# Patient Record
Sex: Male | Born: 1935 | Race: Black or African American | Hispanic: No | Smoking: Current every day smoker
Health system: Southern US, Community
[De-identification: ages and names within clinical notes are randomized; demographics above are authoritative.]

## PROBLEM LIST (undated history)

## (undated) DIAGNOSIS — E785 Hyperlipidemia, unspecified: Secondary | ICD-10-CM

## (undated) DIAGNOSIS — I739 Peripheral vascular disease, unspecified: Secondary | ICD-10-CM

## (undated) DIAGNOSIS — N183 Chronic kidney disease, stage 3 unspecified: Secondary | ICD-10-CM

## (undated) DIAGNOSIS — I951 Orthostatic hypotension: Secondary | ICD-10-CM

## (undated) DIAGNOSIS — E039 Hypothyroidism, unspecified: Secondary | ICD-10-CM

## (undated) DIAGNOSIS — J449 Chronic obstructive pulmonary disease, unspecified: Secondary | ICD-10-CM

## (undated) DIAGNOSIS — R55 Syncope and collapse: Secondary | ICD-10-CM

## (undated) DIAGNOSIS — R0989 Other specified symptoms and signs involving the circulatory and respiratory systems: Secondary | ICD-10-CM

## (undated) DIAGNOSIS — I714 Abdominal aortic aneurysm, without rupture, unspecified: Secondary | ICD-10-CM

## (undated) DIAGNOSIS — I10 Essential (primary) hypertension: Secondary | ICD-10-CM

## (undated) DIAGNOSIS — I2510 Atherosclerotic heart disease of native coronary artery without angina pectoris: Secondary | ICD-10-CM

## (undated) DIAGNOSIS — I639 Cerebral infarction, unspecified: Secondary | ICD-10-CM

## (undated) DIAGNOSIS — R519 Headache, unspecified: Secondary | ICD-10-CM

## (undated) DIAGNOSIS — I495 Sick sinus syndrome: Secondary | ICD-10-CM

## (undated) DIAGNOSIS — Z950 Presence of cardiac pacemaker: Secondary | ICD-10-CM

## (undated) DIAGNOSIS — J189 Pneumonia, unspecified organism: Secondary | ICD-10-CM

## (undated) DIAGNOSIS — R918 Other nonspecific abnormal finding of lung field: Secondary | ICD-10-CM

## (undated) DIAGNOSIS — G459 Transient cerebral ischemic attack, unspecified: Secondary | ICD-10-CM

## (undated) DIAGNOSIS — R51 Headache: Secondary | ICD-10-CM

## (undated) DIAGNOSIS — I5022 Chronic systolic (congestive) heart failure: Secondary | ICD-10-CM

## (undated) HISTORY — DX: Other specified symptoms and signs involving the circulatory and respiratory systems: R09.89

## (undated) HISTORY — DX: Hyperlipidemia, unspecified: E78.5

## (undated) HISTORY — PX: INSERT / REPLACE / REMOVE PACEMAKER: SUR710

## (undated) HISTORY — DX: Peripheral vascular disease, unspecified: I73.9

## (undated) HISTORY — PX: CATARACT EXTRACTION W/ INTRAOCULAR LENS  IMPLANT, BILATERAL: SHX1307

## (undated) HISTORY — DX: Chronic obstructive pulmonary disease, unspecified: J44.9

## (undated) HISTORY — DX: Syncope and collapse: R55

## (undated) HISTORY — DX: Sick sinus syndrome: I49.5

## (undated) HISTORY — PX: CORONARY ANGIOPLASTY WITH STENT PLACEMENT: SHX49

## (undated) HISTORY — DX: Cerebral infarction, unspecified: I63.9

## (undated) HISTORY — DX: Hypothyroidism, unspecified: E03.9

## (undated) HISTORY — DX: Abdominal aortic aneurysm, without rupture: I71.4

## (undated) HISTORY — PX: COLONOSCOPY: SHX174

## (undated) HISTORY — PX: OTHER SURGICAL HISTORY: SHX169

## (undated) HISTORY — PX: FEMORAL BYPASS: SHX50

## (undated) HISTORY — DX: Orthostatic hypotension: I95.1

## (undated) HISTORY — DX: Abdominal aortic aneurysm, without rupture, unspecified: I71.40

---

## 1979-08-07 HISTORY — PX: INGUINAL HERNIA REPAIR: SUR1180

## 1998-06-02 ENCOUNTER — Inpatient Hospital Stay (HOSPITAL_COMMUNITY): Admission: EM | Admit: 1998-06-02 | Discharge: 1998-06-04 | Payer: Self-pay | Admitting: Emergency Medicine

## 1998-12-06 HISTORY — PX: CARDIAC CATHETERIZATION: SHX172

## 1998-12-06 HISTORY — PX: CORONARY ARTERY BYPASS GRAFT: SHX141

## 1999-01-15 ENCOUNTER — Ambulatory Visit (HOSPITAL_COMMUNITY): Admission: RE | Admit: 1999-01-15 | Discharge: 1999-01-15 | Payer: Self-pay | Admitting: Cardiology

## 1999-01-20 ENCOUNTER — Encounter: Payer: Self-pay | Admitting: Thoracic Surgery (Cardiothoracic Vascular Surgery)

## 1999-01-20 ENCOUNTER — Inpatient Hospital Stay (HOSPITAL_COMMUNITY)
Admission: RE | Admit: 1999-01-20 | Discharge: 1999-01-24 | Payer: Self-pay | Admitting: Thoracic Surgery (Cardiothoracic Vascular Surgery)

## 1999-01-21 ENCOUNTER — Encounter: Payer: Self-pay | Admitting: Thoracic Surgery (Cardiothoracic Vascular Surgery)

## 1999-01-22 ENCOUNTER — Encounter: Payer: Self-pay | Admitting: Thoracic Surgery (Cardiothoracic Vascular Surgery)

## 1999-01-23 ENCOUNTER — Encounter: Payer: Self-pay | Admitting: Cardiology

## 1999-01-24 ENCOUNTER — Encounter: Payer: Self-pay | Admitting: Thoracic Surgery (Cardiothoracic Vascular Surgery)

## 2000-07-08 ENCOUNTER — Inpatient Hospital Stay (HOSPITAL_COMMUNITY): Admission: EM | Admit: 2000-07-08 | Discharge: 2000-07-12 | Payer: Self-pay | Admitting: Cardiovascular Disease

## 2000-07-08 ENCOUNTER — Encounter: Payer: Self-pay | Admitting: Cardiology

## 2000-07-15 ENCOUNTER — Ambulatory Visit (HOSPITAL_COMMUNITY): Admission: RE | Admit: 2000-07-15 | Discharge: 2000-07-15 | Payer: Self-pay | Admitting: Internal Medicine

## 2001-02-21 ENCOUNTER — Ambulatory Visit (HOSPITAL_COMMUNITY): Admission: RE | Admit: 2001-02-21 | Discharge: 2001-02-21 | Payer: Self-pay | Admitting: Cardiovascular Disease

## 2001-07-10 ENCOUNTER — Encounter: Admission: RE | Admit: 2001-07-10 | Discharge: 2001-07-10 | Payer: Self-pay | Admitting: Family Medicine

## 2001-07-10 ENCOUNTER — Encounter: Payer: Self-pay | Admitting: Family Medicine

## 2001-10-30 ENCOUNTER — Ambulatory Visit (HOSPITAL_COMMUNITY): Admission: RE | Admit: 2001-10-30 | Discharge: 2001-10-30 | Payer: Self-pay | Admitting: Internal Medicine

## 2001-10-30 ENCOUNTER — Encounter: Payer: Self-pay | Admitting: Internal Medicine

## 2002-11-14 ENCOUNTER — Encounter: Payer: Self-pay | Admitting: Emergency Medicine

## 2002-11-14 ENCOUNTER — Inpatient Hospital Stay (HOSPITAL_COMMUNITY): Admission: EM | Admit: 2002-11-14 | Discharge: 2002-11-20 | Payer: Self-pay | Admitting: Emergency Medicine

## 2002-11-15 ENCOUNTER — Encounter (INDEPENDENT_AMBULATORY_CARE_PROVIDER_SITE_OTHER): Payer: Self-pay | Admitting: *Deleted

## 2002-11-18 ENCOUNTER — Encounter: Payer: Self-pay | Admitting: Cardiology

## 2002-11-20 ENCOUNTER — Encounter: Payer: Self-pay | Admitting: Cardiology

## 2003-11-07 ENCOUNTER — Emergency Department (HOSPITAL_COMMUNITY): Admission: EM | Admit: 2003-11-07 | Discharge: 2003-11-07 | Payer: Self-pay | Admitting: Emergency Medicine

## 2003-11-20 ENCOUNTER — Encounter: Admission: RE | Admit: 2003-11-20 | Discharge: 2003-11-20 | Payer: Self-pay | Admitting: Cardiovascular Disease

## 2004-09-27 ENCOUNTER — Inpatient Hospital Stay (HOSPITAL_COMMUNITY): Admission: EM | Admit: 2004-09-27 | Discharge: 2004-09-30 | Payer: Self-pay | Admitting: Emergency Medicine

## 2005-11-28 ENCOUNTER — Emergency Department (HOSPITAL_COMMUNITY): Admission: EM | Admit: 2005-11-28 | Discharge: 2005-11-28 | Payer: Self-pay | Admitting: Emergency Medicine

## 2006-06-29 ENCOUNTER — Emergency Department (HOSPITAL_COMMUNITY): Admission: EM | Admit: 2006-06-29 | Discharge: 2006-06-29 | Payer: Self-pay | Admitting: Emergency Medicine

## 2006-10-12 ENCOUNTER — Emergency Department (HOSPITAL_COMMUNITY): Admission: EM | Admit: 2006-10-12 | Discharge: 2006-10-12 | Payer: Self-pay | Admitting: Emergency Medicine

## 2007-03-23 ENCOUNTER — Ambulatory Visit (HOSPITAL_COMMUNITY): Admission: RE | Admit: 2007-03-23 | Discharge: 2007-03-23 | Payer: Self-pay | Admitting: Gastroenterology

## 2007-03-23 ENCOUNTER — Encounter (INDEPENDENT_AMBULATORY_CARE_PROVIDER_SITE_OTHER): Payer: Self-pay | Admitting: Specialist

## 2007-08-04 ENCOUNTER — Emergency Department (HOSPITAL_COMMUNITY): Admission: EM | Admit: 2007-08-04 | Discharge: 2007-08-04 | Payer: Self-pay | Admitting: Emergency Medicine

## 2008-09-25 ENCOUNTER — Encounter: Admission: RE | Admit: 2008-09-25 | Discharge: 2008-09-25 | Payer: Self-pay | Admitting: Cardiology

## 2008-10-01 ENCOUNTER — Ambulatory Visit (HOSPITAL_COMMUNITY): Admission: RE | Admit: 2008-10-01 | Discharge: 2008-10-01 | Payer: Self-pay | Admitting: Cardiology

## 2008-11-08 ENCOUNTER — Ambulatory Visit: Payer: Self-pay | Admitting: *Deleted

## 2008-11-19 ENCOUNTER — Ambulatory Visit: Payer: Self-pay | Admitting: Vascular Surgery

## 2008-11-19 ENCOUNTER — Inpatient Hospital Stay (HOSPITAL_COMMUNITY): Admission: RE | Admit: 2008-11-19 | Discharge: 2008-11-20 | Payer: Self-pay | Admitting: Vascular Surgery

## 2008-12-13 ENCOUNTER — Ambulatory Visit: Payer: Self-pay | Admitting: Vascular Surgery

## 2009-01-22 ENCOUNTER — Encounter: Admission: RE | Admit: 2009-01-22 | Discharge: 2009-01-22 | Payer: Self-pay | Admitting: Family Medicine

## 2009-02-28 ENCOUNTER — Ambulatory Visit: Payer: Self-pay | Admitting: Vascular Surgery

## 2009-05-16 ENCOUNTER — Ambulatory Visit: Payer: Self-pay | Admitting: Vascular Surgery

## 2009-08-15 ENCOUNTER — Ambulatory Visit: Payer: Self-pay | Admitting: Vascular Surgery

## 2009-11-21 ENCOUNTER — Ambulatory Visit: Payer: Self-pay | Admitting: Vascular Surgery

## 2010-05-14 ENCOUNTER — Inpatient Hospital Stay (HOSPITAL_COMMUNITY): Admission: EM | Admit: 2010-05-14 | Discharge: 2010-05-16 | Payer: Self-pay | Admitting: Emergency Medicine

## 2010-05-29 ENCOUNTER — Ambulatory Visit: Payer: Self-pay | Admitting: Vascular Surgery

## 2010-06-04 ENCOUNTER — Encounter (HOSPITAL_COMMUNITY): Admission: RE | Admit: 2010-06-04 | Discharge: 2010-09-02 | Payer: Self-pay | Admitting: Cardiovascular Disease

## 2010-09-05 ENCOUNTER — Encounter (HOSPITAL_COMMUNITY)
Admission: RE | Admit: 2010-09-05 | Discharge: 2010-12-04 | Payer: Self-pay | Source: Home / Self Care | Attending: Cardiovascular Disease | Admitting: Cardiovascular Disease

## 2010-12-02 ENCOUNTER — Ambulatory Visit: Payer: Self-pay | Admitting: Vascular Surgery

## 2010-12-08 ENCOUNTER — Ambulatory Visit
Admission: RE | Admit: 2010-12-08 | Discharge: 2010-12-08 | Payer: Self-pay | Source: Home / Self Care | Attending: Vascular Surgery | Admitting: Vascular Surgery

## 2010-12-27 ENCOUNTER — Encounter: Payer: Self-pay | Admitting: Thoracic Surgery (Cardiothoracic Vascular Surgery)

## 2011-02-22 LAB — POCT I-STAT, CHEM 8
BUN: 11 mg/dL (ref 6–23)
Calcium, Ion: 1.11 mmol/L — ABNORMAL LOW (ref 1.12–1.32)
Chloride: 103 mEq/L (ref 96–112)
Creatinine, Ser: 1.1 mg/dL (ref 0.4–1.5)
Glucose, Bld: 93 mg/dL (ref 70–99)
HCT: 47 % (ref 39.0–52.0)
Hemoglobin: 16 g/dL (ref 13.0–17.0)
Potassium: 3.5 mEq/L (ref 3.5–5.1)
Sodium: 141 mEq/L (ref 135–145)
TCO2: 29 mmol/L (ref 0–100)

## 2011-02-22 LAB — URINE MICROSCOPIC-ADD ON

## 2011-02-22 LAB — HEPATIC FUNCTION PANEL
ALT: 10 U/L (ref 0–53)
AST: 20 U/L (ref 0–37)
Albumin: 3.2 g/dL — ABNORMAL LOW (ref 3.5–5.2)
Alkaline Phosphatase: 73 U/L (ref 39–117)
Bilirubin, Direct: 0.1 mg/dL (ref 0.0–0.3)
Indirect Bilirubin: 0.7 mg/dL (ref 0.3–0.9)
Total Bilirubin: 0.8 mg/dL (ref 0.3–1.2)
Total Protein: 6.3 g/dL (ref 6.0–8.3)

## 2011-02-22 LAB — CBC
HCT: 43.5 % (ref 39.0–52.0)
Hemoglobin: 13.6 g/dL (ref 13.0–17.0)
Hemoglobin: 14.7 g/dL (ref 13.0–17.0)
MCHC: 33.8 g/dL (ref 30.0–36.0)
MCV: 97.4 fL (ref 78.0–100.0)
MCV: 97.9 fL (ref 78.0–100.0)
Platelets: 124 10*3/uL — ABNORMAL LOW (ref 150–400)
Platelets: 129 10*3/uL — ABNORMAL LOW (ref 150–400)
RBC: 3.97 MIL/uL — ABNORMAL LOW (ref 4.22–5.81)
RBC: 4.19 MIL/uL — ABNORMAL LOW (ref 4.22–5.81)
RBC: 4.47 MIL/uL (ref 4.22–5.81)
RDW: 15.8 % — ABNORMAL HIGH (ref 11.5–15.5)
WBC: 7.9 10*3/uL (ref 4.0–10.5)
WBC: 9.4 10*3/uL (ref 4.0–10.5)
WBC: 9.6 10*3/uL (ref 4.0–10.5)

## 2011-02-22 LAB — CARDIAC PANEL(CRET KIN+CKTOT+MB+TROPI)
CK, MB: 13.3 ng/mL (ref 0.3–4.0)
CK, MB: 17.5 ng/mL (ref 0.3–4.0)
Relative Index: 7.9 — ABNORMAL HIGH (ref 0.0–2.5)
Relative Index: 8.5 — ABNORMAL HIGH (ref 0.0–2.5)
Total CK: 169 U/L (ref 7–232)
Troponin I: 6.29 ng/mL (ref 0.00–0.06)

## 2011-02-22 LAB — CK TOTAL AND CKMB (NOT AT ARMC)
CK, MB: 7.5 ng/mL (ref 0.3–4.0)
Relative Index: 6.4 — ABNORMAL HIGH (ref 0.0–2.5)
Total CK: 118 U/L (ref 7–232)

## 2011-02-22 LAB — POCT CARDIAC MARKERS
CKMB, poc: <1 ng/mL — ABNORMAL LOW (ref 1.0–8.0)
Myoglobin, poc: 118 ng/mL (ref 12–200)
Troponin i, poc: 0.07 ng/mL (ref 0.00–0.09)

## 2011-02-22 LAB — HEMOGLOBIN A1C
Hgb A1c MFr Bld: 6 % — ABNORMAL HIGH (ref <5.7)
Mean Plasma Glucose: 126 mg/dL — ABNORMAL HIGH (ref <117)

## 2011-02-22 LAB — DIFFERENTIAL
Basophils Absolute: 0.1 10*3/uL (ref 0.0–0.1)
Basophils Relative: 1 % (ref 0–1)
Eosinophils Absolute: 0.1 10*3/uL (ref 0.0–0.7)
Eosinophils Relative: 1 % (ref 0–5)
Lymphocytes Relative: 39 % (ref 12–46)
Lymphs Abs: 3.1 10*3/uL (ref 0.7–4.0)
Monocytes Absolute: 0.5 10*3/uL (ref 0.1–1.0)
Monocytes Relative: 6 % (ref 3–12)
Neutro Abs: 4.3 10*3/uL (ref 1.7–7.7)
Neutrophils Relative %: 54 % (ref 43–77)

## 2011-02-22 LAB — PROTIME-INR
INR: 1.14 (ref 0.00–1.49)
INR: 1.16 (ref 0.00–1.49)
Prothrombin Time: 14.5 seconds (ref 11.6–15.2)
Prothrombin Time: 14.7 seconds (ref 11.6–15.2)

## 2011-02-22 LAB — BASIC METABOLIC PANEL
BUN: 13 mg/dL (ref 6–23)
BUN: 15 mg/dL (ref 6–23)
Calcium: 8.5 mg/dL (ref 8.4–10.5)
Calcium: 8.6 mg/dL (ref 8.4–10.5)
Creatinine, Ser: 1.29 mg/dL (ref 0.4–1.5)
GFR calc Af Amer: >60 mL/min (ref >60)
GFR calc non Af Amer: 53 mL/min — ABNORMAL LOW (ref >60)
GFR calc non Af Amer: 58 mL/min — ABNORMAL LOW (ref >60)
Glucose, Bld: 95 mg/dL (ref 70–99)
Potassium: 3.6 mEq/L (ref 3.5–5.1)
Sodium: 137 mEq/L (ref 135–145)

## 2011-02-22 LAB — URINALYSIS, ROUTINE W REFLEX MICROSCOPIC
Bilirubin Urine: NEGATIVE
Glucose, UA: NEGATIVE mg/dL
Ketones, ur: NEGATIVE mg/dL
Leukocytes, UA: NEGATIVE
Nitrite: NEGATIVE
Protein, ur: NEGATIVE mg/dL
Specific Gravity, Urine: 1.009 (ref 1.005–1.030)
Urobilinogen, UA: 1 mg/dL (ref 0.0–1.0)
pH: 6.5 (ref 5.0–8.0)

## 2011-02-22 LAB — MRSA PCR SCREENING: MRSA by PCR: NEGATIVE

## 2011-02-22 LAB — BRAIN NATRIURETIC PEPTIDE: Pro B Natriuretic peptide (BNP): 770 pg/mL — ABNORMAL HIGH (ref 0.0–100.0)

## 2011-02-22 LAB — LIPID PANEL
Cholesterol: 178 mg/dL (ref 0–200)
HDL: 44 mg/dL (ref >39)

## 2011-02-22 LAB — APTT: aPTT: >200 seconds (ref 24–37)

## 2011-02-22 LAB — TSH: TSH: 1.392 u[IU]/mL (ref 0.350–4.500)

## 2011-02-22 LAB — HEPARIN LEVEL (UNFRACTIONATED)
Heparin Unfractionated: 0.13 IU/mL — ABNORMAL LOW (ref 0.30–0.70)
Heparin Unfractionated: 0.16 IU/mL — ABNORMAL LOW (ref 0.30–0.70)
Heparin Unfractionated: 0.29 IU/mL — ABNORMAL LOW (ref 0.30–0.70)
Heparin Unfractionated: <0.1 IU/mL — ABNORMAL LOW (ref 0.30–0.70)

## 2011-02-22 LAB — TROPONIN I: Troponin I: 0.26 ng/mL — ABNORMAL HIGH (ref 0.00–0.06)

## 2011-02-22 LAB — MAGNESIUM
Magnesium: 2.1 mg/dL (ref 1.5–2.5)
Magnesium: 2.2 mg/dL (ref 1.5–2.5)

## 2011-03-01 ENCOUNTER — Other Ambulatory Visit: Payer: Self-pay | Admitting: Internal Medicine

## 2011-03-01 ENCOUNTER — Encounter (HOSPITAL_BASED_OUTPATIENT_CLINIC_OR_DEPARTMENT_OTHER): Payer: Medicare Other | Admitting: Internal Medicine

## 2011-03-01 ENCOUNTER — Other Ambulatory Visit (HOSPITAL_COMMUNITY)
Admission: RE | Admit: 2011-03-01 | Discharge: 2011-03-01 | Disposition: A | Discharge status: Home / Self Care | Payer: Medicare Other | Source: Ambulatory Visit | Attending: Internal Medicine | Admitting: Internal Medicine

## 2011-03-01 ENCOUNTER — Encounter: Payer: Self-pay | Admitting: Internal Medicine

## 2011-03-01 DIAGNOSIS — D72820 Lymphocytosis (symptomatic): Secondary | ICD-10-CM

## 2011-03-01 LAB — COMPREHENSIVE METABOLIC PANEL
AST: 12 U/L (ref 0–37)
Albumin: 3.7 g/dL (ref 3.5–5.2)
Alkaline Phosphatase: 78 U/L (ref 39–117)
Chloride: 100 mEq/L (ref 96–112)
Glucose, Bld: 106 mg/dL — ABNORMAL HIGH (ref 70–99)
Potassium: 4 mEq/L (ref 3.5–5.3)
Sodium: 138 mEq/L (ref 135–145)
Total Protein: 7 g/dL (ref 6.0–8.3)

## 2011-03-01 LAB — CBC WITH DIFFERENTIAL/PLATELET
Eosinophils Absolute: 0.1 10*3/uL (ref 0.0–0.5)
MCV: 96.4 fL (ref 79.3–98.0)
MONO%: 4.3 % (ref 0.0–14.0)
NEUT#: 4.9 10*3/uL (ref 1.5–6.5)
RBC: 4.84 10*6/uL (ref 4.20–5.82)
RDW: 15.8 % — ABNORMAL HIGH (ref 11.0–14.6)
WBC: 9 10*3/uL (ref 4.0–10.3)
lymph#: 3.5 10*3/uL — ABNORMAL HIGH (ref 0.9–3.3)

## 2011-03-03 LAB — FLOW CYTOMETRY

## 2011-03-31 ENCOUNTER — Other Ambulatory Visit: Payer: Self-pay | Admitting: Internal Medicine

## 2011-03-31 ENCOUNTER — Encounter (HOSPITAL_BASED_OUTPATIENT_CLINIC_OR_DEPARTMENT_OTHER): Payer: Medicare Other | Admitting: Internal Medicine

## 2011-03-31 DIAGNOSIS — D72820 Lymphocytosis (symptomatic): Secondary | ICD-10-CM

## 2011-03-31 LAB — CBC WITH DIFFERENTIAL/PLATELET
BASO%: 0.6 % (ref 0.0–2.0)
EOS%: 1.7 % (ref 0.0–7.0)
HCT: 44.5 % (ref 38.4–49.9)
LYMPH%: 51.3 % — ABNORMAL HIGH (ref 14.0–49.0)
MCH: 32.4 pg (ref 27.2–33.4)
MCHC: 33.3 g/dL (ref 32.0–36.0)
MONO%: 4.8 % (ref 0.0–14.0)
NEUT%: 41.6 % (ref 39.0–75.0)
Platelets: 157 10*3/uL (ref 140–400)

## 2011-04-20 NOTE — Procedures (Signed)
BYPASS GRAFT EVALUATION   INDICATION:  Right lower extremity bypass graft.   HISTORY:  Diabetes:  No.  Cardiac:  No.  Hypertension:  Yes.  Smoking:  Yes.  Previous Surgery:  Right fem-pop bypass graft on 11/19/2008.   SINGLE LEVEL ARTERIAL EXAM                               RIGHT              LEFT  Brachial:                    166                162  Anterior tibial:             132                113  Posterior tibial:            179                119  Peroneal:  Ankle/brachial index:        1.08               0.72   PREVIOUS ABI:  Date:  05/16/2009  RIGHT:  1.01  LEFT:  0.58   LOWER EXTREMITY BYPASS GRAFT DUPLEX EXAM:   DUPLEX:  Biphasic Doppler waveforms noted throughout the right lower  extremity bypass graft and its native vessels with no evidence of  increased velocities that were noted during the previous exam.   IMPRESSION:  1. Patent right femoral-popliteal bypass graft with no evidence of      stenosis.  2. Stable bilateral ankle brachial indices.   ___________________________________________  Larina Earthly, M.D.   CH/MEDQ  D:  08/15/2009  T:  08/16/2009  Job:  [id number]

## 2011-04-20 NOTE — Op Note (Signed)
NAMEPOSEIDON, PAM                ACCOUNT NO.:  [id number]   MEDICAL RECORD NO.:  [id number]          PATIENT TYPE:  INP   LOCATION:  3310                         FACILITY:  [facility]   PHYSICIAN:  Larina Earthly, M.D.    DATE OF BIRTH:  03-Jan-1927   DATE OF PROCEDURE:  11/19/2008  DATE OF DISCHARGE:                               OPERATIVE REPORT   PREOPERATIVE DIAGNOSIS:  Severe right leg claudication with superficial  femoral artery occlusion.   POSTOPERATIVE DIAGNOSIS:  Severe right leg claudication with superficial  femoral artery occlusion.   PROCEDURE:  Right femoral to above-knee popliteal bypass with 6-mm  PROPATEN graft.   SURGEON:  Larina Earthly, MD   ASSISTANT:  Wilmon Arms, PA-C   ANESTHESIA:  General endotracheal.   COMPLICATIONS:  None.   DISPOSITION:  To recovery room, stable.   PROCEDURE IN DETAIL:  The patient was taken to the operating room,  placed in supine position.  The area of the right groin and right leg  were prepped and draped in usual sterile fashion.  Incision was made  over the femoral pulse, carried down to isolate the common femoral  artery at the level of the inguinal ligament, was of slight ectasia, but  no evidence of severe plaque.  Next, a separate incision was made  through a prior vein harvest from coronary bypass grafting in the medial  aspect of the above-knee popliteal.  This was carried through the  subcutaneous fascia to the level of popliteal artery.  There was a  moderate thickening, but this was acceptable for anastomosis.  A tunnel  was created between the level of the above-knee popliteal artery and the  groin.  The patient was given 7000 units of intravenous heparin.  After  adequate circulation time, the common femoral artery was occluded  proximally and distally and was opened with 11 blade and extended  longitudinally with Potts scissors.  The 6-mm PROPATEN graft was brought  into the field, was spatulated, and sewn  end-to-side to the artery with  a running 6-0 Prolene suture.  The anastomosis was tested and found to  be adequate, and clamps were placed on the graft and removed from the  common femoral artery.  The graft was flushed with heparinized saline  and reoccluded.  Next, the graft was passed through the prior created  tunnel to the level of the above-knee popliteal artery.  The above-knee  popliteal artery was occluded proximally and distally and was opened  with 11 blade and extended longitudinally with Potts scissors.  The  graft was cut to the appropriate length, spatulated, and sewn end-to-  side to the artery with a running 6-0 Prolene suture.  Prior to  completion of the anastomosis, the usual flushing maneuvers were  undertaken.  Anastomosis was completed, and flow was restored to the  leg.  Graft-dependent Doppler flow was noted in the anterior tibial and  posterior tibial artery.  The patient was given 100 mg of protamine to  reverse the heparin.  Wounds were irrigated with saline.  Hemostasis  with electrocautery.  Wounds were closed with 3-0 Vicryl in the  subcutaneous and subcuticular tissue.  Sterile dressing was applied.       Larina Earthly, M.D.  Electronically Signed     TFE/MEDQ  D:  11/19/2008  T:  11/20/2008  Job:  [id number]   cc:   Cristy Hilts. Jacinto Halim, MD

## 2011-04-20 NOTE — Procedures (Signed)
BYPASS GRAFT EVALUATION   INDICATION:  Followup right lower extremity bypass graft   HISTORY:  Diabetes:  no  Cardiac:  MI  Hypertension:  yes  Smoking:  current  Previous Surgery:  Right femoral popliteal bypass graft on 11/19/2008   SINGLE LEVEL ARTERIAL EXAM                               RIGHT              LEFT  Brachial:                    183                189  Anterior tibial:             179                85  Posterior tibial:            180                103  Peroneal:  Ankle/brachial index:        0.95               0.54   PREVIOUS ABI:  Date: 11/21/2009  RIGHT:  1.01  LEFT:  0.53   LOWER EXTREMITY BYPASS GRAFT DUPLEX EXAM:   DUPLEX:  Patent right femoral popliteal bypass graft with biphasic flow  proximal, within and distal to graft with no areas of stenosis or  narrowing noted.   IMPRESSION:  1. Ankle brachial indices are stable from previous exams.  2. Patent right femoral popliteal bypass graft with no evidence of      stenosis or narrowing.   ___________________________________________  Larina Earthly, M.D.   NT/MEDQ  D:  05/29/2010  T:  05/29/2010  Job:  [id number]

## 2011-04-20 NOTE — Procedures (Signed)
BYPASS GRAFT EVALUATION   INDICATION:  Follow up right lower extremity bypass graft.   HISTORY:  Diabetes:  No.  Cardiac:  MI.  Hypertension:  Yes.  Smoking:  Current.  Previous Surgery:  Right fem-pop bypass graft on 11/19/2008.   SINGLE LEVEL ARTERIAL EXAM                               RIGHT              LEFT  Brachial:                    140                141  Anterior tibial:             145                97  Posterior tibial:            147                90  Peroneal:  Ankle/brachial index:        1.04               0.69   PREVIOUS ABI:  Date: 05/29/2010  RIGHT:  0.95  LEFT:  0.54   LOWER EXTREMITY BYPASS GRAFT DUPLEX EXAM:   DUPLEX:  Biphasic Doppler waveforms noted throughout the right lower  extremity bypass graft in its native vessels with no increase in  velocities.   IMPRESSION:  1. Patent right femoropopliteal bypass graft with no evidence of      stenosis.  2. Bilateral ankle brachial indices and Doppler waveforms suggest      normal perfusion of the right lower extremity and moderately      decreased perfusion of the left lower extremity.  The bilateral      ankle brachial indices appear stable when compared to the previous      examination.   ___________________________________________  Larina Earthly, M.D.   CH/MEDQ  D:  12/03/2010  T:  12/03/2010  Job:  [id number]

## 2011-04-20 NOTE — H&P (Signed)
HISTORY AND PHYSICAL EXAMINATION   November 08, 2008   Re:  [place], [place]                  DOB:  1927/08/03   DATE OF VISIT:  November 08, 2008.   The patient presents today for evaluation of progressive lower extremity  arterial insufficiency and claudication.  He is a very pleasant 77-year-  old black male with progressive lower extremity arterial insufficiency.  He had been seen by Dr. Yates Decamp and had undergone outpatient  arteriography for further evaluation of this and I do had this for  review from October 01, 2008.  He is quite active at his age of 68.  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  He reports that he is unable to do this due to right leg claudication.  He has a much lesser degree of difficulty in his left leg.   PAST HISTORY:  Significant for hypertension.  He does not have any  history of rest pain or tissue loss.   SOCIAL HISTORY:  He is married with 4 children.  He is retired.  He does  continue to smoke cigarettes and does not drink alcohol.  He does have a  prior history of coronary artery bypass grafting in 2000 and reports no  difficulty from an ischemic standpoint since that time, he also has a  permanent pacemaker for sick sinus syndrome.   REVIEW OF SYSTEMS:  Positive only for leg pain.  He does have a history  of depression and nervousness in the past.   MEDICATION ALLERGIES:  1. Zocor.  2. Pravachol.   CURRENT MEDICATIONS:  1. Aspirin 81 mg daily.  2. Hydrochlorothiazide 12.5 mg daily.  3. Amlodipine 10 mg p.o. daily.  4. Cardizem CD 80 mg p.o. daily.  5. Metoprolol 50 mg daily.  6. Plavix 75 mg daily.  7. Pletal 100 mg p.o. twice daily.  8. Lisinopril 20 mg p.o. nightly.  9. Crestor 5 mg p.o. daily.   PHYSICAL EXAMINATION:  General:  A well-developed well-nourished black  male appearing younger than stated age of 68.  Vital Signs;  His blood  pressure is 195/85, pulse 60,  respirations 18.  His carotid arteries  without bruits bilaterally.  He has 2+ radial and 2+ femoral pulses  bilaterally.  He has absent popliteal and distal pulses bilaterally, he  has no tissue loss in his feet.  I have reviewed his arteriogram with  the patient.  He does have complete occlusion of his superficial femoral  artery from the junction to the level of the above-knee popliteal  artery.  He has bilateral disease.  He also has some narrowing of his  profunda femoris artery.  I have recommended right femoral-to-popliteal  bypass for relief of his claudication symptoms.  He has had harvest of  his saphenous vein bilaterally for his prior coronary artery bypass  grafting.  Since he has an above-knee target, I have recommended that we  proceed with prosthetic grafting.  I did explain that there is a lower  long-term patency than with saphenous vein, but his saphenous vein has  been harvested.  I would recommend above-knee prosthetic graft versus  alternative vein.  He wishes to proceed with surgery as soon as  possible.  He has been scheduled for right fem-pop bypass on 11/19/2008  at [facility].   Larina Earthly, M.D.  Electronically Signed   TFE/MEDQ  D:  11/08/2008  T:  11/11/2008  Job:  [id number]   cc:   Cristy Hilts. Jacinto Halim, MD  Marjory Lies, M.D.

## 2011-04-20 NOTE — Assessment & Plan Note (Signed)
OFFICE VISIT   SQUARE, JOWETT  DOB:  Jan 07, 1927                                       12/08/2010  [id number]#:[id number]   The patient presents today for evaluation of chronic swelling in his  right leg.  The patient is well known to me from a prior right femoral  to above knee popliteal bypass with Gore-Tex graft for severely limiting  claudication on 11/19/2008.  He continues to do quite well.  He is  extremely active at his age of 51.  He reports that he has 5 sisters and  is kept quite busy helping them with home maintenance and also with  providing rides for his grandchildren who are active in sports and other  school activities.  His concern is of continual swelling in his lower  extremity.  He does not have any recurrent claudication symptoms.   PAST MEDICAL HISTORY:  Significant for prior myocardial infarction and  hypertension.  He does continue to smoke cigarettes.  He is not a  diabetic.   PHYSICAL EXAM:  General:  A well-developed, well-nourished black male  appearing younger than stated age of 61.  Vital signs:  Blood pressure  is 163/74, pulse 86, respirations 18.  HEENT:  Normal.  His radial and  femoral pulses are 2+.  He has palpable right popliteal pulse.  I do not  palpate left popliteal or distal pulses.  He does have chronic edema in  his right leg from his mid calf down onto his ankle.  He reports this is  worse with prolonged standing.  He did wear compression garments in the  past but had difficulty placing these on and off and had more discomfort  with compression.   He underwent noninvasive vascular laboratory studies on 12/02/2010.  I  reviewed this with him.  This shows a normal ankle arm index on the  right at 1.04, moderately depressed on the left at 0.69.  His graft scan  shows widely patent graft on the right.  I explained the significance of  his chronic swelling and explained there is no specific treatment other  than  elevation and compression.  He reports that he is having no pain  associated with this and was just concerned regarding the swelling.  I  told him that since it has been persistent for 2 years that in all  likelihood will continue to be persistent.  We would recommend no  specific treatment.  I did explain that use of compression garments  would relieve the swelling but he feels this would be worse than the  swelling itself.  He will follow in noninvasive vascular lab protocol.     Larina Earthly, M.D.  Electronically Signed   TFE/MEDQ  D:  12/08/2010  T:  12/08/2010  Job:  [id number]

## 2011-04-20 NOTE — Cardiovascular Report (Signed)
[name], [name]                ACCOUNT NO.:  [id number]   MEDICAL RECORD NO.:  [id number]          PATIENT TYPE:  AMB   LOCATION:  SDS                          FACILITY:  [facility]   PHYSICIAN:  Vonna Kotyk R. Jacinto Halim, MD       DATE OF BIRTH:  30-Dec-1926   DATE OF PROCEDURE:  10/01/2008  DATE OF DISCHARGE:                            CARDIAC CATHETERIZATION   PROCEDURES PERFORMED:  1. Abdominal aortogram.  2. Abdominal aortogram with bifemoral runoff.  3. Crossover from the left common femoral artery into the right      external iliac artery and placement of a catheter into the right      external iliac artery for hemodynamic monitoring and angiography.   INDICATIONS:  [name] is a 77 year old fairly active gentleman  with known coronary artery disease and CABG in year 2000.  He also has  hyperlipidemia and has a history of tobacco use.  He has been  complaining of lifestyle-limiting claudication.  His ABIs have been 0.5  bilaterally.  He is brought to the catheterization lab to evaluate his  peripheral anatomy given his lifestyle-limiting claudication.   Abdominal aortogram:  Abdominal aortogram revealed mild to moderate  amount of calcification with the infrarenal abdominal aorta showing a 20-  30% stenoses.  The infrarenal abdominal aorta before the bifurcation  into the iliac arteries does show mild and a small aneurysmal formation.  Again, mild calcification is noted.  The aortoiliac bifurcation is  widely patent, again showing mild calcification and ectasia of the iliac  arteries.   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  The external iliac artery is tortuous and calcified.  There is moderate  amount of atherosclerotic changes are noted in the external iliac  artery.  There was no pressure gradient with a 5-French end-hole  catheter pullback across the iliac artery stenosis.   In the similar fashion, the right iliac artery also shows  moderate  amount of calcific stenosis constituting 40-50% stenosis.  It is  tortuous.  There was no significant pressure gradient noted across the  pullback across the external iliac artery into the internal iliac  arteries by pullback 5-French end-hole catheter.   Both the internal iliac artery showed a high-grade 80-90% stenoses.   The bilateral femoral runoff:  The bilateral femoral runoff revealed  bilateral occlusion of the superficial femoral arteries right at its  origin.  The profunda femoral artery has 80-90% stenosis bilaterally.   The profunda gives collaterals and the SFA reconstitutes just outside of  the [facility] canal.   Below the knee, there are two-vessel runoff in the form of tibioperoneal  trunk, the flow is very brisk and the peroneal artery and posterior  tibial artery are widely patent.   The left and right anterior tibial arteries are occluded and they  reconstitute at the level of the ankle.   IMPRESSION:  Severe long segment bilateral disease of the superficial  femoral artery without any significant iliac disease for common or  external iliac artery.  There is severe bilateral high-grade internal  iliac artery stenosis.   Small abdominal aortic aneurysm.  The renal arteries are widely patent  without any evidence of renal artery stenosis.   There is two-vessel runoff noted below the knee and the anterior  tibialis occluded bilaterally.   RECOMMENDATIONS:  The patient will benefit from femoropopliteal bypass  surgery bilaterally.   A total of 100 mL of contrast was utilized for diagnostic angiography.   TECHNIQUE OF PROCEDURE:  Under usual sterile precautions using a 5-  French left femoral arterial access, a 5-French Omni flush catheter was  advanced into the abdominal aorta.  Abdominal aortogram was performed.  Using the same catheter, the catheter was pulled back in the distal  abdominal aorta and pelvic aortogram was performed.  Using the Omni   flush catheter, the right common iliac artery was selectively cannulated  and using  a  Versacore wire, I was able to cross into the right  external iliac artery.  A 5-French end-hole catheter was advanced into  the external iliac artery.  Careful pullback was performed.  There was  no pressure gradient noted across the iliac arterial system on the  right.  The same pullback was performed through the left external iliac  artery from the common into the external iliac artery into the femoral  artery and again no pressure gradient was noted.  The catheter was then  pulled out of the body.  The patient tolerated the procedure well.  There was no immediate complication noted.      Cristy Hilts. Jacinto Halim, MD  Electronically Signed     JRG/MEDQ  D:  10/01/2008  T:  10/01/2008  Job:  [id number]   cc:   Madaline Savage, M.D.

## 2011-04-20 NOTE — Procedures (Signed)
BYPASS GRAFT EVALUATION   INDICATION:  Follow up right lower extremity bypass graft.   HISTORY:  Diabetes:  No.  Cardiac:  No.  Hypertension:  Yes.  Smoking:  Quit.  Previous Surgery:  Right femoropopliteal artery bypass graft, 11/19/08,  by Dr. [name].   SINGLE LEVEL ARTERIAL EXAM                               RIGHT              LEFT  Brachial:                    182                193  Anterior tibial:             176                104  Posterior tibial:            166                102  Peroneal:  Ankle/brachial index:        0.91               0.54   PREVIOUS ABI:  Date: 12/13/08  RIGHT:  0.92  LEFT:  0.48   LOWER EXTREMITY BYPASS GRAFT DUPLEX EXAM:   DUPLEX:  1. Doppler arterial waveforms appear biphasic proximal to, within, and      distal to bypass graft.  2. Slightly elevated velocities at the proximal anastomosis and in      native distal to graft.   IMPRESSION:  1. Patent right femoral-popliteal artery bypass graft.  2. Slightly elevated velocities at the proximal anastomosis and in      native distal to graft.  3. Bilateral ankle brachial indices appear stable from previous study.   ___________________________________________  Larina Earthly, M.D.   AS/MEDQ  D:  02/28/2009  T:  02/28/2009  Job:  [id number]

## 2011-04-20 NOTE — Discharge Summary (Signed)
[name], Louis                ACCOUNT NO.:  [id number]   MEDICAL RECORD NO.:  [id number]          PATIENT TYPE:  INP   LOCATION:  3310                         FACILITY:  [facility]   PHYSICIAN:  Larina Earthly, M.D.    DATE OF BIRTH:  October 23, 1927   DATE OF ADMISSION:  11/19/2008  DATE OF DISCHARGE:  11/20/2008                               DISCHARGE SUMMARY   DISCHARGE DIAGNOSES:  1. Right lower extremity ischemia.  2. Dyslipidemia.  3. Hypertension.   PROCEDURES PERFORMED:  Right femoral to above-knee popliteal bypass  grafting using 6 x 10 mm Propaten graft material by Dr. [name], November 19, 2008.   COMPLICATIONS:  None.   CONDITION ON DISCHARGE:  Stable, improving.   DISCHARGE MEDICATIONS:  1. Trazodone 100 mg p.o. nightly p.r.n.  2. Ibuprofen 600 mg p.o. q.i.d. p.r.n.  3. Dicyclomine 20 mg p.o. b.i.d.  4. Hydrochlorothiazide 25 mg p.o. daily.  5. Aspirin 81 mg p.o. daily.  6. Metoprolol succinate 50 mg one-half tablet p.o. daily.  7. Flomax 0.4 mg p.o. daily.  8. He is given a prescription for Percocet 5/325 one p.o. q.4 h.      p.r.n. pain, total number of 30 were given.   DISPOSITION:  He has been discharged home in stable condition with his  wound healing well.  He is given careful instructions regarding the care  of his wounds and his activity level.  He is to see Dr. [name] in 2 weeks  for ABIs.  The office will arrange the visit.   BRIEF IDENTIFYING STATEMENT:  For complete details, please refer to the  typed history and physical.  Briefly, this is a very pleasant 77-year-  old gentleman who was referred to Dr. [name] for right lower extremity  ischemia.  Dr. [name] recommended right femoral to above-knee popliteal  bypass grafting.  He was informed of the risks and benefits of the  procedure and after careful consideration, he elected to proceed with  surgery.  He was admitted for surgery.   HOSPITAL COURSE:  Preoperative workup was completed as an outpatient.  He was brought in through same-day surgery and underwent the  aforementioned revascularization procedure.  For complete details,  please refer the typed operative report.  The procedure was without  complications.  He was returned to the Postanesthesia Care Unit,  extubated.  Following stabilization, he was transferred to a bed on a  surgical step-down unit.  He was observed overnight.  Following morning,  he was found to be stable.  His wounds are healing well.  He desires a  discharge and he was subsequently discharged home in stable condition.      Wilmon Arms, PA      Larina Earthly, M.D.  Electronically Signed    KEL/MEDQ  D:  11/20/2008  T:  11/20/2008  Job:  [id number]

## 2011-04-20 NOTE — Assessment & Plan Note (Signed)
OFFICE VISIT   CORTLIN, MARANO  DOB:  01-May-1927                                       12/13/2008  ZOXWR#:[id number]   [name] presents today for follow-up of his recent right fem-pop  bypass for limiting claudication.  He did well on the hospital following  his surgery on November 19, 2008 and was discharged home several days  following the procedure.  He has done quite well since his discharge  from the hospital and is walking without difficulty.  He does have the  usual amount of postoperative swelling.  His groin and popliteal  incisions are well-healed.  He has 2+ popliteal and 2+ posterior tibial  pulses.  He is instructed to resume full activity without limitation.  We will plan to see him again in 3 months for continued  vascular  protocol follow-up.   Larina Earthly, M.D.  Electronically Signed   TFE/MEDQ  D:  12/13/2008  T:  12/17/2008  Job:  [id number]   cc:   Marjory Lies, M.D.  Cristy Hilts. Jacinto Halim, MD

## 2011-04-20 NOTE — Procedures (Signed)
BYPASS GRAFT EVALUATION   INDICATION:  Followup right femoral-popliteal bypass graft.   HISTORY:  Diabetes:  No.  Cardiac:  No.  Hypertension:  Yes.  Smoking:  Yes.  Previous Surgery:  Right femoral-popliteal bypass graft on 11/19/2008.   SINGLE LEVEL ARTERIAL EXAM                               RIGHT              LEFT  Brachial:                    153                144  Anterior tibial:             155                81  Posterior tibial:            152                81  Peroneal:  Ankle/brachial index:        1.01               0.53   PREVIOUS ABI:  Date:  08/15/2009  RIGHT:  1.08  LEFT:  0.72   LOWER EXTREMITY BYPASS GRAFT DUPLEX EXAM:   DUPLEX:  Patent right femoral-popliteal bypass graft with biphasic  waveforms proximal, within and distal to graft.   IMPRESSION:  1. Stable ankle brachial indices in comparison to previous study.  2. Patent right femoral-popliteal bypass graft with no focal stenosis      noted.   ___________________________________________  Larina Earthly, M.D.   CB/MEDQ  D:  11/21/2009  T:  11/22/2009  Job:  [id number]

## 2011-04-20 NOTE — Procedures (Signed)
BYPASS GRAFT EVALUATION   INDICATION:  Follow-up evaluation of lower extremity bypass graft.   HISTORY:  Diabetes:  No.  Cardiac:  No.  Hypertension:  Yes.  Smoking:  Yes.  Previous Surgery:  Right fem-pop bypass graft on 11/19/08 by Dr. [name].   SINGLE LEVEL ARTERIAL EXAM                               RIGHT              LEFT  Brachial:                    195                204  Anterior tibial:             199                116  Posterior tibial:            206                119  Peroneal:  Ankle/brachial index:        1.01               0.58   PREVIOUS ABI:  Date: 02/28/09  RIGHT:  0.91  LEFT:  0.54   LOWER EXTREMITY BYPASS GRAFT DUPLEX EXAM:   DUPLEX:  1. Increased velocity of 253 cm/s noted at the proximal right fem-pop      anastomosis and 246 cm/s noted at the native artery distal to      bypass graft.  2. Biphasic duplex waveform noted within graft and native artery.   IMPRESSION:  1. Patent right femoropopliteal bypass graft with no evidence of focal      stenosis.  2. Normal right lower extremity ankle brachial index with triphasic      and biphasic Doppler waveforms.  3. Left lower extremity ankle brachial index suggests moderate to      severe arterial disease with low resistant biphasic Doppler      waveforms.       ___________________________________________  Larina Earthly, M.D.   AC/MEDQ  D:  05/16/2009  T:  05/16/2009  Job:  [id number]

## 2011-04-23 NOTE — Consult Note (Signed)
[name], [name]                            ACCOUNT NO.:  [id number]   MEDICAL RECORD NO.:  [id number]                   PATIENT TYPE:  INP   LOCATION:  3701                                 FACILITY:  [facility]   PHYSICIAN:  Michael L. Thad Ranger, M.D.           DATE OF BIRTH:  05-07-1927   DATE OF CONSULTATION:  DATE OF DISCHARGE:                                   CONSULTATION   REASON FOR EVALUATION:  Syncope.   HISTORY OF PRESENT ILLNESS:  This will be initial inpatient  consultation/evaluation of this 77 year old man with a past medical history  of heart disease, who has had a couple of episodes of syncope over the past  week.  The patient had one of these several nights ago which was un-  witnessed in which he fell backwards and work up holding the phone as he was  when he experienced the onset of the sensation.  The most recent one, which  precipitated this admission, occurred yesterday when he was standing and  talking with his daughter.  He says that the episodes begin with a sensation  of feeling warm all over.  He will get vaguely light-headed although not  vertiginous.  He has no experience of vision change or any weakness or  paresthesias in any extremity or any speech arrest.  He then falls to the  floor, his daughter witnessed an episode in which he fell directly  backwards.  He did not have any associated tongue trauma, bowel or bladder  incontinence or major motor activity.  He did hit his head with the fall and  was unconscious for a few minutes, long enough for the EMS to arrive;  however, upon wakening, he did awaken clear, and was neurologically intact.  He remembers being taken away by the EMS and was able to see normally, speak  normally and move all the extremities well.  He was brought to the emergency  room where he was found to have a heart rate in the 40s, he has been  maintained here on Telemetry monitoring and his Beta blockers have been  held.  He ha had  no further episodes here in the hospital.  He did have some  episodes of syncope back in 2001 at which time he underwent evaluation  including implantable loop recorder, but the episodes subsequently stopped.   PAST MEDICAL HISTORY:  Remarkable for previous syncope as above.  He also  has coronary artery disease with history of CABG.  He says he has had a  stroke in the past which left him a little bit numb on the inside of his  right face and the tips of his right fingers.   FAMILY HISTORY:  Father died at 68 of lung cancer, mother is alive at 76 and  lives alone.   SOCIAL HISTORY:  He is married, he smokes a half pack of cigarettes a day.  He is normally independent in his activities of daily living.   REVIEW OF SYSTEMS:  He denies any associated chest pain, shortness of breath  or focal neurological symptomatology prior to his spells of loss of  consciousness.  Otherwise, as per HPI and admission H&P.   MEDICATIONS ON ADMISSION:  He was taking Toprol, Lasix, Pletal, Lipitor,  Altace, Norvasc and aspirin.  He presently continues on all his medications  except for the Toprol.   ALLERGIES:  No known drug allergies.   PHYSICAL EXAMINATION:  VITAL SIGNS:  Vital signs revealed a temperature of  97.3, blood pressure 145/55, pulse 45, respirations 20.  GENERAL:  This is a healthy-appearing male in no evident distress.  HEAD AND NECK:  Cranium is normocephalic and atraumatic.  Oropharynx is  benign.  Neck is supple without carotid or supraclavicular bruits.  HEART:  Regular rate and rhythm with a very soft systolic ejection murmur at  the right upper sternal border.  NEUROLOGICAL EXAM:  Mental status is awake, alert and completely oriented.  Speech is fluent, not dysarthric.  Mood is euthymic, affect appropriate.  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  Extraocular movements are normal, without nystagmus.  Face,  tongue and palate move normally  and symmetrically.  Visual fields full to  confrontation.  Motor; normal bulk and tone, normal strength in all tested  extremity muscles.  Sensation is intact to pin prick in all extremities.  Reflexes are 1+ and symmetric, toes are downgoing.  Finger-to-nose is  performed well.   IMPRESSION:  Syncope due to bradycardia, no evidence of neurological events.   RECOMMENDATIONS:  Agree with holding Beta blockers in consideration of  pacemaker.  He requires no further neurological work up at this time.   Thank you for the consultation.                                               Michael L. Thad Ranger, M.D.    MLR/MEDQ  D:  11/15/2002  T:  11/16/2002  Job:  [id number]

## 2011-04-23 NOTE — Cardiovascular Report (Signed)
NAMEMUATH, HALLAM NO.:  [id number]   MEDICAL RECORD NO.:  [id number]          PATIENT TYPE:  INP   LOCATION:  2907                         FACILITY:  [facility]   PHYSICIAN:  Nanetta Batty, M.D.   DATE OF BIRTH:  [birth year]   DATE OF PROCEDURE:  DATE OF DISCHARGE:                              CARDIAC CATHETERIZATION   CLINICAL HISTORY:  Mr. Loera is a 77 year old African-American male patient  of Dr. Pierre Bali.  He has history of CAD status post bypass grafting  February of 2000.  He was catheterized December 2003 and found to have  patent grafts.  He has also had syncope with implantation of a permanent  transvenous pacemaker by Dr. Lenise Herald.  His other problems include  hypertension and PVOD.  He is complaining of chest pain.  He is admitted  October 22, ruled out for myocardial infarction.  He presents now for  diagnostic coronary arteriography.   PROCEDURE:  The patient was brought to the second floor [facility] in the postabsorptive state.  He was premedicated  with p.o. Valium.  His right groin was prepped and shaved in the usual  sterile fashion.  1% Xylocaine was used for local anesthesia.  A 6-French  sheath was inserted into the right femoral artery using standard Seldinger  technique.  6-French right and left Judkins diagnostic catheter along with 6-  French pigtail catheter and a 6-French right bypass catheter were used for  selective coronary angiography, left ventriculography, supravalvular  aortography, distal abdominal aortography, selective vein graft angiography,  and selective IMA angiography.  Visipaque dye was used for the entirety of  the case.  Retrograde aortic, left ventricular, and pullback pressures were  recorded.   HEMODYNAMICS:  1.  Aortic systolic pressure 184, diastolic 73.  2.  Left ventricular systolic pressure 176, end-diastolic pressure 26.   SELECTIVE CORONARY ANGIOGRAPHY:  Left  main:  Left main had approximately 30%  distal taper, but otherwise was widely patent.   LAD:  The LAD was occluded in its mid portion after second diagonal and  first septal perforator.   Left circumflex:  This vessel had an occluded second marginal branch in its  mid portion.   Right coronary artery:  This vessel was dominant and occluded at the genu.   Vein graft to the obtuse marginal branch:  Widely patent with diffusely  diseased distal marginal vessel after vein graft insertion.   Vein graft to the diagonal branch:  Widely patent with approximately 60%  stenosis in the diagonal after insertion.   LIMA to the LAD:  Widely patent.   Vein graft to the PDA:  Widely patent with 60% ostial lesion just above a  venous valve.  It should be noted that the vein grafts appear to be patulous  and aneurysmal.   LEFT VENTRICULOGRAM:  RAO left ventriculogram was performed using 25 mL of  Visipaque dye at 12 mL/second.  The overall LV EF was estimated between 45-  50% with moderate inferior hypokinesia.   SUPRAVALVULAR AORTOGRAPHY:  Supravalvular aortogram was performed in the LAO  view using 25 mL of Visipaque dye at 20 mL/second.  The arch vessels were  all intact.  There was no evidence of aortic dissection.   DISTAL ABDOMINAL AORTOGRAPHY:  Distal abdominal aortogram was performed  using 20 mL of Visipaque dye at 20 mL/second.  The renal arteries were  widely patent.  The infrarenal abdominal aorta was mildly atherosclerotic in  the iliac bifurcation.  ___________ atherosclerotic changes.   IMPRESSION:  Mr. Kines has patent grafts with what appears to be a 60-70%  hypodense lesion just after the aorto-ostial location of the posterior  descending artery graft.  The grafts all appear to be aneurysmal in nature.  There is no evidence of aortic dissection.  An ACT is measured and the  sheaths were removed.  Pressure was held on the groin to achieve hemostasis.  Patient left the  laboratory in stable condition.  Heparin will be restarted  in four hours.  I will review the angiograms with my partners, although I  think at this point medical therapy would be bet.  It is possible that the  near ostial posterior descending artery saphenous vein graft lesion may be  hemodynamically significant and may benefit from IVUS interrogation.  The  patient left the laboratory in stable condition.       JB/MEDQ  D:  09/28/2004  T:  09/28/2004  Job:  [id number]   cc:   Cath Lab   [facility]  [address]  [place], [place] [address]

## 2011-04-23 NOTE — Procedures (Signed)
[place]  Patient:    GAGAN, DILLION Visit Number: [id number] MRN: [id number]          Service Type: CAT Location: [facility] 2853 01 Attending Physician:  Nathen May Dictated by:   Nathen May, M.D., [facility] Admit Date:  10/30/2001   CC:         Electrophysiology Laboratory  [place]  Madaline Savage, M.D.   Procedure Report  PREOPERATIVE DIAGNOSIS:  Syncope.  POSTOPERATIVE DIAGNOSIS:  Syncope.  PROCEDURE:  Explantation of a previously-implanted loop recorder.  DESCRIPTION OF PROCEDURE:  Following the obtaining of informed consent, the patient was brought to the electrophysiology laboratory and placed on the fluoroscopic table in supine position.  After routine prep and drape, lidocaine was infiltrated along the line of the previous incision and carried down to the level of the ILR pocket.  The pocket was opened.  The anchoring sutures were loosened and removed in total.  The pocket was copiously irrigated with antibiotic-containing saline solution.  At the midposition of the pocket, the anterior and posterior walls were affixed with 5-0 Vicryl suture.  Hemostasis was obtained and then the wound was then closed with three layers in a normal fashion.  The wound was washed, dried, and a benzoin and Steri-Strip dressing was then applied.  The needle counts, sponge counts, and instrument counts were correct at the end of the procedure. Dictated by:   Nathen May, M.D., [facility] Attending Physician:  Nathen May DD:  10/30/01 TD:  10/30/01 Job: [id number]

## 2011-04-23 NOTE — Cardiovascular Report (Signed)
[place]  Patient:    [name], [name]                         MRN: [id number] Proc. Date: 07/11/00 Adm. Date:  [id number] Attending:  Berry, Jonathan [place] CC:         Salvatore Decent. Cornelius Moras, M.D.             Cardiac Catheterization Laboratory                        Cardiac Catheterization  PROCEDURES PERFORMED: 1. Combined left heart catheterization.    a. Selective coronary angiography by Judkins technique.    b. Retrograde left heart catheterization.    c. Left ventricular angiography. 2. Selective visualization of the left internal mammary bypass graft. 3. Selective visualization of multiple saphenous vein grafts. 4. Abdominal aortography with bifemoral runoff.  COMPLICATIONS:  None.  ENTRY SITE:  Right femoral.  DYE USED:  Omnipaque.  Approximately dye load 275 cc.  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  He entered the hospital on Friday, August 3 after sustaining a syncopal episode and falling to the ground loosing consciousness and occipital scalp hematoma.  There was some note of a orthostatic blood pressure prior to the event.  Subsequent hospital evaluation has some bigeminy and PVCs, sinus bradycardias, borderline first-degree A-V block, but no evidence of orthostatic hypotension, hypoglycemia or any easily detectable cause for his syncope.  He enters the catheterization lab today after undergoing an echocardiogram showing a good LV function and mild aortic regurgitation.  Todays study is done on an inpatient basis selectively.  RESULTS:  PRESSURES:  The left ventricular pressure was 155/20,  central aortic pressure 155/50.  No aortic valve gradient by pullback technique.  ANGIOGRAPHIC RESULTS:  The left main coronary artery was normal.  The left anterior descending coronary artery courses to  the cardiac apex. There are three septal perforator branches of medium size, one major diagonal branch.  The diagonal branch is occluded proximally.  The LAD itself contains an 80-90% stenosis in the midportion of the vessel.  There is noted to be good flow into the distal LAD through a left internal mammary artery bypass graft which is described later.  The diagonal branch fills distally by way of a patent graft to it.  The left circumflex coronary artery consists of an irregular circumflex.  The forth obtuse marginal branch is proximally occluded.  The distal portion of this obtuse marginal branch fills by way of collaterals from a patent saphenous vein graft.  The right coronary artery was 100% occluded in the midportion of the vessel. Distal flow is achieved by way of retrograde of collaterals from the patent saphenous vein graft.  The saphenous vein graft to the right coronary artery is a widely patent vein graft with luminal irregularities but no significant lesion and distal runoff is excellent.  The saphenous vein graft to the forth obtuse marginal branch is also widely patent throughout its course with good distal runoff.  The saphenous vein graft to the first diagonal branch is widely patent.  There is ectasia proximally and distally with the anastomotic sites, both proximal and distal are normal, and the distal runoff into the diagonal is excellent.  The left internal mammary artery graft to the distal left anterior descending  is patent with good runoff.  The left ventricle shows normal contractility with an ejection fraction estimate of 60-65%.  There is left ventricular hypertrophy within systolic squeezing of the lower one-third of the ventricle.  There is no mitral regurgitation.  No evidence of LV thrombus is noted.  The aortogram shows mild aortic regurgitation.  Abdominal aortography shows normal renal artery contour.  The distal aorta shows luminal  irregularities and some mild calcifications but no aneurysm.  The common iliacs appear normal on the left, approximately 50% stenosed at the origin on the right.  The external iliacs show marked luminal irregularities and tortuosity.  The internal iliacs are both diseased bilaterally.  The left 90-95%, the right 75%.  FINAL DIAGNOSES: 1. Multivessel native coronary artery disease as described above. 2. Status post coronary artery bypass grafting with all three    saphenous vein grafts and one internal mammary artery graft patent    to there respective targets. 3. Normal left ventricular systolic function. 4. Mild aortic regurgitation. 5. Mild peripheral vascular disease. 6. No significant renal artery stenosis. DD:  07/11/00 TD:  07/11/00 Job: [id number]

## 2011-04-23 NOTE — Cardiovascular Report (Signed)
[name], LUCCHESI NO.:  [id number]   MEDICAL RECORD NO.:  [id number]                   PATIENT TYPE:  INP   LOCATION:  3701                                 FACILITY:  [facility]   PHYSICIAN:  Nanetta Batty, M.D.                DATE OF BIRTH:  [birth year]   DATE OF PROCEDURE:  11/16/2002  DATE OF DISCHARGE:                              CARDIAC CATHETERIZATION   INDICATIONS FOR PROCEDURE:  The patient is a 77 year old black male status  post coronary artery bypass grafting, February 2000.  He has had several  admissions for syncope.  He has been catheterized multiple times, the last  time July 11, 2000, revealing patent grafts and normal LV function.  He was  admitted December 10 with syncope was found to be bradycardic.  He presents  now for diagnostic coronary arteriography to rule out an ischemic etiology  and potential permanent transvenous pacemaker insertion on Monday.   DESCRIPTION OF PROCEDURE:  The patient was brought to the second floor [facility] in the postabsorptive state.  He was  premedicated with p.o. Valium.  His right groin was prepped and shaved in  the usual sterile fashion.  Xylocaine, 1%, was used for local anesthesia.  A  #6 French sheath was inserted into the right femoral artery using the  standard Seldinger technique.  The #6 French right and left Judkins  diagnostic catheters along with a #6 French pigtail catheter were used for  selective coronary angiography, left ventriculography, selective vein graft  angiography, and selective IMA angiography.  A right bypass graft catheter  was used to cannulate the ostium of the RCA SVG.  Omnipaque dye was used for  the entirety of the case.  Retrograde aorta, ventricular, and pullback  pressures were recorded.   HEMODYNAMICS:  1. Aortic systolic pressure 149, diastolic pressure 49.  2. Left ventricular systolic pressure 146, end diastolic pressure  28.   SELECTIVE CORONARY ANGIOGRAPHY:  1. Left main:  Normal.  2. LAD:  The LAD is totally occluded in its mid portion.  The first diagonal     branch had high-grade ostial first proximal stenosis and was obviously     bypassed.  3. Left circumflex:  The third or fourth marginal branch is occluded.  The     first marginal branch was moderate in size and had diffuse moderate     lumpy-bumpy disease.  4. Right coronary artery:  Occluded in its mid portion.  5. Vein graft to the distal right coronary/PDA:  Widely patent, very     tortuous.  6. Vein graft to the diagonal branch:  Widely patent.  7. Vein graft to the ________ marginal branch:  Widely patent.  8. IMA to the LAD:  Widely patent.  9. Left ventriculography:  RAO left ventriculogram was performed using 25 cc     of Omnipaque dye  at 12 cc/second.  The overall LVEF was estimated at     greater than 60% without focal wall motion abnormalities.   IMPRESSION:  The patient has widely patent grafts and normal left  ventricular function.  I believe his bradycardia is nonischemically mediated  and most likely is related to chronotropic incompetence.  He has been off  negative chronotropes.   The sheaths were removed and pressure was held on the groin to achieve  hemostasis.  The patient left the lab in stable condition.  He will undergo  permanent transvenous pacemaker insertion by Dr. Lenise Herald on Monday.                                               Nanetta Batty, M.D.    Cordelia Pen  D:  11/16/2002  T:  11/16/2002  Job:  [id number]   cc:   Madaline Savage, M.D.  [address], [address]  [place]  [place] [address]  Fax: [phone]   Cardiac Catheterization Lab

## 2011-04-23 NOTE — H&P (Signed)
NAMEMOHAMMAD, GRANADE NO.:  [id number]   MEDICAL RECORD NO.:  [id number]                   PATIENT TYPE:  EMS   LOCATION:  MAJO                                 FACILITY:  [facility]   PHYSICIAN:  Madaline Savage, M.D.             DATE OF BIRTH:  December 01, 1927   DATE OF ADMISSION:  11/14/2002  DATE OF DISCHARGE:                                HISTORY & PHYSICAL   CHIEF COMPLAINT:  Syncope.   HISTORY OF PRESENT ILLNESS:  The patient is a 77 year old African-American  male with a history of coronary disease.  He had bypass surgery in February  of 2000 with an LIMA to LAD, SVG to diagonal, SVG to OM4, and SVG to the  PDA.  He was admitted in August of 2001 with a syncopal spell.  He was seen  by ED and had a cardiac catheterization, 2-D echocardiogram, tilt table, and  ultimately a loop recorder implanted.  There was no obvious cause for  syncope.  Dr. Graciela Husbands suspected he may have had intermittent atrial  arrhythmias with baseline sinus bradycardia.  The patient has done well  since then.  He does have some dyspnea on exertion but no chest pain.  Today  he was standing talking to his daughter when he suddenly became warm and  dizzy and fell back and hit his head.  He has had no serious injury.  He  denies any palpitations or tachycardia at the time.  In the emergency room  his heart rate was 45.   PAST MEDICAL HISTORY:  1. Remarkable for previous CVA.  2. Treated hypertension.  3. Peripheral vascular disease.  He had an angiogram in March of 2002 and     has been treated medically.  4. He has treated hyperlipidemia.   CURRENT MEDICATIONS:  1. Toprol XL 50 mg a day.  2. Lasix 20 mg a day.  3. Pletal 100 mg b.i.d.  4. Lipitor 20 mg a day.  5. Altace 2.5 mg a day.  6. Norvasc 10 mg a day.  7. Aspirin.   ALLERGIES:  He has no known drug allergies.   SOCIAL HISTORY:  He is married.  He has four children, six grandchildren,  and smokes half a pack a  day.   FAMILY HISTORY:  His father died at 80 of lung cancer.  His mother is alive  and well at 62 and actually she lives alone.   REVIEW OF SYSTEMS:  Remarkable for chronic claudication.  He does wear  glasses.  He had eyelid surgery at [facility] in December 2002.  Review of systems essentially unremarkable otherwise except where noted.   PHYSICAL EXAMINATION:  VITAL SIGNS:  Blood pressure 160/63 lying, standing  138/58, pulse 46, respirations 16.  GENERAL:  He is a well-developed, well-nourished, African-American male in  no acute distress.  HEENT:  Normocephalic.  Extraocular movements are intact.  Sclerae are  nonicteric.  Lids and conjunctivae are within normal limits.  NECK:  Without bruit and without JVD.  CHEST:  Reveals some basilar crackles, otherwise clear.  CARDIAC:  Reveals regular rate and rhythm with a 1-2/6 midsystolic murmur at  the left sternal border and apex with a normal S1, S2.  ABDOMEN:  Reveals a midline abdominal bruit and a slightly widening  abdominal aortic pulsation.  EXTREMITIES:  Reveal bilateral femoral bruits with diminished distal pulses.  NEUROLOGIC:  Grossly intact.  He is awake, alert, oriented, and cooperative.  He moves all extremities without obvious deficit.   LABORATORY DATA:  His EKG shows sinus rhythm, sinus bradycardia.  CT of his  head done in the emergency room showed some mild atrophy, otherwise within  normal limits.   Hematology profile shows white count of 10.1, hemoglobin 13.6, hematocrit  40.6, platelets 179.  CK 75, 1.5 MB, troponin is 0.04.  Renal function is  pending.   Chest x-ray shows no active disease.   IMPRESSION:  1. Syncope, question secondary to bradycardia.  2. Sinus bradycardia in the emergency room.  3. Coronary disease, coronary artery bypass grafting, February 2000, with     patent grafts August 2001.  4. Past history of cerebrovascular accident.  5. Treated hypertension.  6. History of  smoking.  7. Treated hyperlipidemia.  8. Peripheral vascular disease with chronic claudication, he does have an     abdominal bruit, although his angiogram did not show a significant     aneurysm in March of 2002.   PLAN:  The patient will be admitted to telemetry, will stop Toprol, and  admit for 24 hour observation.        Abelino Derrick, P.A.                      Madaline Savage, M.D.    Lenard Lance  D:  11/14/2002  T:  11/14/2002  Job:  [id number]

## 2011-04-23 NOTE — Discharge Summary (Signed)
[name], [name]                            ACCOUNT NO.:  [id number]   MEDICAL RECORD NO.:  [id number]                   PATIENT TYPE:  INP   LOCATION:  3701                                 FACILITY:  [facility]   PHYSICIAN:  Madaline Savage, M.D.             DATE OF BIRTH:  [birth year]   DATE OF ADMISSION:  11/14/2002  DATE OF DISCHARGE:  11/20/2002                                 DISCHARGE SUMMARY   DISCHARGE DIAGNOSES:  1. Syncope secondary to bradycardia.  2. Bradycardia.  3. Coronary artery disease with patent graft per catheterization this     admission.  4. History of coronary artery bypass grafting.  5. Hypertension, stable.   CONDITION ON DISCHARGE:  Improved.   PROCEDURES:  On 11/16/2002 Left heart catheterization with graft  visualization by Dr. Nanetta Batty.  On 11/19/2002 placement of a Medtronic Kappa permanent pacemaker placed by  Dr. Lenise Herald.   DISCHARGE MEDICATIONS:  1. Pletal 100 mg twice a day.  2. Lasix 20 mg daily.  3. Lipitor 20 mg daily.  4. Altace 2.5 mg daily.  5. Norvasc 10 mg daily.  6. Coated aspirin start 11/21/2002.  7. Toprol XL 50 mg daily.   DISCHARGE INSTRUCTIONS:  1. Tylenol two tabs every four hours if needed for pain.  2. Activity as tolerated. Do not raise arm above head.  3. Low salt diet.  4. Do not get site wet for seven days.  5. Follow-up with Dr. Jenne Campus 11/30/2002 at 10 a.m.   HISTORY OF PRESENT ILLNESS:  The patient is a 77 year old African-American  male with a history of coronary disease with bypass grafting February 2000  with LIMA to the LAD, saphenous vein graft to the diagonal, saphenous vein  graft to OM, and saphenous vein graft to the PDA. He was admitted August  2001 with syncope. Seen by  ED. Cardiac catheterization, 2-D echo, tilt  table, and  ultimately loop recorder were implanted. No obvious cause for  syncope.   At that time Dr. Noel Gerold suspected that he may have had intermittent atrial  arrhythmias with baseline sinus bradycardia. The patient had done well until  on this admission. On 11/14/2002 he was talking to his daughter when he  suddenly became warm, dizzy, and fell back and his head. He had no serious  injury. He denied any palpitations or tachycardia. In the emergency room his  heart rate was 45. He also admitted with a similar episode on the Monday  prior to the admission, unwitnessed. He  was getting out of a chair, felt  strange, fell back, was aware of  falling, but could not control body to  stop. Was holding the phone at the time and when the feeling finally passed  he was still holding the phone.   PAST MEDICAL HISTORY:  Cardiac as started. History of previous CVA,  hypertension, peripheral vascular disease, with an angiogram 02/2001  treated  medically. Outpatient managed Toprol XL 50 daily, Lasix 20 daily, Pletal 100  b.i.d., Lipitor 20 daily, Altace 2.5 daily, Norvasc 10 daily, and aspirin.   ALLERGIES:  NO KNOWN DRUG ALLERGIES.   For family history, social history, and review of systems, see H&P.   PHYSICAL EXAMINATION:  VITAL SIGNS: At discharge blood pressure 122/80,  pulse 68, respirations 20, temperature 98, room air oxygen saturation 96%.  GENERAL:  Alert and oriented African-American male.  HEART:  S1, S2.  Regular rate and rhythm.  LUNGS:  Clear.  Chest wall incision site was intact without drainage,  nontender, and not swollen.   LABORATORY DATA:  Admitting labs:  Hemoglobin 13.6, hematocrit 40.6, WBC 10,  platelets 179, neutrophils 65, lymphs 30, monos 5, eos 1, baso 0.  Hemoglobin at discharge 12.6, hematocrit 37, WBC 9.1, platelets 137.   ProTime on admission 13.3, INR of 1, PTT 34, this remains stable.   Chemistry:  Sodium 142, potassium 3.5 with supplementation was 3.6 at  discharge. Chloride 107, CO2 28, glucose 105, BUN 14, creatinine 1. Calcium  8.4, total protein 5.9, albumin 2.9. AST 15, ALT 8, ALP 74, total bili 0.4.   Cardiac  enzymes:  CK 75, MB 1.5,73, 58. MBs are all negative and troponin I  peaked at 0.05, otherwise negative MI.   TSH 0.980.   Urinalysis:  Small amount of bilirubin, trace leukocyte esterase.   Radiology:  Head CT 11/14/2002 no evidence of acute intracranial abnormality  or skull fracture. Mild cerebral atrophy and old right caudate looking.   Chest x-ray on admission cardiomegaly without heart failure. No evidence of  acute disease.   Follow-up on 12/14 no evidence of acute cardiopulmonary process. Mild  cardiomegaly. PA and lateral on 11/20/2002 after satisfactory placement of  pacemaker intervally, no pneumothorax.   Admission EKG is not currently in the chart. On the 15th he was AV pacing,  and on the 16th AV pacing.   A 2-D echo on 11/15/2002 overall LV systolic function was normal. EF was 55-  65. No evidence of left ventricular regional wall motion abnormalities. AV  is sclerotic with good leaflet excursion. Aortic valve thickness was mildly  to moderately increased. Aortic valve was mildly to moderately calcified.  Mild aortic valve regurgitation, the left atrium was mildly dilated. No  echocardiographic evidence of mitral valve prolapse. There was mild mitral  valvular regurgitation.   Cardiac catheterization.  On 11/16/2002 by Dr. Allyson Sabal all grafts were patent.  Normal LV function.   HOSPITAL COURSE:  The patient was admitted on 11/14/2002 after a syncopal  episode by Dr. Elsie Lincoln. He was admitted to the telemetry unit. The patient  was monitored. He was having bradycardia. A 2-D echo was done. Beta blocker  was held. Neurologic consult was obtained to ensure there was no underlying  neurologic reason for his syncope. I called on mental status, which was  stable. 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   The patient did well. Cardiac catheterization without problems. by 11/19/2002 underwent permanent pacemaker insertion. By the next day, 12/16,  he was  stable with no further complaints. Chest x-ray showed no  pneumothorax. He was stable for discharge home. He will follow up with Dr.  Jenne Campus and then Dr. Elsie Lincoln.     [name]. [name], N.P.                     Madaline Savage, M.D.    LRI/MEDQ  D:  01/31/2003  T:  01/31/2003  Job:  [id number]   cc:   Madaline Savage, M.D.  [address], [address]  [place]  [place] [address]  Fax: [phone]   Marolyn Hammock. Thad Ranger, M.D.  [address]  [place]  [place] [address]  Fax: [phone]   Darlin Priestly, M.D.  [phone] [address]  [place]  [place] [address]  Fax: [phone]   Marjory Lies, M.D.  [address]  [place]  [place] [address]  Fax: [phone]

## 2011-04-23 NOTE — Cardiovascular Report (Signed)
[facility]  Patient:    [name], [name]                         MRN: [id number] Proc. Date: 02/21/01 Adm. Date:  [id number] Attending:  Ruta Hinds CC:         CP Lab  Runell Gess, M.D.  Madaline Savage, M.D.  Delorse Lek, M.D.   Cardiac Catheterization  PROCEDURE:  Retrograde abdominal aortic catheterization, abdominal aortic angiogram midstream PA projection, bilateral lower extremity runoff using digital cut film imaging, single-leg bolus-chase technique.  BRIEF HISTORY:  Mr. Kirkwood is a very pleasant 77 year old African-American male who is married with four children and six grandchildren.  He still smokes a pack and a half a day and has been smoking for over 50 years.  He has bilateral claudication, which is limiting and chronic.  His RABI is 0.57, LABI 0.59, with monophasic basic lower extremity wave forms in the femoral and below bilaterally.  He is status post CABG February 2000 by Dr. Cornelius Moras and has had patent grafts July 07, 2000, with LIMA to distal LAD, SVG to first diagonal, SVG to [facility], and SVG to PDA, when studied August 2001 for syncope.  Etiology of his syncope has not been determined, and he has a loop recorder in place by Dr. Graciela Husbands and had a negative tilt table test.  He has not had any significant arrhythmias or any repeat syncope.  He has had normal LV function.  He also has hypertension and hyperlipidemia.  The patient was seen by me and set up for diagnostic lower extremity angiography for symptom-limiting claudication.  DESCRIPTION OF PROCEDURE:  Patient brought to the sixth floor peripheral lab in a postabsorptive state after 5 mg Valium p.o. premedication.  The right groin was prepped, draped in the usual manner.  Xylocaine 1% was used for loan, and the RFA was entered with single anterior puncture using 18 thin-walled needle.  Using guidewire exchange with 0.035 Wholey wire throughout the  procedure, a 5 [place] Cordis sheath was inserted without difficulty.  A 5 French pigtail catheter was placed above the level of the renal arteries, and abdominal aortic angiogram was done in the midstream PA projection at 30 cc, 20 cc per second.  The left iliac was then accessed with a right inferior mesenteric rounded 5 French catheter using guidewire exchange and a Terumo wire to traverse the left iliac system, and an end-hole catheter. Selective left iliac angiography was done in the PA and oblique projection through the RIMA catheter, and then left lower extremity runoff was done through the end-hole 5 French catheter at 50 cc, 8 cc per second, with visualization of the left foot using VSA cut film imaging and a step table. The catheter was pulled back, and right iliac angiography was done through a sidearm sheath in the oblique projection.  Right lower extremity runoff was done through the sidearm sheath at 55 cc, 8 cc per second, with visualization of the right foot by the same technique.  Catheter was removed, sidearm sheath was flushed.  The patient was brought to the holding area for sheath removal and pressure hemostasis.  He tolerated the procedure well.  Arterial pressures were monitored throughout the procedure and ranged 140-150 mmHg.  The patient was in sinus rhythm.  Creatinine at the beginning of the procedure was 1.5.  Abdominal aortic angiogram in the PA projection showed a patent celiac and SMA  axis.  There was a normal single left renal artery and normal double right renal artery with an accessory inferior pole right renal artery.  There was no renal artery stenosis, and there was good flow.  Immediate infrarenal abdominal aorta had mild irregularities.  Several centimeters above the bifurcation, there was mild aneurysmal dilatation of the distal abdominal aorta extending into the proximal iliacs.  The IMA was intact.  The common iliacs bilaterally had lumpy, bumpy  irregularities but good residual lumen and no stenosis or aneurysm formation.  The hypogastrics bilaterally had proximal 95% stenosis.  There was 70-90% distal stenosis on the left and 70-80% distal stenosis on the right with good flow.  The external iliac on the left was tortuous and lumpy-bumpy, without significant stenosis.  The left profunda was intact and had a 70% lesion in posterior bifurcation branch and a 90% lesion in the other bifurcation branch.  The SFA was occluded at its origin segmentally down to the distal third at [facility] canal, when it was reconstituted by profunda collaterals with some direct collaterals, suggesting chronic total occlusion.  The distal SFA and popliteal had irregularities but was intact.  The infrapopliteal vessels showed LA2 occlusion with two-vessel runoff via the peroneal and a large dominant posterior tibial artery to the left foot with no significant stenosis.  The RAIA had 10-40% lesions with lumpy, bumpy appearance but no high-grade stenosis.  The right profunda had a 70% distal lesion.  The right SFA was totally occluded in its proximal portion segmentally, once again symmetrically down to [facility] canal.  There were then direct collaterals and reconstitution via collaterals from the profunda to the distal RSFA.  The popliteal had irregularities but was intact with no significant stenosis.  The right RA2 was totally occluded, and there was two-vessel runoff to the right foot via the _____ and the peroneal.  DISCUSSION:  The patient has very symmetrical peripheral vascular disease with bilateral internal iliac stenosis, bilateral segmental long SFA occlusion, with reconstitution by profunda collaterals bilaterally and two-vessel runoff bilaterally.  He has well-established collaterals suggesting chronic total  occlusions in these areas.  Depending on this patients level of symptoms, he is a candidate for continued medical therapy, and  he has recently been started on Pletal.  He is a potential candidate for surgical intervention with bilateral femoral-popliteal bypasses to be considered if symptoms warrant this.  He is, fortunately, not having any rest pain or lower extremity ulcerations and has multiple other medical problems.  The patient will be seen back in follow-up and the case reviewed and discussed with his primary cardiologist, Dr. Elsie Lincoln, and his primary physician, Dr. Marjory Lies.  CATHETERIZATION DIAGNOSES:  1. Peripheral vascular disease.     a. Bilateral internal iliac stenosis as outlined.     b. Bilateral segmental long superficial femoral artery occlusion.     c. Reconstitution distal superficial femoral artery via profunda        collaterals, well-established.     d. Bilateral two-vessel runoff with bilateral anterior tibial occlusion.  2. Mild aneurysmal dilatation of the infrarenal abdominal aorta with patent     mesenteric vessels.  3. Normal renal arteries bilaterally.  4. Systemic hypertension.  6. Coronary artery disease, status post multivessel coronary artery bypass     graft February 2000, Dr. Cornelius Moras, with patent grafts July 07, 2000.  7. Past hemorrhagic stroke, basilar ganglia hemorrhage, secondary to     hypertension, remote.  Mild slurred speech, no other significant residual.  8.  Symptom-limiting claudication with anatomy as outlined above.  9. Hyperlipidemia. 10. Syncope August 2001, etiology not determined, loop recorder in place. DD:  02/21/01 TD:  02/22/01 Job: [id number]

## 2011-04-23 NOTE — Op Note (Signed)
. [facility]  Patient:    [name], [name]                         MRN: [id number] Proc. Date: 07/15/00 Adm. Date:  [id number] Disc. Date: [id number] Attending:  Nathen May                           Operative Report  PREOPERATIVE DIAGNOSIS:  Syncope.  POSTOPERATIVE DIAGNOSIS:  Syncope.  OPERATION:  Implantation of an insertable loupe recorder.  DESCRIPTION OF PROCEDURE:  Following the attainment of informed consent, the patient was then prepped for insertion of an implantable loupe recorder.  After routine prep and drape of the left upper chest, Lidocaine was infiltrated along the left parasternal area at the 4th intracostal interspace. The incision was made and carried down to the layer of the prepectoral fascia and a pocket was formed using electrocautery.  Hemostasis was obtained.  A [employer] implantable loupe recorder Serial [id number] was inserted and affixed to the prepectoral fascia.  The wound was closed in 3 layers in a normal fashion. The wound was washed and dried, and a Benzoin and a Steri-Strip dressing was then applied.  Needle count, sponge counts and instrument counts were correct at the end of the procedure according to the staff. DD:  07/15/00 TD:  07/16/00 Job: [id number]

## 2011-04-23 NOTE — Consult Note (Signed)
[place]. [facility]  Patient:    [name], [name]                         MRN: [id number] Proc. Date: 07/11/00 Adm. Date:  [id number] Attending:  Berry, Jonathan [place]azil[place] CC:         Madaline Savage, M.D.             Electrophysiology Laboratory                          Consultation Report  HISTORY OF PRESENT ILLNESS:  Thank you very much for asking me to see [name] in electrophysiological consultation for syncope in the setting of ischemic heart disease.  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  He also has a history of bilateral claudication and has, as noted, documented vascular disease.  The patient denies shortness of breath or chest pain and has had no significant functional intolerance with modest dyspnea on exertion.  The patient has noted over the last six months episodes of lightheadedness associated with going from a bent-over position to standing, although these episodes have been rather infrequent.  He does also have a history of hypertension.  The other day, he had been running around town doing errands, trying to get his bus ready for work.  He in the moments prior had been filling his air conditioner system with freon for his bus.  He thereafter moved to the front of the bus, where at about waist level there was a panel overlying the condenser.  He opened the panel and while still bent over, closed the panel. It was upon arising and looking down the length of the bus at his colleague that he had a sensation that he recognized as having had before with previous changes in position and then lost consciousness.  The duration of his loss of consciousness is not clear, but there was enough time  for his partner to have seen him do what he did with the patient, go to the office, call EMS, and then return prior to the patient being aware of his surroundings.  On arrival by EMS, the patients rhythm was noted to be somewhat bradycardic at 60.  His blood pressure sitting was 130/78, and the patient was then stood up and his blood pressure went to 98 and "almost passed out, completely out." There was no significant change in his heart rate at that time.  He has subsequently ruled out for myocardial infarction, has been on telemetry, which is recorded below, and underwent catheterization demonstrating patent grafts with intrinsic disease to all three vessels.  His left ventricular function was hyperdynamic, and he had aortoiliac disease.  He also has a history of mild aortic insufficiency.  The patient denies prior syncope.  He has had some positional lightheadedness as noted previously.  He has no history of palpitations.  PAST MEDICAL HISTORY:  Notable for hypertension.  He has also had a hemorrhagic CVA with some mild speech residua.  He has hyperlipidemia.  REVIEW OF SYSTEMS:  Otherwise noncontributory.  SOCIAL HISTORY:  He is married.  He has four children and six grandchildren. He smokes a half pack of cigarettes per day.  He is a Actuary.  PHYSICAL EXAMINATION:  VITAL SIGNS:  His blood pressure today is 142/64.  Yesterday orthostatics were negative.  His heart rate is 64 and irregular.  HEENT:  No scleral icterus, no xanthomata.  NECK:  His neck veins were at 7-8 cm.  His carotids were brisk bilaterally without bruits.  BACK:  Without kyphosis or scoliosis.  LUNGS:  Clear.  CARDIAC:  His PMI was nondisplaced.  S1 and S2 were normal.  There was a 2/6 early systolic murmur, and no aortic insufficiency was appreciated.  ABDOMEN:  Soft with active bowel sounds.  There was no hepatomegaly or midline pulsation.  PULSES:  Femoral pulses were 2+,  distal pulses were trace.  EXTREMITIES:  Without peripheral edema.  DIAGNOSTIC EVALUATION:  Electrocardiogram demonstrates sinus rhythm at 55 with intervals of 0.20/0.09/0.42, with an axis that is leftward at -50 degrees. Telemetry demonstrates sinus bradycardia to 48 without significant pauses.  He also has frequent atrial ectopy at _____ intervals of 560 milliseconds and some suggestion of an atrial tachycardia with a rate of up to 400 milliseconds.  IMPRESSION: 1. Syncope with minimal warning and relatively repaid recovery. 2. Sinus bradycardia. 3. Atrial tachycardia, nonsustained, without known palpitations. 4. Documented orthostatic hypotension and history of same. 5. Hypertension. 6. Ischemic heart disease with:    a. Ejection fraction of 65%, hyperdynamic left ventricular function.    b. Status post coronary artery bypass graft.    c. Mild aortic insufficiency.    d. Class 2. 7. Peripheral vascular disease, aortoiliac. 8. History of hemorrhagic CVA.  DISCUSSION:  Mr. Spreen had syncope with very little warning that followed a change of position and potentially a rotation of his head.  His rhythm strips have been unrevealing, though he does have sinus bradycardia and a nonsustained atrial tachycardia.  This suggests the possibility of a tachy-brady syndrome.  However, his history is more suggestive of orthostatic hypotension.  There is also documented orthostasis by the EMTs on arrival at the scene.  However, data demonstrates that 30% of patients with other definitive causes of syncope other than orthostatic hypotension will have orthostasis documented on initial evaluation.  Thus, we must be very careful about attributing syncope to orthostasis when there is a history of a prevalent problem in patients with syncope.  The abruptness of his syncope strongly suggests a bradyarrhythmia, though syncope without warning can also happen in  vasodepressor/orthostatic phenomena.  Given his normal left ventricular systolic function, it is unlikely that he  has a ventricular arrhythmia.  RECOMMENDATIONS:  Based on the above, we will therefore: 1. Undertake tilt-table testing with carotid sinus massage following review    of his carotid Dopplers. 2. Continue telemetry for 24 more hours. 3. Consideration for an implantable loop recorder if the aforementioned    evaluation is nondiagnostic, especially given his high-risk profession. 4. No driving for the interim.  Thank you for this consultation. DD:  07/11/00 TD:  07/12/00 Job: [id number]

## 2011-04-23 NOTE — Op Note (Signed)
[name], [name]                            ACCOUNT NO.:  [id number]   MEDICAL RECORD NO.:  [id number]                   PATIENT TYPE:  INP   LOCATION:  3701                                 FACILITY:  [facility]   PHYSICIAN:  Darlin Priestly, M.D.             DATE OF BIRTH:  May 08, 1927   DATE OF PROCEDURE:  11/19/2002  DATE OF DISCHARGE:                                 OPERATIVE REPORT   PROCEDURE:  Insertion of a dual-chamber pacemaker using Medtronic Kappa KDR  901 with passive ventricular and active atrial leads.   COMPLICATIONS:  None.   INDICATIONS:  The patient is a 77 year old male, patient of Dr. Lavonne Chick  with a history of PSVT, coronary bypass surgery.  The patient has had a  history of syncope, status post a loop recorder insertion approximately two  years ago, which revealed no significant ectopy. The patient was readmitted  on November 14, 2002, with another syncopal event.  The patient was noted at  the time to have marked bradycardia, heart rates in the 40s.  His beta  blocker was subsequently discontinued.  However, he continued to have  persistent bradycardia and with his history of PSVT as well as CAD, admitted  for beta blocker.  The patient is now referred for pacemaker placement.   DESCRIPTION OF PROCEDURE:  After given informed written consent, the patient  was brought to the cardiac catheterization laboratory where the left chest  was shaved, prepped and draped in a sterile fashion.  Lidocaine 1% was then  used to anesthetize the left subclavian area.  Next, approximately 3 cm  horizontal incision was performed beneath the left clavicle and this was  bluntly dissected down to the pectoral fascia.  Hemostasis obtained.  Next,  approximately 3 x 4 cm pocket was then created over the left pectoral  fascia.  Again hemostasis was obtained with electrocautery.  Next, a single  left subclavian stick was then used to enter the left subclavian vein, and a  guidewire was then easily passed into the SVC and right atrium.  Next, a #9  [place] and sheath was then easily passed over the guidewire.  The guidewire  and dilator was removed and a Medtronic 58 cm, model 5092, serial  #[id number] ventricular lead was then passed into the right atrium.  Retaining guidewire was then reinserted through the sheath and peel-away  sheath then removed. A second 9 French dilator sheath was then passed over  the guidewire and the guidewire and dilator was then removed.  A second  Medtronic 52 cm, model #5076 active fixation lead, serial #[id number] was  then easily passed in the right atrium.  The retaining guidewire was then  reinserted and the peel-away sheath was then removed.  The guidewire was  then anchored to the drape using mosquito clamp.  A J curve was then placed  into the ventricular stylet and the ventricular  lead was then easily  advanced into the RV outflow tract.  The curved stylet was then removed and  a straight stylet was then inserted into the ventricular lead and the  ventricular lead was then pulled back and allowed to prolapse into the RV  apex.  Thresholds were then determined.  R waves were 19.2 mV.  Threshold in  the ventricle was 7.3 volts at 0.5 msec, current was 0.7 mA.  Impedence was  710 ohms.  We then placed a J curve stylet into the atrial lead and the  atrial lead was then positioned.  The lead appeared to fall into the right  atrial appendage and this lead was then screwed into position.  Thresholds  then determined.  P waves were sensed at 3.1 mV.  Threshold in the atrium  was 0.9 volts at 0.5 msec, current is 1.8 mA. Impedence is 675 ohms.  These  leads were then sutured in place using two silk sutures per lead.  The  pocket was then copiously irrigated with 1% kanamycin solution.  The leads  were then connected in a serial fashion to a Medtronic Kappa KDR 901, serial  [id number] generator.  This was then delivered into the  pocket without  complications.  The pocket was then closed using running 2-0 Dexon.  The  skin layer was then closed using running 5-0 Dexon.  Steri-Strips were  applied.  The patient was transferred to the recovery room in stable  condition.    CONCLUSIONS:  Successful placement of a Medtronic Kappa, KDR 901, serial  [id number] with passive ventricular and active atrial leads.                                                  Darlin Priestly, M.D.    RHM/MEDQ  D:  11/19/2002  T:  11/19/2002  Job:  [id number]   cc:   Madaline Savage, M.D.  [address], [address]  [place]  [place] [address]  Fax: [phone]

## 2011-04-23 NOTE — Discharge Summary (Signed)
NAMEKAY, RICCIUTI                ACCOUNT NO.:  [id number]   MEDICAL RECORD NO.:  [id number]          PATIENT TYPE:  INP   LOCATION:  6525                         FACILITY:  [facility]   PHYSICIAN:  Madaline Savage, M.D.DATE OF BIRTH:  [birth year]   DATE OF ADMISSION:  09/26/2004  DATE OF DISCHARGE:  09/30/2004                                 DISCHARGE SUMMARY   DISCHARGE DIAGNOSES:  1.  Coronary artery disease, status post catheterization on September 28, 2004.  No intervention.  2.  History of coronary artery bypass grafting in 2003.  3.  Status post syncope secondary bradycardia and paroxysmal      supraventricular tachycardia.  4.  Status post permanent transvenous pacemaker for sick sinus syndrome.  5.  Hypertension.  6.  Hyperlipidemia.  7.  Peripheral vascular disease with lower extremity decreased ankle      brachial indices, bilateral superficial femoral artery occlusion and      bilateral iliac stents.  8.  History of cerebrovascular accident.   DISCHARGE MEDICATIONS:  1.  Aspirin 81 mg daily.  2.  Lisinopril 10 mg daily.  3.  Flomax 0.4 mg daily.  4.  HCTZ 12.5 mg daily.  5.  Celexa 20 mg daily.  6.  Lopressor 100 mg b.i.d.  7.  Lipitor 5 mg daily.  8.  Nitroglycerin sublingual 0.4 mg p.r.n. chest pain.   DISCHARGE ACTIVITY:  No driving.  No lifting greater than 5 pounds.  No  strenuous activity for three days post catheterization.   DISCHARGE DIET:  Low-fat, low-cholesterol diet.   DISCHARGE FOLLOWUP:  Dr. Elsie Lincoln will see the patient on October 22, 2004,  at 12 p.m.   HISTORY OF PRESENT ILLNESS:  Mr. Manigo is a 77 year old African American  gentleman with a prior history of coronary artery disease and status post  CABG, a history of prior syncope secondary to bradycardia, orthostatic,  hypertension and permanent pacemaker.  He developed chest pain with  shortness of breath that was waxing and waning and also some nausea, but no  diaphoresis.  He presented  to the emergency room with the above complaints  and was admitted on rule out MI protocol.  Dr. Clarene Duke saw the patient on  admission and scheduled him for cardiac catheterization the following day.   HOSPITAL COURSE:  Cardiac catheterization was performed by Dr. Allyson Sabal on  September 28, 2004.  It showed the patient's grafts, but there was a 60-70%  hyperdense lesion just after the aorta ostial location of the posterior  descending artery graft.  At that time, Dr. Allyson Sabal felt like medical therapy  would be the best option for the patient, although it was possible that the  near ostial posterior descending artery saphenous vein graft lesion might be  hemodynamically significant.   The patient tolerated the procedure well.  No intervention was done during  this catheterization.  He was transferred to the unit in stable condition.   His laboratories in the hospital revealed sodium 135, potassium 4.3, glucose  94, BUN 6 and creatinine 1.2.  The hemoglobin was  12.4, hematocrit 36.5,  platelets 157 and white blood cell count 8.3.  The TSH was 0.939, normal.  Cardiac enzymes were negative.  Because of his ongoing complaint of nausea,  lipase and amylase were checked and both revealed normal values.  The  magnesium was 2.3.  Liver function tests were normal also.  His lipid  profile showed cholesterol 150, triglycerides 49, HDL 35 and LDL 105.  The  patient was advised to increase his Lipitor to 10 mg daily.   The patient was discharged from the hospital in stable condition.  He is  going to be seen by Dr. Elsie Lincoln on October 22, 2004, at 12 p.m.       MK/MEDQ  D:  09/30/2004  T:  09/30/2004  Job:  [id number]   cc:   [facility]

## 2011-04-23 NOTE — Op Note (Signed)
[address]. [facility]  Patient:    RENAN, DANESE                         MRN: [id number] Proc. Date: 07/15/00 Adm. Date:  [id number] Disc. Date: [id number] Attending:  Nathen May CC:         Electrophysiology laboratory  Madaline Savage, M.D.  Delsa Grana, R.N., [place].   Operative Report  PREOPERATIVE DIAGNOSIS:  Syncope.  POSTOPERATIVE DIAGNOSIS:  Syncope.  PROCEDURE:  Head up tilt-table testing and carotid sinus massage.  OPERATION IN DETAIL:  Following equilibration in the supine position the patient was brought to the electrophysiology laboratory and placed on the tilting table.  His supine equilibrated blood pressure was approximately 160/70 with a pulse of 50.  He was tilted upright and immediately his blood pressure fell into the 135-140 range without concomitant change in heart rate.   Over the success of 20 minutes to 30 minutes the mean blood pressure drifted into the 120 range without any significant change in heart rate, except 1 burst of paroxysmal activity the mean heart rate remained in the low 60 high 50 range.  Carotid sinus massage was then undertaken.  The right massage accomplished nothing.  The left-sided massage was associated with a pause of 2.7 seconds that was interrupted by a PVC associated with complete heart block and sinus slowing.  This did not reproduce the patients symptoms.  This was repeated on 2 occasions with similar pauses in the ______ RO interval.  IMPRESSION: 1. Orthostatic hypotension. 2. Carotid sinus massage associated with slowing but not diagnostic ______    hypersensitivity and not recapitulating the patients symptoms.  RECOMMENDATIONS:  Implantable loupe recorder. DD:  07/15/00 TD:  07/16/00 Job: [id number]

## 2011-04-23 NOTE — Discharge Summary (Signed)
[place]. [facility]  Patient:    [name], [name]                         MRN: [id number] Adm. Date:  [id number] Disc. Date: [id number] Attending:  Berry, Jonathan [place] Dictator:   Marya Fossa, P.A. CC:         Nathen May, M.D., [facility]  Delorse Lek, M.D., [place]  Madaline Savage, M.D.   Discharge Summary  DATE OF BIRTH:  04-24-1927  PHYSICIANS:  Admitting: Madaline Savage, M.D.  Discharge: Richard A. [name], M.D.  ADMISSION DIAGNOSES: 1. Syncope of unknown etiology. 2. Scalp laceration due to #1. 3. Rule out myocardial infarction. 4. Hypotension post syncope, resolved. 5. Known coronary artery disease status post coronary artery bypass grafting    in February 2000. 6. Tobacco abuse. 7. Peripheral vascular disease with claudication and documented aortoiliac    disease by lower extremity Dopplers.  DISCHARGE DIAGNOSES: 1. Syncope, etiology unknown.  Normal 2-D echocardiogram, negative    orthostatics, possible bradycardic arrhythmia versus chronic sinus    hypersensitivity versus atrial arrhythmia, for outpatient evaluation. 2. No driving for six months. 3. Hypokalemia, resolved.  HISTORY OF PRESENT ILLNESS:  [name] is a pleasant 77 year old, married black male, retired Midwife, who had a syncopal episode around 8 p.m. last night. His blood pressure dropped just before, but he does not know why.  Apparently he was talking to somebody and suddenly without warning had blurred vision and fell to the ground.  He sustained a scalp laceration which was sutured at [facility].  His first enzymes were negative.  Apparently when the patient was found by EMS, he was alert and oriented and had no loss of stool or urine.  There was no episode of postictal period.  Blood pressure bottomed at 108/56 from 137/49.  The patient was transferred to [facility] where blood pressure was stable.  The patient is  admitted for syncope and hypotension with the possibility of arrhythmia as underlying etiology.  He does coronary artery disease and will require cardiac catheterization and will check cardiac enzymes to completely rule out MI.  Will also check a 2-D echocardiogram to rule out thrombus.  PROCEDURES: 1. Cardiac catheterization by Dr. Chanda Busing on July 11, 2000. 2. A 2-D echocardiogram.  CONSULTATIONS:  Dr. Graciela Husbands for EP evaluation.  COMPLICATIONS:  None.  COURSE IN HOSPITAL:  [name] was admitted with the diagnosis of syncope. Cardiac enzymes were negative x 3, and IV heparin was not required.  He was given IV fluid hydration.  A 2-D echocardiogram was performed July 08, 2000, which revealed mild to moderate AI, normal mitral valve with mild MR, trace TR, normal LV size and thickness.  No intracardiac source of embolus.  Orthostatic blood pressures were negative.  Laboratory studies were within normal limits.  The patient was taken to the cardiac catheterization lab on July 11, 2000, by Dr. Elsie Lincoln.  This revealed patent LIMA to the LAD, patent SVG to the diagonal, patent SVG to the OM-4, and patent SVG to the RCA.  Native high-grade CAD.  EF 60 to 65%.  Renals were normal.  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  Dr. Graciela Husbands was consulted for EP evaluation of syncope on July 11, 2000.  After examination, he felt that the patient may have arrhythmia related to atrial tachycardia and/or bradycardic arrhythmia.  He felt the abruptness of onset of  symptoms suggested a bradycardic arrhythmia.  Carotid sinus hypersensitivity is more common in males, and he could have a component tachycardia-bradycardia syndrome with atrial tachycardia of the 120s.  He recommended tilt table testing with carotid massage after 24 hours telemetry.  He may require implantation of loop and no driving x 75 months.  Post cardiac catheterization, the patient remained stable.  He had  some numbness of the right thigh which resolved after the lidocaine wore off. Distal pulses were detectable with Doppler, and the feet remained warm.  The right groin remained stable.  On July 12, 2000, the patient continued to be stable.  He did have a drop in his potassium to 3.4, which was repleted with improvement to 3.9 on August 7.  Due to scheduling, the patient will be discharged home today with driving restrictions.  He will have a tilt table test on Friday by Dr. Graciela Husbands with carotid sinus massage.  He also has peripheral vascular disease and abnormal ABIs with a right of 0.57 and a left of 0.59 on June 22, 2000.  He will see Dr. [name] for a peripheral vascular workup.  He does have limiting claudication.  The patient also is a smoker, and we recommend that he quit smoking.  DISCHARGE MEDICATIONS: 1. Norvasc 10 mg a day. 2. Aspirin 325 mg a day. 3. Zocor 20 mg a day.  ACTIVITY:  No strenuous activity, no lifting more than 5 pounds for two days. No driving for six months.  DIET:  He should follow a low-fat, low-cholesterol, low-salt diet.  WOUND CARE:  He may shower but should not soak in the tub or swim for a week.  FOLLOWUP:  We recommend a followup BMP in one week to check for hypokalemia.  A followup appointment is scheduled with Lezlie Octave on August 27 at 10:45 which is in the office for peripheral evaluation.  A routine office visit with Dr. [name] is scheduled for October 22 at 4:15.  At some point, he will need to be seen back by Dr. Elsie Lincoln for routine followup.  The patient will have a tilt table study on Friday, July 15, 2000, and Dr. Koren Bound office will make arrangements with the patient.  The patients [facility] medical record number is [id number]. DD:  07/12/00 TD:  07/13/00 Job: [id number]

## 2011-05-02 ENCOUNTER — Inpatient Hospital Stay (HOSPITAL_COMMUNITY)
Admission: EM | Admit: 2011-05-02 | Discharge: 2011-05-06 | DRG: 281 | Disposition: A | Discharge status: Home / Self Care | Payer: Medicare Other | Attending: Internal Medicine | Admitting: Internal Medicine

## 2011-05-02 ENCOUNTER — Emergency Department (HOSPITAL_COMMUNITY): Payer: Medicare Other

## 2011-05-02 DIAGNOSIS — I2582 Chronic total occlusion of coronary artery: Secondary | ICD-10-CM | POA: Diagnosis present

## 2011-05-02 DIAGNOSIS — I214 Non-ST elevation (NSTEMI) myocardial infarction: Principal | ICD-10-CM | POA: Diagnosis present

## 2011-05-02 DIAGNOSIS — Z950 Presence of cardiac pacemaker: Secondary | ICD-10-CM

## 2011-05-02 DIAGNOSIS — F17200 Nicotine dependence, unspecified, uncomplicated: Secondary | ICD-10-CM | POA: Diagnosis present

## 2011-05-02 DIAGNOSIS — M1990 Unspecified osteoarthritis, unspecified site: Secondary | ICD-10-CM | POA: Diagnosis present

## 2011-05-02 DIAGNOSIS — Z79899 Other long term (current) drug therapy: Secondary | ICD-10-CM

## 2011-05-02 DIAGNOSIS — I739 Peripheral vascular disease, unspecified: Secondary | ICD-10-CM | POA: Diagnosis present

## 2011-05-02 DIAGNOSIS — I10 Essential (primary) hypertension: Secondary | ICD-10-CM | POA: Diagnosis present

## 2011-05-02 DIAGNOSIS — E785 Hyperlipidemia, unspecified: Secondary | ICD-10-CM | POA: Diagnosis present

## 2011-05-02 DIAGNOSIS — I25810 Atherosclerosis of coronary artery bypass graft(s) without angina pectoris: Secondary | ICD-10-CM | POA: Diagnosis present

## 2011-05-02 DIAGNOSIS — I2510 Atherosclerotic heart disease of native coronary artery without angina pectoris: Secondary | ICD-10-CM | POA: Diagnosis present

## 2011-05-02 DIAGNOSIS — I252 Old myocardial infarction: Secondary | ICD-10-CM

## 2011-05-02 LAB — BASIC METABOLIC PANEL
CO2: 28 mEq/L (ref 19–32)
Chloride: 100 mEq/L (ref 96–112)
Creatinine, Ser: 1.28 mg/dL (ref 0.4–1.5)
GFR calc Af Amer: >60 mL/min (ref >60)

## 2011-05-02 LAB — CBC
HCT: 40.5 % (ref 39.0–52.0)
MCHC: 34.8 g/dL (ref 30.0–36.0)
RDW: 14.4 % (ref 11.5–15.5)

## 2011-05-02 LAB — PRO B NATRIURETIC PEPTIDE: Pro B Natriuretic peptide (BNP): 1132 pg/mL — ABNORMAL HIGH (ref 0–450)

## 2011-05-02 LAB — DIFFERENTIAL
Basophils Absolute: 0 10*3/uL (ref 0.0–0.1)
Basophils Relative: 0 % (ref 0–1)
Eosinophils Relative: 0 % (ref 0–5)
Monocytes Absolute: 0.4 10*3/uL (ref 0.1–1.0)

## 2011-05-02 LAB — CK TOTAL AND CKMB (NOT AT ARMC): Relative Index: INVALID (ref 0.0–2.5)

## 2011-05-03 LAB — COMPREHENSIVE METABOLIC PANEL
Alkaline Phosphatase: 70 U/L (ref 39–117)
BUN: 18 mg/dL (ref 6–23)
Calcium: 8.4 mg/dL (ref 8.4–10.5)
GFR calc non Af Amer: >60 mL/min (ref >60)
Glucose, Bld: 85 mg/dL (ref 70–99)
Potassium: 3.3 mEq/L — ABNORMAL LOW (ref 3.5–5.1)
Total Protein: 5.7 g/dL — ABNORMAL LOW (ref 6.0–8.3)

## 2011-05-03 LAB — LIPID PANEL
Cholesterol: 160 mg/dL (ref 0–200)
HDL: 45 mg/dL (ref >39)
LDL Cholesterol: 105 mg/dL — ABNORMAL HIGH (ref 0–99)
Total CHOL/HDL Ratio: 3.6 RATIO

## 2011-05-03 LAB — APTT: aPTT: 33 seconds (ref 24–37)

## 2011-05-03 LAB — CBC
HCT: 38.3 % — ABNORMAL LOW (ref 39.0–52.0)
MCHC: 34.5 g/dL (ref 30.0–36.0)
MCV: 91 fL (ref 78.0–100.0)
RDW: 14.6 % (ref 11.5–15.5)

## 2011-05-03 LAB — CK TOTAL AND CKMB (NOT AT ARMC)
CK, MB: 9.1 ng/mL (ref 0.3–4.0)
Relative Index: 8.9 — ABNORMAL HIGH (ref 0.0–2.5)

## 2011-05-03 LAB — CARDIAC PANEL(CRET KIN+CKTOT+MB+TROPI): Troponin I: 11.48 ng/mL (ref <0.30)

## 2011-05-03 LAB — PROTIME-INR: INR: 1.08 (ref 0.00–1.49)

## 2011-05-03 LAB — HEMOGLOBIN A1C
Hgb A1c MFr Bld: 5.6 % (ref <5.7)
Mean Plasma Glucose: 114 mg/dL (ref <117)

## 2011-05-04 LAB — CBC
HCT: 40.3 % (ref 39.0–52.0)
Platelets: 147 10*3/uL — ABNORMAL LOW (ref 150–400)
RDW: 14.3 % (ref 11.5–15.5)
WBC: 8.5 10*3/uL (ref 4.0–10.5)

## 2011-05-04 LAB — BASIC METABOLIC PANEL
BUN: 16 mg/dL (ref 6–23)
CO2: 27 mEq/L (ref 19–32)
Calcium: 8.3 mg/dL — ABNORMAL LOW (ref 8.4–10.5)
Chloride: 103 mEq/L (ref 96–112)
Creatinine, Ser: 0.97 mg/dL (ref 0.4–1.5)
GFR calc Af Amer: >60 mL/min (ref >60)
Glucose, Bld: 84 mg/dL (ref 70–99)

## 2011-05-04 LAB — HEPARIN LEVEL (UNFRACTIONATED): Heparin Unfractionated: 0.53 IU/mL (ref 0.30–0.70)

## 2011-05-04 LAB — CARDIAC PANEL(CRET KIN+CKTOT+MB+TROPI)
CK, MB: 23.9 ng/mL (ref 0.3–4.0)
Relative Index: 9.2 — ABNORMAL HIGH (ref 0.0–2.5)
Total CK: 158 U/L (ref 7–232)
Troponin I: 19.24 ng/mL (ref <0.30)
Troponin I: 15.79 ng/mL (ref <0.30)

## 2011-05-05 DIAGNOSIS — I2510 Atherosclerotic heart disease of native coronary artery without angina pectoris: Secondary | ICD-10-CM

## 2011-05-05 LAB — BASIC METABOLIC PANEL
BUN: 16 mg/dL (ref 6–23)
Chloride: 107 mEq/L (ref 96–112)
GFR calc non Af Amer: >60 mL/min (ref >60)
Glucose, Bld: 100 mg/dL — ABNORMAL HIGH (ref 70–99)
Potassium: 3.5 mEq/L (ref 3.5–5.1)
Sodium: 138 mEq/L (ref 135–145)

## 2011-05-05 LAB — CBC
HCT: 39.5 % (ref 39.0–52.0)
MCH: 31.2 pg (ref 26.0–34.0)
MCHC: 34.4 g/dL (ref 30.0–36.0)
MCV: 90.6 fL (ref 78.0–100.0)
Platelets: 145 10*3/uL — ABNORMAL LOW (ref 150–400)
RDW: 14.3 % (ref 11.5–15.5)

## 2011-05-05 LAB — HEPARIN LEVEL (UNFRACTIONATED): Heparin Unfractionated: 0.4 IU/mL (ref 0.30–0.70)

## 2011-05-06 NOTE — Consult Note (Signed)
NAMETREMAYNE, SHELDON                ACCOUNT NO.:  [id number]  MEDICAL RECORD NO.:  [id number]           PATIENT TYPE:  I  LOCATION:  2037                         FACILITY:  [facility]  PHYSICIAN:  Salvatore Decent. Cornelius Moras, M.D. DATE OF BIRTH:  [birth year]  DATE OF CONSULTATION:  05/05/2011 DATE OF DISCHARGE:                                CONSULTATION   REQUESTING PHYSICIAN:  Nanetta Batty, MD.  REASON FOR CONSULTATION:  Severe three-vessel coronary artery disease with vein graft disease, status post coronary artery bypass grafting.  HISTORY OF PRESENT ILLNESS:  Mr. Coole is an 77 year old African American gentleman from [place] with longstanding history of coronary artery disease, hypertension, tobacco abuse, syncope with tachybrady syndrome, status post permanent pacemaker placement, peripheral vascular disease, cerebrovascular disease, hyperlipidemia, and degenerative arthritis. The patient underwent coronary artery bypass grafting in 2000.  Details of that operation are not currently available.  The patient did well following this, although he did ultimately require placement of permanent pacemaker in 2003 for tachybrady syndrome with history of syncope.  In June 2011, he suffered an acute non-ST-segment elevation myocardial infarction.  Cardiac catheterization was performed at that time demonstrating progressive native three-vessel coronary artery disease as well as vein graft disease.  Medical therapy was recommended. The patient had done quite well until 3 days ago when he developed new- onset atypical chest discomfort radiating to his right arm.  He presented to the emergency room and cardiac enzymes were elevated consistent with an acute non-ST-segment elevation myocardial infarction. He was hospitalized and has remained completely stable on medical therapy since then.  He underwent cardiac catheterization yesterday by Dr. Daphene Jaeger.  Findings were similar to the catheterization  from 2011 with some progression of vein graft disease.  Left ventricular function is moderately reduced with ejection fraction estimated 45%. Percutaneous coronary intervention is felt not favorable and cardiothoracic surgical consultation was requested to consider redo coronary artery bypass grafting.  REVIEW OF SYSTEMS:  The patient reports overall feeling well.  Appetite is stable.  CARDIAC:  Notable for the absence of any history of chest pain, chest pressure, chest tightness prior to single episode which lasted several hours and prompted hospitalization 3 days ago.  The patient, otherwise, has remained physically active for his age and has not had any problems with chest discomfort nor shortness of breath with activity.  He does have occasional dizzy spells, particularly if he stands up suddenly.  He denies PND, orthopnea, or lower extremity edema. He denies any shortness of breath associated with the chest discomfort he suffered the other day.  He has not had any further chest discomfort since hospitalization.  RESPIRATORY:  Negative.  The patient denies productive cough, hemoptysis, wheezing.  He does continue smoking. GASTROINTESTINAL:  Negative.  Musculoskeletal:  Notable for mild degenerative arthritis.  This does not seem to slow him down too much. NEUROLOGIC:  Notable in that the patient does have some mild short-term memory loss, but this does not seem to limit him much.  INFECTIOUS: Negative.  PAST MEDICAL HISTORY: 1. Coronary artery disease. 2. Hypertension. 3. Peripheral vascular disease. 4. Cerebrovascular disease with history  of previous stroke. 5. Degenerative disk disease. 6. Hyperlipidemia. 7. Tachybrady syndrome, status post permanent pacemaker.  PAST SURGICAL HISTORY: 1. Coronary artery bypass grafting in 2000. 2. Right femoral-popliteal bypass. 3. Bilateral iliac artery stents.  FAMILY HISTORY:  Noncontributory.  SOCIAL HISTORY:  The patient is married  and lives with his wife.  He has a very supportive family.  He continues to smoke.  He does not use alcohol.  He remains reasonably active for his age.  MEDICATIONS PRIOR TO ADMISSION:  Lisinopril, amlodipine, tamsulosin, hydrochlorothiazide, Imdur.  DRUG ALLERGIES:  None known.  PHYSICAL EXAMINATION:  GENERAL:  The patient is a thin, elderly male who appears his stated age, in no acute distress. HEENT:  Unrevealing. NECK:  There is no lymphadenopathy.  There is no jugular venous distention. RESPIRATORY:  Auscultation of the chest reveals clear breath sounds anteriorly.  No wheezes, rales, or rhonchi are noted.  There is a well- healed median sternotomy scar. CARDIOVASCULAR:  Notable for regular rate and rhythm.  No murmurs, rubs, or gallops are appreciated. ABDOMEN:  Soft, nontender. EXTREMITIES:  Warm and well perfused.  There is no lower extremity edema.  Multiple scars and bilateral thighs from previous vein harvest presumably on the left as well as right femoral-popliteal bypass.  There are no scars below the knee.  Distal pulses are not palpable in either lower leg or at the ankle.  There is no sign of venous insufficiency. RECTAL AND GU:  Both deferred. NEUROLOGIC:  Grossly nonfocal and symmetrical throughout bilaterally.  DIAGNOSTIC TEST:  Cardiac catheterization performed by Dr. Tresa Endo is reviewed.  This demonstrates severe three-vessel coronary artery disease with vein graft disease status post coronary artery bypass grafting and moderate left ventricular dysfunction.  There is 90% proximal stenosis of the left anterior descending coronary artery prior to 100% occlusion of this vessel after takeoff of the first septal perforating branch. There is patent left internal mammary artery to the distal left anterior descending coronary artery which feeds majority of the anterior wall in the apex.  The distal left anterior descending coronary artery wraps the apex.  There is  some diffuse disease, but overall this is an important patent graft that will likely keep this patient alive for some time in the future.  There is 100% proximal occlusion of the right coronary artery with 100% chronic occlusion of the vein graft to the right coronary system.  There is 100% occlusion of the left circumflex coronary artery.  There is a patent vein graft to obtuse marginal which is diffusely diseased and has 90% ostial stenosis of the vein graft. The target vessels of this vein graft inserts to a quite small diffusely disease.  There is also patent vein graft to the diagonal branch.  The diagonal branch appears to be slightly better quality target vessel but also remains somewhat small.  There is 90% narrowing in this vein graft as well.  There is a small infrarenal abdominal aortic aneurysm.  IMPRESSION:  Vein graft disease with severe native three-vessel coronary artery disease, status post coronary artery bypass grafting in 2000. This elderly gentleman has a patent mammary to the left anterior descending which is without significant flow-limiting disease and will likely keep this gentleman alive for a quite some time in the future. The target vessels for the 2 remaining patent vein grafts which are severely diseased and likely the cause of this gentleman's acute symptoms are quite poor and as such not good target vessels for repeat revascularization.  There  is no sign of any late filling of any branches of the distal right coronary system which might be graftable either.  In summary, because of extremely poor target vessels for grafting, repeat surgical intervention would seem unwise in this elderly gentleman.  RECOMMENDATIONS:  I will recommend medical therapy.  I have discussed matters at length with Mr. Etchison and his family.  He understands and all of his questions have been addressed.     Salvatore Decent. Cornelius Moras, M.D.     CHO/MEDQ  D:  05/05/2011  T:  05/06/2011   Job:  [id number]  cc:   Nanetta Batty, M.D. Richard A. [name], M.D.  Electronically Signed by Tressie Stalker M.D. on 05/06/2011 07:38:58 AM

## 2011-05-20 NOTE — Cardiovascular Report (Signed)
NAMEKUZEY, OGATA NO.:  [id number]  MEDICAL RECORD NO.:  [id number]           PATIENT TYPE:  I  LOCATION:  2304                         FACILITY:  [facility]  PHYSICIAN:  Nicki Guadalajara, M.D.     DATE OF BIRTH:  12-20-1926  DATE OF PROCEDURE:  05/04/2011 DATE OF DISCHARGE:                           CARDIAC CATHETERIZATION   [name] is an 77 year old African American male who is status post CABG revascularization surgery in 2000.  He is status post permanent pacemaker insertion in 2003 for tachybrady syndrome.  He has an ejection fraction of 45% with hypertension.  In June 2011, he suffered a non-STEMI MI and underwent catheterization.  There was found to have an occluded graft to his RCA, significant aneurysmal dilatation to the graft to the diagonal and a severely diseased graft to the circumflex.  He had patent LIMA.  Medical therapy was recommended.  The patient was admitted to [facility] on May 02, 2011, then subsequently ruled in for an MI.  Troponin has increased to 19.24.  He now presents for cardiac catheterization.  Of note, the patient also has peripheral vascular disease and he is status post right fem-pop bypass surgery in 2009 and is status post bilateral iliac stenting.  PROCEDURE:  After premedication with Versed 1 mg plus fentanyl 25 mcg, the patient was prepped and draped in usual fashion.  His left femoral artery was punctured anteriorly and a 5-French sheath was inserted. There was initial difficulty in cannulating the wire of the left femoral artery.  The sheath was then inserted.  A Glidewire was then successfully able to navigate via an RCA catheter.  Consequently, initial injection was done of the native right coronary artery which revealed this to be no change in the previous total occlusion.  The right catheter was then used for selective angiography into the vein grafts which previously supplied the RCA, more superior  one which had supplied the circumflex marginal vessel.  This was unsuccessful in cannulating the graft which supplied the diagonal vessel but ultimately using exchange wire technique for all catheter exchanges with a long 260- mm wire the diagonal graft was done with a left bypass catheter.  This catheter was unsuccessful in potentially cannulating up to subclavian, but ultimately this was successful with a LIMA catheter.  It was difficult to selectively engage the left internal mammary artery but ultimately visualization was adequate to the LIMA graft.  Attention was then directed at the native coronary artery.  Then using change wire technique left 6-French FL-4 catheter was used which visualized the native left circulation.  This catheter was then removed.  A 6-French pigtail catheter was inserted.  There was significant difficulty in crossing the aortic valve but ultimately this was successful.  RAO ventriculography was performed.  With the patient's significant peripheral vascular history, a distal aortography was also done. Hemostasis was obtained by direct manual pressure.  The patient tolerated the procedure well.  He returned to his room in satisfactory condition.  HEMODYNAMIC DATA:  Central aortic pressure is 147/61.  Left ventricular pressure 147/20.  ANGIOGRAPHIC DATA:  Left  main coronary artery was a normal vessel which bifurcated into the LAD and left circumflex system.  The LAD had proximal 90% stenosis before the first septal perforating artery.  After giving rise to tiny diagonal vessel, the LAD was totally occluded proximally.  There was collateralization from the septal artery supplying the distal RCA.  The circumflex vessel was totally occluded after an apparent marginal branch.  The right coronary artery had 90% proximal stenosis and was totally occluded in its midsection.  There was a very faint antegrade collaterals via bridging collaterals and mild  left-to-right collaterals supplying the distal RCA territory.  The vein graft which supplied the right coronary artery was totally occluded at its origin.  The superior graft was severely diseased supplying the marginal vessel. It appeared to have at least 95 to possibly 99% ulcerated plaque stenosis right at the ostium.  The vessel was then diffusely diseased with aneurysmal dilatations and narrowings of 90% and supplied the distal circumflex marginal vessel.  The vein graft supplying the diagonal vessel was markedly aneurysmal initiating right in its origin and then had narrowings up to 90% that supplied the diagonal vessel.  The LIMA graft was patent and supplied the mid LAD.  Left ventriculography revealed ejection fraction of approximately 45%. There was moderately severe mid distal inferior hypocontractility.  Distal aortography revealed patent renal arteries.  The infrarenal aorta was aneurysmally dilated.  There seemed to be aneurysmal dilatation of the iliacs.  Visualization was suboptimal to further visualize beyond the common iliac arteries.  IMPRESSION: 1. Mild left ventricular dysfunction with an ejection fraction of 45%    but with moderately severe mid, distal and inferior     hypocontractility. 2. Severe native coronary artery disease with 90% proximal left     anterior descending (coronary) artery stenosis before the septal     perforating artery which collateralizes the distal portion of the     right coronary artery and total occlusion of the left anterior     descending (coronary) artery after proximal diagonal vessel. 3. Total occlusion of the arteriovenous groove circumflex after the     marginal vessel. 4. Total occlusion of the mid right coronary artery with faint     antegrade bridging collaterals and more prominent septal     collaterals arising from the left circulation distally. 5.  Total     occlusion of the vein graft which had supplied the right  coronary     artery. 5. Severe ulcerated plaque stenosis of 95% at the ostium of the graft     which is markedly diseased with aneurysmal dilatations and     narrowings as well as 90% in a graft which supplied the marginal     vessel. 6. Severe aneurysmal dilatation of the graft supplying the diagonal     vessel with narrowings up to 90%. 7. Patent left internal mammary artery to the left anterior descending     (coronary) artery. 8. Moderate infrarenal aortic aneurysm with suggestion of extension     into the iliac arteries bilaterally with poor visualization     distally.  RECOMMENDATIONS:  Mr. Selvidge cineangiograms will be reviewed with colleagues.  He does have a patent LIMA to the LAD.  I suspect his positive MI maybe secondary to his ulcerated plaque at the ostium of the graft which does not seem revascularizable by percutaneous intervention. Discussion will be made concerning surgical consultation.  In the interim, the patient will be aggressively hydrated and increase medical therapy  if necessary.          ______________________________ Nicki Guadalajara, M.D.     TK/MEDQ  D:  05/04/2011  T:  05/05/2011  Job:  [id number]  cc:   Gerlene Burdock A. [name], M.D.  Electronically Signed by Nicki Guadalajara M.D. on 05/20/2011 04:13:44 PM

## 2011-05-28 NOTE — Discharge Summary (Signed)
NAMEELGIN, CARN                ACCOUNT NO.:  [id number]  MEDICAL RECORD NO.:  [id number]           PATIENT TYPE:  I  LOCATION:  2037                         FACILITY:  [facility]  PHYSICIAN:  Sharona Rovner A. [name], M.D.DATE OF BIRTH:  1927/07/15  DATE OF ADMISSION:  05/02/2011 DATE OF DISCHARGE:  05/06/2011                              DISCHARGE SUMMARY   DISCHARGE DIAGNOSES: 1. Non-ST-elevation myocardial infarction with a peak troponin of 19.4     on May 04, 2011. 2. Known coronary artery disease, coronary artery bypass grafting in     2000 with catheterization this admission revealing diffuse disease,     seen in consult by CVTS, plan is for continued medical therapy. 3. Mild left ventricular dysfunction with an ejection fraction of 45%     at catheterization. 4. History of sick sinus syndrome, status post [employer] pacemaker in     2003. 5. Vascular disease with previous right fem-pop bypass grafting in     2009. 6. Treated hypertension. 7. Treated dyslipidemia. 8. History of smoking, counseled this admission.  HOSPITAL COURSE:  The patient is a pleasant 77 year old male followed by Dr. [name].  He lives at home with his wife and has family that checks on him almost every day.  He was admitted on May 02, 2011 with unstable angina, his troponins subsequently became positive with a peak of 19.4.  His EKG showed paced rhythm.  He was put on heparin and nitrates on admission.  It was decided to proceed with diagnostic catheterization, which was done on May 04, 2011.  See Dr. Landry Dyke note for complete details.  The vein graft to the RCA was occluded.  The LIMA to the LAD was patent.  There was a vein graft to an OM that was ectatic, but patent.  There were faint left-to-right collaterals.  Dr. Allyson Sabal asked Dr. Cornelius Moras to see the patient in consult for possible redo bypass grafting.  The patient was seen and interviewed by Dr. Cornelius Moras and felt medical therapy would be the best choice  for this patient.  We have increased the patient's medical therapy and he is doing well.  We feel he can be discharged on May 06, 2011.  He has been counseled about smoking and will consider stopping.  He will follow up with Dr. [name] in a week or two.  LABORATORY DATA AT DISCHARGE:  Sodium 138, potassium 3.5, BUN 16, creatinine 1.02.  White count 8.9, hemoglobin 13.6, hematocrit 39.5, platelets 145.  CK peaked at 312 with 35 MBs.  Troponins peaked at 19.24.  Liver functions were normal.  TSH 0.8.  Hemoglobin A1c is 5.6. Chest x-ray shows basilar atelectasis.  His EKG is atrial paced.  Please see [facility] for complete discharge medications.  DISPOSITION:  The patient was discharged in stable condition and will follow up with Dr. [name].     Abelino Derrick, P.A.   ______________________________ Pearletha Furl [name], M.D.    Lenard Lance  D:  05/06/2011  T:  05/07/2011  Job:  [id number]  Electronically Signed by Corine Shelter P.A. on 05/10/2011 02:13:04 PM Electronically Signed by Susa Griffins M.D.  on 05/28/2011 10:18:33 AM

## 2011-06-17 ENCOUNTER — Inpatient Hospital Stay (HOSPITAL_COMMUNITY)
Admission: EM | Admit: 2011-06-17 | Discharge: 2011-06-25 | DRG: 035 | Disposition: A | Discharge status: Home / Self Care | Payer: Medicare Other | Attending: Family Medicine | Admitting: Family Medicine

## 2011-06-17 ENCOUNTER — Encounter (HOSPITAL_COMMUNITY): Payer: Self-pay | Admitting: Radiology

## 2011-06-17 ENCOUNTER — Emergency Department (HOSPITAL_COMMUNITY): Payer: Medicare Other

## 2011-06-17 DIAGNOSIS — Z7982 Long term (current) use of aspirin: Secondary | ICD-10-CM

## 2011-06-17 DIAGNOSIS — I739 Peripheral vascular disease, unspecified: Secondary | ICD-10-CM | POA: Diagnosis present

## 2011-06-17 DIAGNOSIS — R4701 Aphasia: Secondary | ICD-10-CM | POA: Diagnosis not present

## 2011-06-17 DIAGNOSIS — N179 Acute kidney failure, unspecified: Secondary | ICD-10-CM | POA: Diagnosis not present

## 2011-06-17 DIAGNOSIS — J4489 Other specified chronic obstructive pulmonary disease: Secondary | ICD-10-CM | POA: Diagnosis present

## 2011-06-17 DIAGNOSIS — I25810 Atherosclerosis of coronary artery bypass graft(s) without angina pectoris: Secondary | ICD-10-CM | POA: Diagnosis present

## 2011-06-17 DIAGNOSIS — Z79899 Other long term (current) drug therapy: Secondary | ICD-10-CM

## 2011-06-17 DIAGNOSIS — G459 Transient cerebral ischemic attack, unspecified: Secondary | ICD-10-CM | POA: Diagnosis not present

## 2011-06-17 DIAGNOSIS — D696 Thrombocytopenia, unspecified: Secondary | ICD-10-CM | POA: Diagnosis present

## 2011-06-17 DIAGNOSIS — Y9210 Unspecified residential institution as the place of occurrence of the external cause: Secondary | ICD-10-CM | POA: Diagnosis not present

## 2011-06-17 DIAGNOSIS — Z8673 Personal history of transient ischemic attack (TIA), and cerebral infarction without residual deficits: Secondary | ICD-10-CM

## 2011-06-17 DIAGNOSIS — I252 Old myocardial infarction: Secondary | ICD-10-CM

## 2011-06-17 DIAGNOSIS — Y849 Medical procedure, unspecified as the cause of abnormal reaction of the patient, or of later complication, without mention of misadventure at the time of the procedure: Secondary | ICD-10-CM | POA: Diagnosis not present

## 2011-06-17 DIAGNOSIS — Z950 Presence of cardiac pacemaker: Secondary | ICD-10-CM

## 2011-06-17 DIAGNOSIS — G969 Disorder of central nervous system, unspecified: Secondary | ICD-10-CM | POA: Diagnosis not present

## 2011-06-17 DIAGNOSIS — J449 Chronic obstructive pulmonary disease, unspecified: Secondary | ICD-10-CM | POA: Diagnosis present

## 2011-06-17 DIAGNOSIS — I10 Essential (primary) hypertension: Secondary | ICD-10-CM | POA: Diagnosis present

## 2011-06-17 DIAGNOSIS — I2510 Atherosclerotic heart disease of native coronary artery without angina pectoris: Secondary | ICD-10-CM | POA: Diagnosis present

## 2011-06-17 DIAGNOSIS — I214 Non-ST elevation (NSTEMI) myocardial infarction: Secondary | ICD-10-CM | POA: Diagnosis present

## 2011-06-17 DIAGNOSIS — I5022 Chronic systolic (congestive) heart failure: Secondary | ICD-10-CM | POA: Diagnosis present

## 2011-06-17 DIAGNOSIS — F17200 Nicotine dependence, unspecified, uncomplicated: Secondary | ICD-10-CM | POA: Diagnosis present

## 2011-06-17 DIAGNOSIS — I509 Heart failure, unspecified: Secondary | ICD-10-CM | POA: Diagnosis present

## 2011-06-17 DIAGNOSIS — R29898 Other symptoms and signs involving the musculoskeletal system: Secondary | ICD-10-CM | POA: Diagnosis not present

## 2011-06-17 DIAGNOSIS — I63239 Cerebral infarction due to unspecified occlusion or stenosis of unspecified carotid arteries: Principal | ICD-10-CM | POA: Diagnosis present

## 2011-06-17 DIAGNOSIS — E785 Hyperlipidemia, unspecified: Secondary | ICD-10-CM | POA: Diagnosis present

## 2011-06-17 DIAGNOSIS — I672 Cerebral atherosclerosis: Secondary | ICD-10-CM | POA: Diagnosis present

## 2011-06-17 HISTORY — DX: Atherosclerotic heart disease of native coronary artery without angina pectoris: I25.10

## 2011-06-17 HISTORY — DX: Essential (primary) hypertension: I10

## 2011-06-17 LAB — COMPREHENSIVE METABOLIC PANEL
ALT: 6 U/L (ref 0–53)
Albumin: 3 g/dL — ABNORMAL LOW (ref 3.5–5.2)
Alkaline Phosphatase: 77 U/L (ref 39–117)
Chloride: 103 mEq/L (ref 96–112)
Potassium: 4 mEq/L (ref 3.5–5.1)
Sodium: 140 mEq/L (ref 135–145)
Total Bilirubin: 0.3 mg/dL (ref 0.3–1.2)
Total Protein: 6.5 g/dL (ref 6.0–8.3)

## 2011-06-17 LAB — DIFFERENTIAL
Basophils Absolute: 0.1 10*3/uL (ref 0.0–0.1)
Basophils Relative: 1 % (ref 0–1)
Lymphocytes Relative: 49 % — ABNORMAL HIGH (ref 12–46)
Neutro Abs: 3.8 10*3/uL (ref 1.7–7.7)
Neutrophils Relative %: 42 % — ABNORMAL LOW (ref 43–77)

## 2011-06-17 LAB — CK TOTAL AND CKMB (NOT AT ARMC)
CK, MB: 1.8 ng/mL (ref 0.3–4.0)
Relative Index: INVALID (ref 0.0–2.5)

## 2011-06-17 LAB — URINALYSIS, ROUTINE W REFLEX MICROSCOPIC
Bilirubin Urine: NEGATIVE
Ketones, ur: NEGATIVE mg/dL
Leukocytes, UA: NEGATIVE
Nitrite: NEGATIVE
Specific Gravity, Urine: 1.013 (ref 1.005–1.030)
Urobilinogen, UA: 1 mg/dL (ref 0.0–1.0)
pH: 6 (ref 5.0–8.0)

## 2011-06-17 LAB — POCT I-STAT, CHEM 8
Chloride: 104 mEq/L (ref 96–112)
Creatinine, Ser: 1.5 mg/dL — ABNORMAL HIGH (ref 0.50–1.35)
Glucose, Bld: 95 mg/dL (ref 70–99)
Hemoglobin: 14.6 g/dL (ref 13.0–17.0)
Potassium: 4.1 mEq/L (ref 3.5–5.1)
Sodium: 142 mEq/L (ref 135–145)

## 2011-06-17 LAB — CBC
HCT: 40.1 % (ref 39.0–52.0)
Hemoglobin: 13.8 g/dL (ref 13.0–17.0)
MCHC: 34.4 g/dL (ref 30.0–36.0)
RBC: 4.36 MIL/uL (ref 4.22–5.81)
WBC: 8.9 10*3/uL (ref 4.0–10.5)

## 2011-06-17 LAB — APTT: aPTT: 34 seconds (ref 24–37)

## 2011-06-17 LAB — URINE MICROSCOPIC-ADD ON

## 2011-06-17 LAB — PROTIME-INR: INR: 1.06 (ref 0.00–1.49)

## 2011-06-18 DIAGNOSIS — G459 Transient cerebral ischemic attack, unspecified: Secondary | ICD-10-CM

## 2011-06-18 LAB — URINE DRUGS OF ABUSE SCREEN W ALC, ROUTINE (REF LAB)
Cocaine Metabolites: NEGATIVE
Creatinine,U: 108.3 mg/dL
Ethyl Alcohol: <10 mg/dL (ref <10)
Methadone: NEGATIVE
Opiate Screen, Urine: NEGATIVE
Phencyclidine (PCP): NEGATIVE

## 2011-06-18 LAB — HEMOGLOBIN A1C
Hgb A1c MFr Bld: 5.9 % — ABNORMAL HIGH (ref <5.7)
Mean Plasma Glucose: 123 mg/dL — ABNORMAL HIGH (ref <117)

## 2011-06-18 LAB — LIPID PANEL
LDL Cholesterol: 62 mg/dL (ref 0–99)
Total CHOL/HDL Ratio: 2.6 RATIO
VLDL: 14 mg/dL (ref 0–40)

## 2011-06-19 ENCOUNTER — Inpatient Hospital Stay (HOSPITAL_COMMUNITY): Payer: Medicare Other

## 2011-06-19 DIAGNOSIS — I63239 Cerebral infarction due to unspecified occlusion or stenosis of unspecified carotid arteries: Secondary | ICD-10-CM

## 2011-06-19 LAB — BASIC METABOLIC PANEL
CO2: 30 mEq/L (ref 19–32)
Calcium: 8.7 mg/dL (ref 8.4–10.5)
Creatinine, Ser: 1.28 mg/dL (ref 0.50–1.35)
GFR calc non Af Amer: 54 mL/min — ABNORMAL LOW (ref >60)

## 2011-06-19 MED ORDER — IOHEXOL 350 MG/ML SOLN: 50.0000 mL | Freq: Once | INTRAVENOUS | Status: AC | PRN

## 2011-06-20 DIAGNOSIS — I63239 Cerebral infarction due to unspecified occlusion or stenosis of unspecified carotid arteries: Secondary | ICD-10-CM

## 2011-06-21 DIAGNOSIS — I63239 Cerebral infarction due to unspecified occlusion or stenosis of unspecified carotid arteries: Secondary | ICD-10-CM

## 2011-06-21 NOTE — Consult Note (Signed)
[name], [name]                ACCOUNT NO.:  [id number]  MEDICAL RECORD NO.:  [id number]  LOCATION:  3028                         FACILITY:  [facility]  PHYSICIAN:  Levie Heritage, MD       DATE OF BIRTH:  02-Oct-1927  DATE OF CONSULTATION:  06/18/2011 DATE OF DISCHARGE:                                CONSULTATION   REASON FOR CONSULTATION:  Right-sided facial, hand, and leg tingling, possibility of left thalamic stroke.  HISTORY OF PRESENT ILLNESS:  This is a pleasant 77 year old African American gentleman with history of CAD status post recent MI, history of sick sinus syndrome, status post pacemaker along with congestive heart failure, hypertension, hyperlipidemia, tobacco abuse, peripheral vascular disease, status post right fem-pop bypass grafting in 2009, in addition a history of CVA in 29s.  The patient presents to the hospital on June 17, 2011 secondary to right face, arm, and leg numbness, which began approximately at 10 a.m.Marland Kitchen  At that time, the patient did not think much of it, drink glass of water, and stayed at home.  By 3 p.m. because of numbness was still present, the patient called his PCP, he told him to come to the hospital for evaluation.  The patient continued to wait around the house for another hour just before he decided to call her doctor, who again advised him to come to the hospital.  At 5 o'clock in the evening on July 12, the patient was finally brought to the hospital for evaluation.  After being admitted, the patient's symptoms improved.  At present time on June 18, 2011 at 10 a.m. the patient's symptoms have now all resolved.  At the time of consultation, the patient's stroke workup had already been initiated. The patient's CT scan of head was negative and Physical Therapy had seen the patient and walked him in the halls.  PAST MEDICAL HISTORY:  As mentioned above.  CURRENT MEDICATIONS:  The patient is on include Norvasc, Aggrenox, Lovenox, Imdur,  Prinivil, Lopressor, potassium chloride, vitamin B6, Crestor, Flomax, Maxzide, Tylenol, insulin, morphine p.r.n., nitroglycerin p.r.n., Zofran p.r.n., oxycodone p.r.n.  Of note, I have just switched his Aggrenox to Plavix.  ALLERGIES:  No known drug allergies.  REVIEW OF SYSTEMS:  Negative with the exception of above.  PHYSICAL EXAM:  VITAL SIGNS:  Temperature 97.6, pulse 78, respirations 18, blood pressures is 170/84. GENERAL:  The patient is alert and oriented x3, carries out two- and three-step commands.  The patient has no complaints at this time.  His systolic symptoms have resolved. CRANIAL NERVES:  Pupils are equal, round, reactive to light, and accommodating 4 mm to 3 mm, conjugate gaze.  Extraocular movements are intact.  Visual fields grossly intact.  Face is symmetrical.  Tongue is midline.  Uvula is midline.  The patient shows no dysarthria.  No aphasia.  No slurred speech.  Facial sensation is full to pinprick, light touch.  Shoulder shrug and head turn within normal limits.  Finger- to-nose and heel-to-shin are smooth.  Motor, the patient shows 5/5 strength.  No drift in the upper or lower extremity.  No asterixis or tremor.  Deep tendon reflexes are 1+ throughout.  Downgoing toes  bilaterally.  Sensation, full to pinprick, light touch, and vibration. PULMONARY:  Clear to auscultation. CARDIOVASCULAR:  S1-S2 is audible. NECK:  Negative for bruits.  Sensation throughout is full to pinprick, light touch.  LABS:  Cholesterol 125, triglycerides 72, HDL 49, LDL is 62.  Urine drug screen pending.  HbA1c pending.  White blood cell 8.9, hemoglobin 13.8, hematocrit 40.1, platelet count 130.  INR is 1.06.  Sodium 140, potassium 4.0, chloride 103, CO2 is 30, glucose 95, BUN 18, creatinine is 1.45.  CT of head was negative.  ASSESSMENT:  Clinical findings and symptoms suggest ischemic event in the left thalamic region. I cannot rule out thalamic stroke on clinical grounds  alone, Additionally, symptomatology points towards small vessel disease.  Agree with placing the patient on an antiplatelet agent as the patient does have multiple risk factors for stroke.  If the patient's creatinine would allow, we would recommend getting a CT angiogram of head and neck as MRI is not possible secondary to pacemaker.  We would also recommend repeating a CT of head in a couple days to see if the stroke shows evolution and is able to be visualized on CT scan of head.  Dr. Hoy Morn seen and evaluated the patient and agrees with the above- mentioned.     Felicie Morn, PA-C  I have examined the patient and agree with above clinical findings. ______________________________ Levie Heritage, MD    DS/MEDQ  D:  06/18/2011  T:  06/18/2011  Job:  [id number]  Electronically Signed by Felicie Morn PA-C on 06/21/2011 11:20:41 AM Electronically Signed by Levie Heritage MD on 06/21/2011 09:19:23 PM

## 2011-06-22 ENCOUNTER — Inpatient Hospital Stay (HOSPITAL_COMMUNITY): Payer: Medicare Other

## 2011-06-22 LAB — BASIC METABOLIC PANEL
Calcium: 8.6 mg/dL (ref 8.4–10.5)
Chloride: 101 mEq/L (ref 96–112)
Creatinine, Ser: 1.33 mg/dL (ref 0.50–1.35)
GFR calc Af Amer: >60 mL/min (ref >60)
Sodium: 136 mEq/L (ref 135–145)

## 2011-06-22 LAB — CBC
MCV: 91 fL (ref 78.0–100.0)
Platelets: 139 10*3/uL — ABNORMAL LOW (ref 150–400)
RDW: 14.4 % (ref 11.5–15.5)
WBC: 8.8 10*3/uL (ref 4.0–10.5)

## 2011-06-23 ENCOUNTER — Inpatient Hospital Stay (HOSPITAL_COMMUNITY): Payer: Medicare Other

## 2011-06-23 DIAGNOSIS — I6529 Occlusion and stenosis of unspecified carotid artery: Secondary | ICD-10-CM

## 2011-06-23 HISTORY — PX: CAROTID STENT: SHX1301

## 2011-06-23 LAB — CBC
HCT: 39.4 % (ref 39.0–52.0)
Hemoglobin: 13.5 g/dL (ref 13.0–17.0)
MCH: 31.4 pg (ref 26.0–34.0)
MCHC: 34.3 g/dL (ref 30.0–36.0)
MCHC: 35.3 g/dL (ref 30.0–36.0)
MCV: 91.6 fL (ref 78.0–100.0)
Platelets: 133 10*3/uL — ABNORMAL LOW (ref 150–400)
Platelets: 137 10*3/uL — ABNORMAL LOW (ref 150–400)
RBC: 4.3 MIL/uL (ref 4.22–5.81)
RDW: 14.7 % (ref 11.5–15.5)
RDW: 14.5 % (ref 11.5–15.5)
WBC: 9 10*3/uL (ref 4.0–10.5)
WBC: 8.9 10*3/uL (ref 4.0–10.5)

## 2011-06-23 LAB — PROTIME-INR
INR: 1.09 (ref 0.00–1.49)
INR: 2.28 — ABNORMAL HIGH (ref 0.00–1.49)
Prothrombin Time: 14.3 seconds (ref 11.6–15.2)
Prothrombin Time: 25.5 seconds — ABNORMAL HIGH (ref 11.6–15.2)

## 2011-06-23 LAB — COMPREHENSIVE METABOLIC PANEL
ALT: 7 U/L (ref 0–53)
Alkaline Phosphatase: 66 U/L (ref 39–117)
BUN: 24 mg/dL — ABNORMAL HIGH (ref 6–23)
Chloride: 105 mEq/L (ref 96–112)
GFR calc Af Amer: >60 mL/min (ref >60)
Glucose, Bld: 85 mg/dL (ref 70–99)
Potassium: 4.1 mEq/L (ref 3.5–5.1)
Sodium: 136 mEq/L (ref 135–145)
Total Bilirubin: 0.5 mg/dL (ref 0.3–1.2)

## 2011-06-23 LAB — BASIC METABOLIC PANEL
CO2: 28 mEq/L (ref 19–32)
Calcium: 8.7 mg/dL (ref 8.4–10.5)
GFR calc Af Amer: >60 mL/min (ref >60)
GFR calc non Af Amer: 54 mL/min — ABNORMAL LOW (ref >60)
Sodium: 139 mEq/L (ref 135–145)

## 2011-06-23 LAB — POCT ACTIVATED CLOTTING TIME
Activated Clotting Time: 447 seconds
Activated Clotting Time: 265 seconds
Activated Clotting Time: 199 seconds

## 2011-06-23 LAB — MRSA PCR SCREENING: MRSA by PCR: NEGATIVE

## 2011-06-23 LAB — APTT: aPTT: 122 seconds — ABNORMAL HIGH (ref 24–37)

## 2011-06-23 NOTE — Consult Note (Signed)
[name], [name]                ACCOUNT NO.:  [id number]  MEDICAL RECORD NO.:  [id number]  LOCATION:  3028                         FACILITY:  [facility]  PHYSICIAN:  Quita Skye. Hart Rochester, M.D.  DATE OF BIRTH:  25-Sep-1927  DATE OF CONSULTATION:  06/19/2011 DATE OF DISCHARGE:                                CONSULTATION   Referred by the [facility].  CHIEF COMPLAINT:  Left brain TIA versus small stroke with carotid occlusive disease.  HISTORY OF PRESENT ILLNESS:  This 77 year old male patient 2 days ago at 10 a.m. developed symptoms in his right foot which he described as numbness and weakness.  This then progressed to involve his right upper extremity and his right perioral area which was numb and weak.  He was unable to ambulate normally.  He went to the hospital and within 24 hours his symptoms had essentially resolved other than some persistent numbness in a few areas in the right side of his face and a few fingers. Does have a long and remote history of a left brain stroke in 1990. Evaluation in the hospital revealed a carotid duplex exam positive for a 70-75% left internal carotid stenosis with a thin, irregular plaque.  CT angiogram also suggested a severe left internal carotid stenosis.  PAST MEDICAL HISTORY:  Chronic problems: 1. Sick sinus syndrome with pacemaker in place. 2. Coronary artery disease previous coronary artery bypass grafting in     2000 and recent non-STEMI in May 2012. 3. Hypertension. 4. Hyperlipidemia. 5. History of tobacco abuse, remote. 6. Peripheral vascular disease previous right femoral-popliteal bypass     grafting by Dr. [name] in 2009.  SOCIAL HISTORY:  The patient denies any alcohol or illicit drug use. Smoked a pack of cigarettes per day for about 60 years.  Does not currently smoke.  FAMILY HISTORY:  Positive for coronary artery disease in his mother, lung cancer in a brother.  REVIEW OF SYSTEMS:  Denies any chest pain, dyspnea on  exertion.  Does not ambulate long distances.  All other systems are negative in complete review of systems.  PHYSICAL EXAMINATION:  VITAL SIGNS:  Blood pressure 134/70, heart rate 70, respirations 14. GENERAL:  He is a well-developed, well-nourished elderly male in no apparent distress, alert and oriented x3. HEENT:  Normal for age.  EOMs intact.  He has arcus senilis. LUNGS:  Clear to auscultation.  No rhonchi or wheezing. CARDIOVASCULAR:  Regular rhythm.  No murmurs.  Carotid pulses are 3+. ABDOMEN:  Soft, nontender with no masses. LOWER EXTREMITY:  Right leg have 3+ femoral popliteal pulse.  Left leg with 2+ femoral pulse.  Both feet are well-perfused.  No distal edema. MUSCULOSKELETAL:  Free of major deformities. NEUROLOGIC:  Very subtle numbness on the right side around his perioral region and in the fingers.  Excellent strength. SKIN:  Free of rashes.  I have reviewed the carotid duplex exam and CT angiogram and agree that he does have significant left carotid disease.  IMPRESSION:  Left brain event transient ischemic attack versus small stroke in left thalamic area with history of left brain stroke 22 years ago and severe left carotid occlusive disease.  The patient needs  left carotid endarterectomy if he is a surgical candidate.  Would like an opinion from Dr. [name], his cardiologist in light of the non-Q myocardial infarction which occurred 2 months ago.  If he is not a surgical candidate, then consideration for carotid stenting would be made.     Quita Skye Hart Rochester, M.D.     JDL/MEDQ  D:  06/19/2011  T:  06/20/2011  Job:  [id number]  Electronically Signed by Josephina Gip M.D. on 06/23/2011 03:05:14 PM

## 2011-06-24 ENCOUNTER — Inpatient Hospital Stay (HOSPITAL_COMMUNITY): Payer: Medicare Other

## 2011-06-24 LAB — COMPREHENSIVE METABOLIC PANEL
ALT: 8 U/L (ref 0–53)
Alkaline Phosphatase: 64 U/L (ref 39–117)
BUN: 27 mg/dL — ABNORMAL HIGH (ref 6–23)
CO2: 27 mEq/L (ref 19–32)
GFR calc Af Amer: 56 mL/min — ABNORMAL LOW (ref >60)
GFR calc non Af Amer: 47 mL/min — ABNORMAL LOW (ref >60)
Glucose, Bld: 107 mg/dL — ABNORMAL HIGH (ref 70–99)
Potassium: 4 mEq/L (ref 3.5–5.1)
Sodium: 140 mEq/L (ref 135–145)
Total Protein: 5.9 g/dL — ABNORMAL LOW (ref 6.0–8.3)

## 2011-06-24 LAB — CBC
HCT: 36.9 % — ABNORMAL LOW (ref 39.0–52.0)
Hemoglobin: 12.7 g/dL — ABNORMAL LOW (ref 13.0–17.0)
MCH: 31.4 pg (ref 26.0–34.0)
MCHC: 34.4 g/dL (ref 30.0–36.0)
RBC: 4.04 MIL/uL — ABNORMAL LOW (ref 4.22–5.81)

## 2011-06-25 LAB — COMPREHENSIVE METABOLIC PANEL
ALT: 7 U/L (ref 0–53)
AST: 15 U/L (ref 0–37)
Albumin: 2.6 g/dL — ABNORMAL LOW (ref 3.5–5.2)
Alkaline Phosphatase: 60 U/L (ref 39–117)
Chloride: 105 mEq/L (ref 96–112)
Potassium: 3.8 mEq/L (ref 3.5–5.1)
Sodium: 139 mEq/L (ref 135–145)
Total Bilirubin: 0.4 mg/dL (ref 0.3–1.2)
Total Protein: 5.9 g/dL — ABNORMAL LOW (ref 6.0–8.3)

## 2011-06-25 LAB — CBC
MCHC: 33.8 g/dL (ref 30.0–36.0)
Platelets: 125 10*3/uL — ABNORMAL LOW (ref 150–400)
RDW: 14.6 % (ref 11.5–15.5)
WBC: 8.9 10*3/uL (ref 4.0–10.5)

## 2011-06-25 NOTE — Consult Note (Signed)
  NAMEMIVAAN, CORBITT                ACCOUNT NO.:  [id number]  MEDICAL RECORD NO.:  [id number]  LOCATION:  3031                         FACILITY:  [facility]  PHYSICIAN:  Jance Siek K. Burnham Trost, M.D.DATE OF BIRTH:  20-Dec-1926  DATE OF CONSULTATION: DATE OF DISCHARGE:                                CONSULTATION   CLINICAL HISTORY:  Patient with bilateral carotid artery disease, worse on the left.  History of TIA.  Examination is intracranial interpretation of bilateral carotid arteriograms.  The right common carotid arteriogram demonstrates a moderate 50% stenosis of the distal cervical portion of the right internal carotid artery at the skull base.  The petrous and the proximal cavernous segment is normal.  The  distal cavernous segment demonstrates a moderate stenosis as well.  Distal to this, a supraclinoid segment is normal.  The right middle cerebral artery demonstrates approximately 70% stenosis of the right M1 segment.  Distal to this, the  trifurcation branches are normal.  The right anterior cerebral artery opacifies  proximally.  Inflow of unopacified blood is seen in the right A2 region.  The subsequent capillary and venous phases are normal.  The left common carotid arteriogram demonstrates the cervico-petrous junction to be mildly narrowed secondary to atherosclerotic plaque.  The supraclinoid segment is normal.  The left middle cerebral artery proximally has a mild circumferential stenosis.  The trifurcation branches are normal with opacification  into capillary and venous phases.  The left anterior cerebral artery proximally and A2 segment is  normal .  Transient opacification via the anterior communicating artery of the right anterior cerebral artery distal to the A2 segment is noted.  Post-stent placement in the left internal carotid artery proximally demonstrates the ICA to be normal.  There is a truncation of the angular  branch of the left middle cerebral  artery with a moderate-sized hypoperfusion deficit involving the left posterior frontal, anterior parietal, cortical and subcortical white matter.  IMPRESSION: 1. Angiographically, the post-stent placement arteriogram     intracranially demonstrates a truncation with angiographic     occlusion of the distal angular branch of the left middle cerebral     artery with a secondary hypoperfusion deficit involving the     cortical subcortical region as described above. 2. Mild atherosclerotic disease involving the left internal carotid     artery in the cavernous region. 3. Approximately  70% stenosis of right middle cerebral artery M1     segment. 4. Moderate atherosclerotic disease involving the distal right     internal carotid artery, cervical region and at the skull base as     described.          ______________________________ Grandville Silos [name], M.D.     SKD/MEDQ  D:  06/24/2011  T:  06/25/2011  Job:  [id number]  Electronically Signed by Julieanne Cotton M.D. on 06/25/2011 10:23:23 AM

## 2011-06-28 NOTE — Discharge Summary (Signed)
NAMEKONRAD, [name]                ACCOUNT NO.:  [id number]  MEDICAL RECORD NO.:  [id number]  LOCATION:  3031                         FACILITY:  [facility]  PHYSICIAN:  Standley Dakins, MD   DATE OF BIRTH:  [birth year]  DATE OF ADMISSION:  06/17/2011 DATE OF DISCHARGE:  06/25/2011                              DISCHARGE SUMMARY   PRIMARY CARE PHYSICIAN:  Marjory Lies, M.D.  NEUROLOGIST:  Pramod P. Pearlean Brownie, MD  INTERVENTIONAL CARDIOLOGIST AND VASCULAR SPECIALIST: 1. Charlena Cross, MD  CARDIOLOGIST:  Dr. Allyson Sabal at [facility] and Vascular Specialist  DISCHARGE DIAGNOSES: 1. Status post stent placement in the left internal carotid artery,     proximally. 2. Peripheral vascular disease. 3. Bilateral carotid artery disease. 4. Transient ischemic attack. 5. Coronary artery disease. 6. Hypertension. 7. Acute renal failure -- resolved. 8. Hyperlipidemia. 9. Chronic active nicotine dependence. 10.Sick sinus syndrome status post pacemaker. 11.Status post recent cerebrovascular accident with residual right-     sided numbness. 12.Systolic heart failure -- chronic with ejection fraction of 45%,     grade 1 diastolic dysfunction. 13.Thrombocytopenia.  DISCHARGE MEDICATIONS: 1. Plavix 75 mg 1 p.o. daily. 2. Amlodipine 5 mg 1 p.o. daily. 3. Aspirin enteric-coated 81 mg p.o. daily. 4. Isosorbide mononitrate XR 60 mg 1 p.o. daily. 5. Lisinopril 20 mg 1 p.o. daily. 6. Metoprolol 50 mg 1 tablet by mouth twice daily. 7. Multivitamins 1 p.o. daily. 8. Nitroglycerin sublingual 0.4 mg 1 tablet under tongue every 5     minutes as needed up to 3 doses as needed. 9. Potassium chloride 20 mEq p.o. daily. 10.Rosuvastatin 20 mg one-half tablet p.o. daily at bedtime. 11.Tamsulosin 0.4 mg 1 capsule by mouth every morning. 12.Triamterene and hydrochlorothiazide 37.5/25 one tablet by mouth     twice weekly on Tuesday and Thursday.  HOSPITAL COURSE:  Briefly, this patient is an 77 year old  gentleman with vascular disease including coronary artery disease status post recent myocardial infarction, sick sinus syndrome status post pacemaker placement and peripheral vascular disease and cerebrovascular disease who presented to the hospital with right face, arm, and leg numbness. The patient was evaluated and found to have severe carotid artery disease and requires stenting of the left carotid artery.  The patient had the procedure done after Vascular Surgery consult was obtained.  The procedure was performed on June 23, 2011, it was done by Vascular Surgery.    He had a small complication of having TIA symptoms during the procedure requiring that he be sent to the neuro ICU and his symptoms quickly resolved.  He did have a repeat CT scan and Neurology consultation, but no acute stroke was found.  It was thought that he had a TIA episode during the procedure.  The patient was monitored.  His symptoms completely resolved and he was transferred to the floor.  He was maintained under telemetry monitoring and did very well.  However, the patient began to request to go home and aggressively wanted to go home.    He has close followup arranged with his specialty physicians and the stroke team cleared him for discharge, but they do recommend that the patient continue aspirin and Plavix and followup with  him as an outpatient.  The patient will also follow up with his cardiologist, Dr. Nanetta Batty in 2-3 weeks.  The patient was evaluated by Physical Therapy and determined to be appropriate for discharge home.  CONDITION ON DISCHARGE:  Stable.  DISPOSITION:  The patient will be discharged home with family members.  ACTIVITY:  Ad lib.  DIET:  Cardiac diet recommended.  FOLLOWUP:  The patient will be following with Dr. Pearlean Brownie, his neurologist in 2 months.  Followup with Dr. Allyson Sabal, his cardiologist in 2-3 weeks. Followup with his primary care physician in Dr. Doristine Counter in 1 week.   He is also going to followup with [name] in 1 month.  SPECIAL INSTRUCTIONS: 1. Return if symptoms recur, worsen, or new changes develop. 2. Please monitor blood pressure closely and followup with the     appointments as above. 3. Fall precautions recommended.   The patient was evaluated on the day of discharge.  He was evaluated on June 25, 2011. The patient was awake, alert, no  distress, eating well, and cooperative.  PHYSICAL EXAMINATION:  VITAL SIGNS:  Temperature 98.2, pulse 74, respirations 18, blood pressure 124/75, and pulse ox 98% on room air. GENERAL:  Well-developed male.  He is awake, alert.  No distress. HEENT:  Normocephalic, atraumatic.  Mucous membranes are moist and wet. NECK:  Supple.  No carotid bruits heard.  Wounds healing very well. THYROID:  Soft.  No nodules or masses palpated. LUNGS:  Bilateral breath sounds.  Clear to auscultation. CARDIAC:  Normal S1, S2 sounds without murmurs, rubs, or gallops. ABDOMEN:  Soft, nondistended, nontender.  No masses palpated.  No hepatosplenomegaly, guarding, or rebound tenderness. EXTREMITIES:  Pedal pulses 1+ bilateral.  No cyanosis seen. NEUROLOGIC:  No drift in the upper or lower extremities.  Deep tendon reflexes 1+ throughout.  Downgoing toes bilaterally.  LABORATORY DATA:  Sodium 139, potassium 3.8, chloride 105, CO2 of 27, BUN 21, creatinine 1.15, and glucose 82.  Estimated GFR greater than 60, albumin 2.6, calcium 8.2, total protein 5.9, AST 15, and ALT 7.  White blood cell count 8.9, hemoglobin 12.0, hematocrit 35.5, and platelet count 125.  IMPRESSION/PLAN:  The patient was evaluated and determined to be stable for discharge home with close followup as arranged above.  I spent 43 minutes preparing this patient's discharge including reviewing his extensive hospitalization records, consultation notes, specialty notes, imaging studies, and laboratory data.  Also, reconciling medications preparing a  discharge summary.     Standley Dakins, MD     CJ/MEDQ  D:  06/25/2011  T:  06/25/2011  Job:  [id number]  cc:   Marjory Lies, M.D. Pramod P. Pearlean Brownie, MD V. Charlena Cross, MD Dr. Allyson Sabal  Electronically Signed by Standley Dakins  on 06/28/2011 08:43:07 PM

## 2011-07-05 ENCOUNTER — Inpatient Hospital Stay (HOSPITAL_COMMUNITY)
Admission: EM | Admit: 2011-07-05 | Discharge: 2011-07-07 | DRG: 065 | Disposition: A | Discharge status: Home / Self Care | Payer: Medicare Other | Attending: Internal Medicine | Admitting: Internal Medicine

## 2011-07-05 ENCOUNTER — Emergency Department (HOSPITAL_COMMUNITY): Payer: Medicare Other

## 2011-07-05 DIAGNOSIS — I2510 Atherosclerotic heart disease of native coronary artery without angina pectoris: Secondary | ICD-10-CM | POA: Diagnosis present

## 2011-07-05 DIAGNOSIS — Z8673 Personal history of transient ischemic attack (TIA), and cerebral infarction without residual deficits: Secondary | ICD-10-CM

## 2011-07-05 DIAGNOSIS — F17200 Nicotine dependence, unspecified, uncomplicated: Secondary | ICD-10-CM | POA: Diagnosis present

## 2011-07-05 DIAGNOSIS — I495 Sick sinus syndrome: Secondary | ICD-10-CM | POA: Diagnosis present

## 2011-07-05 DIAGNOSIS — G8190 Hemiplegia, unspecified affecting unspecified side: Secondary | ICD-10-CM | POA: Diagnosis present

## 2011-07-05 DIAGNOSIS — Z950 Presence of cardiac pacemaker: Secondary | ICD-10-CM

## 2011-07-05 DIAGNOSIS — I6350 Cerebral infarction due to unspecified occlusion or stenosis of unspecified cerebral artery: Principal | ICD-10-CM | POA: Diagnosis present

## 2011-07-05 DIAGNOSIS — I6529 Occlusion and stenosis of unspecified carotid artery: Secondary | ICD-10-CM | POA: Diagnosis present

## 2011-07-05 DIAGNOSIS — N179 Acute kidney failure, unspecified: Secondary | ICD-10-CM | POA: Diagnosis present

## 2011-07-05 DIAGNOSIS — I509 Heart failure, unspecified: Secondary | ICD-10-CM | POA: Diagnosis present

## 2011-07-05 DIAGNOSIS — Z951 Presence of aortocoronary bypass graft: Secondary | ICD-10-CM

## 2011-07-05 DIAGNOSIS — Z8249 Family history of ischemic heart disease and other diseases of the circulatory system: Secondary | ICD-10-CM

## 2011-07-05 DIAGNOSIS — I5022 Chronic systolic (congestive) heart failure: Secondary | ICD-10-CM | POA: Diagnosis present

## 2011-07-05 DIAGNOSIS — R209 Unspecified disturbances of skin sensation: Secondary | ICD-10-CM | POA: Diagnosis present

## 2011-07-05 HISTORY — DX: Transient cerebral ischemic attack, unspecified: G45.9

## 2011-07-05 LAB — COMPREHENSIVE METABOLIC PANEL
ALT: 9 U/L (ref 0–53)
Albumin: 3.1 g/dL — ABNORMAL LOW (ref 3.5–5.2)
Calcium: 9 mg/dL (ref 8.4–10.5)
GFR calc Af Amer: >60 mL/min (ref >60)
Glucose, Bld: 110 mg/dL — ABNORMAL HIGH (ref 70–99)
Sodium: 141 mEq/L (ref 135–145)
Total Protein: 6.3 g/dL (ref 6.0–8.3)

## 2011-07-05 LAB — CK TOTAL AND CKMB (NOT AT ARMC)
Relative Index: INVALID (ref 0.0–2.5)
Total CK: 57 U/L (ref 7–232)

## 2011-07-05 LAB — APTT: aPTT: 33 seconds (ref 24–37)

## 2011-07-05 LAB — PROTIME-INR
INR: 1.07 (ref 0.00–1.49)
Prothrombin Time: 14.1 seconds (ref 11.6–15.2)

## 2011-07-05 LAB — TROPONIN I: Troponin I: <0.3 ng/mL (ref <0.30)

## 2011-07-05 LAB — CBC
MCH: 32.1 pg (ref 26.0–34.0)
Platelets: 153 10*3/uL (ref 150–400)
RBC: 3.9 MIL/uL — ABNORMAL LOW (ref 4.22–5.81)
WBC: 9 10*3/uL (ref 4.0–10.5)

## 2011-07-05 LAB — DIFFERENTIAL
Eosinophils Absolute: 0.2 10*3/uL (ref 0.0–0.7)
Lymphs Abs: 4.2 10*3/uL — ABNORMAL HIGH (ref 0.7–4.0)
Monocytes Absolute: 0.4 10*3/uL (ref 0.1–1.0)
Monocytes Relative: 5 % (ref 3–12)
Neutrophils Relative %: 45 % (ref 43–77)

## 2011-07-05 NOTE — H&P (Signed)
NAMEHENRI, BAUMLER NO.:  [id number]  MEDICAL RECORD NO.:  [id number]  LOCATION:  [facility]                         FACILITY:  [facility]  PHYSICIAN:  Lonia Blood, M.D.       DATE OF BIRTH:  07-20-1927  DATE OF ADMISSION:  07/05/2011 DATE OF DISCHARGE:                             HISTORY & PHYSICAL   PRIMARY CARE PHYSICIAN:  Marjory Lies, MD  CHIEF COMPLAINT:  Right-sided weakness.  HISTORY OF PRESENT ILLNESS:  Mr. Preslar is an 77 year old gentleman, who was admitted from June 17, 2011, until June 25, 2011, after a left brain TIA/stroke.  He was found to have asymptomatic left internal carotid artery stenosis and he had a stent placed in the left internal carotid artery.  Post stenting, he developed complete right hemiplegia which actually resolved and by the time of discharge, the patient was discharged with strength 5/5 in all four extremities and able to ambulate and take care of himself alone at home.  He went home on aspirin and Plavix, but also started smoking right away.  This morning, he woke up with significant right-sided weakness and his family brought him to the hospital.  The patient was seen in consultation by Dr. Hoy Morn from Neurology, who deemed that he was out of the window for tissue plasminogen activator administration.  The patient was referred to the Hospitalist Service for admission.  His only complaint is the right- sided weakness.  He denies any chest pain or shortness of breath.  PAST MEDICAL HISTORY: 1. Coronary artery disease status post coronary artery bypass grafting     in February 2000. 2. Recurrent episodes of non-ST-elevation myocardial infarction post     coronary artery bypass grafting, last one being May 2012. 3. Chronic systolic congestive heart failure. 4. Sick sinus syndrome status post pacemaker placement. 5. Hypertension. 6. Hyperlipidemia. 7. Peripheral vascular disease status post right fem-pop bypass in     December  2009. 8. History of stroke.  SOCIAL HISTORY:  The patient lives with his wife in [place].  Smokes every day a few cigarettes.  Does not drink alcohol.  FAMILY HISTORY:  Positive for coronary artery disease and lung cancer.  REVIEW OF SYSTEMS:  Per HPI.  All other systems reviewed and negative.  PHYSICAL EXAM:  VITAL SIGNS:  Upon admission, temperature is 97.7, blood pressure 142/88, pulse 91, respirations 14, saturation 99% on room air. GENERAL:  The patient is alert and oriented, in no acute distress. HEENT:  Head, normocephalic, atraumatic.  Eyes, pupils are equal, round, reactive to light and accommodation.  Extraocular movements are intact. Throat clear. NECK:  Supple.  No JVD. CHEST:  Clear to auscultation without wheezes, rhonchi, or rales. HEART:  Regular rate and rhythm without murmurs, rubs, or gallops. ABDOMEN:  Soft, nontender, nondistended.  Bowel sounds are present. LOWER EXTREMITIES:  Without edema. NEUROLOGIC:  Cranial nerves II through XII intact.  Strength 2/5 right side and 5/5 left side.  Plantars are downgoing on the left and upgoing on the right.  He has got distal weakness in the right lower extremity with difficulty flexing the foot.  I have not ambulated the patient, but he got significant  right lower extremity weakness making it too hazardous currently.  LABORATORY DATA:  On admission, white blood cell count is 9000, hemoglobin 12.5, platelet count is 153.  INR is 1.  Protime is 14. Sodium is 141, potassium 4, chloride 107, bicarbonate 22, BUN 18, creatinine 1.3, glucose 110, alkaline phosphatase 68, total bilirubin 0.2, albumin 3.1.  CK 57, CK-MB 2.3.  Troponin-I less than 0.30.  IMAGING:  Emergent head CT at 3:00 p.m. shows atrophic and small-vessel ischemic changes.  No evidence of acute intracranial hemorrhage.  ASSESSMENT/PLAN:  An 77 year old gentleman, who is presenting now with right-sided weakness consistent with left brain stroke.  Since he  had recent left internal carotid artery stenosis, we will proceed with emergent carotid ultrasound to look at his patency of the stent. Otherwise, we will place the patient in the hospital on intravenous fluids.  I am going to stop his HCTZ, triamterene and his Norvasc, but continue aspirin, Plavix, lisinopril, metoprolol and Imdur.  I am also going to continue the rosuvastatin.  The patient will be seen by physical therapy, occupational therapy, and speech therapy.  I have personally informed the vascular surgeon, [name] of the patient's admission.     Lonia Blood, M.D.     SL/MEDQ  D:  07/05/2011  T:  07/05/2011  Job:  [id number]  cc:   Marjory Lies, M.D. Jorge Ny, MD  Electronically Signed by Lonia Blood M.D. on 07/05/2011 05:38:10 PM

## 2011-07-06 ENCOUNTER — Observation Stay (HOSPITAL_COMMUNITY): Payer: Medicare Other

## 2011-07-06 LAB — LIPID PANEL
Cholesterol: 101 mg/dL (ref 0–200)
LDL Cholesterol: 50 mg/dL (ref 0–99)
VLDL: 12 mg/dL (ref 0–40)

## 2011-07-06 NOTE — Consult Note (Signed)
[name], [name]                ACCOUNT NO.:  [id number]  MEDICAL RECORD NO.:  [id number]  LOCATION:  3027                         FACILITY:  [facility]  PHYSICIAN:  Levie Heritage, MD       DATE OF BIRTH:  [birth year]  DATE OF CONSULTATION:  07/05/2011 DATE OF DISCHARGE:                                CONSULTATION   REFERRING PHYSICIAN:  ER Team.  REASON FOR CONSULTATION:  Code stroke.  CHIEF COMPLAINT:  Sudden altered sensation on the right face, arm and leg.  HISTORY OF PRESENT ILLNESS:  This patient is an 77 year old African American man with multiple comorbidities who noted to have altered sensation suddenly in the right face, arm and leg.  This was noted at the best of the patient's judgment around 9:30 to 10 o'clock this morning.  He thought that he would sleep at off and actually woke up back again with the similar symptoms, so came to the hospital after the IV TPA window period.  Presently, he is having complaints with his gait instability likely because of lack of sensation on the right side. Denies any changes in his vision.  He is able to move all four extremities, but the right-sided face, arm and leg feel funny.  CT scan of the head was performed in the emergency department that did not reveal any acute intracranial abnormality.  PAST MEDICAL HISTORY:  He recently had stenting of the left internal carotid artery proximally, has peripheral vascular disease including both carotid arteries, multiple TIAs in the past pretty much in the similar distribution, coronary artery disease, hypertension, acute renal failure that is all eventually, hyperlipidemia, chronic active tobacco dependence, sick sinus syndrome, status post pacemaker.  He had similar right-sided numbness at least twice as a part of his CNS ischemic events, systolic heart failure with ejection fraction of 45% and thrombocytopenia.  CURRENT LIST OF MEDICATIONS: 1. He takes aspirin 81 mg daily with 75 mg  daily Plavix. 2. Amlodipine 5 mg daily. 3. Isosorbide 60 mg once daily. 4. Lisinopril 20 mg daily. 5. Metoprolol 50 mg twice a day. 6. Multivitamins. 7. Sublingual nitroglycerin. 8. Potassium chloride. 9. Rosuvastatin 20 mg half a tablet daily. 10.Tamsulosin 0.5 every morning. 11.Triamterene/hydrochlorothiazide 37.5/25 mg strength twice a weekly     on Tuesdays and Thursdays.  REVIEW OF CLINICAL DATA:  I have seen his stroke panel and have noted mild anemia with borderline platelets which is chronic for this patient and I have noted his CT scan of the head performed today that did not show any acute intracranial abnormality.  I have also reviewed his previous admission workup including the vascular studies, and eventually status post left internal carotid artery stenting.  ALLERGIES:  No known drug allergies.  SOCIAL HISTORY:  He is smoker pretty much his whole life.  Smokes almost a pack a day.  Denies use of alcohol or any illicit drugs.  FAMILY HISTORY:  Mother had heart problems and brother had lung cancer.  REVIEW OF SYSTEMS:  Denies any chest pain.  Denies any shortness of breath.  Denies any nausea, vomiting, diarrhea.  No issues with the bowel or bladder control problem.  No burning urination.  No recent weight loss.  No recent fevers.  No recent rashes.  No joint issues.  No changes in his vision.  He is able to move all four extremities, although has altered sensation on the right side.  Rest of 10-organ review of system unremarkable.  PHYSICAL EXAMINATION:  VITAL SIGNS:  Currently comfortably lying down on the bed with temperature 141/88 mmHg and pulse of 82 per minute. NEUROLOGIC:  Awake and oriented x3.  No acute distress.  NIH stroke scale currently is 2 for the right-sided face, arm and leg hemisensory loss.  Bilateral pupils are reactive to light and accommodation with no field cut noted on the limited bedside examination.  Moves eyes to all direction.  Face  is symmetrical for strength; however, does have obvious altered sensation on the right side as compared to the normal left side. Tongue is midline without atrophy or fasciculation.  Palate elevates symmetrically bilaterally.  Motor examination reveals he has intact strength all over without any drifts, but he has to focus visually on his limbs to move because he does not feel any sensation of there. Sensory examination reveals right face, arm and leg altered sensation and paresthesias.  Gait was deferred.  He has obvious dysmetria of the right arm and finger-to-nose testing, although does not have obvious dysmetria on the right leg heel-to-shin testing.  I do not see any dysarthria or aphasia.  IMPRESSION:  An 77 year old man with multiple comorbidities comes in with right face, arm and leg paresthesias and altered sensation of sudden onset that started almost 5 hours ago at the best of estimation. He is not a candidate for IV t-PA because he is out of the window period.   Additionally, I do not think he is any candidate for intervention at this time either since he recently had workup done and had stenting on the left internal carotid artery for the similar symptoms.  Another possibility is that his symptoms are result of reactivation of his previous stroke.  Based on the clinical physical examination, I cannot completely rule out the possibility of a small left thalamic stroke.  PLAN:  I have counseled the patient and complaining daughter in detail about my impression and have advised continuing aspirin and Plavix as he was already taking.  He will not get MRI given the presence of pacemaker.  Repeat CT can be performed on few days to see the demarcation of ischemic area; however, it is not going to change the medical management and he should stay on aspirin and Plavix. The main purpose of his admission would be to give the physical therapy, occupational therapy and the  rehabilitation in order to help him with the gait training.  No other recommendations at this time.  Stroke Team will follow up from the morning and will proceed as the case progresses.    ______________________________ Levie Heritage, MD     WS/MEDQ  D:  07/05/2011  T:  07/06/2011  Job:  [id number]  Electronically Signed by Levie Heritage MD on 07/06/2011 08:00:11 AM

## 2011-07-07 ENCOUNTER — Observation Stay (HOSPITAL_COMMUNITY): Payer: Medicare Other

## 2011-07-07 ENCOUNTER — Encounter (HOSPITAL_COMMUNITY): Payer: Self-pay | Admitting: Radiology

## 2011-07-07 DIAGNOSIS — I639 Cerebral infarction, unspecified: Secondary | ICD-10-CM

## 2011-07-07 HISTORY — DX: Cerebral infarction, unspecified: I63.9

## 2011-07-07 LAB — BASIC METABOLIC PANEL
Chloride: 110 mEq/L (ref 96–112)
GFR calc Af Amer: >60 mL/min (ref >60)
Potassium: 3.6 mEq/L (ref 3.5–5.1)

## 2011-07-07 MED ORDER — IOHEXOL 350 MG/ML SOLN: 50.0000 mL | Freq: Once | INTRAVENOUS | Status: DC | PRN

## 2011-07-14 NOTE — Discharge Summary (Signed)
NAMEHAGEN, BOHORQUEZ                ACCOUNT NO.:  [id number]  MEDICAL RECORD NO.:  [id number]  LOCATION:  3027                         FACILITY:  [facility]  PHYSICIAN:  Thad Ranger, MD       DATE OF BIRTH:  05-16-1927  DATE OF ADMISSION:  07/05/2011 DATE OF DISCHARGE:  07/07/2011                        DISCHARGE SUMMARY - REFERRING   PRIMARY CARE PHYSICIAN:  Marjory Lies, MD  CONSULTATIONS: 1. Neurology, Dr.  Delia Heady. 2. Neurointerventional Radiology, Dr. [name].  DISCHARGE DIAGNOSES: 1. Left brain infarct with hemisensory deficits.. 2. History of recent left internal carotid artery stent. 3. Hypertension. 4. Coronary artery disease. 5. History of chronic systolic congestive heart failure, currently     compensated. 6. Mild prerenal azotemia/acute kidney injury, resolved.  DISCHARGE MEDICATIONS: 1. Aspirin 325 mg p.o. daily. 2. Flomax 0.4 mg p.o. daily. 3. Amlodipine 5 mg p.o. daily. 4. Metoprolol 50 mg p.o. b.i.d. 5. Lisinopril 20 mg p.o. daily. 6. Nitroglycerin sublingual 0.4 mg q.5 minutes p.r.n. for chest pain. 7. Isosorbide mononitrate XR 60 mg p.o. daily. 8. Vitamin B6 1 tablet p.o. daily. 9. Potassium 20 mEq p.o. q.a.m. 10.Rosuvastatin 20 mg 1/2 tablet p.o. nightly. 11.triamterene/hydrochlorothiazide 37.5/25 mg 1 tablet p.o. twice     weekly. 12.Multivitamin 1 tablet daily. 13.Plavix 75 mg p.o. daily.  BRIEF HISTORY OF PRESENT ILLNESS AT THE TIME OF ADMISSION:  Mr. Wagster is an 77 year old male who was recently admitted from June 17, 2011, to June 25, 2011, after a left brain TIA/stroke.  The patient was found to have asymptomatic left internal carotid artery stenosis and had a stent placed in the left ICA.  Poststenting, the developed complete right hemiplegia which actually resolved and by the time of discharge he was discharged with the strength of 5/5 in all 4 extremities.  The patient was able to ambulate and take care of himself at home.  He went  home on aspirin and Plavix and also started smoking right away.  On the morning of admission, he woke up with significant right-sided weakness and his family brought him back to the hospital.  The patient was seen in consultation by Dr. Hoy Morn from Neurology who deemed that he was out of tPA window and the patient was referred to the Hospitalist Service for admission.  RADIOLOGICAL DATA:  CT of head without contrast on July 05, 2011, mild atrophy and small vessel ischemic changes.  No evidence of acute intracranial hemorrhage, mass lesion, or acute infarct.  CT of head and neck on July 07, 2011, showed poststenting left carotid bifurcation. No hemodynamically significant stenosis in the region of the stent and 80% diameter stenosis of left ICA immediately beyond the stent, forward at this level partially contributes to the superior 1.3-cm beyond this region, calcified plaque with mild less than 50% narrowing.  CT of head, intracranial atherosclerotic-type changes as detailed above, similar to the preoperative exam, small vessel disease-type changes without CT evidence of large acute infarct.  The patient already had a 2-D echo done on May 03, 2011, which had shown EF of 45-50%, mild hypokinesis of anterolateral and inferolateral myocardium with grade 1 diastolic dysfunction.  BRIEF HOSPITALIZATION COURSE:  Mr. Seats is an 77 year old  male who presented with right face, arm, and leg paresthesias and right-sided weakness consistent with left brain stroke.  The patient had a recent left internal carotid artery stenosis.  Status post stent. 1. Left brain CVA.  The patient was admitted to the Hospitalist Service on     the neurology floor.  He was not a candidate for IV tPA because he     is of the window.  The patient had extensive recent workup done     during the previous admission.  Neurology Service followed the     patient closely and also placed him on 325 mg of aspirin and Plavix      was continued.  CT angiogram of the head was repeated this     admission as well, which did not show any hemodynamically     significant stenosis in the region of the stent.  The patient was     cleared to be discharged from the Neurology Service at this point.     He had a PT/OT evaluation done during the hospitalization and was     cleared to be discharged home. 2. Hypertension, somewhat uncontrolled during the hospitalization: initially due     to permitted elevated BP in the view of acute stroke, the patient     will continue all of his antihypertensives. 3. Chronic systolic CHF.  It was compensated during the     hospitalization, did not need any diuresis. 4. Mild AKI with prerenal azotemia, completely resolved.  Triamterene     and hydrochlorothiazide were held temporarily during the     hospitalization.  The creatinine was 1.4 at the time of admission,     which has improved to 1.1.  The patient will be discharged home     today.  DISCHARGE FOLLOWUP:  With Dr. Pearlean Brownie from Neurology Service in 2 months and primary care physician, Dr. Marjory Lies within next 7-10 days for hospital followup.  DISCHARGE TIME:  35 minutes.  Thad Ranger, MD     RR/MEDQ  D:  07/07/2011  T:  07/07/2011  Job:  [id number]  cc:   Pramod P. Pearlean Brownie, MD Marjory Lies, M.D.  Electronically Signed by Andres Labrum RAI  on 07/14/2011 12:11:42 PM

## 2011-07-17 NOTE — H&P (Signed)
[name], [name]                ACCOUNT NO.:  [id number]  MEDICAL RECORD NO.:  [id number]  LOCATION:  3028                         FACILITY:  [facility]  PHYSICIAN:  Peggye Pitt, M.D. DATE OF BIRTH:  06-23-1927  DATE OF ADMISSION:  06/17/2011 DATE OF DISCHARGE:                             HISTORY & PHYSICAL   PRIMARY CARE PHYSICIAN:  Marjory Lies, MD  CHIEF COMPLAINTS:  Right face, arm and leg numbness.  HISTORY OF PRESENT ILLNESS:  [name] is a pleasant 76 year old African American gentleman with a history of coronary artery disease status post recent MI, history of sick sinus syndrome status post pacemaker implantation among other things, who presents to the hospital today with the above-mentioned complaints.  The patient states that his right face, arm and leg numbness began at about 10 o'clock this morning when he stood up from the couch.  He did not pay much attention to it, drank a glass of water and stayed at home.  However, at about 3:00 p.m. because the numbness was still present, he went ahead and called his PCP who told him that he should come into the hospital for evaluation.  The patient continued to wait around the house for another hour and then decided to call his heart doctor who again advised him to come into the hospital.  Finally his son came home at about 5 o'clock and brought him into the hospital for evaluation.  I am now evaluating [name] 12 hours after his symptoms have started, they are still present although somewhat improved.  He states that the right side of his lips especially lower lip as well as the inside of his right leg and second and third toes as well as particularly the first and second fingers of his right hand are still numb, as before it was the entire extremity.  He denies any chest pain, shortness of breath, blurred or double vision, nausea, vomiting or any other symptoms.  He is referred to [place] for admission for further  evaluation and management.  ALLERGIES:  He has no known drug allergies.  PAST MEDICAL HISTORY:  Significant for; 1. Coronary artery disease status post CABG x4 and recent non-ST     elevated MI in May 2012. 2. He also has combined chronic CHF. 3. History of sick sinus syndrome status post pacemaker implantation. 4. Hypertension. 5. Hyperlipidemia. 6. Tobacco abuse. 7. Peripheral vascular disease status post right fem-pop bypass     grafting in 2009. 8. He also has a prior history of CVA.  HOME MEDICATIONS: 1. Multivitamin 1 tablet daily. 2. Triamterene 37.5/hydrochlorothiazide 25 mg twice weekly on Tuesdays     and Thursdays. 3. Crestor 20 mg to take half a tablet at bedtime. 4. Potassium chloride 20 mEq daily. 5. Advil 200 mg twice daily as needed for pain. 6. Imdur 60 mg daily. 7. Amlodipine 5 mg daily. 8. Lisinopril 20 mg daily. 9. Metoprolol 50 mg twice daily. 10.Flomax 0.4 mg daily. 11.Vitamin B6 over-the-counter 1 tablet daily. 12.Nitroglycerin 0.4 mg sublingual every 5 minutes as needed for chest     pain up to 3 doses. 13.Aspirin 81 mg daily.  SOCIAL HISTORY:  Smokes about  a pack a day, has done so since age of 32. Denies any alcohol or illicit drug use.  FAMILY HISTORY:  Significant for heart disease in his mother, lung cancer in the brother.  REVIEW OF SYSTEMS:  Negative except as mentioned in the history present illness.  PHYSICAL EXAMINATION:  VITAL SIGNS:  On admission; blood pressure 131/88, heart rate 78, respirations 18, sats 99% on room air with temperature of 97.8. GENERAL:  He is alert, awake, and oriented x3.  His speech appears somewhat slurred in no acute distress. HEENT:  Normocephalic, atraumatic.  Pupils are equal and reactive to light and accommodation.  He has intact extraocular movements, he has a senile arc. NECK:  Supple.  No JVD, no lymphadenopathy, no bruits, no goiter. HEART:  Regular rate and rhythm without murmurs, rubs or  gallops. LUNGS:  Clear to auscultation bilaterally. ABDOMEN:  Soft, nontender, nondistended with positive bowel sounds. EXTREMITIES:  No clubbing, cyanosis or edema with positive pulses. NEUROLOGIC:  His mental status is intact.  His cranial nerves are intact with the exception of the numbness to the right side of the lower lip on the right corner of his mouth, he does not have a facial droop and ptosis.  His muscle strength is 5/5 bilateral and symmetric.  His DTRs are 2+ symmetric.  His sensation is intact to light touch on the left, it is somewhat numb over the dorsum of his hand and fingertips 1 and 2 on the right as well as to the inside aspect of his calf and toes #2 and #3 on the right as well.  His Babinski is downgoing bilaterally.  I have not ambulated him.  LABORATORY FINDINGS:  On admission; sodium 140, potassium 4.0, chloride 103, bicarb 30, BUN 18, creatinine 1.45, glucose of 95.  WBC is 8.9, hemoglobin is 13.8, platelets of 130.  Urinalysis that is negative.  A CT scan of the head that shows atrophy and chronic ischemic white matter disease without acute intracranial abnormality.  An EKG that shows an atrial paced rhythm.ASSESSMENT AND PLAN:  1  Right-sided numbness.  This is very suspicious for a TIA versus a CVA.  Unfortunately he has a pacer, so ordering an MRI is not possible at this time.  We may consider doing a CT scan in 48- 72 hours to see if indeed he has had an ischemic cerebral event.  He was taking aspirin before, so I will discontinue his aspirin and start him on Aggrenox to increase his secondary stroke prophylaxis.  We will get physical and occupational therapy to see him.  I will do a full stroke workup.  He had a recent echocardiogram in May 2012, so that will not be repeated at this time, however he will get carotid Dopplers as well as a fasting lipid profile.  He has been kept n.p.o. waiting for a swallow evaluation.  Plan to evolve with test  results. 1. Coronary artery disease.  We will continue his home medications.     He is currently stable without chest pain or shortness of breath.     3.  Hypertension.  His blood pressure is currently well controlled.     We will continue his home medications. 2. Hyperlipidemia.  We will [name] a fasting lipid profile and continue     his home dose of statin. 3. Tobacco abuse.  I will go ahead and place a nicotine patch while he     is here in the hospital.  I have counseled  him again smoking.  I     will also have a tobacco cessation counsellor see him as well. 4. For deep vein thrombosis prophylaxis, he will be on Lovenox.     Peggye Pitt, M.D.     EH/MEDQ  D:  06/17/2011  T:  06/17/2011  Job:  [id number]  Electronically Signed by Peggye Pitt M.D. on 07/17/2011 05:26:46 PM

## 2011-08-02 ENCOUNTER — Encounter: Payer: Self-pay | Admitting: Vascular Surgery

## 2011-08-02 ENCOUNTER — Ambulatory Visit (INDEPENDENT_AMBULATORY_CARE_PROVIDER_SITE_OTHER): Payer: Medicare Other | Admitting: Surgery

## 2011-08-02 ENCOUNTER — Encounter: Payer: Self-pay | Admitting: Surgery

## 2011-08-02 VITALS — BP 168/67 | HR 89 | Temp 97.5°F

## 2011-08-02 DIAGNOSIS — I6529 Occlusion and stenosis of unspecified carotid artery: Secondary | ICD-10-CM

## 2011-08-02 NOTE — Progress Notes (Signed)
Subjective:     Patient ID: [name], male   DOB: 1927-11-13, 77 y.o.   MRN: [id number]  HPI The patient is a 77 year old who was seen in the hospital for carotid stenosis. He presented with right arm symptoms and slurred speech. He was found to have a high-grade left carotid stenosis. He was evaluated by 2 members of the vascular team a cardiologist the stroke service and the medicine service and felt to be a candidate for carotid intervention. Due to his coronary risk factors, having recently had a myocardial infarction he was scheduled for a left carotid stent. This was performed on July 18. The patient had a neurologic deficits following his procedure which consisted of pressured speech and right arm weakness. A CT scan was obtained which was negative for acute stroke. The patient recovered his deficits and was discharged home. It was felt that he had a TIA. He we presented the following week with recurrent symptoms. It was felt that his stroke was a posterior circulation stroke and he was treated medically. He is back today for followup. He remains independent. 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  Review of Systems    no chest pain no shortness of breath otherwise negative Past Medical History  Diagnosis Date  . Hypertension   . CVA (cerebral vascular accident)   . Pacemaker   . CAD (coronary artery disease)   . Hx of CABG   . MI (myocardial infarction)   . CAD (coronary artery disease)   . TIA (transient ischemic attack)   . Hyperlipidemia     History  Substance Use Topics  . Smoking status: Former Smoker    Types: Cigarettes  . Smokeless tobacco: Former Neurosurgeon    Quit date: 06/02/2011  . Alcohol Use: No    Family History  Problem Relation Age of Onset  . Cancer Brother 60    oral cawncer  . Cancer Brother     prostate ca    Allergies  Allergen  Reactions  . Pravachol     myalgias  . Simvastatin     myalgia    Current outpatient prescriptions:amLODipine (NORVASC) 5 MG tablet, Take 5 mg by mouth daily.  , Disp: , Rfl: ;  aspirin 81 MG tablet, Take 81 mg by mouth daily.  , Disp: , Rfl: ;  clopidogrel (PLAVIX) 75 MG tablet, Take 75 mg by mouth daily.  , Disp: , Rfl: ;  hydrochlorothiazide 25 MG tablet, Take 25 mg by mouth daily.  , Disp: , Rfl: ;  ibuprofen (ADVIL,MOTRIN) 600 MG tablet, Take 600 mg by mouth every 6 (six) hours as needed.  , Disp: , Rfl:  isosorbide mononitrate (IMDUR) 60 MG 24 hr tablet, Take 60 mg by mouth daily.  , Disp: , Rfl: ;  lisinopril (PRINIVIL,ZESTRIL) 20 MG tablet, Take 20 mg by mouth daily.  , Disp: , Rfl: ;  metoprolol (TOPROL-XL) 50 MG 24 hr tablet, Take 50 mg by mouth daily.  , Disp: , Rfl: ;  nitroGLYCERIN (NITROSTAT) 0.4 MG SL tablet, Place 0.4 mg under the tongue every 5 (five) minutes as needed.  , Disp: , Rfl:  potassium chloride SA (K-DUR,KLOR-CON) 20 MEQ tablet, Take 20 mEq by mouth 2 (two) times daily.  , Disp: , Rfl: ;  rosuvastatin (CRESTOR) 20 MG tablet, Take 20 mg by mouth daily. 1/2 tab daily ,  Disp: , Rfl: ;  Tamsulosin HCl (FLOMAX) 0.4 MG CAPS, Take 0.4 mg by mouth daily after supper.  , Disp: , Rfl: ;  dicyclomine (BENTYL) 20 MG tablet, Take 20 mg by mouth every 6 (six) hours.  , Disp: , Rfl:   There were no vitals filed for this visit.  There is no height or weight on file to calculate BMI.   BP 168/67  Pulse 89  Temp(Src) 97.5 F (36.4 C) (Oral)    Objective:   Physical Exam  Constitutional: He appears well-developed and well-nourished.  HENT:  Head: Normocephalic and atraumatic.  Neck: Normal range of motion. Neck supple.  Cardiovascular: Normal rate and regular rhythm.   Abdominal: Soft.  Musculoskeletal: Normal range of motion.  Neurological:       Pressured speech but able to carry on a full conversation mild right arm weakness and numbness       Assessment:    status  post left carotid stent    Plan:     The patient was supposed to get a carotid duplex today but that did not happen. This will be rescheduled for next week. Overall he is recovering from his stroke. We will continue with his antiplatelet regimen. He'll followup in 6 months

## 2011-08-05 NOTE — Op Note (Signed)
Louis, [name]                ACCOUNT NO.:  [id number]  MEDICAL RECORD NO.:  [id number]  LOCATION:  3031                         FACILITY:  [facility]  PHYSICIAN:  Juleen China IV, MDDATE OF BIRTH:  1927-05-23  DATE OF PROCEDURE:  06/23/2011 DATE OF DISCHARGE:  06/25/2011                              OPERATIVE REPORT   PREOPERATIVE DIAGNOSIS:  Symptomatic left carotid stenosis.  POSTOPERATIVE DIAGNOSIS:  Symptomatic left carotid stenosis.  PROCEDURES PERFORMED: 1. Ultrasound access, left femoral artery. 2. Left femoral angiogram. 3. Aortic arch angiogram. 4. Second-order catheterization (right carotid artery). 5. Right carotid angiogram. 6. Left carotid stent with distal embolic protection.  DEVICES USED:  Stent was a Abbott 9 x 7, a large NAV6 filter was placed.  SURGEON: 1. Durene Cal IV, MD  COMPLICATIONS:  The patient had neurologic deficit after filter deployment.  Code stroke was called.  The stent was deployed with residual stenosis less than 10%.  INDICATIONS:  [name] is an 77 year old gentleman with symptomatic left carotid stenosis.  He presented to the hospital with neurologic defects. He recently suffered a MI about 2 months ago.  He is deemed to be extremely high risk from a cardiac perspective to undergo general anesthesia; and therefore, we elected to evaluate him for carotid stent. He comes in today for this procedure.  The risks and benefits were discussed with the patient preoperatively.  PROCEDURE:  The patient was identify in the holding area, taken to room #8, and placed supine on the table.  The left groin was prepped and draped in the usual fashion.  A time-out was called.  The left femoral arteries were evaluated with ultrasound and found to be patent but calcified.  A digital ultrasound image was required.  The left femoral artery was then accessed under ultrasound guidance with a micropuncture needle.  An 0.018 wire was then advanced,  but met resistance in the mid iliac system.  Micropuncture sheath was placed followed by a Bentson wire.  I then removed the micropuncture sheath and placed a 5-French sheath.  Through this, a retrograde angiogram was performed which showed an extremely tortuous and diseased left iliac system.  I was able to use a Kumpe catheter and a Bentson wire to get through the tortuosity within the iliac vessel.  I then advanced a pigtail into the ascending aorta and an aortic arch angiogram was performed.  Next using a Berenstein II catheter, the right carotid artery was selected and a right carotid angiogram was performed.  This was followed by images of the left carotid system with intracranial interpretation.  FINDINGS:  Aortic arch:  A type 1 aortic arch is visualized with a fair amount of calcification.  The innominate artery is widely patent.  The right subclavian is widely patent.  Proximal right carotid artery is patent.  The left common carotid artery is patent throughout its course. The left subclavian is patent without stenosis.  The left vertebral artery originates from the left subclavian and appears to be dominant.  Right carotid angiogram:  The right common carotid artery is widely patent.  The right external carotid artery is widely patent.  Minimal stenosis is identified within  the right internal carotid artery.  Left carotid angiogram:  The left common carotid artery is patent throughout its course.  The left external carotid artery is occluded. There is an area of ulceration at the low bifurcation of the common carotid artery where the external carotid was occluded.  There is also an area of stenosis in the internal carotid approximately 80%.  At this point in time, the decision was made to proceed with carotid stenting.  Despite the calcification and the arch configuration, it was felt this would be a successful venture and with his surgical risk from a coronary perspective,  this was deemed to be a best option since he was symptomatic.  At this point over a wire, a 6-French 90-cm sheath was placed.  I tried to cannulate the left carotid with a JV1; however, we could not get any support to get the sheath out.  Therefore, I elected to cannulate the left carotid artery with a Vtech catheter.  With this catheter in position, an Amplatz superstiff wire was able to be placed and we were able to get the sheath into the left common carotid artery. Due to the configuration of the Vtech catheter, it was removed and JV1 catheter was placed and over the Amplatz wire and then we were able to advance the sheath well into the common carotid artery.  At this point, Angiomax was administered.  An ACT confirmed that he was adequately anticoagulated.  Next, a large NAV6 filter was prepared on the back table and then loaded through the sheath.  It was easily inserted into the distal internal carotid artery and deployed.  It was at this point in time that the patient was having difficulty squeezing his right hand and he was having some expressive aphasia.  We felt that he most likely had a neurologic event either from the filter placement or from manipulation of the catheters within the arch.  Since the filter was arty deployed, we made a clinical judgment to go ahead and place the stent as it would take about as long to place the stent that it would be to remove everything.  A 3 x 2 Sterling balloon was advanced across the stenosis and used to pre-dilate this area.  There was no hemodynamic change with predilation.  Next, a 9 x 7 Abbott stent was deployed across the lesion.  It was molded to confirmation with a 5-mm balloon.  A completion study was then performed which demonstrated the stent to be in good position with resolution of the stenosis.  There was no debris within the filter.  We then proceeded with filter removal and intracranial images.  There do not appear to be any  major changes in the intracranial vasculature; however, this will be formally evaluated by Neuro.  Once the filter was removed, the sheath was exchanged out over a wire for short 6-French sheath and a code stroke was called.  The patient will be taken to the holding area for further evaluation.  IMPRESSION: 1. Left carotid stent placement with distal embolic protection using     an Abbott 9 x 7 stent and a large NAV6 filter.  The patient did     have a neurologic deficit after the procedure and was evaluated by     Neurology. 2. No significant right carotid stenosis.    Jorge Ny, MD    VWB/MEDQ  D:  06/27/2011  T:  06/28/2011  Job:  [id number]  cc:   Christiane Ha  Allyson Sabal, M.D.  Electronically Signed by Arelia Longest IV MD on 08/04/2011 11:58:32 PM

## 2011-08-10 NOTE — Progress Notes (Signed)
[name], [name]                ACCOUNT NO.:  [id number]  MEDICAL RECORD NO.:  [id number]  LOCATION:  3018                         FACILITY:  [facility]  PHYSICIAN:  Calvert Cantor, M.D.     DATE OF BIRTH:  09-Jun-1927                                PROGRESS NOTE   PRIMARY CARE PHYSICIAN: Marjory Lies, MD.  PRESENTING COMPLAINT: Numbness of right face, arm, leg, and feet.  CURRENT DIAGNOSES: 1. Transient ischemic attack versus cerebrovascular accident.  MRI     unobtainable due to pacemaker status. 2. Left-sided carotid stenosis.  The patient is going to be undergoing     stenting tomorrow. 3. Hypertension uncontrolled.  Medications adjusted during hospital     stay. 4. Acute renal failure on admission now improved. 5. Hyperlipidemia on statin at home. 6. Coronary artery disease, medically managed. 7. Tobacco abuse. 8. Right thigh pain during hospital stay improved with NSAIDs. 9. Sick sinus syndrome status post pacemaker. 10.Peripheral vascular disease status post right fem-pop bypass graft     in 2009. 11.History of prior cerebrovascular accident with residual right-sided     numbness. 12.Systolic heart failure, chronic with ejection fraction of 45-50%.     End diastolic heart failure with grade I diastolic dysfunction. 13.Mild thrombocytopenia.  CURRENT MEDICATIONS: 1. Norvasc 5 mg daily. 2. Plavix 75 mg daily. 3. Imdur 60 mg daily. 4. Lisinopril 20 mg daily. 5. Lopressor 75 mg b.i.d.  This has been increased from his home dose. 6. Naprosyn 25 mg b.i.d., this will be stopped today. 7. K-Dur 20 mEq daily. 8. Vitamin B6 50 mg daily. 9. Crestor 10 mg daily. 10.Flomax 0.4 mg daily. 11.Maxzide 1 tablet daily.  This has been increased from his home     dose.  He was only taking it a few times a week.  This has been     changed to daily. 12.Lovenox 40 mg daily.  Others are p.r.n. medications.  BRIEF HOSPITAL COURSE: This is an 77 year old male who had a previous CVA  and had some residual numbness of the right fingers and hands.  He subsequently developed more extensive numbness which he describes as part of the right side of his face, more of the right arm, right fingers, right leg, and right foot. He initially ignored it and came into the hospital at least 8 hours later after being advised by his primary care physician whom he had contacted.  He was not a TPA candidate due to delayed presentation.  The patient does have a pacemaker and therefore an MRI could not be obtained.  Initial CT of the head was negative for CVA.  Further workup included a echo and carotid Dopplers.  Carotid Dopplers noted that the patient had a significant amount of stenosis on the left.  This was measured as 60-79%.  Right was noted to be within normal limits.  The patient subsequently had a CTA of the head and neck which revealed 85% stenosis at the distal ICA bulb on the left.  Right ICA was noted to have a 50% stenosis at the distal bulb and a 50% stenosis just beneath the skull base.  There was a 50% stenosis also noted  in the left common carotid at the origin.  Vascular surgery consult was requested.  It was recommended that he have either a carotid endarterectomy or carotid stenting done.  Due to the fact that he had recently had an MI in June, a cardiology consult was requested.  Based on the cardiac cath that he had recently, it was decided that stenting would be more appropriate for him.  At this point, it is planned for him to undergo carotid stenting tomorrow.  Dr. Allyson Sabal will be performing the procedure.  In regards to further stroke workup, his 2-D echo which was done on May 03, 2011 revealed an EF of 45-50% and grade I diastolic dysfunction.  I will be repeating a plain CT head today to see if in fact was a CVA versus a TIA.  I am uncertain if this will eventually change his management as he most likely will be discharged home on Plavix rather than the  aspirin he was previously taking.  PHYSICAL EXAMINATION: GENERAL:  Today he is awake, alert, oriented x3. NEUROLOGIC:  Cranial nerves:  II through XII are intact.  He still has residual numbness in the tips of his first and second finger and in the medial aspect of his right forearm.  Strength is 5/5 in all 4 extremities.  He has some numbness on the right half of his lower lip. HEART:  Regular rate and rhythm.  No murmurs, rubs, or gallops noted. NECK:  The patient does have a left carotid bruit. ABDOMEN:  Soft, nontender, nondistended.  Bowel sounds positive. EXTREMITIES:  No cyanosis, clubbing, or edema.  He has positive pedal pulses.  The patient has been advised regarding his tobacco abuse.  Of note, blood pressure was elevated during hospital stay and above medication adjustments have been made.  He was also noted to have mild acute renal failure with a creatinine of 1.50 on admission.  This has improved.  Creatinine checked this morning was 1.33.  He also is noted to have mild thrombocytopenia with platelets of 130,000 and 139,000.  Time on patient care today was 45 minutes.     Calvert Cantor, M.D.     SR/MEDQ  D:  06/22/2011  T:  06/22/2011  Job:  [id number]  Electronically Signed by Calvert Cantor M.D. on 08/10/2011 02:43:23 PM

## 2011-08-20 ENCOUNTER — Other Ambulatory Visit: Payer: Self-pay

## 2011-08-20 DIAGNOSIS — Z48812 Encounter for surgical aftercare following surgery on the circulatory system: Secondary | ICD-10-CM

## 2011-08-20 DIAGNOSIS — I70219 Atherosclerosis of native arteries of extremities with intermittent claudication, unspecified extremity: Secondary | ICD-10-CM

## 2011-08-20 DIAGNOSIS — I6529 Occlusion and stenosis of unspecified carotid artery: Secondary | ICD-10-CM

## 2011-08-23 ENCOUNTER — Other Ambulatory Visit: Payer: Medicare Other

## 2011-09-09 LAB — URINALYSIS, ROUTINE W REFLEX MICROSCOPIC
Hgb urine dipstick: NEGATIVE
Nitrite: NEGATIVE
Specific Gravity, Urine: 1.023 (ref 1.005–1.030)
Urobilinogen, UA: 1 mg/dL (ref 0.0–1.0)

## 2011-09-09 LAB — COMPREHENSIVE METABOLIC PANEL
Alkaline Phosphatase: 71 U/L (ref 39–117)
BUN: 11 mg/dL (ref 6–23)
Chloride: 101 mEq/L (ref 96–112)
Glucose, Bld: 82 mg/dL (ref 70–99)
Potassium: 4.1 mEq/L (ref 3.5–5.1)
Total Bilirubin: 1 mg/dL (ref 0.3–1.2)

## 2011-09-09 LAB — BASIC METABOLIC PANEL
CO2: 27 mEq/L (ref 19–32)
Chloride: 105 mEq/L (ref 96–112)
GFR calc Af Amer: >60 mL/min (ref >60)
Glucose, Bld: 138 mg/dL — ABNORMAL HIGH (ref 70–99)
Potassium: 3.3 mEq/L — ABNORMAL LOW (ref 3.5–5.1)
Sodium: 138 mEq/L (ref 135–145)

## 2011-09-09 LAB — CBC
HCT: 39.4 % (ref 39.0–52.0)
HCT: 48.1 % (ref 39.0–52.0)
Hemoglobin: 13 g/dL (ref 13.0–17.0)
Hemoglobin: 15.8 g/dL (ref 13.0–17.0)
MCHC: 33 g/dL (ref 30.0–36.0)
RBC: 4.12 MIL/uL — ABNORMAL LOW (ref 4.22–5.81)
RDW: 14.9 % (ref 11.5–15.5)
WBC: 9.9 10*3/uL (ref 4.0–10.5)

## 2011-09-09 LAB — PROTIME-INR: INR: 1 (ref 0.00–1.49)

## 2011-09-09 LAB — APTT: aPTT: 32 seconds (ref 24–37)

## 2011-09-09 LAB — TYPE AND SCREEN
ABO/RH(D): O POS
Antibody Screen: NEGATIVE

## 2011-09-17 LAB — URINALYSIS, ROUTINE W REFLEX MICROSCOPIC
Bilirubin Urine: NEGATIVE
Glucose, UA: NEGATIVE
Hgb urine dipstick: NEGATIVE
Protein, ur: NEGATIVE
Urobilinogen, UA: 1

## 2011-09-17 LAB — CBC
HCT: 43.1
MCHC: 33.7
MCV: 95.2
Platelets: 219
RBC: 4.53

## 2011-09-17 LAB — I-STAT 8, (EC8 V) (CONVERTED LAB)
Acid-Base Excess: 2
Chloride: 102
Glucose, Bld: 96
Potassium: 3.4 — ABNORMAL LOW
pCO2, Ven: 53 — ABNORMAL HIGH
pH, Ven: 7.351 — ABNORMAL HIGH

## 2011-09-17 LAB — DIFFERENTIAL
Basophils Relative: 1
Eosinophils Absolute: 0.1
Eosinophils Relative: 2
Lymphs Abs: 3.2
Monocytes Relative: 6
Neutrophils Relative %: 49

## 2011-09-17 LAB — POCT I-STAT CREATININE: Creatinine, Ser: 1.2

## 2012-01-06 DIAGNOSIS — H02009 Unspecified entropion of unspecified eye, unspecified eyelid: Secondary | ICD-10-CM | POA: Insufficient documentation

## 2012-01-06 DIAGNOSIS — H02409 Unspecified ptosis of unspecified eyelid: Secondary | ICD-10-CM | POA: Insufficient documentation

## 2012-02-04 ENCOUNTER — Encounter: Payer: Self-pay | Admitting: Vascular Surgery

## 2012-02-04 ENCOUNTER — Encounter: Payer: Self-pay | Admitting: Surgery

## 2012-02-07 ENCOUNTER — Encounter (INDEPENDENT_AMBULATORY_CARE_PROVIDER_SITE_OTHER): Payer: Medicare Other | Admitting: *Deleted

## 2012-02-07 ENCOUNTER — Ambulatory Visit (INDEPENDENT_AMBULATORY_CARE_PROVIDER_SITE_OTHER): Payer: Medicare Other | Admitting: Surgery

## 2012-02-07 ENCOUNTER — Other Ambulatory Visit (INDEPENDENT_AMBULATORY_CARE_PROVIDER_SITE_OTHER): Payer: Medicare Other | Admitting: *Deleted

## 2012-02-07 ENCOUNTER — Encounter: Payer: Self-pay | Admitting: Surgery

## 2012-02-07 VITALS — BP 167/80 | HR 72 | Resp 16 | Ht 69.0 in | Wt 143.0 lb

## 2012-02-07 DIAGNOSIS — I70509 Unspecified atherosclerosis of nonautologous biological bypass graft(s) of the extremities, unspecified extremity: Secondary | ICD-10-CM

## 2012-02-07 DIAGNOSIS — Z48812 Encounter for surgical aftercare following surgery on the circulatory system: Secondary | ICD-10-CM

## 2012-02-07 DIAGNOSIS — I6529 Occlusion and stenosis of unspecified carotid artery: Secondary | ICD-10-CM

## 2012-02-07 NOTE — Progress Notes (Signed)
[facility] [place]   Patient name: [name] MRN: [id number] DOB: 08/10/1927 Sex: male     Chief Complaint  Patient presents with  . Carotid    6 month f/up - w/Labs    HISTORY OF PRESENT ILLNESS: The patient comes in today for followup. He has a history of a right femoral to above-knee popliteal artery bypass graft by Dr. early in 2009. The patient does complain of right calf claudication. The patient also has a history of undergoing left carotid stenting in the setting of a symptomatic stenosis. His stent was placed on July 12. He was stented because he had a heart attack 2 months prior. Postoperatively he developed a fascial. He underwent full stroke workup. Within the course of the next 24 hours his deficits resolved he was discharged to home. He did re\re presented to the hospital with similar symptoms to 3 weeks later. It was felt that his symptoms were related to intracranial disease. He's back today for followup. He does not endorse any new neurologic events. He feels like he is back to his baseline.  Past Medical History  Diagnosis Date  . Hypertension   . CVA (cerebral vascular accident)   . Pacemaker   . CAD (coronary artery disease)   . Hx of CABG   . MI (myocardial infarction)   . CAD (coronary artery disease)   . TIA (transient ischemic attack)   . Hyperlipidemia     Past Surgical History  Procedure Date  . Coronary artery bypass graft   . Femoral to akpopliteal bypass graft   . Carotid stent 06/23/2011    left    History   Social History  . Marital Status: Married    Spouse Name: N/A    Number of Children: N/A  . Years of Education: N/A   Occupational History  . Not on file.   Social History Main Topics  . Smoking status: Former Smoker    Types: Cigarettes  . Smokeless tobacco: Former User    Quit date: 06/02/2011  . Alcohol Use: No  . Drug Use: No  . Sexually Active: Not on file   Other Topics Concern  . Not on file    Social History Narrative  . No narrative on file    Family History  Problem Relation Age of Onset  . Cancer Brother 60    oral cawncer  . Cancer Brother     prostate ca    Allergies as of 02/07/2012 - Review Complete 02/07/2012  Allergen Reaction Noted  . Pravachol  08/02/2011  . Simvastatin  08/02/2011    Current Outpatient Prescriptions on File Prior to Visit  Medication Sig Dispense Refill  . amLODipine (NORVASC) 5 MG tablet Take 5 mg by mouth daily.        . aspirin 81 MG tablet Take 81 mg by mouth daily.        . clopidogrel (PLAVIX) 75 MG tablet Take 75 mg by mouth daily.        . dicyclomine (BENTYL) 20 MG tablet Take 20 mg by mouth every 6 (six) hours.        . hydrochlorothiazide 25 MG tablet Take 25 mg by mouth daily.        . ibuprofen (ADVIL,MOTRIN) 600 MG tablet Take 600 mg by mouth every 6 (six) hours as needed.        . isosorbide mononitrate (IMDUR) 60 MG 24 hr tablet Take 60 mg by mouth daily.        .   lisinopril (PRINIVIL,ZESTRIL) 20 MG tablet Take 20 mg by mouth daily.        . metoprolol (TOPROL-XL) 50 MG 24 hr tablet Take 50 mg by mouth daily.        . nitroGLYCERIN (NITROSTAT) 0.4 MG SL tablet Place 0.4 mg under the tongue every 5 (five) minutes as needed.        . potassium chloride SA (K-DUR,KLOR-CON) 20 MEQ tablet Take 20 mEq by mouth 2 (two) times daily.        . rosuvastatin (CRESTOR) 20 MG tablet Take 20 mg by mouth daily. 1/2 tab daily       . Tamsulosin HCl (FLOMAX) 0.4 MG CAPS Take 0.4 mg by mouth daily after supper.           REVIEW OF SYSTEMS: No changes  PHYSICAL EXAMINATION:   Vital signs are BP 167/80  Pulse 72  Resp 16  Ht 5' 9" (1.753 m)  Wt 143 lb (64.864 kg)  BMI 21.12 kg/m2  SpO2 100% General: The patient appears their stated age. HEENT:  No gross abnormalities Pulmonary:  Non labored breathing Abdomen: Soft and non-tender Musculoskeletal: There are no major deformities. Neurologic: No focal weakness or paresthesias are  detected, Skin: There are no ulcer or rashes noted. Psychiatric: The patient has normal affect. Cardiovascular: Pedal pulses are nonpalpable bilaterally. He has significant edema in his legs.   Diagnostic Studies Duplex ultrasound was performed today this shows an occluded right femoral popliteal bypass graft with an ankle-brachial index of 0.66 on the right and 0.71 was  Carotid ultrasound shows 80-99% stenosis within the previously deployed stent. There is also significant plaque in the proximal common carotid artery on the right with no significant internal carotid stenosis.  Assessment: #1 claudication #2  carotid stenosis Plan: The patient will be scheduled for angiogram. I will plan on accessing his left groin. I will study his abdominal aorta and bilateral lower extremity with emphasis on the right leg to see what his options are for revascularization. In addition I will plan on studying both carotid arteries particularly paying attention to the stent placed in his left carotid artery. By duplex ultrasound there is greater than 80% stenosis. I want to confirm this finding and then plan for possible restenting at a later time this is been scheduled for Tuesday, March 19  V. Wells Makaia Rappa IV, M.D. [facility] [place] Office: [phone] Pager:  [phone]   

## 2012-02-08 ENCOUNTER — Other Ambulatory Visit: Payer: Self-pay | Admitting: *Deleted

## 2012-02-14 NOTE — Procedures (Unsigned)
CAROTID DUPLEX EXAM  INDICATION:  Follow up carotid artery disease  HISTORY: Diabetes:  No Cardiac:  MI Hypertension:  Yes Smoking:  Yes Previous Surgery:  Left carotid stent 06/23/2011 by [name] CV History:  History of CVA Amaurosis Fugax No, Paresthesias Yes No, Hemiparesis Yes No                                      RIGHT             LEFT Brachial systolic pressure:         190               183 Brachial Doppler waveforms: Vertebral direction of flow:        Antegrade         Antegrade DUPLEX VELOCITIES (cm/sec) CCA peak systolic                   94                56 proximal ECA peak systolic                   83                retrograde ICA peak systolic                   82 mid            359 (distal to stent) ICA end diastolic                   19                110 PLAQUE MORPHOLOGY:                  calcific          mixed PLAQUE AMOUNT:                      moderate          moderate to severe (distal to stent) PLAQUE LOCATION:                    CCA, ICA          CCA, distal to stent  IMPRESSION: 1. Right:  Significant plaque in the proximal common carotid artery,     not flow reducing. 2. 1%-39% right internal carotid artery stenosis. 3. Left:  Significant plaque in the proximal common carotid artery,     not flow reducing. 4. The left external carotid artery appears retrograde and appears to     be fed by a branch. 5. The left stent appears patent, however, narrowing/stenosis     visualized distal to stent with velocity of 359/110 cm, in the 60%-     79% range upper and of range. 6. Vertebral direction of flow antegrade bilaterally.  ___________________________________________ V. Charlena Cross, MD  SS/MEDQ  D:  02/07/2012  T:  02/07/2012  Job:  [id number]

## 2012-02-14 NOTE — Procedures (Unsigned)
LOWER EXTREMITY ARTERIAL EVALUATION-SINGLE LEVEL  INDICATION:  Follow up right lower extremity edema.  HISTORY: Complains of right ankle edema. Diabetes:  No. Cardiac:  MI. Hypertension:  Yes. Smoking:  Yes. Previous Surgery:  Right femoral-to-popliteal bypass graft 11/19/2008 with [employer].  RESTING SYSTOLIC PRESSURES: (ABI)                                    RIGHT           LEFT Brachial: Anterior tibial: Posterior tibial: Peroneal: DOPPLER WAVEFORM ANALYSIS: Anterior tibial: Posterior tibial: Peroneal: ABI: Date: 02/07/2012                0.66            0.71 PREVIOUS ABI'S:  Date:  12/02/2010                   1.04           0.69    DUPLEX:  No color flow or Doppler signal detected throughout the right femoral-to-popliteal bypass graft.  Flow appears to be fed by the profunda artery with collaterals visualized in the distal femoral artery.  IMPRESSION:  Occluded right fem-pop bypass graft.  ___________________________________________ V. Charlena Cross, MD  SS/MEDQ  D:  02/07/2012  T:  02/07/2012  Job:  [id number]

## 2012-02-17 ENCOUNTER — Encounter (HOSPITAL_COMMUNITY): Payer: Self-pay | Admitting: Respiratory Therapy

## 2012-02-22 ENCOUNTER — Encounter (HOSPITAL_COMMUNITY)
Admission: RE | Disposition: A | Discharge status: Home / Self Care | Payer: Self-pay | Source: Ambulatory Visit | Attending: Surgery

## 2012-02-22 ENCOUNTER — Other Ambulatory Visit: Payer: Self-pay

## 2012-02-22 ENCOUNTER — Ambulatory Visit (HOSPITAL_COMMUNITY)
Admission: RE | Admit: 2012-02-22 | Discharge: 2012-02-22 | Disposition: A | Discharge status: Home / Self Care | Payer: Medicare Other | Source: Ambulatory Visit | Attending: Surgery | Admitting: Surgery

## 2012-02-22 DIAGNOSIS — I658 Occlusion and stenosis of other precerebral arteries: Secondary | ICD-10-CM | POA: Insufficient documentation

## 2012-02-22 DIAGNOSIS — I252 Old myocardial infarction: Secondary | ICD-10-CM | POA: Insufficient documentation

## 2012-02-22 DIAGNOSIS — I714 Abdominal aortic aneurysm, without rupture, unspecified: Secondary | ICD-10-CM | POA: Insufficient documentation

## 2012-02-22 DIAGNOSIS — I70219 Atherosclerosis of native arteries of extremities with intermittent claudication, unspecified extremity: Secondary | ICD-10-CM | POA: Insufficient documentation

## 2012-02-22 DIAGNOSIS — I6509 Occlusion and stenosis of unspecified vertebral artery: Secondary | ICD-10-CM | POA: Insufficient documentation

## 2012-02-22 DIAGNOSIS — Z8673 Personal history of transient ischemic attack (TIA), and cerebral infarction without residual deficits: Secondary | ICD-10-CM | POA: Insufficient documentation

## 2012-02-22 DIAGNOSIS — I6529 Occlusion and stenosis of unspecified carotid artery: Secondary | ICD-10-CM | POA: Insufficient documentation

## 2012-02-22 DIAGNOSIS — T82898A Other specified complication of vascular prosthetic devices, implants and grafts, initial encounter: Secondary | ICD-10-CM | POA: Insufficient documentation

## 2012-02-22 DIAGNOSIS — Y832 Surgical operation with anastomosis, bypass or graft as the cause of abnormal reaction of the patient, or of later complication, without mention of misadventure at the time of the procedure: Secondary | ICD-10-CM | POA: Insufficient documentation

## 2012-02-22 DIAGNOSIS — E785 Hyperlipidemia, unspecified: Secondary | ICD-10-CM | POA: Insufficient documentation

## 2012-02-22 DIAGNOSIS — I10 Essential (primary) hypertension: Secondary | ICD-10-CM | POA: Insufficient documentation

## 2012-02-22 DIAGNOSIS — Z950 Presence of cardiac pacemaker: Secondary | ICD-10-CM | POA: Insufficient documentation

## 2012-02-22 DIAGNOSIS — I2510 Atherosclerotic heart disease of native coronary artery without angina pectoris: Secondary | ICD-10-CM | POA: Insufficient documentation

## 2012-02-22 HISTORY — PX: CAROTID ANGIOGRAM: SHX5504

## 2012-02-22 HISTORY — PX: ABDOMINAL AORTAGRAM: SHX5454

## 2012-02-22 LAB — PROTIME-INR
INR: 1.08 (ref 0.00–1.49)
Prothrombin Time: 14.2 seconds (ref 11.6–15.2)

## 2012-02-22 LAB — POCT I-STAT, CHEM 8
Hemoglobin: 16.3 g/dL (ref 13.0–17.0)
Sodium: 141 mEq/L (ref 135–145)
TCO2: 31 mmol/L (ref 0–100)

## 2012-02-22 LAB — APTT: aPTT: 33 seconds (ref 24–37)

## 2012-02-22 SURGERY — ABDOMINAL AORTAGRAM
Anesthesia: LOCAL

## 2012-02-22 MED ORDER — HYDRALAZINE HCL 20 MG/ML IJ SOLN
INTRAMUSCULAR | Status: AC
  Filled 2012-02-22: qty 2

## 2012-02-22 MED ORDER — OXYCODONE-ACETAMINOPHEN 5-325 MG PO TABS: 1.0000 | ORAL_TABLET | ORAL | Status: DC | PRN

## 2012-02-22 MED ORDER — LIDOCAINE HCL (PF) 1 % IJ SOLN
INTRAMUSCULAR | Status: AC
  Filled 2012-02-22: qty 30

## 2012-02-22 MED ORDER — MORPHINE SULFATE 10 MG/ML IJ SOLN: 2.0000 mg | INTRAMUSCULAR | Status: DC | PRN

## 2012-02-22 MED ORDER — GUAIFENESIN-DM 100-10 MG/5ML PO SYRP: 15.0000 mL | ORAL_SOLUTION | ORAL | Status: DC | PRN

## 2012-02-22 MED ORDER — LABETALOL HCL 5 MG/ML IV SOLN: 10.0000 mg | INTRAVENOUS | Status: DC | PRN

## 2012-02-22 MED ORDER — HEPARIN (PORCINE) IN NACL 2-0.9 UNIT/ML-% IJ SOLN
INTRAMUSCULAR | Status: AC
  Filled 2012-02-22: qty 1000

## 2012-02-22 MED ORDER — ONDANSETRON HCL 4 MG/2ML IJ SOLN: 4.0000 mg | Freq: Four times a day (QID) | INTRAMUSCULAR | Status: DC | PRN

## 2012-02-22 MED ORDER — ACETAMINOPHEN 325 MG RE SUPP: 325.0000 mg | RECTAL | Status: DC | PRN

## 2012-02-22 MED ORDER — ACETAMINOPHEN 325 MG PO TABS: 325.0000 mg | ORAL_TABLET | ORAL | Status: DC | PRN

## 2012-02-22 MED ORDER — HYDRALAZINE HCL 20 MG/ML IJ SOLN: 10.0000 mg | INTRAMUSCULAR | Status: DC | PRN

## 2012-02-22 MED ORDER — SODIUM CHLORIDE 0.9 % IV SOLN: INTRAVENOUS | Status: DC

## 2012-02-22 MED ORDER — PHENOL 1.4 % MT LIQD: 1.0000 | OROMUCOSAL | Status: DC | PRN

## 2012-02-22 MED ORDER — METOPROLOL TARTRATE 1 MG/ML IV SOLN: 2.0000 mg | INTRAVENOUS | Status: DC | PRN

## 2012-02-22 NOTE — H&P (View-Only) (Signed)
[facility] [place]   Patient name: [name] MRN: [id number] DOB: Feb 25, 1927 Sex: male     Chief Complaint  Patient presents with  . Carotid    6 month f/up - w/Labs    HISTORY OF PRESENT ILLNESS: The patient comes in today for followup. He has a history of a right femoral to above-knee popliteal artery bypass graft by Dr. early in 2009. The patient does complain of right calf claudication. The patient also has a history of undergoing left carotid stenting in the setting of a symptomatic stenosis. His stent was placed on July 12. He was stented because he had a heart attack 2 months prior. Postoperatively he developed a fascial. He underwent full stroke workup. Within the course of the next 24 hours his deficits resolved he was discharged to home. He did re\re presented to the hospital with similar symptoms to 3 weeks later. It was felt that his symptoms were related to intracranial disease. He's back today for followup. He does not endorse any new neurologic events. He feels like he is back to his baseline.  Past Medical History  Diagnosis Date  . Hypertension   . CVA (cerebral vascular accident)   . Pacemaker   . CAD (coronary artery disease)   . Hx of CABG   . MI (myocardial infarction)   . CAD (coronary artery disease)   . TIA (transient ischemic attack)   . Hyperlipidemia     Past Surgical History  Procedure Date  . Coronary artery bypass graft   . Femoral to akpopliteal bypass graft   . Carotid stent 06/23/2011    left    History   Social History  . Marital Status: Married    Spouse Name: N/A    Number of Children: N/A  . Years of Education: N/A   Occupational History  . Not on file.   Social History Main Topics  . Smoking status: Former Smoker    Types: Cigarettes  . Smokeless tobacco: Former Neurosurgeon    Quit date: 06/02/2011  . Alcohol Use: No  . Drug Use: No  . Sexually Active: Not on file   Other Topics Concern  . Not on file    Social History Narrative  . No narrative on file    Family History  Problem Relation Age of Onset  . Cancer Brother 60    oral cawncer  . Cancer Brother     prostate ca    Allergies as of 02/07/2012 - Review Complete 02/07/2012  Allergen Reaction Noted  . Pravachol  08/02/2011  . Simvastatin  08/02/2011    Current Outpatient Prescriptions on File Prior to Visit  Medication Sig Dispense Refill  . amLODipine (NORVASC) 5 MG tablet Take 5 mg by mouth daily.        Marland Kitchen aspirin 81 MG tablet Take 81 mg by mouth daily.        . clopidogrel (PLAVIX) 75 MG tablet Take 75 mg by mouth daily.        Marland Kitchen dicyclomine (BENTYL) 20 MG tablet Take 20 mg by mouth every 6 (six) hours.        . hydrochlorothiazide 25 MG tablet Take 25 mg by mouth daily.        Marland Kitchen ibuprofen (ADVIL,MOTRIN) 600 MG tablet Take 600 mg by mouth every 6 (six) hours as needed.        . isosorbide mononitrate (IMDUR) 60 MG 24 hr tablet Take 60 mg by mouth daily.        Marland Kitchen  lisinopril (PRINIVIL,ZESTRIL) 20 MG tablet Take 20 mg by mouth daily.        . metoprolol (TOPROL-XL) 50 MG 24 hr tablet Take 50 mg by mouth daily.        . nitroGLYCERIN (NITROSTAT) 0.4 MG SL tablet Place 0.4 mg under the tongue every 5 (five) minutes as needed.        . potassium chloride SA (K-DUR,KLOR-CON) 20 MEQ tablet Take 20 mEq by mouth 2 (two) times daily.        . rosuvastatin (CRESTOR) 20 MG tablet Take 20 mg by mouth daily. 1/2 tab daily       . Tamsulosin HCl (FLOMAX) 0.4 MG CAPS Take 0.4 mg by mouth daily after supper.           REVIEW OF SYSTEMS: No changes  PHYSICAL EXAMINATION:   Vital signs are BP 167/80  Pulse 72  Resp 16  Ht 5\' 9"  (1.753 m)  Wt 143 lb (64.864 kg)  BMI 21.12 kg/m2  SpO2 100% General: The patient appears their stated age. HEENT:  No gross abnormalities Pulmonary:  Non labored breathing Abdomen: Soft and non-tender Musculoskeletal: There are no major deformities. Neurologic: No focal weakness or paresthesias are  detected, Skin: There are no ulcer or rashes noted. Psychiatric: The patient has normal affect. Cardiovascular: Pedal pulses are nonpalpable bilaterally. He has significant edema in his legs.   Diagnostic Studies Duplex ultrasound was performed today this shows an occluded right femoral popliteal bypass graft with an ankle-brachial index of 0.66 on the right and 0.71 was  Carotid ultrasound shows 80-99% stenosis within the previously deployed stent. There is also significant plaque in the proximal common carotid artery on the right with no significant internal carotid stenosis.  Assessment: #1 claudication #2  carotid stenosis Plan: The patient will be scheduled for angiogram. I will plan on accessing his left groin. I will study his abdominal aorta and bilateral lower extremity with emphasis on the right leg to see what his options are for revascularization. In addition I will plan on studying both carotid arteries particularly paying attention to the stent placed in his left carotid artery. By duplex ultrasound there is greater than 80% stenosis. I want to confirm this finding and then plan for possible restenting at a later time this is been scheduled for Tuesday, March 19  V. Charlena Cross, M.D. [facility] [place] Office: [phone] Pager:  [phone]

## 2012-02-22 NOTE — Progress Notes (Signed)
Pt's potassium of 3 reported to [facility], R.N.

## 2012-02-22 NOTE — Op Note (Signed)
[facility] [place]  Patient name: [name] MRN: [id number] DOB: October 03, 1927 Sex: male  02/22/2012 Pre-operative Diagnosis: Carotid stenosis, right leg claudication Post-operative diagnosis:  Same Surgeon:  Jorge Ny Procedure Performed:  1.  ultrasound access left femoral artery  2.  aortic arch angiogram  3.  right carotid angiogram (first order catheterization, I innominate artery)  4.  left carotid angiogram (first-order catheterization)  5.  abdominal aortogram  6.  right leg runoff (peripheral catheterization, right common iliac artery)  7.    Indications:  The patient was seen for routine followup recently. He was found to have elevated velocities within his left carotid stent which was placed approximately 8 months ago for symptomatic stenosis. In addition he was complaining of right leg claudication he has a history of a right femoral-popliteal bypass graft by Dr. early which is known to be occluded. He comes in today for further diagnostic information.  Procedure:  The patient was identified in the holding area and taken to room 8.  The patient was then placed supine on the table and prepped and draped in the usual sterile fashion.  A time out was called.  Ultrasound was used to evaluate the left common femoral artery.  It was patent .  A digital ultrasound image was acquired.  The left common femoral artery was then accessed under ultrasound guidance with an 18-gauge needle. An 035 wire was advanced until it met resistance in the iliac system. A 5 French sheath was placed. Next using a KM P. catheter I was able to get wire access into the aorta. Over the wire a pigtail catheter was advanced into a sitting aorta and an aortic arch angiogram was performed. I then selectively cannulated the innominate artery and left carotid artery with a Simmons 1 catheter and performed a right and left carotid angiograms.  Next, the catheter was pulled down to the  level of L1 and an abdominal aortogram was performed. I didn't try to cross the aortic bifurcation with a Omni flush catheter and a Glidewire. I had a lot of difficulty getting the Glidewire to navigate through the tortuous iliac system. Ultimately I was able to advance a Glidewire into the profunda on the right but I could not get a catheter past the distal common iliac artery. I left the catheter in the distal common iliac artery and performed right leg runoff with the catheter in this position.  Findings:   Arch Angiogram:  A type II aortic arch is identified. There is no evidence of innominate artery or proximal right subclavian artery stenosis. The right common carotid artery is widely patent. The left common carotid artery is widely patent the left subclavian artery is widely patent. The origin of the left vertebral artery is widely patent  Right carotid artery:  There is approximately a 50% right vertebral artery stenosis no hemodynamically significant stenosis is visualized within the right carotid bifurcation. The visualized portions of the right internal and external carotid artery are widely patent  Left carotid artery:  The left common carotid artery is widely patent the left common carotid and internal carotid artery stent are patent. There is intimal hyperplasia at the distal end of the stent accounting for approximately 40% stenosis. At the distal end of the stent there is a mild cheek which is responsible for approximately a 75% stenosis at the distal end of the stent. The remaining portions of the distal internal carotid artery are widely patent.  Aortogram:  The infrarenal abdominal aorta is patent throughout it's course. No significant disease is visualized within the suprarenal abdominal aorta. There are single renal arteries bilaterally which are widely patent. There is mild aneurysmal degeneration of the infrarenal abdominal aorta ending at the aortic bifurcation. Bilateral common iliac  arteries are widely patent. Bilateral hypogastric arteries are patent however there is stenosis which is not well defined within both internal iliac arteries. Both external iliac arteries show mild diffuse disease throughout their course. There is no hemodynamically significant stenosis identified.  Right Lower Extremity:  The right common femoral artery is widely patent. The right profunda femoral artery is patent however there is diffuse disease noted throughout. There is a high-grade stenosis within one of the distal main profunda femoral branches. This is in the mid to upper thigh. The superficial femoral artery shows a flush occlusion. There is reconstitution of the popliteal artery above the knee just beyond the adductor canal. The right fmoral-popliteal bypass graft is occluded. Once reconstituted, the popliteal artery is patent throughout it's course.  The right anterior tibial artery is occluded. There is 2 vessel runoff via the posterior tibial and peroneal artery. Images of the foot were unable to be obtained the 2 proximal disease    Intervention:  None  Impression:  #1  Approximately a 75% stenosis identified within the distal left carotid stent. This is associated with mild    neointimal hyperplasia within the distal stent. There is a change within the internal carotid artery at the    distal portion of the stent which likely is the cause of the stenosis.  #2   60% right vertebral artery stenosis at its origin   #3   infrarenal abdominal aortic aneurysm. The dimensions are not well defined on angiography.  #4   occluded right femoral-popliteal bypass graft   #5   reconstitution of the above-knee popliteal artery with two-vessel runoff via the posterior tibial and peroneal    artery. There is a high-grade profundofemoral stenosis within one of it's distal branches    V. Durene Cal, M.D. [facility] [place] Office: [phone] Pager:  [phone]

## 2012-02-22 NOTE — Interval H&P Note (Signed)
History and Physical Interval Note:  02/22/2012 8:40 AM  [name]  has presented today for surgery, with the diagnosis of PVD  The various methods of treatment have been discussed with the patient and family. After consideration of risks, benefits and other options for treatment, the patient has consented to  Procedure(s) (LRB): ABDOMINAL AORTAGRAM (N/A) CAROTID ANGIOGRAM (Bilateral) as a surgical intervention .  The patients' history has been reviewed, patient examined, no change in status, stable for surgery.  I have reviewed the patients' chart and labs.  Questions were answered to the patient's satisfaction.     Michaele Amundson IV, V. WELLS

## 2012-02-22 NOTE — Brief Op Note (Signed)
02/22/2012  6:29 PM  PATIENT:  [name]  78 y.o. male  PRE-OPERATIVE DIAGNOSIS:  PVD  POST-OPERATIVE DIAGNOSIS:  Same  PROCEDURE:  Procedure(s) (LRB): ABDOMINAL AORTAGRAM (N/A) CAROTID ANGIOGRAM (Bilateral)  SURGEON:  Surgeon(s) and Role:    Nada Libman, MD - Primary  PHYSICIAN ASSISTANT:   ASSISTANTS: none   ANESTHESIA:   local  EBL:     BLOOD ADMINISTERED:none  DRAINS: none   LOCAL MEDICATIONS USED:  LIDOCAINE   SPECIMEN:  No Specimen  DISPOSITION OF SPECIMEN:  N/A  COUNTS:  YES  TOURNIQUET:  * No tourniquets in log *  DICTATION: .Dragon Dictation  PLAN OF CARE: Discharge to home after PACU  PATIENT DISPOSITION:  PACU - hemodynamically stable.   Delay start of Pharmacological VTE agent (>24hrs) due to surgical blood loss or risk of bleeding: not applicable

## 2012-02-22 NOTE — Discharge Instructions (Signed)

## 2012-02-23 ENCOUNTER — Other Ambulatory Visit: Payer: Self-pay | Admitting: *Deleted

## 2012-02-23 DIAGNOSIS — I70219 Atherosclerosis of native arteries of extremities with intermittent claudication, unspecified extremity: Secondary | ICD-10-CM

## 2012-02-23 DIAGNOSIS — I6529 Occlusion and stenosis of unspecified carotid artery: Secondary | ICD-10-CM

## 2012-04-05 ENCOUNTER — Other Ambulatory Visit: Payer: Self-pay | Admitting: Cardiovascular Disease

## 2012-04-05 DIAGNOSIS — R109 Unspecified abdominal pain: Secondary | ICD-10-CM

## 2012-04-06 ENCOUNTER — Ambulatory Visit
Admission: RE | Admit: 2012-04-06 | Discharge: 2012-04-06 | Disposition: A | Discharge status: Home / Self Care | Payer: Medicare Other | Source: Ambulatory Visit | Attending: Cardiovascular Disease | Admitting: Cardiovascular Disease

## 2012-04-06 DIAGNOSIS — R109 Unspecified abdominal pain: Secondary | ICD-10-CM

## 2012-04-06 MED ORDER — IOHEXOL 300 MG/ML  SOLN
100.0000 mL | Freq: Once | INTRAMUSCULAR | Status: AC | PRN
  Administered 2012-04-06: 100 mL via INTRAVENOUS

## 2012-04-19 DIAGNOSIS — K635 Polyp of colon: Secondary | ICD-10-CM | POA: Insufficient documentation

## 2012-08-25 ENCOUNTER — Encounter: Payer: Self-pay | Admitting: Surgery

## 2012-08-28 ENCOUNTER — Encounter: Payer: Self-pay | Admitting: Surgery

## 2012-08-28 ENCOUNTER — Encounter (INDEPENDENT_AMBULATORY_CARE_PROVIDER_SITE_OTHER): Payer: Medicare Other | Admitting: *Deleted

## 2012-08-28 ENCOUNTER — Ambulatory Visit (INDEPENDENT_AMBULATORY_CARE_PROVIDER_SITE_OTHER): Payer: Medicare Other | Admitting: Surgery

## 2012-08-28 ENCOUNTER — Other Ambulatory Visit (INDEPENDENT_AMBULATORY_CARE_PROVIDER_SITE_OTHER): Payer: Medicare Other | Admitting: *Deleted

## 2012-08-28 VITALS — BP 151/81 | HR 77 | Resp 16 | Ht 69.0 in | Wt 140.0 lb

## 2012-08-28 DIAGNOSIS — I6529 Occlusion and stenosis of unspecified carotid artery: Secondary | ICD-10-CM

## 2012-08-28 DIAGNOSIS — Z48812 Encounter for surgical aftercare following surgery on the circulatory system: Secondary | ICD-10-CM

## 2012-08-28 DIAGNOSIS — I70219 Atherosclerosis of native arteries of extremities with intermittent claudication, unspecified extremity: Secondary | ICD-10-CM

## 2012-08-28 NOTE — Progress Notes (Signed)
[facility]   Patient name: [name] MRN: [id number] DOB: [birth year] Sex: male     Chief Complaint  Patient presents with  . Carotid    6 month f/u with ABI's    HISTORY OF PRESENT ILLNESS: The patient comes in today for followup. He has a history of a right femoral to above-knee popliteal artery bypass graft by Dr. early in 2009. The patient does complain of right calf claudication which is not lifestyle limiting.. The patient also has a history of undergoing left carotid stenting in the setting of a symptomatic stenosis. His stent was placed on June 17, 2011. He was stented because he had a heart attack 2 months prior to his neurologic event.. Postoperatively he developed asphasia. He underwent full stroke workup. Within the course of the next 24 hours his deficits completely resolved he was discharged to home. He did re-presented to the hospital with similar symptoms to 3 weeks later. It was felt that his symptoms were related to intracranial disease. He was treated medically after confirming that his stent was widely patent.   He's back today for followup. He does not endorse any new neurologic events. He feels like he is back to his baseline. He does complain of some arthritic pain in his right hand and left forearm, and swelling in his right leg which has been present since his bypass. He has tried compression stockings but does not like to wear them. 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   Past Medical History  Diagnosis Date  . Hypertension   . CVA (cerebral vascular accident)   . Pacemaker   . CAD (coronary artery disease)   . Hx of CABG   . MI (myocardial infarction)   . CAD (coronary artery disease)   . TIA (transient ischemic attack)   . Hyperlipidemia     Past Surgical History  Procedure Date  . Coronary artery bypass graft   . Femoral to akpopliteal bypass graft   . Carotid stent 06/23/2011    left    History    Social History  . Marital Status: Married    Spouse Name: N/A    Number of Children: N/A  . Years of Education: N/A   Occupational History  . Not on file.   Social History Main Topics  . Smoking status: Former Smoker    Types: Cigarettes  . Smokeless tobacco: Former Neurosurgeon    Quit date: 06/02/2011  . Alcohol Use: No  . Drug Use: No  . Sexually Active: Not on file   Other Topics Concern  . Not on file   Social History Narrative  . No narrative on file    Family History  Problem Relation Age of Onset  . Cancer Brother 60    oral cawncer  . Cancer Brother     prostate ca    Allergies as of 08/28/2012 - Review Complete 08/28/2012  Allergen Reaction Noted  . Pravachol  08/02/2011  . Simvastatin  08/02/2011    Current Outpatient Prescriptions on File Prior to Visit  Medication Sig Dispense Refill  . amLODipine (NORVASC) 5 MG tablet Take 5 mg by mouth daily.        Marland Kitchen aspirin 81 MG tablet Take 81 mg by mouth daily.        . clopidogrel (PLAVIX) 75 MG tablet Take 75 mg by mouth daily.        Marland Kitchen dicyclomine (BENTYL) 20 MG tablet  Take 20 mg by mouth every 6 (six) hours.        . hydrochlorothiazide 25 MG tablet Take 25 mg by mouth daily.        Marland Kitchen ibuprofen (ADVIL,MOTRIN) 600 MG tablet Take 600 mg by mouth every 6 (six) hours as needed.        . isosorbide mononitrate (IMDUR) 60 MG 24 hr tablet Take 60 mg by mouth daily.        Marland Kitchen lisinopril (PRINIVIL,ZESTRIL) 20 MG tablet Take 20 mg by mouth daily.        . metoprolol (TOPROL-XL) 50 MG 24 hr tablet Take 50 mg by mouth daily.        . nitroGLYCERIN (NITROSTAT) 0.4 MG SL tablet Place 0.4 mg under the tongue every 5 (five) minutes as needed. For chest pain      . potassium chloride SA (K-DUR,KLOR-CON) 20 MEQ tablet Take 20 mEq by mouth 2 (two) times daily.        . Pyridoxine HCl (B-6 PO) Take 1 tablet by mouth daily.      . rosuvastatin (CRESTOR) 20 MG tablet Take 20 mg by mouth daily. 1/2 tab daily       . Tamsulosin HCl  (FLOMAX) 0.4 MG CAPS Take 0.4 mg by mouth daily after supper.           REVIEW OF SYSTEMS: Please see history of present illness, otherwise negative  PHYSICAL EXAMINATION:   Vital signs are BP 151/81  Pulse 77  Resp 16  Ht 5\' 9"  (1.753 m)  Wt 140 lb (63.504 kg)  BMI 20.67 kg/m2  SpO2 100% General: The patient appears their stated age. HEENT:  No gross abnormalities Pulmonary:  Non labored breathing Musculoskeletal: There are no major deformities. Neurologic: No focal weakness or paresthesias are detected, Skin: There are no ulcer or rashes noted. Psychiatric: The patient has normal affect. Cardiovascular: There is a regular rate and rhythm without significant murmur appreciated.   Diagnostic Studies Carotid duplex was and reviewed today. This shows 40-59% stenosis within the left carotid artery. There is a 40% stenosis of the right internal carotid artery. ABI on the right is 0.69 on the left is 0.68  Assessment: #1 carotid occlusive disease, status post left carotid stent #2 vascular disease, known right femoral-popliteal bypass graft occlusion Plan: #1: The patient remains asymptomatic. He is to continue his antiplatelet therapy. I will repeat his ultrasound 1 year. I did discuss the findings of in-stent stenosis with the patient today. #2: The patient's claudication symptoms have remained stable.  Jorge Ny, M.D. [facility] [place] [facility] Office: [phone] Pager:  [phone]

## 2012-08-29 NOTE — Addendum Note (Signed)
Addended by: Sharee Pimple on: 08/29/2012 08:50 AM   Modules accepted: Orders

## 2013-03-21 ENCOUNTER — Other Ambulatory Visit (HOSPITAL_COMMUNITY): Payer: Self-pay | Admitting: Cardiovascular Disease

## 2013-03-21 DIAGNOSIS — Z950 Presence of cardiac pacemaker: Secondary | ICD-10-CM

## 2013-03-21 DIAGNOSIS — I714 Abdominal aortic aneurysm, without rupture: Secondary | ICD-10-CM

## 2013-03-21 DIAGNOSIS — I447 Left bundle-branch block, unspecified: Secondary | ICD-10-CM

## 2013-03-26 ENCOUNTER — Encounter (HOSPITAL_COMMUNITY): Payer: Medicare Other

## 2013-03-26 ENCOUNTER — Ambulatory Visit (HOSPITAL_COMMUNITY): Payer: Medicare Other

## 2013-03-29 ENCOUNTER — Ambulatory Visit (HOSPITAL_COMMUNITY)
Admission: RE | Admit: 2013-03-29 | Discharge: 2013-03-29 | Disposition: A | Discharge status: Home / Self Care | Payer: Medicare Other | Source: Ambulatory Visit | Attending: Cardiovascular Disease | Admitting: Cardiovascular Disease

## 2013-03-29 DIAGNOSIS — I447 Left bundle-branch block, unspecified: Secondary | ICD-10-CM

## 2013-03-29 DIAGNOSIS — Z950 Presence of cardiac pacemaker: Secondary | ICD-10-CM | POA: Insufficient documentation

## 2013-03-29 DIAGNOSIS — I714 Abdominal aortic aneurysm, without rupture, unspecified: Secondary | ICD-10-CM | POA: Insufficient documentation

## 2013-03-29 DIAGNOSIS — I059 Rheumatic mitral valve disease, unspecified: Secondary | ICD-10-CM | POA: Insufficient documentation

## 2013-03-29 DIAGNOSIS — I079 Rheumatic tricuspid valve disease, unspecified: Secondary | ICD-10-CM | POA: Insufficient documentation

## 2013-03-29 DIAGNOSIS — I2510 Atherosclerotic heart disease of native coronary artery without angina pectoris: Secondary | ICD-10-CM | POA: Insufficient documentation

## 2013-03-29 NOTE — Progress Notes (Signed)
2D Echo Performed 03/29/2013    Tykee Heideman, RCS  

## 2013-03-29 NOTE — Progress Notes (Signed)
Aorta Duplex Completed. Saba Neuman D  

## 2013-05-04 ENCOUNTER — Inpatient Hospital Stay (HOSPITAL_COMMUNITY)
Admission: EM | Admit: 2013-05-04 | Discharge: 2013-05-06 | DRG: 293 | Disposition: A | Discharge status: Home / Self Care | Payer: Medicare Other | Attending: Internal Medicine | Admitting: Internal Medicine

## 2013-05-04 ENCOUNTER — Encounter (HOSPITAL_COMMUNITY): Payer: Self-pay | Admitting: *Deleted

## 2013-05-04 ENCOUNTER — Emergency Department (HOSPITAL_COMMUNITY): Payer: Medicare Other

## 2013-05-04 DIAGNOSIS — I5043 Acute on chronic combined systolic (congestive) and diastolic (congestive) heart failure: Secondary | ICD-10-CM | POA: Insufficient documentation

## 2013-05-04 DIAGNOSIS — I2510 Atherosclerotic heart disease of native coronary artery without angina pectoris: Secondary | ICD-10-CM | POA: Diagnosis present

## 2013-05-04 DIAGNOSIS — Z8673 Personal history of transient ischemic attack (TIA), and cerebral infarction without residual deficits: Secondary | ICD-10-CM | POA: Insufficient documentation

## 2013-05-04 DIAGNOSIS — E785 Hyperlipidemia, unspecified: Secondary | ICD-10-CM | POA: Diagnosis present

## 2013-05-04 DIAGNOSIS — Z720 Tobacco use: Secondary | ICD-10-CM

## 2013-05-04 DIAGNOSIS — I509 Heart failure, unspecified: Secondary | ICD-10-CM | POA: Diagnosis present

## 2013-05-04 DIAGNOSIS — Z950 Presence of cardiac pacemaker: Secondary | ICD-10-CM

## 2013-05-04 DIAGNOSIS — Z7902 Long term (current) use of antithrombotics/antiplatelets: Secondary | ICD-10-CM

## 2013-05-04 DIAGNOSIS — I5023 Acute on chronic systolic (congestive) heart failure: Secondary | ICD-10-CM

## 2013-05-04 DIAGNOSIS — Z79899 Other long term (current) drug therapy: Secondary | ICD-10-CM

## 2013-05-04 DIAGNOSIS — D696 Thrombocytopenia, unspecified: Secondary | ICD-10-CM | POA: Diagnosis present

## 2013-05-04 DIAGNOSIS — I252 Old myocardial infarction: Secondary | ICD-10-CM

## 2013-05-04 DIAGNOSIS — F17200 Nicotine dependence, unspecified, uncomplicated: Secondary | ICD-10-CM | POA: Diagnosis present

## 2013-05-04 DIAGNOSIS — I10 Essential (primary) hypertension: Secondary | ICD-10-CM | POA: Diagnosis present

## 2013-05-04 DIAGNOSIS — Z23 Encounter for immunization: Secondary | ICD-10-CM

## 2013-05-04 DIAGNOSIS — Z951 Presence of aortocoronary bypass graft: Secondary | ICD-10-CM

## 2013-05-04 LAB — BASIC METABOLIC PANEL
CO2: 25 mEq/L (ref 19–32)
Chloride: 106 mEq/L (ref 96–112)
Sodium: 139 mEq/L (ref 135–145)

## 2013-05-04 LAB — CBC
MCV: 93.4 fL (ref 78.0–100.0)
Platelets: 121 10*3/uL — ABNORMAL LOW (ref 150–400)
RBC: 4.42 MIL/uL (ref 4.22–5.81)
WBC: 9.2 10*3/uL (ref 4.0–10.5)

## 2013-05-04 LAB — PRO B NATRIURETIC PEPTIDE: Pro B Natriuretic peptide (BNP): 6583 pg/mL — ABNORMAL HIGH (ref 0–450)

## 2013-05-04 MED ORDER — ONDANSETRON HCL 4 MG PO TABS: 4.0000 mg | ORAL_TABLET | Freq: Four times a day (QID) | ORAL | Status: DC | PRN

## 2013-05-04 MED ORDER — DEXTROSE 5 % IV SOLN
1.0000 g | INTRAVENOUS | Status: DC
  Administered 2013-05-04: 1 g via INTRAVENOUS
  Filled 2013-05-04: qty 10

## 2013-05-04 MED ORDER — METOPROLOL SUCCINATE ER 50 MG PO TB24
50.0000 mg | ORAL_TABLET | Freq: Every day | ORAL | Status: DC
  Administered 2013-05-05 – 2013-05-06 (×2): 50 mg via ORAL
  Filled 2013-05-04 (×2): qty 1

## 2013-05-04 MED ORDER — AMLODIPINE BESYLATE 5 MG PO TABS
5.0000 mg | ORAL_TABLET | Freq: Every day | ORAL | Status: DC
  Administered 2013-05-05 – 2013-05-06 (×2): 5 mg via ORAL
  Filled 2013-05-04 (×2): qty 1

## 2013-05-04 MED ORDER — ASPIRIN 81 MG PO TABS: 81.0000 mg | ORAL_TABLET | Freq: Every day | ORAL | Status: DC

## 2013-05-04 MED ORDER — ACETAMINOPHEN 325 MG PO TABS: 650.0000 mg | ORAL_TABLET | Freq: Four times a day (QID) | ORAL | Status: DC | PRN

## 2013-05-04 MED ORDER — ACETAMINOPHEN 650 MG RE SUPP: 650.0000 mg | Freq: Four times a day (QID) | RECTAL | Status: DC | PRN

## 2013-05-04 MED ORDER — SODIUM CHLORIDE 0.9 % IJ SOLN
3.0000 mL | Freq: Two times a day (BID) | INTRAMUSCULAR | Status: DC
  Administered 2013-05-04 – 2013-05-06 (×4): 3 mL via INTRAVENOUS

## 2013-05-04 MED ORDER — FUROSEMIDE 10 MG/ML IJ SOLN
40.0000 mg | Freq: Once | INTRAMUSCULAR | Status: AC
  Administered 2013-05-04: 40 mg via INTRAVENOUS
  Filled 2013-05-04: qty 4

## 2013-05-04 MED ORDER — FUROSEMIDE 10 MG/ML IJ SOLN
40.0000 mg | Freq: Two times a day (BID) | INTRAMUSCULAR | Status: DC
  Administered 2013-05-05: 40 mg via INTRAVENOUS
  Filled 2013-05-04 (×2): qty 4

## 2013-05-04 MED ORDER — CLOPIDOGREL BISULFATE 75 MG PO TABS
75.0000 mg | ORAL_TABLET | Freq: Every day | ORAL | Status: DC
  Administered 2013-05-05 – 2013-05-06 (×2): 75 mg via ORAL
  Filled 2013-05-04 (×3): qty 1

## 2013-05-04 MED ORDER — ASPIRIN EC 81 MG PO TBEC
81.0000 mg | DELAYED_RELEASE_TABLET | Freq: Every day | ORAL | Status: DC
  Administered 2013-05-05 – 2013-05-06 (×2): 81 mg via ORAL
  Filled 2013-05-04 (×2): qty 1

## 2013-05-04 MED ORDER — POTASSIUM CHLORIDE CRYS ER 20 MEQ PO TBCR
20.0000 meq | EXTENDED_RELEASE_TABLET | Freq: Two times a day (BID) | ORAL | Status: DC
  Administered 2013-05-04 – 2013-05-06 (×4): 20 meq via ORAL
  Filled 2013-05-04 (×5): qty 1

## 2013-05-04 MED ORDER — AZITHROMYCIN 250 MG PO TABS
500.0000 mg | ORAL_TABLET | Freq: Once | ORAL | Status: AC
  Administered 2013-05-04: 500 mg via ORAL
  Filled 2013-05-04: qty 2

## 2013-05-04 MED ORDER — TAMSULOSIN HCL 0.4 MG PO CAPS
0.4000 mg | ORAL_CAPSULE | ORAL | Status: DC
  Administered 2013-05-05: 0.4 mg via ORAL
  Filled 2013-05-04: qty 1

## 2013-05-04 MED ORDER — NITROGLYCERIN 0.4 MG SL SUBL: 0.4000 mg | SUBLINGUAL_TABLET | SUBLINGUAL | Status: DC | PRN

## 2013-05-04 MED ORDER — ONDANSETRON HCL 4 MG/2ML IJ SOLN: 4.0000 mg | Freq: Four times a day (QID) | INTRAMUSCULAR | Status: DC | PRN

## 2013-05-04 MED ORDER — LISINOPRIL 20 MG PO TABS
20.0000 mg | ORAL_TABLET | Freq: Every day | ORAL | Status: DC
  Administered 2013-05-05 – 2013-05-06 (×2): 20 mg via ORAL
  Filled 2013-05-04 (×2): qty 1

## 2013-05-04 MED ORDER — ENOXAPARIN SODIUM 40 MG/0.4ML ~~LOC~~ SOLN
40.0000 mg | SUBCUTANEOUS | Status: DC
  Administered 2013-05-04 – 2013-05-05 (×2): 40 mg via SUBCUTANEOUS
  Filled 2013-05-04 (×3): qty 0.4

## 2013-05-04 MED ORDER — ISOSORBIDE MONONITRATE ER 60 MG PO TB24
60.0000 mg | ORAL_TABLET | Freq: Every day | ORAL | Status: DC
  Administered 2013-05-05 – 2013-05-06 (×2): 60 mg via ORAL
  Filled 2013-05-04 (×2): qty 1

## 2013-05-04 MED ORDER — ALBUTEROL SULFATE (5 MG/ML) 0.5% IN NEBU: 2.5000 mg | INHALATION_SOLUTION | RESPIRATORY_TRACT | Status: DC | PRN

## 2013-05-04 NOTE — ED Notes (Signed)
Pt reports sob that started yesterday, increases when lying down, has swelling to legs. Reports non productive cough. spo2 97% on room air.

## 2013-05-04 NOTE — ED Notes (Signed)
Called report to [facility] unit [id number].

## 2013-05-04 NOTE — H&P (Signed)
[facility] History and Physical  [name] GNF:[id number] DOB: [birth year] DOA: 05/04/2013  Referring physician: Dr. Dione Booze, EDP PCP: Delorse Lek, MD  OP Specialists:  1. Cardiology: Dr. Susa Griffins  Chief Complaint: Difficulty breathing  HPI: [name] is a 79 y.o. male with history of HTN, HL, CAD, CABG, MI, CVA, pacemaker presented to the ED on 05/04/13 with complaints of cough and difficulty breathing. Patient's wife recently demised. For last 3 days patient has been having mild nonproductive cough without associated fever, chills, chest pain or dyspnea. He woke up at 3 AM on 05/04/13 with gurgling in his chest associated with dyspnea. He got up and walked a little with improvement in his dyspnea. However when he returned to lay down on his bed, the dyspnea returned. She thereby got up and stayed on his recliner. He denies chest pain. He has chronic bilateral ankle edema right greater than left. No recent long-distance travel. She does not recollect history of CHF. He presented to the ED where chest x-ray was read as possible pneumonia. However his clinical picture is more suggestive of decompensated CHF. He has been given a dose of azithromycin and a hospitalist admission was requested.   Review of Systems: All systems reviewed and apart from history of presenting illness, are negative  Past Medical History  Diagnosis Date  . Hypertension   . CVA (cerebral vascular accident)   . Pacemaker   . CAD (coronary artery disease)   . Hx of CABG   . MI (myocardial infarction)   . CAD (coronary artery disease)   . TIA (transient ischemic attack)   . Hyperlipidemia    Past Surgical History  Procedure Laterality Date  . Coronary artery bypass graft    . Femoral to akpopliteal bypass graft    . Carotid stent  06/23/2011    left   Social History:  reports that he has been smoking Cigarettes.  He has been smoking about 0.00 packs per day. He quit smokeless tobacco use  about 23 months ago. He reports that he does not drink alcohol or use illicit drugs. Widowed recently. Independent of activities of daily living.  Allergies  Allergen Reactions  . Pravachol     myalgias  . Simvastatin     myalgia    Family History  Problem Relation Age of Onset  . Cancer Brother 60    oral cawncer  . Cancer Brother     prostate ca  . Hypertension Brother   . Hyperlipidemia Brother     Prior to Admission medications   Medication Sig Start Date End Date Taking? Authorizing Provider  amLODipine (NORVASC) 5 MG tablet Take 5 mg by mouth daily.     Yes Historical Provider, MD  aspirin 81 MG tablet Take 81 mg by mouth daily.     Yes Historical Provider, MD  clopidogrel (PLAVIX) 75 MG tablet Take 75 mg by mouth daily.     Yes Historical Provider, MD  hydrochlorothiazide 25 MG tablet Take 25 mg by mouth daily.     Yes Historical Provider, MD  ibuprofen (ADVIL,MOTRIN) 600 MG tablet Take 600 mg by mouth every 6 (six) hours as needed for pain.    Yes Historical Provider, MD  isosorbide mononitrate (IMDUR) 60 MG 24 hr tablet Take 60 mg by mouth daily.     Yes Historical Provider, MD  lisinopril (PRINIVIL,ZESTRIL) 20 MG tablet Take 20 mg by mouth daily.     Yes Historical Provider, MD  metoprolol (TOPROL-XL) 50  MG 24 hr tablet Take 50 mg by mouth daily.     Yes Historical Provider, MD  potassium chloride SA (K-DUR,KLOR-CON) 20 MEQ tablet Take 20 mEq by mouth 2 (two) times daily.     Yes Historical Provider, MD  Pyridoxine HCl (B-6 PO) Take 1 tablet by mouth daily.   Yes Historical Provider, MD  rosuvastatin (CRESTOR) 20 MG tablet Take 20 mg by mouth daily. 1/2 tab daily    Yes Historical Provider, MD  Tamsulosin HCl (FLOMAX) 0.4 MG CAPS Take 0.4 mg by mouth daily after supper.     Yes Historical Provider, MD  nitroGLYCERIN (NITROSTAT) 0.4 MG SL tablet Place 0.4 mg under the tongue every 5 (five) minutes as needed. For chest pain    Historical Provider, MD   Physical  Exam: Filed Vitals:   05/04/13 1524 05/04/13 1630 05/04/13 1631 05/04/13 1815  BP: 170/75 169/87  186/93  Pulse: 67 70  71  Temp: 97.8 F (36.6 C)  98.7 F (37.1 C)   TempSrc: Oral     Resp: 22 22  21   SpO2: 97% 96%  91%     General exam: Moderately built and nourished male patient, lying comfortably supine on the gurney in no obvious distress.  Head, eyes and ENT: Nontraumatic and normocephalic. Pupils equally reacting to light and accommodation. Oral mucosa moist. Bilateral arcus senilis.  Neck: Supple. No carotid bruit or thyromegaly. JVD +.  Lymphatics: No lymphadenopathy.  Respiratory system: Reduced breath sounds in the bases with bibasal fine crackles. Rest of lung fields are clear to auscultation. No increased work of breathing.  Cardiovascular system: S1 and S2 heard, RRR. No murmurs. Gallop +. Bilateral lower leg edema 1+ right greater than left.  Gastrointestinal system: Abdomen is nondistended, soft and nontender. Normal bowel sounds heard. No organomegaly or masses appreciated.  Central nervous system: Alert and oriented. No focal neurological deficits.  Extremities: Symmetric 5 x 5 power. Peripheral pulses symmetrically felt.  Skin: No rashes or acute findings.  Musculoskeletal system: Negative exam.  Psychiatry: Pleasant and cooperative.   Labs on Admission:  Basic Metabolic Panel:  Recent Labs Lab 05/04/13 1527  NA 139  K 3.7  CL 106  CO2 25  GLUCOSE 92  BUN 17  CREATININE 1.25  CALCIUM 8.5   Liver Function Tests: No results found for this basename: AST, ALT, ALKPHOS, BILITOT, PROT, ALBUMIN,  in the last 168 hours No results found for this basename: LIPASE, AMYLASE,  in the last 168 hours No results found for this basename: AMMONIA,  in the last 168 hours CBC:  Recent Labs Lab 05/04/13 1527  WBC 9.2  HGB 14.3  HCT 41.3  MCV 93.4  PLT 121*   Cardiac Enzymes: No results found for this basename: CKTOTAL, CKMB, CKMBINDEX, TROPONINI,   in the last 168 hours  BNP (last 3 results)  Recent Labs  05/04/13 1527  PROBNP 6583.0*   CBG: No results found for this basename: GLUCAP,  in the last 168 hours  Radiological Exams on Admission: Dg Chest 2 View  05/04/2013   *RADIOLOGY REPORT*  Clinical Data: Shortness of breath  CHEST - 2 VIEW  Comparison: 05/02/2011  Findings: Patchy left lower lobe opacity, suspicious for pneumonia.  Mild patchy right basilar opacity, some of which is likely chronic, although superimposed infection is suspected.  Small right pleural effusion.  Underlying chronic interstitial markings.  Cardiomegaly. Postsurgical changes related to prior CABG.  Left subclavian pacemaker.  IMPRESSION: Mild patchy bilateral lower lobe  opacities, suspicious for pneumonia.  Small right pleural effusion.   Original Report Authenticated By: Charline Bills, M.D.    EKG: Independently reviewed. Sinus rhythm, LAD,? RBBB-? Not new, atrial paced, T wave inversion in lateral leads and no acute findings.  Assessment/Plan Principal Problem:   Acute on chronic systolic CHF (congestive heart failure) Active Problems:   HTN (hypertension)   Hyperlipidemia   Tobacco abuse   History of CVA (cerebrovascular accident)   1. Acute on chronic systolic CHF: Admit to telemetry. EF on 4/24 was 35-40% with hypokinesis of inferior lateral and inferior myocardium. IV Lasix 40 mg every 12 hours. Strict input output. Monitor closely. May consider cardiology consultation if she does not clinically improve. Although chest x-ray has been reported as possible pneumonia, clinically does not seem like it. We'll cycle cardiac enzymes. 2. Hypertension, uncontrolled: Likely secondary to hyperadrenergic state from dyspnea In problem #1. Continue home medications and monitor closely. 3. Tobacco abuse: Cessation counseled. Patient declines nicotine patch. 4. Thrombocytopenia: Unclear etiology. Follow CBC in a.m. 5. CAD/CABG/pacemaker: Asymptomatic of  chest pain and no acute findings an EKG. 6. History of CVA: Continue antiplatelets.     Code Status: Full  Family Communication: Discussed with patient's son, brother and sister at bedside.  Disposition Plan: Home when medically stable.   Time spent: 60 minutes  [facility] Pager [phone]  If 7PM-7AM, please contact night-coverage [email or web address] Password [email or web address] 05/04/2013, 6:40 PM

## 2013-05-04 NOTE — ED Notes (Signed)
Patient states he just buried his wife on Tuesday.

## 2013-05-04 NOTE — ED Provider Notes (Signed)
History     CSN: [id number]  Arrival date & time 05/04/13  1519   First MD Initiated Contact with Patient 05/04/13 1547      Chief Complaint  Patient presents with  . Shortness of Breath    (Consider location/radiation/quality/duration/timing/severity/associated sxs/prior treatment) Patient is a 79 y.o. male presenting with shortness of breath. The history is provided by the patient.  Shortness of Breath He noticed onset last evening of difficulty breathing. Breathing is worse when laying flat but is better when he is walking. He he denies chest pain, heaviness, tightness, pressure. He denies fever, chills, sweats. There has been a slight cough which is nonproductive. He has not noticed any change in chronic leg swelling. Symptoms are moderate. He does relate that his wife died a week ago and the funeral was 3 days ago.  Past Medical History  Diagnosis Date  . Hypertension   . CVA (cerebral vascular accident)   . Pacemaker   . CAD (coronary artery disease)   . Hx of CABG   . MI (myocardial infarction)   . CAD (coronary artery disease)   . TIA (transient ischemic attack)   . Hyperlipidemia     Past Surgical History  Procedure Laterality Date  . Coronary artery bypass graft    . Femoral to akpopliteal bypass graft    . Carotid stent  06/23/2011    left    Family History  Problem Relation Age of Onset  . Cancer Brother 60    oral cawncer  . Cancer Brother     prostate ca    History  Substance Use Topics  . Smoking status: Former Smoker    Types: Cigarettes  . Smokeless tobacco: Former Neurosurgeon    Quit date: 06/02/2011  . Alcohol Use: No      Review of Systems  Respiratory: Positive for shortness of breath.   All other systems reviewed and are negative.    Allergies  Pravachol and Simvastatin  Home Medications   Current Outpatient Rx  Name  Route  Sig  Dispense  Refill  . amLODipine (NORVASC) 5 MG tablet   Oral   Take 5 mg by mouth daily.            Marland Kitchen aspirin 81 MG tablet   Oral   Take 81 mg by mouth daily.           . clopidogrel (PLAVIX) 75 MG tablet   Oral   Take 75 mg by mouth daily.           Marland Kitchen dicyclomine (BENTYL) 20 MG tablet   Oral   Take 20 mg by mouth every 6 (six) hours.           . hydrochlorothiazide 25 MG tablet   Oral   Take 25 mg by mouth daily.           Marland Kitchen ibuprofen (ADVIL,MOTRIN) 600 MG tablet   Oral   Take 600 mg by mouth every 6 (six) hours as needed.           . isosorbide mononitrate (IMDUR) 60 MG 24 hr tablet   Oral   Take 60 mg by mouth daily.           Marland Kitchen lisinopril (PRINIVIL,ZESTRIL) 20 MG tablet   Oral   Take 20 mg by mouth daily.           . metoprolol (TOPROL-XL) 50 MG 24 hr tablet   Oral   Take 50 mg by  mouth daily.           . nitroGLYCERIN (NITROSTAT) 0.4 MG SL tablet   Sublingual   Place 0.4 mg under the tongue every 5 (five) minutes as needed. For chest pain         . potassium chloride SA (K-DUR,KLOR-CON) 20 MEQ tablet   Oral   Take 20 mEq by mouth 2 (two) times daily.           . Pyridoxine HCl (B-6 PO)   Oral   Take 1 tablet by mouth daily.         . rosuvastatin (CRESTOR) 20 MG tablet   Oral   Take 20 mg by mouth daily. 1/2 tab daily          . Tamsulosin HCl (FLOMAX) 0.4 MG CAPS   Oral   Take 0.4 mg by mouth daily after supper.             BP 170/75  Pulse 67  Temp(Src) 97.8 F (36.6 C) (Oral)  Resp 22  SpO2 97%  Physical Exam  Nursing note and vitals reviewed.  79 year old male, resting comfortably and in no acute distress. Vital signs are significant for tachypnea with respiratory rate of 22, and hypertension with blood pressure 170/75. Oxygen saturation is 97%, which is normal. Head is normocephalic and atraumatic. PERRLA, EOMI. Oropharynx is clear. Neck is nontender and supple without adenopathy. JVD is present. Back is nontender and there is no CVA tenderness. There is 1+ presacral edema. Lungs have decreased airflow with  rales noted at the left base. Chest is nontender. Heart has regular rate and rhythm without murmur. Abdomen is soft, flat, nontender without masses or hepatosplenomegaly and peristalsis is normoactive. Extremities have 1+ pretibial edema, full range of motion is present. Skin is warm and dry without rash. Neurologic: Mental status is normal, cranial nerves are intact, there are no motor or sensory deficits.\  ED Course  Procedures (including critical care time)  Results for orders placed during the hospital encounter of 05/04/13  CBC      Result Value Range   WBC 9.2  4.0 - 10.5 K/uL   RBC 4.42  4.22 - 5.81 MIL/uL   Hemoglobin 14.3  13.0 - 17.0 g/dL   HCT 30.8  65.7 - 84.6 %   MCV 93.4  78.0 - 100.0 fL   MCH 32.4  26.0 - 34.0 pg   MCHC 34.6  30.0 - 36.0 g/dL   RDW 96.2  95.2 - 84.1 %   Platelets 121 (*) 150 - 400 K/uL  BASIC METABOLIC PANEL      Result Value Range   Sodium 139  135 - 145 mEq/L   Potassium 3.7  3.5 - 5.1 mEq/L   Chloride 106  96 - 112 mEq/L   CO2 25  19 - 32 mEq/L   Glucose, Bld 92  70 - 99 mg/dL   BUN 17  6 - 23 mg/dL   Creatinine, Ser 3.24  0.50 - 1.35 mg/dL   Calcium 8.5  8.4 - 40.1 mg/dL   GFR calc non Af Amer 51 (*) >90 mL/min   GFR calc Af Amer 59 (*) >90 mL/min  PRO B NATRIURETIC PEPTIDE      Result Value Range   Pro B Natriuretic peptide (BNP) 6583.0 (*) 0 - 450 pg/mL   Dg Chest 2 View  05/04/2013   *RADIOLOGY REPORT*  Clinical Data: Shortness of breath  CHEST - 2 VIEW  Comparison:  05/02/2011  Findings: Patchy left lower lobe opacity, suspicious for pneumonia.  Mild patchy right basilar opacity, some of which is likely chronic, although superimposed infection is suspected.  Small right pleural effusion.  Underlying chronic interstitial markings.  Cardiomegaly. Postsurgical changes related to prior CABG.  Left subclavian pacemaker.  IMPRESSION: Mild patchy bilateral lower lobe opacities, suspicious for pneumonia.  Small right pleural effusion.   Original  Report Authenticated By: Charline Bills, M.D.   Images viewed by me.   Date: 05/04/2013  Rate: 71  Rhythm: Electronic atrial paced rhythm  QRS Axis: left  Intervals: PR prolonged  ST/T Wave abnormalities: nonspecific T wave changes  Conduction Disutrbances:left anterior fascicular block  Narrative Interpretation: Electronic atrial pacemaker with first degree AV block, left anterior fascicular block, nonspecific T wave changes. When here with ECG of 02/22/2012, no significant changes are seen.  Old EKG Reviewed: unchanged    1. CHF exacerbation       MDM  Dyspnea which may be do to CHF, possibly due to bronchitis. Chest x-ray and screening labs have been ordered.  Chest x-ray is consistent with pneumonia at the left base. However, BNP is markedly elevated over baseline, he is afebrile, and WBC is normal. He was given initial dose of ceftriaxone and azithromycin but I feel that his condition is related to his congestive heart failure. He is given a dose of furosemide in the ED. Case is discussed with Dr. Waymon Amato of [facility] who agrees to admit the patient.  Dione Booze, MD 05/04/13 [phone]

## 2013-05-05 ENCOUNTER — Inpatient Hospital Stay (HOSPITAL_COMMUNITY): Payer: Medicare Other

## 2013-05-05 DIAGNOSIS — I5043 Acute on chronic combined systolic (congestive) and diastolic (congestive) heart failure: Secondary | ICD-10-CM

## 2013-05-05 DIAGNOSIS — Z8673 Personal history of transient ischemic attack (TIA), and cerebral infarction without residual deficits: Secondary | ICD-10-CM

## 2013-05-05 LAB — BASIC METABOLIC PANEL
BUN: 19 mg/dL (ref 6–23)
CO2: 28 mEq/L (ref 19–32)
Glucose, Bld: 84 mg/dL (ref 70–99)
Potassium: 3.6 mEq/L (ref 3.5–5.1)
Sodium: 146 mEq/L — ABNORMAL HIGH (ref 135–145)

## 2013-05-05 LAB — CBC
HCT: 38 % — ABNORMAL LOW (ref 39.0–52.0)
Hemoglobin: 13.2 g/dL (ref 13.0–17.0)
MCH: 32.4 pg (ref 26.0–34.0)
MCHC: 34.7 g/dL (ref 30.0–36.0)
MCV: 93.1 fL (ref 78.0–100.0)
RBC: 4.08 MIL/uL — ABNORMAL LOW (ref 4.22–5.81)

## 2013-05-05 LAB — VITAMIN B12: Vitamin B-12: 289 pg/mL (ref 211–911)

## 2013-05-05 MED ORDER — FUROSEMIDE 40 MG PO TABS
40.0000 mg | ORAL_TABLET | Freq: Every day | ORAL | Status: DC
  Administered 2013-05-06: 40 mg via ORAL
  Filled 2013-05-05: qty 1

## 2013-05-05 NOTE — Progress Notes (Signed)
[facility] PROGRESS NOTE  [name] XBJ:[id number] DOB: 08/17/1927 DOA: 05/04/2013 PCP: Delorse Lek, MD  Assessment/Plan: Acute on chronic systolic and diastolic CHF -Likely due to uncontrolled hypertension -neg 1500cc for the admission --3.4 kg for the admission -Change IV furosemide to by mouth -Monitor serial BMP -Repeat chest x-ray suggests improved interstitial edema -Troponins negative -Echocardiogram 03/29/2013 EF 35-40%, grade 1 diastolic dysfunction- Hypertension, uncontrolled -Improving with diuresis -Continue metoprolol succinate, lisinopril, amlodipine Tobacco abuse -Tobacco cessation discussed -Patient declines nicotine patch Thrombocytopenia -Has been chronic in the past -Check serum B12, RBC folate CAD/CABG/pacemaker:  -Asymptomatic of chest pain and no acute findings an EKG. History of stroke -Continue antiplatelet agents  Family Communication:   Pt at beside Disposition Plan:   Home when medically stable       Procedures/Studies: X-ray Chest Pa And Lateral   05/05/2013   *RADIOLOGY REPORT*  Clinical Data: CHF  CHEST - 2 VIEW  Comparison:   the previous day's study  Findings: Previous CABG.  Left subclavian pacemaker stable.  Coarse interstitial markings in the lung bases with improvement in the interstitial edema since previous exam.  Small pleural effusions persist.  Heart size normal.  Tortuous atheromatous aorta.  IMPRESSION:  1.  Interval improvement in the interstitial edema.   Original Report Authenticated By: D. Andria Rhein, MD   Dg Chest 2 View  05/04/2013   *RADIOLOGY REPORT*  Clinical Data: Shortness of breath  CHEST - 2 VIEW  Comparison: 05/02/2011  Findings: Patchy left lower lobe opacity, suspicious for pneumonia.  Mild patchy right basilar opacity, some of which is likely chronic, although superimposed infection is suspected.  Small right pleural effusion.  Underlying chronic interstitial markings.  Cardiomegaly. Postsurgical  changes related to prior CABG.  Left subclavian pacemaker.  IMPRESSION: Mild patchy bilateral lower lobe opacities, suspicious for pneumonia.  Small right pleural effusion.   Original Report Authenticated By: Charline Bills, M.D.         Subjective:  Patient states that he is breathing 75% better. Denies any fevers, chills, chest discomfort, nausea, vomiting, diarrhea, abdominal pain, dysuria, dizziness.  Objective: Filed Vitals:   05/04/13 1914 05/04/13 2057 05/05/13 0144 05/05/13 0617  BP: 184/94 164/68 149/90 164/68  Pulse: 76 71 74 74  Temp: 98.7 F (37.1 C) 98.6 F (37 C) 98.7 F (37.1 C) 98.3 F (36.8 C)  TempSrc: Oral Oral Oral Oral  Resp: 22 20 20 20   Height: 5\' 9"  (1.753 m)     Weight: 65.998 kg (145 lb 8 oz)   62.551 kg (137 lb 14.4 oz)  SpO2: 98% 100% 100% 98%    Intake/Output Summary (Last 24 hours) at 05/05/13 0937 Last data filed at 05/05/13 0849  Gross per 24 hour  Intake    240 ml  Output   1800 ml  Net  -1560 ml   Weight change:  Exam:   General:  Pt is alert, follows commands appropriately, not in acute distress  HEENT: No icterus, No thrush, No neck mass, [place]/AT  Cardiovascular: RRR, S1/S2, no rubs, no gallops  Respiratory: right basilar crackles. Left clear to auscultation. No wheezes. Good air movement.   Abdomen: Soft/+BS, non tender, non distended, no guarding  Extremities: trace  edema right lower extremity. , No lymphangitis, No petechiae, No rashes, no synovitis; no edema left lower extremity   Data Reviewed: Basic Metabolic Panel:  Recent Labs Lab 05/04/13 1527 05/05/13 0515  NA 139 146*  K 3.7 3.6  CL 106 109  CO2 25  28  GLUCOSE 92 84  BUN 17 19  CREATININE 1.25 1.52*  CALCIUM 8.5 8.7   Liver Function Tests: No results found for this basename: AST, ALT, ALKPHOS, BILITOT, PROT, ALBUMIN,  in the last 168 hours No results found for this basename: LIPASE, AMYLASE,  in the last 168 hours No results found for this basename:  AMMONIA,  in the last 168 hours CBC:  Recent Labs Lab 05/04/13 1527 05/05/13 0515  WBC 9.2 8.3  HGB 14.3 13.2  HCT 41.3 38.0*  MCV 93.4 93.1  PLT 121* 135*   Cardiac Enzymes:  Recent Labs Lab 05/04/13 1853 05/05/13 0058 05/05/13 0818  TROPONINI <0.30 <0.30 <0.30   BNP: No components found with this basename: POCBNP,  CBG: No results found for this basename: GLUCAP,  in the last 168 hours  No results found for this or any previous visit (from the past 240 hour(s)).   Scheduled Meds: . amLODipine  5 mg Oral Daily  . aspirin EC  81 mg Oral Daily  . clopidogrel  75 mg Oral Q breakfast  . enoxaparin (LOVENOX) injection  40 mg Subcutaneous Q24H  . isosorbide mononitrate  60 mg Oral Daily  . lisinopril  20 mg Oral Daily  . metoprolol succinate  50 mg Oral Daily  . potassium chloride SA  20 mEq Oral BID  . sodium chloride  3 mL Intravenous Q12H  . tamsulosin  0.4 mg Oral PC supper   Continuous Infusions:    Kamaljit Hizer, DO  [facility] Pager [phone]  If 7PM-7AM, please contact night-coverage [email or web address] Password [facility] 05/05/2013, 9:37 AM   LOS: 1 day

## 2013-05-06 DIAGNOSIS — E785 Hyperlipidemia, unspecified: Secondary | ICD-10-CM

## 2013-05-06 LAB — BASIC METABOLIC PANEL
BUN: 22 mg/dL (ref 6–23)
Calcium: 8.6 mg/dL (ref 8.4–10.5)
GFR calc non Af Amer: 42 mL/min — ABNORMAL LOW (ref >90)
Glucose, Bld: 87 mg/dL (ref 70–99)
Sodium: 143 mEq/L (ref 135–145)

## 2013-05-06 MED ORDER — CYANOCOBALAMIN 1000 MCG/ML IJ SOLN
1000.0000 ug | Freq: Once | INTRAMUSCULAR | Status: AC
  Administered 2013-05-06: 1000 ug via INTRAMUSCULAR
  Filled 2013-05-06 (×2): qty 1

## 2013-05-06 MED ORDER — FUROSEMIDE 40 MG PO TABS: 40.0000 mg | ORAL_TABLET | Freq: Every day | ORAL | Status: DC

## 2013-05-06 MED ORDER — VITAMIN B-12 1000 MCG PO TABS: 1000.0000 ug | ORAL_TABLET | Freq: Every day | ORAL | Status: DC

## 2013-05-06 MED ORDER — METOPROLOL SUCCINATE ER 100 MG PO TB24: 100.0000 mg | ORAL_TABLET | Freq: Every day | ORAL | Status: DC

## 2013-05-06 MED ORDER — METOPROLOL SUCCINATE ER 50 MG PO TB24
50.0000 mg | ORAL_TABLET | Freq: Once | ORAL | Status: AC
  Administered 2013-05-06: 50 mg via ORAL
  Filled 2013-05-06 (×2): qty 1

## 2013-05-06 MED ORDER — PNEUMOCOCCAL VAC POLYVALENT 25 MCG/0.5ML IJ INJ: 0.5000 mL | INJECTION | INTRAMUSCULAR | Status: DC

## 2013-05-06 MED ORDER — CYANOCOBALAMIN 1000 MCG PO TABS: 1000.0000 ug | ORAL_TABLET | Freq: Every day | ORAL | Status: DC

## 2013-05-06 NOTE — Progress Notes (Signed)
Client verbalized understanding of discharge instructions.  Reviewed heart failure education with client.  Heart failure booklet sent home with client.

## 2013-05-06 NOTE — Discharge Summary (Signed)
Physician Discharge Summary  Edu On UUV:[id number] DOB: [birth year] DOA: 05/04/2013  PCP: Delorse Lek, MD  Admit date: 05/04/2013 Discharge date: 05/06/2013  Recommendations for Outpatient Follow-up:  1. Pt will need to follow up with PCP in 1 weeks post discharge 2. Please obtain BMP to evaluate electrolytes and kidney function 3. Please also check CBC to evaluate Hg and Hct levels   Discharge Diagnoses:  Principal Problem:   Acute on chronic systolic CHF (congestive heart failure) Active Problems:   HTN (hypertension)   Hyperlipidemia   Tobacco abuse   History of CVA (cerebrovascular accident)   Thrombocytopenia   Systolic and diastolic CHF, acute on chronic Acute on chronic systolic and diastolic CHF  -Likely due to uncontrolled hypertension  -neg 3490cc for the admission  --5.8 kg for the admission  -Changed IV furosemide to by mouth  -The patient will go home on furosemide 40 mg by mouth daily -Patient was instructed to stop hydrochlorothiazide -He was instructed to followup with his primary care physician this week to recheck his electrolytes and BMP -Repeat chest x-ray suggests improved interstitial edema  -Troponins negative  -Echocardiogram 03/29/2013 EF 35-40%, grade 1 diastolic dysfunction-  Hypertension, uncontrolled  -Improving with diuresis  -Continue metoprolol succinate, lisinopril, amlodipine  -Metoprolol succinate was increased to 100 mg daily Tobacco abuse  -Tobacco cessation discussed  -Patient declines nicotine patch  Thrombocytopenia  -Has been chronic in the past  -serum B12-marginally low at 289. The patient was started on B12 supplementation 1000 mcg daily.  -RBC folate results are pending at the time of discharge CAD/CABG/pacemaker:  -Asymptomatic of chest pain and no acute findings an EKG.  History of stroke  -Continue antiplatelet agents  Family Communication: Pt at beside  Disposition Plan: Home when medically stable   Discharge  Condition: Stable  Disposition:  home  Diet: heart healthy Wt Readings from Last 3 Encounters:  05/06/13 60.1 kg (132 lb 7.9 oz)  08/28/12 63.504 kg (140 lb)  02/22/12 64.864 kg (143 lb)    History of present illness:  79 y.o. male with history of HTN, HL, CAD, CABG, MI, CVA, pacemaker presented to the ED on 05/04/13 with complaints of cough and difficulty breathing. Patient's wife recently demised. For last 3 days patient has been having mild nonproductive cough without associated fever, chills, chest pain or dyspnea. He woke up at 3 AM on 05/04/13 with gurgling in his chest associated with dyspnea. He got up and walked a little with improvement in his dyspnea. However when he returned to lay down on his bed, the dyspnea returned. She thereby got up and stayed on his recliner. He denies chest pain. He has chronic bilateral ankle edema right greater than left. No recent long-distance travel. She does not recollect history of CHF. He presented to the ED where chest x-ray was read as possible pneumonia. However his clinical picture is more suggestive of decompensated CHF. He has been given a dose of azithromycin and a hospitalist admission was requested.   The patient's antibiotics were discontinued throughout the hospitalization. The patient remained afebrile and hemodynamically stable. The patient was started on intravenous furosemide. He experienced significant improvement in his breathing. The patient stated that his breathing was back to baseline. He was transitioned to oral furosemide. He will be sent home on 40 mg by mouth daily. The patient was instructed to stop his hydrochlorothiazide. For his hypertension, the patient's metoprolol succinate was increased to 100 mg daily. The patient was instructed to followup with his primary  care provider this week for lab work. The patient diuresed 3.5 L during the hospitalization. He will continue on lisinopril, Imdur lower, and amlodipine. The patient's  serum creatinine did increase to 1.52. However, it remained stable with continued diuresis. On the day of discharge, his serum creatinine was 1.48.       Discharge Exam: Filed Vitals:   05/06/13 1015  BP: 163/92  Pulse: 72  Temp:   Resp:    Filed Vitals:   05/05/13 1300 05/05/13 2137 05/06/13 0436 05/06/13 1015  BP: 145/69 169/82 174/85 163/92  Pulse: 71 75 71 72  Temp: 98.5 F (36.9 C) 98.3 F (36.8 C) 98.1 F (36.7 C)   TempSrc: Oral Oral Oral   Resp: 20 20 20    Height:      Weight:   60.1 kg (132 lb 7.9 oz)   SpO2: 96% 92% 93%    General: A&O x 3, NAD, pleasant, cooperative Cardiovascular: RRR, no rub, no gallop, no S3 Respiratory: CTAB, no wheeze, no rhonchi Abdomen:soft, nontender, nondistended, positive bowel sounds Extremities: No edema, No lymphangitis, no petechiae  Discharge Instructions      Discharge Orders   Future Appointments Provider Department Dept Phone   09/03/2013 2:30 PM [facility] [phone]   09/03/2013 3:40 PM Evern Bio, NP [facility] [phone]   Future Orders Complete By Expires     Diet - low sodium heart healthy  As directed     Discharge instructions  As directed     Comments:      Clear primary care physician on Monday, 05/07/2013 to schedule a followup appointment    Increase activity slowly  As directed         Medication List    STOP taking these medications       hydrochlorothiazide 25 MG tablet  Commonly known as:  HYDRODIURIL     ibuprofen 600 MG tablet  Commonly known as:  ADVIL,MOTRIN      TAKE these medications       amLODipine 5 MG tablet  Commonly known as:  NORVASC  Take 5 mg by mouth daily.     aspirin 81 MG tablet  Take 81 mg by mouth daily.     B-6 PO  Take 1 tablet by mouth daily.     clopidogrel 75 MG tablet  Commonly known as:  PLAVIX  Take 75 mg by mouth daily.     cyanocobalamin 1000 MCG tablet  Take 1  tablet (1,000 mcg total) by mouth daily.  Start taking on:  05/07/2013     furosemide 40 MG tablet  Commonly known as:  LASIX  Take 1 tablet (40 mg total) by mouth daily.     isosorbide mononitrate 60 MG 24 hr tablet  Commonly known as:  IMDUR  Take 60 mg by mouth daily.     lisinopril 20 MG tablet  Commonly known as:  PRINIVIL,ZESTRIL  Take 20 mg by mouth daily.     metoprolol succinate 100 MG 24 hr tablet  Commonly known as:  TOPROL-XL  Take 1 tablet (100 mg total) by mouth daily. Take with or immediately following a meal.  Start taking on:  05/07/2013     nitroGLYCERIN 0.4 MG SL tablet  Commonly known as:  NITROSTAT  Place 0.4 mg under the tongue every 5 (five) minutes as needed. For chest pain     potassium chloride SA 20 MEQ tablet  Commonly known  as:  K-DUR,KLOR-CON  Take 20 mEq by mouth 2 (two) times daily.     rosuvastatin 20 MG tablet  Commonly known as:  CRESTOR  Take 20 mg by mouth daily. 1/2 tab daily     tamsulosin 0.4 MG Caps  Commonly known as:  FLOMAX  Take 0.4 mg by mouth daily after supper.         The results of significant diagnostics from this hospitalization (including imaging, microbiology, ancillary and laboratory) are listed below for reference.    Significant Diagnostic Studies: X-ray Chest Pa And Lateral   05/05/2013   *RADIOLOGY REPORT*  Clinical Data: CHF  CHEST - 2 VIEW  Comparison:   the previous day's study  Findings: Previous CABG.  Left subclavian pacemaker stable.  Coarse interstitial markings in the lung bases with improvement in the interstitial edema since previous exam.  Small pleural effusions persist.  Heart size normal.  Tortuous atheromatous aorta.  IMPRESSION:  1.  Interval improvement in the interstitial edema.   Original Report Authenticated By: D. Andria Rhein, MD   Dg Chest 2 View  05/04/2013   *RADIOLOGY REPORT*  Clinical Data: Shortness of breath  CHEST - 2 VIEW  Comparison: 05/02/2011  Findings: Patchy left lower lobe  opacity, suspicious for pneumonia.  Mild patchy right basilar opacity, some of which is likely chronic, although superimposed infection is suspected.  Small right pleural effusion.  Underlying chronic interstitial markings.  Cardiomegaly. Postsurgical changes related to prior CABG.  Left subclavian pacemaker.  IMPRESSION: Mild patchy bilateral lower lobe opacities, suspicious for pneumonia.  Small right pleural effusion.   Original Report Authenticated By: Charline Bills, M.D.     Microbiology: No results found for this or any previous visit (from the past 240 hour(s)).   Labs: Basic Metabolic Panel:  Recent Labs Lab 05/04/13 1527 05/05/13 0515 05/06/13 0420  NA 139 146* 143  K 3.7 3.6 3.5  CL 106 109 106  CO2 25 28 29   GLUCOSE 92 84 87  BUN 17 19 22   CREATININE 1.25 1.52* 1.48*  CALCIUM 8.5 8.7 8.6  MG  --   --  2.0   Liver Function Tests: No results found for this basename: AST, ALT, ALKPHOS, BILITOT, PROT, ALBUMIN,  in the last 168 hours No results found for this basename: LIPASE, AMYLASE,  in the last 168 hours No results found for this basename: AMMONIA,  in the last 168 hours CBC:  Recent Labs Lab 05/04/13 1527 05/05/13 0515  WBC 9.2 8.3  HGB 14.3 13.2  HCT 41.3 38.0*  MCV 93.4 93.1  PLT 121* 135*   Cardiac Enzymes:  Recent Labs Lab 05/04/13 1853 05/05/13 0058 05/05/13 0818  TROPONINI <0.30 <0.30 <0.30   BNP: No components found with this basename: POCBNP,  CBG: No results found for this basename: GLUCAP,  in the last 168 hours  Time coordinating discharge:  Greater than 30 minutes  Signed:  Bobette Leyh, DO [facility] Pager: [phone] 05/06/2013, 12:37 PM

## 2013-05-07 NOTE — Progress Notes (Signed)
Utilization Review Completed.   Tiffanyann Deroo, RN, BSN Nurse Case Manager  [phone]  

## 2013-06-06 ENCOUNTER — Other Ambulatory Visit: Payer: Self-pay

## 2013-06-06 MED ORDER — METOPROLOL SUCCINATE ER 100 MG PO TB24: 100.0000 mg | ORAL_TABLET | Freq: Every day | ORAL | Status: DC

## 2013-06-06 MED ORDER — FUROSEMIDE 40 MG PO TABS: 40.0000 mg | ORAL_TABLET | Freq: Every day | ORAL | Status: DC

## 2013-06-06 NOTE — Telephone Encounter (Signed)
Rx was sent to pharmacy electronically. 

## 2013-06-19 ENCOUNTER — Other Ambulatory Visit: Payer: Self-pay | Admitting: Family Medicine

## 2013-06-19 DIAGNOSIS — R42 Dizziness and giddiness: Secondary | ICD-10-CM

## 2013-06-20 ENCOUNTER — Other Ambulatory Visit: Payer: Medicare Other

## 2013-06-27 ENCOUNTER — Ambulatory Visit
Admission: RE | Admit: 2013-06-27 | Discharge: 2013-06-27 | Disposition: A | Discharge status: Home / Self Care | Payer: Medicare Other | Source: Ambulatory Visit | Attending: Family Medicine | Admitting: Family Medicine

## 2013-06-27 DIAGNOSIS — R42 Dizziness and giddiness: Secondary | ICD-10-CM

## 2013-07-23 LAB — PACEMAKER DEVICE OBSERVATION

## 2013-07-24 ENCOUNTER — Other Ambulatory Visit: Payer: Self-pay | Admitting: Cardiovascular Disease

## 2013-07-24 LAB — COMPREHENSIVE METABOLIC PANEL
Alkaline Phosphatase: 78 U/L (ref 39–117)
BUN: 16 mg/dL (ref 6–23)
Glucose, Bld: 108 mg/dL — ABNORMAL HIGH (ref 70–99)
Total Bilirubin: 0.8 mg/dL (ref 0.3–1.2)

## 2013-08-20 ENCOUNTER — Other Ambulatory Visit: Payer: Self-pay | Admitting: Cardiovascular Disease

## 2013-08-20 LAB — BASIC METABOLIC PANEL
BUN: 16 mg/dL (ref 6–23)
CO2: 29 mEq/L (ref 19–32)
Chloride: 105 mEq/L (ref 96–112)
Glucose, Bld: 108 mg/dL — ABNORMAL HIGH (ref 70–99)
Potassium: 3.5 mEq/L (ref 3.5–5.3)

## 2013-08-27 ENCOUNTER — Ambulatory Visit: Payer: Medicare Other | Admitting: Neurosurgery

## 2013-08-27 ENCOUNTER — Other Ambulatory Visit: Payer: Medicare Other

## 2013-08-30 ENCOUNTER — Encounter: Payer: Self-pay | Admitting: Cardiovascular Disease

## 2013-08-31 ENCOUNTER — Encounter: Payer: Self-pay | Admitting: Family

## 2013-09-03 ENCOUNTER — Other Ambulatory Visit (HOSPITAL_COMMUNITY): Payer: Medicare Other

## 2013-09-03 ENCOUNTER — Ambulatory Visit: Payer: Medicare Other | Admitting: Family

## 2013-10-29 ENCOUNTER — Encounter: Payer: Self-pay | Admitting: Internal Medicine

## 2013-10-29 ENCOUNTER — Telehealth: Payer: Self-pay | Admitting: Internal Medicine

## 2013-10-29 NOTE — Telephone Encounter (Signed)
Called pt to set up fu pacer ck with [facility]red, n/a, no voicemail, sent letter, former [employer] pt/mt

## 2013-10-31 NOTE — Telephone Encounter (Signed)
Follow up    Pt called stating he received letter to call and schedule an appt with Dr [name].  However, he has no idea when he is due to have a device check and he is also seen at the [place].  Will you pls look at his chart and let me know when he is due to be seen by Dr [name] and/or pacer check so that I can schedule him an appt.    Thanks for your help

## 2013-12-12 ENCOUNTER — Encounter: Payer: Self-pay | Admitting: Internal Medicine

## 2013-12-13 ENCOUNTER — Encounter: Payer: Self-pay | Admitting: Internal Medicine

## 2013-12-13 ENCOUNTER — Ambulatory Visit (INDEPENDENT_AMBULATORY_CARE_PROVIDER_SITE_OTHER): Payer: Medicare Other | Admitting: Internal Medicine

## 2013-12-13 VITALS — BP 190/97 | HR 76 | Ht 69.0 in | Wt 145.0 lb

## 2013-12-13 DIAGNOSIS — I495 Sick sinus syndrome: Secondary | ICD-10-CM | POA: Insufficient documentation

## 2013-12-13 DIAGNOSIS — R001 Bradycardia, unspecified: Secondary | ICD-10-CM

## 2013-12-13 DIAGNOSIS — I25810 Atherosclerosis of coronary artery bypass graft(s) without angina pectoris: Secondary | ICD-10-CM

## 2013-12-13 DIAGNOSIS — Z720 Tobacco use: Secondary | ICD-10-CM

## 2013-12-13 DIAGNOSIS — F17200 Nicotine dependence, unspecified, uncomplicated: Secondary | ICD-10-CM

## 2013-12-13 DIAGNOSIS — I498 Other specified cardiac arrhythmias: Secondary | ICD-10-CM

## 2013-12-13 DIAGNOSIS — I10 Essential (primary) hypertension: Secondary | ICD-10-CM

## 2013-12-13 DIAGNOSIS — Z950 Presence of cardiac pacemaker: Secondary | ICD-10-CM

## 2013-12-13 NOTE — Progress Notes (Signed)
Delorse Lek, MD: Primary Cardiologist: former [name] - followed at the [facility] Sansoucie is a 80 y.o. male with a h/o symptomatic sinus bradycardia and syncope sp PPM (MDT) by Dr Jenne Campus with [facility] who [facility] today to establish care in the Electrophysiology device clinic.    His past medical history is significant for coronary artery disease s/p CABG, PVD, CVA, COPD, and hypothyroidism.   He remains very active for his age and has no functional limitations.  Unfortunately, he continues to smoke and has not desire to quit. We spent time today talking about his spouse who died several years ago (after 59 years and 1 day of marriage).  He appears to live alone now.  The patient reports doing very well since having a pacemaker implanted and remains very active despite his age.   Today, he  denies symptoms of palpitations, chest pain, shortness of breath, orthopnea, PND, lower extremity edema, dizziness, presyncope, syncope, or neurologic sequela.  The patientis tolerating medications without difficulties and is otherwise without complaint today.   Past Medical History  Diagnosis Date  . Hypertension   . Hx of CABG     1st in 2000 (Dr. Barry Dienes) x 4, LIMA to LAD, SVG to diagonal, SVG to OM4, SVG to PDA. Re-cath in May 2012 with patent LIMA to LAD, patent SVG to diagonal, patent SVG to OM with left-to-right collaterals to an occluded right.  . MI (myocardial infarction)   . TIA (transient ischemic attack)   . Hyperlipidemia   . COPD (chronic obstructive pulmonary disease)   . CVA (cerebral infarction) 07/2011    Remote left brain infarct with sensory deficits and had a left internal carotid stent placed by Dr. [name] at that time, no recurrent CVA or TIAs or CNS events.  Marland Kitchen PAD (peripheral artery disease)     Stable. Had a past RFPBG by Dr. [name] in 2009. this graft is occluded with reconstitution in the popliteal artery, which was patent. Right tibial was  occluded and 2-vessel runoff on angiography done by [name] in 12/2010.  . Sick sinus syndrome     PPM implanted for this and syncope   . CHF (congestive heart failure)     Hospitalization for CHF from 05/04/13-05/06/13. Treated medically. Had possible pneumonia with short course of antibiotics.  . Hypothyroid     On supplement  . Abdominal bruit     Loud  . Left ventricular dysfunction   . AAA (abdominal aortic aneurysm)     Has a known AAA of 3.2 x 3.4 cm by abdominal untrasound 03/27/12. Followup abdominal ultrasound on 03/29/13 showed a diameter of 3.6 cm, which is stable  . Peripheral vascular disease    Past Surgical History  Procedure Laterality Date  . Femoral to akpopliteal bypass graft    . Carotid stent Left 06/23/11    By [name]  . Coronary artery bypass graft  2000    1st in 2000 (Dr. Barry Dienes) x 4, LIMA to LAD, SVG to diagonal, SVG to OM4, SVG to PDA. Re-cath in May 2012 with patent LIMA to LAD, patent SVG to diagonal, patent SVG to OM with left-to-right collaterals to an occluded right.  . Pacemaker insertion  2003    implanted 2003 by Dr Jenne Campus with gen change (MDT ADDRL1) [facility]    History   Social History  . Marital Status: Widowed    Spouse Name: N/A    Number of Children:  N/A  . Years of Education: N/A   Occupational History  . Not on file.   Social History Main Topics  . Smoking status: Current Every Day Smoker    Types: Cigarettes  . Smokeless tobacco: Former NeurosurgeonUser    Quit date: 06/02/2011  . Alcohol Use: No  . Drug Use: No  . Sexual Activity: Not on file   Other Topics Concern  . Not on file   Social History Narrative   [place] in [place].  [facility]    Family History  Problem Relation Age of Onset  . Cancer Brother 60    oral cawncer  . Cancer Brother     prostate ca  . Hypertension Brother   . Hyperlipidemia Brother     Allergies  Allergen Reactions  . Pravachol     myalgias  .  Simvastatin     myalgia    Current Outpatient Prescriptions  Medication Sig Dispense Refill  . amLODipine (NORVASC) 5 MG tablet Take 5 mg by mouth daily.        Marland Kitchen. aspirin 81 MG tablet Take 81 mg by mouth daily.        Marland Kitchen. atorvastatin (LIPITOR) 20 MG tablet Take 20 mg by mouth daily.      . clopidogrel (PLAVIX) 75 MG tablet Take 75 mg by mouth daily.        . furosemide (LASIX) 40 MG tablet Take 1 tablet (40 mg total) by mouth daily.  30 tablet  9  . isosorbide mononitrate (IMDUR) 60 MG 24 hr tablet Take 60 mg by mouth daily.        Marland Kitchen. lisinopril (PRINIVIL,ZESTRIL) 20 MG tablet Take 20 mg by mouth daily.        . metoprolol succinate (TOPROL-XL) 100 MG 24 hr tablet Take 50 mg by mouth 2 (two) times daily. Take with or immediately following a meal.      . nitroGLYCERIN (NITROSTAT) 0.4 MG SL tablet [name] 0.4 mg under the tongue every 5 (five) minutes as needed. For chest pain      . potassium chloride SA (K-DUR,KLOR-CON) 20 MEQ tablet Take 20 mEq by mouth 2 (two) times daily.        . Pyridoxine HCl (B-6 PO) Take 1 tablet by mouth daily.      . Tamsulosin HCl (FLOMAX) 0.4 MG CAPS Take 0.4 mg by mouth daily after supper.        . vitamin B-12 1000 MCG tablet Take 1 tablet (1,000 mcg total) by mouth daily.  30 tablet  0   No current facility-administered medications for this visit.    ROS- all systems are reviewed and negative except as per HPI  Physical Exam: Filed Vitals:   12/13/13 1213  BP: 190/97  Pulse: 76  Height: 5\' 9"  (1.753 m)  Weight: 145 lb (65.772 kg)    GEN- The patient is well appearing, alert and oriented x 3 today.   Head- normocephalic, atraumatic Eyes-  Sclera clear, conjunctiva pink Ears- hearing intact Oropharynx- clear Neck- supple  Lungs- Clear to ausculation bilaterally, normal work of breathing Chest- pacemaker pocket is well healed Heart- Regular rate and rhythm  GI- soft, NT, ND, + BS Extremities- no clubbing, cyanosis, or edema MS- no significant  deformity or atrophy Skin- no rash or lesion Psych- euthymic mood, full affect Neuro- strength and sensation are intact  Pacemaker interrogation- reviewed in detail today,  See PACEART report Dr Kandis CockingWeintraub's records are  reviewed today  Assessment and Plan:  1.  Symptomatic bradycardia/sick sinus syndrome Normal pacemaker function See Pace Art report MVP programmed on today to minimize V pacing Will follow with [facility]  2.  CAD No ischemic symptoms Continue current medical therapy He says that he wants his primary cardiologist to remain with the A [name].  I am not clear that he has very close follow-up. I will have him see Lawson Fiscal in 6 months  3. HTN Well above goal today.  He is clear that his BP is controlled at home.  He says he checks it frequently.  He says that due to battery failure, his smoke detector went off this am and that's why his BP is elevated today.  He declines medicine change.  I would recommend increasing norvasc upon return if BP is still elevated.  4. Tobacco Cessation strongly advised He is not ready to quit  [facility] Return to see Lawson Fiscal in 6 months I will see in a year

## 2013-12-13 NOTE — Patient Instructions (Addendum)
   Your physician wants you to follow-up in: 6 months with Sunday SpillersLori Gerhardt,NP and 1 year with Dr Johney FrameAllred. You will receive a reminder letter in the mail two months in advance. If you don't receive a letter, please call our office to schedule the follow-up appointment.

## 2013-12-27 ENCOUNTER — Ambulatory Visit (INDEPENDENT_AMBULATORY_CARE_PROVIDER_SITE_OTHER): Payer: Medicare Other | Admitting: General Surgery

## 2013-12-30 ENCOUNTER — Inpatient Hospital Stay (HOSPITAL_COMMUNITY): Payer: Non-veteran care

## 2013-12-30 ENCOUNTER — Inpatient Hospital Stay (HOSPITAL_COMMUNITY)
Admission: EM | Admit: 2013-12-30 | Discharge: 2014-01-05 | DRG: 280 | Disposition: A | Discharge status: Home Health | Payer: Non-veteran care | Attending: Internal Medicine | Admitting: Internal Medicine

## 2013-12-30 ENCOUNTER — Encounter (HOSPITAL_COMMUNITY): Payer: Self-pay | Admitting: Emergency Medicine

## 2013-12-30 ENCOUNTER — Emergency Department (HOSPITAL_COMMUNITY): Payer: Non-veteran care

## 2013-12-30 DIAGNOSIS — J441 Chronic obstructive pulmonary disease with (acute) exacerbation: Secondary | ICD-10-CM

## 2013-12-30 DIAGNOSIS — I5043 Acute on chronic combined systolic (congestive) and diastolic (congestive) heart failure: Secondary | ICD-10-CM

## 2013-12-30 DIAGNOSIS — E440 Moderate protein-calorie malnutrition: Secondary | ICD-10-CM

## 2013-12-30 DIAGNOSIS — Z79899 Other long term (current) drug therapy: Secondary | ICD-10-CM

## 2013-12-30 DIAGNOSIS — I714 Abdominal aortic aneurysm, without rupture, unspecified: Secondary | ICD-10-CM | POA: Diagnosis present

## 2013-12-30 DIAGNOSIS — I739 Peripheral vascular disease, unspecified: Secondary | ICD-10-CM | POA: Diagnosis present

## 2013-12-30 DIAGNOSIS — I495 Sick sinus syndrome: Secondary | ICD-10-CM

## 2013-12-30 DIAGNOSIS — N189 Chronic kidney disease, unspecified: Secondary | ICD-10-CM | POA: Diagnosis present

## 2013-12-30 DIAGNOSIS — I214 Non-ST elevation (NSTEMI) myocardial infarction: Secondary | ICD-10-CM

## 2013-12-30 DIAGNOSIS — N4889 Other specified disorders of penis: Secondary | ICD-10-CM | POA: Diagnosis present

## 2013-12-30 DIAGNOSIS — I2789 Other specified pulmonary heart diseases: Secondary | ICD-10-CM | POA: Diagnosis present

## 2013-12-30 DIAGNOSIS — G9340 Encephalopathy, unspecified: Secondary | ICD-10-CM | POA: Diagnosis present

## 2013-12-30 DIAGNOSIS — I10 Essential (primary) hypertension: Secondary | ICD-10-CM

## 2013-12-30 DIAGNOSIS — IMO0002 Reserved for concepts with insufficient information to code with codable children: Secondary | ICD-10-CM

## 2013-12-30 DIAGNOSIS — Z950 Presence of cardiac pacemaker: Secondary | ICD-10-CM

## 2013-12-30 DIAGNOSIS — I5042 Chronic combined systolic (congestive) and diastolic (congestive) heart failure: Secondary | ICD-10-CM | POA: Diagnosis present

## 2013-12-30 DIAGNOSIS — E039 Hypothyroidism, unspecified: Secondary | ICD-10-CM | POA: Diagnosis present

## 2013-12-30 DIAGNOSIS — I5023 Acute on chronic systolic (congestive) heart failure: Secondary | ICD-10-CM

## 2013-12-30 DIAGNOSIS — Z7982 Long term (current) use of aspirin: Secondary | ICD-10-CM

## 2013-12-30 DIAGNOSIS — N179 Acute kidney failure, unspecified: Secondary | ICD-10-CM

## 2013-12-30 DIAGNOSIS — D72829 Elevated white blood cell count, unspecified: Secondary | ICD-10-CM

## 2013-12-30 DIAGNOSIS — J209 Acute bronchitis, unspecified: Secondary | ICD-10-CM

## 2013-12-30 DIAGNOSIS — D696 Thrombocytopenia, unspecified: Secondary | ICD-10-CM

## 2013-12-30 DIAGNOSIS — J440 Chronic obstructive pulmonary disease with acute lower respiratory infection: Secondary | ICD-10-CM | POA: Diagnosis present

## 2013-12-30 DIAGNOSIS — Z8673 Personal history of transient ischemic attack (TIA), and cerebral infarction without residual deficits: Secondary | ICD-10-CM

## 2013-12-30 DIAGNOSIS — Z8042 Family history of malignant neoplasm of prostate: Secondary | ICD-10-CM

## 2013-12-30 DIAGNOSIS — Z8249 Family history of ischemic heart disease and other diseases of the circulatory system: Secondary | ICD-10-CM

## 2013-12-30 DIAGNOSIS — Z720 Tobacco use: Secondary | ICD-10-CM

## 2013-12-30 DIAGNOSIS — I25810 Atherosclerosis of coronary artery bypass graft(s) without angina pectoris: Secondary | ICD-10-CM

## 2013-12-30 DIAGNOSIS — I5022 Chronic systolic (congestive) heart failure: Secondary | ICD-10-CM

## 2013-12-30 DIAGNOSIS — E785 Hyperlipidemia, unspecified: Secondary | ICD-10-CM

## 2013-12-30 DIAGNOSIS — I6529 Occlusion and stenosis of unspecified carotid artery: Secondary | ICD-10-CM

## 2013-12-30 DIAGNOSIS — I509 Heart failure, unspecified: Secondary | ICD-10-CM

## 2013-12-30 DIAGNOSIS — I129 Hypertensive chronic kidney disease with stage 1 through stage 4 chronic kidney disease, or unspecified chronic kidney disease: Secondary | ICD-10-CM | POA: Diagnosis present

## 2013-12-30 DIAGNOSIS — J111 Influenza due to unidentified influenza virus with other respiratory manifestations: Secondary | ICD-10-CM

## 2013-12-30 DIAGNOSIS — I252 Old myocardial infarction: Secondary | ICD-10-CM

## 2013-12-30 DIAGNOSIS — F17200 Nicotine dependence, unspecified, uncomplicated: Secondary | ICD-10-CM | POA: Diagnosis present

## 2013-12-30 DIAGNOSIS — D649 Anemia, unspecified: Secondary | ICD-10-CM | POA: Diagnosis present

## 2013-12-30 LAB — CBC WITH DIFFERENTIAL/PLATELET
BASOS ABS: 0 10*3/uL (ref 0.0–0.1)
Basophils Relative: 0 % (ref 0–1)
EOS ABS: 0 10*3/uL (ref 0.0–0.7)
Eosinophils Relative: 0 % (ref 0–5)
HCT: 45.8 % (ref 39.0–52.0)
Hemoglobin: 16.2 g/dL (ref 13.0–17.0)
LYMPHS ABS: 2.1 10*3/uL (ref 0.7–4.0)
LYMPHS PCT: 13 % (ref 12–46)
MCH: 32.9 pg (ref 26.0–34.0)
MCHC: 35.4 g/dL (ref 30.0–36.0)
MCV: 93.1 fL (ref 78.0–100.0)
MONOS PCT: 5 % (ref 3–12)
Monocytes Absolute: 0.8 10*3/uL (ref 0.1–1.0)
NEUTROS ABS: 13.1 10*3/uL — AB (ref 1.7–7.7)
Neutrophils Relative %: 82 % — ABNORMAL HIGH (ref 43–77)
PLATELETS: 145 10*3/uL — AB (ref 150–400)
RBC: 4.92 MIL/uL (ref 4.22–5.81)
RDW: 14.9 % (ref 11.5–15.5)
WBC MORPHOLOGY: INCREASED
WBC: 16 10*3/uL — AB (ref 4.0–10.5)

## 2013-12-30 LAB — PRO B NATRIURETIC PEPTIDE: PRO B NATRI PEPTIDE: 16965 pg/mL — AB (ref 0–450)

## 2013-12-30 LAB — BASIC METABOLIC PANEL
BUN: 22 mg/dL (ref 6–23)
CHLORIDE: 98 meq/L (ref 96–112)
CO2: 25 mEq/L (ref 19–32)
CREATININE: 1.55 mg/dL — AB (ref 0.50–1.35)
Calcium: 8.8 mg/dL (ref 8.4–10.5)
GFR, EST AFRICAN AMERICAN: 46 mL/min — AB (ref >90)
GFR, EST NON AFRICAN AMERICAN: 39 mL/min — AB (ref >90)
Glucose, Bld: 144 mg/dL — ABNORMAL HIGH (ref 70–99)
POTASSIUM: 4.3 meq/L (ref 3.7–5.3)
Sodium: 141 mEq/L (ref 137–147)

## 2013-12-30 LAB — PROTIME-INR
INR: 1.19 (ref 0.00–1.49)
Prothrombin Time: 14.8 seconds (ref 11.6–15.2)

## 2013-12-30 LAB — INFLUENZA PANEL BY PCR (TYPE A & B)
H1N1 flu by pcr: NOT DETECTED
INFLAPCR: POSITIVE — AB
Influenza B By PCR: NEGATIVE

## 2013-12-30 LAB — CK TOTAL AND CKMB (NOT AT ARMC)
CK, MB: 7.6 ng/mL — AB (ref 0.3–4.0)
Relative Index: 0.3 (ref 0.0–2.5)
Total CK: 2311 U/L — ABNORMAL HIGH (ref 7–232)

## 2013-12-30 LAB — TROPONIN I
TROPONIN I: 3.31 ng/mL — AB (ref <0.30)
TROPONIN I: 4.18 ng/mL — AB (ref <0.30)

## 2013-12-30 LAB — APTT: aPTT: 36 seconds (ref 24–37)

## 2013-12-30 MED ORDER — METHYLPREDNISOLONE SODIUM SUCC 125 MG IJ SOLR
125.0000 mg | Freq: Once | INTRAMUSCULAR | Status: AC
  Administered 2013-12-30: 125 mg via INTRAVENOUS
  Filled 2013-12-30: qty 2

## 2013-12-30 MED ORDER — SODIUM CHLORIDE 0.9 % IJ SOLN: 3.0000 mL | INTRAMUSCULAR | Status: DC | PRN

## 2013-12-30 MED ORDER — SODIUM CHLORIDE 0.9 % IJ SOLN
3.0000 mL | Freq: Two times a day (BID) | INTRAMUSCULAR | Status: DC
  Administered 2013-12-30 – 2014-01-01 (×2): 3 mL via INTRAVENOUS

## 2013-12-30 MED ORDER — TAMSULOSIN HCL 0.4 MG PO CAPS
0.4000 mg | ORAL_CAPSULE | Freq: Every day | ORAL | Status: DC
  Administered 2013-12-30 – 2014-01-04 (×6): 0.4 mg via ORAL
  Filled 2013-12-30 (×7): qty 1

## 2013-12-30 MED ORDER — ATORVASTATIN CALCIUM 20 MG PO TABS
20.0000 mg | ORAL_TABLET | Freq: Every day | ORAL | Status: DC
  Administered 2013-12-30 – 2014-01-05 (×7): 20 mg via ORAL
  Filled 2013-12-30 (×7): qty 1

## 2013-12-30 MED ORDER — ENOXAPARIN SODIUM 60 MG/0.6ML ~~LOC~~ SOLN
60.0000 mg | Freq: Two times a day (BID) | SUBCUTANEOUS | Status: DC
  Filled 2013-12-30 (×2): qty 0.6

## 2013-12-30 MED ORDER — SODIUM CHLORIDE 0.9 % IV SOLN: 250.0000 mL | INTRAVENOUS | Status: DC | PRN

## 2013-12-30 MED ORDER — ALBUTEROL SULFATE (2.5 MG/3ML) 0.083% IN NEBU
5.0000 mg | INHALATION_SOLUTION | Freq: Once | RESPIRATORY_TRACT | Status: AC
  Administered 2013-12-30: 5 mg via RESPIRATORY_TRACT
  Filled 2013-12-30: qty 6

## 2013-12-30 MED ORDER — AMLODIPINE BESYLATE 5 MG PO TABS: 5.0000 mg | ORAL_TABLET | Freq: Every day | ORAL | Status: DC

## 2013-12-30 MED ORDER — ASPIRIN 81 MG PO TABS: 81.0000 mg | ORAL_TABLET | Freq: Every day | ORAL | Status: DC

## 2013-12-30 MED ORDER — ONDANSETRON HCL 4 MG PO TABS: 4.0000 mg | ORAL_TABLET | Freq: Four times a day (QID) | ORAL | Status: DC | PRN

## 2013-12-30 MED ORDER — LISINOPRIL 20 MG PO TABS
20.0000 mg | ORAL_TABLET | Freq: Every day | ORAL | Status: DC
  Administered 2013-12-31: 20 mg via ORAL
  Filled 2013-12-30 (×2): qty 1

## 2013-12-30 MED ORDER — METHYLPREDNISOLONE SODIUM SUCC 125 MG IJ SOLR
60.0000 mg | Freq: Four times a day (QID) | INTRAMUSCULAR | Status: DC
  Administered 2013-12-30 – 2013-12-31 (×4): 60 mg via INTRAVENOUS
  Filled 2013-12-30 (×7): qty 0.96

## 2013-12-30 MED ORDER — ISOSORBIDE MONONITRATE ER 60 MG PO TB24: 60.0000 mg | ORAL_TABLET | Freq: Every day | ORAL | Status: DC

## 2013-12-30 MED ORDER — AMLODIPINE BESYLATE 10 MG PO TABS
10.0000 mg | ORAL_TABLET | Freq: Every day | ORAL | Status: DC
  Administered 2013-12-30 – 2014-01-01 (×3): 10 mg via ORAL
  Filled 2013-12-30 (×4): qty 1

## 2013-12-30 MED ORDER — GUAIFENESIN-DM 100-10 MG/5ML PO SYRP
5.0000 mL | ORAL_SOLUTION | ORAL | Status: DC | PRN
  Administered 2013-12-30: 5 mL via ORAL
  Filled 2013-12-30: qty 10

## 2013-12-30 MED ORDER — ALBUTEROL SULFATE (2.5 MG/3ML) 0.083% IN NEBU: 2.5000 mg | INHALATION_SOLUTION | RESPIRATORY_TRACT | Status: DC | PRN

## 2013-12-30 MED ORDER — IPRATROPIUM BROMIDE 0.02 % IN SOLN
0.5000 mg | Freq: Once | RESPIRATORY_TRACT | Status: AC
  Administered 2013-12-30: 0.5 mg via RESPIRATORY_TRACT
  Filled 2013-12-30: qty 2.5

## 2013-12-30 MED ORDER — ONDANSETRON HCL 4 MG/2ML IJ SOLN: 4.0000 mg | Freq: Four times a day (QID) | INTRAMUSCULAR | Status: DC | PRN

## 2013-12-30 MED ORDER — FUROSEMIDE 10 MG/ML IJ SOLN
40.0000 mg | Freq: Once | INTRAMUSCULAR | Status: AC
  Administered 2013-12-30: 40 mg via INTRAVENOUS
  Filled 2013-12-30: qty 4

## 2013-12-30 MED ORDER — SODIUM CHLORIDE 0.9 % IV SOLN
Freq: Once | INTRAVENOUS | Status: AC
  Administered 2013-12-30: 20 mL/h via INTRAVENOUS

## 2013-12-30 MED ORDER — OSELTAMIVIR PHOSPHATE 75 MG PO CAPS: 75.0000 mg | ORAL_CAPSULE | Freq: Two times a day (BID) | ORAL | Status: DC

## 2013-12-30 MED ORDER — CLOPIDOGREL BISULFATE 75 MG PO TABS
75.0000 mg | ORAL_TABLET | Freq: Every day | ORAL | Status: DC
  Administered 2013-12-30 – 2014-01-05 (×7): 75 mg via ORAL
  Filled 2013-12-30 (×7): qty 1

## 2013-12-30 MED ORDER — ISOSORBIDE MONONITRATE ER 60 MG PO TB24
60.0000 mg | ORAL_TABLET | Freq: Every day | ORAL | Status: DC
  Administered 2013-12-30 – 2014-01-05 (×7): 60 mg via ORAL
  Filled 2013-12-30 (×7): qty 1

## 2013-12-30 MED ORDER — FUROSEMIDE 40 MG PO TABS: 40.0000 mg | ORAL_TABLET | Freq: Every day | ORAL | Status: DC

## 2013-12-30 MED ORDER — LEVOFLOXACIN IN D5W 500 MG/100ML IV SOLN
500.0000 mg | INTRAVENOUS | Status: DC
  Filled 2013-12-30: qty 100

## 2013-12-30 MED ORDER — HEPARIN (PORCINE) IN NACL 100-0.45 UNIT/ML-% IJ SOLN
1000.0000 [IU]/h | INTRAMUSCULAR | Status: DC
  Administered 2013-12-30 – 2013-12-31 (×2): 1000 [IU]/h via INTRAVENOUS
  Filled 2013-12-30 (×3): qty 250

## 2013-12-30 MED ORDER — IPRATROPIUM BROMIDE 0.02 % IN SOLN
0.5000 mg | Freq: Four times a day (QID) | RESPIRATORY_TRACT | Status: DC
  Administered 2013-12-31 (×4): 0.5 mg via RESPIRATORY_TRACT
  Filled 2013-12-30 (×4): qty 2.5

## 2013-12-30 MED ORDER — HEPARIN BOLUS VIA INFUSION
3000.0000 [IU] | Freq: Once | INTRAVENOUS | Status: AC
Start: 2013-12-30 — End: 2013-12-30
  Administered 2013-12-30: 3000 [IU] via INTRAVENOUS
  Filled 2013-12-30: qty 3000

## 2013-12-30 MED ORDER — ALBUTEROL SULFATE (2.5 MG/3ML) 0.083% IN NEBU
2.5000 mg | INHALATION_SOLUTION | Freq: Four times a day (QID) | RESPIRATORY_TRACT | Status: DC
Start: 2013-12-30 — End: 2013-12-31
  Administered 2013-12-31 (×4): 2.5 mg via RESPIRATORY_TRACT
  Filled 2013-12-30 (×4): qty 3

## 2013-12-30 MED ORDER — METOPROLOL SUCCINATE ER 100 MG PO TB24
100.0000 mg | ORAL_TABLET | Freq: Every day | ORAL | Status: DC
  Administered 2013-12-30 – 2014-01-05 (×7): 100 mg via ORAL
  Filled 2013-12-30 (×7): qty 1

## 2013-12-30 MED ORDER — NICOTINE 14 MG/24HR TD PT24
14.0000 mg | MEDICATED_PATCH | Freq: Every day | TRANSDERMAL | Status: DC
  Administered 2013-12-30 – 2014-01-04 (×6): 14 mg via TRANSDERMAL
  Filled 2013-12-30 (×7): qty 1

## 2013-12-30 MED ORDER — OSELTAMIVIR PHOSPHATE 75 MG PO CAPS
75.0000 mg | ORAL_CAPSULE | Freq: Every day | ORAL | Status: DC
  Administered 2013-12-30 – 2014-01-01 (×3): 75 mg via ORAL
  Filled 2013-12-30 (×4): qty 1

## 2013-12-30 MED ORDER — ACETAMINOPHEN 325 MG PO TABS
650.0000 mg | ORAL_TABLET | Freq: Four times a day (QID) | ORAL | Status: DC | PRN
  Administered 2014-01-04: 650 mg via ORAL
  Filled 2013-12-30 (×2): qty 2

## 2013-12-30 MED ORDER — GUAIFENESIN ER 600 MG PO TB12
600.0000 mg | ORAL_TABLET | Freq: Two times a day (BID) | ORAL | Status: DC
  Administered 2013-12-31 – 2014-01-05 (×12): 600 mg via ORAL
  Filled 2013-12-30 (×13): qty 1

## 2013-12-30 MED ORDER — LEVOFLOXACIN IN D5W 750 MG/150ML IV SOLN
750.0000 mg | Freq: Once | INTRAVENOUS | Status: AC
  Administered 2013-12-30: 750 mg via INTRAVENOUS
  Filled 2013-12-30: qty 150

## 2013-12-30 MED ORDER — ASPIRIN 325 MG PO TABS
325.0000 mg | ORAL_TABLET | Freq: Every day | ORAL | Status: DC
  Administered 2013-12-30 – 2014-01-05 (×7): 325 mg via ORAL
  Filled 2013-12-30 (×7): qty 1

## 2013-12-30 MED ORDER — SODIUM CHLORIDE 0.9 % IJ SOLN
3.0000 mL | Freq: Two times a day (BID) | INTRAMUSCULAR | Status: DC
  Administered 2014-01-04: 3 mL via INTRAVENOUS

## 2013-12-30 MED ORDER — ACETAMINOPHEN 650 MG RE SUPP: 650.0000 mg | Freq: Four times a day (QID) | RECTAL | Status: DC | PRN

## 2013-12-30 MED ORDER — METOPROLOL SUCCINATE ER 50 MG PO TB24: 50.0000 mg | ORAL_TABLET | Freq: Two times a day (BID) | ORAL | Status: DC

## 2013-12-30 NOTE — ED Provider Notes (Signed)
CSN: [id number]     Arrival date & time 12/30/13  1031 History   First MD Initiated Contact with Patient 12/30/13 1038     Chief Complaint  Patient presents with  . Cough  . Generalized Body Aches   (Consider location/radiation/quality/duration/timing/severity/associated sxs/prior Treatment) Patient is a 80 y.o. male presenting with cough.  Cough Cough characteristics:  Non-productive Severity:  Moderate Onset quality:  Gradual Duration: several days. Timing:  Constant Progression:  Worsening Chronicity:  New Smoker: yes   Relieved by:  Nothing Worsened by:  Activity Associated symptoms: shortness of breath   Associated symptoms: no chest pain and no fever     Past Medical History  Diagnosis Date  . Hypertension   . Hx of CABG     1st in 2000 (Dr. Barry Dienes) x 4, LIMA to LAD, SVG to diagonal, SVG to OM4, SVG to PDA. Re-cath in May 2012 with patent LIMA to LAD, patent SVG to diagonal, patent SVG to OM with left-to-right collaterals to an occluded right.  . MI (myocardial infarction)   . TIA (transient ischemic attack)   . Hyperlipidemia   . COPD (chronic obstructive pulmonary disease)   . CVA (cerebral infarction) 07/2011    Remote left brain infarct with sensory deficits and had a left internal carotid stent placed by Dr. [name] at that time, no recurrent CVA or TIAs or CNS events.  Marland Kitchen PAD (peripheral artery disease)     Stable. Had a past RFPBG by Dr. [name] in 2009. this graft is occluded with reconstitution in the popliteal artery, which was patent. Right tibial was occluded and 2-vessel runoff on angiography done by [name] in 12/2010.  . Sick sinus syndrome     PPM implanted for this and syncope   . CHF (congestive heart failure)     Hospitalization for CHF from 05/04/13-05/06/13. Treated medically. Had possible pneumonia with short course of antibiotics.  . Hypothyroid     On supplement  . Abdominal bruit     Loud  . Left ventricular dysfunction   . AAA (abdominal  aortic aneurysm)     Has a known AAA of 3.2 x 3.4 cm by abdominal untrasound 03/27/12. Followup abdominal ultrasound on 03/29/13 showed a diameter of 3.6 cm, which is stable  . Peripheral vascular disease    Past Surgical History  Procedure Laterality Date  . Femoral to akpopliteal bypass graft    . Carotid stent Left 06/23/11    By [name]  . Coronary artery bypass graft  2000    1st in 2000 (Dr. Barry Dienes) x 4, LIMA to LAD, SVG to diagonal, SVG to OM4, SVG to PDA. Re-cath in May 2012 with patent LIMA to LAD, patent SVG to diagonal, patent SVG to OM with left-to-right collaterals to an occluded right.  . Pacemaker insertion  2003    implanted 2003 by Dr Jenne Campus with gen change (MDT ADDRL1) [facility]   Family History  Problem Relation Age of Onset  . Cancer Brother 60    oral cawncer  . Cancer Brother     prostate ca  . Hypertension Brother   . Hyperlipidemia Brother    History  Substance Use Topics  . Smoking status: Current Every Day Smoker    Types: Cigarettes  . Smokeless tobacco: Former Neurosurgeon    Quit date: 06/02/2011  . Alcohol Use: No    Review of Systems  Constitutional: Negative for fever.  HENT: Negative for congestion.   Respiratory: Positive for cough  and shortness of breath.   Cardiovascular: Negative for chest pain.  Gastrointestinal: Negative for nausea, vomiting, abdominal pain and diarrhea.  All other systems reviewed and are negative.    Allergies  Pravachol and Simvastatin  Home Medications   Current Outpatient Rx  Name  Route  Sig  Dispense  Refill  . amLODipine (NORVASC) 5 MG tablet   Oral   Take 5 mg by mouth daily.           Marland Kitchen aspirin 81 MG tablet   Oral   Take 81 mg by mouth daily.           Marland Kitchen atorvastatin (LIPITOR) 20 MG tablet   Oral   Take 20 mg by mouth daily.         . clopidogrel (PLAVIX) 75 MG tablet   Oral   Take 75 mg by mouth daily.           . furosemide (LASIX) 40 MG tablet   Oral   Take 1 tablet (40 mg  total) by mouth daily.   30 tablet   9   . isosorbide mononitrate (IMDUR) 60 MG 24 hr tablet   Oral   Take 60 mg by mouth daily.           Marland Kitchen lisinopril (PRINIVIL,ZESTRIL) 20 MG tablet   Oral   Take 20 mg by mouth daily.           . metoprolol succinate (TOPROL-XL) 100 MG 24 hr tablet   Oral   Take 50 mg by mouth 2 (two) times daily. Take with or immediately following a meal.         . nitroGLYCERIN (NITROSTAT) 0.4 MG SL tablet   Sublingual   Place 0.4 mg under the tongue every 5 (five) minutes as needed. For chest pain         . potassium chloride SA (K-DUR,KLOR-CON) 20 MEQ tablet   Oral   Take 20 mEq by mouth 2 (two) times daily.           . Pyridoxine HCl (B-6 PO)   Oral   Take 1 tablet by mouth daily.         . Tamsulosin HCl (FLOMAX) 0.4 MG CAPS   Oral   Take 0.4 mg by mouth daily after supper.           . vitamin B-12 1000 MCG tablet   Oral   Take 1 tablet (1,000 mcg total) by mouth daily.   30 tablet   0    BP 157/71  Pulse 76  Temp(Src) 98.4 F (36.9 C) (Oral)  Resp 20  SpO2 96% Physical Exam  Nursing note and vitals reviewed. Constitutional: He is oriented to person, place, and time. He appears well-developed and well-nourished. No distress.  HENT:  Head: Normocephalic and atraumatic.  Mouth/Throat: Oropharynx is clear and moist.  Eyes: Conjunctivae are normal. Pupils are equal, round, and reactive to light. No scleral icterus.  Neck: Neck supple.  Cardiovascular: Normal rate, regular rhythm, normal heart sounds and intact distal pulses.   No murmur heard. Pulmonary/Chest: No stridor. Tachypnea noted. No respiratory distress. He has decreased breath sounds (poor air movement throughout). He has no wheezes. He has no rales.  Abdominal: Soft. He exhibits no distension. There is no tenderness.  Musculoskeletal: Normal range of motion. He exhibits no edema.  Neurological: He is alert and oriented to person, place, and time.  Skin: Skin is  warm and dry. No rash noted.  Psychiatric: He has a normal mood and affect. His behavior is normal.    ED Course  Procedures (including critical care time) Labs Review Labs Reviewed  CBC WITH DIFFERENTIAL - Abnormal; Notable for the following:    WBC 16.0 (*)    Platelets 145 (*)    Neutrophils Relative % 82 (*)    Neutro Abs 13.1 (*)    All other components within normal limits  BASIC METABOLIC PANEL - Abnormal; Notable for the following:    Glucose, Bld 144 (*)    Creatinine, Ser 1.55 (*)    GFR calc non Af Amer 39 (*)    GFR calc Af Amer 46 (*)    All other components within normal limits  PRO B NATRIURETIC PEPTIDE - Abnormal; Notable for the following:    Pro B Natriuretic peptide (BNP) 16965.0 (*)    All other components within normal limits  TROPONIN I - Abnormal; Notable for the following:    Troponin I 3.31 (*)    All other components within normal limits  INFLUENZA PANEL BY PCR (TYPE A & B, H1N1)  CK TOTAL AND CKMB   Imaging Review Dg Chest 2 View  12/30/2013   CLINICAL DATA:  Productive cough.  Short of breath.  EXAM: CHEST  2 VIEW  COMPARISON:  DG CHEST 2 VIEW dated 05/05/2013  FINDINGS: Dual lead left subclavian pacemaker device and lead stable and intact. Postoperative changes in the mediastinum. Tortuous aorta.  Chronic interstitial changes. Retrocardiac left lower lobe airspace disease persists. No pneumothorax. Volume loss at the right lung base.  IMPRESSION: Above findings likely represent chronic changes. No definite active cardiopulmonary disease.   Electronically Signed   By: Maryclare BeanArt  Hoss M.D.   On: 12/30/2013 12:41   Above radiology studies independently viewed by me.     EKG Interpretation    Date/Time:  Sunday December 30 2013 11:35:01 EST Ventricular Rate:  73 PR Interval:  234 QRS Duration: 118 QT Interval:  495 QTC Calculation: 545 R Axis:   -48 Text Interpretation:  Atrial-paced rhythm with  occasional sinus beat Atrial premature complex Prolonged PR  interval Left atrial enlargement Incomplete RBBB and LAFB LVH with secondary repolarization abnormality No significant change was found Confirmed by [facility]  MD, TREY ([id number]) on 12/30/2013 11:53:37 AM            MDM   1. Acute bronchitis   2. Tobacco abuse   3. Chronic systolic CHF (congestive heart failure)   4. CAD (coronary artery disease) of artery bypass graft    80 yo male with cough and SOB.  O2 sat's 89-92% on RA.  Slightly increased WOB.  Poor air movement.  COPD listed in pt's medical history, but he denies this.  He denies using inhalers at home.  Will check labs, CXR, and give breathing treatments.    Breathing improved slightly with breathing treatment, but still hypoxic off oxygen.  Plan admission for apparent COPD exacerbation.  Family reports he had some lower extremity swelling several days ago which resolved with lasix.  (he has no edema now).  As such, will check BNP.   4:17 PM Please see Dr. Chancy MilroyKrishnan's H&P regarding elevated BNP and troponin.  Pt denied CP to me as well.  He also denies SOB, but family sent him to ED because they could tell he was breathing fast.  Candyce ChurnJohn David Juanluis Guastella, MD 12/30/13 1620

## 2013-12-30 NOTE — Progress Notes (Signed)
CRITICAL VALUE ALERT  Critical value received:  MB 7.6  Date of notification:  12/30/13  Time of notification:  1803  Critical value read back:yes  Nurse who received alert:  b Suki Crockett  MD notified (1st page): Tobias AlexanderKatarina Nelson verbally told, Barnie DelG Krishnan paged  Time of first page:  1804  MD notified (2nd page):  Time of second page:  Responding MD:  Eloy EndK Nelson  Time MD responded:  [phone]

## 2013-12-30 NOTE — H&P (Addendum)
[facility] History and Physical  [name] DUK:[id number] DOB: 1927-11-11 DOA: 12/30/2013   PCP: Stephens Shire, MD Also at [facility] Specialists: He is followed by Dr. Rayann Heman, with Cardiology  Chief Complaint: Cough, low-grade fever, confusion since yesterday  HPI: [name] is a 80 y.o. male with a past medical history of coronary artery disease, status post CABG, hypertension, chronic systolic CHF with EF of about 35% based on Echocardiogram in March of 2014, tobacco abuse, who was brought in today by his family because he was found to be lying on the floor. Patient was also confused and disoriented. He didn't know where he was and he couldn't recognize his friend, and his children. When he was brought into the emergency department he was found to have oxygen saturations  Of 88-89%. He was placed on oxygen by nasal cannula and was given breathing treatments and his mental status slowly improved. Patient tells me that he has had a cough for the last 5-6 days. Denies any expectoration. He has noticed some soreness in the chest with coughing, but denies any pressure-like sensation. Denies any nausea, vomiting. His temperature was 100.7 yesterday. Denies any chills. He does have chronic headaches. He cannot remember the events of yesterday, but thinks he may have fallen. Denies any pain in any of his joints at this time. Over the last few days, according to his son, he has had some leg swelling for which he has taken Lasix and the legs have improved. He's had poor oral intake in the last couple days. Denies any sick contacts. So due to his confusion history is limited.  Home Medications: Prior to Admission medications   Medication Sig Start Date End Date Taking? Authorizing Provider  amLODipine (NORVASC) 5 MG tablet Take 5 mg by mouth daily.      Historical Provider, MD  aspirin 81 MG tablet Take 81 mg by mouth daily.      Historical Provider, MD  atorvastatin (LIPITOR) 20 MG tablet  Take 20 mg by mouth daily.    Historical Provider, MD  clopidogrel (PLAVIX) 75 MG tablet Take 75 mg by mouth daily.      Historical Provider, MD  furosemide (LASIX) 40 MG tablet Take 1 tablet (40 mg total) by mouth daily. 06/06/13   Rebecca Eaton, MD  isosorbide mononitrate (IMDUR) 60 MG 24 hr tablet Take 60 mg by mouth daily.      Historical Provider, MD  lisinopril (PRINIVIL,ZESTRIL) 20 MG tablet Take 20 mg by mouth daily.      Historical Provider, MD  metoprolol succinate (TOPROL-XL) 100 MG 24 hr tablet Take 50 mg by mouth 2 (two) times daily. Take with or immediately following a meal. 06/06/13   Rebecca Eaton, MD  nitroGLYCERIN (NITROSTAT) 0.4 MG SL tablet Place 0.4 mg under the tongue every 5 (five) minutes as needed. For chest pain    Historical Provider, MD  potassium chloride SA (K-DUR,KLOR-CON) 20 MEQ tablet Take 20 mEq by mouth 2 (two) times daily.      Historical Provider, MD  Pyridoxine HCl (B-6 PO) Take 1 tablet by mouth daily.    Historical Provider, MD  Tamsulosin HCl (FLOMAX) 0.4 MG CAPS Take 0.4 mg by mouth daily after supper.      Historical Provider, MD  vitamin B-12 1000 MCG tablet Take 1 tablet (1,000 mcg total) by mouth daily. 05/07/13   Orson Eva, MD    Allergies:  Allergies  Allergen Reactions  . Pravachol  myalgias  . Simvastatin     myalgia    Past Medical History: Past Medical History  Diagnosis Date  . Hypertension   . Hx of CABG     1st in 2000 (Dr. Ricard Dillon) x 4, LIMA to LAD, SVG to diagonal, SVG to OM4, SVG to PDA. Re-cath in May 2012 with patent LIMA to LAD, patent SVG to diagonal, patent SVG to OM with left-to-right collaterals to an occluded right.  . MI (myocardial infarction)   . TIA (transient ischemic attack)   . Hyperlipidemia   . COPD (chronic obstructive pulmonary disease)   . CVA (cerebral infarction) 07/2011    Remote left brain infarct with sensory deficits and had a left internal carotid stent placed by Dr. Estanislado Pandy at that time, no  recurrent CVA or TIAs or CNS events.  Marland Kitchen PAD (peripheral artery disease)     Stable. Had a past RFPBG by Dr. Donnetta Hutching in 2009. this graft is occluded with reconstitution in the popliteal artery, which was patent. Right tibial was occluded and 2-vessel runoff on angiography done by Dr. Trula Slade in 12/2010.  . Sick sinus syndrome     PPM implanted for this and syncope   . CHF (congestive heart failure)     Hospitalization for CHF from 05/04/13-05/06/13. Treated medically. Had possible pneumonia with short course of antibiotics.  . Hypothyroid     On supplement  . Abdominal bruit     Loud  . Left ventricular dysfunction   . AAA (abdominal aortic aneurysm)     Has a known AAA of 3.2 x 3.4 cm by abdominal untrasound 03/27/12. Followup abdominal ultrasound on 03/29/13 showed a diameter of 3.6 cm, which is stable  . Peripheral vascular disease     Past Surgical History  Procedure Laterality Date  . Femoral to akpopliteal bypass graft    . Carotid stent Left 06/23/11    By Dr. Trula Slade  . Coronary artery bypass graft  2000    1st in 2000 (Dr. Ricard Dillon) x 4, LIMA to LAD, SVG to diagonal, SVG to OM4, SVG to PDA. Re-cath in May 2012 with patent LIMA to LAD, patent SVG to diagonal, patent SVG to OM with left-to-right collaterals to an occluded right.  . Pacemaker insertion  2003    implanted 2003 by Dr Tami Ribas with gen change (MDT ADDRL1) 08/2010 by [facility]    Social History: He lives by himself. He is quite independent with daily activities. He's continues to drive and ambulate independently. Continues to smoke between a half pack to one pack of cigarettes on a daily basis. He has approximately 60-65-pack-year history of smoking. No alcohol use. No illicit drug use.  Family History:  Family History  Problem Relation Age of Onset  . Cancer Brother 60    oral cawncer  . Cancer Brother     prostate ca  . Hypertension Brother   . Hyperlipidemia Brother      Review of Systems - History obtained from the  patient General ROS: positive for  - fatigue Psychological ROS: negative Ophthalmic ROS: negative ENT ROS: negative Allergy and Immunology ROS: negative Hematological and Lymphatic ROS: negative Endocrine ROS: negative Respiratory ROS: as in hpi Cardiovascular ROS: as in hpi Gastrointestinal ROS: no abdominal pain, change in bowel habits, or black or bloody stools Genito-Urinary ROS: no dysuria, trouble voiding, or hematuria Musculoskeletal ROS: negative Neurological ROS: no TIA or stroke symptoms Dermatological ROS: negative  Physical Examination  Filed Vitals:   12/30/13 1134 12/30/13 1200 12/30/13  1215 12/30/13 1230  BP:  161/76  166/73  Pulse:  80 79 77  Temp:      TempSrc:      Resp:  26 13 25   SpO2: 97%       General appearance: alert, cooperative, appears stated age and no distress Head: Normocephalic, without obvious abnormality, atraumatic Eyes: conjunctivae/corneas clear. PERRL, EOM's intact. Throat: lips, mucosa, and tongue normal; teeth and gums normal Resp: Crackles at the bases. Right more than left. No definite wheezing. Reasonably good air entry bilaterally. Cardio: regular rate and rhythm, S1, S2 normal, no murmur, click, rub or gallop  GI: soft, non-tender; bowel sounds normal; no masses,  no organomegaly Small erythematous area noted in shaft of penis posteriorly. Appears within skin without deep tissue involvement. No drainage. Extremities: extremities normal, atraumatic, no cyanosis or edema Pulses: 2+ and symmetric Skin: Skin color, texture, turgor normal. No rashes or lesions Lymph nodes: Cervical, supraclavicular, and axillary nodes normal. Neurologic: Alert. Oriented to year, month, person, place. He could not tell me today's date. Cranial nerves 2-12 intact. Normal strength and tone. Normal symmetric reflexes.  Laboratory Data: Results for orders placed during the hospital encounter of 12/30/13 (from the past 48 hour(s))  CBC WITH DIFFERENTIAL      Status: Abnormal   Collection Time    12/30/13 11:23 AM      Result Value Range   WBC 16.0 (*) 4.0 - 10.5 K/uL   RBC 4.92  4.22 - 5.81 MIL/uL   Hemoglobin 16.2  13.0 - 17.0 g/dL   HCT 45.8  39.0 - 52.0 %   MCV 93.1  78.0 - 100.0 fL   MCH 32.9  26.0 - 34.0 pg   MCHC 35.4  30.0 - 36.0 g/dL   RDW 14.9  11.5 - 15.5 %   Platelets 145 (*) 150 - 400 K/uL   Neutrophils Relative % 82 (*) 43 - 77 %   Lymphocytes Relative 13  12 - 46 %   Monocytes Relative 5  3 - 12 %   Eosinophils Relative 0  0 - 5 %   Basophils Relative 0  0 - 1 %   Neutro Abs 13.1 (*) 1.7 - 7.7 K/uL   Lymphs Abs 2.1  0.7 - 4.0 K/uL   Monocytes Absolute 0.8  0.1 - 1.0 K/uL   Eosinophils Absolute 0.0  0.0 - 0.7 K/uL   Basophils Absolute 0.0  0.0 - 0.1 K/uL   WBC Morphology INCREASED BANDS (>20% BANDS)    BASIC METABOLIC PANEL     Status: Abnormal   Collection Time    12/30/13 11:23 AM      Result Value Range   Sodium 141  137 - 147 mEq/L   Potassium 4.3  3.7 - 5.3 mEq/L   Chloride 98  96 - 112 mEq/L   CO2 25  19 - 32 mEq/L   Glucose, Bld 144 (*) 70 - 99 mg/dL   BUN 22  6 - 23 mg/dL   Creatinine, Ser 1.55 (*) 0.50 - 1.35 mg/dL   Calcium 8.8  8.4 - 10.5 mg/dL   GFR calc non Af Amer 39 (*) >90 mL/min   GFR calc Af Amer 46 (*) >90 mL/min   Comment: (NOTE)     The eGFR has been calculated using the CKD EPI equation.     This calculation has not been validated in all clinical situations.     eGFR's persistently <90 mL/min signify possible Chronic Kidney  Disease.  PRO B NATRIURETIC PEPTIDE     Status: Abnormal   Collection Time    12/30/13 11:23 AM      Result Value Range   Pro B Natriuretic peptide (BNP) 16965.0 (*) 0 - 450 pg/mL    Radiology Reports: Dg Chest 2 View  12/30/2013   CLINICAL DATA:  Productive cough.  Short of breath.  EXAM: CHEST  2 VIEW  COMPARISON:  DG CHEST 2 VIEW dated 05/05/2013  FINDINGS: Dual lead left subclavian pacemaker device and lead stable and intact. Postoperative changes in the  mediastinum. Tortuous aorta.  Chronic interstitial changes. Retrocardiac left lower lobe airspace disease persists. No pneumothorax. Volume loss at the right lung base.  IMPRESSION: Above findings likely represent chronic changes. No definite active cardiopulmonary disease.   Electronically Signed   By: Maryclare Bean M.D.   On: 12/30/2013 12:41    Electrocardiogram: Atrial paced rhythm, 73 beats per minute. Nonspecific T inversions. Stable compared to previous EKG.  Problem List  Principal Problem:   Acute bronchitis Active Problems:   HTN (hypertension)   Tobacco abuse   History of CVA (cerebrovascular accident)   CAD (coronary artery disease) of artery bypass graft   Sick sinus syndrome   Chronic systolic CHF (congestive heart failure)   Assessment: This is 80 year old, African American male, who presents with cough, shortness of breath, and confusion for the last 24-36 hours, but the cough has been ongoing for the last many days. He continues to smoke cigarettes. His chest x-ray does not show any infiltrates or pulmonary edema. His proBNP is significantly elevated. However I think this is most likely to be Acute bronchitis at this time. Patient could have underlying COPD considering his many years of smoking. Confusion is probably from acute illness, fever, and hypoxia. However, it appears, that he could have had a fall yesterday, so, this will need to be evaluated further.  Plan: #1 acute bronchitis in the setting of tobacco abuse: He will be admitted to the hospital, placed on nebulizer treatments, steroids, and antibiotics. Since he was mildly hypoxic we will continue with oxygen by nasal cannula. According to the ED physician patient had very poor air entry upon initial evaluation, but it has improved after he received nebulizer treatment. Tobacco cessation counseling was provided to the patient. But he got a bit agitated and so the conversation was not continued. He'll be placed on a  nicotine patch while he is in the hospital. Due to history of fever will check for influenza.  #2 acute encephalopathy with a history of fall: It's unclear if he had a syncopal episode. All this is most likely secondary to the above. CT scan will be obtained. He'll be monitored on telemetry. His mental status has improved per his family. He doesn't have any focal deficits. Continue to monitor.  #3 chronic systolic CHF with EF of about 35%: Even though his proBNP is elevated he appears to be reasonably well compensated at this time. We will give him one dose of intravenous Lasix and assess volume status in the morning. Continue with his ACE inhibitor but monitor renal function closely.  #4 history of sick sinus syndrome with a pacemaker in situ: EKG is stable compared to previous. Continue to monitor on telemetry.  #5 history of coronary artery disease, status post CABG: Appears to be stable. Continue his antiplatelet agents and beta blocker.  #6 Penile lesion: Probably small wound but seems to be healing. Will need to be monitored as OP.  DVT Prophylaxis: Heparin Code Status: Full code Family Communication: Discussed with the patient and his 2 sons  Disposition Plan: Admit to telemetry   Further management decisions will depend on results of further testing and patient's response to treatment.  [facility]  [facility] Pager [phone]  If 7PM-7AM, please contact night-coverage [email or web address] Password [facility]  12/30/2013, 2:53 PM  Troponin was noted to be elevated. Patient remains without chest pain. Unclear if this is de novo cardiac event or demand ischemia. Will consult cardiology. Discussed with Dr. Marlou Porch. Will initiate Lovenox for now. Patient already on BB.  [name] 4:01 PM

## 2013-12-30 NOTE — Progress Notes (Addendum)
ANTICOAGULATION CONSULT NOTE - Initial Consult  Pharmacy Consult for Lovenox -->> changed to IV Heparin Indication: ACS/NSTEMI  Allergies  Allergen Reactions  . Pravachol     myalgias  . Simvastatin     myalgia    Patient Measurements: Height: 5' 8.9" (175 cm) (documented 12/13/13) Weight: 145 lb 3.5 oz (65.87 kg) (documented 12/13/13) IBW/kg (Calculated) : 70.47  Vital Signs: Temp: 98.4 F (36.9 C) (01/25 1054) Temp src: Oral (01/25 1054) BP: 125/92 mmHg (01/25 1600) Pulse Rate: 78 (01/25 1600)  Labs:  Recent Labs  12/30/13 1123 12/30/13 1513  HGB 16.2  --   HCT 45.8  --   PLT 145*  --   CREATININE 1.55*  --   TROPONINI  --  3.31*    Estimated Creatinine Clearance: 33.1 ml/min (by C-G formula based on Cr of 1.55).   Assessment: [age] yoM with h/o CHF, HTN, CVA, PAD, CAD s/p MI, CABG presented 1/25 with c/o cough, low grade fever, confusion. Troponin was noted to be elevated at 3.31. Pt denied chest pain. Cardiology consulted. MD ordered to start full dose Lovenox. Scr 1.55, CG 33 ml/min. Pt weights 66kg. Pt is currently on Plavix, BB, ASA.   Goal of Therapy:  Anti-Xa level 0.6-1 units/ml 4hrs after LMWH dose given Monitor platelets by anticoagulation protocol: Yes   Plan:   Lovenox 60 mg sq q12h  F/u cardiology consult   Geoffry Paradisehuyvan Loyed Wilmes, PharmD, BCPS Pager: [phone] 4:50 PM Pharmacy #: [id number]   Addendum:   Cardiology on board. Pt with NSTEMI, possible due to demand ischemia in the setting of acute infection. Changing Lovenox to IV heparin. Based on course, may plan for cath once active infection resolved. First dose of Lovenox not yet given.   Plan: Stat PT/INR and aPTT. Start IV heparin with 3000 units bolus then 1000 units/hr.  Advance rate of IV heparin aggressively if first HL is subtherapeutic. RN made aware of plans. Will check HL with AM labs   Geoffry Paradisehuyvan Loria Lacina, PharmD, BCPS Pager: [phone] 5:57 PM Pharmacy #: [id number]

## 2013-12-30 NOTE — Consult Note (Addendum)
CARDIOLOGY CONSULT NOTE   Patient ID: [name] MRN: [id number], DOB/AGE: 78-Aug-1928   Admit date: 12/30/2013 Date of Consult: 12/30/2013   Primary Physician: Delorse Lek, MD Primary Cardiologist: Dr Hillis Range, previously Dr [name]  Pt. Profile  NSTEMI  Problem List  Past Medical History  Diagnosis Date  . Hypertension   . Hx of CABG     1st in 2000 (Dr. Barry Dienes) x 4, LIMA to LAD, SVG to diagonal, SVG to OM4, SVG to PDA. Re-cath in May 2012 with patent LIMA to LAD, patent SVG to diagonal, patent SVG to OM with left-to-right collaterals to an occluded right.  . MI (myocardial infarction)   . TIA (transient ischemic attack)   . Hyperlipidemia   . COPD (chronic obstructive pulmonary disease)   . CVA (cerebral infarction) 07/2011    Remote left brain infarct with sensory deficits and had a left internal carotid stent placed by Dr. [name] at that time, no recurrent CVA or TIAs or CNS events.  Marland Kitchen PAD (peripheral artery disease)     Stable. Had a past RFPBG by Dr. [name] in 2009. this graft is occluded with reconstitution in the popliteal artery, which was patent. Right tibial was occluded and 2-vessel runoff on angiography done by [name] in 12/2010.  . Sick sinus syndrome     PPM implanted for this and syncope   . CHF (congestive heart failure)     Hospitalization for CHF from 05/04/13-05/06/13. Treated medically. Had possible pneumonia with short course of antibiotics.  . Hypothyroid     On supplement  . Abdominal bruit     Loud  . Left ventricular dysfunction   . AAA (abdominal aortic aneurysm)     Has a known AAA of 3.2 x 3.4 cm by abdominal untrasound 03/27/12. Followup abdominal ultrasound on 03/29/13 showed a diameter of 3.6 cm, which is stable  . Peripheral vascular disease     Past Surgical History  Procedure Laterality Date  . Femoral to akpopliteal bypass graft    . Carotid stent Left 06/23/11    By [name]  . Coronary artery bypass graft  2000   1st in 2000 (Dr. Barry Dienes) x 4, LIMA to LAD, SVG to diagonal, SVG to OM4, SVG to PDA. Re-cath in May 2012 with patent LIMA to LAD, patent SVG to diagonal, patent SVG to OM with left-to-right collaterals to an occluded right.  . Pacemaker insertion  2003    implanted 2003 by Dr Jenne Campus with gen change (MDT ADDRL1) 08/2010 by [facility]     Allergies  Allergies  Allergen Reactions  . Pravachol     myalgias  . Simvastatin     myalgia    HPI   [name] is a 80 y.o. male with a h/o CAD, S/P CABG in 2000, and cath in 2012 (patent LIMA to LAD, patent SVG to diagonal, patent SVG to OM with left-to-right collaterals to an occluded right), chronic systolic CHF with LVEF 35%, s/p pacemaker placement for symptomatic bradycardia with generator change 9/2058followed by Dr Hillery Jacks, who presented today with cough, low grade fever and confusion since yesterday.  The patient was brought in today by his family because he was found to be lying on the floor. Patient was also confused and disoriented. He didn't know where he was and he couldn't recognize his friend, and his children. When he was brought into the emergency department he was found to have oxygen saturations Of 88-89%. He was placed on oxygen by nasal  cannula and was given breathing treatments and his mental status slowly improved. Patient tells me that he has had a cough for the last 5-6 days. Denies any expectoration. He has noticed some soreness in the chest with coughing, but denies any pressure-like sensation. Denies any nausea, vomiting. His temperature was 100.7 yesterday. Denies any chills. He does have chronic headaches. He cannot remember the events of yesterday, but thinks he may have fallen. Over the last few days, according to his son, he has had some leg swelling for which he has taken Lasix and the legs have improved. He's had poor oral intake in the last couple days. Denies any sick contacts. So due to his confusion history is limited. The patient  was seen by Dr [name] on 12/13/2013 and was asymptomatic at that time. He was found to have elevated troponin today. He is an ongoing smoker.    Inpatient Medications  . albuterol  2.5 mg Nebulization Q6H  . amLODipine  5 mg Oral Daily  . aspirin  81 mg Oral Daily  . atorvastatin  20 mg Oral Daily  . clopidogrel  75 mg Oral Daily  . enoxaparin (LOVENOX) injection  60 mg Subcutaneous Q12H  . furosemide  40 mg Intravenous Once  . [START ON 12/31/2013] furosemide  40 mg Oral Daily  . guaiFENesin  600 mg Oral BID  . ipratropium  0.5 mg Nebulization Q6H  . [START ON 12/31/2013] isosorbide mononitrate  60 mg Oral Daily  . [START ON 12/31/2013] levofloxacin (LEVAQUIN) IV  500 mg Intravenous Q24H  . [START ON 12/31/2013] lisinopril  20 mg Oral Daily  . methylPREDNISolone (SOLU-MEDROL) injection  60 mg Intravenous Q6H  . metoprolol succinate  50 mg Oral BID  . nicotine  14 mg Transdermal Daily  . sodium chloride  3 mL Intravenous Q12H  . sodium chloride  3 mL Intravenous Q12H  . tamsulosin  0.4 mg Oral PC supper      Family History Family History  Problem Relation Age of Onset  . Cancer Brother 60    oral cawncer  . Cancer Brother     prostate ca  . Hypertension Brother   . Hyperlipidemia Brother      Social History History   Social History  . Marital Status: Widowed    Spouse Name: N/A    Number of Children: N/A  . Years of Education: N/A   Occupational History  . Not on file.   Social History Main Topics  . Smoking status: Current Every Day Smoker -- 1.00 packs/day for 60 years    Types: Cigarettes  . Smokeless tobacco: Former Neurosurgeon    Quit date: 06/02/2011  . Alcohol Use: No  . Drug Use: No  . Sexual Activity: No   Other Topics Concern  . Not on file   Social History Narrative   Lives in [place] with spouse.  Receives most care at the [facility].     Review of Systems  General:  No chills, fever, night sweats or weight changes.  Cardiovascular:  No chest pain,  dyspnea on exertion, edema, orthopnea, palpitations, paroxysmal nocturnal dyspnea. Dermatological: No rash, lesions/masses Respiratory: No cough, dyspnea Urologic: No hematuria, dysuria Abdominal:   No nausea, vomiting, diarrhea, bright red blood per rectum, melena, or hematemesis Neurologic:  No visual changes, wkns, changes in mental status. All other systems reviewed and are otherwise negative except as noted above.  Physical Exam  Blood pressure 167/76, pulse 84, temperature 98.2 F (36.8 C), temperature  source Oral, resp. rate 22, height 5' 8.9" (1.75 m), weight 145 lb 3.5 oz (65.87 kg), SpO2 95.00%.  General: Pleasant, NAD Psych: Normal affect. Neuro: Alert and oriented X 3. Moves all extremities spontaneously. HEENT: Normal  Neck: Supple without bruits or JVD. Lungs:  Resp regular and unlabored, crackles at the bases. Heart: RRR no s3, s4, or murmurs. Abdomen: Soft, non-tender, non-distended, BS + x 4.  Extremities: No clubbing, cyanosis, mild unilateral edema of the RLE. DP/PT/Radials 2+ and equal bilaterally.  Labs   Recent Labs  12/30/13 1513  TROPONINI 3.31*   Lab Results  Component Value Date   WBC 16.0* 12/30/2013   HGB 16.2 12/30/2013   HCT 45.8 12/30/2013   MCV 93.1 12/30/2013   PLT 145* 12/30/2013    Recent Labs Lab 12/30/13 1123  NA 141  K 4.3  CL 98  CO2 25  BUN 22  CREATININE 1.55*  CALCIUM 8.8  GLUCOSE 144*   Lab Results  Component Value Date   CHOL 101 07/06/2011   HDL 39* 07/06/2011   LDLCALC 50 07/06/2011   TRIG 61 07/06/2011   Radiology/Studies  Dg Chest 2 View  12/30/2013   CLINICAL DATA:  Productive cough.  Short of breath.  EXAM: CHEST  2 VIEW  COMPARISON:  DG CHEST 2 VIEW dated 05/05/2013  FINDINGS: Dual lead left subclavian pacemaker device and lead stable and intact. Postoperative changes in the mediastinum. Tortuous aorta.  Chronic interstitial changes. Retrocardiac left lower lobe airspace disease persists. No pneumothorax. Volume loss  at the right lung base.  IMPRESSION: Above findings likely represent chronic changes. No definite active cardiopulmonary disease.   Electronically Signed   By: Maryclare BeanArt  Hoss M.D.   On: 12/30/2013 12:41   Ct Head Wo Contrast  12/30/2013   CLINICAL DATA:  Flu-like symptoms.  Left eye pain.  EXAM: CT HEAD WITHOUT CONTRAST  TECHNIQUE: Contiguous axial images were obtained from the base of the skull through the vertex without intravenous contrast.  COMPARISON:  06/27/2013  FINDINGS: Global atrophy. No mass effect, midline shift, or acute intracranial hemorrhage. Chronic ischemic changes in the periventricular white matter and left thalamus. Mastoid air cells are clear.  IMPRESSION: No acute intracranial pathology.   Electronically Signed   By: Maryclare BeanArt  Hoss M.D.   On: 12/30/2013 14:59    Echocardiogram - 03/29/2013  - Left ventricle: The cavity size was mildly dilated. Systolic function was moderately reduced. The estimated ejection fraction was in the range of 35% to 40%. Moderate hypokinesis of the inferolateral myocardium. Mild hypokinesis of the inferior myocardium. Doppler parameters are consistent with abnormal left ventricular relaxation (grade 1 diastolic dysfunction). - Aortic valve: Mild regurgitation. - Mitral valve: Calcified annulus. Mild regurgitation. - Left atrium: The atrium was mildly dilated. - Right ventricle: Systolic pressure was increased. - Right atrium: The atrium was mildly dilated. - Tricuspid valve: Moderate regurgitation. - Pulmonary arteries: PA peak pressure: 49mm Hg (S). Impressions:  - Atrial paced rhythm with intact A-V conduction. The right ventricular systolic pressure was increased consistent with moderate pulmonary hypertension.  ECG: Atrial-paced rhythm, PACs, Incomplete RBBB and LAFB, LVH with secondary repolarization abnormality, unchanged from ECG on 05/04/2013    ASSESSMENT AND PLAN  80 year old male   1. NSTEMI - possibly due to demand ischemia in the  settings of acute URI. We will start heparin drip. We will continue to monitor ECG and cardiac enzymes. Continue ASA, Plavix, BB, ACEI, statin. Based on the course we will plan for  a cath once the active infection has resolved.  2. Acute on chronic systolic CHF - LVEF 35%, we will continue careful iv diuresis considering acute on chronic CKD, no more lasic today, will re-evaluate in the am  3. URI - most probably bronchitis - no pneumonia on CXR, on ATB, influenza should be ruled out  4. Acute on chronic CKD - secondary to poor oral intake  5. Symptomatic bradycardia - syncope, I called [employer] to interrogate for possible arrhythmias vs PM failure   6. RLE edema - we will check for DVT  7. Hypertension - uncontrolled, we will restart home meds  Signed, Lars Masson, MD, [facility] 12/30/2013, 5:03 PM

## 2013-12-30 NOTE — ED Notes (Signed)
Bed: [id number] Expected date: 12/30/13 Expected time: 10:21 AM Means of arrival: Ambulance Comments: EMS

## 2013-12-30 NOTE — ED Notes (Addendum)
Pt from home via EMS c/o cold/flu s/sx x2 days. Pt has non-productive cough, malaise, body aches, poor po intake, low grade fever. Pt denies N/V/D. Pt called PCP and was told was virus. Pt is A&O and in NAD. Pt adds that his L eye is hurting

## 2013-12-30 NOTE — ED Notes (Signed)
Respiratory at bedside.

## 2013-12-30 NOTE — ED Notes (Signed)
Pt from home c/o non-productive dry cough, body aches x2 days. Pt denies SOB but states chest is sore from coughing. Pt denies N/V/D. Pt is A&O and in NAD

## 2013-12-31 DIAGNOSIS — R609 Edema, unspecified: Secondary | ICD-10-CM

## 2013-12-31 DIAGNOSIS — I5023 Acute on chronic systolic (congestive) heart failure: Secondary | ICD-10-CM

## 2013-12-31 DIAGNOSIS — I10 Essential (primary) hypertension: Secondary | ICD-10-CM

## 2013-12-31 DIAGNOSIS — I214 Non-ST elevation (NSTEMI) myocardial infarction: Secondary | ICD-10-CM

## 2013-12-31 DIAGNOSIS — I495 Sick sinus syndrome: Secondary | ICD-10-CM

## 2013-12-31 LAB — COMPREHENSIVE METABOLIC PANEL
ALT: 12 U/L (ref 0–53)
AST: 87 U/L — ABNORMAL HIGH (ref 0–37)
Albumin: 2.7 g/dL — ABNORMAL LOW (ref 3.5–5.2)
Alkaline Phosphatase: 73 U/L (ref 39–117)
BUN: 34 mg/dL — AB (ref 6–23)
CALCIUM: 8.2 mg/dL — AB (ref 8.4–10.5)
CO2: 25 mEq/L (ref 19–32)
CREATININE: 1.71 mg/dL — AB (ref 0.50–1.35)
Chloride: 100 mEq/L (ref 96–112)
GFR calc Af Amer: 41 mL/min — ABNORMAL LOW (ref >90)
GFR, EST NON AFRICAN AMERICAN: 35 mL/min — AB (ref >90)
Glucose, Bld: 160 mg/dL — ABNORMAL HIGH (ref 70–99)
Potassium: 3.8 mEq/L (ref 3.7–5.3)
Sodium: 140 mEq/L (ref 137–147)
Total Bilirubin: 0.5 mg/dL (ref 0.3–1.2)
Total Protein: 6.8 g/dL (ref 6.0–8.3)

## 2013-12-31 LAB — TROPONIN I
Troponin I: 5.35 ng/mL (ref <0.30)
Troponin I: 4.2 ng/mL (ref <0.30)
Troponin I: 4.68 ng/mL (ref <0.30)
Troponin I: 5.03 ng/mL (ref <0.30)

## 2013-12-31 LAB — CBC
HEMATOCRIT: 43.6 % (ref 39.0–52.0)
HEMOGLOBIN: 15.1 g/dL (ref 13.0–17.0)
MCH: 32.1 pg (ref 26.0–34.0)
MCHC: 34.6 g/dL (ref 30.0–36.0)
MCV: 92.8 fL (ref 78.0–100.0)
Platelets: 124 10*3/uL — ABNORMAL LOW (ref 150–400)
RBC: 4.7 MIL/uL (ref 4.22–5.81)
RDW: 14.7 % (ref 11.5–15.5)
WBC: 18.8 10*3/uL — ABNORMAL HIGH (ref 4.0–10.5)

## 2013-12-31 LAB — HEPARIN LEVEL (UNFRACTIONATED)
HEPARIN UNFRACTIONATED: 0.58 [IU]/mL (ref 0.30–0.70)
Heparin Unfractionated: 0.86 IU/mL — ABNORMAL HIGH (ref 0.30–0.70)

## 2013-12-31 MED ORDER — IPRATROPIUM-ALBUTEROL 0.5-2.5 (3) MG/3ML IN SOLN
3.0000 mL | Freq: Four times a day (QID) | RESPIRATORY_TRACT | Status: DC | PRN
  Administered 2014-01-01: 3 mL via RESPIRATORY_TRACT
  Filled 2013-12-31: qty 3

## 2013-12-31 MED ORDER — LEVOFLOXACIN IN D5W 250 MG/50ML IV SOLN
250.0000 mg | INTRAVENOUS | Status: DC
  Administered 2013-12-31 – 2014-01-01 (×2): 250 mg via INTRAVENOUS
  Filled 2013-12-31 (×3): qty 50

## 2013-12-31 MED ORDER — ZOLPIDEM TARTRATE 5 MG PO TABS
5.0000 mg | ORAL_TABLET | Freq: Once | ORAL | Status: AC
  Administered 2013-12-31: 5 mg via ORAL
  Filled 2013-12-31: qty 1

## 2013-12-31 MED ORDER — PREDNISONE 20 MG PO TABS
40.0000 mg | ORAL_TABLET | Freq: Every day | ORAL | Status: DC
  Administered 2014-01-01 – 2014-01-04 (×4): 40 mg via ORAL
  Filled 2013-12-31 (×5): qty 2

## 2013-12-31 MED ORDER — HEPARIN (PORCINE) IN NACL 100-0.45 UNIT/ML-% IJ SOLN
850.0000 [IU]/h | INTRAMUSCULAR | Status: DC
  Administered 2014-01-01: 850 [IU]/h via INTRAVENOUS
  Filled 2013-12-31 (×2): qty 250

## 2013-12-31 MED ORDER — ZOLPIDEM TARTRATE 5 MG PO TABS
5.0000 mg | ORAL_TABLET | Freq: Every evening | ORAL | Status: DC | PRN
  Administered 2013-12-31 – 2014-01-04 (×5): 5 mg via ORAL
  Filled 2013-12-31 (×6): qty 1

## 2013-12-31 MED ORDER — ENSURE COMPLETE PO LIQD
237.0000 mL | Freq: Two times a day (BID) | ORAL | Status: DC
  Administered 2013-12-31 – 2014-01-02 (×4): 237 mL via ORAL

## 2013-12-31 NOTE — Progress Notes (Signed)
PT Cancellation Note  Patient Details Name: [name] MRN: [id number] DOB: 03-10-1927   Cancelled Treatment:    Reason Eval/Treat Not Completed: Medical issues which prohibited therapy--noted increased troponin. Will hold PT at this time. Will check back on tomorrow. Thanks.    Rebeca AlertJannie Idona Stach, MPT Pager: [phone]

## 2013-12-31 NOTE — Progress Notes (Addendum)
INITIAL NUTRITION ASSESSMENT  DOCUMENTATION CODES Per approved criteria  -Non-severe (moderate) malnutrition in the context of social or environmental circumstances  Pt meets criteria for moderate MALNUTRITION in the context of social circumstances (wife passing) as evidenced by moderate wasting of clavicle temporal and occipital regions and PO intake <75% > 3 months..   INTERVENTION: -Recommend Ensure Complete po BID, each supplement provides 350 kcal and 13 grams of protein   NUTRITION DIAGNOSIS: Inadequate oral intake related to social circumstance/family loss as evidenced by PO intake <75% for > 3 months  Goal: Pt to meet >/= 90% of their estimated nutrition needs    Monitor:  Total protein/energy intake, labs, weights  Reason for Assessment: MST  80 y.o. male  Admitting Dx: Acute bronchitis  ASSESSMENT: Pt was brought in today by his family because he was found to be lying on the floor. Patient was also confused and disoriented. He didn't know where he was and he couldn't recognize his friend, and his children. When he was brought into the emergency department he was found to have oxygen saturations Of 88-89%. He was placed on oxygen by nasal cannula and was given breathing treatments and his mental status slowly improved. Patient tells me that he has had a cough for the last 5-6 days. Denies any expectoration. He has noticed some soreness in the chest with coughing, but denies any pressure-like sensation. Denies any nausea, vomiting. His temperature was 100.7 yesterday. Denies any chills. He does have chronic headaches. He cannot remember the events of yesterday, but thinks he may have fallen. Denies any pain in any of his joints at this time. Over the last few days, according to his son, he has had some leg swelling for which he has taken Lasix and the legs have improved. He's had poor oral intake in the last couple days  -Pt denied any significant changes in weight. Has  increased 13 lbs in past 7 months per previous medical records -Family reported pt with poor PO intake last few days likely r/t confusion per MD note. Pt denied any nausea/vomiting -Reported overall decreased appetite since 04/2013 after his wife passed. Wife prepared meals for pt, and pt with limited knowledge of how to cook. Daughters assist by cooking for pt occasionally, and pt goes out to eat 2x/week -Diet recall indicates pt consumes two meals/day, usually breakfast and dinner -Drinks Ensure 1-2 daily. Will continue with supplement regimen during admit. -Pt with fair appetite during admit, eating >50% of meals -Pt with visible moderate wasting around clavicle,  occipital and temporal wasting -RLE edema present   Height: Ht Readings from Last 1 Encounters:  12/30/13 5' 8.9" (1.75 m)    Weight: Wt Readings from Last 1 Encounters:  12/30/13 145 lb 3.5 oz (65.87 kg)    Ideal Body Weight: 160 lbs  % Ideal Body Weight: 91%  Wt Readings from Last 10 Encounters:  12/30/13 145 lb 3.5 oz (65.87 kg)  12/13/13 145 lb (65.772 kg)  05/06/13 132 lb 7.9 oz (60.1 kg)  08/28/12 140 lb (63.504 kg)  02/22/12 143 lb (64.864 kg)  02/22/12 143 lb (64.864 kg)  02/07/12 143 lb (64.864 kg)    Usual Body Weight: 140-145 lbs  % Usual Body Weight: 100%  BMI:  Body mass index is 21.51 kg/(m^2). Normal weight  Estimated Nutritional Needs: Kcal: 1650-1850  Protein: 70-80 gram Fluid: 1.5-1.8L/daily  Skin: WDL  Diet Order: Cardiac  EDUCATION NEEDS: -No education needs identified at this time   Intake/Output Summary (  Last 24 hours) at 12/31/13 1108 Last data filed at 12/31/13 0630  Gross per 24 hour  Intake 581.67 ml  Output   1150 ml  Net -568.33 ml    Last BM: 1/24   Labs:   Recent Labs Lab 12/30/13 1123 12/31/13 0250  NA 141 140  K 4.3 3.8  CL 98 100  CO2 25 25  BUN 22 34*  CREATININE 1.55* 1.71*  CALCIUM 8.8 8.2*  GLUCOSE 144* 160*    CBG (last 3)  No results  found for this basename: GLUCAP,  in the last 72 hours  Scheduled Meds: . albuterol  2.5 mg Nebulization Q6H  . amLODipine  10 mg Oral Daily  . aspirin  325 mg Oral Daily  . atorvastatin  20 mg Oral Daily  . clopidogrel  75 mg Oral Daily  . feeding supplement (ENSURE COMPLETE)  237 mL Oral BID BM  . guaiFENesin  600 mg Oral BID  . ipratropium  0.5 mg Nebulization Q6H  . isosorbide mononitrate  60 mg Oral Daily  . levofloxacin (LEVAQUIN) IV  250 mg Intravenous Q24H  . lisinopril  20 mg Oral Daily  . methylPREDNISolone (SOLU-MEDROL) injection  60 mg Intravenous Q6H  . metoprolol succinate  100 mg Oral Daily  . nicotine  14 mg Transdermal Daily  . oseltamivir  75 mg Oral QHS  . sodium chloride  3 mL Intravenous Q12H  . sodium chloride  3 mL Intravenous Q12H  . tamsulosin  0.4 mg Oral QPC supper    Continuous Infusions: . heparin 1,000 Units/hr (12/30/13 1820)    Past Medical History  Diagnosis Date  . Hypertension   . Hx of CABG     1st in 2000 (Dr. Barry Dienes) x 4, LIMA to LAD, SVG to diagonal, SVG to OM4, SVG to PDA. Re-cath in May 2012 with patent LIMA to LAD, patent SVG to diagonal, patent SVG to OM with left-to-right collaterals to an occluded right.  . MI (myocardial infarction)   . TIA (transient ischemic attack)   . Hyperlipidemia   . COPD (chronic obstructive pulmonary disease)   . CVA (cerebral infarction) 07/2011    Remote left brain infarct with sensory deficits and had a left internal carotid stent placed by Dr. [name] at that time, no recurrent CVA or TIAs or CNS events.  Marland Kitchen PAD (peripheral artery disease)     Stable. Had a past RFPBG by Dr. [name] in 2009. this graft is occluded with reconstitution in the popliteal artery, which was patent. Right tibial was occluded and 2-vessel runoff on angiography done by [name] in 12/2010.  . Sick sinus syndrome     PPM implanted for this and syncope   . CHF (congestive heart failure)     Hospitalization for CHF from  05/04/13-05/06/13. Treated medically. Had possible pneumonia with short course of antibiotics.  . Hypothyroid     On supplement  . Abdominal bruit     Loud  . Left ventricular dysfunction   . AAA (abdominal aortic aneurysm)     Has a known AAA of 3.2 x 3.4 cm by abdominal untrasound 03/27/12. Followup abdominal ultrasound on 03/29/13 showed a diameter of 3.6 cm, which is stable  . Peripheral vascular disease     Past Surgical History  Procedure Laterality Date  . Femoral to akpopliteal bypass graft    . Carotid stent Left 06/23/11    By [name]  . Coronary artery bypass graft  2000    1st  in 2000 (Dr. Barry Dienes) x 4, LIMA to LAD, SVG to diagonal, SVG to OM4, SVG to PDA. Re-cath in May 2012 with patent LIMA to LAD, patent SVG to diagonal, patent SVG to OM with left-to-right collaterals to an occluded right.  . Pacemaker insertion  2003    implanted 2003 by Dr Jenne Campus with gen change (MDT ADDRL1) 08/2010 by Mikel Cella MS RD LDN Clinical Dietitian Pager:[phone]

## 2013-12-31 NOTE — Progress Notes (Signed)
CRITICAL VALUE ALERT  Critical value received:  troponin 5.35  Date of notification: 12/31/13  Time of notification:  0330  Critical value read back: no...laboratory did not give verbal notification to staff of critical value   MD notified (1st page): CARDIOLOGY not notified at this time as this result is in line with previous results and MD is aware of trend in troponin

## 2013-12-31 NOTE — Progress Notes (Signed)
OT Cancellation Note  Patient Details Name: Fransico HimRaymond Gledhill MRN: [id number] DOB: [birth year]   Cancelled Treatment:    Reason Eval/Treat Not Completed: Medical issues which prohibited therapy. Note increased troponin. Will hold on OT eval currently.  Lennox LaityStone, Benigno Check Stafford [phone] 12/31/2013, 12:31 PM

## 2013-12-31 NOTE — Care Management Note (Addendum)
    Page 1 of 2   01/04/2014     4:23:13 PM   CARE MANAGEMENT NOTE 01/04/2014  Patient:  [name],[name]   Account Number:  [id number]  Date Initiated:  12/31/2013  Documentation initiated by:  Lanier ClamMAHABIR,Emanii Bugbee  Subjective/Objective Assessment:   80 Y/O M ADMITTED W/COUGH,FEVER,CONFUSION.ACUTE BRONCHITIS.HX:CAD,CABG,CHF.     Action/Plan:   FROM HOME ALONE.   Anticipated DC Date:  01/05/2014   Anticipated DC Plan:  HOME W HOME HEALTH SERVICES      DC Planning Services  CM consult      Choice offered to / List presented to:  C-1 Patient        HH arranged  HH-1 RN  HH-2 PT  HH-3 OT      [facility]   Status of service:  In process, will continue to follow Medicare Important Message given?   (If response is "NO", the following Medicare IM given date fields will be blank) Date Medicare IM given:   Date Additional Medicare IM given:    Discharge Disposition:    Per UR Regulation:  Reviewed for med. necessity/level of care/duration of stay  If discussed at Long Length of Stay Meetings, dates discussed:    Comments:  01/04/14 Heraclio Seidman RN,BSN NCM [phone] [facility] CHOSEN FOR HHC.TC KRISTEN REP AWARE OF HHC ORDERS,AWAITING D/C ORDER.  01/03/14 Indya Oliveria RN,BSN NCM [phone] CARDIO FOLLOWING.FOR STRESS TEST IN AM.  12/31/13 Audianna Landgren RN,BSN NCM [phone] CARDIO FOLLOWING-NOTED FOR CATH ONCE INFECTION RESOLVED.AWAIT PT RECOMMENDATIONS.

## 2013-12-31 NOTE — Progress Notes (Addendum)
ANTICOAGULATION CONSULT NOTE - Follow Up  Pharmacy Consult for Heparin Indication: ACS/NSTEMI  Allergies  Allergen Reactions  . Pravachol     myalgias  . Simvastatin     myalgia    Patient Measurements: Height: 5' 8.9" (175 cm) (documented 12/13/13) Weight: 145 lb 3.5 oz (65.87 kg) (documented 12/13/13) IBW/kg (Calculated) : 70.47  Vital Signs: Temp: 97.4 F (36.3 C) (01/26 0621) Temp src: Oral (01/26 0621) BP: 131/80 mmHg (01/26 0621) Pulse Rate: 80 (01/26 0621)  Labs:  Recent Labs  12/30/13 1123 12/30/13 1513 12/30/13 1811 12/30/13 2045 12/31/13 0250  HGB 16.2  --   --   --  15.1  HCT 45.8  --   --   --  43.6  PLT 145*  --   --   --  124*  APTT  --   --  36  --   --   LABPROT  --   --  14.8  --   --   INR  --   --  1.19  --   --   HEPARINUNFRC  --   --   --   --  0.58  CREATININE 1.55*  --   --   --  1.71*  CKTOTAL  --  2311*  --   --   --   CKMB  --  7.6*  --   --   --   TROPONINI  --  3.31*  --  4.18* 5.35*    Estimated Creatinine Clearance: 30 ml/min (by C-G formula based on Cr of 1.71).   Assessment: 89 yoM with h/o CHF, HTN, CVA, PAD, CAD s/p MI, CABG presented 1/25 with c/o cough, low grade fever, confusion. Troponin was noted to be elevated at 3.31. Pt denied chest pain. Cardiology consulted -  Pt with NSTEMI, possible due to demand ischemia in the setting of acute infection. IV heparin chosen after Lovenox initially wanted but first dose of Lovenox was not given yet. Based on course, may plan for cath once active infection resolved. Pt weights 66kg. Pt is currently on Plavix, BB, ASA.   Heparin level therapeutic this AM after 3000 unit bolus then 1000 units/hr  No reported bleeding  Goal of Therapy:  Heparin Level 0.3-0.7 Monitor platelets by anticoagulation protocol: Yes   Plan:  1) Continue IV heparin 1000 units/hr 2) Recheck heparin level at NOON today 3) Noted plan per cards for cath after active infection is resolved   Hessie KnowsJustin M Nelia Rogoff,  PharmD, BCPS Pager [phone] 12/31/2013 7:32 AM

## 2013-12-31 NOTE — Progress Notes (Signed)
[facility] PROGRESS NOTE  [name] ZOX:[id number] DOB: [birth year] DOA: 12/30/2013 PCP: Delorse LekBURNETT,BRENT A, MD  Assessment/Plan: #1.URI/bronchitis/influenza A. -Continue Tamiflu and nebulized bronchodilators -Continue empiric antibiotics -Wean steroids/change to prednisone in a.m. -Worsening leukocytosis most likely due to steroids>> he is improving clinically is afebrile and hemodynamically stable #2Elevated troponin -Appreciate cardiology assistance, his chest pain-free. -Continue heparin, Plavix, metoprolol, Lipitor and aspirin -Cardiology to follow and decide on timing of cardiac cath -#3. acute encephalopathy with a history of fall: It's unclear if he had a syncopal episode. All this is most likely secondary to the above. CT scan will be obtained. He'll be monitored on telemetry. His mental status is at baseline at this time. He doesn't have any focal deficits. Continue to monitor.  #4.chronic systolic CHF with EF of about 81%35%: Even though his proBNP is elevated he appears to be reasonably well compensated at this time.  -Trending up,  No further Lasix today. -Will hold ACE inhibitor in a.m. follow recheck. #5. history of sick sinus syndrome with a pacemaker in situ: EKG is stable compared to previous. Continue to monitor on telemetry.  # 6.history of coronary artery disease, status post CABG: Appears to be stable. Continue his antiplatelet agents and beta blocker.  #7. Penile lesion: Probably small wound but seems to be healing. Will need to be monitored as OP.   Code Status: Full Family Communication: None at bedside Disposition Plan: Pending clinical course   Consultants:  Cardiology  Procedures:  Lower extremity ultrasound, right Summary:  - No evidence of deep vein or superficial thrombosis involving the right lower extremity and left common femoral vein. - No evidence of Baker's cyst on the right. Other specific details can be found in the table(s)  above.   Antibiotics:  Levaquin started on 1/25  HPI/Subjective: pt denies chest pain, also denies shortness of breath and reports decreased cough.  Objective: Filed Vitals:   12/31/13 1253  BP: 134/66  Pulse: 75  Temp: 97.2 F (36.2 C)  Resp:     Intake/Output Summary (Last 24 hours) at 12/31/13 1859 Last data filed at 12/31/13 1851  Gross per 24 hour  Intake 701.67 ml  Output    950 ml  Net -248.33 ml   Filed Weights   12/30/13 1638  Weight: 65.87 kg (145 lb 3.5 oz)    Exam:  General: alert & oriented x 3 In NAD Cardiovascular: RRR, nl S1 s2 Respiratory: Moderate air entry, otherwise no crackles or wheezes. Abdomen: soft +BS NT/ND, no masses palpable Extremities: No cyanosis and no edema     Data Reviewed: Basic Metabolic Panel:  Recent Labs Lab 12/30/13 1123 12/31/13 0250  NA 141 140  K 4.3 3.8  CL 98 100  CO2 25 25  GLUCOSE 144* 160*  BUN 22 34*  CREATININE 1.55* 1.71*  CALCIUM 8.8 8.2*   Liver Function Tests:  Recent Labs Lab 12/31/13 0250  AST 87*  ALT 12  ALKPHOS 73  BILITOT 0.5  PROT 6.8  ALBUMIN 2.7*   No results found for this basename: LIPASE, AMYLASE,  in the last 168 hours No results found for this basename: AMMONIA,  in the last 168 hours CBC:  Recent Labs Lab 12/30/13 1123 12/31/13 0250  WBC 16.0* 18.8*  NEUTROABS 13.1*  --   HGB 16.2 15.1  HCT 45.8 43.6  MCV 93.1 92.8  PLT 145* 124*   Cardiac Enzymes:  Recent Labs Lab 12/30/13 1513 12/30/13 2045 12/31/13 0250 12/31/13 0905 12/31/13 1424  CKTOTAL  2311*  --   --   --   --   CKMB 7.6*  --   --   --   --   TROPONINI 3.31* 4.18* 5.35* 4.20* 4.68*   BNP (last 3 results)  Recent Labs  05/04/13 1527 05/05/13 0515 12/30/13 1123  PROBNP 6583.0* 8553.0* 16965.0*   CBG: No results found for this basename: GLUCAP,  in the last 168 hours  No results found for this or any previous visit (from the past 240 hour(s)).   Studies: Dg Chest 2 View  12/30/2013    CLINICAL DATA:  Productive cough.  Short of breath.  EXAM: CHEST  2 VIEW  COMPARISON:  DG CHEST 2 VIEW dated 05/05/2013  FINDINGS: Dual lead left subclavian pacemaker device and lead stable and intact. Postoperative changes in the mediastinum. Tortuous aorta.  Chronic interstitial changes. Retrocardiac left lower lobe airspace disease persists. No pneumothorax. Volume loss at the right lung base.  IMPRESSION: Above findings likely represent chronic changes. No definite active cardiopulmonary disease.   Electronically Signed   By: Maryclare Bean M.D.   On: 12/30/2013 12:41   Ct Head Wo Contrast  12/30/2013   CLINICAL DATA:  Flu-like symptoms.  Left eye pain.  EXAM: CT HEAD WITHOUT CONTRAST  TECHNIQUE: Contiguous axial images were obtained from the base of the skull through the vertex without intravenous contrast.  COMPARISON:  06/27/2013  FINDINGS: Global atrophy. No mass effect, midline shift, or acute intracranial hemorrhage. Chronic ischemic changes in the periventricular white matter and left thalamus. Mastoid air cells are clear.  IMPRESSION: No acute intracranial pathology.   Electronically Signed   By: Maryclare Bean M.D.   On: 12/30/2013 14:59    Scheduled Meds: . albuterol  2.5 mg Nebulization Q6H  . amLODipine  10 mg Oral Daily  . aspirin  325 mg Oral Daily  . atorvastatin  20 mg Oral Daily  . clopidogrel  75 mg Oral Daily  . feeding supplement (ENSURE COMPLETE)  237 mL Oral BID BM  . guaiFENesin  600 mg Oral BID  . ipratropium  0.5 mg Nebulization Q6H  . isosorbide mononitrate  60 mg Oral Daily  . levofloxacin (LEVAQUIN) IV  250 mg Intravenous Q24H  . lisinopril  20 mg Oral Daily  . metoprolol succinate  100 mg Oral Daily  . nicotine  14 mg Transdermal Daily  . oseltamivir  75 mg Oral QHS  . [START ON 01/01/2014] predniSONE  40 mg Oral Q breakfast  . sodium chloride  3 mL Intravenous Q12H  . sodium chloride  3 mL Intravenous Q12H  . tamsulosin  0.4 mg Oral QPC supper   Continuous  Infusions: . heparin 850 Units/hr (12/31/13 1504)    Principal Problem:   Acute bronchitis Active Problems:   HTN (hypertension)   Tobacco abuse   History of CVA (cerebrovascular accident)   CAD (coronary artery disease) of artery bypass graft   Sick sinus syndrome   Chronic systolic CHF (congestive heart failure)   NSTEMI (non-ST elevated myocardial infarction)    Time spent: 35    [facility] C  [facility] Pager [phone]. If 7PM-7AM, please contact night-coverage at [email or web address], password [facility] 12/31/2013, 6:59 PM  LOS: 1 day

## 2013-12-31 NOTE — Progress Notes (Signed)
Pt's second troponin level was 4.18. Notified MD Yousef on call for Dr. Delton SeeNelson.  No new orders, just spoke about taking pt to cath lab in the near future.  Will continue to monitor pt.

## 2013-12-31 NOTE — Progress Notes (Signed)
Patient Name: [name] Date of Encounter: 12/31/2013  Principal Problem:   Acute bronchitis Active Problems:   HTN (hypertension)   Tobacco abuse   History of CVA (cerebrovascular accident)   CAD (coronary artery disease) of artery bypass graft   Sick sinus syndrome   Chronic systolic CHF (congestive heart failure)   NSTEMI (non-ST elevated myocardial infarction)   Length of Stay: 1  SUBJECTIVE  The patient denies chest pain, SOB or palpitations, he was sleeping flat.   CURRENT MEDS . albuterol  2.5 mg Nebulization Q6H  . amLODipine  10 mg Oral Daily  . aspirin  325 mg Oral Daily  . atorvastatin  20 mg Oral Daily  . clopidogrel  75 mg Oral Daily  . guaiFENesin  600 mg Oral BID  . ipratropium  0.5 mg Nebulization Q6H  . isosorbide mononitrate  60 mg Oral Daily  . levofloxacin (LEVAQUIN) IV  250 mg Intravenous Q24H  . lisinopril  20 mg Oral Daily  . methylPREDNISolone (SOLU-MEDROL) injection  60 mg Intravenous Q6H  . metoprolol succinate  100 mg Oral Daily  . nicotine  14 mg Transdermal Daily  . oseltamivir  75 mg Oral QHS  . sodium chloride  3 mL Intravenous Q12H  . sodium chloride  3 mL Intravenous Q12H  . tamsulosin  0.4 mg Oral QPC supper   OBJECTIVE  Filed Vitals:   12/30/13 2037 12/31/13 0247 12/31/13 0621 12/31/13 0728  BP:   131/80   Pulse: 82  80   Temp:   97.4 F (36.3 C)   TempSrc:   Oral   Resp:   16   Height:      Weight:      SpO2:  95% 98% 98%    Intake/Output Summary (Last 24 hours) at 12/31/13 0827 Last data filed at 12/31/13 [id number]  Gross per 24 hour  Intake    340 ml  Output   1150 ml  Net   -810 ml   Filed Weights   12/30/13 1638  Weight: 145 lb 3.5 oz (65.87 kg)   PHYSICAL EXAM  General: Pleasant, NAD. Neuro: Alert and oriented X 3. Moves all extremities spontaneously. Psych: Normal affect. HEENT:  Normal  Neck: Supple without bruits or JVD. Lungs:  Resp regular and unlabored, CTA. Heart: RRR no s3, s4, or  murmurs. Abdomen: Soft, non-tender, non-distended, BS + x 4.  Extremities: No clubbing, cyanosis or edema. DP/PT/Radials 2+ and equal bilaterally.  Accessory Clinical Findings  CBC  Recent Labs  12/30/13 1123 12/31/13 0250  WBC 16.0* 18.8*  NEUTROABS 13.1*  --   HGB 16.2 15.1  HCT 45.8 43.6  MCV 93.1 92.8  PLT 145* 124*   Basic Metabolic Panel  Recent Labs  12/30/13 1123 12/31/13 0250  NA 141 140  K 4.3 3.8  CL 98 100  CO2 25 25  GLUCOSE 144* 160*  BUN 22 34*  CREATININE 1.55* 1.71*  CALCIUM 8.8 8.2*   Liver Function Tests  Recent Labs  12/31/13 0250  AST 87*  ALT 12  ALKPHOS 73  BILITOT 0.5  PROT 6.8  ALBUMIN 2.7*    Recent Labs  12/30/13 1513 12/30/13 2045 12/31/13 0250  CKTOTAL 2311*  --   --   CKMB 7.6*  --   --   TROPONINI 3.31* 4.18* 5.35*   Radiology/Studies  Dg Chest 2 View  12/30/2013   CLINICAL DATA:  Productive cough.  Short of breath.  EXAM: CHEST  2 VIEW  COMPARISON:  DG CHEST 2 VIEW dated 05/05/2013  FINDINGS: Dual lead left subclavian pacemaker device and lead stable and intact. Postoperative changes in the mediastinum. Tortuous aorta.  Chronic interstitial changes. Retrocardiac left lower lobe airspace disease persists. No pneumothorax. Volume loss at the right lung base.  IMPRESSION: Above findings likely represent chronic changes. No definite active cardiopulmonary disease.   Electronically Signed   By: Maryclare Bean M.D.   On: 12/30/2013 12:41   TELE: A-paced rhythm   ASSESSMENT AND PLAN  80 year old male  1. NSTEMI - possibly due to demand ischemia in the settings of acute URI. Troponin is rising 3.3--> 4.1--> 5.3, the patient is asymptomatic. Continue heparin drip and troponin draw. We will continue to monitor ECG and cardiac enzymes. Continue ASA, Plavix, BB, ACEI, statin. Based on the course we will plan for a cath once the active infection has resolved. WBC is trending up.  2. Acute on chronic systolic CHF - LVEF 35%, we will  continue careful iv diuresis considering acute on chronic CKD, he is euvolemic now, hold lasix  3. URI - most probably bronchitis - no pneumonia on CXR, on ATB, influenza should be ruled out   4. Acute on chronic CKD - secondary to poor oral intake, hold diuretics   5. Symptomatic bradycardia - syncope, [employer] interrogation this morning didn't show any significant arrhythmias other than rare PACs and no pauses  6. RLE edema - we will check for DVT   7. Hypertension - controlled now   Signed, Tobias Alexander, H MD, [facility] 12/31/2013

## 2013-12-31 NOTE — Progress Notes (Signed)
Right lower extremity venous duplex completed.  Right:  No evidence of DVT, superficial thrombosis, or Baker's cyst.  Left:  Negative for DVT in the common femoral vein.  

## 2013-12-31 NOTE — Progress Notes (Signed)
Brief Pharmacy Note - Heparin Follow Up  Labs: heparin level 0.86  A/P: Heparin level supratherapeutic (goal 0.3-0.7). No reported bleeding. Will decrease rate from 1000 units/hr to 850 units/hr. Recheck level in 6 hours  Hessie KnowsJustin M Jazzlyn Huizenga, PharmD, BCPS Pager [phone] 12/31/2013 2:53 PM

## 2014-01-01 ENCOUNTER — Other Ambulatory Visit: Payer: Self-pay

## 2014-01-01 DIAGNOSIS — E440 Moderate protein-calorie malnutrition: Secondary | ICD-10-CM

## 2014-01-01 LAB — HEPARIN LEVEL (UNFRACTIONATED)
HEPARIN UNFRACTIONATED: 0.44 [IU]/mL (ref 0.30–0.70)
Heparin Unfractionated: 0.64 IU/mL (ref 0.30–0.70)

## 2014-01-01 LAB — CBC
HCT: 38.1 % — ABNORMAL LOW (ref 39.0–52.0)
Hemoglobin: 13.4 g/dL (ref 13.0–17.0)
MCH: 32.5 pg (ref 26.0–34.0)
MCHC: 35.2 g/dL (ref 30.0–36.0)
MCV: 92.5 fL (ref 78.0–100.0)
PLATELETS: 141 10*3/uL — AB (ref 150–400)
RBC: 4.12 MIL/uL — AB (ref 4.22–5.81)
RDW: 14.7 % (ref 11.5–15.5)
WBC: 21.7 10*3/uL — AB (ref 4.0–10.5)

## 2014-01-01 LAB — TROPONIN I: Troponin I: 4.56 ng/mL (ref <0.30)

## 2014-01-01 NOTE — Progress Notes (Signed)
PT Cancellation Note  Patient Details Name: Fransico HimRaymond Corning MRN: [id number] DOB: 03/19/1927   Cancelled Treatment:    Reason Eval/Treat Not Completed: Patient not medically ready--Will sign off at this time and ask cardiology to reorder once pt is appropriate for PT. Thanks.    Rebeca AlertJannie Janalyn Higby, MPT Pager: [phone]

## 2014-01-01 NOTE — Progress Notes (Signed)
ANTICOAGULATION CONSULT NOTE - Follow Up Consult  Pharmacy Consult for Heparin Indication: chest pain/ACS  Allergies  Allergen Reactions  . Pravachol     myalgias  . Simvastatin     myalgia    Patient Measurements: Height: 5' 8.9" (175 cm) (documented 12/13/13) Weight: 145 lb 3.5 oz (65.87 kg) (documented 12/13/13) IBW/kg (Calculated) : 70.47 Heparin Dosing Weight:   Vital Signs: Temp: 97.6 F (36.4 C) (01/26 2312) Temp src: Oral (01/26 2312) BP: 135/67 mmHg (01/26 2312) Pulse Rate: 72 (01/26 2312)  Labs:  Recent Labs  12/30/13 1123 12/30/13 1513 12/30/13 1811  12/31/13 0250 12/31/13 0905 12/31/13 1201 12/31/13 1424 12/31/13 2025 12/31/13 2255  HGB 16.2  --   --   --  15.1  --   --   --   --   --   HCT 45.8  --   --   --  43.6  --   --   --   --   --   PLT 145*  --   --   --  124*  --   --   --   --   --   APTT  --   --  36  --   --   --   --   --   --   --   LABPROT  --   --  14.8  --   --   --   --   --   --   --   INR  --   --  1.19  --   --   --   --   --   --   --   HEPARINUNFRC  --   --   --   --  0.58  --  0.86*  --   --  0.64  CREATININE 1.55*  --   --   --  1.71*  --   --   --   --   --   CKTOTAL  --  2311*  --   --   --   --   --   --   --   --   CKMB  --  7.6*  --   --   --   --   --   --   --   --   TROPONINI  --  3.31*  --   < > 5.35* 4.20*  --  4.68* 5.03*  --   < > = values in this interval not displayed.  Estimated Creatinine Clearance: 30 ml/min (by C-G formula based on Cr of 1.71).   Medications:  Infusions:  . heparin 850 Units/hr (12/31/13 1504)    Assessment: Patient with heparin level back at goal.  No issues noted.  Goal of Therapy:  Heparin level 0.3-0.7 units/ml Monitor platelets by anticoagulation protocol: Yes   Plan:  Continue heparin at current rate Follow up with am labs  [name], [name] 01/01/2014,1:47 AM

## 2014-01-01 NOTE — Progress Notes (Signed)
[facility] PROGRESS NOTE  [name] ZOX:[id number] DOB: 1927/05/14 DOA: 12/30/2013 PCP: Delorse Lek, MD  Assessment/Plan: #1.URI/bronchitis/influenza A. -Continue Tamiflu and nebulized bronchodilators -Continue empiric antibiotics -Continue prednisone for bronchitis  -Leukocytosis is secondary to steroids>> he has remained afebrile in the hospital, he is improving clinically and hemodynamically stable(as discussed with cards/Dr. Delton See) #2Elevated troponin -Appreciate cardiology assistance, his chest pain-free. -Continue heparin, Plavix, metoprolol, Lipitor and aspirin -Trop trending up, Cardiology to follow and decide on timing of cardiac cath -#3. acute encephalopathy with a history of fall: It's unclear if he had a syncopal episode. All this is most likely secondary to the above. CT scan of head negative for acute findings.   -His mental status is at baseline at this time. He doesn't have any focal deficits. Continue to monitor.  #4.chronic systolic CHF with EF of about 81%: Even though his proBNP is elevated he appears to be reasonably well compensated at this time.  -cr Trending up 1/26,  and  Lasix held as well as ACE inhibitor -Follow recheck the Bmet in a.m and further adjust meds as appropriate #5. history of sick sinus syndrome with a pacemaker in situ: EKG is stable compared to previous. Continue to monitor on telemetry.  # 6.history of coronary artery disease, status post CABG: Appears to be stable. Continue his antiplatelet agents and beta blocker.  #7. Penile lesion: Probably small wound but seems to be healing. Will need to be monitored as OP. #8 left upper extremity swelling -Obtain Doppler ultrasound, follow and further treat accordingly  Code Status: Full Family Communication: None at bedside Disposition Plan: Pending clinical course   Consultants:  Cardiology  Procedures:  Lower extremity ultrasound, right Summary:  - No evidence of deep vein or  superficial thrombosis involving the right lower extremity and left common femoral vein. - No evidence of Baker's cyst on the right. Other specific details can be found in the table(s) above.  Doppler ultrasound of left upper extremity -Pending Antibiotics:  Levaquin started on 1/25  HPI/Subjective: Denies chest pain on the shortness of breath. Per nursing L.upper extremity swelling  Objective: Filed Vitals:   01/01/14 1434  BP: 100/54  Pulse:   Temp: 97.3 F (36.3 C)  Resp: 18    Intake/Output Summary (Last 24 hours) at 01/01/14 1531 Last data filed at 01/01/14 0930  Gross per 24 hour  Intake    360 ml  Output    700 ml  Net   -340 ml   Filed Weights   12/30/13 1638 01/01/14 0631  Weight: 65.87 kg (145 lb 3.5 oz) 59.467 kg (131 lb 1.6 oz)    Exam:  General: alert & oriented x 3 In NAD Cardiovascular: RRR, nl S1 s2 Respiratory: Moderate air entry, otherwise no crackles or wheezes. Abdomen: soft +BS NT/ND, no masses palpable Extremities: No cyanosis . Left upper extremity edematous, no erythema.    Data Reviewed: Basic Metabolic Panel:  Recent Labs Lab 12/30/13 1123 12/31/13 0250  NA 141 140  K 4.3 3.8  CL 98 100  CO2 25 25  GLUCOSE 144* 160*  BUN 22 34*  CREATININE 1.55* 1.71*  CALCIUM 8.8 8.2*   Liver Function Tests:  Recent Labs Lab 12/31/13 0250  AST 87*  ALT 12  ALKPHOS 73  BILITOT 0.5  PROT 6.8  ALBUMIN 2.7*   No results found for this basename: LIPASE, AMYLASE,  in the last 168 hours No results found for this basename: AMMONIA,  in the last 168 hours CBC:  Recent Labs Lab 12/30/13 1123 12/31/13 0250 01/01/14 0505  WBC 16.0* 18.8* 21.7*  NEUTROABS 13.1*  --   --   HGB 16.2 15.1 13.4  HCT 45.8 43.6 38.1*  MCV 93.1 92.8 92.5  PLT 145* 124* 141*   Cardiac Enzymes:  Recent Labs Lab 12/30/13 1513  12/31/13 0250 12/31/13 0905 12/31/13 1424 12/31/13 2025 01/01/14 1000  CKTOTAL 2311*  --   --   --   --   --   --   CKMB  7.6*  --   --   --   --   --   --   TROPONINI 3.31*  < > 5.35* 4.20* 4.68* 5.03* 4.56*  < > = values in this interval not displayed. BNP (last 3 results)  Recent Labs  05/04/13 1527 05/05/13 0515 12/30/13 1123  PROBNP 6583.0* 8553.0* 16965.0*   CBG: No results found for this basename: GLUCAP,  in the last 168 hours  No results found for this or any previous visit (from the past 240 hour(s)).   Studies: No results found.  Scheduled Meds: . amLODipine  10 mg Oral Daily  . aspirin  325 mg Oral Daily  . atorvastatin  20 mg Oral Daily  . clopidogrel  75 mg Oral Daily  . feeding supplement (ENSURE COMPLETE)  237 mL Oral BID BM  . guaiFENesin  600 mg Oral BID  . isosorbide mononitrate  60 mg Oral Daily  . levofloxacin (LEVAQUIN) IV  250 mg Intravenous Q24H  . metoprolol succinate  100 mg Oral Daily  . nicotine  14 mg Transdermal Daily  . oseltamivir  75 mg Oral QHS  . predniSONE  40 mg Oral Q breakfast  . sodium chloride  3 mL Intravenous Q12H  . sodium chloride  3 mL Intravenous Q12H  . tamsulosin  0.4 mg Oral QPC supper   Continuous Infusions: . heparin 850 Units/hr (12/31/13 1504)    Principal Problem:   Acute bronchitis Active Problems:   HTN (hypertension)   Tobacco abuse   History of CVA (cerebrovascular accident)   CAD (coronary artery disease) of artery bypass graft   Sick sinus syndrome   Chronic systolic CHF (congestive heart failure)   NSTEMI (non-ST elevated myocardial infarction)   Malnutrition of moderate degree    Time spent: 35    [name] C  [facility] Pager [phone]. If 7PM-7AM, please contact night-coverage at [email or web address], password [email or web address] 01/01/2014, 3:31 PM  LOS: 2 days

## 2014-01-01 NOTE — Progress Notes (Signed)
ANTICOAGULATION CONSULT NOTE - Follow Up  Pharmacy Consult for Heparin Indication: ACS/NSTEMI  Allergies  Allergen Reactions  . Pravachol     myalgias  . Simvastatin     myalgia    Patient Measurements: Height: 5' 8.9" (175 cm) (documented 12/13/13) Weight: 131 lb 1.6 oz (59.467 kg) IBW/kg (Calculated) : 70.47  Vital Signs: Temp: 97.4 F (36.3 C) (01/27 0631) Temp src: Oral (01/27 0631) BP: 145/79 mmHg (01/27 0631) Pulse Rate: 80 (01/27 0631)  Labs:  Recent Labs  12/30/13 1123 12/30/13 1513 12/30/13 1811  12/31/13 0250  12/31/13 1201 12/31/13 1424 12/31/13 2025 12/31/13 2255 01/01/14 0505 01/01/14 0850 01/01/14 1000  HGB 16.2  --   --   --  15.1  --   --   --   --   --  13.4  --   --   HCT 45.8  --   --   --  43.6  --   --   --   --   --  38.1*  --   --   PLT 145*  --   --   --  124*  --   --   --   --   --  141*  --   --   APTT  --   --  36  --   --   --   --   --   --   --   --   --   --   LABPROT  --   --  14.8  --   --   --   --   --   --   --   --   --   --   INR  --   --  1.19  --   --   --   --   --   --   --   --   --   --   HEPARINUNFRC  --   --   --   < > 0.58  --  0.86*  --   --  0.64  --  0.44  --   CREATININE 1.55*  --   --   --  1.71*  --   --   --   --   --   --   --   --   CKTOTAL  --  2311*  --   --   --   --   --   --   --   --   --   --   --   CKMB  --  7.6*  --   --   --   --   --   --   --   --   --   --   --   TROPONINI  --  3.31*  --   < > 5.35*  < >  --  4.68* 5.03*  --   --   --  4.56*  < > = values in this interval not displayed.  Estimated Creatinine Clearance: 27.1 ml/min (by C-G formula based on Cr of 1.71).   Assessment: [age] yoM with h/o CHF, HTN, CVA, PAD, CAD s/p MI, CABG presented 1/25 with c/o cough, low grade fever, confusion. Troponin was noted to be elevated at 3.31. Pt denied chest pain. Cardiology consulted -  Pt with NSTEMI, possible due to demand ischemia in the setting of acute infection. IV heparin chosen after Lovenox  initially wanted but  first dose of Lovenox was not given yet. Based on course, may plan for cath once active infection resolved. Pt weights 66kg. Pt is currently on Plavix, BB, ASA.   Heparin level therapeutic this AM after being supratherapeutic yesterday PM requiring dose reduction from 1000 units/hr to 850 units/hr  CBC stable  No reported bleeding  Goal of Therapy:  Heparin Level 0.3-0.7 Monitor platelets by anticoagulation protocol: Yes   Plan:  1) Continue IV heparin 850 units/hr 2) Recheck heparin level in AM 3) Noted plan per cards for cath after active infection is resolved   Hessie Knows, PharmD, BCPS Pager [phone] 01/01/2014 1:10 PM

## 2014-01-01 NOTE — Progress Notes (Signed)
OT Cancellation/Discharge Note  Patient Details Name: [name] MRN: [id number] DOB: 06-26-1927   Cancelled Treatment:    Reason Eval/Treat Not Completed: Medical issues which prohibited therapy.  Spoke to cardiologist and pt is not ready for OT/PT at this time.  Please reorder when pt is ready.  Thank you.  [name] 01/01/2014, 11:20 AM [name], OTR/L [phone] 01/01/2014

## 2014-01-01 NOTE — Progress Notes (Signed)
Patient Name: [name] Date of Encounter: 01/01/2014  Principal Problem:   Acute bronchitis Active Problems:   HTN (hypertension)   Tobacco abuse   History of CVA (cerebrovascular accident)   CAD (coronary artery disease) of artery bypass graft   Sick sinus syndrome   Chronic systolic CHF (congestive heart failure)   NSTEMI (non-ST elevated myocardial infarction)   Length of Stay: 2  SUBJECTIVE  The patient denies chest pain, SOB or palpitations, he was sleeping flat.   CURRENT MEDS . amLODipine  10 mg Oral Daily  . aspirin  325 mg Oral Daily  . atorvastatin  20 mg Oral Daily  . clopidogrel  75 mg Oral Daily  . feeding supplement (ENSURE COMPLETE)  237 mL Oral BID BM  . guaiFENesin  600 mg Oral BID  . isosorbide mononitrate  60 mg Oral Daily  . levofloxacin (LEVAQUIN) IV  250 mg Intravenous Q24H  . metoprolol succinate  100 mg Oral Daily  . nicotine  14 mg Transdermal Daily  . oseltamivir  75 mg Oral QHS  . predniSONE  40 mg Oral Q breakfast  . sodium chloride  3 mL Intravenous Q12H  . sodium chloride  3 mL Intravenous Q12H  . tamsulosin  0.4 mg Oral QPC supper   OBJECTIVE  Filed Vitals:   12/31/13 2038 12/31/13 2312 01/01/14 0631 01/01/14 0816  BP:  135/67 145/79   Pulse:  72 80   Temp:  97.6 F (36.4 C) 97.4 F (36.3 C)   TempSrc:  Oral Oral   Resp:  16 16   Height:      Weight:   131 lb 1.6 oz (59.467 kg)   SpO2: 100% 96% 100% 98%    Intake/Output Summary (Last 24 hours) at 01/01/14 0927 Last data filed at 01/01/14 0717  Gross per 24 hour  Intake    240 ml  Output    700 ml  Net   -460 ml   Filed Weights   12/30/13 1638 01/01/14 0631  Weight: 145 lb 3.5 oz (65.87 kg) 131 lb 1.6 oz (59.467 kg)   PHYSICAL EXAM  General: Pleasant, b/l hand tremor. Neuro: Alert and oriented X 3. Moves all extremities spontaneously. Psych: Normal affect. HEENT:  Normal  Neck: Supple without bruits or JVD. Lungs:  Resp regular and unlabored, CTA. Heart:  RRR no s3, s4, or murmurs. Abdomen: Soft, non-tender, non-distended, BS + x 4.  Extremities: No clubbing, cyanosis or edema. DP/PT/Radials 2+ and equal bilaterally.  Accessory Clinical Findings  CBC  Recent Labs  12/30/13 1123 12/31/13 0250 01/01/14 0505  WBC 16.0* 18.8* 21.7*  NEUTROABS 13.1*  --   --   HGB 16.2 15.1 13.4  HCT 45.8 43.6 38.1*  MCV 93.1 92.8 92.5  PLT 145* 124* 141*   Basic Metabolic Panel  Recent Labs  12/30/13 1123 12/31/13 0250  NA 141 140  K 4.3 3.8  CL 98 100  CO2 25 25  GLUCOSE 144* 160*  BUN 22 34*  CREATININE 1.55* 1.71*  CALCIUM 8.8 8.2*   Liver Function Tests  Recent Labs  12/31/13 0250  AST 87*  ALT 12  ALKPHOS 73  BILITOT 0.5  PROT 6.8  ALBUMIN 2.7*    Recent Labs  12/30/13 1513  12/31/13 0905 12/31/13 1424 12/31/13 2025  CKTOTAL 2311*  --   --   --   --   CKMB 7.6*  --   --   --   --   TROPONINI  3.31*  < > 4.20* 4.68* 5.03*  < > = values in this interval not displayed. Radiology/Studies  Dg Chest 2 View  12/30/2013   CLINICAL DATA:  Productive cough.  Short of breath.  EXAM: CHEST  2 VIEW  COMPARISON:  DG CHEST 2 VIEW dated 05/05/2013  FINDINGS: Dual lead left subclavian pacemaker device and lead stable and intact. Postoperative changes in the mediastinum. Tortuous aorta.  Chronic interstitial changes. Retrocardiac left lower lobe airspace disease persists. No pneumothorax. Volume loss at the right lung base.  IMPRESSION: Above findings likely represent chronic changes. No definite active cardiopulmonary disease.   Electronically Signed   By: Maryclare BeanArt  Hoss M.D.   On: 12/30/2013 12:41   TELE: A-paced rhythm  ECHO 03/29/13 Study Conclusions  - Left ventricle: The cavity size was mildly dilated. Systolic function was moderately reduced. The estimated ejection fraction was in the range of 35% to 40%. Moderate hypokinesis of the inferolateral myocardium. Mild hypokinesis of the inferior myocardium. Doppler parameters are  consistent with abnormal left ventricular relaxation (grade 1 diastolic dysfunction). - Aortic valve: Mild regurgitation. - Mitral valve: Calcified annulus. Mild regurgitation. - Left atrium: The atrium was mildly dilated. - Right ventricle: Systolic pressure was increased. - Right atrium: The atrium was mildly dilated. - Tricuspid valve: Moderate regurgitation. - Pulmonary arteries: PA peak pressure: 49mm Hg (S). Impressions:  - Atrial paced rhythm with intact A-V conduction. The right ventricular systolic pressure was increased consistent with moderate pulmonary hypertension.    ASSESSMENT AND PLAN  80 year old male   1. NSTEMI - possibly due to demand ischemia in the settings of acute URI. Troponin is rising 3.3--> 4.1--> 5.03, the patient is asymptomatic. Continue heparin drip and troponin draw. We will continue to monitor ECG and cardiac enzymes. Continue ASA, Plavix, BB, ACEI, statin. Based on the course we will plan for a cath once the active infection has resolved. WBC is trending up.  2. Acute on chronic systolic CHF - LVEF 35%, we will continue careful iv diuresis considering acute on chronic CKD, he is euvolemic now, hold lasix  3. URI - most probably bronchitis - no pneumonia on CXR, on ATB, influenza should be ruled out   4. Acute on chronic CKD - secondary to poor oral intake, hold diuretics   5. Symptomatic bradycardia - syncope, [employer] interrogation this morning didn't show any significant arrhythmias other than rare PACs and no pauses  6. RLE edema - we will check for DVT   7. Hypertension - controlled now   Signed, Tobias AlexanderNELSON, Jaunita Mikels, H MD, [facility]ACC 01/01/2014

## 2014-01-02 ENCOUNTER — Inpatient Hospital Stay (HOSPITAL_COMMUNITY): Payer: Non-veteran care

## 2014-01-02 DIAGNOSIS — E785 Hyperlipidemia, unspecified: Secondary | ICD-10-CM

## 2014-01-02 DIAGNOSIS — D72829 Elevated white blood cell count, unspecified: Secondary | ICD-10-CM

## 2014-01-02 DIAGNOSIS — N179 Acute kidney failure, unspecified: Secondary | ICD-10-CM

## 2014-01-02 DIAGNOSIS — J111 Influenza due to unidentified influenza virus with other respiratory manifestations: Secondary | ICD-10-CM

## 2014-01-02 DIAGNOSIS — N189 Chronic kidney disease, unspecified: Secondary | ICD-10-CM

## 2014-01-02 LAB — URINALYSIS, ROUTINE W REFLEX MICROSCOPIC
Bilirubin Urine: NEGATIVE
GLUCOSE, UA: NEGATIVE mg/dL
HGB URINE DIPSTICK: NEGATIVE
KETONES UR: NEGATIVE mg/dL
Leukocytes, UA: NEGATIVE
Nitrite: NEGATIVE
PROTEIN: NEGATIVE mg/dL
Specific Gravity, Urine: 1.023 (ref 1.005–1.030)
UROBILINOGEN UA: 0.2 mg/dL (ref 0.0–1.0)
pH: 5 (ref 5.0–8.0)

## 2014-01-02 LAB — CBC
HCT: 36.4 % — ABNORMAL LOW (ref 39.0–52.0)
HEMOGLOBIN: 12.2 g/dL — AB (ref 13.0–17.0)
MCH: 31.5 pg (ref 26.0–34.0)
MCHC: 33.5 g/dL (ref 30.0–36.0)
MCV: 94.1 fL (ref 78.0–100.0)
Platelets: 138 10*3/uL — ABNORMAL LOW (ref 150–400)
RBC: 3.87 MIL/uL — ABNORMAL LOW (ref 4.22–5.81)
RDW: 14.9 % (ref 11.5–15.5)
WBC: 18.8 10*3/uL — ABNORMAL HIGH (ref 4.0–10.5)

## 2014-01-02 LAB — BASIC METABOLIC PANEL
BUN: 82 mg/dL — ABNORMAL HIGH (ref 6–23)
CO2: 26 meq/L (ref 19–32)
CREATININE: 2.22 mg/dL — AB (ref 0.50–1.35)
Calcium: 8.1 mg/dL — ABNORMAL LOW (ref 8.4–10.5)
Chloride: 101 mEq/L (ref 96–112)
GFR calc Af Amer: 30 mL/min — ABNORMAL LOW (ref >90)
GFR, EST NON AFRICAN AMERICAN: 26 mL/min — AB (ref >90)
Glucose, Bld: 134 mg/dL — ABNORMAL HIGH (ref 70–99)
Potassium: 4 mEq/L (ref 3.7–5.3)
Sodium: 139 mEq/L (ref 137–147)

## 2014-01-02 LAB — CREATININE, URINE, RANDOM: Creatinine, Urine: 126.8 mg/dL

## 2014-01-02 LAB — HEPARIN LEVEL (UNFRACTIONATED)
HEPARIN UNFRACTIONATED: 0.73 [IU]/mL — AB (ref 0.30–0.70)
HEPARIN UNFRACTIONATED: 0.43 [IU]/mL (ref 0.30–0.70)

## 2014-01-02 LAB — TROPONIN I: Troponin I: 3.36 ng/mL (ref <0.30)

## 2014-01-02 LAB — SODIUM, URINE, RANDOM: Sodium, Ur: 20 mEq/L

## 2014-01-02 MED ORDER — OSELTAMIVIR PHOSPHATE 30 MG PO CAPS
30.0000 mg | ORAL_CAPSULE | Freq: Every day | ORAL | Status: AC
  Administered 2014-01-02 – 2014-01-03 (×2): 30 mg via ORAL
  Filled 2014-01-02 (×2): qty 1

## 2014-01-02 MED ORDER — AMLODIPINE BESYLATE 5 MG PO TABS
5.0000 mg | ORAL_TABLET | Freq: Every day | ORAL | Status: DC
  Administered 2014-01-02 – 2014-01-05 (×4): 5 mg via ORAL
  Filled 2014-01-02 (×4): qty 1

## 2014-01-02 MED ORDER — HEPARIN (PORCINE) IN NACL 100-0.45 UNIT/ML-% IJ SOLN
800.0000 [IU]/h | INTRAMUSCULAR | Status: DC
  Administered 2014-01-02: 800 [IU]/h via INTRAVENOUS
  Filled 2014-01-02 (×2): qty 250

## 2014-01-02 MED ORDER — LEVOFLOXACIN 250 MG PO TABS
250.0000 mg | ORAL_TABLET | Freq: Every day | ORAL | Status: DC
  Administered 2014-01-02: 250 mg via ORAL
  Filled 2014-01-02: qty 1

## 2014-01-02 MED ORDER — SODIUM CHLORIDE 0.9 % IV SOLN
INTRAVENOUS | Status: DC
  Administered 2014-01-02: 10:00:00 via INTRAVENOUS

## 2014-01-02 MED ORDER — SODIUM CHLORIDE 0.9 % IV SOLN
INTRAVENOUS | Status: AC
  Administered 2014-01-02: 08:00:00 via INTRAVENOUS

## 2014-01-02 NOTE — Progress Notes (Signed)
ANTICOAGULATION CONSULT NOTE - Follow Up  Pharmacy Consult for Heparin Indication: ACS/NSTEMI  Allergies  Allergen Reactions  . Pravachol     myalgias  . Simvastatin     myalgia    Patient Measurements: Height: 5' 8.9" (175 cm) (documented 12/13/13) Weight: 133 lb 6.4 oz (60.51 kg) IBW/kg (Calculated) : 70.47  Vital Signs: Temp: 98.3 F (36.8 C) (01/28 0653) Temp src: Oral (01/28 0653) BP: 103/57 mmHg (01/28 0653) Pulse Rate: 70 (01/28 0653)  Labs:  Recent Labs  12/30/13 1123 12/30/13 1513 12/30/13 1811  12/31/13 0250  12/31/13 2025 12/31/13 2255 01/01/14 0505 01/01/14 0850 01/01/14 1000 01/02/14 0518  HGB 16.2  --   --   --  15.1  --   --   --  13.4  --   --  12.2*  HCT 45.8  --   --   --  43.6  --   --   --  38.1*  --   --  36.4*  PLT 145*  --   --   --  124*  --   --   --  141*  --   --  138*  APTT  --   --  36  --   --   --   --   --   --   --   --   --   LABPROT  --   --  14.8  --   --   --   --   --   --   --   --   --   INR  --   --  1.19  --   --   --   --   --   --   --   --   --   HEPARINUNFRC  --   --   --   --  0.58  < >  --  0.64  --  0.44  --  0.73*  CREATININE 1.55*  --   --   --  1.71*  --   --   --   --   --   --  2.22*  CKTOTAL  --  2311*  --   --   --   --   --   --   --   --   --   --   CKMB  --  7.6*  --   --   --   --   --   --   --   --   --   --   TROPONINI  --  3.31*  --   < > 5.35*  < > 5.03*  --   --   --  4.56* 3.36*  < > = values in this interval not displayed.  Estimated Creatinine Clearance: 21.2 ml/min (by C-G formula based on Cr of 2.22).   Assessment: 32 yoM with h/o CHF, HTN, CVA, PAD, CAD s/p MI, CABG presented 1/25 with c/o cough, low grade fever, confusion. Troponin was noted to be elevated at 3.31. Pt denied chest pain. Cardiology consulted -  Pt with NSTEMI, possible due to demand ischemia in the setting of acute infection. IV heparin chosen after Lovenox initially wanted but first dose of Lovenox was not given yet. Based  on course, may plan for cath once active infection resolved. Pt weights 66kg. Pt is currently on Plavix, BB, ASA.    Heparin level slightly supratherapeutic this AM on 850 units/hr  CBC  stable  No reported bleeding  Goal of Therapy:  Heparin Level 0.3-0.7 Monitor platelets by anticoagulation protocol: Yes   Plan:  1) Decrease IV heparin from 850 units/hr to 800 units/hr 2) Recheck heparin level in 8 hrs 3) Noted plan per cards for cath after SCr improves   Merceda ElksStephanie Tu Shimmel PharmD Candidate 01/02/2014 10:01 AM

## 2014-01-02 NOTE — Progress Notes (Addendum)
[facility] PROGRESS NOTE  [name] JXB:[id number] DOB: 04/08/1927 DOA: 12/30/2013 PCP: Delorse Lek, MD  Assessment/Plan  URI/bronchitis/influenza A.  -Continue Tamiflu and nebulized bronchodilators  -d/c levofloxacin -Continue prednisone for bronchitis   NSTEMI with peak troponin 5.03 on 1/26, likely type 2, however, have not been able to exclude type 1.  Has CAD s/p CABG.  Not a good candidate for cardiac catheterization at this time due to AKI. -Appreciate cardiology assistance -Continue heparin, Plavix, metoprolol, Lipitor and aspirin  -Trop trending down  AKI, baseline creatinine 1.3, currently 2.22, likely prerenal  -  Judicious hydration (has EF of 35%) -  RUS:  Mildly hyperechoic, no obstruction -  FENa:  0.25%, prerenal  Acute encephalopathy with a history of fall: It's unclear if he had a syncopal episode. All this is most likely secondary to the above. CT scan of head negative for acute findings.  -His mental status is at baseline at this time  Chronic systolic CHF with EF of about 13%: Even though his proBNP is elevated he appears euvolemic -  Avoid ACEI/ARB due to AKI -  Continue BB and asa  sick sinus syndrome with a pacemaker in situ: EKG is stable compared to previous.  -  Telemetry:  Paced rhythm  Penile lesion: Probably small wound but seems to be healing. Will need to be monitored as OP.   Left upper extremity swelling:  Duplex negative for DVT  Leukocytosis, likely secondary to steroids and trending down.  No evidence of PNA on CXR or UTI on UA.  Diet:  Healthy heart Access:  PIV IVF:  yes Proph:  heparin  Code Status: full Family Communication: patient alone Disposition Plan: pending improvement in kidney function, cardiac catheterization   Consultants:  Cardiology, Dr. Delton See  Procedures:  RLE duplex  LUE duplex  RUS  Antibiotics: Levaquin started on 1/25     HPI/Subjective:  Denies chest pain.  States he is feeling  well.     Objective: Filed Vitals:   01/01/14 1434 01/01/14 2212 01/02/14 0653 01/02/14 1330  BP: 100/54 107/56 103/57 106/59  Pulse:  71 70 71  Temp: 97.3 F (36.3 C) 98.1 F (36.7 C) 98.3 F (36.8 C) 97.7 F (36.5 C)  TempSrc: Oral Oral Oral Oral  Resp: 18 20 16 20   Height:      Weight:   60.51 kg (133 lb 6.4 oz)   SpO2: 96% 96% 99% 98%    Intake/Output Summary (Last 24 hours) at 01/02/14 1451 Last data filed at 01/02/14 1430  Gross per 24 hour  Intake   1045 ml  Output   1050 ml  Net     -5 ml   Filed Weights   12/30/13 1638 01/01/14 0631 01/02/14 0653  Weight: 65.87 kg (145 lb 3.5 oz) 59.467 kg (131 lb 1.6 oz) 60.51 kg (133 lb 6.4 oz)    Exam:   General:  AAM, No acute distress  HEENT:  NCAT, MMM  Cardiovascular:  Distant heart sounds, nl S1, S2 no mrg, 2+ pulses, warm extremities  Respiratory:  Diminished at LLL, otherwise clear, no increased WOB  Abdomen:   NABS, soft, NT/ND  MSK:   Normal tone and bulk, LUE with 2+ edema  Neuro:  Grossly intact  Data Reviewed: Basic Metabolic Panel:  Recent Labs Lab 12/30/13 1123 12/31/13 0250 01/02/14 0518  NA 141 140 139  K 4.3 3.8 4.0  CL 98 100 101  CO2 25 25 26   GLUCOSE 144* 160* 134*  BUN 22 34* 82*  CREATININE 1.55* 1.71* 2.22*  CALCIUM 8.8 8.2* 8.1*   Liver Function Tests:  Recent Labs Lab 12/31/13 0250  AST 87*  ALT 12  ALKPHOS 73  BILITOT 0.5  PROT 6.8  ALBUMIN 2.7*   No results found for this basename: LIPASE, AMYLASE,  in the last 168 hours No results found for this basename: AMMONIA,  in the last 168 hours CBC:  Recent Labs Lab 12/30/13 1123 12/31/13 0250 01/01/14 0505 01/02/14 0518  WBC 16.0* 18.8* 21.7* 18.8*  NEUTROABS 13.1*  --   --   --   HGB 16.2 15.1 13.4 12.2*  HCT 45.8 43.6 38.1* 36.4*  MCV 93.1 92.8 92.5 94.1  PLT 145* 124* 141* 138*   Cardiac Enzymes:  Recent Labs Lab 12/30/13 1513  12/31/13 0905 12/31/13 1424 12/31/13 2025 01/01/14 1000 01/02/14 0518   CKTOTAL 2311*  --   --   --   --   --   --   CKMB 7.6*  --   --   --   --   --   --   TROPONINI 3.31*  < > 4.20* 4.68* 5.03* 4.56* 3.36*  < > = values in this interval not displayed. BNP (last 3 results)  Recent Labs  05/04/13 1527 05/05/13 0515 12/30/13 1123  PROBNP 6583.0* 8553.0* 16965.0*   CBG: No results found for this basename: GLUCAP,  in the last 168 hours  No results found for this or any previous visit (from the past 240 hour(s)).   Studies: US Renal  01/02/2014   CLINICAL DATA:  Acute kidney injury  EXAM: RENAL/URINARY TRACT ULTRASOUND COMPLETE  COMPARISON:  CT abdomen pelvis dated 04/06/2012  FINDINGS: Right Kidney:  Length: 8.9 cm. Mildly echogenic renal parenchyma. No mass or hydronephrosis.  Left Kidney:  Length: 10.0 cm. Mildly echogenic renal parenchyma. No mass or hydronephrosis.  Bladder:  Within normal limits.  Additional comments:  Mild prostatomegaly.  IMPRESSION: No hydronephrosis.   Electronically Signed   By: Charline Bills M.D.   On: 01/02/2014 12:12    Scheduled Meds: . amLODipine  5 mg Oral Daily  . aspirin  325 mg Oral Daily  . atorvastatin  20 mg Oral Daily  . clopidogrel  75 mg Oral Daily  . feeding supplement (ENSURE COMPLETE)  237 mL Oral BID BM  . guaiFENesin  600 mg Oral BID  . isosorbide mononitrate  60 mg Oral Daily  . levofloxacin  250 mg Oral Daily  . metoprolol succinate  100 mg Oral Daily  . nicotine  14 mg Transdermal Daily  . oseltamivir  30 mg Oral QHS  . predniSONE  40 mg Oral Q breakfast  . sodium chloride  3 mL Intravenous Q12H  . sodium chloride  3 mL Intravenous Q12H  . tamsulosin  0.4 mg Oral QPC supper   Continuous Infusions: . sodium chloride 75 mL/hr at 01/02/14 0807  . sodium chloride 50 mL/hr at 01/02/14 1007  . heparin 800 Units/hr (01/02/14 1100)    Principal Problem:   Acute bronchitis Active Problems:   HTN (hypertension)   Tobacco abuse   History of CVA (cerebrovascular accident)   CAD (coronary  artery disease) of artery bypass graft   Sick sinus syndrome   Chronic systolic CHF (congestive heart failure)   NSTEMI (non-ST elevated myocardial infarction)   Malnutrition of moderate degree    Time spent: 30 min    Adonis Yim  [facility] Pager [phone]. If 7PM-7AM, please contact  night-coverage at [email or web address], password [email or web address] 01/02/2014, 2:51 PM  LOS: 3 days

## 2014-01-02 NOTE — Progress Notes (Signed)
PHARMACIST - PHYSICIAN COMMUNICATION DR:   Malachi BondsShort CONCERNING: Antibiotic IV to Oral Route Change Policy  RECOMMENDATION: This patient is receiving Levaquin by the intravenous route.  Based on criteria approved by the Pharmacy and Therapeutics Committee, the antibiotic(s) is/are being converted to the equivalent oral dose form(s).   DESCRIPTION: These criteria include:  Patient being treated for a respiratory tract infection, urinary tract infection, cellulitis or clostridium difficile associated diarrhea if on metronidazole  The patient is not neutropenic and does not exhibit a GI malabsorption state  The patient is eating (either orally or via tube) and/or has been taking other orally administered medications for a least 24 hours  The patient is improving clinically and has a Tmax < 100.5  If you have questions about this conversion, please contact the Pharmacy Department  []   [phone]( [phone] )  [facility] []   [phone]( [phone] )  [facility]  []   [phone]( [phone]  [facility] [x]   [phone]  [facility]     Hessie KnowsJustin M Moosa Bueche, PharmD, [place] Pager [phone] 01/02/2014 10:57 AM

## 2014-01-02 NOTE — Progress Notes (Signed)
Agree with assessment and plan  Hessie KnowsJustin M Phaedra Colgate, PharmD, BCPS Pager [phone] 01/02/2014 10:53 AM

## 2014-01-02 NOTE — Progress Notes (Signed)
Patient Name: [name] Date of Encounter: 01/02/2014  Principal Problem:   Acute bronchitis Active Problems:   HTN (hypertension)   Tobacco abuse   History of CVA (cerebrovascular accident)   CAD (coronary artery disease) of artery bypass graft   Sick sinus syndrome   Chronic systolic CHF (congestive heart failure)   NSTEMI (non-ST elevated myocardial infarction)   Malnutrition of moderate degree   Length of Stay: 3  SUBJECTIVE  The patient denies chest pain, SOB or palpitations, he feels thirsty.  CURRENT MEDS . amLODipine  10 mg Oral Daily  . aspirin  325 mg Oral Daily  . atorvastatin  20 mg Oral Daily  . clopidogrel  75 mg Oral Daily  . feeding supplement (ENSURE COMPLETE)  237 mL Oral BID BM  . guaiFENesin  600 mg Oral BID  . isosorbide mononitrate  60 mg Oral Daily  . levofloxacin (LEVAQUIN) IV  250 mg Intravenous Q24H  . metoprolol succinate  100 mg Oral Daily  . nicotine  14 mg Transdermal Daily  . oseltamivir  30 mg Oral QHS  . predniSONE  40 mg Oral Q breakfast  . sodium chloride  3 mL Intravenous Q12H  . sodium chloride  3 mL Intravenous Q12H  . tamsulosin  0.4 mg Oral QPC supper   OBJECTIVE  Filed Vitals:   01/01/14 0816 01/01/14 1434 01/01/14 2212 01/02/14 0653  BP:  100/54 107/56 103/57  Pulse:   71 70  Temp:  97.3 F (36.3 C) 98.1 F (36.7 C) 98.3 F (36.8 C)  TempSrc:  Oral Oral Oral  Resp:  18 20 16   Height:      Weight:    133 lb 6.4 oz (60.51 kg)  SpO2: 98% 96% 96% 99%    Intake/Output Summary (Last 24 hours) at 01/02/14 0905 Last data filed at 01/02/14 0750  Gross per 24 hour  Intake   1025 ml  Output    625 ml  Net    400 ml   Filed Weights   12/30/13 1638 01/01/14 0631 01/02/14 0653  Weight: 145 lb 3.5 oz (65.87 kg) 131 lb 1.6 oz (59.467 kg) 133 lb 6.4 oz (60.51 kg)   PHYSICAL EXAM  General: Pleasant, b/l hand tremor. Neuro: Alert and oriented X 3. Moves all extremities spontaneously. Psych: Normal affect. HEENT:   Normal  Neck: Supple without bruits or JVD. Lungs:  Resp regular and unlabored, CTA. Heart: RRR no s3, s4, or murmurs. Abdomen: Soft, non-tender, non-distended, BS + x 4.  Extremities: No clubbing, cyanosis or edema. DP/PT/Radials 2+ and equal bilaterally.  Accessory Clinical Findings  CBC  Recent Labs  12/30/13 1123  01/01/14 0505 01/02/14 0518  WBC 16.0*  < > 21.7* 18.8*  NEUTROABS 13.1*  --   --   --   HGB 16.2  < > 13.4 12.2*  HCT 45.8  < > 38.1* 36.4*  MCV 93.1  < > 92.5 94.1  PLT 145*  < > 141* 138*  < > = values in this interval not displayed. Basic Metabolic Panel  Recent Labs  12/31/13 0250 01/02/14 0518  NA 140 139  K 3.8 4.0  CL 100 101  CO2 25 26  GLUCOSE 160* 134*  BUN 34* 82*  CREATININE 1.71* 2.22*  CALCIUM 8.2* 8.1*   Liver Function Tests  Recent Labs  12/31/13 0250  AST 87*  ALT 12  ALKPHOS 73  BILITOT 0.5  PROT 6.8  ALBUMIN 2.7*    Recent Labs  12/30/13 1513  12/31/13 2025 01/01/14 1000 01/02/14 0518  CKTOTAL 2311*  --   --   --   --   CKMB 7.6*  --   --   --   --   TROPONINI 3.31*  < > 5.03* 4.56* 3.36*  < > = values in this interval not displayed. Radiology/Studies  Dg Chest 2 View  12/30/2013   CLINICAL DATA:  Productive cough.  Short of breath.  EXAM: CHEST  2 VIEW  COMPARISON:  DG CHEST 2 VIEW dated 05/05/2013  FINDINGS: Dual lead left subclavian pacemaker device and lead stable and intact. Postoperative changes in the mediastinum. Tortuous aorta.  Chronic interstitial changes. Retrocardiac left lower lobe airspace disease persists. No pneumothorax. Volume loss at the right lung base.  IMPRESSION: Above findings likely represent chronic changes. No definite active cardiopulmonary disease.   Electronically Signed   By: Maryclare BeanArt  Hoss M.D.   On: 12/30/2013 12:41   TELE: A-paced rhythm  ECHO 03/29/13 Study Conclusions  - Left ventricle: The cavity size was mildly dilated. Systolic function was moderately reduced. The  estimated ejection fraction was in the range of 35% to 40%. Moderate hypokinesis of the inferolateral myocardium. Mild hypokinesis of the inferior myocardium. Doppler parameters are consistent with abnormal left ventricular relaxation (grade 1 diastolic dysfunction). - Aortic valve: Mild regurgitation. - Mitral valve: Calcified annulus. Mild regurgitation. - Left atrium: The atrium was mildly dilated. - Right ventricle: Systolic pressure was increased. - Right atrium: The atrium was mildly dilated. - Tricuspid valve: Moderate regurgitation. - Pulmonary arteries: PA peak pressure: 49mm Hg (S). Impressions:  - Atrial paced rhythm with intact A-V conduction. The right ventricular systolic pressure was increased consistent with moderate pulmonary hypertension.    ASSESSMENT AND PLAN  80 year old male   1. NSTEMI - possibly due to demand ischemia in the settings of acute URI. Troponin now downtrending 3.3--> 4.1--> 5.03--->3.3, the patient is asymptomatic. Continue heparin drip. . Continue ASA, Plavix, BB, ACEI, statin. Based on the course we will plan for a cath once his Crea has improved. WBC is trending up.  2. Acute on chronic systolic CHF - LVEF 35%, he is euvolemic now, hold lasix  3. URI - most probably bronchitis - no pneumonia on CXR, on ATB, influenza should be ruled out   4. Acute on chronic CKD - secondary to poor oral intake, hold diuretics, we will give carefull iv fluids as his Crea/BUN bumped overnigt (without lasix, we will recheck)  5. Symptomatic bradycardia - syncope, [employer] interrogation this morning didn't show any significant arrhythmias other than rare PACs and no pauses  6. RLE edema - we will check for DVT   7. Hypertension - controlled now   Signed, Tobias AlexanderNELSON, Floye Fesler, H MD, [facility] HospitalFACC 01/02/2014

## 2014-01-02 NOTE — Progress Notes (Signed)
ANTICOAGULATION CONSULT NOTE - Follow Up  Pharmacy Consult for Heparin Indication: ACS/NSTEMI  Allergies  Allergen Reactions  . Pravachol     myalgias  . Simvastatin     myalgia    Patient Measurements: Height: 5' 8.9" (175 cm) (documented 12/13/13) Weight: 133 lb 6.4 oz (60.51 kg) IBW/kg (Calculated) : 70.47  Vital Signs: Temp: 97.7 F (36.5 C) (01/28 1330) Temp src: Oral (01/28 1330) BP: 106/59 mmHg (01/28 1330) Pulse Rate: 71 (01/28 1330)  Labs:  Recent Labs  12/31/13 0250  12/31/13 2025  01/01/14 0505 01/01/14 0850 01/01/14 1000 01/02/14 0518 01/02/14 1940  HGB 15.1  --   --   --  13.4  --   --  12.2*  --   HCT 43.6  --   --   --  38.1*  --   --  36.4*  --   PLT 124*  --   --   --  141*  --   --  138*  --   HEPARINUNFRC 0.58  < >  --   < >  --  0.44  --  0.73* 0.43  CREATININE 1.71*  --   --   --   --   --   --  2.22*  --   TROPONINI 5.35*  < > 5.03*  --   --   --  4.56* 3.36*  --   < > = values in this interval not displayed.  Estimated Creatinine Clearance: 21.2 ml/min (by C-G formula based on Cr of 2.22).   Assessment: [age] yoM with h/o CHF, HTN, CVA, PAD, CAD s/p MI, CABG presented 1/25 with c/o cough, low grade fever, confusion. Troponin was noted to be elevated at 3.31. Pt denied chest pain. Cardiology consulted -  Pt with NSTEMI, possible due to demand ischemia in the setting of acute infection. IV heparin chosen after Lovenox initially wanted but first dose of Lovenox was not given yet. Based on course, may plan for cath once active infection resolved. Pt weights 66kg. Pt is currently on Plavix, BB, ASA.    Heparin level slightly supratherapeutic this AM on 850 units/hr, rate subsequently reduced to 800 units/hr.  Heparin level this evening = 0.43 at 800 units/hr  Notified by nursing at ~21:15 that IV access has been lost and heparin has been stopped  CBC stable  No reported bleeding  Goal of Therapy:  Heparin Level 0.3-0.7 Monitor platelets by  anticoagulation protocol: Yes   Plan:  1) Heparin level therapeutic on 800 units/hr, heparin currently off d/t lost of IV access.  Resume at 800 units/hr when access regained.    Juliette Alcideustin Zeigler, PharmD, BCPS.   Pager: [phone] 01/02/2014 9:30 PM

## 2014-01-02 NOTE — Progress Notes (Signed)
Left upper extremity venous duplex:  No evidence of DVT or superficial thrombosis.    

## 2014-01-03 ENCOUNTER — Ambulatory Visit (INDEPENDENT_AMBULATORY_CARE_PROVIDER_SITE_OTHER): Payer: Medicare Other | Admitting: General Surgery

## 2014-01-03 DIAGNOSIS — N189 Chronic kidney disease, unspecified: Secondary | ICD-10-CM

## 2014-01-03 DIAGNOSIS — N179 Acute kidney failure, unspecified: Secondary | ICD-10-CM

## 2014-01-03 DIAGNOSIS — J111 Influenza due to unidentified influenza virus with other respiratory manifestations: Secondary | ICD-10-CM

## 2014-01-03 LAB — CBC
HEMATOCRIT: 32.7 % — AB (ref 39.0–52.0)
HEMOGLOBIN: 11.3 g/dL — AB (ref 13.0–17.0)
MCH: 32.2 pg (ref 26.0–34.0)
MCHC: 34.6 g/dL (ref 30.0–36.0)
MCV: 93.2 fL (ref 78.0–100.0)
Platelets: 122 10*3/uL — ABNORMAL LOW (ref 150–400)
RBC: 3.51 MIL/uL — ABNORMAL LOW (ref 4.22–5.81)
RDW: 14.6 % (ref 11.5–15.5)
WBC: 15.5 10*3/uL — AB (ref 4.0–10.5)

## 2014-01-03 LAB — BASIC METABOLIC PANEL
BUN: 71 mg/dL — AB (ref 6–23)
CHLORIDE: 104 meq/L (ref 96–112)
CO2: 24 meq/L (ref 19–32)
CREATININE: 1.71 mg/dL — AB (ref 0.50–1.35)
Calcium: 7.8 mg/dL — ABNORMAL LOW (ref 8.4–10.5)
GFR calc Af Amer: 41 mL/min — ABNORMAL LOW (ref >90)
GFR calc non Af Amer: 35 mL/min — ABNORMAL LOW (ref >90)
Glucose, Bld: 98 mg/dL (ref 70–99)
Potassium: 3.5 mEq/L — ABNORMAL LOW (ref 3.7–5.3)
Sodium: 139 mEq/L (ref 137–147)

## 2014-01-03 LAB — HEPARIN LEVEL (UNFRACTIONATED): Heparin Unfractionated: 0.25 IU/mL — ABNORMAL LOW (ref 0.30–0.70)

## 2014-01-03 MED ORDER — HEPARIN (PORCINE) IN NACL 100-0.45 UNIT/ML-% IJ SOLN
850.0000 [IU]/h | INTRAMUSCULAR | Status: DC
  Filled 2014-01-03: qty 250

## 2014-01-03 MED ORDER — SODIUM CHLORIDE 0.9 % IV SOLN: INTRAVENOUS | Status: AC

## 2014-01-03 MED ORDER — REGADENOSON 0.4 MG/5ML IV SOLN
0.4000 mg | Freq: Once | INTRAVENOUS | Status: DC
  Filled 2014-01-03 (×2): qty 5

## 2014-01-03 NOTE — Progress Notes (Signed)
ANTICOAGULATION CONSULT NOTE - Follow Up  Pharmacy Consult for Heparin Indication: ACS/NSTEMI  Allergies  Allergen Reactions  . Pravachol     myalgias  . Simvastatin     myalgia    Patient Measurements: Height: 5' 8.9" (175 cm) (documented 12/13/13) Weight: 132 lb 15 oz (60.3 kg) IBW/kg (Calculated) : 70.47  Vital Signs: Temp: 98 F (36.7 C) (01/29 1239) Temp src: Oral (01/29 1239) BP: 106/62 mmHg (01/29 1239) Pulse Rate: 78 (01/29 1239)  Labs:  Recent Labs  12/31/13 2025  01/01/14 0505  01/01/14 1000 01/02/14 0518 01/02/14 1940 01/03/14 0415 01/03/14 0755 01/03/14 1100  HGB  --   < > 13.4  --   --  12.2*  --  11.3*  --   --   HCT  --   --  38.1*  --   --  36.4*  --  32.7*  --   --   PLT  --   --  141*  --   --  138*  --  122*  --   --   HEPARINUNFRC  --   < >  --   < >  --  0.73* 0.43 <0.10*  --  0.25*  CREATININE  --   --   --   --   --  2.22*  --   --  1.71*  --   TROPONINI 5.03*  --   --   --  4.56* 3.36*  --   --   --   --   < > = values in this interval not displayed.  Estimated Creatinine Clearance: 27.4 ml/min (by C-G formula based on Cr of 1.71).   Assessment: [age] yoM with h/o CHF, HTN, CVA, PAD, CAD s/p MI, CABG presented 1/25 with c/o cough, low grade fever, confusion. Troponin was noted to be elevated at 3.31. Pt denied chest pain. Cardiology consulted -  Pt with NSTEMI, possible due to demand ischemia in the setting of acute infection. IV heparin chosen after Lovenox initially wanted but first dose of Lovenox was not given yet. Based on course, may plan for cath once active infection resolved. Pt weights 66kg. Pt is currently on Plavix, BB, ASA.   1/29: Noted that heparin infiltrated last night ~2100.  Heparin restarted at 2200, then stopped around 4AM for new IV acess placement.  AM heparin level drawn during this time when heparin OFF, therefore AM HL=  <0.1. Repeat level = 0.25.  RN reports v light nose bleed when pt blew nose this AM, otherwise no  bleeding.  CBC:  Hgb, plts slightly low.   Goal of Therapy:  Heparin Level 0.3-0.7 Monitor platelets by anticoagulation protocol: Yes   Plan:  1) Increase back to heparin 850 units/hr = 8.875ml/hr.  Recheck HL in 8 hours.    Haynes Hoehnolleen Mirelle Biskup, PharmD, BCPS 01/03/2014, 1:20 PM  Pager: [phone]

## 2014-01-03 NOTE — Progress Notes (Signed)
OT Cancellation Note  Patient Details Name: Fransico HimRaymond Kinnan MRN: [id number] DOB: 04-Dec-1927   Cancelled Treatment:    Reason Eval/Treat Not Completed: Other (comment).  Pt feels nauseas.  Will likely check back tomorrow.    Leeland Lovelady 01/03/2014, 2:19 PM Marica OtterMaryellen Rieley Hausman, OTR/L [phone] 01/03/2014

## 2014-01-03 NOTE — Progress Notes (Signed)
[facility] PROGRESS NOTE  [name] ZOX:[id number] DOB: 1927/03/23 DOA: 12/30/2013 PCP: Delorse Lek, MD  Assessment/Plan  URI/bronchitis/influenza A.  -Continue Tamiflu day 5/5 -  Continue bronchodilators  -d/c levofloxacin -Continue prednisone for bronchitis   NSTEMI with peak troponin 5.03 on 1/26, likely type 2, however, have not been able to exclude type 1.  Has CAD s/p CABG.  Not a good candidate for cardiac catheterization at this time due to AKI. -Appreciate cardiology assistance -Continue heparin, Plavix, metoprolol, Lipitor and aspirin  -Trop trending down  AKI, baseline creatinine 1.3, peaked on 1/28 at 2.22, likely prerenal and recovering with IVF -  Continue judicious hydration (has EF of 35%) -  RUS:  Mildly hyperechoic, no obstruction -  FENa:  0.25%, prerenal  Acute encephalopathy with a history of fall: It's unclear if he had a syncopal episode. All this is most likely secondary to the above. CT scan of head negative for acute findings.  -His mental status is at baseline at this time  Chronic systolic CHF with EF of about 81%: Even though his proBNP is elevated he appears euvolemic -  Avoid ACEI/ARB due to AKI -  Continue BB and asa  sick sinus syndrome with a pacemaker in situ: EKG is stable compared to previous.  -  Telemetry:  Paced rhythm  Penile lesion: Probably small wound but seems to be healing. Will need to be monitored as OP.   Left upper extremity swelling:  Duplex negative for DVT  Leukocytosis, likely secondary to steroids and trending down.  No evidence of PNA on CXR or UTI on UA.  Diet:  Healthy heart Access:  PIV IVF:  yes Proph:  heparin  Code Status: full Family Communication: patient alone Disposition Plan: pending improvement in kidney function, cardiac catheterization   Consultants:  Cardiology, Dr. Delton See  Procedures:  RLE duplex  LUE duplex  RUS  Antibiotics: Levaquin started on 1/25      HPI/Subjective:  Denies chest pain, SOB, cough.  States he is feeling well.     Objective: Filed Vitals:   01/02/14 1330 01/02/14 2245 01/03/14 0453 01/03/14 0745  BP: 106/59 105/49 94/47 121/65  Pulse: 71 70 70 70  Temp: 97.7 F (36.5 C) 97.7 F (36.5 C) 97.5 F (36.4 C) 97.8 F (36.6 C)  TempSrc: Oral Oral Oral Oral  Resp: 20 16 20 18   Height:      Weight:   60.3 kg (132 lb 15 oz)   SpO2: 98% 100% 98% 99%    Intake/Output Summary (Last 24 hours) at 01/03/14 1201 Last data filed at 01/03/14 1021  Gross per 24 hour  Intake    960 ml  Output   1825 ml  Net   -865 ml   Filed Weights   01/01/14 0631 01/02/14 0653 01/03/14 0453  Weight: 59.467 kg (131 lb 1.6 oz) 60.51 kg (133 lb 6.4 oz) 60.3 kg (132 lb 15 oz)    Exam:   General:  AAM, No acute distress  HEENT:  NCAT, MMM  Cardiovascular:  Distant heart sounds, nl S1, S2 no mrg, 2+ pulses, warm extremities  Respiratory:  Diminished at LLL, otherwise clear, no increased WOB  Abdomen:   NABS, soft, NT/ND  MSK:   Normal tone and bulk, LUE with 2+ edema and now ecchymosis and possible hematoma formation  Neuro:  Grossly intact  Data Reviewed: Basic Metabolic Panel:  Recent Labs Lab 12/30/13 1123 12/31/13 0250 01/02/14 0518 01/03/14 0755  NA 141 140 139 139  K 4.3 3.8 4.0 3.5*  CL 98 100 101 104  CO2 25 25 26 24   GLUCOSE 144* 160* 134* 98  BUN 22 34* 82* 71*  CREATININE 1.55* 1.71* 2.22* 1.71*  CALCIUM 8.8 8.2* 8.1* 7.8*   Liver Function Tests:  Recent Labs Lab 12/31/13 0250  AST 87*  ALT 12  ALKPHOS 73  BILITOT 0.5  PROT 6.8  ALBUMIN 2.7*   No results found for this basename: LIPASE, AMYLASE,  in the last 168 hours No results found for this basename: AMMONIA,  in the last 168 hours CBC:  Recent Labs Lab 12/30/13 1123 12/31/13 0250 01/01/14 0505 01/02/14 0518 01/03/14 0415  WBC 16.0* 18.8* 21.7* 18.8* 15.5*  NEUTROABS 13.1*  --   --   --   --   HGB 16.2 15.1 13.4 12.2* 11.3*  HCT  45.8 43.6 38.1* 36.4* 32.7*  MCV 93.1 92.8 92.5 94.1 93.2  PLT 145* 124* 141* 138* 122*   Cardiac Enzymes:  Recent Labs Lab 12/30/13 1513  12/31/13 0905 12/31/13 1424 12/31/13 2025 01/01/14 1000 01/02/14 0518  CKTOTAL 2311*  --   --   --   --   --   --   CKMB 7.6*  --   --   --   --   --   --   TROPONINI 3.31*  < > 4.20* 4.68* 5.03* 4.56* 3.36*  < > = values in this interval not displayed. BNP (last 3 results)  Recent Labs  05/04/13 1527 05/05/13 0515 12/30/13 1123  PROBNP 6583.0* 8553.0* 16965.0*   CBG: No results found for this basename: GLUCAP,  in the last 168 hours  No results found for this or any previous visit (from the past 240 hour(s)).   Studies: [facility]  01/02/2014   CLINICAL DATA:  Acute kidney injury  EXAM: RENAL/URINARY TRACT ULTRASOUND COMPLETE  COMPARISON:  CT abdomen pelvis dated 04/06/2012  FINDINGS: Right Kidney:  Length: 8.9 cm. Mildly echogenic renal parenchyma. No mass or hydronephrosis.  Left Kidney:  Length: 10.0 cm. Mildly echogenic renal parenchyma. No mass or hydronephrosis.  Bladder:  Within normal limits.  Additional comments:  Mild prostatomegaly.  IMPRESSION: No hydronephrosis.   Electronically Signed   By: Charline BillsSriyesh  Krishnan M.D.   On: 01/02/2014 12:12    Scheduled Meds: . amLODipine  5 mg Oral Daily  . aspirin  325 mg Oral Daily  . atorvastatin  20 mg Oral Daily  . clopidogrel  75 mg Oral Daily  . feeding supplement (ENSURE COMPLETE)  237 mL Oral BID BM  . guaiFENesin  600 mg Oral BID  . isosorbide mononitrate  60 mg Oral Daily  . metoprolol succinate  100 mg Oral Daily  . nicotine  14 mg Transdermal Daily  . oseltamivir  30 mg Oral QHS  . predniSONE  40 mg Oral Q breakfast  . [START ON 01/04/2014] regadenoson  0.4 mg Intravenous Once  . sodium chloride  3 mL Intravenous Q12H  . sodium chloride  3 mL Intravenous Q12H  . tamsulosin  0.4 mg Oral QPC supper   Continuous Infusions: . heparin 800 Units/hr (01/02/14 1100)     Principal Problem:   Acute bronchitis Active Problems:   HTN (hypertension)   Tobacco abuse   History of CVA (cerebrovascular accident)   CAD (coronary artery disease) of artery bypass graft   Sick sinus syndrome   Chronic systolic CHF (congestive heart failure)   NSTEMI (non-ST elevated myocardial infarction)   Malnutrition of moderate  degree   Influenza with respiratory manifestations   Acute on chronic kidney failure   Leukocytosis, unspecified    Time spent: 30 min    Otelia Hettinger, [facility]  [facility] Pager [phone]. If 7PM-7AM, please contact night-coverage at [email or web address], password [facility] 01/03/2014, 12:01 PM  LOS: 4 days

## 2014-01-03 NOTE — Progress Notes (Signed)
Patient left forearm--red and edematous, tender to touch, warm pack applied and d/c'd IV in arm and restarted in right forearm. SRP, RN, BSN.

## 2014-01-03 NOTE — Progress Notes (Signed)
PT Cancellation Note  Patient Details Name: [name] MRN: [id number] DOB: 03-18-1927   Cancelled Treatment:    Reason Eval/Treat Not Completed: pt having some issues with nausea. Will check back tomorrow. Thanks.    [name], MPT Pager: [phone]

## 2014-01-03 NOTE — Progress Notes (Signed)
Patient Name: [name] Date of Encounter: 01/03/2014  Principal Problem:   Acute bronchitis Active Problems:   HTN (hypertension)   Tobacco abuse   History of CVA (cerebrovascular accident)   CAD (coronary artery disease) of artery bypass graft   Sick sinus syndrome   Chronic systolic CHF (congestive heart failure)   NSTEMI (non-ST elevated myocardial infarction)   Malnutrition of moderate degree   Influenza with respiratory manifestations   Acute on chronic kidney failure   Leukocytosis, unspecified   Length of Stay: 4  SUBJECTIVE  The patient denies chest pain, SOB or palpitations.  CURRENT MEDS . amLODipine  5 mg Oral Daily  . aspirin  325 mg Oral Daily  . atorvastatin  20 mg Oral Daily  . clopidogrel  75 mg Oral Daily  . feeding supplement (ENSURE COMPLETE)  237 mL Oral BID BM  . guaiFENesin  600 mg Oral BID  . isosorbide mononitrate  60 mg Oral Daily  . metoprolol succinate  100 mg Oral Daily  . nicotine  14 mg Transdermal Daily  . oseltamivir  30 mg Oral QHS  . predniSONE  40 mg Oral Q breakfast  . sodium chloride  3 mL Intravenous Q12H  . sodium chloride  3 mL Intravenous Q12H  . tamsulosin  0.4 mg Oral QPC supper   OBJECTIVE  Filed Vitals:   01/02/14 1330 01/02/14 2245 01/03/14 0453 01/03/14 0745  BP: 106/59 105/49 94/47 121/65  Pulse: 71 70 70 70  Temp: 97.7 F (36.5 C) 97.7 F (36.5 C) 97.5 F (36.4 C) 97.8 F (36.6 C)  TempSrc: Oral Oral Oral Oral  Resp: 20 16 20 18   Height:      Weight:   132 lb 15 oz (60.3 kg)   SpO2: 98% 100% 98% 99%    Intake/Output Summary (Last 24 hours) at 01/03/14 0840 Last data filed at 01/03/14 0800  Gross per 24 hour  Intake    360 ml  Output   1575 ml  Net  -1215 ml   Filed Weights   01/01/14 0631 01/02/14 0653 01/03/14 0453  Weight: 131 lb 1.6 oz (59.467 kg) 133 lb 6.4 oz (60.51 kg) 132 lb 15 oz (60.3 kg)   PHYSICAL EXAM  General: Pleasant, b/l hand tremor. Neuro: Alert and oriented X 3. Moves all  extremities spontaneously. Psych: Normal affect. HEENT:  Normal  Neck: Supple without bruits or JVD. Lungs:  Resp regular and unlabored, CTA. Heart: RRR no s3, s4, or murmurs. Abdomen: Soft, non-tender, non-distended, BS + x 4.  Extremities: No clubbing, cyanosis or edema. DP/PT/Radials 2+ and equal bilaterally.  Accessory Clinical Findings  CBC  Recent Labs  01/02/14 0518 01/03/14 0415  WBC 18.8* 15.5*  HGB 12.2* 11.3*  HCT 36.4* 32.7*  MCV 94.1 93.2  PLT 138* 122*   Basic Metabolic Panel  Recent Labs  01/02/14 0518 01/03/14 0755  NA 139 139  K 4.0 3.5*  CL 101 104  CO2 26 24  GLUCOSE 134* 98  BUN 82* 71*  CREATININE 2.22* 1.71*  CALCIUM 8.1* 7.8*    Recent Labs  12/31/13 2025 01/01/14 1000 01/02/14 0518  TROPONINI 5.03* 4.56* 3.36*   Radiology/Studies  Dg Chest 2 View  12/30/2013   CLINICAL DATA:  Productive cough.  Short of breath.  EXAM: CHEST  2 VIEW  COMPARISON:  DG CHEST 2 VIEW dated 05/05/2013  FINDINGS: Dual lead left subclavian pacemaker device and lead stable and intact. Postoperative changes in the mediastinum. Tortuous aorta.  Chronic interstitial changes. Retrocardiac left lower lobe airspace disease persists. No pneumothorax. Volume loss at the right lung base.  IMPRESSION: Above findings likely represent chronic changes. No definite active cardiopulmonary disease.   Electronically Signed   By: Art  Hoss M.D.   On: 12/30/2013 12Maryclare Bean:41   TELE: A-paced rhythm  ECHO 03/29/13 Study Conclusions  - Left ventricle: The cavity size was mildly dilated. Systolic function was moderately reduced. The estimated ejection fraction was in the range of 35% to 40%. Moderate hypokinesis of the inferolateral myocardium. Mild hypokinesis of the inferior myocardium. Doppler parameters are consistent with abnormal left ventricular relaxation (grade 1 diastolic dysfunction). - Aortic valve: Mild regurgitation. - Mitral valve: Calcified annulus. Mild regurgitation. -  Left atrium: The atrium was mildly dilated. - Right ventricle: Systolic pressure was increased. - Right atrium: The atrium was mildly dilated. - Tricuspid valve: Moderate regurgitation. - Pulmonary arteries: PA peak pressure: 49mm Hg (S). Impressions:  - Atrial paced rhythm with intact A-V conduction. The right ventricular systolic pressure was increased consistent with moderate pulmonary hypertension.    ASSESSMENT AND PLAN  80 year old male   1. NSTEMI - possibly due to demand ischemia in the settings of acute URI. Troponin now downtrending 3.3--> 4.1--> 5.03--->3.3, the patient is asymptomatic. Continue heparin drip. . Continue ASA, Plavix, BB, ACEI, statin.  Since the patient didn't have any chest pain, we will plan for a Lexiscan nuclear stress test tomorrow.   2. Acute on chronic systolic CHF - LVEF 35%, he is euvolemic now, hold lasix  3. URI - most probably bronchitis - no pneumonia on CXR, on ATB, influenza should be ruled out   4. Acute on chronic CKD - secondary to poor oral intake, hold diuretics, we will give carefull iv fluids as his Crea/BUN bumped overnigt (without lasix, we will recheck), creatinine now improving  5. Symptomatic bradycardia - syncope, [employer] interrogation this morning didn't show any significant arrhythmias other than rare PACs and no pauses  6. RLE edema - we will check for DVT   7. Hypertension - controlled now   Signed, Tobias AlexanderNELSON, [place], H MD, [place] SpringsFACC 01/03/2014

## 2014-01-04 ENCOUNTER — Inpatient Hospital Stay (HOSPITAL_COMMUNITY)
Admit: 2014-01-04 | Discharge: 2014-01-04 | Disposition: A | Discharge status: Home / Self Care | Payer: Non-veteran care | Attending: Cardiology | Admitting: Cardiology

## 2014-01-04 ENCOUNTER — Other Ambulatory Visit: Payer: Self-pay

## 2014-01-04 ENCOUNTER — Ambulatory Visit (HOSPITAL_COMMUNITY)
Admit: 2014-01-04 | Discharge: 2014-01-04 | Disposition: A | Discharge status: Home / Self Care | Payer: Non-veteran care | Attending: Cardiology | Admitting: Cardiology

## 2014-01-04 DIAGNOSIS — J441 Chronic obstructive pulmonary disease with (acute) exacerbation: Secondary | ICD-10-CM

## 2014-01-04 DIAGNOSIS — R079 Chest pain, unspecified: Secondary | ICD-10-CM

## 2014-01-04 LAB — CBC
HCT: 30.5 % — ABNORMAL LOW (ref 39.0–52.0)
HEMOGLOBIN: 10.7 g/dL — AB (ref 13.0–17.0)
MCH: 32.1 pg (ref 26.0–34.0)
MCHC: 35.1 g/dL (ref 30.0–36.0)
MCV: 91.6 fL (ref 78.0–100.0)
Platelets: 116 10*3/uL — ABNORMAL LOW (ref 150–400)
RBC: 3.33 MIL/uL — ABNORMAL LOW (ref 4.22–5.81)
RDW: 14.3 % (ref 11.5–15.5)
WBC: 13.3 10*3/uL — ABNORMAL HIGH (ref 4.0–10.5)

## 2014-01-04 LAB — BASIC METABOLIC PANEL
BUN: 63 mg/dL — AB (ref 6–23)
CALCIUM: 8 mg/dL — AB (ref 8.4–10.5)
CO2: 24 meq/L (ref 19–32)
Chloride: 106 mEq/L (ref 96–112)
Creatinine, Ser: 1.73 mg/dL — ABNORMAL HIGH (ref 0.50–1.35)
GFR calc Af Amer: 40 mL/min — ABNORMAL LOW (ref >90)
GFR, EST NON AFRICAN AMERICAN: 34 mL/min — AB (ref >90)
GLUCOSE: 107 mg/dL — AB (ref 70–99)
Potassium: 3.9 mEq/L (ref 3.7–5.3)
Sodium: 141 mEq/L (ref 137–147)

## 2014-01-04 MED ORDER — PREDNISONE 20 MG PO TABS
30.0000 mg | ORAL_TABLET | Freq: Every day | ORAL | Status: DC
  Administered 2014-01-05: 30 mg via ORAL
  Filled 2014-01-04 (×2): qty 1

## 2014-01-04 MED ORDER — SODIUM CHLORIDE 0.9 % IV SOLN
INTRAVENOUS | Status: DC
  Administered 2014-01-04: 17:00:00 via INTRAVENOUS

## 2014-01-04 MED ORDER — TECHNETIUM TC 99M SESTAMIBI GENERIC - CARDIOLITE
10.0000 | Freq: Once | INTRAVENOUS | Status: AC | PRN
  Administered 2014-01-04: 10 via INTRAVENOUS

## 2014-01-04 MED ORDER — REGADENOSON 0.4 MG/5ML IV SOLN
INTRAVENOUS | Status: AC
  Filled 2014-01-04: qty 5

## 2014-01-04 MED ORDER — REGADENOSON 0.4 MG/5ML IV SOLN
0.4000 mg | Freq: Once | INTRAVENOUS | Status: AC
  Administered 2014-01-04: 0.4 mg via INTRAVENOUS
  Filled 2014-01-04: qty 5

## 2014-01-04 MED ORDER — TECHNETIUM TC 99M SESTAMIBI GENERIC - CARDIOLITE
30.0000 | Freq: Once | INTRAVENOUS | Status: AC | PRN
  Administered 2014-01-04: 30 via INTRAVENOUS

## 2014-01-04 MED ORDER — SODIUM CHLORIDE 0.9 % IV SOLN: 250.0000 mL | INTRAVENOUS | Status: DC | PRN

## 2014-01-04 NOTE — Progress Notes (Signed)
[facility] PROGRESS NOTE  Rook Maue ZOX:[id number] DOB: 04/03/1927 DOA: 12/30/2013 PCP: Delorse Lek, MD  Assessment/Plan  URI/bronchitis/influenza A, s/p tamiflu.  Also being treated for acute copd exacerbation.   -  Continue bronchodilators  - Decrease prednisone to 30mg  tomorrow and plan for Yvetta Drotar taper  NSTEMI with peak troponin 5.03 on 1/26, likely type 2, however, have not been able to exclude type 1.  Has CAD s/p CABG.  Not a good candidate for cardiac catheterization at this time due to AKI. -Appreciate cardiology assistance -Continue Plavix, metoprolol, Lipitor and aspirin  - f/u NM stress test   AKI, baseline creatinine 1.3, peaked on 1/28 at 2.22, likely prerenal and improved initially with IVF, now stagnated at 1.7 -  Continue judicious hydration (has EF of 35%) -  RUS:  Mildly hyperechoic, no obstruction -  FENa:  0.25%, prerenal -  Repeat creatinine in AM  Acute encephalopathy with a history of fall: It's unclear if he had a syncopal episode. All this is most likely secondary to the above. CT scan of head negative for acute findings.  -His mental status is at baseline at this time  Chronic systolic CHF with EF of about 81%: Even though his proBNP is elevated he appears euvolemic -  Avoid ACEI/ARB due to AKI -  Continue BB and asa  sick sinus syndrome with a pacemaker in situ: EKG is stable compared to previous.  -  Telemetry:  Paced rhythm  Penile lesion: Probably small wound but seems to be healing. Will need to be monitored as OP.   Left upper extremity swelling:  Duplex negative for DVT  Leukocytosis, likely secondary to steroids and trending down.  No evidence of PNA on CXR or UTI on UA.  Normocytic anemia, hemoglobin stable near 10.7-11mg /dl.  Repeat in 2 weeks as outpatient and further work up for anemia if persistent and not already complete.  Thrombocytopenia, mild, likely due to acute illness and may have had some consumption from left arm  ecchymosis.   -  Repeat in 2 weeks as outpatient -  Monitor for bleeding  Diet:  Healthy heart Access:  PIV IVF:  yes Proph:  heparin  Code Status: full Family Communication: patient alone Disposition Plan: pending improvement in kidney function, home tomorrow morning with home health services   Consultants:  Cardiology, Dr. Delton See  Procedures:  RLE duplex  LUE duplex  RUS  Antibiotics: Levaquin started on 1/25 > 1/28     HPI/Subjective:  Denies chest pain, SOB, cough.  States he is feeling well.     Objective: Filed Vitals:   01/03/14 2146 01/04/14 0513 01/04/14 1231 01/04/14 1431  BP: 104/59 120/61 133/57 110/48  Pulse: 71 71 70 64  Temp: 98.2 F (36.8 C) 98.3 F (36.8 C) 97.7 F (36.5 C) 97.9 F (36.6 C)  TempSrc: Oral Oral Oral Oral  Resp: 16 18 18 19   Height:      Weight:  61.462 kg (135 lb 8 oz)    SpO2: 99% 99% 100% 100%    Intake/Output Summary (Last 24 hours) at 01/04/14 1457 Last data filed at 01/04/14 1400  Gross per 24 hour  Intake 829.25 ml  Output    975 ml  Net -145.75 ml   Filed Weights   01/02/14 0653 01/03/14 0453 01/04/14 0513  Weight: 60.51 kg (133 lb 6.4 oz) 60.3 kg (132 lb 15 oz) 61.462 kg (135 lb 8 oz)    Exam:   General:  AAM, No acute distress  HEENT:  NCAT, MMM  Cardiovascular:  Distant heart sounds, nl S1, S2 no mrg, 2+ pulses, warm extremities  Respiratory:  Diminished at LLL, otherwise clear, no increased WOB  Abdomen:   NABS, soft, NT/ND  MSK:   Normal tone and bulk, LUE with 1+ edema with resolving ecchymosis   Neuro:  Grossly intact  Data Reviewed: Basic Metabolic Panel:  Recent Labs Lab 12/30/13 1123 12/31/13 0250 01/02/14 0518 01/03/14 0755 01/04/14 0338  NA 141 140 139 139 141  K 4.3 3.8 4.0 3.5* 3.9  CL 98 100 101 104 106  CO2 25 25 26 24 24   GLUCOSE 144* 160* 134* 98 107*  BUN 22 34* 82* 71* 63*  CREATININE 1.55* 1.71* 2.22* 1.71* 1.73*  CALCIUM 8.8 8.2* 8.1* 7.8* 8.0*   Liver  Function Tests:  Recent Labs Lab 12/31/13 0250  AST 87*  ALT 12  ALKPHOS 73  BILITOT 0.5  PROT 6.8  ALBUMIN 2.7*   No results found for this basename: LIPASE, AMYLASE,  in the last 168 hours No results found for this basename: AMMONIA,  in the last 168 hours CBC:  Recent Labs Lab 12/30/13 1123 12/31/13 0250 01/01/14 0505 01/02/14 0518 01/03/14 0415 01/04/14 0338  WBC 16.0* 18.8* 21.7* 18.8* 15.5* 13.3*  NEUTROABS 13.1*  --   --   --   --   --   HGB 16.2 15.1 13.4 12.2* 11.3* 10.7*  HCT 45.8 43.6 38.1* 36.4* 32.7* 30.5*  MCV 93.1 92.8 92.5 94.1 93.2 91.6  PLT 145* 124* 141* 138* 122* 116*   Cardiac Enzymes:  Recent Labs Lab 12/30/13 1513  12/31/13 0905 12/31/13 1424 12/31/13 2025 01/01/14 1000 01/02/14 0518  CKTOTAL 2311*  --   --   --   --   --   --   CKMB 7.6*  --   --   --   --   --   --   TROPONINI 3.31*  < > 4.20* 4.68* 5.03* 4.56* 3.36*  < > = values in this interval not displayed. BNP (last 3 results)  Recent Labs  05/04/13 1527 05/05/13 0515 12/30/13 1123  PROBNP 6583.0* 8553.0* 16965.0*   CBG: No results found for this basename: GLUCAP,  in the last 168 hours  No results found for this or any previous visit (from the past 240 hour(s)).   Studies: No results found.  Scheduled Meds: . amLODipine  5 mg Oral Daily  . aspirin  325 mg Oral Daily  . atorvastatin  20 mg Oral Daily  . clopidogrel  75 mg Oral Daily  . feeding supplement (ENSURE COMPLETE)  237 mL Oral BID BM  . guaiFENesin  600 mg Oral BID  . isosorbide mononitrate  60 mg Oral Daily  . metoprolol succinate  100 mg Oral Daily  . nicotine  14 mg Transdermal Daily  . predniSONE  40 mg Oral Q breakfast  . regadenoson  0.4 mg Intravenous Once  . sodium chloride  3 mL Intravenous Q12H  . sodium chloride  3 mL Intravenous Q12H  . tamsulosin  0.4 mg Oral QPC supper   Continuous Infusions:    Principal Problem:   Acute bronchitis Active Problems:   HTN (hypertension)   Tobacco  abuse   History of CVA (cerebrovascular accident)   CAD (coronary artery disease) of artery bypass graft   Sick sinus syndrome   Chronic systolic CHF (congestive heart failure)   NSTEMI (non-ST elevated myocardial infarction)   Malnutrition of moderate degree  Influenza with respiratory manifestations   Acute on chronic kidney failure   Leukocytosis, unspecified    Time spent: 30 min    Mattison Stuckey, [facility]MACKENZIE  [facility] Pager [phone]. If 7PM-7AM, please contact night-coverage at [email or web address], password [email or web address] 01/04/2014, 2:57 PM  LOS: 5 days

## 2014-01-04 NOTE — Progress Notes (Signed)
Nuc stress test reviewed, was abnormal: "Abnormal lexiscan myoview consistent with inferolateral scar with partial reversibility. There is LV systolic dysfunction with inferior wall motion abnormality. EF 41%."  Would continue medical therapy for now including ASA, Plavix, BB, statin. If no objection from primary team for other reasons, would probably recommend to decrease ASA to 81mg  given that he's also on Plavix. Per discussion with Dr. Delton SeeNelson, he may be a candidate for discharge to come back for cath at a later time for cath if Cr improves. Pt made aware of result. Kaleisha Bhargava PA-C

## 2014-01-04 NOTE — Progress Notes (Signed)
PT Cancellation Note  Patient Details Name: [name] MRN: [id number] DOB: 04/19/1927   Cancelled Treatment:    Reason Eval/Treat Not Completed: Patient at procedure or test/unavailable Pt at cardiac stress test.   [name],[name] 01/04/2014, 9:13 AM [name], PT, DPT 01/04/2014 Pager: [phone]

## 2014-01-04 NOTE — Evaluation (Signed)
Occupational Therapy Evaluation Patient Details Name: [name] MRN: [id number] DOB: [birth year] Today's Date: 01/04/2014 Time: 5784-6962 OT Time Calculation (min): 26 min  OT Assessment / Plan / Recommendation History of present illness pt was found on floor by friend and dx'd with NSTEMI.  Pt has cardiac history including CAD, CABG, and CHF.   Clinical Impression   Pt was admitted for the above.  He will benefit from skilled OT to increase safety and independence with ADLs with mod I level goals in acute.  Prior to admission, pt was independent.    OT Assessment  Patient needs continued OT Services    Follow Up Recommendations  Home health OT (depending upon progress)    Barriers to Discharge      Equipment Recommendations  None recommended by OT    Recommendations for Other Services    Frequency  Min 2X/week    Precautions / Restrictions Precautions Precautions: Fall Restrictions Weight Bearing Restrictions: No   Pertinent Vitals/Pain No c/o pain    ADL  Grooming: Set up Where Assessed - Grooming: Unsupported sitting Upper Body Bathing: Set up Where Assessed - Upper Body Bathing: Unsupported sitting Lower Body Bathing: Supervision/safety Where Assessed - Lower Body Bathing: Unsupported sit to stand Upper Body Dressing: Set up Where Assessed - Upper Body Dressing: Unsupported sitting Lower Body Dressing: Supervision/safety Where Assessed - Lower Body Dressing: Unsupported sit to stand Toilet Transfer: Simulated;Min guard Toilet Transfer Method: Sit to stand Equipment Used:  (none) Transfers/Ambulation Related to ADLs: pt moves quickly:  ambulated to sink and back to bed.  No LOB, min guard for safety due to speed    OT Diagnosis: Generalized weakness  OT Problem List: Decreased activity tolerance;Decreased strength OT Treatment Interventions: Self-care/ADL training;DME and/or AE instruction;Patient/family education   OT Goals(Current goals can be found in the  care plan section) Acute Rehab OT Goals OT Goal Formulation: With patient Time For Goal Achievement: 01/18/14 Potential to Achieve Goals: Good ADL Goals Pt Will Transfer to Toilet: with modified independence;ambulating (high commode) Additional ADL Goal #1: pt will gather clothes at mod I level and complete adl (at mod I)  Visit Information  Last OT Received On: 01/04/14 Reason Eval/Treat Not Completed: Patient at procedure or test/ unavailable History of Present Illness: pt was found on floor by friend and dx'd with NSTEMI.  Pt has cardiac history including CAD, CABG, and CHF.       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  Lost wife 8 months ago after 59 years of marriage Communication Communication: No difficulties         Vision/Perception     Copywriter, advertising Arousal/Alertness: Awake/alert Behavior During Therapy: WFL for tasks assessed/performed Overall Cognitive Status: Within Functional Limits for tasks assessed    Extremity/Trunk Assessment Upper Extremity Assessment Upper Extremity Assessment: LUE deficits/detail LUE Deficits / Details: edema throughout arm;  able to lift arm to 80      Mobility Bed Mobility Overal bed mobility: Independent Transfers Overall transfer level: Needs assistance Equipment used: None Transfers: Sit to/from Stand Sit to Stand: Supervision     Exercise     Balance     End of  Session OT - End of Session Activity Tolerance: Patient tolerated treatment well Patient left: in bed;with call bell/phone within reach;with bed alarm set  GO      Aubrii Sharpless 01/04/2014, 2:02 PM Marica OtterMaryellen Kellin Bartling, OTR/L [phone] 01/04/2014

## 2014-01-04 NOTE — Progress Notes (Signed)
Patient Name: [name] Date of Encounter: 01/04/2014  Principal Problem:   Acute bronchitis Active Problems:   HTN (hypertension)   Tobacco abuse   History of CVA (cerebrovascular accident)   CAD (coronary artery disease) of artery bypass graft   Sick sinus syndrome   Chronic systolic CHF (congestive heart failure)   NSTEMI (non-ST elevated myocardial infarction)   Malnutrition of moderate degree   Influenza with respiratory manifestations   Acute on chronic kidney failure   Leukocytosis, unspecified   Length of Stay: 5  SUBJECTIVE  The patient denies chest pain, SOB or palpitations.  CURRENT MEDS . amLODipine  5 mg Oral Daily  . aspirin  325 mg Oral Daily  . atorvastatin  20 mg Oral Daily  . clopidogrel  75 mg Oral Daily  . feeding supplement (ENSURE COMPLETE)  237 mL Oral BID BM  . guaiFENesin  600 mg Oral BID  . isosorbide mononitrate  60 mg Oral Daily  . metoprolol succinate  100 mg Oral Daily  . nicotine  14 mg Transdermal Daily  . predniSONE  40 mg Oral Q breakfast  . regadenoson  0.4 mg Intravenous Once  . sodium chloride  3 mL Intravenous Q12H  . sodium chloride  3 mL Intravenous Q12H  . tamsulosin  0.4 mg Oral QPC supper   OBJECTIVE  Filed Vitals:   01/03/14 1239 01/03/14 1353 01/03/14 2146 01/04/14 0513  BP: 106/62 105/51 104/59 120/61  Pulse: 78 72 71 71  Temp: 98 F (36.7 C) 97.6 F (36.4 C) 98.2 F (36.8 C) 98.3 F (36.8 C)  TempSrc: Oral Oral Oral Oral  Resp: 18 18 16 18   Height:      Weight:    135 lb 8 oz (61.462 kg)  SpO2: 100% 100% 99% 99%    Intake/Output Summary (Last 24 hours) at 01/04/14 0839 Last data filed at 01/04/14 0700  Gross per 24 hour  Intake 826.25 ml  Output    925 ml  Net -98.75 ml   Filed Weights   01/02/14 0653 01/03/14 0453 01/04/14 0513  Weight: 133 lb 6.4 oz (60.51 kg) 132 lb 15 oz (60.3 kg) 135 lb 8 oz (61.462 kg)   PHYSICAL EXAM  General: Pleasant, b/l hand tremor. Neuro: Alert and oriented X 3.  Moves all extremities spontaneously. Psych: Normal affect. HEENT:  Normal  Neck: Supple without bruits or JVD. Lungs:  Resp regular and unlabored, CTA. Heart: RRR no s3, s4, or murmurs. Abdomen: Soft, non-tender, non-distended, BS + x 4.  Extremities: No clubbing, cyanosis or edema. DP/PT/Radials 2+ and equal bilaterally.  Accessory Clinical Findings  CBC  Recent Labs  01/03/14 0415 01/04/14 0338  WBC 15.5* 13.3*  HGB 11.3* 10.7*  HCT 32.7* 30.5*  MCV 93.2 91.6  PLT 122* 116*   Basic Metabolic Panel  Recent Labs  01/03/14 0755 01/04/14 0338  NA 139 141  K 3.5* 3.9  CL 104 106  CO2 24 24  GLUCOSE 98 107*  BUN 71* 63*  CREATININE 1.71* 1.73*  CALCIUM 7.8* 8.0*    Recent Labs  01/01/14 1000 01/02/14 0518  TROPONINI 4.56* 3.36*   Radiology/Studies  Dg Chest 2 View  12/30/2013   CLINICAL DATA:  Productive cough.  Short of breath.  EXAM: CHEST  2 VIEW  COMPARISON:  DG CHEST 2 VIEW dated 05/05/2013  FINDINGS: Dual lead left subclavian pacemaker device and lead stable and intact. Postoperative changes in the mediastinum. Tortuous aorta.  Chronic interstitial changes. Retrocardiac left  lower lobe airspace disease persists. No pneumothorax. Volume loss at the right lung base.  IMPRESSION: Above findings likely represent chronic changes. No definite active cardiopulmonary disease.   Electronically Signed   By: Maryclare Bean M.D.   On: 12/30/2013 12:41   TELE: A-paced rhythm  ECHO 03/29/13 Study Conclusions  - Left ventricle: The cavity size was mildly dilated. Systolic function was moderately reduced. The estimated ejection fraction was in the range of 35% to 40%. Moderate hypokinesis of the inferolateral myocardium. Mild hypokinesis of the inferior myocardium. Doppler parameters are consistent with abnormal left ventricular relaxation (grade 1 diastolic dysfunction). - Aortic valve: Mild regurgitation. - Mitral valve: Calcified annulus. Mild regurgitation. - Left  atrium: The atrium was mildly dilated. - Right ventricle: Systolic pressure was increased. - Right atrium: The atrium was mildly dilated. - Tricuspid valve: Moderate regurgitation. - Pulmonary arteries: PA peak pressure: 49mm Hg (S). Impressions:  - Atrial paced rhythm with intact A-V conduction. The right ventricular systolic pressure was increased consistent with moderate pulmonary hypertension.    ASSESSMENT AND PLAN  80 year old male   1. NSTEMI - possibly due to demand ischemia in the settings of acute URI. Troponin now downtrending 3.3--> 4.1--> 5.03--->3.3, the patient is asymptomatic. Heparin stopped yesterday.  Continue ASA, Plavix, BB, ACEI, statin.  Since the patient didn't have any chest pain, we will plan for a Lexiscan nuclear stress test today.   2. Acute on chronic systolic CHF - LVEF 35%, he is euvolemic now, hold lasix  3. URI - most probably bronchitis - no pneumonia on CXR, on ATB, influenza should be ruled out   4. Acute on chronic CKD - secondary to poor oral intake, hold diuretics, we will give carefull iv fluids as his Crea/BUN bumped overnigt (without lasix, we will recheck), creatinine now improving  5. Symptomatic bradycardia - syncope, [employer] interrogation this morning didn't show any significant arrhythmias other than rare PACs and no pauses  6. RLE edema - we will check for DVT   7. Hypertension - controlled now   Signed, Tobias Alexander, H MD, [facility] 01/04/2014

## 2014-01-04 NOTE — Progress Notes (Signed)
Nuc completed without complication. Await result. Dayna Dunn PA-C  

## 2014-01-04 NOTE — Progress Notes (Signed)
Patient back from stress test transported by [facility]. Patient alert and oriented, denies any pain/distress, vitals WNL. Will continue to monitor patient.

## 2014-01-04 NOTE — Progress Notes (Signed)
OT Cancellation Note  Patient Details Name: Fransico HimRaymond Bouton MRN: [id number] DOB: 02-25-1927   Cancelled Treatment:    Reason Eval/Treat Not Completed: Patient at procedure or test/ unavailable.  Pt is still out at stress test.  Will check back if schedule permits.    Alben Jepsen 01/04/2014, 12:03 PM Marica OtterMaryellen Ily Denno, OTR/L [phone] 01/04/2014

## 2014-01-05 DIAGNOSIS — J441 Chronic obstructive pulmonary disease with (acute) exacerbation: Secondary | ICD-10-CM

## 2014-01-05 LAB — CBC
HCT: 30.8 % — ABNORMAL LOW (ref 39.0–52.0)
Hemoglobin: 10.5 g/dL — ABNORMAL LOW (ref 13.0–17.0)
MCH: 31.9 pg (ref 26.0–34.0)
MCHC: 34.1 g/dL (ref 30.0–36.0)
MCV: 93.6 fL (ref 78.0–100.0)
PLATELETS: 125 10*3/uL — AB (ref 150–400)
RBC: 3.29 MIL/uL — ABNORMAL LOW (ref 4.22–5.81)
RDW: 14.5 % (ref 11.5–15.5)
WBC: 11.4 10*3/uL — AB (ref 4.0–10.5)

## 2014-01-05 LAB — BASIC METABOLIC PANEL
BUN: 47 mg/dL — ABNORMAL HIGH (ref 6–23)
CHLORIDE: 110 meq/L (ref 96–112)
CO2: 26 mEq/L (ref 19–32)
Calcium: 7.9 mg/dL — ABNORMAL LOW (ref 8.4–10.5)
Creatinine, Ser: 1.53 mg/dL — ABNORMAL HIGH (ref 0.50–1.35)
GFR calc non Af Amer: 40 mL/min — ABNORMAL LOW (ref >90)
GFR, EST AFRICAN AMERICAN: 46 mL/min — AB (ref >90)
Glucose, Bld: 82 mg/dL (ref 70–99)
Potassium: 3.7 mEq/L (ref 3.7–5.3)
Sodium: 144 mEq/L (ref 137–147)

## 2014-01-05 MED ORDER — ALBUTEROL SULFATE HFA 108 (90 BASE) MCG/ACT IN AERS: 2.0000 | INHALATION_SPRAY | Freq: Four times a day (QID) | RESPIRATORY_TRACT | Status: DC | PRN

## 2014-01-05 MED ORDER — CLOPIDOGREL BISULFATE 75 MG PO TABS: 75.0000 mg | ORAL_TABLET | Freq: Every day | ORAL | Status: DC

## 2014-01-05 MED ORDER — ENSURE COMPLETE PO LIQD: 237.0000 mL | Freq: Two times a day (BID) | ORAL | Status: DC

## 2014-01-05 MED ORDER — ASPIRIN 81 MG PO TABS: 81.0000 mg | ORAL_TABLET | Freq: Every day | ORAL | Status: DC

## 2014-01-05 MED ORDER — PREDNISONE 10 MG PO TABS: ORAL_TABLET | ORAL | Status: DC

## 2014-01-05 NOTE — Discharge Summary (Signed)
Physician Discharge Summary  [name] WUJ:[id number] DOB: Feb 11, 1927 DOA: 12/30/2013  PCP: Delorse Lek, MD  Admit date: 12/30/2013 Discharge date: 01/05/2014  Recommendations for Outpatient Follow-up:  1. Follow up with cardiology in 1-2 weeks.  Please repeat BMP prior to procedure to verify that kidney function has returned to normal.  Please address his diuretic and ACEI at this appointment.   2. Penile lesion, follow up with PCP in 1-2 weeks for reevaluation.  Repeat CBC in 1-2 weeks to follow up leukocytosis, thrombocytopenia, and anemia.  Further work up for anemia if not already complete.    Discharge Diagnoses:  Principal Problem:   Acute bronchitis Active Problems:   HTN (hypertension)   Tobacco abuse   History of CVA (cerebrovascular accident)   CAD (coronary artery disease) of artery bypass graft   Sick sinus syndrome   Chronic systolic CHF (congestive heart failure)   NSTEMI (non-ST elevated myocardial infarction)   Malnutrition of moderate degree   Influenza with respiratory manifestations   Acute on chronic kidney failure   Leukocytosis, unspecified   COPD with acute exacerbation   Discharge Condition: stable, improved  Diet recommendation:  Regular diet  Wt Readings from Last 3 Encounters:  01/05/14 62.052 kg (136 lb 12.8 oz)  12/13/13 65.772 kg (145 lb)  05/06/13 60.1 kg (132 lb 7.9 oz)    History of present illness:  [name] is a 80 y.o. male with a past medical history of coronary artery disease, status post CABG, hypertension, chronic systolic CHF with EF of about 95% based on Echocardiogram in March of 2014, tobacco abuse, who was brought in today by his family because he was found to be lying on the floor. Patient was also confused and disoriented. He didn't know where he was and he couldn't recognize his friend, and his children. When he was brought into the emergency department he was found to have oxygen saturations Of 88-89%. He was placed on  oxygen by nasal cannula and was given breathing treatments and his mental status slowly improved. Patient tells me that he has had a cough for the last 5-6 days. Denies any expectoration. He has noticed some soreness in the chest with coughing, but denies any pressure-like sensation. Denies any nausea, vomiting. His temperature was 100.7 yesterday. Denies any chills. He does have chronic headaches. He cannot remember the events of yesterday, but thinks he may have fallen. Denies any pain in any of his joints at this time. Over the last few days, according to his son, he has had some leg swelling for which he has taken Lasix and the legs have improved. He's had poor oral intake in the last couple days. Denies any sick contacts. So due to his confusion history is limited.  Hospital Course:   URI/bronchitis/influenza A, s/p tamiflu. Also being treated for acute copd exacerbation and steroids being tapered quickly at home.  He has remained stable on room air for several days prior to discharge.    NSTEMI with peak troponin 5.03 on 1/26, likely type 2, however, have not been able to exclude type 1. Has CAD s/p CABG.  NM stress test on 1/30 demonstrated inferolateral scar with partial reversibility.  Estimated EF was 41%.  Not a good candidate for cardiac catheterization at this time due to AKI and because he is asymptomatic, he may follow up with cardiology in 1-2 weeks for cardiac catheterization per cardiology recommendations.  He should continue Plavix, metoprolol, Lipitor and aspirin   AKI, baseline creatinine 1.3,  peaked on 1/28 at 2.22, likely prerenal and improved with IVF.  RUS: Mildly hyperechoic, no obstruction.  FENa: 0.25%, prerenal.  Creatinine on the day of discharge is 1.53.  He is encouraged to drink plenty of water, but avoid salt if possible.  His ACEI and diuretic have been discontinued at this time.    Acute encephalopathy with a history of fall: It's unclear if he had a syncopal episode.  All this is most likely secondary to flu and NSTEMI. CT scan of head negative for acute findings.  His mentation returned to his baseline quickly and he has been stable for several days prior to discharge.    Chronic systolic CHF with EF of about 16%: Even though his proBNP was elevated, he appeared euvolemic to hypovolemic.  He was hydrated.  He continued BB and ASA, however, did not restart ACEI or diuretics at this time.  He should follow up with cardiology in 1 week for reevaluation.    Sick sinus syndrome with a pacemaker in situ: EKG is stable compared to previous.  Telemetry: Paced rhythm.   Penile lesion: Probably small wound but seems to be healing. Will need to be monitored as OP.   Left upper extremity swelling: Duplex negative for DVT and resolved.  May have been due to IVF.  Leukocytosis, likely secondary to steroids and trending down. No evidence of PNA on CXR or UTI on UA.   Normocytic anemia, hemoglobin stable near 10.7-11mg /dl. Repeat in 2 weeks as outpatient and further work up for anemia if persistent and not already complete.   Thrombocytopenia, mild, likely due to acute illness and may have had some consumption from left arm ecchymosis.  Nadir of 116 on 1/30 and recovering.     Consultants:  Cardiology, Dr. Delton See Procedures:  RLE duplex  LUE duplex  RUS NM stress test 1/30 Antibiotics:  Levaquin started on 1/25 > 1/28   Discharge Exam: Filed Vitals:   01/05/14 0559  BP: 157/70  Pulse: 72  Temp: 97.5 F (36.4 C)  Resp: 18   Filed Vitals:   01/04/14 1231 01/04/14 1431 01/04/14 2003 01/05/14 0559  BP: 133/57 110/48 124/58 157/70  Pulse: 70 64 77 72  Temp: 97.7 F (36.5 C) 97.9 F (36.6 C) 98 F (36.7 C) 97.5 F (36.4 C)  TempSrc: Oral Oral Oral Oral  Resp: 18 19 16 18   Height:      Weight:    62.052 kg (136 lb 12.8 oz)  SpO2: 100% 100% 100% 98%    General: AAM, No acute distress  HEENT: NCAT, MMM  Cardiovascular: Distant heart sounds, nl S1,  S2 no mrg, 2+ pulses, warm extremities  Respiratory: Diminished at LLL, otherwise clear, no increased WOB  Abdomen: NABS, soft, NT/ND  MSK: Normal tone and bulk, LUE with trace edema with resolving ecchymosis  Neuro: Grossly intact   Discharge Instructions      Discharge Orders   Future Orders Complete By Expires   (HEART FAILURE PATIENTS) Call MD:  Anytime you have any of the following symptoms: 1) 3 pound weight gain in 24 hours or 5 pounds in 1 week 2) shortness of breath, with or without a dry hacking cough 3) swelling in the hands, feet or stomach 4) if you have to sleep on extra pillows at night in order to breathe.  As directed    Call MD for:  difficulty breathing, headache or visual disturbances  As directed    Call MD for:  extreme fatigue  As directed    Call MD for:  hives  As directed    Call MD for:  persistant dizziness or light-headedness  As directed    Call MD for:  persistant nausea and vomiting  As directed    Call MD for:  severe uncontrolled pain  As directed    Call MD for:  temperature >100.4  As directed    Diet - low sodium heart healthy  As directed    Discharge instructions  As directed    Comments:     You were hospitalized with the flu and with a heart attack.  You have completed your treatment for influenza in the hospital.  Please continue a tapering dose of prednisone at home for the next 6 days.  You may use albuterol as needed for shortness of breath.  For your heart attack, please take aspirin 81mg  daily, plavix 75mg  daily and follow up with cardiology in 1-2 weeks for cardiac catheterization.  You had some problems with your kidneys during this hospitalization.  Lisinopril can worsen kidney function, so please stop this medication for now.  Also, please stop your your triamterene/HCTZ medication and instead, drink at least 1L of water every day.  If you have worsening chest pain or shortness of breath, please call 911.   Increase activity slowly  As  directed        Medication List    STOP taking these medications       lisinopril 40 MG tablet  Commonly known as:  PRINIVIL,ZESTRIL     triamterene-hydrochlorothiazide 75-50 MG per tablet  Commonly known as:  MAXZIDE      TAKE these medications       albuterol 108 (90 BASE) MCG/ACT inhaler  Commonly known as:  PROVENTIL HFA;VENTOLIN HFA  Inhale 2 puffs into the lungs every 6 (six) hours as needed for wheezing or shortness of breath.     amLODipine 10 MG tablet  Commonly known as:  NORVASC  Take 10 mg by mouth daily.     aspirin 81 MG tablet  Take 1 tablet (81 mg total) by mouth daily.     atorvastatin 40 MG tablet  Commonly known as:  LIPITOR  Take 20 mg by mouth at bedtime.     B-6 PO  Take 1 tablet by mouth daily.     clopidogrel 75 MG tablet  Commonly known as:  PLAVIX  Take 1 tablet (75 mg total) by mouth daily.     feeding supplement (ENSURE COMPLETE) Liqd  Take 237 mLs by mouth 2 (two) times daily between meals.     isosorbide mononitrate 60 MG 24 hr tablet  Commonly known as:  IMDUR  Take 60 mg by mouth daily.     metoprolol 50 MG tablet  Commonly known as:  LOPRESSOR  Take 50 mg by mouth 2 (two) times daily.     nitroGLYCERIN 0.4 MG SL tablet  Commonly known as:  NITROSTAT  Place 0.4 mg under the tongue every 5 (five) minutes as needed. For chest pain     predniSONE 10 MG tablet  Commonly known as:  DELTASONE  Take 3 tabs daily x 2 days, 2 tabs daily x 2 days, 1 tab daily x 2 days, then stop     sertraline 100 MG tablet  Commonly known as:  ZOLOFT  Take 50 mg by mouth daily.     tamsulosin 0.4 MG Caps capsule  Commonly known as:  FLOMAX  Take 0.4 mg by  mouth daily after supper.       Follow-up Information   Follow up with BURNETT,BRENT A, MD. Schedule an appointment as soon as possible for a visit in 2 weeks.   Specialty:  Family Medicine   Contact information:   [address] [place] [address] [phone]        Follow up with Tobias AlexanderNELSON, KATARINA, H, MD. Schedule an appointment as soon as possible for a visit in 1 week.   Specialty:  Cardiology   Contact information:   [address] [place] [address] [phone]        The results of significant diagnostics from this hospitalization (including imaging, microbiology, ancillary and laboratory) are listed below for reference.    Significant Diagnostic Studies: Dg Chest 2 View  12/30/2013   CLINICAL DATA:  Productive cough.  [place] Nessel of breath.  EXAM: CHEST  2 VIEW  COMPARISON:  DG CHEST 2 VIEW dated 05/05/2013  FINDINGS: Dual lead left subclavian pacemaker device and lead stable and intact. Postoperative changes in the mediastinum. Tortuous aorta.  Chronic interstitial changes. Retrocardiac left lower lobe airspace disease persists. No pneumothorax. Volume loss at the right lung base.  IMPRESSION: Above findings likely represent chronic changes. No definite active cardiopulmonary disease.   Electronically Signed   By: Maryclare BeanArt  Hoss M.D.   On: 12/30/2013 12:41   Ct Head Wo Contrast  12/30/2013   CLINICAL DATA:  Flu-like symptoms.  Left eye pain.  EXAM: CT HEAD WITHOUT CONTRAST  TECHNIQUE: Contiguous axial images were obtained from the base of the skull through the vertex without intravenous contrast.  COMPARISON:  06/27/2013  FINDINGS: Global atrophy. No mass effect, midline shift, or acute intracranial hemorrhage. Chronic ischemic changes in the periventricular white matter and left thalamus. Mastoid air cells are clear.  IMPRESSION: No acute intracranial pathology.   Electronically Signed   By: Maryclare BeanArt  Hoss M.D.   On: 12/30/2013 14:59   [place] Renal  01/02/2014   CLINICAL DATA:  Acute kidney injury  EXAM: RENAL/URINARY TRACT ULTRASOUND COMPLETE  COMPARISON:  CT abdomen pelvis dated 04/06/2012  FINDINGS: Right Kidney:  Length: 8.9 cm. Mildly echogenic renal parenchyma. No mass or hydronephrosis.  Left Kidney:  Length: 10.0 cm. Mildly echogenic renal  parenchyma. No mass or hydronephrosis.  Bladder:  Within normal limits.  Additional comments:  Mild prostatomegaly.  IMPRESSION: No hydronephrosis.   Electronically Signed   By: Charline BillsSriyesh  Krishnan M.D.   On: 01/02/2014 12:12   Nm Myocar Multi W/spect W/wall Motion / Ef  01/04/2014   EXAM: MYOCARDIAL IMAGING WITH SPECT (REST AND PHARMACOLOGIC-STRESS)  GATED LEFT VENTRICULAR WALL MOTION STUDY  LEFT VENTRICULAR EJECTION FRACTION  TECHNIQUE: Standard myocardial SPECT imaging was performed after resting intravenous injection of 10 mCi [id number] sestamibi. Subsequently, intravenous infusion of Lexiscan was performed under the supervision of the Cardiology staff. At peak effect of the drug, 30 mCi [id number] sestamibi was injected intravenously and standard myocardial SPECT imaging was performed. Quantitative gated imaging was also performed to evaluate left ventricular wall motion, and estimate left ventricular ejection fraction.  COMPARISON:  None.  FINDINGS: Quality of the study is satisfactory. Patient tolerated lexiscan without symptoms. No chest pain. EKG shows changes of paced rhythm.  Perfusion images show a medium size defect of moderate severity involving the midinferolateral and basal inferolateral segments. There is partial reversibility.  The EDV is 135 ml. The ESV is 80 ml. The EF is 41%. There is mild inferior wall hypokinesis.  IMPRESSION: Abnormal lexiscan myoview consistent with inferolateral scar with partial reversibility. There is LV systolic dysfunction with inferior wall motion abnormality.   Electronically Signed   By: Cassell Clement M.D.   On: 01/04/2014 15:11    Microbiology: No results found for this or any previous visit (from the past 240 hour(s)).   Labs: Basic Metabolic Panel:  Recent Labs Lab 12/31/13 0250 01/02/14 0518 01/03/14 0755 01/04/14 0338 01/05/14 0530  NA 140 139 139 141 144  K 3.8 4.0 3.5* 3.9 3.7  CL 100 101 104 106 110  CO2 25 26 24 24 26   GLUCOSE 160* 134* 98  107* 82  BUN 34* 82* 71* 63* 47*  CREATININE 1.71* 2.22* 1.71* 1.73* 1.53*  CALCIUM 8.2* 8.1* 7.8* 8.0* 7.9*   Liver Function Tests:  Recent Labs Lab 12/31/13 0250  AST 87*  ALT 12  ALKPHOS 73  BILITOT 0.5  PROT 6.8  ALBUMIN 2.7*   No results found for this basename: LIPASE, AMYLASE,  in the last 168 hours No results found for this basename: AMMONIA,  in the last 168 hours CBC:  Recent Labs Lab 12/30/13 1123  01/01/14 0505 01/02/14 0518 01/03/14 0415 01/04/14 0338 01/05/14 0530  WBC 16.0*  < > 21.7* 18.8* 15.5* 13.3* 11.4*  NEUTROABS 13.1*  --   --   --   --   --   --   HGB 16.2  < > 13.4 12.2* 11.3* 10.7* 10.5*  HCT 45.8  < > 38.1* 36.4* 32.7* 30.5* 30.8*  MCV 93.1  < > 92.5 94.1 93.2 91.6 93.6  PLT 145*  < > 141* 138* 122* 116* 125*  < > = values in this interval not displayed. Cardiac Enzymes:  Recent Labs Lab 12/30/13 1513  12/31/13 0905 12/31/13 1424 12/31/13 2025 01/01/14 1000 01/02/14 0518  CKTOTAL 2311*  --   --   --   --   --   --   CKMB 7.6*  --   --   --   --   --   --   TROPONINI 3.31*  < > 4.20* 4.68* 5.03* 4.56* 3.36*  < > = values in this interval not displayed. BNP: BNP (last 3 results)  Recent Labs  05/04/13 1527 05/05/13 0515 12/30/13 1123  PROBNP 6583.0* 8553.0* 16965.0*   CBG: No results found for this basename: GLUCAP,  in the last 168 hours  Time coordinating discharge: 45 minutes  Signed:  Evalynn Hankins  [facility] 01/05/2014, 8:36 AM

## 2014-01-06 ENCOUNTER — Other Ambulatory Visit: Payer: Self-pay

## 2014-01-06 ENCOUNTER — Encounter (HOSPITAL_COMMUNITY): Payer: Self-pay | Admitting: Emergency Medicine

## 2014-01-06 ENCOUNTER — Emergency Department (HOSPITAL_COMMUNITY): Payer: Medicare Other

## 2014-01-06 ENCOUNTER — Emergency Department (HOSPITAL_COMMUNITY)
Admission: EM | Admit: 2014-01-06 | Discharge: 2014-01-06 | Disposition: A | Discharge status: Home / Self Care | Payer: Medicare Other | Attending: Emergency Medicine | Admitting: Emergency Medicine

## 2014-01-06 DIAGNOSIS — Z950 Presence of cardiac pacemaker: Secondary | ICD-10-CM | POA: Insufficient documentation

## 2014-01-06 DIAGNOSIS — M549 Dorsalgia, unspecified: Secondary | ICD-10-CM | POA: Insufficient documentation

## 2014-01-06 DIAGNOSIS — I10 Essential (primary) hypertension: Secondary | ICD-10-CM | POA: Insufficient documentation

## 2014-01-06 DIAGNOSIS — IMO0002 Reserved for concepts with insufficient information to code with codable children: Secondary | ICD-10-CM | POA: Insufficient documentation

## 2014-01-06 DIAGNOSIS — I252 Old myocardial infarction: Secondary | ICD-10-CM | POA: Insufficient documentation

## 2014-01-06 DIAGNOSIS — F17200 Nicotine dependence, unspecified, uncomplicated: Secondary | ICD-10-CM | POA: Insufficient documentation

## 2014-01-06 DIAGNOSIS — I509 Heart failure, unspecified: Secondary | ICD-10-CM | POA: Insufficient documentation

## 2014-01-06 DIAGNOSIS — Z7902 Long term (current) use of antithrombotics/antiplatelets: Secondary | ICD-10-CM | POA: Insufficient documentation

## 2014-01-06 DIAGNOSIS — J441 Chronic obstructive pulmonary disease with (acute) exacerbation: Secondary | ICD-10-CM | POA: Insufficient documentation

## 2014-01-06 DIAGNOSIS — E785 Hyperlipidemia, unspecified: Secondary | ICD-10-CM | POA: Insufficient documentation

## 2014-01-06 DIAGNOSIS — Z951 Presence of aortocoronary bypass graft: Secondary | ICD-10-CM | POA: Insufficient documentation

## 2014-01-06 DIAGNOSIS — Z8673 Personal history of transient ischemic attack (TIA), and cerebral infarction without residual deficits: Secondary | ICD-10-CM | POA: Insufficient documentation

## 2014-01-06 DIAGNOSIS — Z79899 Other long term (current) drug therapy: Secondary | ICD-10-CM | POA: Insufficient documentation

## 2014-01-06 DIAGNOSIS — Z7982 Long term (current) use of aspirin: Secondary | ICD-10-CM | POA: Insufficient documentation

## 2014-01-06 LAB — BASIC METABOLIC PANEL
BUN: 36 mg/dL — ABNORMAL HIGH (ref 6–23)
CHLORIDE: 109 meq/L (ref 96–112)
CO2: 25 mEq/L (ref 19–32)
Calcium: 8 mg/dL — ABNORMAL LOW (ref 8.4–10.5)
Creatinine, Ser: 1.52 mg/dL — ABNORMAL HIGH (ref 0.50–1.35)
GFR calc non Af Amer: 40 mL/min — ABNORMAL LOW (ref >90)
GFR, EST AFRICAN AMERICAN: 47 mL/min — AB (ref >90)
Glucose, Bld: 84 mg/dL (ref 70–99)
POTASSIUM: 3.9 meq/L (ref 3.7–5.3)
SODIUM: 145 meq/L (ref 137–147)

## 2014-01-06 LAB — CBC
HEMATOCRIT: 32.3 % — AB (ref 39.0–52.0)
Hemoglobin: 11 g/dL — ABNORMAL LOW (ref 13.0–17.0)
MCH: 31.9 pg (ref 26.0–34.0)
MCHC: 34.1 g/dL (ref 30.0–36.0)
MCV: 93.6 fL (ref 78.0–100.0)
Platelets: 141 10*3/uL — ABNORMAL LOW (ref 150–400)
RBC: 3.45 MIL/uL — ABNORMAL LOW (ref 4.22–5.81)
RDW: 14.6 % (ref 11.5–15.5)
WBC: 10.8 10*3/uL — AB (ref 4.0–10.5)

## 2014-01-06 LAB — TROPONIN I
TROPONIN I: 0.62 ng/mL — AB (ref <0.30)
Troponin I: 0.6 ng/mL (ref <0.30)

## 2014-01-06 MED ORDER — ASPIRIN 81 MG PO CHEW
324.0000 mg | CHEWABLE_TABLET | Freq: Once | ORAL | Status: AC
  Administered 2014-01-06: 324 mg via ORAL
  Filled 2014-01-06: qty 4

## 2014-01-06 MED ORDER — NITROGLYCERIN 0.4 MG SL SUBL: 0.4000 mg | SUBLINGUAL_TABLET | SUBLINGUAL | Status: DC | PRN

## 2014-01-06 MED ORDER — ACETAMINOPHEN 325 MG PO TABS: 650.0000 mg | ORAL_TABLET | Freq: Once | ORAL | Status: DC

## 2014-01-06 NOTE — ED Provider Notes (Signed)
CSN: [id number]     Arrival date & time 01/06/14  1116 History   First MD Initiated Contact with Patient 01/06/14 1116     Chief Complaint  Patient presents with  . Shoulder Pain   (Consider location/radiation/quality/duration/timing/severity/associated sxs/prior Treatment) HPI Comments: 80 year old male presents with 3 hours of left back pain near his scapula. He states it started at 6 AM and ended at 9 AM (3 hours prior to arrival). He was given 4 baby aspirin at home and seems to think that this is what helped resolve his pain. He is a poor historian and is difficult to gauge if this is like his previous heart attacks or not. Family members noticed that he was short of breath/breathing faster during this time. The patient was just discharged yesterday from the hospital for a respiratory illness and had an NSTEMI. During that hospitalization he never had chest pain or back pain. He is currently asymptomatic. No leg swelling or leg pain.   Past Medical History  Diagnosis Date  . Hypertension   . Hx of CABG     1st in 2000 (Dr. Barry Dienes) x 4, LIMA to LAD, SVG to diagonal, SVG to OM4, SVG to PDA. Re-cath in May 2012 with patent LIMA to LAD, patent SVG to diagonal, patent SVG to OM with left-to-right collaterals to an occluded right.  . MI (myocardial infarction)   . TIA (transient ischemic attack)   . Hyperlipidemia   . COPD (chronic obstructive pulmonary disease)   . CVA (cerebral infarction) 07/2011    Remote left brain infarct with sensory deficits and had a left internal carotid stent placed by Dr. [name] at that time, no recurrent CVA or TIAs or CNS events.  Marland Kitchen PAD (peripheral artery disease)     Stable. Had a past RFPBG by Dr. [name] in 2009. this graft is occluded with reconstitution in the popliteal artery, which was patent. Right tibial was occluded and 2-vessel runoff on angiography done by [name] in 12/2010.  . Sick sinus syndrome     PPM implanted for this and syncope   . CHF  (congestive heart failure)     Hospitalization for CHF from 05/04/13-05/06/13. Treated medically. Had possible pneumonia with short course of antibiotics.  . Hypothyroid     On supplement  . Abdominal bruit     Loud  . Left ventricular dysfunction   . AAA (abdominal aortic aneurysm)     Has a known AAA of 3.2 x 3.4 cm by abdominal untrasound 03/27/12. Followup abdominal ultrasound on 03/29/13 showed a diameter of 3.6 cm, which is stable  . Peripheral vascular disease    Past Surgical History  Procedure Laterality Date  . Femoral to akpopliteal bypass graft    . Carotid stent Left 06/23/11    By [name]  . Coronary artery bypass graft  2000    1st in 2000 (Dr. Barry Dienes) x 4, LIMA to LAD, SVG to diagonal, SVG to OM4, SVG to PDA. Re-cath in May 2012 with patent LIMA to LAD, patent SVG to diagonal, patent SVG to OM with left-to-right collaterals to an occluded right.  . Pacemaker insertion  2003    implanted 2003 by Dr Jenne Campus with gen change (MDT ADDRL1) [facility]   Family History  Problem Relation Age of Onset  . Cancer Brother 60    oral cawncer  . Cancer Brother     prostate ca  . Hypertension Brother   . Hyperlipidemia Brother    History  Substance Use Topics  . Smoking status: Current Every Day Smoker -- 1.00 packs/day for 60 years    Types: Cigarettes  . Smokeless tobacco: Former NeurosurgeonUser    Quit date: 06/02/2011  . Alcohol Use: No    Review of Systems  Constitutional: Negative for fever.  Respiratory: Positive for shortness of breath.   Cardiovascular: Negative for chest pain.  Gastrointestinal: Negative for nausea, vomiting and abdominal pain.  Musculoskeletal: Positive for back pain.  Neurological: Negative for headaches.  All other systems reviewed and are negative.    Allergies  Pravachol and Simvastatin  Home Medications   Current Outpatient Rx  Name  Route  Sig  Dispense  Refill  . albuterol (PROVENTIL HFA;VENTOLIN HFA) 108 (90 BASE) MCG/ACT inhaler    Inhalation   Inhale 2 puffs into the lungs every 6 (six) hours as needed for wheezing or shortness of breath.   1 Inhaler   0   . amLODipine (NORVASC) 10 MG tablet   Oral   Take 10 mg by mouth daily.         Marland Kitchen. aspirin 81 MG tablet   Oral   Take 1 tablet (81 mg total) by mouth daily.   90 tablet   0   . atorvastatin (LIPITOR) 40 MG tablet   Oral   Take 20 mg by mouth at bedtime.         . clopidogrel (PLAVIX) 75 MG tablet   Oral   Take 1 tablet (75 mg total) by mouth daily.   30 tablet   0   . feeding supplement, ENSURE COMPLETE, (ENSURE COMPLETE) LIQD   Oral   Take 237 mLs by mouth 2 (two) times daily between meals.   60 Bottle   0   . isosorbide mononitrate (IMDUR) 60 MG 24 hr tablet   Oral   Take 60 mg by mouth daily.           . metoprolol (LOPRESSOR) 50 MG tablet   Oral   Take 50 mg by mouth 2 (two) times daily.         . nitroGLYCERIN (NITROSTAT) 0.4 MG SL tablet   Sublingual   Place 0.4 mg under the tongue every 5 (five) minutes as needed. For chest pain         . predniSONE (DELTASONE) 10 MG tablet      Take 3 tabs daily x 2 days, 2 tabs daily x 2 days, 1 tab daily x 2 days, then stop   12 tablet   0   . Pyridoxine HCl (B-6 PO)   Oral   Take 1 tablet by mouth daily.         . sertraline (ZOLOFT) 100 MG tablet   Oral   Take 50 mg by mouth daily.         . Tamsulosin HCl (FLOMAX) 0.4 MG CAPS   Oral   Take 0.4 mg by mouth daily after supper.           BP 133/74  Pulse 75  Temp(Src) 97.7 F (36.5 C) (Oral)  Resp 13  Ht 5\' 8"  (1.727 m)  Wt 135 lb (61.236 kg)  BMI 20.53 kg/m2  SpO2 96% Physical Exam  Nursing note and vitals reviewed. Constitutional: He is oriented to person, place, and time. He appears well-developed and well-nourished. No distress.  HENT:  Head: Normocephalic and atraumatic.  Right Ear: External ear normal.  Left Ear: External ear normal.  Nose: Nose normal.  Eyes: Right  eye exhibits no discharge. Left  eye exhibits no discharge.  Neck: Neck supple.  Cardiovascular: Normal rate, regular rhythm, normal heart sounds and intact distal pulses.   Pulmonary/Chest: Effort normal and breath sounds normal. He exhibits no tenderness.  Abdominal: Soft. There is no tenderness.  Musculoskeletal: He exhibits no edema and no tenderness.       Cervical back: He exhibits no tenderness.       Thoracic back: He exhibits no tenderness.  Neurological: He is alert and oriented to person, place, and time.  Skin: Skin is warm and dry.    ED Course  Procedures (including critical care time) Labs Review Labs Reviewed  CBC - Abnormal; Notable for the following:    WBC 10.8 (*)    RBC 3.45 (*)    Hemoglobin 11.0 (*)    HCT 32.3 (*)    Platelets 141 (*)    All other components within normal limits  BASIC METABOLIC PANEL - Abnormal; Notable for the following:    BUN 36 (*)    Creatinine, Ser 1.52 (*)    Calcium 8.0 (*)    GFR calc non Af Amer 40 (*)    GFR calc Af Amer 47 (*)    All other components within normal limits  TROPONIN I - Abnormal; Notable for the following:    Troponin I 0.60 (*)    All other components within normal limits  TROPONIN I - Abnormal; Notable for the following:    Troponin I 0.62 (*)    All other components within normal limits   Imaging Review Dg Chest 2 View  01/06/2014   CLINICAL DATA:  Upper back pain, history hypertension, coronary artery disease post MI and CABG, pacemaker, stroke, CHF, abdominal aortic aneurysm, smoking, COPD, peripheral vascular disease  EXAM: CHEST  2 VIEW  COMPARISON:  12/30/2013  FINDINGS: Left subclavian transvenous pacemaker leads project at right atrium right ventricle, unchanged.  Upper normal heart size post CABG.  Calcified tortuous thoracic aorta.  Mediastinal contours and pulmonary vascularity otherwise normal.  COPD changes with minimal chronic accentuation of basilar interstitial markings, stable.  No acute infiltrate, pleural effusion or  pneumothorax.  No acute osseous findings.  IMPRESSION: Post CABG and calcified tortuous thoracic aorta again noted.  COPD changes with chronic basilar interstitial disease changes similar to previous exams.  No definite acute abnormalities.   Electronically Signed   By: Ulyses Southward M.D.   On: 01/06/2014 13:40    EKG Interpretation   None       Date: 01/06/2014  Rate: 74  Rhythm: normal sinus rhythm  QRS Axis: left  Intervals: normal  ST/T Wave abnormalities: nonspecific T wave changes  Conduction Disutrbances:none  Narrative Interpretation: Atrially paced, nonspecific T wave changes in I, AVL  Old EKG Reviewed: unchanged   MDM   1. Back pain    Patient's symptoms concerning for atypical anginal equivalent. He is a poor historian but it seems this is similar to angina in the past. Asymptomatic since being in hospital. Given ASA here. D/w Dr. Diona Browner of Cards, recommends 4 hour troponin, and if downtrending and pain free can be discharged as the initial troponin would be still downtrending from NSTEMI in hospital. \  Second troponin 0.62, essentially unchanged. Given this, cards called again. Dr. Tresa Endo feels this is where he is settling out, and if he remains well and no CP he can be discharged with close cardiology f/u. No pain since prior to arrival. I feel he is  stable for discharge, discussed strict return precautions with family and patient.     Audree Camel, MD 01/06/14 2114

## 2014-01-06 NOTE — ED Notes (Signed)
Per EMS pt from home c/o throbbing left shoulder pain 3/10 non-radiating. Pt has hx of CABG, pacemaker, CHF pt had NSTEMI and released from hospital yesterday. Pt. Has had 324 ASA from family. Pt pain relieved prior to EMS arrival. EKG- NSR with occasional pacing.

## 2014-01-14 ENCOUNTER — Encounter: Payer: Self-pay | Admitting: Cardiology

## 2014-01-15 ENCOUNTER — Encounter: Payer: Self-pay | Admitting: Cardiology

## 2014-01-15 ENCOUNTER — Ambulatory Visit (INDEPENDENT_AMBULATORY_CARE_PROVIDER_SITE_OTHER): Payer: Medicare Other | Admitting: Cardiology

## 2014-01-15 VITALS — BP 121/61 | HR 84 | Ht 69.0 in | Wt 144.4 lb

## 2014-01-15 DIAGNOSIS — Z01810 Encounter for preprocedural cardiovascular examination: Secondary | ICD-10-CM

## 2014-01-15 DIAGNOSIS — Z7901 Long term (current) use of anticoagulants: Secondary | ICD-10-CM

## 2014-01-15 DIAGNOSIS — R9439 Abnormal result of other cardiovascular function study: Secondary | ICD-10-CM

## 2014-01-15 DIAGNOSIS — R931 Abnormal findings on diagnostic imaging of heart and coronary circulation: Secondary | ICD-10-CM

## 2014-01-15 LAB — PROTIME-INR
INR: 1.1 ratio — ABNORMAL HIGH (ref 0.8–1.0)
Prothrombin Time: 11.8 s (ref 10.2–12.4)

## 2014-01-15 NOTE — Addendum Note (Signed)
Addended by: Demetrios LollVIA, PATRICIA M on: 01/15/2014 09:11 AM   Modules accepted: Orders, Medications

## 2014-01-15 NOTE — Addendum Note (Signed)
Addended by: Lars MassonNELSON, Shataya Winkles H on: 01/15/2014 09:25 AM   Modules accepted: Orders

## 2014-01-15 NOTE — Progress Notes (Signed)
Patient ID: [name], male   DOB: 05/19/1927, 80 y.o.   MRN: [id number]    Louis Lek, MD: Primary Cardiologist: former [name] - followed at the [facility] [name] is a 80 y.o. male with a h/o symptomatic sinus bradycardia and syncope sp PPM (MDT) by Dr [name] with generator change 08/2010 by the [facility], seen by Dr [name] on 12/13/2013. His past medical history is significant for CAD, s/p CABG, PVD, CVA, COPD, and hypothyroidism.  He remains very active for his age and has no functional limitations.  Unfortunately, he continues to smoke and has not desire to quit. He is grieving his spouse who died several years ago (after 59 years and 1 day of marriage).  He appears to live alone now.  He was admitted on 12/30/2013 with acute bronchitis and NSTEMI with maximum TnI 5.03 in the settings of an acute infection. In hospital Lexiscan showed mid and basal inferolateral scar with peri-infarct ischemia.  The patient is coming 1 week after discharge, he complains of profound fatigue and DOE. He states that he felt like this before his CABG surgery.    Past Medical History  Diagnosis Date  . Hypertension   . Hx of CABG     1st in 2000 (Dr. Barry [name]) x 4, LIMA to LAD, SVG to diagonal, SVG to OM4, SVG to PDA. Re-cath in May 2012 with patent LIMA to LAD, patent SVG to diagonal, patent SVG to OM with left-to-right collaterals to an occluded right.  . MI (myocardial infarction)   . TIA (transient ischemic attack)   . Hyperlipidemia   . COPD (chronic obstructive pulmonary disease)   . CVA (cerebral infarction) 07/2011    Remote left brain infarct with sensory deficits and had a left internal carotid stent placed by Dr. Corliss [name] at that time, no recurrent CVA or TIAs or CNS events.  Marland Kitchen PAD (peripheral artery disease)     Stable. Had a past RFPBG by Dr. Arbie [name] in 2009. this graft is occluded with reconstitution in the popliteal artery, which was patent. Right tibial was occluded and 2-vessel runoff on  angiography done by Dr. Myra [name] in 12/2010.  . Sick sinus syndrome     PPM implanted for this and syncope   . CHF (congestive heart failure)     Hospitalization for CHF from 05/04/13-05/06/13. Treated medically. Had possible pneumonia with short course of antibiotics.  . Hypothyroid     On supplement  . Abdominal bruit     Loud  . Left ventricular dysfunction   . AAA (abdominal aortic aneurysm)     Has a known AAA of 3.2 x 3.4 cm by abdominal untrasound 03/27/12. Followup abdominal ultrasound on 03/29/13 showed a diameter of 3.6 cm, which is stable  . Peripheral vascular disease    Past Surgical History  Procedure Laterality Date  . Femoral to akpopliteal bypass graft    . Carotid stent Left 06/23/11    By Dr. Myra [name]  . Coronary artery bypass graft  2000    1st in 2000 (Dr. Barry [name]) x 4, LIMA to LAD, SVG to diagonal, SVG to OM4, SVG to PDA. Re-cath in May 2012 with patent LIMA to LAD, patent SVG to diagonal, patent SVG to OM with left-to-right collaterals to an occluded right.  . Pacemaker insertion  2003    implanted 2003 by Dr [name] with gen change (MDT ADDRL1) [facility]    History   Social History  . Marital Status: Widowed    Spouse Name:  N/A    Number of Children: N/A  . Years of Education: N/A   Occupational History  . Not on file.   Social History Main Topics  . Smoking status: Current Every Day Smoker -- 1.00 packs/day for 60 years    Types: Cigarettes  . Smokeless tobacco: Former Neurosurgeon    Quit date: 06/02/2011  . Alcohol Use: No  . Drug Use: No  . Sexual Activity: No   Other Topics Concern  . Not on file   Social History Narrative   Lives in [place] with spouse.  Receives most care at the [facility].    Family History  Problem Relation Age of Onset  . Cancer Brother 60    oral cawncer  . Cancer Brother     prostate ca  . Hypertension Brother   . Hyperlipidemia Brother     Allergies  Allergen Reactions  . Pravachol     myalgias  . Simvastatin       myalgia    Current Outpatient Prescriptions  Medication Sig Dispense Refill  . albuterol (PROVENTIL HFA;VENTOLIN HFA) 108 (90 BASE) MCG/ACT inhaler Inhale 2 puffs into the lungs every 6 (six) hours as needed for wheezing or shortness of breath.  1 Inhaler  0  . amLODipine (NORVASC) 10 MG tablet Take 10 mg by mouth daily.      Marland Kitchen aspirin 81 MG tablet Take 1 tablet (81 mg total) by mouth daily.  90 tablet  0  . atorvastatin (LIPITOR) 40 MG tablet Take 20 mg by mouth at bedtime.      . feeding supplement, ENSURE COMPLETE, (ENSURE COMPLETE) LIQD Take 237 mLs by mouth 2 (two) times daily between meals.  60 Bottle  0  . isosorbide mononitrate (IMDUR) 60 MG 24 hr tablet Take 60 mg by mouth daily.        . metoprolol (LOPRESSOR) 50 MG tablet Take 50 mg by mouth 2 (two) times daily.      . nitroGLYCERIN (NITROSTAT) 0.4 MG SL tablet Place 0.4 mg under the tongue every 5 (five) minutes as needed. For chest pain      . Pyridoxine HCl (B-6 PO) Take 1 tablet by mouth daily.      . sertraline (ZOLOFT) 100 MG tablet Take 50 mg by mouth daily.      . Tamsulosin HCl (FLOMAX) 0.4 MG CAPS Take 0.4 mg by mouth daily after supper.       . clopidogrel (PLAVIX) 75 MG tablet Take 1 tablet (75 mg total) by mouth daily.  30 tablet  0   No current facility-administered medications for this visit.    ROS- all systems are reviewed and negative except as per HPI  Physical Exam: Filed Vitals:   01/15/14 0824  BP: 121/61  Pulse: 84  Height: 5\' 9"  (1.753 m)  Weight: 144 lb 6.4 oz (65.499 kg)    GEN- The patient is well appearing, alert and oriented x 3 today.   Head- normocephalic, atraumatic Eyes-  Sclera clear, conjunctiva pink Ears- hearing intact Oropharynx- clear Neck- supple  Lungs- Clear to ausculation bilaterally, normal work of breathing Chest- pacemaker pocket is well healed Heart- Regular rate and rhythm  GI- soft, NT, ND, + BS Extremities- no clubbing, cyanosis, LUE - swollen from elbow down,  mildly tender and warm, good peripheral pulses MS- no significant deformity or atrophy Skin- no rash or lesion Psych- euthymic mood, full affect Neuro- strength and sensation are intact  Pacemaker interrogation- reviewed in detail today,  See PACEART report Dr Kandis CockingWeintraub's records are reviewed today  ECHO 03/29/13  Study Conclusions  - Left ventricle: The cavity size was mildly dilated. Systolic function was moderately reduced. The estimated ejection fraction was in the range of 35% to 40%. Moderate hypokinesis of the inferolateral myocardium. Mild hypokinesis of the inferior myocardium. Doppler parameters are consistent with abnormal left ventricular relaxation (grade 1 diastolic dysfunction). - Aortic valve: Mild regurgitation. - Mitral valve: Calcified annulus. Mild regurgitation. - Left atrium: The atrium was mildly dilated. - Right ventricle: Systolic pressure was increased. - Right atrium: The atrium was mildly dilated. - Tricuspid valve: Moderate regurgitation. - Pulmonary arteries: PA peak pressure: 49mm Hg (S). Impressions:  - Atrial paced rhythm with intact A-V conduction. The right ventricular systolic pressure was increased consistent with moderate pulmonary hypertension.   Lexiscan nuclear stress test: 01/04/2014 FINDINGS: Quality of the study is satisfactory. Patient tolerated lexiscan without symptoms. No chest pain. EKG shows changes of paced rhythm.  Perfusion images show a medium size defect of moderate severity involving the midinferolateral and basal inferolateral segments. There is partial reversibility.  The EDV is 135 ml. The ESV is 80 ml. The EF is 41%. There is mild inferior wall hypokinesis.  IMPRESSION: Abnormal lexiscan myoview consistent with inferolateral scar with partial reversibility. There is LV systolic dysfunction with inferior wall motion abnormality.  Cassell Clementhomas Brackbill M.D. On: 01/04/2014 15:11   ASSESSMENT AND PLAN   80 year  old male   1. NSTEMI on 12/30/2013 - in the settings of acute URI, maximum troponin 5, Positive Lexiscan nuclear stress test with mid and basal inferolateral scar with periinfarct ischemia, moderate LV dysfunction 40%.  The patient is symptomatic with similar symptoms as prior to his CABG. Based on the last cath in 2012 the stress test is suggestive of SVG to the OM occlusion.  (cath in May 2012 with patent LIMA to LAD, patent SVG to diagonal, patent SVG to OM with left-to-right collaterals to an occluded right).  We will check CMP, PT, PTT today and schedule for a cath tomorrow.   Continue ASA, Plavix, BB, ACEI, statin.  Since the patient didn't have any chest pain, we will plan for a Lexiscan nuclear stress test today.   2. Acute on chronic systolic CHF - LVEF 35%, he is euvolemic now, hold lasix   3. URI - most probably bronchitis - no pneumonia on CXR, resolved now  4.Chronic CKD - baseline Crea 1.5  5. S/P PPM for symptomatic bradycardia - followed by Dr Louis FrameAllred  6. RLE edema - no DVT on venout Duplex, it is a result of iv infiltration  7. Hypertension - controlled now    Follow up after the cath.  Tobias AlexanderELSON, Winson Eichorn, H 01/15/2014

## 2014-01-15 NOTE — Patient Instructions (Signed)
Your physician has requested that you have a cardiac catheterization. Cardiac catheterization is used to diagnose and/or treat various heart conditions. Doctors may recommend this procedure for a number of different reasons. The most common reason is to evaluate chest pain. Chest pain can be a symptom of coronary artery disease (CAD), and cardiac catheterization can show whether plaque is narrowing or blocking your heart's arteries. This procedure is also used to evaluate the valves, as well as measure the blood flow and oxygen levels in different parts of your heart. For further information please visit [email or web address] Please follow instruction sheet, as given.  Your physician recommends that you return for lab work in: today  Your physician recommends that you schedule a follow-up appointment in: after cath

## 2014-01-16 ENCOUNTER — Ambulatory Visit (HOSPITAL_COMMUNITY)
Admission: RE | Admit: 2014-01-16 | Discharge: 2014-01-17 | Disposition: A | Discharge status: Home / Self Care | Payer: Medicare Other | Source: Ambulatory Visit | Attending: Cardiovascular Disease | Admitting: Cardiovascular Disease

## 2014-01-16 ENCOUNTER — Encounter (HOSPITAL_COMMUNITY): Payer: Self-pay | Admitting: Pharmacy Technician

## 2014-01-16 ENCOUNTER — Encounter (HOSPITAL_COMMUNITY)
Admission: RE | Disposition: A | Discharge status: Home / Self Care | Payer: Medicare Other | Source: Ambulatory Visit | Attending: Cardiovascular Disease

## 2014-01-16 ENCOUNTER — Encounter (HOSPITAL_COMMUNITY): Payer: Self-pay | Admitting: General Practice

## 2014-01-16 DIAGNOSIS — Z8673 Personal history of transient ischemic attack (TIA), and cerebral infarction without residual deficits: Secondary | ICD-10-CM | POA: Insufficient documentation

## 2014-01-16 DIAGNOSIS — I2510 Atherosclerotic heart disease of native coronary artery without angina pectoris: Secondary | ICD-10-CM | POA: Insufficient documentation

## 2014-01-16 DIAGNOSIS — I509 Heart failure, unspecified: Secondary | ICD-10-CM | POA: Insufficient documentation

## 2014-01-16 DIAGNOSIS — J4489 Other specified chronic obstructive pulmonary disease: Secondary | ICD-10-CM | POA: Insufficient documentation

## 2014-01-16 DIAGNOSIS — Z7902 Long term (current) use of antithrombotics/antiplatelets: Secondary | ICD-10-CM | POA: Insufficient documentation

## 2014-01-16 DIAGNOSIS — I739 Peripheral vascular disease, unspecified: Secondary | ICD-10-CM | POA: Insufficient documentation

## 2014-01-16 DIAGNOSIS — I5042 Chronic combined systolic (congestive) and diastolic (congestive) heart failure: Secondary | ICD-10-CM | POA: Diagnosis present

## 2014-01-16 DIAGNOSIS — I25810 Atherosclerosis of coronary artery bypass graft(s) without angina pectoris: Secondary | ICD-10-CM

## 2014-01-16 DIAGNOSIS — I10 Essential (primary) hypertension: Secondary | ICD-10-CM | POA: Insufficient documentation

## 2014-01-16 DIAGNOSIS — F17200 Nicotine dependence, unspecified, uncomplicated: Secondary | ICD-10-CM | POA: Insufficient documentation

## 2014-01-16 DIAGNOSIS — I200 Unstable angina: Secondary | ICD-10-CM | POA: Diagnosis present

## 2014-01-16 DIAGNOSIS — Z79899 Other long term (current) drug therapy: Secondary | ICD-10-CM | POA: Insufficient documentation

## 2014-01-16 DIAGNOSIS — Z7982 Long term (current) use of aspirin: Secondary | ICD-10-CM | POA: Insufficient documentation

## 2014-01-16 DIAGNOSIS — I2541 Coronary artery aneurysm: Secondary | ICD-10-CM | POA: Insufficient documentation

## 2014-01-16 DIAGNOSIS — I714 Abdominal aortic aneurysm, without rupture, unspecified: Secondary | ICD-10-CM | POA: Insufficient documentation

## 2014-01-16 DIAGNOSIS — Z950 Presence of cardiac pacemaker: Secondary | ICD-10-CM | POA: Insufficient documentation

## 2014-01-16 DIAGNOSIS — Z01810 Encounter for preprocedural cardiovascular examination: Secondary | ICD-10-CM

## 2014-01-16 DIAGNOSIS — Z720 Tobacco use: Secondary | ICD-10-CM | POA: Diagnosis present

## 2014-01-16 DIAGNOSIS — I495 Sick sinus syndrome: Secondary | ICD-10-CM | POA: Diagnosis present

## 2014-01-16 DIAGNOSIS — I252 Old myocardial infarction: Secondary | ICD-10-CM | POA: Insufficient documentation

## 2014-01-16 DIAGNOSIS — J449 Chronic obstructive pulmonary disease, unspecified: Secondary | ICD-10-CM | POA: Insufficient documentation

## 2014-01-16 DIAGNOSIS — E785 Hyperlipidemia, unspecified: Secondary | ICD-10-CM | POA: Diagnosis present

## 2014-01-16 DIAGNOSIS — E039 Hypothyroidism, unspecified: Secondary | ICD-10-CM | POA: Insufficient documentation

## 2014-01-16 DIAGNOSIS — I209 Angina pectoris, unspecified: Secondary | ICD-10-CM | POA: Insufficient documentation

## 2014-01-16 HISTORY — PX: LEFT HEART CATHETERIZATION WITH CORONARY/GRAFT ANGIOGRAM: SHX5450

## 2014-01-16 HISTORY — DX: Cerebral infarction, unspecified: I63.9

## 2014-01-16 HISTORY — DX: Chronic systolic (congestive) heart failure: I50.22

## 2014-01-16 LAB — BASIC METABOLIC PANEL
BUN: 22 mg/dL (ref 6–23)
CALCIUM: 8 mg/dL — AB (ref 8.4–10.5)
CHLORIDE: 109 meq/L (ref 96–112)
CO2: 25 mEq/L (ref 19–32)
Creatinine, Ser: 1.27 mg/dL (ref 0.50–1.35)
GFR calc Af Amer: 58 mL/min — ABNORMAL LOW (ref >90)
GFR calc non Af Amer: 50 mL/min — ABNORMAL LOW (ref >90)
GLUCOSE: 83 mg/dL (ref 70–99)
Potassium: 4.5 mEq/L (ref 3.7–5.3)
SODIUM: 145 meq/L (ref 137–147)

## 2014-01-16 LAB — CBC
HEMATOCRIT: 32.3 % — AB (ref 39.0–52.0)
Hemoglobin: 11.2 g/dL — ABNORMAL LOW (ref 13.0–17.0)
MCH: 33.3 pg (ref 26.0–34.0)
MCHC: 34.7 g/dL (ref 30.0–36.0)
MCV: 96.1 fL (ref 78.0–100.0)
PLATELETS: 133 10*3/uL — AB (ref 150–400)
RBC: 3.36 MIL/uL — AB (ref 4.22–5.81)
RDW: 17 % — ABNORMAL HIGH (ref 11.5–15.5)
WBC: 12.2 10*3/uL — AB (ref 4.0–10.5)

## 2014-01-16 LAB — POCT ACTIVATED CLOTTING TIME: ACTIVATED CLOTTING TIME: 360 s

## 2014-01-16 SURGERY — LEFT HEART CATHETERIZATION WITH CORONARY/GRAFT ANGIOGRAM
Anesthesia: LOCAL

## 2014-01-16 MED ORDER — AMLODIPINE BESYLATE 10 MG PO TABS
10.0000 mg | ORAL_TABLET | Freq: Every day | ORAL | Status: DC
  Administered 2014-01-16: 21:00:00 10 mg via ORAL
  Filled 2014-01-16 (×2): qty 1

## 2014-01-16 MED ORDER — SODIUM CHLORIDE 0.9 % IJ SOLN: 3.0000 mL | Freq: Two times a day (BID) | INTRAMUSCULAR | Status: DC

## 2014-01-16 MED ORDER — HEPARIN (PORCINE) IN NACL 2-0.9 UNIT/ML-% IJ SOLN
INTRAMUSCULAR | Status: AC
  Filled 2014-01-16: qty 1000

## 2014-01-16 MED ORDER — ATORVASTATIN CALCIUM 20 MG PO TABS
20.0000 mg | ORAL_TABLET | Freq: Every day | ORAL | Status: DC
  Administered 2014-01-16: 21:00:00 20 mg via ORAL
  Filled 2014-01-16 (×2): qty 1

## 2014-01-16 MED ORDER — CLOPIDOGREL BISULFATE 75 MG PO TABS: 75.0000 mg | ORAL_TABLET | Freq: Every day | ORAL | Status: DC

## 2014-01-16 MED ORDER — ACETAMINOPHEN 325 MG PO TABS: 650.0000 mg | ORAL_TABLET | ORAL | Status: DC | PRN

## 2014-01-16 MED ORDER — BIVALIRUDIN 250 MG IV SOLR
INTRAVENOUS | Status: AC
  Filled 2014-01-16: qty 250

## 2014-01-16 MED ORDER — SERTRALINE HCL 50 MG PO TABS
50.0000 mg | ORAL_TABLET | Freq: Every day | ORAL | Status: DC
  Administered 2014-01-16: 50 mg via ORAL
  Filled 2014-01-16 (×2): qty 1

## 2014-01-16 MED ORDER — SODIUM CHLORIDE 0.9 % IV SOLN
INTRAVENOUS | Status: DC
  Administered 2014-01-16: 11:00:00 via INTRAVENOUS

## 2014-01-16 MED ORDER — ALBUTEROL SULFATE HFA 108 (90 BASE) MCG/ACT IN AERS: 2.0000 | INHALATION_SPRAY | Freq: Four times a day (QID) | RESPIRATORY_TRACT | Status: DC | PRN

## 2014-01-16 MED ORDER — HYDRALAZINE HCL 20 MG/ML IJ SOLN: 10.0000 mg | Freq: Four times a day (QID) | INTRAMUSCULAR | Status: DC | PRN

## 2014-01-16 MED ORDER — LIDOCAINE HCL (PF) 1 % IJ SOLN
INTRAMUSCULAR | Status: AC
  Filled 2014-01-16: qty 30

## 2014-01-16 MED ORDER — ASPIRIN 81 MG PO CHEW
81.0000 mg | CHEWABLE_TABLET | Freq: Every day | ORAL | Status: DC
  Filled 2014-01-16: qty 1

## 2014-01-16 MED ORDER — TAMSULOSIN HCL 0.4 MG PO CAPS
0.4000 mg | ORAL_CAPSULE | ORAL | Status: DC
  Filled 2014-01-16: qty 1

## 2014-01-16 MED ORDER — ASPIRIN 81 MG PO CHEW
81.0000 mg | CHEWABLE_TABLET | ORAL | Status: AC
  Administered 2014-01-16: 81 mg via ORAL
  Filled 2014-01-16: qty 1

## 2014-01-16 MED ORDER — ALPRAZOLAM 0.25 MG PO TABS: 0.2500 mg | ORAL_TABLET | Freq: Three times a day (TID) | ORAL | Status: DC | PRN

## 2014-01-16 MED ORDER — FAMOTIDINE IN NACL 20-0.9 MG/50ML-% IV SOLN
INTRAVENOUS | Status: AC
  Filled 2014-01-16: qty 50

## 2014-01-16 MED ORDER — ALBUTEROL SULFATE (2.5 MG/3ML) 0.083% IN NEBU: 2.5000 mg | INHALATION_SOLUTION | Freq: Four times a day (QID) | RESPIRATORY_TRACT | Status: DC | PRN

## 2014-01-16 MED ORDER — MIDAZOLAM HCL 2 MG/2ML IJ SOLN
INTRAMUSCULAR | Status: AC
  Filled 2014-01-16: qty 2

## 2014-01-16 MED ORDER — CLOPIDOGREL BISULFATE 300 MG PO TABS
ORAL_TABLET | ORAL | Status: AC
  Filled 2014-01-16: qty 2

## 2014-01-16 MED ORDER — ISOSORBIDE MONONITRATE ER 60 MG PO TB24
60.0000 mg | ORAL_TABLET | Freq: Every day | ORAL | Status: DC
  Administered 2014-01-16: 60 mg via ORAL
  Filled 2014-01-16 (×2): qty 1

## 2014-01-16 MED ORDER — SODIUM CHLORIDE 0.9 % IV SOLN: INTRAVENOUS | Status: AC

## 2014-01-16 MED ORDER — NITROGLYCERIN 0.2 MG/ML ON CALL CATH LAB
INTRAVENOUS | Status: AC
  Filled 2014-01-16: qty 1

## 2014-01-16 MED ORDER — METOPROLOL TARTRATE 50 MG PO TABS
50.0000 mg | ORAL_TABLET | Freq: Two times a day (BID) | ORAL | Status: DC
  Administered 2014-01-16: 21:00:00 50 mg via ORAL
  Filled 2014-01-16 (×3): qty 1

## 2014-01-16 MED ORDER — SODIUM CHLORIDE 0.9 % IV SOLN: 250.0000 mL | INTRAVENOUS | Status: DC | PRN

## 2014-01-16 MED ORDER — SODIUM CHLORIDE 0.9 % IJ SOLN: 3.0000 mL | INTRAMUSCULAR | Status: DC | PRN

## 2014-01-16 NOTE — Interval H&P Note (Signed)
History and Physical Interval Note:  01/16/2014 1:59 PM  [name]  has presented today for cardiac cath with the diagnosis of CAD s/p CABG, recent NSTEMI, abnormal stress myoview.  The various methods of treatment have been discussed with the patient and family. After consideration of risks, benefits and other options for treatment, the patient has consented to  Procedure(s): LEFT HEART CATHETERIZATION WITH CORONARY/GRAFT ANGIOGRAM (N/A) as a surgical intervention .  The patient's history has been reviewed, patient examined, no change in status, stable for surgery.  I have reviewed the patient's chart and labs.  Questions were answered to the patient's satisfaction.    Cath Lab Visit (complete for each Cath Lab visit)  Clinical Evaluation Leading to the Procedure:   ACS: no  Non-ACS:    Anginal Classification: CCS III  Anti-ischemic medical therapy: Maximal Therapy (2 or more classes of medications)  Non-Invasive Test Results: Intermediate-risk stress test findings: cardiac mortality 1-3%/year  Prior CABG: Previous CABG        MCALHANY,CHRISTOPHER

## 2014-01-16 NOTE — Progress Notes (Addendum)
Site area: right groin  Site Prior to Removal:  Level 0  Pressure Applied For 25 MINUTES    Minutes Beginning at 1720  Manual:   yes  Patient Status During Pull:  stable  Post Pull Groin Site:  Level 0  Post Pull Instructions Given:  yes  Post Pull Pulses Present:  yes  Dressing Applied:  yes  Comments:  Gauze pressure dressing applied at 1745, rechecked several times and remains dry and intact & site remains soft at 1900.

## 2014-01-16 NOTE — CV Procedure (Signed)
Cardiac Catheterization Operative Report  [name] [id number] 2/11/20153:24 PM Marjory Lies A, MD  Procedure Performed:  1. Left Heart Catheterization 2. Selective Coronary Angiography 3. SVG angiography 4. LIMA graft angiography 5. PTCA/bare metal stent x 1 mid body of SVG to OM  Operator: Verne Carrow, MD  Indication: 80 yo AAM with history of CAD s/p CABG, PAD, CVA, COPD with recent hospitalization for bronchitis, troponin was elevated over 5.0. Stress myoview with lateral ischemia.  Last cath in 2012 per Dr. Tresa Endo with patent IMA to LAD, patent SVG to OM with diffuse disease in body of graft, patent SVG to Diagonal with diffuse disease in body of graft, occluded SVG to RCA, occluded proximal RCA, occluded proximal LAD, occluded distal Circumflex, treated medically.                                      Procedure Details: The risks, benefits, complications, treatment options, and expected outcomes were discussed with the patient. The patient and/or family concurred with the proposed plan, giving informed consent. The patient was brought to the cath lab after IV hydration was begun and oral premedication was given. The patient was further sedated with Versed. The right groin was prepped and draped in the usual manner. Using the modified Seldinger access technique, a 5 French sheath was placed in the right femoral artery. Standard diagnostic catheters were used to perform selective coronary angiography. A LIMA catheter was used to engaged the LIMA graft. A LCB was used to engage the SVG to the Diagonal. The JR4 was used to engage the SVG to the OM. A pigtail catheter was used to measure left ventricular pressures. He was found to have a hazy, 99% stenosis in the mid body of the SVG to the OM. I elected to proceed to PCI.   PCI Note: The patient was given a weight based bolus of Angiomax and a drip was started. He was given Plavix 600 mg po x 1. When the ACT was over 200, I  passed a Cougar IC wire down the SVG to the OM. I then exchanged this wire for a 6.0 Spider distal protection device. I then pre-dilated the severe stenosis in the mid body of the SVG with a 3.0 x 12 mm balloon x 1. I then deployed a 5.0 x 12 Veriflex bare metal stent in the mid body of the SVG to the OM. There was excellent flow down the graft. The distal protection device was retrieved.   There were no immediate complications. The patient was taken to the recovery area in stable condition.   Hemodynamic Findings: Central aortic pressure: 181/68 Left ventricular pressure: 177/0/20  Angiographic Findings:  Left main: Distal 30% stenosis.   Left Anterior Descending Artery: Large caliber vessel. 70% proximal stenosis followed by 100% mid occlusion. The mid and distal vessel fills from the patent IMA graft. The Diagonal branch fills from the patent vein graft.   Circumflex Artery: Moderate caliber vessel with mid occlusion. The first OM branch is patent. The second OM branch fills from the patent vein graft.   Right Coronary Artery: Large caliber dominant vessel. The proximal vessel has 100% occlusion. The distal vessel fills from left to right collaterals. The vein graft to the distal vessel is known to be occluded.   Graft Anatomy:  SVG to RCA is occluded SVG to OM is patent with 40% ostial stenosis, diffuse aneurysmal  enlargement of entire vein graft with 40% mid body stenosis and 99% hazy mid body stenosis.  SVG to Diagonal is patent but the entire graft is aneurysmal with diffuse disease, TIMI-1 flow. Not a target for PCI LIMA to LAD is patent.   Left Ventricular Angiogram: Deferred.   Impression: 1. Severe triple vessel CAD with 3/4 patent grafts.  2. Recent NSTEMI and lateral wall ischemia on stress test secondary to severe disease in body of SVG to OM 3. Successful PTCA/bare metal stent x 1 mid body of SVG to OM  Recommendations: He will need ASA and Plavix for at least one month  but longer if he tolerates well. Continue other cardiac meds. D/C home tomorrow if stable with f/u with Dr. Delton SeeNelson in 2-3 weeks.        Complications:  None. The patient tolerated the procedure well.

## 2014-01-16 NOTE — Progress Notes (Signed)
    Subjective: swollen left arm from infiltrated IV last week.  Improving.  Hit elbow yesterday which worsened it a little.  Denies N/V, fever  Objective: Vital signs in last 24 hours:      Intake/Output from previous day:   Intake/Output this shift:    Medications Current Facility-Administered Medications  Medication Dose Route Frequency Provider Last Rate Last Dose  . 0.9 %  sodium chloride infusion  250 mL Intravenous PRN Lars MassonKatarina H Nelson, MD      . Melene Muller[START ON 01/17/2014] 0.9 %  sodium chloride infusion   Intravenous Continuous Lars MassonKatarina H Nelson, MD 50 mL/hr at 01/16/14 1113    . aspirin chewable tablet 81 mg  81 mg Oral Pre-Cath Lars MassonKatarina H Nelson, MD      . sodium chloride 0.9 % injection 3 mL  3 mL Intravenous Q12H Lars MassonKatarina H Nelson, MD      . sodium chloride 0.9 % injection 3 mL  3 mL Intravenous PRN Lars MassonKatarina H Nelson, MD        PE: General appearance: alert, cooperative and no distress Lungs: clear to auscultation bilaterally Heart: regular rate and rhythm, S1, S2 normal, no murmur, click, rub or gallop Extremities: 1+ RUE edema.  1+ left LEE.  The left arm does not appear any warmer than the right. Pulses: 2+ and symmetric Skin: Warm and dry Neurologic: Grossly normal  Lab Results:  No results found for this basename: WBC, HGB, HCT, PLT,  in the last 72 hours BMET No results found for this basename: NA, K, CL, CO2, GLUCOSE, BUN, CREATININE, CALCIUM,  in the last 72 hours PT/INR  Recent Labs  01/15/14 0923  LABPROT 11.8  INR 1.1*     Assessment/Plan   Active Problems:   * No active hospital problems. * Abnormal NST. Swollen left forearm  Plan:  The patient presents to day for coronary angiography.  Left arm is swollen from previous infiltrated IV.  The edema has progressively improved until yesterday when the patient fell and hit his elbow.  This worsened the edema a little but it continues to improve today.  No signs of fracture.  No N/V or fever.  Will  recheck CBC and white count.  Arm is tender but no ROM issues.   LOS: 0 days    [name] 01/16/2014 11:45 AM

## 2014-01-16 NOTE — H&P (View-Only) (Signed)
Patient ID: [name], male   DOB: 05/19/1927, 80 y.o.   MRN: [id number]    Louis Lek, MD: Primary Cardiologist: former [name] - followed at the [facility] [name] is a 80 y.o. male with a h/o symptomatic sinus bradycardia and syncope sp PPM (MDT) by Dr [name] with generator change 08/2010 by the [facility], seen by Dr [name] on 12/13/2013. His past medical history is significant for CAD, s/p CABG, PVD, CVA, COPD, and hypothyroidism.  He remains very active for his age and has no functional limitations.  Unfortunately, he continues to smoke and has not desire to quit. He is grieving his spouse who died several years ago (after 59 years and 1 day of marriage).  He appears to live alone now.  He was admitted on 12/30/2013 with acute bronchitis and NSTEMI with maximum TnI 5.03 in the settings of an acute infection. In hospital Lexiscan showed mid and basal inferolateral scar with peri-infarct ischemia.  The patient is coming 1 week after discharge, he complains of profound fatigue and DOE. He states that he felt like this before his CABG surgery.    Past Medical History  Diagnosis Date  . Hypertension   . Hx of CABG     1st in 2000 ([name]) x 4, LIMA to LAD, SVG to diagonal, SVG to OM4, SVG to PDA. Re-cath in May 2012 with patent LIMA to LAD, patent SVG to diagonal, patent SVG to OM with left-to-right collaterals to an occluded right.  . MI (myocardial infarction)   . TIA (transient ischemic attack)   . Hyperlipidemia   . COPD (chronic obstructive pulmonary disease)   . CVA (cerebral infarction) 07/2011    Remote left brain infarct with sensory deficits and had a left internal carotid stent placed by Dr. Corliss [name] at that time, no recurrent CVA or TIAs or CNS events.  Marland Kitchen PAD (peripheral artery disease)     Stable. Had a past RFPBG by Dr. Arbie [name] in 2009. this graft is occluded with reconstitution in the popliteal artery, which was patent. Right tibial was occluded and 2-vessel runoff on  angiography done by Dr. Myra [name] in 12/2010.  . Sick sinus syndrome     PPM implanted for this and syncope   . CHF (congestive heart failure)     Hospitalization for CHF from 05/04/13-05/06/13. Treated medically. Had possible pneumonia with short course of antibiotics.  . Hypothyroid     On supplement  . Abdominal bruit     Loud  . Left ventricular dysfunction   . AAA (abdominal aortic aneurysm)     Has a known AAA of 3.2 x 3.4 cm by abdominal untrasound 03/27/12. Followup abdominal ultrasound on 03/29/13 showed a diameter of 3.6 cm, which is stable  . Peripheral vascular disease    Past Surgical History  Procedure Laterality Date  . Femoral to akpopliteal bypass graft    . Carotid stent Left 06/23/11    By Dr. Myra [name]  . Coronary artery bypass graft  2000    1st in 2000 ([name]) x 4, LIMA to LAD, SVG to diagonal, SVG to OM4, SVG to PDA. Re-cath in May 2012 with patent LIMA to LAD, patent SVG to diagonal, patent SVG to OM with left-to-right collaterals to an occluded right.  . Pacemaker insertion  2003    implanted 2003 by Dr [name] with gen change (MDT ADDRL1) [facility]    History   Social History  . Marital Status: Widowed    Spouse Name:  N/A    Number of Children: N/A  . Years of Education: N/A   Occupational History  . Not on file.   Social History Main Topics  . Smoking status: Current Every Day Smoker -- 1.00 packs/day for 60 years    Types: Cigarettes  . Smokeless tobacco: Former Neurosurgeon    Quit date: 06/02/2011  . Alcohol Use: No  . Drug Use: No  . Sexual Activity: No   Other Topics Concern  . Not on file   Social History Narrative   Lives in [place] with spouse.  Receives most care at the [facility].    Family History  Problem Relation Age of Onset  . Cancer Brother 60    oral cawncer  . Cancer Brother     prostate ca  . Hypertension Brother   . Hyperlipidemia Brother     Allergies  Allergen Reactions  . Pravachol     myalgias  . Simvastatin       myalgia    Current Outpatient Prescriptions  Medication Sig Dispense Refill  . albuterol (PROVENTIL HFA;VENTOLIN HFA) 108 (90 BASE) MCG/ACT inhaler Inhale 2 puffs into the lungs every 6 (six) hours as needed for wheezing or shortness of breath.  1 Inhaler  0  . amLODipine (NORVASC) 10 MG tablet Take 10 mg by mouth daily.      Marland Kitchen aspirin 81 MG tablet Take 1 tablet (81 mg total) by mouth daily.  90 tablet  0  . atorvastatin (LIPITOR) 40 MG tablet Take 20 mg by mouth at bedtime.      . feeding supplement, ENSURE COMPLETE, (ENSURE COMPLETE) LIQD Take 237 mLs by mouth 2 (two) times daily between meals.  60 Bottle  0  . isosorbide mononitrate (IMDUR) 60 MG 24 hr tablet Take 60 mg by mouth daily.        . metoprolol (LOPRESSOR) 50 MG tablet Take 50 mg by mouth 2 (two) times daily.      . nitroGLYCERIN (NITROSTAT) 0.4 MG SL tablet Place 0.4 mg under the tongue every 5 (five) minutes as needed. For chest pain      . Pyridoxine HCl (B-6 PO) Take 1 tablet by mouth daily.      . sertraline (ZOLOFT) 100 MG tablet Take 50 mg by mouth daily.      . Tamsulosin HCl (FLOMAX) 0.4 MG CAPS Take 0.4 mg by mouth daily after supper.       . clopidogrel (PLAVIX) 75 MG tablet Take 1 tablet (75 mg total) by mouth daily.  30 tablet  0   No current facility-administered medications for this visit.    ROS- all systems are reviewed and negative except as per HPI  Physical Exam: Filed Vitals:   01/15/14 0824  BP: 121/61  Pulse: 84  Height: 5\' 9"  (1.753 m)  Weight: 144 lb 6.4 oz (65.499 kg)    GEN- The patient is well appearing, alert and oriented x 3 today.   Head- normocephalic, atraumatic Eyes-  Sclera clear, conjunctiva pink Ears- hearing intact Oropharynx- clear Neck- supple  Lungs- Clear to ausculation bilaterally, normal work of breathing Chest- pacemaker pocket is well healed Heart- Regular rate and rhythm  GI- soft, NT, ND, + BS Extremities- no clubbing, cyanosis, LUE - swollen from elbow down,  mildly tender and warm, good peripheral pulses MS- no significant deformity or atrophy Skin- no rash or lesion Psych- euthymic mood, full affect Neuro- strength and sensation are intact  Pacemaker interrogation- reviewed in detail today,  See PACE[facility] report Dr Kandis CockingWeintraub's records are reviewed today  ECHO 03/29/13  Study Conclusions  - Left ventricle: The cavity size was mildly dilated. Systolic function was moderately reduced. The estimated ejection fraction was in the range of 35% to 40%. Moderate hypokinesis of the inferolateral myocardium. Mild hypokinesis of the inferior myocardium. Doppler parameters are consistent with abnormal left ventricular relaxation (grade 1 diastolic dysfunction). - Aortic valve: Mild regurgitation. - Mitral valve: Calcified annulus. Mild regurgitation. - Left atrium: The atrium was mildly dilated. - Right ventricle: Systolic pressure was increased. - Right atrium: The atrium was mildly dilated. - Tricuspid valve: Moderate regurgitation. - Pulmonary arteries: PA peak pressure: 49mm Hg (S). Impressions:  - Atrial paced rhythm with intact A-V conduction. The right ventricular systolic pressure was increased consistent with moderate pulmonary hypertension.   Lexiscan nuclear stress test: 01/04/2014 FINDINGS: Quality of the study is satisfactory. Patient tolerated lexiscan without symptoms. No chest pain. EKG shows changes of paced rhythm.  Perfusion images show a medium size defect of moderate severity involving the midinferolateral and basal inferolateral segments. There is partial reversibility.  The EDV is 135 ml. The ESV is 80 ml. The EF is 41%. There is mild inferior wall hypokinesis.  IMPRESSION: Abnormal lexiscan myoview consistent with inferolateral scar with partial reversibility. There is LV systolic dysfunction with inferior wall motion abnormality.  Cassell Clementhomas Brackbill M.D. On: 01/04/2014 15:11   ASSESSMENT AND PLAN   80 year  old male   1. NSTEMI on 12/30/2013 - in the settings of acute URI, maximum troponin 5, Positive Lexiscan nuclear stress test with mid and basal inferolateral scar with periinfarct ischemia, moderate LV dysfunction 40%.  The patient is symptomatic with similar symptoms as prior to his CABG. Based on the last cath in 2012 the stress test is suggestive of SVG to the OM occlusion.  (cath in May 2012 with patent LIMA to LAD, patent SVG to diagonal, patent SVG to OM with left-to-right collaterals to an occluded right).  We will check CMP, PT, PTT today and schedule for a cath tomorrow.   Continue ASA, Plavix, BB, ACEI, statin.  Since the patient didn't have any chest pain, we will plan for a Lexiscan nuclear stress test today.   2. Acute on chronic systolic CHF - LVEF 35%, he is euvolemic now, hold lasix   3. URI - most probably bronchitis - no pneumonia on CXR, resolved now  4.Chronic CKD - baseline Crea 1.5  5. S/P PPM for symptomatic bradycardia - followed by Dr Louis FrameAllred  6. RLE edema - no DVT on venout Duplex, it is a result of iv infiltration  7. Hypertension - controlled now    Follow up after the cath.  Tobias AlexanderELSON, Vaniya Augspurger, H 01/15/2014

## 2014-01-17 ENCOUNTER — Encounter (HOSPITAL_COMMUNITY): Payer: Self-pay | Admitting: Nurse Practitioner

## 2014-01-17 DIAGNOSIS — Z01810 Encounter for preprocedural cardiovascular examination: Secondary | ICD-10-CM

## 2014-01-17 DIAGNOSIS — I25810 Atherosclerosis of coronary artery bypass graft(s) without angina pectoris: Secondary | ICD-10-CM

## 2014-01-17 DIAGNOSIS — Z48812 Encounter for surgical aftercare following surgery on the circulatory system: Secondary | ICD-10-CM

## 2014-01-17 LAB — BASIC METABOLIC PANEL
BUN: 21 mg/dL (ref 6–23)
CO2: 24 mEq/L (ref 19–32)
Calcium: 7.7 mg/dL — ABNORMAL LOW (ref 8.4–10.5)
Chloride: 107 mEq/L (ref 96–112)
Creatinine, Ser: 1.32 mg/dL (ref 0.50–1.35)
GFR calc Af Amer: 55 mL/min — ABNORMAL LOW (ref >90)
GFR, EST NON AFRICAN AMERICAN: 48 mL/min — AB (ref >90)
Glucose, Bld: 80 mg/dL (ref 70–99)
POTASSIUM: 4.2 meq/L (ref 3.7–5.3)
SODIUM: 141 meq/L (ref 137–147)

## 2014-01-17 LAB — CBC
HCT: 29.3 % — ABNORMAL LOW (ref 39.0–52.0)
Hemoglobin: 10 g/dL — ABNORMAL LOW (ref 13.0–17.0)
MCH: 32.7 pg (ref 26.0–34.0)
MCHC: 34.1 g/dL (ref 30.0–36.0)
MCV: 95.8 fL (ref 78.0–100.0)
Platelets: 125 10*3/uL — ABNORMAL LOW (ref 150–400)
RBC: 3.06 MIL/uL — ABNORMAL LOW (ref 4.22–5.81)
RDW: 17 % — AB (ref 11.5–15.5)
WBC: 9.9 10*3/uL (ref 4.0–10.5)

## 2014-01-17 MED ORDER — METOPROLOL SUCCINATE ER 100 MG PO TB24
100.0000 mg | ORAL_TABLET | Freq: Every day | ORAL | Status: DC
  Administered 2014-01-17: 100 mg via ORAL
  Filled 2014-01-17 (×2): qty 1

## 2014-01-17 MED ORDER — METOPROLOL SUCCINATE ER 100 MG PO TB24: 100.0000 mg | ORAL_TABLET | Freq: Every day | ORAL | Status: DC

## 2014-01-17 MED ORDER — CLOPIDOGREL BISULFATE 75 MG PO TABS: 75.0000 mg | ORAL_TABLET | Freq: Every day | ORAL | Status: DC

## 2014-01-17 MED FILL — Sodium Chloride IV Soln 0.9%: INTRAVENOUS | Qty: 50 | Status: AC

## 2014-01-17 NOTE — Progress Notes (Signed)
Right groin duplex completed.  No evidence of pseudoaneurysm or A-V fistula.

## 2014-01-17 NOTE — Progress Notes (Signed)
Patient Name: [name] Date of Encounter: 01/17/2014   Principal Problem:   Unstable angina Active Problems:   CAD (coronary artery disease) of artery bypass graft   HTN (hypertension)   Tobacco abuse   Chronic systolic CHF (congestive heart failure)   Hyperlipidemia   Sick sinus syndrome   History of CVA (cerebrovascular accident)   SUBJECTIVE  No chest pain or sob overnight.  No groin complaints.  CURRENT MEDS . amLODipine  10 mg Oral QHS  . aspirin  81 mg Oral Daily  . atorvastatin  20 mg Oral QHS  . clopidogrel  75 mg Oral Q breakfast  . isosorbide mononitrate  60 mg Oral QHS  . metoprolol  50 mg Oral BID  . sertraline  50 mg Oral QHS  . tamsulosin  0.4 mg Oral PC supper    OBJECTIVE  Filed Vitals:   01/16/14 2122 01/17/14 0014 01/17/14 0403 01/17/14 0736  BP: 164/67 118/49 102/42 141/65  Pulse: 77 75 69 73  Temp:  98.2 F (36.8 C) 97.7 F (36.5 C) 98.6 F (37 C)  TempSrc:  Oral Oral Oral  Resp:  18 20 18   Height:      Weight:  141 lb 1.5 oz (64 kg)    SpO2:  97% 96% 96%    Intake/Output Summary (Last 24 hours) at 01/17/14 0742 Last data filed at 01/17/14 0727  Gross per 24 hour  Intake 445.83 ml  Output    900 ml  Net -454.17 ml   Filed Weights   01/16/14 1216 01/17/14 0014  Weight: 140 lb (63.504 kg) 141 lb 1.5 oz (64 kg)    PHYSICAL EXAM  General: Pleasant, NAD. Neuro: Alert and oriented X 3. Moves all extremities spontaneously. Psych: Normal affect. HEENT:  Normal  Neck: Supple without bruits or JVD. Lungs:  Resp regular and unlabored, CTA. Heart: RRR no s3, s4, or murmurs. Abdomen: Soft, non-tender, non-distended, BS + x 4.  Extremities: No clubbing, cyanosis.  1+ ankle edema on right (pt says this is chronic since CABG). DP/PT/Radials 2+ and equal bilaterally.  R groin site w/o bleeding or hematoma.  Loud bruit noted over right femoral area.  Accessory Clinical Findings  CBC  Recent Labs  01/16/14 1130 01/17/14 0336  WBC  12.2* 9.9  HGB 11.2* 10.0*  HCT 32.3* 29.3*  MCV 96.1 95.8  PLT 133* 125*   Basic Metabolic Panel  Recent Labs  01/16/14 1130 01/17/14 0336  NA 145 141  K 4.5 4.2  CL 109 107  CO2 25 24  GLUCOSE 83 80  BUN 22 21  CREATININE 1.27 1.32  CALCIUM 8.0* 7.7*   TELE  A paced, occas V pacing/AV pacing, pvc's/couplets.  ECG  A paced, inc rbbb, poor r prog, inf twi.  ASSESSMENT AND PLAN  1.  USA/CAD:  S/p recent nstemi and anbl cardiolite.  Cath yesterday revealed severe native CAD with severe dzs within the VG->OM.  This was successfully treated with a 5.19mm BMS.  Pt tolerated procedure well.  No chest pain or dyspnea overnight.  Has ambulated w/o difficulty.  Cont asa, plavix (at least 30 days), statin, nitrate, bb.  He has a loud right groin bruit - not previously documented.  Will check u/s to r/o psa/avf.  2.  HTN:  BP variable.  Cont bb/ccb/nitrate.  3.  HL:  LDL 50 06/2011.  Will need f/u as outpt.  Cont statin.  4.  Tob Abuse:  Cessation advised.  Not interested in  quitting.  5.  Chronic systolic CHF:  EF 35-40% by echo 03/2013.  Euvolemic on exam.  Switch bb to toprol xl.  Should be on ACEI.  Could consider adding as outpt if renal fxn remains stable post PCI.  6. R groin bruit:  As above, not prev documented.  Will obtain r groin u/s to r/o psa/avf.  Signed, Nicolasa Duckinghristopher Berge NP    Patient seen and examined. Agree with assessment and plan. R duplex just completed. No pseudoaneurysm.  Chest pain. For DC   Lennette Biharihomas A. Kelly, MD, [facility] 01/17/2014 4:55 PM

## 2014-01-17 NOTE — Discharge Instructions (Signed)
**  PLEASE REMEMBER TO BRING ALL OF YOUR MEDICATIONS TO EACH OF YOUR FOLLOW-UP OFFICE VISITS. ° °NO HEAVY LIFTING OR SEXUAL ACTIVITY X 7 DAYS. °NO DRIVING X 3-5 DAYS. °NO SOAKING BATHS, HOT TUBS, POOLS, ETC., X 7 DAYS. ° °Groin Site Care °Refer to this sheet in the next few weeks. These instructions provide you with information on caring for yourself after your procedure. Your caregiver may also give you more specific instructions. Your treatment has been planned according to current medical practices, but problems sometimes occur. Call your caregiver if you have any problems or questions after your procedure. °HOME CARE INSTRUCTIONS °· You may shower 24 hours after the procedure. Remove the bandage (dressing) and gently wash the site with plain soap and water. Gently pat the site dry.  °· Do not apply powder or lotion to the site.  °· Do not sit in a bathtub, swimming pool, or whirlpool for 5 to 7 days.  °· No bending, squatting, or lifting anything over 10 pounds (4.5 kg) as directed by your caregiver.  °· Inspect the site at least twice daily.  ° °What to expect: °· Any bruising will usually fade within 1 to 2 weeks.  °· Blood that collects in the tissue (hematoma) may be painful to the touch. It should usually decrease in size and tenderness within 1 to 2 weeks.  °SEEK IMMEDIATE MEDICAL CARE IF: °· You have unusual pain at the groin site or down the affected leg.  °· You have redness, warmth, swelling, or pain at the groin site.  °· You have drainage (other than a small amount of blood on the dressing).  °· You have chills.  °· You have a fever or persistent symptoms for more than 72 hours.  °· You have a fever and your symptoms suddenly get worse.  °· Your leg becomes pale, cool, tingly, or numb.  °You have heavy bleeding from the site. Hold pressure on the site. . ° °

## 2014-01-17 NOTE — Progress Notes (Signed)
CARDIAC REHAB PHASE I   PRE:  Rate/Rhythm: 76 SR  BP:  Supine: 141/65  Sitting:   Standing:    SaO2:   MODE:  Ambulation: 1000 ft   POST:  Rate/Rhythm: 86 SR  BP:  Supine:   Sitting: 110/49  Standing:    SaO2:  0820-0915 Pt tolerated ambulation well without c/o of cp or SOB. He did c/o of his legs getting tired and he took one sitting rest stop. Pt to recliner after walk with call light in reach. Completed MI and stent education with pt. He voices understanding. We discussed smoking cessation. I gave him tips for quitting, quit smart class schedule and coaching contact number. We discussed CHF signs and symptoms. Discussed daily weights, and when to call MD and 911. Pt has very strong opinions and does not seem interested in quitting smoking. I also suggested for him to keep his medications on a regular time schedule, that we gave them in the mornings here. He got very upset and states that he takes all of his medications at night because they cause him to get nauseated and that that way the way he was going to do it. I discussed this with his RN. He declines Outpt. CRP, not interested. Melina CopaLisa Kaniah Rizzolo RN 01/17/2014 10:09 AM

## 2014-01-17 NOTE — Discharge Summary (Signed)
Discharge Summary   Patient ID: [name],  MRN: [id number], DOB/AGE: May 29, 1927 80 y.o.  Admit date: 01/16/2014 Discharge date: 01/17/2014  Primary Care Provider: Marjory LiesBURNETT,BRENT A Primary Cardiologist: Eloy EndK. Nelson, MD   Discharge Diagnoses Principal Problem:   Unstable angina  **Status post successful PCI and bare metal stenting of the VG->OM this admission.  Active Problems:   CAD (coronary artery disease) of artery bypass graft   HTN (hypertension)   Tobacco abuse   Chronic systolic CHF (congestive heart failure)   Hyperlipidemia   Sick sinus syndrome   History of CVA (cerebrovascular accident)   PVD  Allergies Allergies  Allergen Reactions  . Pravachol     myalgias  . Simvastatin     myalgia   Procedures  Cardiac Catheterization and Percutaneous Coronary Intervention 2.11.2015  Hemodynamic Findings: Central aortic pressure: 181/68 Left ventricular pressure: 177/0/20  Angiographic Findings:  Left main: Distal 30% stenosis.   Left Anterior Descending Artery: Large caliber vessel. 70% proximal stenosis followed by 100% mid occlusion. The mid and distal vessel fills from the patent IMA graft. The Diagonal branch fills from the patent vein graft.   Circumflex Artery: Moderate caliber vessel with mid occlusion. The first OM branch is patent. The second OM branch fills from the patent vein graft.   Right Coronary Artery: Large caliber dominant vessel. The proximal vessel has 100% occlusion. The distal vessel fills from left to right collaterals. The vein graft to the distal vessel is known to be occluded.   Graft Anatomy:  SVG to RCA is occluded SVG to OM is patent with 40% ostial stenosis, diffuse aneurysmal enlargement of entire vein graft with 40% mid body stenosis and 99% hazy mid body stenosis.     **The VG->OM was successfully treated using a 5.0 x 12mm Veriflex bare metal stent.**  SVG to Diagonal is patent but the entire graft is aneurysmal with diffuse  disease, TIMI-1 flow. Not a target for PCI LIMA to LAD is patent.   Left Ventricular Angiogram: Deferred.   Impression: 1. Severe triple vessel CAD with 3/4 patent grafts.   2. Recent NSTEMI and lateral wall ischemia on stress test secondary to severe disease in body of SVG to OM 3. Successful PTCA/bare metal stent x 1 mid body of SVG to OM  Complications:  None. The patient tolerated the procedure well.          _____________   History of Present Illness  80 year old male with prior history of coronary artery disease status post coronary artery bypass grafting. He also has a history of hypertension, hyperlipidemia, tobacco abuse, ischemic cardiomyopathy chronic systolic heart failure, and sick sinus syndrome status post pacemaker placement.  He was recently hospitalized in January of this year for acute bronchitis and was found to have troponin elevation. During hospitalization, he underwent stress testing which revealed basal inferolateral scar with peri-infarct ischemia. Initially decision was made to pursue medical therapy with early followup. Upon followup on February 10, patient complained of profound fatigue and dyspnea exertion similar to his prior anginal equivalent. Decision was made at that point to pursue diagnostic catheterization.  Hospital Course  Patient [facility] to [facility] cardiac [facility] laboratory on 01/16/2014, and underwent diagnostic catheterization revealing severe native three-vessel coronary artery disease with a patent vein graft to the diagonal, patent LIMA to the LAD, and an occluded vein graft to the right coronary artery. The vein graft to the obtuse marginal had a severe 99% stenosis in the mid body of the graft  and this was felt to be the culprit for his symptoms. The vein graft to the obtuse marginal was successfully stented using a 5.0 x 12mm Veriflex bare metal stent. Patient tolerated procedure well and post procedure has ambulated without  recurrent symptoms or limitations. He has been noted to have a loud right femoral bruit and a femoral ultrasound was performed and showed no evidence of pseudoaneurysm or av fistula.  As a result, he will be discharged home today in good condition.  Discharge Vitals Blood pressure 141/65, pulse 73, temperature 98.6 F (37 C), temperature source Oral, resp. rate 18, height 5\' 9"  (1.753 m), weight 141 lb 1.5 oz (64 kg), SpO2 96.00%.  Filed Weights   01/16/14 1216 01/17/14 0014  Weight: 140 lb (63.504 kg) 141 lb 1.5 oz (64 kg)    Labs  CBC  Recent Labs  01/16/14 1130 01/17/14 0336  WBC 12.2* 9.9  HGB 11.2* 10.0*  HCT 32.3* 29.3*  MCV 96.1 95.8  PLT 133* 125*   Basic Metabolic Panel  Recent Labs  01/16/14 1130 01/17/14 0336  NA 145 141  K 4.5 4.2  CL 109 107  CO2 25 24  GLUCOSE 83 80  BUN 22 21  CREATININE 1.27 1.32  CALCIUM 8.0* 7.7*   Disposition  Pt is being discharged home today in good condition.  Follow-up Plans & Appointments    Discharge Medications    Medication List    ASK your doctor about these medications       albuterol 108 (90 BASE) MCG/ACT inhaler  Commonly known as:  PROVENTIL HFA;VENTOLIN HFA  Inhale 2 puffs into the lungs every 6 (six) hours as needed for wheezing or shortness of breath.     amLODipine 10 MG tablet  Commonly known as:  NORVASC  Take 10 mg by mouth at bedtime.     aspirin 81 MG tablet  Take 81 mg by mouth at bedtime.     atorvastatin 40 MG tablet  Commonly known as:  LIPITOR  Take 20 mg by mouth at bedtime.     B-6 PO  Take 1 tablet by mouth at bedtime.     feeding supplement (ENSURE COMPLETE) Liqd  Take 237 mLs by mouth 2 (two) times daily between meals.     isosorbide mononitrate 60 MG 24 hr tablet  Commonly known as:  IMDUR  Take 60 mg by mouth at bedtime.     metoprolol 50 MG tablet  Commonly known as:  LOPRESSOR  Take 50 mg by mouth 2 (two) times daily.     nitroGLYCERIN 0.4 MG SL tablet  Commonly  known as:  NITROSTAT  Place 0.4 mg under the tongue every 5 (five) minutes as needed. For chest pain     sertraline 100 MG tablet  Commonly known as:  ZOLOFT  Take 50 mg by mouth at bedtime.     tamsulosin 0.4 MG Caps capsule  Commonly known as:  FLOMAX  Take 0.4 mg by mouth daily after supper.       Outstanding Labs/Studies  None  Duration of Discharge Encounter   Greater than 30 minutes including physician time.  Signed, Nicolasa Ducking NP 01/17/2014, 10:04 AM

## 2014-02-06 ENCOUNTER — Encounter: Payer: Non-veteran care | Admitting: Cardiology

## 2014-02-12 ENCOUNTER — Encounter: Payer: Self-pay | Admitting: Cardiology

## 2014-02-12 ENCOUNTER — Ambulatory Visit (INDEPENDENT_AMBULATORY_CARE_PROVIDER_SITE_OTHER): Payer: Medicare Other | Admitting: Cardiology

## 2014-02-12 ENCOUNTER — Encounter (INDEPENDENT_AMBULATORY_CARE_PROVIDER_SITE_OTHER): Payer: Self-pay

## 2014-02-12 VITALS — BP 104/58 | HR 74 | Ht 68.0 in | Wt 136.0 lb

## 2014-02-12 DIAGNOSIS — I25810 Atherosclerosis of coronary artery bypass graft(s) without angina pectoris: Secondary | ICD-10-CM

## 2014-02-12 LAB — COMPREHENSIVE METABOLIC PANEL
ALT: 13 U/L (ref 0–53)
AST: 18 U/L (ref 0–37)
Albumin: 3 g/dL — ABNORMAL LOW (ref 3.5–5.2)
Alkaline Phosphatase: 79 U/L (ref 39–117)
BUN: 14 mg/dL (ref 6–23)
CO2: 30 mEq/L (ref 19–32)
Calcium: 8.5 mg/dL (ref 8.4–10.5)
Chloride: 107 mEq/L (ref 96–112)
Creatinine, Ser: 1.3 mg/dL (ref 0.4–1.5)
GFR: 68.7 mL/min (ref >60.00)
Glucose, Bld: 85 mg/dL (ref 70–99)
Potassium: 3.7 mEq/L (ref 3.5–5.1)
Sodium: 142 mEq/L (ref 135–145)
Total Bilirubin: 0.6 mg/dL (ref 0.3–1.2)
Total Protein: 5.9 g/dL — ABNORMAL LOW (ref 6.0–8.3)

## 2014-02-12 MED ORDER — AMLODIPINE BESYLATE 5 MG PO TABS: 5.0000 mg | ORAL_TABLET | Freq: Every day | ORAL | Status: DC

## 2014-02-12 NOTE — Progress Notes (Signed)
Patient ID: [name], male   DOB: [birth year], 80 y.o.   MRN: [id number]    Louis Lek, MD: Primary Cardiologist: former [name] - followed at the [facility] [name] is a 80 y.o. male with a h/o symptomatic sinus bradycardia and syncope sp PPM (MDT) by Louis [facility]ne [facility] with generator change 08/2010 by the [facility], seen by Louis [name] on 12/13/2013. His past medical history is significant for CAD, s/p CABG, PVD, CVA, COPD, and hypothyroidism.  He remains very active for his age and has no functional limitations.  Unfortunately, he continues to smoke and has not desire to quit. He is grieving his spouse who died several years ago (after 59 years and 1 day of marriage).  He appears to live alone now.  He was admitted on 12/30/2013 with acute bronchitis and NSTEMI with maximum TnI 5.03 in the settings of an acute infection. In hospital Lexiscan showed mid and basal inferolateral scar with peri-infarct ischemia.  The patient is coming 1 week after discharge, he complains of profound fatigue and DOE. He states that he felt like this before his CABG surgery.   His stress test was positive for inferolateral scar with some reversibility and underwent cardiac cath on 01/17/14 with findings of severe disease in body of SVG to OM  S/p successful PTCA/bare metal stent x 1 mid body of SVG to OM.  Today he is complaining of mild few seconds lasting left sided chest pain on 2 occasions, no associated symptoms. Also postural hypotension in the last week, no syncope.   Past Medical History  Diagnosis Date  . Hypertension   . CAD (coronary artery disease)     a. s/p MI in 1988;  b. s/p CABG in 2000 (Louis. Barry [name]) x 4, LIMA to LAD, SVG to diagonal, SVG to [facility], SVG to PDA;  c. 12/2013 NSTEMI/abnl MV;  d. 01/2014 Cath/PCI: native 3VD, VG->RCA 100, VG->OM aneurysmal 40ost/m, 29m(5.0x12 Veriflex BMS), VG->Diag aneurysmal, LIMA->LAD nl.  . TIA (transient ischemic attack)   . Hyperlipidemia   . COPD (chronic obstructive pulmonary  disease)   . CVA (cerebral infarction) 07/2011    Remote left brain infarct with sensory deficits and had a left internal carotid stent placed by Louis. Corliss [name] at that time, no recurrent CVA or TIAs or CNS events.  Marland Kitchen PAD (peripheral artery disease)     Stable. Had a past RFPBG by Louis. Arbie [name] in 2009. this graft is occluded with reconstitution in the popliteal artery, which was patent. Right tibial was occluded and 2-vessel runoff on angiography done by Louis. Myra [name] in 12/2010.  . Sick sinus syndrome     PPM implanted for this and syncope   . Chronic systolic CHF (congestive heart failure)     Hospitalization for CHF from 05/04/13-05/06/13. Treated medically. Had possible pneumonia with short course of antibiotics.  . Abdominal bruit     Loud  . AAA (abdominal aortic aneurysm)     Has a known AAA of 3.2 x 3.4 cm by abdominal untrasound 03/27/12. Followup abdominal ultrasound on 03/29/13 showed a diameter of 3.6 cm, which is stable  . Peripheral vascular disease   . Hypothyroid     On supplement "don't know if I take RX or not for my thyroid" (01/16/2014)  . Stroke    Past Surgical History  Procedure Laterality Date  . Femoral bypass Right ?2001  . Carotid stent Left 06/23/11    By Louis. Myra [name]  . Coronary artery bypass graft  2000  1st in 2000 (Louis. Barry Dieneswens) x 4, LIMA to LAD, SVG to diagonal, SVG to OM4, SVG to PDA. Re-cath in May 2012 with patent LIMA to LAD, patent SVG to diagonal, patent SVG to OM with left-to-right collaterals to an occluded right.  . Coronary angioplasty with stent placement  2001-01/16/2014    "think today makes #4" (01/16/2014)  . Cardiac catheterization  2000  . Inguinal hernia repair Right 1980's  . Insert / replace / remove pacemaker      implanted 2003 by [name] with gen change (MDT ADDRL1) [facility]  . Cataract extraction w/ intraocular lens  implant, bilateral Bilateral     History   Social History  . Marital Status: Widowed    Spouse Name: N/A    Number of  Children: N/A  . Years of Education: N/A   Occupational History  . Not on file.   Social History Main Topics  . Smoking status: Current Every Day Smoker -- 0.50 packs/day for 68 years    Types: Cigarettes  . Smokeless tobacco: Former NeurosurgeonUser    Quit date: 06/02/2011  . Alcohol Use: No  . Drug Use: No  . Sexual Activity: Yes   Other Topics Concern  . Not on file   Social History Narrative   [place] in [place].  [facility]    Family History  Problem Relation Age of Onset  . Cancer Brother 60    oral cawncer  . Cancer Brother     prostate ca  . Hypertension Brother   . Hyperlipidemia Brother     Allergies  Allergen Reactions  . Pravachol     myalgias  . Simvastatin     myalgia    Current Outpatient Prescriptions  Medication Sig Dispense Refill  . albuterol (PROVENTIL HFA;VENTOLIN HFA) 108 (90 BASE) MCG/ACT inhaler Inhale 2 puffs into the lungs every 6 (six) hours as needed for wheezing or shortness of breath.  1 Inhaler  0  . amLODipine (NORVASC) 10 MG tablet Take 10 mg by mouth at bedtime.       Marland Kitchen. aspirin 81 MG tablet Take 81 mg by mouth at bedtime.      Marland Kitchen. atorvastatin (LIPITOR) 40 MG tablet Take 20 mg by mouth at bedtime.      . clopidogrel (PLAVIX) 75 MG tablet Take 1 tablet (75 mg total) by mouth daily with breakfast.  30 tablet  6  . feeding supplement, ENSURE COMPLETE, (ENSURE COMPLETE) LIQD Take 237 mLs by mouth 2 (two) times daily between meals.  60 Bottle  0  . isosorbide mononitrate (IMDUR) 60 MG 24 hr tablet Take 60 mg by mouth at bedtime.       . metoprolol succinate (TOPROL-XL) 100 MG 24 hr tablet Take 1 tablet (100 mg total) by mouth daily. Take with or immediately following a meal.  30 tablet  6  . nitroGLYCERIN (NITROSTAT) 0.4 MG SL tablet Place 0.4 mg under the tongue every 5 (five) minutes as needed. For chest pain      . sertraline (ZOLOFT) 100 MG tablet Take 50 mg by mouth at bedtime.       . Tamsulosin HCl  (FLOMAX) 0.4 MG CAPS Take 0.4 mg by mouth daily after supper.        No current facility-administered medications for this visit.    ROS- all systems are reviewed and negative except as per HPI  Physical Exam: Filed Vitals:   02/12/14 0925 02/12/14  0930  BP: 118/64 104/58  Pulse: 74   Height: 5\' 8"  (1.727 m)   Weight: 136 lb (61.689 kg)     GEN- The patient is well appearing, alert and oriented x 3 today.   Head- normocephalic, atraumatic Eyes-  Sclera clear, conjunctiva pink Ears- hearing intact Oropharynx- clear Neck- supple  Lungs- Clear to ausculation bilaterally, normal work of breathing Chest- pacemaker pocket is well healed Heart- Regular rate and rhythm  GI- soft, NT, ND, + BS Extremities- no clubbing, cyanosis, LUE - swollen from elbow down, mildly tender and warm, good peripheral pulses MS- no significant deformity or atrophy Skin- no rash or lesion Psych- euthymic mood, full affect Neuro- strength and sensation are intact  Pacemaker interrogation- reviewed in detail today,  See PACEART report Louis Kandis Cocking records are reviewed today  ECHO 03/29/13  Study Conclusions  - Left ventricle: The cavity size was mildly dilated. Systolic function was moderately reduced. The estimated ejection fraction was in the range of 35% to 40%. Moderate hypokinesis of the inferolateral myocardium. Mild hypokinesis of the inferior myocardium. Doppler parameters are consistent with abnormal left ventricular relaxation (grade 1 diastolic dysfunction). - Aortic valve: Mild regurgitation. - Mitral valve: Calcified annulus. Mild regurgitation. - Left atrium: The atrium was mildly dilated. - Right ventricle: Systolic pressure was increased. - Right atrium: The atrium was mildly dilated. - Tricuspid valve: Moderate regurgitation. - Pulmonary arteries: PA peak pressure: 49mm Hg (S). Impressions:  - Atrial paced rhythm with intact A-V conduction. The right ventricular systolic  pressure was increased consistent with moderate pulmonary hypertension.   Lexiscan nuclear stress test: 01/04/2014 FINDINGS: Quality of the study is satisfactory. Patient tolerated lexiscan without symptoms. No chest pain. EKG shows changes of paced rhythm.  Perfusion images show a medium size defect of moderate severity involving the midinferolateral and basal inferolateral segments. There is partial reversibility.  The EDV is 135 ml. The ESV is 80 ml. The EF is 41%. There is mild inferior wall hypokinesis.  IMPRESSION: Abnormal lexiscan myoview consistent with inferolateral scar with partial reversibility. There is LV systolic dysfunction with inferior wall motion abnormality.  Cassell Clement M.D. On: 01/04/2014 15:11  Cath 01/16/2014 Angiographic Findings:  Left main: Distal 30% stenosis.  Left Anterior Descending Artery: Large caliber vessel. 70% proximal stenosis followed by 100% mid occlusion. The mid and distal vessel fills from the patent IMA graft. The Diagonal branch fills from the patent vein graft.  Circumflex Artery: Moderate caliber vessel with mid occlusion. The first OM branch is patent. The second OM branch fills from the patent vein graft.  Right Coronary Artery: Large caliber dominant vessel. The proximal vessel has 100% occlusion. The distal vessel fills from left to right collaterals. The vein graft to the distal vessel is known to be occluded.  Graft Anatomy:  SVG to RCA is occluded  SVG to OM is patent with 40% ostial stenosis, diffuse aneurysmal enlargement of entire vein graft with 40% mid body stenosis and 99% hazy mid body stenosis.  SVG to Diagonal is patent but the entire graft is aneurysmal with diffuse disease, TIMI-1 flow. Not a target for PCI  LIMA to LAD is patent.  Left Ventricular Angiogram: Deferred.  Impression:  1. Severe triple vessel CAD with 3/4 patent grafts.  2. Recent NSTEMI and lateral wall ischemia on stress test secondary to severe  disease in body of SVG to OM  3. Successful PTCA/bare metal stent x 1 mid body of SVG to OM  Recommendations: He will need  ASA and Plavix for at least one month but longer if he tolerates well. Continue other cardiac meds. D/C home tomorrow if stable with f/u with Louis. Delton See in 2-3 weeks.  Complications: None. The patient tolerated the procedure well.     ASSESSMENT AND PLAN   80 year old male   1. NSTEMI on 12/30/2013 - in the settings of acute URI, maximum troponin 5, Positive Lexiscan nuclear stress test with mid and basal inferolateral scar with periinfarct ischemia, moderate LV dysfunction 40%, cardiac cath on 01/17/14 showed severe disease in body of SVG to OM, s/p successful PTCA/bare metal stent x 1 mid body of SVG to OM. ASA and Plavix for at least one month but longer if he tolerates well. Continue metoprolol, atorvastatin.   2. Acute on chronic systolic CHF - LVEF 35%, he is euvolemic now, hold lasix, recheck Crea today.  3.Chronic CKD - baseline Crea 1.5, recheck today.  4. S/P PPM for symptomatic bradycardia - followed by Louis [name]  5. Hypertension - controlled now   6. Orthostatic hypotension - positive orthostatics today, we will decrease amlodipine to 5 mg po daily, if no improvement we will discontinue.  Follow up in 2 months.  Lars Masson 02/12/2014

## 2014-02-12 NOTE — Patient Instructions (Signed)
**Note De-Identified [name] Obfuscation** Your physician has recommended you make the following change in your medication: decrease Amlodipine to 5 mg daily  Your physician recommends that you return for lab work in: today  Your physician recommends that you schedule a follow-up appointment in: 2 months

## 2014-02-26 ENCOUNTER — Other Ambulatory Visit (HOSPITAL_COMMUNITY): Payer: Self-pay | Admitting: Nurse Practitioner

## 2014-04-10 ENCOUNTER — Encounter: Payer: Self-pay | Admitting: *Deleted

## 2014-04-15 ENCOUNTER — Ambulatory Visit (INDEPENDENT_AMBULATORY_CARE_PROVIDER_SITE_OTHER): Payer: Medicare Other | Admitting: Cardiology

## 2014-04-15 ENCOUNTER — Encounter: Payer: Self-pay | Admitting: Cardiology

## 2014-04-15 VITALS — BP 128/68 | HR 76 | Ht 68.0 in | Wt 146.0 lb

## 2014-04-15 DIAGNOSIS — I25810 Atherosclerosis of coronary artery bypass graft(s) without angina pectoris: Secondary | ICD-10-CM

## 2014-04-15 DIAGNOSIS — I509 Heart failure, unspecified: Secondary | ICD-10-CM

## 2014-04-15 DIAGNOSIS — I200 Unstable angina: Secondary | ICD-10-CM

## 2014-04-15 DIAGNOSIS — I5043 Acute on chronic combined systolic (congestive) and diastolic (congestive) heart failure: Secondary | ICD-10-CM

## 2014-04-15 DIAGNOSIS — I10 Essential (primary) hypertension: Secondary | ICD-10-CM

## 2014-04-15 DIAGNOSIS — I495 Sick sinus syndrome: Secondary | ICD-10-CM

## 2014-04-15 DIAGNOSIS — I214 Non-ST elevation (NSTEMI) myocardial infarction: Secondary | ICD-10-CM

## 2014-04-15 DIAGNOSIS — I6529 Occlusion and stenosis of unspecified carotid artery: Secondary | ICD-10-CM

## 2014-04-15 DIAGNOSIS — I5022 Chronic systolic (congestive) heart failure: Secondary | ICD-10-CM

## 2014-04-15 DIAGNOSIS — I5023 Acute on chronic systolic (congestive) heart failure: Secondary | ICD-10-CM

## 2014-04-15 MED ORDER — AMLODIPINE BESYLATE 2.5 MG PO TABS: 2.5000 mg | ORAL_TABLET | Freq: Every day | ORAL | Status: DC

## 2014-04-15 MED ORDER — FUROSEMIDE 20 MG PO TABS: 20.0000 mg | ORAL_TABLET | Freq: Every day | ORAL | Status: DC

## 2014-04-15 NOTE — Progress Notes (Signed)
Patient ID: [name], male   DOB: November 21, 1927, 80 y.o.   MRN: [id number]    Louis Lek, MD: Primary Cardiologist: former [name] - followed at the [facility] Dobler is a 80 y.o. male with a h/o symptomatic sinus bradycardia and syncope sp PPM (MDT) by Dr Jenne Campus with generator change 08/2010 by the [facility], seen by Dr [name] on 12/13/2013. His past medical history is significant for CAD, s/p CABG, PVD, CVA, COPD, and hypothyroidism.  He remains very active for his age and has no functional limitations.  Unfortunately, he continues to smoke and has not desire to quit. He is grieving his spouse who died several years ago (after 59 years and 1 day of marriage).  He appears to live alone now.  He was admitted on 12/30/2013 with acute bronchitis and NSTEMI with maximum TnI 5.03 in the settings of an acute infection. In hospital Lexiscan showed mid and basal inferolateral scar with peri-infarct ischemia.  The patient is coming 1 week after discharge, he complains of profound fatigue and DOE. He states that he felt like this before his CABG surgery.   His stress test was positive for inferolateral scar with some reversibility and underwent cardiac cath on 01/17/14 with findings of severe disease in body of SVG to OM  S/p successful PTCA/bare metal stent x 1 mid body of SVG to OM.  Today the patient states that he feels great, he is back to repairing cars and denies any chest pain, shortness of breath or fatigue that was his main presentation before. He has orthostatic hypotension has also resolved. He denies any palpitations or syncope. His only complaint today is mild lower extremity edema that is up to his knees.   Past Medical History  Diagnosis Date  . Hypertension   . CAD (coronary artery disease)     a. s/p MI in 1988;  b. s/p CABG in 2000 (Dr. Barry Dienes) x 4, LIMA to LAD, SVG to diagonal, SVG to [facility], SVG to PDA;  c. 12/2013 NSTEMI/abnl MV;  d. 01/2014 Cath/PCI: native 3VD, VG->RCA 100, VG->OM  aneurysmal 40ost/m, 59m(5.0x12 Veriflex BMS), VG->Diag aneurysmal, LIMA->LAD nl.  . TIA (transient ischemic attack)   . Hyperlipidemia   . COPD (chronic obstructive pulmonary disease)   . CVA (cerebral infarction) 07/2011    Remote left brain infarct with sensory deficits and had a left internal carotid stent placed by Dr. [name] at that time, no recurrent CVA or TIAs or CNS events.  Marland Kitchen PAD (peripheral artery disease)     Stable. Had a past RFPBG by Dr. [name] in 2009. this graft is occluded with reconstitution in the popliteal artery, which was patent. Right tibial was occluded and 2-vessel runoff on angiography done by [name] in 12/2010.  . Sick sinus syndrome     PPM implanted for this and syncope   . Chronic systolic CHF (congestive heart failure)     Hospitalization for CHF from 05/04/13-05/06/13. Treated medically. Had possible pneumonia with short course of antibiotics.  . Abdominal bruit     Loud  . AAA (abdominal aortic aneurysm)     Has a known AAA of 3.2 x 3.4 cm by abdominal untrasound 03/27/12. Followup abdominal ultrasound on 03/29/13 showed a diameter of 3.6 cm, which is stable  . Peripheral vascular disease   . Hypothyroid     On supplement "don't know if I take RX or not for my thyroid" (01/16/2014)  . Stroke    Past Surgical History  Procedure Laterality  Date  . Femoral bypass Right ?2001  . Carotid stent Left 06/23/11    By Dr. Myra GianottiBrabham  . Coronary artery bypass graft  2000    1st in 2000 ([name]) x 4, LIMA to LAD, SVG to diagonal, SVG to OM4, SVG to PDA. Re-cath in May 2012 with patent LIMA to LAD, patent SVG to diagonal, patent SVG to OM with left-to-right collaterals to an occluded right.  . Coronary angioplasty with stent placement  2001-01/16/2014    "think today makes #4" (01/16/2014)  . Cardiac catheterization  2000  . Inguinal hernia repair Right 1980's  . Insert / replace / remove pacemaker      implanted 2003 by Dr [name] with gen change (MDT ADDRL1)  [facility]  . Cataract extraction w/ intraocular lens  implant, bilateral Bilateral     History   Social History  . Marital Status: Widowed    Spouse Name: N/A    Number of Children: N/A  . Years of Education: N/A   Occupational History  . Not on file.   Social History Main Topics  . Smoking status: Current Every Day Smoker -- 0.50 packs/day for 68 years    Types: Cigarettes  . Smokeless tobacco: Former NeurosurgeonUser    Quit date: 06/02/2011  . Alcohol Use: No  . Drug Use: No  . Sexual Activity: Yes   Other Topics Concern  . Not on file   Social History Narrative   [place], spouse is now diseased.  [facility]    Family History  Problem Relation Age of Onset  . Cancer Brother 60    oral   . Prostate cancer Brother   . Hypertension Brother   . Hyperlipidemia Brother   . Lung disease Father     black lung disease    Allergies  Allergen Reactions  . Pravachol     myalgias  . Simvastatin     myalgia    Current Outpatient Prescriptions  Medication Sig Dispense Refill  . albuterol (PROVENTIL HFA;VENTOLIN HFA) 108 (90 BASE) MCG/ACT inhaler Inhale 2 puffs into the lungs every 6 (six) hours as needed for wheezing or shortness of breath.  1 Inhaler  0  . amLODipine (NORVASC) 5 MG tablet Take 1 tablet (5 mg total) by mouth at bedtime.  30 tablet  3  . aspirin 81 MG EC tablet TAKE 1 TABLET (81 MG TOTAL) BY MOUTH DAILY.  90 tablet  0  . aspirin 81 MG tablet Take 81 mg by mouth at bedtime.      Marland Kitchen. atorvastatin (LIPITOR) 40 MG tablet Take 20 mg by mouth at bedtime.      . clopidogrel (PLAVIX) 75 MG tablet Take 1 tablet (75 mg total) by mouth daily with breakfast.  30 tablet  6  . feeding supplement, ENSURE COMPLETE, (ENSURE COMPLETE) LIQD Take 237 mLs by mouth 2 (two) times daily between meals.  60 Bottle  0  . isosorbide mononitrate (IMDUR) 60 MG 24 hr tablet Take 60 mg by mouth at bedtime.       . metoprolol succinate (TOPROL-XL) 100 MG 24  hr tablet Take 1 tablet (100 mg total) by mouth daily. Take with or immediately following a meal.  30 tablet  6  . nitroGLYCERIN (NITROSTAT) 0.4 MG SL tablet Place 0.4 mg under the tongue every 5 (five) minutes as needed. For chest pain      . sertraline (ZOLOFT) 100 MG tablet Take 50  mg by mouth at bedtime.       . Tamsulosin HCl (FLOMAX) 0.4 MG CAPS Take 0.4 mg by mouth daily after supper.        No current facility-administered medications for this visit.    ROS- all systems are reviewed and negative except as per HPI  Physical Exam: Blood pressure 128/68, pulse 76.  GEN- The patient is well appearing, alert and oriented x 3 today.   Head- normocephalic, atraumatic Eyes-  Sclera clear, conjunctiva pink Ears- hearing intact Oropharynx- clear Neck- supple  Lungs- Clear to ausculation bilaterally, normal work of breathing Chest- pacemaker pocket is well healed Heart- Regular rate and rhythm  GI- soft, NT, ND, + BS Extremities- no clubbing, cyanosis, lower extremities - 1+ edema up to the knees. MS- no significant deformity or atrophy Skin- no rash or lesion Psych- euthymic mood, full affect Neuro- strength and sensation are intact  Pacemaker interrogation- reviewed in detail today,  See PACE[facility] report Dr Kandis CockingWeintraub's records are reviewed today  ECHO 03/29/13  Study Conclusions  - Left ventricle: The cavity size was mildly dilated. Systolic function was moderately reduced. The estimated ejection fraction was in the range of 35% to 40%. Moderate hypokinesis of the inferolateral myocardium. Mild hypokinesis of the inferior myocardium. Doppler parameters are consistent with abnormal left ventricular relaxation (grade 1 diastolic dysfunction). - Aortic valve: Mild regurgitation. - Mitral valve: Calcified annulus. Mild regurgitation. - Left atrium: The atrium was mildly dilated. - Right ventricle: Systolic pressure was increased. - Right atrium: The atrium was mildly dilated. -  Tricuspid valve: Moderate regurgitation. - Pulmonary arteries: PA peak pressure: 49mm Hg (S). Impressions:  - Atrial paced rhythm with intact A-V conduction. The right ventricular systolic pressure was increased consistent with moderate pulmonary hypertension.   Lexiscan nuclear stress test: 01/04/2014 FINDINGS: Quality of the study is satisfactory. Patient tolerated lexiscan without symptoms. No chest pain. EKG shows changes of paced rhythm.  Perfusion images show a medium size defect of moderate severity involving the midinferolateral and basal inferolateral segments. There is partial reversibility.  The EDV is 135 ml. The ESV is 80 ml. The EF is 41%. There is mild inferior wall hypokinesis.  IMPRESSION: Abnormal lexiscan myoview consistent with inferolateral scar with partial reversibility. There is LV systolic dysfunction with inferior wall motion abnormality.  Cassell Clementhomas Brackbill M.D. On: 01/04/2014 15:11  Cath 01/16/2014 Angiographic Findings:  Left main: Distal 30% stenosis.  Left Anterior Descending Artery: Large caliber vessel. 70% proximal stenosis followed by 100% mid occlusion. The mid and distal vessel fills from the patent IMA graft. The Diagonal branch fills from the patent vein graft.  Circumflex Artery: Moderate caliber vessel with mid occlusion. The first OM branch is patent. The second OM branch fills from the patent vein graft.  Right Coronary Artery: Large caliber dominant vessel. The proximal vessel has 100% occlusion. The distal vessel fills from left to right collaterals. The vein graft to the distal vessel is known to be occluded.  Graft Anatomy:  SVG to RCA is occluded  SVG to OM is patent with 40% ostial stenosis, diffuse aneurysmal enlargement of entire vein graft with 40% mid body stenosis and 99% hazy mid body stenosis.  SVG to Diagonal is patent but the entire graft is aneurysmal with diffuse disease, TIMI-1 flow. Not a target for PCI  LIMA to LAD is  patent.  Left Ventricular Angiogram: Deferred.  Impression:  1. Severe triple vessel CAD with 3/4 patent grafts.  2. Recent NSTEMI and lateral wall  ischemia on stress test secondary to severe disease in body of SVG to OM  3. Successful PTCA/bare metal stent x 1 mid body of SVG to OM  Recommendations: He will need ASA and Plavix for at least one month but longer if he tolerates well. Continue other cardiac meds. D/C home tomorrow if stable with f/u with Dr. Delton See in 2-3 weeks.  Complications: None. The patient tolerated the procedure well.     ASSESSMENT AND PLAN   80 year old male   1. NSTEMI on 12/30/2013 - in the settings of acute URI, maximum troponin 5, Positive Lexiscan nuclear stress test with mid and basal inferolateral scar with periinfarct ischemia, moderate LV dysfunction 40%, cardiac cath on 01/17/14 showed severe disease in body of SVG to OM, s/p successful PTCA/bare metal stent x 1 mid body of SVG to OM. ASA and Plavix for at least one month but longer if he tolerates well. Continue metoprolol, atorvastatin.   2. Acute on chronic systolic CHF - LVEF 35%, lower extremity edema after stopping the Lasix. We will restart 20 mg daily. We will decrease amlodipine to 2.5 mg daily as we are adding Lasix.  3.Chronic CKD - baseline Crea 1.5, 1.3 in February. We'll check again in August since he is starting Lasix.  4. S/P PPM for symptomatic bradycardia - followed by Dr [name]  5. Hypertension - controlled now   6. Orthostatic hypotension - resolved after decreasing the dose of amlodipine. Further decreased to 2.5 mg daily as we are adding Lasix.   Follow up in 3 months with BMP.  Lars Masson 04/15/2014

## 2014-04-15 NOTE — Patient Instructions (Signed)
START TAKING LASIX (FUROSEMIDE) 20 MG DAILY  DECREASE YOUR AMLODIPINE TO 2.5 MG DAILY  Your physician recommends that you schedule a follow-up appointment in: 3 MONTHS WITH DR Delton SeeNELSON  Your physician recommends that you return for lab work in: AT YOUR 3 MONTH FOLLOW UP APPOINTMENT (BMET)

## 2014-06-09 ENCOUNTER — Encounter (HOSPITAL_COMMUNITY): Payer: Self-pay | Admitting: Emergency Medicine

## 2014-06-09 ENCOUNTER — Inpatient Hospital Stay (HOSPITAL_COMMUNITY)
Admission: EM | Admit: 2014-06-09 | Discharge: 2014-06-11 | DRG: 291 | Disposition: A | Discharge status: Home / Self Care | Payer: Medicare Other | Attending: Internal Medicine | Admitting: Internal Medicine

## 2014-06-09 ENCOUNTER — Emergency Department (HOSPITAL_COMMUNITY): Payer: Medicare Other

## 2014-06-09 DIAGNOSIS — J449 Chronic obstructive pulmonary disease, unspecified: Secondary | ICD-10-CM | POA: Diagnosis present

## 2014-06-09 DIAGNOSIS — I714 Abdominal aortic aneurysm, without rupture, unspecified: Secondary | ICD-10-CM | POA: Diagnosis present

## 2014-06-09 DIAGNOSIS — R0989 Other specified symptoms and signs involving the circulatory and respiratory systems: Secondary | ICD-10-CM

## 2014-06-09 DIAGNOSIS — Z950 Presence of cardiac pacemaker: Secondary | ICD-10-CM

## 2014-06-09 DIAGNOSIS — Z8249 Family history of ischemic heart disease and other diseases of the circulatory system: Secondary | ICD-10-CM

## 2014-06-09 DIAGNOSIS — I10 Essential (primary) hypertension: Secondary | ICD-10-CM | POA: Diagnosis present

## 2014-06-09 DIAGNOSIS — Z8042 Family history of malignant neoplasm of prostate: Secondary | ICD-10-CM

## 2014-06-09 DIAGNOSIS — E440 Moderate protein-calorie malnutrition: Secondary | ICD-10-CM | POA: Diagnosis present

## 2014-06-09 DIAGNOSIS — Z9861 Coronary angioplasty status: Secondary | ICD-10-CM

## 2014-06-09 DIAGNOSIS — J4489 Other specified chronic obstructive pulmonary disease: Secondary | ICD-10-CM | POA: Diagnosis present

## 2014-06-09 DIAGNOSIS — I252 Old myocardial infarction: Secondary | ICD-10-CM

## 2014-06-09 DIAGNOSIS — N179 Acute kidney failure, unspecified: Secondary | ICD-10-CM | POA: Insufficient documentation

## 2014-06-09 DIAGNOSIS — N183 Chronic kidney disease, stage 3 unspecified: Secondary | ICD-10-CM | POA: Diagnosis present

## 2014-06-09 DIAGNOSIS — Z9849 Cataract extraction status, unspecified eye: Secondary | ICD-10-CM

## 2014-06-09 DIAGNOSIS — I25810 Atherosclerosis of coronary artery bypass graft(s) without angina pectoris: Secondary | ICD-10-CM | POA: Diagnosis present

## 2014-06-09 DIAGNOSIS — F17200 Nicotine dependence, unspecified, uncomplicated: Secondary | ICD-10-CM

## 2014-06-09 DIAGNOSIS — I5042 Chronic combined systolic (congestive) and diastolic (congestive) heart failure: Secondary | ICD-10-CM

## 2014-06-09 DIAGNOSIS — I739 Peripheral vascular disease, unspecified: Secondary | ICD-10-CM | POA: Diagnosis present

## 2014-06-09 DIAGNOSIS — R079 Chest pain, unspecified: Secondary | ICD-10-CM

## 2014-06-09 DIAGNOSIS — Z720 Tobacco use: Secondary | ICD-10-CM

## 2014-06-09 DIAGNOSIS — Z836 Family history of other diseases of the respiratory system: Secondary | ICD-10-CM

## 2014-06-09 DIAGNOSIS — R0600 Dyspnea, unspecified: Secondary | ICD-10-CM

## 2014-06-09 DIAGNOSIS — Z888 Allergy status to other drugs, medicaments and biological substances status: Secondary | ICD-10-CM

## 2014-06-09 DIAGNOSIS — E039 Hypothyroidism, unspecified: Secondary | ICD-10-CM | POA: Diagnosis present

## 2014-06-09 DIAGNOSIS — I509 Heart failure, unspecified: Secondary | ICD-10-CM | POA: Diagnosis present

## 2014-06-09 DIAGNOSIS — Z8673 Personal history of transient ischemic attack (TIA), and cerebral infarction without residual deficits: Secondary | ICD-10-CM

## 2014-06-09 DIAGNOSIS — E785 Hyperlipidemia, unspecified: Secondary | ICD-10-CM | POA: Diagnosis present

## 2014-06-09 DIAGNOSIS — R0609 Other forms of dyspnea: Secondary | ICD-10-CM

## 2014-06-09 DIAGNOSIS — Z66 Do not resuscitate: Secondary | ICD-10-CM | POA: Diagnosis present

## 2014-06-09 DIAGNOSIS — I5043 Acute on chronic combined systolic (congestive) and diastolic (congestive) heart failure: Principal | ICD-10-CM | POA: Diagnosis present

## 2014-06-09 DIAGNOSIS — N189 Chronic kidney disease, unspecified: Secondary | ICD-10-CM

## 2014-06-09 DIAGNOSIS — Z961 Presence of intraocular lens: Secondary | ICD-10-CM

## 2014-06-09 DIAGNOSIS — J189 Pneumonia, unspecified organism: Secondary | ICD-10-CM | POA: Diagnosis present

## 2014-06-09 DIAGNOSIS — I158 Other secondary hypertension: Secondary | ICD-10-CM

## 2014-06-09 DIAGNOSIS — I129 Hypertensive chronic kidney disease with stage 1 through stage 4 chronic kidney disease, or unspecified chronic kidney disease: Secondary | ICD-10-CM | POA: Diagnosis present

## 2014-06-09 DIAGNOSIS — IMO0002 Reserved for concepts with insufficient information to code with codable children: Secondary | ICD-10-CM

## 2014-06-09 LAB — CBC WITH DIFFERENTIAL/PLATELET
BASOS ABS: 0.1 10*3/uL (ref 0.0–0.1)
BASOS PCT: 1 % (ref 0–1)
EOS ABS: 0.2 10*3/uL (ref 0.0–0.7)
EOS PCT: 2 % (ref 0–5)
HCT: 42.4 % (ref 39.0–52.0)
Hemoglobin: 14.5 g/dL (ref 13.0–17.0)
Lymphocytes Relative: 39 % (ref 12–46)
Lymphs Abs: 3.2 10*3/uL (ref 0.7–4.0)
MCH: 32.7 pg (ref 26.0–34.0)
MCHC: 34.2 g/dL (ref 30.0–36.0)
MCV: 95.5 fL (ref 78.0–100.0)
Monocytes Absolute: 0.6 10*3/uL (ref 0.1–1.0)
Monocytes Relative: 7 % (ref 3–12)
Neutro Abs: 4.2 10*3/uL (ref 1.7–7.7)
Neutrophils Relative %: 51 % (ref 43–77)
Platelets: 131 10*3/uL — ABNORMAL LOW (ref 150–400)
RBC: 4.44 MIL/uL (ref 4.22–5.81)
RDW: 15.4 % (ref 11.5–15.5)
WBC: 8.2 10*3/uL (ref 4.0–10.5)

## 2014-06-09 LAB — BASIC METABOLIC PANEL
ANION GAP: 10 (ref 5–15)
BUN: 15 mg/dL (ref 6–23)
CALCIUM: 8.7 mg/dL (ref 8.4–10.5)
CO2: 28 meq/L (ref 19–32)
CREATININE: 1.36 mg/dL — AB (ref 0.50–1.35)
Chloride: 105 mEq/L (ref 96–112)
GFR calc Af Amer: 53 mL/min — ABNORMAL LOW (ref >90)
GFR calc non Af Amer: 46 mL/min — ABNORMAL LOW (ref >90)
GLUCOSE: 92 mg/dL (ref 70–99)
Potassium: 3.8 mEq/L (ref 3.7–5.3)
Sodium: 143 mEq/L (ref 137–147)

## 2014-06-09 LAB — TROPONIN I
Troponin I: <0.3 ng/mL (ref <0.30)
Troponin I: <0.3 ng/mL (ref <0.30)

## 2014-06-09 LAB — PRO B NATRIURETIC PEPTIDE: Pro B Natriuretic peptide (BNP): 7675 pg/mL — ABNORMAL HIGH (ref 0–450)

## 2014-06-09 MED ORDER — TAMSULOSIN HCL 0.4 MG PO CAPS: 0.4000 mg | ORAL_CAPSULE | ORAL | Status: DC

## 2014-06-09 MED ORDER — SERTRALINE HCL 50 MG PO TABS
50.0000 mg | ORAL_TABLET | Freq: Every day | ORAL | Status: DC
  Administered 2014-06-09 – 2014-06-10 (×2): 50 mg via ORAL
  Filled 2014-06-09 (×4): qty 1

## 2014-06-09 MED ORDER — AZITHROMYCIN 500 MG PO TABS
500.0000 mg | ORAL_TABLET | ORAL | Status: DC
  Administered 2014-06-09 – 2014-06-10 (×2): 500 mg via ORAL
  Filled 2014-06-09 (×3): qty 1

## 2014-06-09 MED ORDER — ENSURE COMPLETE PO LIQD
237.0000 mL | Freq: Two times a day (BID) | ORAL | Status: DC
  Administered 2014-06-10: 237 mL via ORAL

## 2014-06-09 MED ORDER — LEVOFLOXACIN IN D5W 750 MG/150ML IV SOLN
750.0000 mg | Freq: Once | INTRAVENOUS | Status: AC
  Administered 2014-06-09: 750 mg via INTRAVENOUS
  Filled 2014-06-09: qty 150

## 2014-06-09 MED ORDER — ONDANSETRON HCL 4 MG/2ML IJ SOLN: 4.0000 mg | Freq: Four times a day (QID) | INTRAMUSCULAR | Status: DC | PRN

## 2014-06-09 MED ORDER — ATORVASTATIN CALCIUM 20 MG PO TABS
20.0000 mg | ORAL_TABLET | Freq: Every day | ORAL | Status: DC
  Administered 2014-06-09 – 2014-06-10 (×2): 20 mg via ORAL
  Filled 2014-06-09 (×3): qty 1

## 2014-06-09 MED ORDER — SODIUM CHLORIDE 0.9 % IJ SOLN: 3.0000 mL | INTRAMUSCULAR | Status: DC | PRN

## 2014-06-09 MED ORDER — METOPROLOL SUCCINATE ER 100 MG PO TB24
100.0000 mg | ORAL_TABLET | Freq: Every day | ORAL | Status: DC
  Administered 2014-06-09 – 2014-06-11 (×3): 100 mg via ORAL
  Filled 2014-06-09 (×3): qty 1

## 2014-06-09 MED ORDER — TAMSULOSIN HCL 0.4 MG PO CAPS
0.4000 mg | ORAL_CAPSULE | Freq: Every day | ORAL | Status: DC
  Administered 2014-06-09 – 2014-06-10 (×2): 0.4 mg via ORAL
  Filled 2014-06-09 (×3): qty 1

## 2014-06-09 MED ORDER — ACETAMINOPHEN 325 MG PO TABS: 650.0000 mg | ORAL_TABLET | ORAL | Status: DC | PRN

## 2014-06-09 MED ORDER — SODIUM CHLORIDE 0.9 % IJ SOLN
3.0000 mL | Freq: Two times a day (BID) | INTRAMUSCULAR | Status: DC
  Administered 2014-06-09 – 2014-06-11 (×4): 3 mL via INTRAVENOUS

## 2014-06-09 MED ORDER — ISOSORBIDE MONONITRATE ER 60 MG PO TB24
60.0000 mg | ORAL_TABLET | Freq: Every day | ORAL | Status: DC
  Administered 2014-06-09 – 2014-06-10 (×2): 60 mg via ORAL
  Filled 2014-06-09 (×3): qty 1

## 2014-06-09 MED ORDER — SODIUM CHLORIDE 0.9 % IV SOLN: 250.0000 mL | INTRAVENOUS | Status: DC | PRN

## 2014-06-09 MED ORDER — ASPIRIN 81 MG PO CHEW
324.0000 mg | CHEWABLE_TABLET | Freq: Once | ORAL | Status: AC
  Administered 2014-06-09: 324 mg via ORAL
  Filled 2014-06-09: qty 4

## 2014-06-09 MED ORDER — ASPIRIN 81 MG PO TBEC
81.0000 mg | DELAYED_RELEASE_TABLET | Freq: Every day | ORAL | Status: DC
  Administered 2014-06-09 – 2014-06-11 (×3): 81 mg via ORAL
  Filled 2014-06-09 (×3): qty 1

## 2014-06-09 MED ORDER — AMLODIPINE BESYLATE 5 MG PO TABS
5.0000 mg | ORAL_TABLET | Freq: Every day | ORAL | Status: DC
  Administered 2014-06-10 – 2014-06-11 (×2): 5 mg via ORAL
  Filled 2014-06-09 (×2): qty 1

## 2014-06-09 MED ORDER — FUROSEMIDE 10 MG/ML IJ SOLN
20.0000 mg | Freq: Once | INTRAMUSCULAR | Status: AC
  Administered 2014-06-09: 20 mg via INTRAVENOUS

## 2014-06-09 MED ORDER — GABAPENTIN 300 MG PO CAPS
300.0000 mg | ORAL_CAPSULE | Freq: Two times a day (BID) | ORAL | Status: DC
  Administered 2014-06-09 – 2014-06-11 (×4): 300 mg via ORAL
  Filled 2014-06-09 (×5): qty 1

## 2014-06-09 MED ORDER — ENOXAPARIN SODIUM 40 MG/0.4ML ~~LOC~~ SOLN
40.0000 mg | SUBCUTANEOUS | Status: DC
  Administered 2014-06-09 – 2014-06-10 (×2): 40 mg via SUBCUTANEOUS
  Filled 2014-06-09 (×3): qty 0.4

## 2014-06-09 MED ORDER — CLOPIDOGREL BISULFATE 75 MG PO TABS
75.0000 mg | ORAL_TABLET | Freq: Every day | ORAL | Status: DC
  Administered 2014-06-10 – 2014-06-11 (×2): 75 mg via ORAL
  Filled 2014-06-09 (×3): qty 1

## 2014-06-09 MED ORDER — FUROSEMIDE 10 MG/ML IJ SOLN
20.0000 mg | Freq: Two times a day (BID) | INTRAMUSCULAR | Status: DC
  Administered 2014-06-09 – 2014-06-11 (×4): 20 mg via INTRAVENOUS
  Filled 2014-06-09 (×5): qty 2

## 2014-06-09 MED ORDER — DEXTROSE 5 % IV SOLN
1.0000 g | INTRAVENOUS | Status: DC
  Administered 2014-06-09: 1 g via INTRAVENOUS
  Filled 2014-06-09 (×2): qty 10

## 2014-06-09 NOTE — ED Notes (Signed)
Lab at bedside

## 2014-06-09 NOTE — H&P (Signed)
[facility] History and Physical  Louis HimRaymond Friel ZOX:[id number] DOB: [birth year] DOA: 06/09/2014  Referring physician: Blane OharaJoshua Zavitz, ER physician PCP: Delorse LekBURNETT,BRENT A, MD   Chief Complaint: Cough, shortness of breath and chest pressure  HPI: [name] is a 80 y.o. male  Past medical history of systolic and diastolic dysfunction plus ongoing tobacco abuse presents the emergency room after a week of increased productive cough and she progressively worsening shortness of breath. Patient states that things got really bad in the last 24 hours we started noticing some chest pressure across his entire chest and felt as if a "big man was sitting on him".  He also complained of a worsening cough with whitish sputum. He came into the emergency room for further evaluation today, 7/5. Chronic S. emergency room, chest pressure was gone.  In the emergency room, labs were done which for the most part were unremarkable with normal white count and no shift in normal electrolytes. Normal troponin. BNP was elevated at 7675. In addition, chest x-ray noted small amount of pleural effusions but bibasilar infiltrate for pneumonia. Hospitalists were called for admission. Patient was given oxygen, Lasix and IV antibiotics.  Review of Systems:  Seen in emergency room. Doing okay. Was hungry. Denies any headaches or vision changes or dysphagia. Denies any current chest pain or palpitations. States her breathing is easier with oxygen. Complains of a mild nonproductive cough. Denies any wheezing. Denies any abdominal pain, hematuria, dysuria, constipation, diarrhea, focal extremity numbness or weakness or pain. His review systems otherwise negative.  Past Medical History  Diagnosis Date  . Hypertension   . CAD (coronary artery disease)     a. s/p MI in 1988;  b. s/p CABG in 2000 ([name]) x 4, LIMA to LAD, SVG to diagonal, [facility], SVG to PDA;  c. 12/2013 NSTEMI/abnl MV;  d. 01/2014 Cath/PCI: native 3VD, VG->RCA  100, VG->OM aneurysmal 40ost/m, 7153m(5.0x12 Veriflex BMS), VG->Diag aneurysmal, LIMA->LAD nl.  . TIA (transient ischemic attack)   . Hyperlipidemia   . COPD (chronic obstructive pulmonary disease)   . CVA (cerebral infarction) 07/2011    Remote left brain infarct with sensory deficits and had a left internal carotid stent placed by Dr. Corliss Skainseveshwar at that time, no recurrent CVA or TIAs or CNS events.  Marland Kitchen. PAD (peripheral artery disease)     Stable. Had a past RFPBG by Dr. Arbie CookeyEarly in 2009. this graft is occluded with reconstitution in the popliteal artery, which was patent. Right tibial was occluded and 2-vessel runoff on angiography done by Dr. Myra GianottiBrabham in 12/2010.  . Sick sinus syndrome     PPM implanted for this and syncope   . Chronic systolic CHF (congestive heart failure)     Hospitalization for CHF from 05/04/13-05/06/13. Treated medically. Had possible pneumonia with short course of antibiotics.  . Abdominal bruit     Loud  . AAA (abdominal aortic aneurysm)     Has a known AAA of 3.2 x 3.4 cm by abdominal untrasound 03/27/12. Followup abdominal ultrasound on 03/29/13 showed a diameter of 3.6 cm, which is stable  . Peripheral vascular disease   . Hypothyroid     On supplement "don't know if I take RX or not for my thyroid" (01/16/2014)  . Stroke    Past Surgical History  Procedure Laterality Date  . Femoral bypass Right ?2001  . Carotid stent Left 06/23/11    By Dr. Myra GianottiBrabham  . Coronary artery bypass graft  2000    1st in 2000 (  Dr. Barry Dienes) x 4, LIMA to LAD, SVG to diagonal, SVG to OM4, SVG to PDA. Re-cath in May 2012 with patent LIMA to LAD, patent SVG to diagonal, patent SVG to OM with left-to-right collaterals to an occluded right.  . Coronary angioplasty with stent placement  2001-01/16/2014    "think today makes #4" (01/16/2014)  . Cardiac catheterization  2000  . Inguinal hernia repair Right 1980's  . Insert / replace / remove pacemaker      implanted 2003 by Dr Jenne Campus with gen change (MDT  ADDRL1) [facility]  . Cataract extraction w/ intraocular lens  implant, bilateral Bilateral    Social History:  reports that he has been smoking Cigarettes.  He has a 34 pack-year smoking history. He quit smokeless tobacco use about 3 years ago. He reports that he does not drink alcohol or use illicit drugs. patient was at home by himself. Family is nearby. He is normally able to ambulate without use of a walker or pacemaker  Allergies  Allergen Reactions  . Pravachol     myalgias  . Simvastatin     myalgia  . Terazosin     Family History  Problem Relation Age of Onset  . Cancer Brother 60    oral   . Prostate cancer Brother   . Hypertension Brother   . Hyperlipidemia Brother   . Lung disease Father     black lung disease   confirmed by patient, also mother with hypertension  Prior to Admission medications   Medication Sig Start Date End Date Taking? Authorizing Provider  amLODipine (NORVASC) 10 MG tablet Take 5 mg by mouth daily.   Yes Historical Provider, MD  aspirin 81 MG EC tablet TAKE 1 TABLET (81 MG TOTAL) BY MOUTH DAILY. 02/26/14  Yes Lars Masson, MD  atorvastatin (LIPITOR) 40 MG tablet Take 20 mg by mouth at bedtime.   Yes Historical Provider, MD  clopidogrel (PLAVIX) 75 MG tablet Take 1 tablet (75 mg total) by mouth daily with breakfast. 01/17/14  Yes Ok Anis, NP  feeding supplement, ENSURE COMPLETE, (ENSURE COMPLETE) LIQD Take 237 mLs by mouth 2 (two) times daily between meals. 01/05/14  Yes Renae Fickle, MD  furosemide (LASIX) 20 MG tablet Take 1 tablet (20 mg total) by mouth daily. 04/15/14  Yes Lars Masson, MD  gabapentin (NEURONTIN) 300 MG capsule Take 300 mg by mouth 2 (two) times daily.   Yes Historical Provider, MD  isosorbide mononitrate (IMDUR) 60 MG 24 hr tablet Take 60 mg by mouth at bedtime.    Yes Historical Provider, MD  metoprolol succinate (TOPROL-XL) 100 MG 24 hr tablet Take 1 tablet (100 mg total) by mouth daily. Take with or  immediately following a meal. 01/17/14  Yes Ok Anis, NP  Multiple Vitamins-Minerals (MULTIVITAMIN WITH MINERALS) tablet Take 1 tablet by mouth daily.   Yes Historical Provider, MD  nitroGLYCERIN (NITROSTAT) 0.4 MG SL tablet Place 0.4 mg under the tongue every 5 (five) minutes as needed. For chest pain   Yes Historical Provider, MD  sertraline (ZOLOFT) 100 MG tablet Take 50 mg by mouth at bedtime.    Yes Historical Provider, MD  Tamsulosin HCl (FLOMAX) 0.4 MG CAPS Take 0.4 mg by mouth daily after supper.    Yes Historical Provider, MD   Physical Exam: Filed Vitals:   06/09/14 1545  BP:   Pulse: 71  Temp:   Resp: 25    BP 184/87  Pulse 71  Temp(Src) 97.8  F (36.6 C) (Oral)  Resp 25  Ht 5\' 9"  (1.753 m)  Wt 68.176 kg (150 lb 4.8 oz)  BMI 22.19 kg/m2  SpO2 93%  General:  No acute distress, alert and oriented x3 Eyes: Sclera nonicteric, extraocular movements are intact ENT: Normocephalic, atraumatic, mucous membranes are dry Neck: No JVD Cardiovascular: Regular rate and rhythm, S1-S2, pacemaker noted in chest wall Respiratory: Left basal crackles Abdomen: Soft, nontender, nondistended, positive bowel sounds Skin: No skin breaks, tears or lesions Musculoskeletal: No clubbing or cyanosis, trace edema bilaterally Psychiatric: Patient is appropriate, no evidence of psychoses Neurologic: No focal deficits           Labs on Admission:  Basic Metabolic Panel:  Recent Labs Lab 06/09/14 1359  NA 143  K 3.8  CL 105  CO2 28  GLUCOSE 92  BUN 15  CREATININE 1.36*  CALCIUM 8.7   Liver Function Tests: No results found for this basename: AST, ALT, ALKPHOS, BILITOT, PROT, ALBUMIN,  in the last 168 hours No results found for this basename: LIPASE, AMYLASE,  in the last 168 hours No results found for this basename: AMMONIA,  in the last 168 hours CBC:  Recent Labs Lab 06/09/14 1359  WBC 8.2  NEUTROABS 4.2  HGB 14.5  HCT 42.4  MCV 95.5  PLT 131*   Cardiac  Enzymes:  Recent Labs Lab 06/09/14 1359  TROPONINI <0.30    BNP (last 3 results)  Recent Labs  12/30/13 1123 06/09/14 1359  PROBNP 16965.0* 7675.0*   CBG: No results found for this basename: GLUCAP,  in the last 168 hours  Radiological Exams on Admission: Dg Chest 2 View  06/09/2014   CLINICAL DATA:  Left-sided chest pain.  Previous CABG.  EXAM: CHEST  2 VIEW  COMPARISON:  01/06/2014  FINDINGS: The heart is mildly enlarged. The thoracic aorta is markedly unfolded as seen previously. There has been previous median sternotomy and CABG. Dual lead pacemaker appears unchanged. Upper lungs are clear. There are bilateral pleural effusions and there is patchy density at both lung bases worrisome for pneumonia. No acute bony finding.  IMPRESSION: Worrisome for basilar pneumonia. New patchy density at both lung bases. Small amount of pleural fluid. Upper lungs remain clear.   Electronically Signed   By: Paulina FusiMark  Shogry M.D.   On: 06/09/2014 14:40    EKG: Independently reviewed. Paced AV rhythm with incomplete right bundle branch block and left anterior fascicular block, T-wave abnormalities seen on previous EKG  Assessment/Plan Principal Problem:   CAP (community acquired pneumonia): IV Rocephin and by mouth Zithromax. Stable. Active Problems:   Acute on chronic combined systolic and diastolic CHF (congestive heart failure): BNP significantly elevated. Some of this may be from pneumonia. Gentle diuresis. Daily weights. Echocardiogram done 4/14 notes mildly decreased ejection fraction of 35-40% and grade 1 diastolic dysfunction.    HTN (hypertension): Continue home medications.    Hyperlipidemia: Continue statin   Tobacco abuse: Patient declines nicotine patch.    CAD (coronary artery disease) of artery bypass graft   Malnutrition of moderate degree: Continue supplement. Asked nutrition for consult   CKD (chronic kidney disease) stage 3, GFR 30-59 ml/min: Monitor renal function closely as we  diurese.  Chest pressure: Atypical. Vague. May be related to pneumonia or CHF. Will also cycle cardiac markers.   Code Status: Patient confirms DO NOT RESUSCITATE.  Family Communication: Son and daughter at the bedside.  Disposition Plan: Here in the hospital for several days as we diurese him, treat  pneumonia and monitor cardiac markers  Time spent: 35 minutes  Hollice Espy [facility] Pager [phone]  **Disclaimer: This note may have been dictated with voice recognition software. Similar sounding words can inadvertently be transcribed and this note may contain transcription errors which may not have been corrected upon publication of note.**

## 2014-06-09 NOTE — ED Notes (Signed)
Levoquin continued on admission

## 2014-06-09 NOTE — ED Notes (Signed)
Seen at urgent care this am for chest pain sent herew for eval of chest pressure.  Denies pain states he is having "a little heaviness" to his chest pt unable to rate pain

## 2014-06-09 NOTE — ED Notes (Signed)
Report called to Tina, RN.

## 2014-06-09 NOTE — ED Notes (Signed)
Daughter stated, He's had some chest pain , cough and some dizziness for about a week.

## 2014-06-09 NOTE — ED Provider Notes (Signed)
CSN: [id number]     Arrival date & time 06/09/14  1326 History   First MD Initiated Contact with Patient 06/09/14 1345     Chief Complaint  Patient presents with  . Chest Pain  . Cough  . Dizziness     (Consider location/radiation/quality/duration/timing/severity/associated sxs/prior Treatment) HPI Comments: 80 year old male with chronic systolic heart failure, high blood pressure, lipids, tobacco use, stroke, sick sinus, nstemi, unstable angina, COPD presents with cough and chest pressure. Patient said gradually worsening shortness of breath and mild nonproductive cough for a week however this morning he woke up with severe chest pressure lasting a couple hours. Was seen at urgent care and sent over here for further evaluation. Patient currently has mild cough and no other symptoms. No recent exertional symptoms they started at rest. Patient has not had aspirin today. No fevers or chills.  Patient is a 80 y.o. male presenting with chest pain, cough, and dizziness. The history is provided by the patient.  Chest Pain Associated symptoms: cough and shortness of breath   Associated symptoms: no abdominal pain, no back pain, no dizziness, no fever, no headache and not vomiting   Cough Associated symptoms: chest pain and shortness of breath   Associated symptoms: no chills, no fever, no headaches and no rash   Dizziness Associated symptoms: chest pain and shortness of breath   Associated symptoms: no headaches and no vomiting     Past Medical History  Diagnosis Date  . Hypertension   . CAD (coronary artery disease)     a. s/p MI in 1988;  b. s/p CABG in 2000 (Dr. Barry Dienes) x 4, LIMA to LAD, SVG to diagonal, SVG to [facility], SVG to PDA;  c. 12/2013 NSTEMI/abnl MV;  d. 01/2014 Cath/PCI: native 3VD, VG->RCA 100, VG->OM aneurysmal 40ost/m, 22m(5.0x12 Veriflex BMS), VG->Diag aneurysmal, LIMA->LAD nl.  . TIA (transient ischemic attack)   . Hyperlipidemia   . COPD (chronic obstructive pulmonary disease)    . CVA (cerebral infarction) 07/2011    Remote left brain infarct with sensory deficits and had a left internal carotid stent placed by Dr. [name] at that time, no recurrent CVA or TIAs or CNS events.  Marland Kitchen PAD (peripheral artery disease)     Stable. Had a past RFPBG by Dr. [name] in 2009. this graft is occluded with reconstitution in the popliteal artery, which was patent. Right tibial was occluded and 2-vessel runoff on angiography done by [name] in 12/2010.  . Sick sinus syndrome     PPM implanted for this and syncope   . Chronic systolic CHF (congestive heart failure)     Hospitalization for CHF from 05/04/13-05/06/13. Treated medically. Had possible pneumonia with short course of antibiotics.  . Abdominal bruit     Loud  . AAA (abdominal aortic aneurysm)     Has a known AAA of 3.2 x 3.4 cm by abdominal untrasound 03/27/12. Followup abdominal ultrasound on 03/29/13 showed a diameter of 3.6 cm, which is stable  . Peripheral vascular disease   . Hypothyroid     On supplement "don't know if I take RX or not for my thyroid" (01/16/2014)  . Stroke    Past Surgical History  Procedure Laterality Date  . Femoral bypass Right ?2001  . Carotid stent Left 06/23/11    By [name]  . Coronary artery bypass graft  2000    1st in 2000 (Dr. Barry Dienes) x 4, LIMA to LAD, SVG to diagonal, SVG to OM4, SVG to PDA. Re-cath in  May 2012 with patent LIMA to LAD, patent SVG to diagonal, patent SVG to OM with left-to-right collaterals to an occluded right.  . Coronary angioplasty with stent placement  2001-01/16/2014    "think today makes #4" (01/16/2014)  . Cardiac catheterization  2000  . Inguinal hernia repair Right 1980's  . Insert / replace / remove pacemaker      implanted 2003 by Dr [name] with gen change (MDT ADDRL1) 08/2010 by [facility]  . Cataract extraction w/ intraocular lens  implant, bilateral Bilateral    Family History  Problem Relation Age of Onset  . Cancer Brother 60    oral   . Prostate cancer  Brother   . Hypertension Brother   . Hyperlipidemia Brother   . Lung disease Father     black lung disease   History  Substance Use Topics  . Smoking status: Current Every Day Smoker -- 0.50 packs/day for 68 years    Types: Cigarettes  . Smokeless tobacco: Former NeurosurgeonUser    Quit date: 06/02/2011  . Alcohol Use: No    Review of Systems  Constitutional: Negative for fever and chills.  HENT: Negative for congestion.   Eyes: Negative for visual disturbance.  Respiratory: Positive for cough and shortness of breath.   Cardiovascular: Positive for chest pain and leg swelling (mild lower extremity bilateral chronic).  Gastrointestinal: Negative for vomiting and abdominal pain.  Genitourinary: Negative for dysuria and flank pain.  Musculoskeletal: Negative for back pain, neck pain and neck stiffness.  Skin: Negative for rash.  Neurological: Positive for light-headedness. Negative for dizziness and headaches.      Allergies  Pravachol and Simvastatin  Home Medications   Prior to Admission medications   Medication Sig Start Date End Date Taking? Authorizing Provider  amLODipine (NORVASC) 2.5 MG tablet Take 1 tablet (2.5 mg total) by mouth daily. 04/15/14   Lars MassonKatarina H Nelson, MD  aspirin 81 MG EC tablet TAKE 1 TABLET (81 MG TOTAL) BY MOUTH DAILY. 02/26/14   Lars MassonKatarina H Nelson, MD  atorvastatin (LIPITOR) 40 MG tablet Take 20 mg by mouth at bedtime.    Historical Provider, MD  clopidogrel (PLAVIX) 75 MG tablet Take 1 tablet (75 mg total) by mouth daily with breakfast. 01/17/14   Ok Anishristopher R Berge, NP  feeding supplement, ENSURE COMPLETE, (ENSURE COMPLETE) LIQD Take 237 mLs by mouth 2 (two) times daily between meals. 01/05/14   Renae FickleMackenzie Short, MD  furosemide (LASIX) 20 MG tablet Take 1 tablet (20 mg total) by mouth daily. 04/15/14   Lars MassonKatarina H Nelson, MD  gabapentin (NEURONTIN) 300 MG capsule Take 300 mg by mouth 2 (two) times daily.    Historical Provider, MD  isosorbide mononitrate (IMDUR)  60 MG 24 hr tablet Take 60 mg by mouth at bedtime.     Historical Provider, MD  metoprolol succinate (TOPROL-XL) 100 MG 24 hr tablet Take 1 tablet (100 mg total) by mouth daily. Take with or immediately following a meal. 01/17/14   Ok Anishristopher R Berge, NP  Multiple Vitamins-Minerals (MULTIVITAMIN WITH MINERALS) tablet Take 1 tablet by mouth daily.    Historical Provider, MD  nitroGLYCERIN (NITROSTAT) 0.4 MG SL tablet Place 0.4 mg under the tongue every 5 (five) minutes as needed. For chest pain    Historical Provider, MD  sertraline (ZOLOFT) 100 MG tablet Take 50 mg by mouth at bedtime.     Historical Provider, MD  Tamsulosin HCl (FLOMAX) 0.4 MG CAPS Take 0.4 mg by mouth daily after supper.  Historical Provider, MD   BP 190/102  Pulse 74  Temp(Src) 97.8 F (36.6 C) (Oral)  Resp 20  Ht 5\' 9"  (1.753 m)  Wt 150 lb 4.8 oz (68.176 kg)  BMI 22.19 kg/m2  SpO2 97% Physical Exam  Nursing note and vitals reviewed. Constitutional: He is oriented to person, place, and time. He appears well-developed and well-nourished.  HENT:  Head: Normocephalic and atraumatic.  Eyes: Conjunctivae are normal. Right eye exhibits no discharge. Left eye exhibits no discharge.  Neck: Normal range of motion. Neck supple. No tracheal deviation present.  Cardiovascular: Normal rate and regular rhythm.   Pulmonary/Chest: Effort normal. He has rales (few at bases worse on the left).  Abdominal: Soft. He exhibits no distension. There is no tenderness. There is no guarding.  Musculoskeletal: He exhibits edema (mild anterior tibia bilateral tenderness).  Neurological: He is alert and oriented to person, place, and time.  Skin: Skin is warm. No rash noted.  Psychiatric: He has a normal mood and affect.    ED Course  Procedures (including critical care time) Labs Review Labs Reviewed  BASIC METABOLIC PANEL - Abnormal; Notable for the following:    Creatinine, Ser 1.36 (*)    GFR calc non Af Amer 46 (*)    GFR calc Af  Amer 53 (*)    All other components within normal limits  CBC WITH DIFFERENTIAL - Abnormal; Notable for the following:    Platelets 131 (*)    All other components within normal limits  PRO B NATRIURETIC PEPTIDE - Abnormal; Notable for the following:    Pro B Natriuretic peptide (BNP) 7675.0 (*)    All other components within normal limits  TROPONIN I    Imaging Review Dg Chest 2 View  06/09/2014   CLINICAL DATA:  Left-sided chest pain.  Previous CABG.  EXAM: CHEST  2 VIEW  COMPARISON:  01/06/2014  FINDINGS: The heart is mildly enlarged. The thoracic aorta is markedly unfolded as seen previously. There has been previous median sternotomy and CABG. Dual lead pacemaker appears unchanged. Upper lungs are clear. There are bilateral pleural effusions and there is patchy density at both lung bases worrisome for pneumonia. No acute bony finding.  IMPRESSION: Worrisome for basilar pneumonia. New patchy density at both lung bases. Small amount of pleural fluid. Upper lungs remain clear.   Electronically Signed   By: Paulina FusiMark  Shogry M.D.   On: 06/09/2014 14:40     EKG Interpretation   Date/Time:  Sunday June 09 2014 13:34:12 EDT Ventricular Rate:  82 PR Interval:  338 QRS Duration: 108 QT Interval:  398 QTC Calculation: 464 R Axis:   -45 Text Interpretation:  Atrial-paced rhythm with prolonged AV conduction  Incomplete right bundle branch block Left anterior fascicular block  Anterior infarct , age undetermined T wave abnormality, consider lateral  ischemia Abnormal ECG Inferior T waves resolved, lateral T wave inverions  seen on a previous ekg Confirmed by Markavious Micco  MD, Dacota Ruben ([id number]) on 06/09/2014  1:49:22 PM      MDM   Final diagnoses:  Dyspnea  Chest pain, unspecified chest pain type  CAP (community acquired pneumonia)  Congestive heart failure, unspecified congestive heart failure chronicity, unspecified congestive heart failure type   Patient with known CHF and CAD presents with chest  pressure that recently resolved. Patient has both symptoms of infectious and cardiac. With cardiac history and worsening symptoms plan for cardiac workup and likely observation telemetry. EKG reviewed overall similar to previous. Patient chest pain-free  in ER. Aspirin ordered.  Chest x-ray shows likely pneumonia correlate clinically however patient does have aspect of heart failure with high blood pressure and cardiac history. Discussed the case with Dr. Rito Ehrlich who agrees with treatment both including antibiotics and small dose of Lasix with telemetry admission.  The patients results and plan were reviewed and discussed.   Any x-rays performed were personally reviewed by myself.   Differential diagnosis were considered with the presenting HPI.  Medications  levofloxacin (LEVAQUIN) IVPB 750 mg (not administered)  furosemide (LASIX) injection 20 mg (not administered)  aspirin chewable tablet 324 mg (324 mg Oral Given 06/09/14 1508)      Filed Vitals:   06/09/14 1334 06/09/14 1400  BP: 190/102 170/85  Pulse: 74 71  Temp: 97.8 F (36.6 C)   TempSrc: Oral   Resp: 20 19  Height: 5\' 9"  (1.753 m)   Weight: 150 lb 4.8 oz (68.176 kg)   SpO2: 97% 98%    Admission/ observation were discussed with the admitting physician, patient and/or family and they are comfortable with the plan.     Enid Skeens, MD 06/09/14 [phone]

## 2014-06-10 DIAGNOSIS — N183 Chronic kidney disease, stage 3 unspecified: Secondary | ICD-10-CM

## 2014-06-10 DIAGNOSIS — I10 Essential (primary) hypertension: Secondary | ICD-10-CM

## 2014-06-10 LAB — BASIC METABOLIC PANEL
ANION GAP: 12 (ref 5–15)
BUN: 17 mg/dL (ref 6–23)
CHLORIDE: 106 meq/L (ref 96–112)
CO2: 25 meq/L (ref 19–32)
Calcium: 8.2 mg/dL — ABNORMAL LOW (ref 8.4–10.5)
Creatinine, Ser: 1.41 mg/dL — ABNORMAL HIGH (ref 0.50–1.35)
GFR calc Af Amer: 51 mL/min — ABNORMAL LOW (ref >90)
GFR calc non Af Amer: 44 mL/min — ABNORMAL LOW (ref >90)
Glucose, Bld: 83 mg/dL (ref 70–99)
POTASSIUM: 3.8 meq/L (ref 3.7–5.3)
SODIUM: 143 meq/L (ref 137–147)

## 2014-06-10 LAB — STREP PNEUMONIAE URINARY ANTIGEN: STREP PNEUMO URINARY ANTIGEN: NEGATIVE

## 2014-06-10 LAB — HIV ANTIBODY (ROUTINE TESTING W REFLEX): HIV 1&2 Ab, 4th Generation: NONREACTIVE

## 2014-06-10 LAB — TROPONIN I
Troponin I: <0.3 ng/mL (ref <0.30)
Troponin I: <0.3 ng/mL (ref <0.30)

## 2014-06-10 MED ORDER — ENSURE COMPLETE PO LIQD
237.0000 mL | Freq: Three times a day (TID) | ORAL | Status: DC
  Administered 2014-06-10 – 2014-06-11 (×3): 237 mL via ORAL

## 2014-06-10 NOTE — Progress Notes (Signed)
INITIAL NUTRITION ASSESSMENT  DOCUMENTATION CODES Per approved criteria  -Not Applicable   INTERVENTION: Continue Ensure Complete po TID, each supplement provides 350 kcal and 13 grams of protein Encourage PO intake   NUTRITION DIAGNOSIS: Predicted sub optimal energy intake related to advanced age as evidenced by pt's use of Ensure Plus TID to maintain weight.   Goal: Pt to meet >/= 90% of their estimated nutrition needs  Monitor:  PO intake, weight trend, labs  Reason for Assessment: Consult  80 y.o. male  Admitting Dx: CAP (community acquired pneumonia)  ASSESSMENT: 80 y.o. male past medical history of systolic and diastolic dysfunction plus ongoing tobacco abuse presents the emergency room after a week of increased productive cough and she progressively worsening shortness of breath.  Pt reports that his appetite has been good and he has been eating well. Pt reports eating 2 to 3 meals daily and drinking Ensure Plus 2-3 times daily PTA. He denies weight loss stating he has been maintaining his weight around 145 lbs.  Pt ate 100% of breakfast. Lunch tray at bedside untouched at time of visit- pt states he is allergic to green beans and can't eat any food that has come into contact with green beans. Pt is not interested in ordering a replacement tray. He states his reaction to green beans is full-bodied reaction; nausea, diarrhea, etc.   Nutrition Focused Physical Exam:  Subcutaneous Fat:  Orbital Region: wnl Upper Arm Region: wnl Thoracic and Lumbar Region: NA  Muscle:  Temple Region: moderate wasting Clavicle Bone Region: mild wasting Clavicle and Acromion Bone Region: wnl Scapular Bone Region: NA Dorsal Hand: wnl Patellar Region: mild wasting Anterior Thigh Region: mild wasting Posterior Calf Region: wnl  Edema: none   Height: Ht Readings from Last 1 Encounters:  06/09/14 5\' 9"  (1.753 m)    Weight: Wt Readings from Last 1 Encounters:  06/10/14 145 lb 0.3  oz (65.78 kg)    Ideal Body Weight: 160 lbs  % Ideal Body Weight: 91%  Wt Readings from Last 10 Encounters:  06/10/14 145 lb 0.3 oz (65.78 kg)  04/15/14 146 lb (66.225 kg)  02/12/14 136 lb (61.689 kg)  01/17/14 141 lb 1.5 oz (64 kg)  01/17/14 141 lb 1.5 oz (64 kg)  01/15/14 144 lb 6.4 oz (65.499 kg)  01/06/14 135 lb (61.236 kg)  01/05/14 136 lb 12.8 oz (62.052 kg)  12/13/13 145 lb (65.772 kg)  05/06/13 132 lb 7.9 oz (60.1 kg)    Usual Body Weight: 145 lbs  % Usual Body Weight: 100%  BMI:  Body mass index is 21.41 kg/(m^2).  Estimated Nutritional Needs: Kcal: 1650-1850 Protein: 90-100 grams Fluid: 1.6-1.8 L/day  Skin: intact  Diet Order: Cardiac  EDUCATION NEEDS: -No education needs identified at this time   Intake/Output Summary (Last 24 hours) at 06/10/14 1326 Last data filed at 06/10/14 1227  Gross per 24 hour  Intake    953 ml  Output   2525 ml  Net  -1572 ml    Last BM: 7/5   Labs:   Recent Labs Lab 06/09/14 1359 06/10/14 0228  NA 143 143  K 3.8 3.8  CL 105 106  CO2 28 25  BUN 15 17  CREATININE 1.36* 1.41*  CALCIUM 8.7 8.2*  GLUCOSE 92 83    CBG (last 3)  No results found for this basename: GLUCAP,  in the last 72 hours  Scheduled Meds: . amLODipine  5 mg Oral Daily  . aspirin  81 mg  Oral Daily  . atorvastatin  20 mg Oral QHS  . azithromycin  500 mg Oral Q24H  . cefTRIAXone (ROCEPHIN)  IV  1 g Intravenous Q24H  . clopidogrel  75 mg Oral Q breakfast  . enoxaparin (LOVENOX) injection  40 mg Subcutaneous Q24H  . feeding supplement (ENSURE COMPLETE)  237 mL Oral BID BM  . furosemide  20 mg Intravenous Q12H  . gabapentin  300 mg Oral BID  . isosorbide mononitrate  60 mg Oral QHS  . metoprolol succinate  100 mg Oral Daily  . sertraline  50 mg Oral QHS  . sodium chloride  3 mL Intravenous Q12H  . tamsulosin  0.4 mg Oral QPC supper    Continuous Infusions:   Past Medical History  Diagnosis Date  . Hypertension   . CAD (coronary  artery disease)     a. s/p MI in 1988;  b. s/p CABG in 2000 ([name]) x 4, LIMA to LAD, SVG to diagonal[facility];  c. 12/2013 NSTEMI/abnl MV;  d. 01/2014 Cath/PCI: native 3VD, VG->RCA 100, VG->OM aneurysmal 40ost/m, 4938m(5.0x12 Veriflex BMS), VG->Diag aneurysmal, LIMA->LAD nl.  . TIA (transient ischemic attack)   . Hyperlipidemia   . COPD (chronic obstructive pulmonary disease)   . CVA (cerebral infarction) 07/2011    Remote left brain infarct with sensory deficits and had a left internal carotid stent placed by Dr. Corliss Skainseveshwar at that time, no recurrent CVA or TIAs or CNS events.  Marland Kitchen. PAD (peripheral artery disease)     Stable. Had a past RFPBG by Dr. Arbie CookeyEarly in 2009. this graft is occluded with reconstitution in the popliteal artery, which was patent. Right tibial was occluded and 2-vessel runoff on angiography done by Dr. Myra GianottiBrabham in 12/2010.  . Sick sinus syndrome     PPM implanted for this and syncope   . Chronic systolic CHF (congestive heart failure)     Hospitalization for CHF from 05/04/13-05/06/13. Treated medically. Had possible pneumonia with short course of antibiotics.  . Abdominal bruit     Loud  . AAA (abdominal aortic aneurysm)     Has a known AAA of 3.2 x 3.4 cm by abdominal untrasound 03/27/12. Followup abdominal ultrasound on 03/29/13 showed a diameter of 3.6 cm, which is stable  . Peripheral vascular disease   . Hypothyroid     On supplement "don't know if I take RX or not for my thyroid" (01/16/2014)  . Stroke     Past Surgical History  Procedure Laterality Date  . Femoral bypass Right ?2001  . Carotid stent Left 06/23/11    By Dr. Myra GianottiBrabham  . Coronary artery bypass graft  2000    1st in 2000 ([name]) x 4, LIMA to LAD, SVG to diagonal, SVG to OM4, SVG to PDA. Re-cath in May 2012 with patent LIMA to LAD, patent SVG to diagonal, patent SVG to OM with left-to-right collaterals to an occluded right.  . Coronary angioplasty with stent placement  2001-01/16/2014     "think today makes #4" (01/16/2014)  . Cardiac catheterization  2000  . Inguinal hernia repair Right 1980's  . Insert / replace / remove pacemaker      implanted 2003 by Dr [name] with gen change (MDT ADDRL1) [facility] by [facility]  . Cataract extraction w/ intraocular lens  implant, bilateral Bilateral     Ian Malkineanne Barnett RD, LDN Inpatient Clinical Dietitian Pager: [phone] After Hours Pager: [phone]

## 2014-06-10 NOTE — Progress Notes (Signed)
Patient's oxygen saturation 99% on RA while ambulating. Patient has no complaints of shortness of breath.  Will continue to monitor.

## 2014-06-10 NOTE — Progress Notes (Signed)
Pharmacist Heart Failure Core Measure Documentation  Assessment: [name] has an EF documented as 35-40% on 03/29/13 by echo.  Rationale: Heart failure patients with left ventricular systolic dysfunction (LVSD) and an EF < 40% should be prescribed an angiotensin converting enzyme inhibitor (ACEI) or angiotensin receptor blocker (ARB) at discharge unless a contraindication is documented in the medical record.  This patient is not currently on an ACEI or ARB for HF.  This note is being placed in the record in order to provide documentation that a contraindication to the use of these agents is present for this encounter.  ACE Inhibitor or Angiotensin Receptor Blocker is contraindicated (specify all that apply)  []   ACEI allergy AND ARB allergy []   Angioedema []   Moderate or severe aortic stenosis []   Hyperkalemia []   Hypotension []   Renal artery stenosis [x]   Worsening renal function, preexisting renal disease or dysfunction   [name], [name] 06/10/2014 2:10 PM

## 2014-06-10 NOTE — Progress Notes (Signed)
UR completed Ludmilla Mcgillis K. Emy Angevine, RN, BSN, MSHL, CCM  06/10/2014 3:56 PM

## 2014-06-10 NOTE — Care Management Note (Addendum)
  Page 1 of 1   06/11/2014     4:40:47 PM CARE MANAGEMENT NOTE 06/11/2014  Patient:  [name],[name]   Account Number:  [id number]  Date Initiated:  06/10/2014  Documentation initiated by:  Donato SchultzHUTCHINSON,Luvinia Lucy  Subjective/Objective Assessment:   CAP, CHF     Action/Plan:   CM to follow for disposition needs   Anticipated DC Date:  06/13/2014   Anticipated DC Plan:  HOME/SELF CARE         Choice offered to / List presented to:             Status of service:  Completed, signed off Medicare Important Message given?  NA - LOS <3 / Initial given by admissions (If response is "NO", the following Medicare IM given date fields will be blank) Date Medicare IM given:   Medicare IM given by:   Date Additional Medicare IM given:   Additional Medicare IM given by:    Discharge Disposition:  HOME/SELF CARE  Per UR Regulation:  Reviewed for med. necessity/level of care/duration of stay  If discussed at Long Length of Stay Meetings, dates discussed:    Comments:  Shunda Rabadi RN, BSN, MSHL, CCM  Nurse - Case Manager,  ([facility])  [phone]  06/11/2014 Initial IM by admissions on 06/09/2014 Social:  From home - hx/o patients wife passed away on 3[facility] one year ago. Disposition Plan:  Home / Self care.

## 2014-06-10 NOTE — Progress Notes (Signed)
Pt reports having orthostatic hypotension and falling due to this last month. Orthostatic vital signs were obtained by RN after hearing of this incident. Pt reports "having this for 50 years" and other instances of falls at home. Pt instructed to make RN aware if pt plans to get out of bed. Bed alarm turned on by RN. Will continue to monitor.

## 2014-06-10 NOTE — Plan of Care (Signed)
Problem: Phase I Progression Outcomes Goal: EF % per last Echo/documented,Core Reminder form on chart Outcome: Completed/Met Date Met:  06/10/14 EF 35-40% per ECHO performed on 03/29/14.

## 2014-06-10 NOTE — Progress Notes (Signed)
[facility] PROGRESS NOTE  [name] ZOX:[id number] DOB: 11/27/1927 DOA: 06/09/2014 PCP: Delorse LekBURNETT,BRENT A, MD  Assessment/Plan: Principal Problem:   CAP (community acquired pneumonia): Continue antibiotics. White blood cell count normalized so we'll discontinue IV Rocephin Active Problems:   Acute on chronic combined systolic and diastolic CHF (congestive heart failure): Doing better. Has diuresed 1.5 L.weight is down about 5 pounds. Continue Lasix.    HTN (hypertension):blood pressure remaining stable.    Hyperlipidemia: Continue on statin.    Tobacco abuse: Patient counseled. Declined nicotine patch.    CAD (coronary artery disease) of artery bypass graft   Malnutrition of moderate degree   CKD (chronic kidney disease) stage 3, GFR 30-59 ml/min: Stable. Creatinine at baseline     Code Status: DO NOT RESUSCITATE  Family Communication: left message with family.  Disposition Plan: home likely tomorrow   Consultants:  none  Procedures:  none  Antibiotics: Levaquin 7/5 only IV Rocephin 7/5-present By mouth Zithromax 7/6-present  HPI/Subjective: Patient doing okay. Feels like breathing is easier. No chest pressure or pain. Cough much better.  Objective: Filed Vitals:   06/10/14 1033  BP: 146/72  Pulse: 70  Temp:   Resp:     Intake/Output Summary (Last 24 hours) at 06/10/14 1338 Last data filed at 06/10/14 1227  Gross per 24 hour  Intake    953 ml  Output   2525 ml  Net  -1572 ml   Filed Weights   06/09/14 1334 06/09/14 1640 06/10/14 0552  Weight: 68.176 kg (150 lb 4.8 oz) 66.4 kg (146 lb 6.2 oz) 65.78 kg (145 lb 0.3 oz)    Exam:   General:  Alert and oriented x3, no acute distress  Cardiovascular: regular rate and rhythm, S1-S2, 2/6 systolic ejection murmur  Respiratory: decreased breath sounds bibasilar  Abdomen: soft, nontender, nondistended, positive bowel sounds  Musculoskeletal: no clubbing or cyanosis, trace pitting edema   Data  Reviewed: Basic Metabolic Panel:  Recent Labs Lab 06/09/14 1359 06/10/14 0228  NA 143 143  K 3.8 3.8  CL 105 106  CO2 28 25  GLUCOSE 92 83  BUN 15 17  CREATININE 1.36* 1.41*  CALCIUM 8.7 8.2*   Liver Function Tests: No results found for this basename: AST, ALT, ALKPHOS, BILITOT, PROT, ALBUMIN,  in the last 168 hours No results found for this basename: LIPASE, AMYLASE,  in the last 168 hours No results found for this basename: AMMONIA,  in the last 168 hours CBC:  Recent Labs Lab 06/09/14 1359  WBC 8.2  NEUTROABS 4.2  HGB 14.5  HCT 42.4  MCV 95.5  PLT 131*   Cardiac Enzymes:  Recent Labs Lab 06/09/14 1359 06/09/14 2130 06/10/14 0228 06/10/14 0813  TROPONINI <0.30 <0.30 <0.30 <0.30   BNP (last 3 results)  Recent Labs  12/30/13 1123 06/09/14 1359  PROBNP 16965.0* 7675.0*   CBG: No results found for this basename: GLUCAP,  in the last 168 hours  No results found for this or any previous visit (from the past 240 hour(s)).   Studies: Dg Chest 2 View  06/09/2014   IMPRESSION: Worrisome for basilar pneumonia. New patchy density at both lung bases. Small amount of pleural fluid. Upper lungs remain clear.   Electronically Signed   By: Paulina FusiMark  Shogry M.D.   On: 06/09/2014 14:40    Scheduled Meds: . amLODipine  5 mg Oral Daily  . aspirin  81 mg Oral Daily  . atorvastatin  20 mg Oral QHS  . azithromycin  500  mg Oral Q24H  . cefTRIAXone (ROCEPHIN)  IV  1 g Intravenous Q24H  . clopidogrel  75 mg Oral Q breakfast  . enoxaparin (LOVENOX) injection  40 mg Subcutaneous Q24H  . feeding supplement (ENSURE COMPLETE)  237 mL Oral TID BM  . furosemide  20 mg Intravenous Q12H  . gabapentin  300 mg Oral BID  . isosorbide mononitrate  60 mg Oral QHS  . metoprolol succinate  100 mg Oral Daily  . sertraline  50 mg Oral QHS  . sodium chloride  3 mL Intravenous Q12H  . tamsulosin  0.4 mg Oral QPC supper   Continuous Infusions:   Principal Problem:   CAP (community  acquired pneumonia) Active Problems:   Acute on chronic combined systolic and diastolic CHF (congestive heart failure)   HTN (hypertension)   Hyperlipidemia   Tobacco abuse   CAD (coronary artery disease) of artery bypass graft   Malnutrition of moderate degree   CKD (chronic kidney disease) stage 3, GFR 30-59 ml/min   CHF (congestive heart failure)    Time spent: 25 minutes    Hollice EspyKRISHNAN,Daissy Yerian K  [facility] Pager [phone]. If 7PM-7AM, please contact night-coverage at [email or web address], password [email or web address] 06/10/2014, 1:38 PM  LOS: 1 day

## 2014-06-11 DIAGNOSIS — I5042 Chronic combined systolic (congestive) and diastolic (congestive) heart failure: Secondary | ICD-10-CM

## 2014-06-11 LAB — CBC
HEMATOCRIT: 39 % (ref 39.0–52.0)
HEMOGLOBIN: 13 g/dL (ref 13.0–17.0)
MCH: 31.6 pg (ref 26.0–34.0)
MCHC: 33.3 g/dL (ref 30.0–36.0)
MCV: 94.9 fL (ref 78.0–100.0)
Platelets: 134 10*3/uL — ABNORMAL LOW (ref 150–400)
RBC: 4.11 MIL/uL — ABNORMAL LOW (ref 4.22–5.81)
RDW: 15.1 % (ref 11.5–15.5)
WBC: 7.3 10*3/uL (ref 4.0–10.5)

## 2014-06-11 LAB — BASIC METABOLIC PANEL
ANION GAP: 13 (ref 5–15)
BUN: 22 mg/dL (ref 6–23)
CALCIUM: 8.5 mg/dL (ref 8.4–10.5)
CO2: 28 meq/L (ref 19–32)
Chloride: 104 mEq/L (ref 96–112)
Creatinine, Ser: 1.65 mg/dL — ABNORMAL HIGH (ref 0.50–1.35)
GFR calc Af Amer: 42 mL/min — ABNORMAL LOW (ref >90)
GFR calc non Af Amer: 36 mL/min — ABNORMAL LOW (ref >90)
GLUCOSE: 159 mg/dL — AB (ref 70–99)
Potassium: 3.4 mEq/L — ABNORMAL LOW (ref 3.7–5.3)
SODIUM: 145 meq/L (ref 137–147)

## 2014-06-11 LAB — LEGIONELLA ANTIGEN, URINE: Legionella Antigen, Urine: NEGATIVE

## 2014-06-11 MED ORDER — AZITHROMYCIN 500 MG PO TABS: 500.0000 mg | ORAL_TABLET | ORAL | Status: DC

## 2014-06-11 MED ORDER — ENOXAPARIN SODIUM 30 MG/0.3ML ~~LOC~~ SOLN
30.0000 mg | SUBCUTANEOUS | Status: DC
  Filled 2014-06-11: qty 0.3

## 2014-06-11 MED ORDER — NITROGLYCERIN 0.4 MG SL SUBL: 0.4000 mg | SUBLINGUAL_TABLET | SUBLINGUAL | Status: DC | PRN

## 2014-06-11 NOTE — Discharge Instructions (Signed)

## 2014-06-13 NOTE — Discharge Summary (Signed)
Physician Discharge Summary  [name] ZOX:[id number] DOB: 01/13/1927 DOA: 06/09/2014  PCP: Delorse LekBURNETT,BRENT A, MD  Admit date: 06/09/2014 Discharge date: 06/13/2014  Time spent: 25 minutes  Recommendations for Outpatient Follow-up:  1. New medication: Zithromax 500 mg by mouth daily x4 more days 2. Patient will followup with cardiology in the next one week 3. He'll followup with his primary care physician in the next 2-3 weeks  Discharge Diagnoses:  Principal Problem:   CAP (community acquired pneumonia) Active Problems:   Acute on chronic combined systolic and diastolic CHF (congestive heart failure)   HTN (hypertension)   Hyperlipidemia   Tobacco abuse   CAD (coronary artery disease) of artery bypass graft   Malnutrition of moderate degree   CKD (chronic kidney disease) stage 3, GFR 30-59 ml/min   CHF (congestive heart failure)   Discharge Condition: Improved, being discharged home  Diet recommendation: Heart healthy  Filed Weights   06/09/14 1640 06/10/14 0552 06/11/14 0600  Weight: 66.4 kg (146 lb 6.2 oz) 65.78 kg (145 lb 0.3 oz) 62.642 kg (138 lb 1.6 oz)    History of present illness:  Patient is an 80 year old male with past medical history of ongoing tobacco abuse plus systolic and diastolic dysfunction was noted progressively worsening shortness of breath with cough in the past week and much more so in the past 24 hours prior to admission on 7/6 with some mild chest pressure. He came to the emergency room and was noted to have a moderately elevated BNP at 7675 and chest x-ray noting small pleural effusions but bibasilar infiltrate concerning for pneumonia. Patient was started on Lasix, IV antibiotics and oxygen and hospitalists were called for admission  Hospital Course:  Principal Problem:   CAP (community acquired pneumonia): Initially put on IV antibiotics, but with improving oxygenation and normal white count, likely change over to by mouth. He'll be discharged on a  few more days of by mouth antibiotics Active Problems:   Acute on chronic combined systolic and diastolic CHF (congestive heart failure): Patient treated with IV Lasix. He diuresed approximately 2.3 L. By day of discharge, he was breathing comfortably on room air and able to be discharged home. Will continue on his ACE inhibitor beta blocker.    HTN (hypertension): Stable. With diuresis, blood pressures normalized. Continue on home meds upon discharge.    Hyperlipidemia: Stable. Continue on statin.    Tobacco abuse: Counseled to quit. Felt like he did not need nicotine patch during his hospitalization.   CAD (coronary artery disease) of artery bypass graft: Stable.     CKD (chronic kidney disease) stage 3, GFR 30-59 ml/min: Stabilized. Creatinine of 1.65 upon discharge   Procedures:  None  Consultations:  None  Discharge Exam: Filed Vitals:   06/11/14 1434  BP: 121/69  Pulse: 74  Temp: 97.8 F (36.6 C)  Resp: 18    General: Alert and oriented x3, no acute distress Cardiovascular: Regular rate and rhythm, S1-S2, 2/6 systolic ejection murmur Respiratory: Clear to auscultation bilaterally  Discharge Instructions You were cared for by a hospitalist during your hospital stay. If you have any questions about your discharge medications or the care you received while you were in the hospital after you are discharged, you can call the unit and asked to speak with the hospitalist on call if the hospitalist that took care of you is not available. Once you are discharged, your primary care physician will handle any further medical issues. Please note that NO REFILLS for any discharge  medications will be authorized once you are discharged, as it is imperative that you return to your primary care physician (or establish a relationship with a primary care physician if you do not have one) for your aftercare needs so that they can reassess your need for medications and monitor your lab  values.  Discharge Instructions   Diet - low sodium heart healthy    Complete by:  As directed      Increase activity slowly    Complete by:  As directed             Medication List         amLODipine 10 MG tablet  Commonly known as:  NORVASC  Take 5 mg by mouth daily.     aspirin 81 MG EC tablet  TAKE 1 TABLET (81 MG TOTAL) BY MOUTH DAILY.     atorvastatin 40 MG tablet  Commonly known as:  LIPITOR  Take 20 mg by mouth at bedtime.     azithromycin 500 MG tablet  Commonly known as:  ZITHROMAX  Take 1 tablet (500 mg total) by mouth daily.     clopidogrel 75 MG tablet  Commonly known as:  PLAVIX  Take 1 tablet (75 mg total) by mouth daily with breakfast.     feeding supplement (ENSURE COMPLETE) Liqd  Take 237 mLs by mouth 2 (two) times daily between meals.     furosemide 20 MG tablet  Commonly known as:  LASIX  Take 1 tablet (20 mg total) by mouth daily.     gabapentin 300 MG capsule  Commonly known as:  NEURONTIN  Take 300 mg by mouth 2 (two) times daily.     isosorbide mononitrate 60 MG 24 hr tablet  Commonly known as:  IMDUR  Take 60 mg by mouth at bedtime.     metoprolol succinate 100 MG 24 hr tablet  Commonly known as:  TOPROL-XL  Take 1 tablet (100 mg total) by mouth daily. Take with or immediately following a meal.     multivitamin with minerals tablet  Take 1 tablet by mouth daily.     nitroGLYCERIN 0.4 MG SL tablet  Commonly known as:  NITROSTAT  Place 1 tablet (0.4 mg total) under the tongue every 5 (five) minutes as needed. For chest pain     sertraline 100 MG tablet  Commonly known as:  ZOLOFT  Take 50 mg by mouth at bedtime.     tamsulosin 0.4 MG Caps capsule  Commonly known as:  FLOMAX  Take 0.4 mg by mouth daily after supper.       Allergies  Allergen Reactions  . Pravachol     myalgias  . Simvastatin     myalgia  . Terazosin        Follow-up Information   Follow up with BURNETT,BRENT A, MD In 1 month. (As needed)     Specialty:  Family Medicine   Contact information:   [address] [place] [address] [phone]        The results of significant diagnostics from this hospitalization (including imaging, microbiology, ancillary and laboratory) are listed below for reference.    Significant Diagnostic Studies: Dg Chest 2 View  06/09/2014   CLINICAL DATA:  Left-sided chest pain.  Previous CABG.  EXAM: CHEST  2 VIEW  COMPARISON:  01/06/2014  FINDINGS: The heart is mildly enlarged. The thoracic aorta is markedly unfolded as seen previously. There has been previous median sternotomy and  CABG. Dual lead pacemaker appears unchanged. Upper lungs are clear. There are bilateral pleural effusions and there is patchy density at both lung bases worrisome for pneumonia. No acute bony finding.  IMPRESSION: Worrisome for basilar pneumonia. New patchy density at both lung bases. Small amount of pleural fluid. Upper lungs remain clear.   Electronically Signed   By: Paulina Fusi M.D.   On: 06/09/2014 14:40    Microbiology: Recent Results (from the past 240 hour(s))  CULTURE, BLOOD (ROUTINE X 2)     Status: None   Collection Time    06/09/14  9:30 PM      Result Value Ref Range Status   Specimen Description BLOOD RIGHT ARM   Final   Special Requests BOTTLES DRAWN AEROBIC AND ANAEROBIC 5CC EACH   Final   Culture  Setup Time     Final   Value: 06/10/2014 00:52     Performed at [facility]   Culture     Final   Value:        BLOOD CULTURE RECEIVED NO GROWTH TO DATE CULTURE WILL BE HELD FOR 5 DAYS BEFORE ISSUING A FINAL NEGATIVE REPORT     Performed at [facility]   Report Status PENDING   Incomplete  CULTURE, BLOOD (ROUTINE X 2)     Status: None   Collection Time    06/09/14  9:40 PM      Result Value Ref Range Status   Specimen Description BLOOD RIGHT HAND   Final   Special Requests BOTTLES DRAWN AEROBIC AND ANAEROBIC 5CC EACH   Final   Culture  Setup Time     Final   Value:  06/10/2014 00:52     Performed at [facility]   Culture     Final   Value:        BLOOD CULTURE RECEIVED NO GROWTH TO DATE CULTURE WILL BE HELD FOR 5 DAYS BEFORE ISSUING A FINAL NEGATIVE REPORT     Performed at [facility]   Report Status PENDING   Incomplete     Labs: Basic Metabolic Panel:  Recent Labs Lab 06/09/14 1359 06/10/14 0228 06/11/14 0850  NA 143 143 145  K 3.8 3.8 3.4*  CL 105 106 104  CO2 28 25 28   GLUCOSE 92 83 159*  BUN 15 17 22   CREATININE 1.36* 1.41* 1.65*  CALCIUM 8.7 8.2* 8.5   Liver Function Tests: No results found for this basename: AST, ALT, ALKPHOS, BILITOT, PROT, ALBUMIN,  in the last 168 hours No results found for this basename: LIPASE, AMYLASE,  in the last 168 hours No results found for this basename: AMMONIA,  in the last 168 hours CBC:  Recent Labs Lab 06/09/14 1359 06/11/14 0850  WBC 8.2 7.3  NEUTROABS 4.2  --   HGB 14.5 13.0  HCT 42.4 39.0  MCV 95.5 94.9  PLT 131* 134*   Cardiac Enzymes:  Recent Labs Lab 06/09/14 1359 06/09/14 2130 06/10/14 0228 06/10/14 0813  TROPONINI <0.30 <0.30 <0.30 <0.30   BNP: BNP (last 3 results)  Recent Labs  12/30/13 1123 06/09/14 1359  PROBNP 16965.0* 7675.0*   CBG: No results found for this basename: GLUCAP,  in the last 168 hours     Signed:  Hollice Espy  [facility] 06/13/2014, 3:31 PM

## 2014-06-16 LAB — CULTURE, BLOOD (ROUTINE X 2)
CULTURE: NO GROWTH
Culture: NO GROWTH

## 2014-07-16 ENCOUNTER — Encounter: Payer: Self-pay | Admitting: Cardiology

## 2014-07-16 ENCOUNTER — Encounter: Payer: Self-pay | Admitting: Internal Medicine

## 2014-07-16 ENCOUNTER — Ambulatory Visit (INDEPENDENT_AMBULATORY_CARE_PROVIDER_SITE_OTHER): Payer: Medicare Other | Admitting: Cardiology

## 2014-07-16 ENCOUNTER — Ambulatory Visit (INDEPENDENT_AMBULATORY_CARE_PROVIDER_SITE_OTHER): Payer: Medicare Other | Admitting: *Deleted

## 2014-07-16 ENCOUNTER — Other Ambulatory Visit: Payer: Medicare Other

## 2014-07-16 VITALS — BP 162/90 | HR 80 | Ht 69.0 in | Wt 147.8 lb

## 2014-07-16 DIAGNOSIS — I214 Non-ST elevation (NSTEMI) myocardial infarction: Secondary | ICD-10-CM

## 2014-07-16 DIAGNOSIS — N179 Acute kidney failure, unspecified: Secondary | ICD-10-CM

## 2014-07-16 DIAGNOSIS — N183 Chronic kidney disease, stage 3 unspecified: Secondary | ICD-10-CM

## 2014-07-16 DIAGNOSIS — N189 Chronic kidney disease, unspecified: Secondary | ICD-10-CM

## 2014-07-16 DIAGNOSIS — I495 Sick sinus syndrome: Secondary | ICD-10-CM

## 2014-07-16 DIAGNOSIS — E785 Hyperlipidemia, unspecified: Secondary | ICD-10-CM

## 2014-07-16 DIAGNOSIS — I509 Heart failure, unspecified: Secondary | ICD-10-CM

## 2014-07-16 DIAGNOSIS — I5043 Acute on chronic combined systolic (congestive) and diastolic (congestive) heart failure: Secondary | ICD-10-CM

## 2014-07-16 DIAGNOSIS — I5042 Chronic combined systolic (congestive) and diastolic (congestive) heart failure: Secondary | ICD-10-CM

## 2014-07-16 DIAGNOSIS — Z8673 Personal history of transient ischemic attack (TIA), and cerebral infarction without residual deficits: Secondary | ICD-10-CM

## 2014-07-16 DIAGNOSIS — I6529 Occlusion and stenosis of unspecified carotid artery: Secondary | ICD-10-CM

## 2014-07-16 DIAGNOSIS — I25810 Atherosclerosis of coronary artery bypass graft(s) without angina pectoris: Secondary | ICD-10-CM

## 2014-07-16 DIAGNOSIS — I200 Unstable angina: Secondary | ICD-10-CM

## 2014-07-16 LAB — COMPREHENSIVE METABOLIC PANEL
ALT: 16 U/L (ref 0–53)
AST: 23 U/L (ref 0–37)
Albumin: 3.6 g/dL (ref 3.5–5.2)
Alkaline Phosphatase: 84 U/L (ref 39–117)
BUN: 16 mg/dL (ref 6–23)
CO2: 24 mEq/L (ref 19–32)
Calcium: 8.7 mg/dL (ref 8.4–10.5)
Chloride: 104 mEq/L (ref 96–112)
Creatinine, Ser: 1.4 mg/dL (ref 0.4–1.5)
GFR: 59.91 mL/min — ABNORMAL LOW (ref >60.00)
Glucose, Bld: 85 mg/dL (ref 70–99)
Potassium: 3.4 mEq/L — ABNORMAL LOW (ref 3.5–5.1)
Sodium: 141 mEq/L (ref 135–145)
Total Bilirubin: 1.3 mg/dL — ABNORMAL HIGH (ref 0.2–1.2)
Total Protein: 7.1 g/dL (ref 6.0–8.3)

## 2014-07-16 LAB — MDC_IDC_ENUM_SESS_TYPE_INCLINIC
Battery Impedance: 801 Ohm
Battery Remaining Longevity: 58 mo
Battery Voltage: 2.77 V
Brady Statistic AP VP Percent: 2 %
Brady Statistic AS VP Percent: 0 %
Brady Statistic AS VS Percent: 9 %
Date Time Interrogation Session: 20150811100159
Lead Channel Impedance Value: 762 Ohm
Lead Channel Pacing Threshold Amplitude: 0.75 V
Lead Channel Pacing Threshold Amplitude: 1.5 V
Lead Channel Sensing Intrinsic Amplitude: 15.67 mV
Lead Channel Sensing Intrinsic Amplitude: 2 mV
Lead Channel Setting Pacing Amplitude: 2.5 V
Lead Channel Setting Pacing Amplitude: 2.75 V
Lead Channel Setting Pacing Pulse Width: 0.76 ms
MDC IDC MSMT LEADCHNL RA IMPEDANCE VALUE: 574 Ohm
MDC IDC MSMT LEADCHNL RA PACING THRESHOLD PULSEWIDTH: 0.76 ms
MDC IDC MSMT LEADCHNL RV PACING THRESHOLD PULSEWIDTH: 0.76 ms
MDC IDC SET LEADCHNL RV SENSING SENSITIVITY: 4 mV
MDC IDC STAT BRADY AP VS PERCENT: 89 %

## 2014-07-16 MED ORDER — ISOSORBIDE MONONITRATE ER 60 MG PO TB24: 60.0000 mg | ORAL_TABLET | Freq: Every day | ORAL | Status: DC

## 2014-07-16 MED ORDER — AMLODIPINE BESYLATE 10 MG PO TABS: 5.0000 mg | ORAL_TABLET | Freq: Every day | ORAL | Status: DC

## 2014-07-16 MED ORDER — METOPROLOL SUCCINATE ER 200 MG PO TB24: 100.0000 mg | ORAL_TABLET | Freq: Every morning | ORAL | Status: DC

## 2014-07-16 NOTE — Patient Instructions (Signed)
Your physician recommends that you continue on your current medications as directed. Please refer to the Current Medication list given to you today.  PLEASE TAKE YOUR METOPROLOL EVERY MORNING WITH BREAKFAST  PLEASE TAKE YOUR NORVASC AND YOUR IMDUR AT NIGHT AFTER SUPPER  Your physician recommends that you return for lab work in: TODAY (CMET)  Your physician recommends that you schedule a follow-up appointment in: 4 MONTHS WITH DR Delton SeeNELSON  PLEASE GO TO HAVE YOUR DEVICE CHECKED TODAY

## 2014-07-16 NOTE — Progress Notes (Signed)
Pacemaker check in clinic. Normal device function. Thresholds, sensing, impedances consistent with previous measurements. Device programmed to maximize longevity. 5 mode switches the longest 1:09 minutes, + plavix.  1 high ventricular rates noted 5 seconds. Device programmed at appropriate safety margins. Histogram distribution appropriate for patient activity level. Device programmed to optimize intrinsic conduction. Estimated longevity 5 years. Patient enrolled in remote follow-up/TTM's with [employer]. Plan to follow every 3 months remotely and see annually in office. Patient education completed.  ROV 6 months with Dr. Ladona Ridgelaylor and is also [facility] @ [facility].

## 2014-07-16 NOTE — Progress Notes (Signed)
Patient ID: [name], male   DOB: [birth year], 80 y.o.   MRN: [id number]    Delorse Lek, MD: Primary Cardiologist: former [name] - followed at the [facility] Caponi is a 80 y.o. male with a h/o symptomatic sinus bradycardia and syncope sp PPM (MDT) by Dr Jenne Campus with generator change 08/2010 by the [facility], seen by Dr [name] on 12/13/2013. His past medical history is significant for CAD, s/p CABG, PVD, CVA, COPD, and hypothyroidism.  He remains very active for his age and has no functional limitations.  Unfortunately, he continues to smoke and has not desire to quit. He is grieving his spouse who died several years ago (after 59 years and 1 day of marriage).  He appears to live alone now.  He was admitted on 12/30/2013 with acute bronchitis and NSTEMI with maximum TnI 5.03 in the settings of an acute infection. In hospital Lexiscan showed mid and basal inferolateral scar with peri-infarct ischemia.  The patient is coming 1 week after discharge, he complains of profound fatigue and DOE. He states that he felt like this before his CABG surgery.   His stress test was positive for inferolateral scar with some reversibility and underwent cardiac cath on 01/17/14 with findings of severe disease in body of SVG to OM  S/p successful PTCA/bare metal stent x 1 mid body of SVG to OM.  Today the patient states that he feels great, he is back to repairing cars and denies any chest pain, shortness of breath or fatigue that was his main presentation before. He has orthostatic hypotension has also resolved. He denies any palpitations or syncope. His only complaint today is mild lower extremity edema that is up to his knees.  07/20/2014 - the patient complains of dizzy spells, he recently had a syncopal episode at the [facility] when he was waiting in line. No palpitations, no chest pain or shortness of breath. He stays active. He is compliant with his meds and buttocks all of them at night.   Past Medical History   Diagnosis Date  . Hypertension   . CAD (coronary artery disease)     a. s/p MI in 1988;  b. s/p CABG in 2000 (Dr. Barry Dienes) x 4, LIMA to LAD, SVG to diagonal, SVG to [facility], SVG to PDA;  c. 12/2013 NSTEMI/abnl MV;  d. 01/2014 Cath/PCI: native 3VD, VG->RCA 100, VG->OM aneurysmal 40ost/m, 35m(5.0x12 Veriflex BMS), VG->Diag aneurysmal, LIMA->LAD nl.  . TIA (transient ischemic attack)   . Hyperlipidemia   . COPD (chronic obstructive pulmonary disease)   . CVA (cerebral infarction) 07/2011    Remote left brain infarct with sensory deficits and had a left internal carotid stent placed by Dr. [name] at that time, no recurrent CVA or TIAs or CNS events.  Marland Kitchen PAD (peripheral artery disease)     Stable. Had a past RFPBG by Dr. [name] in 2009. this graft is occluded with reconstitution in the popliteal artery, which was patent. Right tibial was occluded and 2-vessel runoff on angiography done by [name] in 12/2010.  . Sick sinus syndrome     PPM implanted for this and syncope   . Chronic systolic CHF (congestive heart failure)     Hospitalization for CHF from 05/04/13-05/06/13. Treated medically. Had possible pneumonia with short course of antibiotics.  . Abdominal bruit     Loud  . AAA (abdominal aortic aneurysm)     Has a known AAA of 3.2 x 3.4 cm by abdominal untrasound 03/27/12. Followup abdominal ultrasound  on 03/29/13 showed a diameter of 3.6 cm, which is stable  . Peripheral vascular disease   . Hypothyroid     On supplement "don't know if I take RX or not for my thyroid" (01/16/2014)  . Stroke    Past Surgical History  Procedure Laterality Date  . Femoral bypass Right ?2001  . Carotid stent Left 06/23/11    By [name]  . Coronary artery bypass graft  2000    1st in 2000 (Dr. Barry Dienes) x 4, LIMA to LAD, SVG to diagonal, SVG to OM4, SVG to PDA. Re-cath in May 2012 with patent LIMA to LAD, patent SVG to diagonal, patent SVG to OM with left-to-right collaterals to an occluded right.  . Coronary  angioplasty with stent placement  2001-01/16/2014    "think today makes #4" (01/16/2014)  . Cardiac catheterization  2000  . Inguinal hernia repair Right 1980's  . Insert / replace / remove pacemaker      implanted 2003 by Dr Jenne Campus with gen change (MDT ADDRL1) 08/2010 by [facility]  . Cataract extraction w/ intraocular lens  implant, bilateral Bilateral     History   Social History  . Marital Status: Widowed    Spouse Name: N/A    Number of Children: N/A  . Years of Education: N/A   Occupational History  . Not on file.   Social History Main Topics  . Smoking status: Current Every Day Smoker -- 0.50 packs/day for 68 years    Types: Cigarettes  . Smokeless tobacco: Former Neurosurgeon    Quit date: 06/02/2011  . Alcohol Use: No  . Drug Use: No  . Sexual Activity: Yes   Other Topics Concern  . Not on file   Social History Narrative   Lives in [place], spouse is now diseased.  Receives most care at the [facility].    Family History  Problem Relation Age of Onset  . Cancer Brother 60    oral   . Prostate cancer Brother   . Hypertension Brother   . Hyperlipidemia Brother   . Lung disease Father     black lung disease    Allergies  Allergen Reactions  . Pravachol     myalgias  . Simvastatin     myalgia  . Terazosin     Current Outpatient Prescriptions  Medication Sig Dispense Refill  . amLODipine (NORVASC) 10 MG tablet Take 5 mg by mouth daily.      Marland Kitchen aspirin 81 MG EC tablet TAKE 1 TABLET (81 MG TOTAL) BY MOUTH DAILY.  90 tablet  0  . atorvastatin (LIPITOR) 40 MG tablet Take 20 mg by mouth at bedtime.      . clopidogrel (PLAVIX) 75 MG tablet Take 1 tablet (75 mg total) by mouth daily with breakfast.  30 tablet  6  . feeding supplement, ENSURE COMPLETE, (ENSURE COMPLETE) LIQD Take 237 mLs by mouth 2 (two) times daily between meals.  60 Bottle  0  . furosemide (LASIX) 20 MG tablet Take 1 tablet (20 mg total) by mouth daily.  90 tablet  3  . gabapentin (NEURONTIN) 300 MG  capsule Take 300 mg by mouth 2 (two) times daily.      . isosorbide mononitrate (IMDUR) 60 MG 24 hr tablet Take 60 mg by mouth at bedtime.       . metoprolol (TOPROL-XL) 200 MG 24 hr tablet Take 100 mg by mouth daily.      . Multiple Vitamins-Minerals (MULTIVITAMIN WITH MINERALS) tablet Take  1 tablet by mouth daily.      . nitroGLYCERIN (NITROSTAT) 0.4 MG SL tablet Place 1 tablet (0.4 mg total) under the tongue every 5 (five) minutes as needed. For chest pain  20 tablet  12  . Tamsulosin HCl (FLOMAX) 0.4 MG CAPS Take 0.4 mg by mouth daily after supper.       . sertraline (ZOLOFT) 100 MG tablet Take 50 mg by mouth at bedtime.        No current facility-administered medications for this visit.    ROS- all systems are reviewed and negative except as per HPI  Physical Exam: Blood pressure 128/68, pulse 76.  GEN- The patient is well appearing, alert and oriented x 3 today.   Head- normocephalic, atraumatic Eyes-  Sclera clear, conjunctiva pink Ears- hearing intact Oropharynx- clear Neck- supple  Lungs- Clear to ausculation bilaterally, normal work of breathing Chest- pacemaker pocket is well healed Heart- Regular rate and rhythm  GI- soft, NT, ND, + BS Extremities- no clubbing, cyanosis, lower extremities - 1+ edema up to the knees. MS- no significant deformity or atrophy Skin- no rash or lesion Psych- euthymic mood, full affect Neuro- strength and sensation are intact  Pacemaker interrogation- reviewed in detail today,  See PACEART report Dr Kandis Cocking records are reviewed today  ECHO 03/29/13  Study Conclusions  - Left ventricle: The cavity size was mildly dilated. Systolic function was moderately reduced. The estimated ejection fraction was in the range of 35% to 40%. Moderate hypokinesis of the inferolateral myocardium. Mild hypokinesis of the inferior myocardium. Doppler parameters are consistent with abnormal left ventricular relaxation (grade 1 diastolic dysfunction). -  Aortic valve: Mild regurgitation. - Mitral valve: Calcified annulus. Mild regurgitation. - Left atrium: The atrium was mildly dilated. - Right ventricle: Systolic pressure was increased. - Right atrium: The atrium was mildly dilated. - Tricuspid valve: Moderate regurgitation. - Pulmonary arteries: PA peak pressure: 49mm Hg (S). Impressions:  - Atrial paced rhythm with intact A-V conduction. The right ventricular systolic pressure was increased consistent with moderate pulmonary hypertension.   Lexiscan nuclear stress test: 01/04/2014 FINDINGS: Quality of the study is satisfactory. Patient tolerated lexiscan without symptoms. No chest pain. EKG shows changes of paced rhythm.  Perfusion images show a medium size defect of moderate severity involving the midinferolateral and basal inferolateral segments. There is partial reversibility.  The EDV is 135 ml. The ESV is 80 ml. The EF is 41%. There is mild inferior wall hypokinesis.  IMPRESSION: Abnormal lexiscan myoview consistent with inferolateral scar with partial reversibility. There is LV systolic dysfunction with inferior wall motion abnormality.  Cassell Clement M.D. On: 01/04/2014 15:11  Cath 01/16/2014 Angiographic Findings:  Left main: Distal 30% stenosis.  Left Anterior Descending Artery: Large caliber vessel. 70% proximal stenosis followed by 100% mid occlusion. The mid and distal vessel fills from the patent IMA graft. The Diagonal branch fills from the patent vein graft.  Circumflex Artery: Moderate caliber vessel with mid occlusion. The first OM branch is patent. The second OM branch fills from the patent vein graft.  Right Coronary Artery: Large caliber dominant vessel. The proximal vessel has 100% occlusion. The distal vessel fills from left to right collaterals. The vein graft to the distal vessel is known to be occluded.  Graft Anatomy:  SVG to RCA is occluded  SVG to OM is patent with 40% ostial stenosis, diffuse  aneurysmal enlargement of entire vein graft with 40% mid body stenosis and 99% hazy mid body stenosis.  SVG to  Diagonal is patent but the entire graft is aneurysmal with diffuse disease, TIMI-1 flow. Not a target for PCI  LIMA to LAD is patent.  Left Ventricular Angiogram: Deferred.  Impression:  1. Severe triple vessel CAD with 3/4 patent grafts.  2. Recent NSTEMI and lateral wall ischemia on stress test secondary to severe disease in body of SVG to OM  3. Successful PTCA/bare metal stent x 1 mid body of SVG to OM  Recommendations: He will need ASA and Plavix for at least one month but longer if he tolerates well. Continue other cardiac meds. D/C home tomorrow if stable with f/u with Dr. Delton SeeNelson in 2-3 weeks.  Complications: None. The patient tolerated the procedure well.     ASSESSMENT AND PLAN   80 year old male   1. NSTEMI on 12/30/2013 - in the settings of acute URI, maximum troponin 5, Positive Lexiscan nuclear stress test with mid and basal inferolateral scar with periinfarct ischemia, moderate LV dysfunction 40%, cardiac cath on 01/17/14 showed severe disease in body of SVG to OM, s/p successful PTCA/bare metal stent x 1 mid body of SVG to OM. ASA and Plavix for at least one month but longer if he tolerates well. Continue metoprolol, atorvastatin.   2. Acute on chronic systolic CHF - LVEF 35%, lower extremity edema after stopping the Lasix. We restarted 20 mg daily at the last visit. We will check CMP today  3.Chronic CKD - baseline Crea 1.5, 1.3 in February. Check today.  4. S/P PPM for symptomatic bradycardia - followed by Dr Johney FrameAllred, EKG today shows atrial paced rhythm with prolonged AV conduction with PR interval 324 ms. This is concerning with symptoms of dizziness and recent falls. We will send him to the device clinic for reprogramming.  5. Hypertension - uncontrolled we will divide his medicine so that he takes metoprolol in the morning and/or and amlodipine at night.  6.  Orthostatic hypotension - resolved after decreasing the dose of amlodipine. Further decreased to 2.5 mg daily as we are adding Lasix.   Follow up in 4 months with BMP.  Lars MassonELSON, Kimberlly Norgard H 07/16/2014

## 2014-07-17 ENCOUNTER — Telehealth: Payer: Self-pay | Admitting: *Deleted

## 2014-07-17 MED ORDER — POTASSIUM CHLORIDE ER 10 MEQ PO TBCR: 10.0000 meq | EXTENDED_RELEASE_TABLET | Freq: Every day | ORAL | Status: DC

## 2014-07-17 NOTE — Telephone Encounter (Signed)
Contacted pt to make him aware of his lab results and that his K level was slightly low per Dr Delton SeeNelson.  Informed the pt that Dr Delton SeeNelson wants to add 10 mEq of KCL po daily.  Confirmed pharmacy of choice with the pt and provided him instruction on how to take this med.  Pt verbalized understanding and agrees with this plan.

## 2014-07-17 NOTE — Telephone Encounter (Signed)
Message copied by Loa SocksMARTIN, IVY M on Wed Jul 17, 2014  9:39 AM ------      Message from: Lars MassonNELSON, KATARINA H      Created: Tue Jul 16, 2014  5:11 PM       Please call him to add 10 mEq of KCL daily. Thank you,K ------

## 2014-11-14 ENCOUNTER — Encounter (HOSPITAL_COMMUNITY): Payer: Self-pay | Admitting: Surgery

## 2014-11-14 ENCOUNTER — Ambulatory Visit: Payer: Medicare Other | Admitting: Cardiology

## 2014-11-17 ENCOUNTER — Emergency Department (HOSPITAL_COMMUNITY): Payer: Medicare Other

## 2014-11-17 ENCOUNTER — Inpatient Hospital Stay (HOSPITAL_COMMUNITY): Payer: Medicare Other

## 2014-11-17 ENCOUNTER — Inpatient Hospital Stay (HOSPITAL_COMMUNITY)
Admission: EM | Admit: 2014-11-17 | Discharge: 2014-11-19 | DRG: 064 | Disposition: A | Discharge status: Home / Self Care | Payer: Medicare Other | Attending: Neurology | Admitting: Neurology

## 2014-11-17 ENCOUNTER — Encounter (HOSPITAL_COMMUNITY): Payer: Self-pay | Admitting: *Deleted

## 2014-11-17 DIAGNOSIS — D696 Thrombocytopenia, unspecified: Secondary | ICD-10-CM | POA: Diagnosis present

## 2014-11-17 DIAGNOSIS — I5043 Acute on chronic combined systolic (congestive) and diastolic (congestive) heart failure: Secondary | ICD-10-CM | POA: Diagnosis present

## 2014-11-17 DIAGNOSIS — Z955 Presence of coronary angioplasty implant and graft: Secondary | ICD-10-CM | POA: Diagnosis not present

## 2014-11-17 DIAGNOSIS — E785 Hyperlipidemia, unspecified: Secondary | ICD-10-CM | POA: Diagnosis present

## 2014-11-17 DIAGNOSIS — I10 Essential (primary) hypertension: Secondary | ICD-10-CM | POA: Diagnosis present

## 2014-11-17 DIAGNOSIS — J449 Chronic obstructive pulmonary disease, unspecified: Secondary | ICD-10-CM | POA: Diagnosis present

## 2014-11-17 DIAGNOSIS — Z888 Allergy status to other drugs, medicaments and biological substances status: Secondary | ICD-10-CM | POA: Diagnosis not present

## 2014-11-17 DIAGNOSIS — Z9841 Cataract extraction status, right eye: Secondary | ICD-10-CM

## 2014-11-17 DIAGNOSIS — I739 Peripheral vascular disease, unspecified: Secondary | ICD-10-CM | POA: Diagnosis present

## 2014-11-17 DIAGNOSIS — Z8701 Personal history of pneumonia (recurrent): Secondary | ICD-10-CM | POA: Diagnosis not present

## 2014-11-17 DIAGNOSIS — Z720 Tobacco use: Secondary | ICD-10-CM | POA: Diagnosis present

## 2014-11-17 DIAGNOSIS — I2510 Atherosclerotic heart disease of native coronary artery without angina pectoris: Secondary | ICD-10-CM | POA: Diagnosis present

## 2014-11-17 DIAGNOSIS — J90 Pleural effusion, not elsewhere classified: Secondary | ICD-10-CM

## 2014-11-17 DIAGNOSIS — Z7902 Long term (current) use of antithrombotics/antiplatelets: Secondary | ICD-10-CM | POA: Diagnosis not present

## 2014-11-17 DIAGNOSIS — Z8673 Personal history of transient ischemic attack (TIA), and cerebral infarction without residual deficits: Secondary | ICD-10-CM

## 2014-11-17 DIAGNOSIS — Z9842 Cataract extraction status, left eye: Secondary | ICD-10-CM | POA: Diagnosis not present

## 2014-11-17 DIAGNOSIS — Z950 Presence of cardiac pacemaker: Secondary | ICD-10-CM | POA: Diagnosis not present

## 2014-11-17 DIAGNOSIS — I25810 Atherosclerosis of coronary artery bypass graft(s) without angina pectoris: Secondary | ICD-10-CM | POA: Diagnosis present

## 2014-11-17 DIAGNOSIS — R200 Anesthesia of skin: Secondary | ICD-10-CM | POA: Diagnosis not present

## 2014-11-17 DIAGNOSIS — I639 Cerebral infarction, unspecified: Secondary | ICD-10-CM

## 2014-11-17 DIAGNOSIS — I611 Nontraumatic intracerebral hemorrhage in hemisphere, cortical: Secondary | ICD-10-CM

## 2014-11-17 DIAGNOSIS — F17210 Nicotine dependence, cigarettes, uncomplicated: Secondary | ICD-10-CM | POA: Diagnosis present

## 2014-11-17 DIAGNOSIS — Z0000 Encounter for general adult medical examination without abnormal findings: Secondary | ICD-10-CM

## 2014-11-17 DIAGNOSIS — I610 Nontraumatic intracerebral hemorrhage in hemisphere, subcortical: Principal | ICD-10-CM | POA: Diagnosis present

## 2014-11-17 DIAGNOSIS — R531 Weakness: Secondary | ICD-10-CM | POA: Insufficient documentation

## 2014-11-17 DIAGNOSIS — E039 Hypothyroidism, unspecified: Secondary | ICD-10-CM | POA: Diagnosis present

## 2014-11-17 DIAGNOSIS — I252 Old myocardial infarction: Secondary | ICD-10-CM | POA: Diagnosis not present

## 2014-11-17 DIAGNOSIS — I619 Nontraumatic intracerebral hemorrhage, unspecified: Secondary | ICD-10-CM | POA: Diagnosis present

## 2014-11-17 LAB — BASIC METABOLIC PANEL
Anion gap: 10 (ref 5–15)
BUN: 15 mg/dL (ref 6–23)
CO2: 27 mEq/L (ref 19–32)
Calcium: 8.5 mg/dL (ref 8.4–10.5)
Chloride: 105 mEq/L (ref 96–112)
Creatinine, Ser: 1.12 mg/dL (ref 0.50–1.35)
GFR calc Af Amer: 67 mL/min — ABNORMAL LOW (ref >90)
GFR calc non Af Amer: 58 mL/min — ABNORMAL LOW (ref >90)
Glucose, Bld: 125 mg/dL — ABNORMAL HIGH (ref 70–99)
Potassium: 3.5 mEq/L — ABNORMAL LOW (ref 3.7–5.3)
Sodium: 142 mEq/L (ref 137–147)

## 2014-11-17 LAB — CBC WITH DIFFERENTIAL/PLATELET
Basophils Absolute: 0.1 10*3/uL (ref 0.0–0.1)
Basophils Relative: 1 % (ref 0–1)
Eosinophils Absolute: 0.1 10*3/uL (ref 0.0–0.7)
Eosinophils Relative: 1 % (ref 0–5)
HCT: 42.4 % (ref 39.0–52.0)
Hemoglobin: 14.5 g/dL (ref 13.0–17.0)
Lymphocytes Relative: 35 % (ref 12–46)
Lymphs Abs: 2.5 10*3/uL (ref 0.7–4.0)
MCH: 32.2 pg (ref 26.0–34.0)
MCHC: 34.2 g/dL (ref 30.0–36.0)
MCV: 94.2 fL (ref 78.0–100.0)
Monocytes Absolute: 0.4 10*3/uL (ref 0.1–1.0)
Monocytes Relative: 6 % (ref 3–12)
Neutro Abs: 4.3 10*3/uL (ref 1.7–7.7)
Neutrophils Relative %: 57 % (ref 43–77)
Platelets: 136 10*3/uL — ABNORMAL LOW (ref 150–400)
RBC: 4.5 MIL/uL (ref 4.22–5.81)
RDW: 14.7 % (ref 11.5–15.5)
WBC: 7.3 10*3/uL (ref 4.0–10.5)

## 2014-11-17 LAB — MRSA PCR SCREENING: MRSA BY PCR: NEGATIVE

## 2014-11-17 MED ORDER — NICARDIPINE HCL IN NACL 20-0.86 MG/200ML-% IV SOLN
3.0000 mg/h | INTRAVENOUS | Status: DC
  Administered 2014-11-18: 2 mg/h via INTRAVENOUS
  Filled 2014-11-17: qty 200

## 2014-11-17 MED ORDER — LABETALOL HCL 5 MG/ML IV SOLN
15.0000 mg | Freq: Once | INTRAVENOUS | Status: AC
Start: 2014-11-17 — End: 2014-11-17
  Administered 2014-11-17: 15 mg via INTRAVENOUS
  Filled 2014-11-17: qty 4

## 2014-11-17 MED ORDER — LABETALOL HCL 5 MG/ML IV SOLN
10.0000 mg | INTRAVENOUS | Status: DC | PRN
  Administered 2014-11-17 – 2014-11-18 (×5): 20 mg via INTRAVENOUS
  Filled 2014-11-17: qty 4
  Filled 2014-11-17: qty 8
  Filled 2014-11-17 (×2): qty 4

## 2014-11-17 MED ORDER — ACETAMINOPHEN 650 MG RE SUPP: 650.0000 mg | RECTAL | Status: DC | PRN

## 2014-11-17 MED ORDER — NICARDIPINE HCL IN NACL 20-0.86 MG/200ML-% IV SOLN
INTRAVENOUS | Status: AC
  Administered 2014-11-17: 2 mg
  Filled 2014-11-17: qty 200

## 2014-11-17 MED ORDER — LABETALOL HCL 5 MG/ML IV SOLN
10.0000 mg | Freq: Once | INTRAVENOUS | Status: AC
  Administered 2014-11-17: 10 mg via INTRAVENOUS
  Filled 2014-11-17: qty 4

## 2014-11-17 MED ORDER — ACETAMINOPHEN 325 MG PO TABS: 650.0000 mg | ORAL_TABLET | ORAL | Status: DC | PRN

## 2014-11-17 MED ORDER — SENNOSIDES-DOCUSATE SODIUM 8.6-50 MG PO TABS
1.0000 | ORAL_TABLET | Freq: Two times a day (BID) | ORAL | Status: DC
  Administered 2014-11-17 – 2014-11-19 (×4): 1 via ORAL
  Filled 2014-11-17 (×5): qty 1

## 2014-11-17 MED ORDER — SODIUM CHLORIDE 0.9 % IV SOLN
INTRAVENOUS | Status: DC
Start: 2014-11-17 — End: 2014-11-19
  Administered 2014-11-17: 75 mL/h via INTRAVENOUS
  Administered 2014-11-18: 1000 mL via INTRAVENOUS

## 2014-11-17 MED ORDER — STROKE: EARLY STAGES OF RECOVERY BOOK
Freq: Once | Status: AC
  Administered 2014-11-17: 1
  Filled 2014-11-17: qty 1

## 2014-11-17 MED ORDER — NICARDIPINE HCL IN NACL 20-0.86 MG/200ML-% IV SOLN: 3.0000 mg/h | INTRAVENOUS | Status: DC

## 2014-11-17 MED ORDER — PANTOPRAZOLE SODIUM 40 MG IV SOLR
40.0000 mg | Freq: Every day | INTRAVENOUS | Status: DC
  Administered 2014-11-17: 40 mg via INTRAVENOUS
  Filled 2014-11-17 (×2): qty 40

## 2014-11-17 NOTE — ED Notes (Signed)
Per Dr. Juleen ChinaKohut, pt okay to eat. Pt eating food. Tolerating it well.

## 2014-11-17 NOTE — ED Notes (Signed)
Pt returned from CT °

## 2014-11-17 NOTE — Progress Notes (Signed)
eLink Physician-Brief Progress Note Patient Name: Louis HimRaymond [name] DOB: 1927-05-16 MRN: [id number]   Date of Service  11/17/2014  HPI/Events of Note  80 yr old male on ASA and plavix as outpatient presented with neurologic complaints and found to have a small ICH.  Patient stable from hemodynamic and respiratory standpoint.  Patient admitted by neurology.    eICU Interventions  Orders reviewed No recommendations at this time     Intervention Category Evaluation Type: New Patient Evaluation  Henry RusselSMITH, [name], P 11/17/2014, 6:04 PM

## 2014-11-17 NOTE — ED Notes (Signed)
Pt reports waking up this am with numbness to right hand and pain/numbness to entire right leg.

## 2014-11-17 NOTE — H&P (Signed)
Admission H&P    Chief Complaint: Numbness involving right arm and leg as well as right lower extremity pain.  HPI: [name] is an 80 y.o. male history of hypertension, coronary artery disease, TIA, hyperlipidemia, COPD, stroke and AAA, presenting with new onset numbness involving right arm and leg as well as continued pain involving his right leg. He's had pain involving the leg off and on for the past 3 weeks which seems to be related to activity. Numbness is of new onset and occurred overnight. He was last known well at bedtime at 9 PM tonight. CT scan of his head showed a small intraparenchymal hemorrhage involving the left centrum semi-ovale. NIH stroke score was 2. He has been taking aspirin and Plavix daily. Patient also has a cardiac pacemaker.  LSN: 9 PM on 11/16/2014 tPA Given: No: Beyond time window for treatment consideration mRankin:  Past Medical History  Diagnosis Date  . Hypertension   . CAD (coronary artery disease)     a. s/p MI in 1988;  b. s/p CABG in 2000 (Dr. Ricard Dillon) x 4, LIMA to LAD, SVG to diagonal, SVG to [facility], SVG to PDA;  c. 12/2013 NSTEMI/abnl MV;  d. 01/2014 Cath/PCI: native 3VD, VG->RCA 100, VG->OM aneurysmal 40ost/m, 66m5.0x12 Veriflex BMS), VG->Diag aneurysmal, LIMA->LAD nl.  . TIA (transient ischemic attack)   . Hyperlipidemia   . COPD (chronic obstructive pulmonary disease)   . CVA (cerebral infarction) 07/2011    Remote left brain infarct with sensory deficits and had a left internal carotid stent placed by Dr. DEstanislado Pandyat that time, no recurrent CVA or TIAs or CNS events.  .Marland KitchenPAD (peripheral artery disease)     Stable. Had a past RFPBG by Dr. EDonnetta Hutchingin 2009. this graft is occluded with reconstitution in the popliteal artery, which was patent. Right tibial was occluded and 2-vessel runoff on angiography done by Dr. BTrula Sladein 12/2010.  . Sick sinus syndrome     PPM implanted for this and syncope   . Chronic systolic CHF (congestive heart failure)    Hospitalization for CHF from 05/04/13-05/06/13. Treated medically. Had possible pneumonia with short course of antibiotics.  . Abdominal bruit     Loud  . AAA (abdominal aortic aneurysm)     Has a known AAA of 3.2 x 3.4 cm by abdominal untrasound 03/27/12. Followup abdominal ultrasound on 03/29/13 showed a diameter of 3.6 cm, which is stable  . Peripheral vascular disease   . Hypothyroid     On supplement "don't know if I take RX or not for my thyroid" (01/16/2014)  . Stroke     Past Surgical History  Procedure Laterality Date  . Femoral bypass Right ?2001  . Carotid stent Left 06/23/11    By Dr. BTrula Slade . Coronary artery bypass graft  2000    1st in 2000 (Dr. ORicard Dillon x 4, LIMA to LAD, SVG to diagonal, SVG to OM4, SVG to PDA. Re-cath in May 2012 with patent LIMA to LAD, patent SVG to diagonal, patent SVG to OM with left-to-right collaterals to an occluded right.  . Coronary angioplasty with stent placement  2001-01/16/2014    "think today makes #4" (01/16/2014)  . Cardiac catheterization  2000  . Inguinal hernia repair Right 1980's  . Insert / replace / remove pacemaker      implanted 2003 by Dr MTami Ribaswith gen change (MDT [id number]) 08/2010 by [facility] . Cataract extraction w/ intraocular lens  implant, bilateral Bilateral   . Abdominal aortagram N/A  02/22/2012    Procedure: ABDOMINAL Maxcine Ham;  Surgeon: Serafina Mitchell, MD;  Location: [facility] CATH LAB;  Service: Cardiovascular;  Laterality: N/A;  . Carotid angiogram Bilateral 02/22/2012    Procedure: CAROTID ANGIOGRAM;  Surgeon: Serafina Mitchell, MD;  Location: [facility] CATH LAB;  Service: Cardiovascular;  Laterality: Bilateral;  . Left heart catheterization with coronary/graft angiogram N/A 01/16/2014    Procedure: LEFT HEART CATHETERIZATION WITH Beatrix Fetters;  Surgeon: Burnell Blanks, MD;  Location: [facility] CATH LAB;  Service: Cardiovascular;  Laterality: N/A;    Family History  Problem Relation Age of Onset  . Cancer Brother 60    oral   .  Prostate cancer Brother   . Hypertension Brother   . Hyperlipidemia Brother   . Lung disease Father     black lung disease   Social History:  reports that he has been smoking Cigarettes.  He has a 34 pack-year smoking history. He quit smokeless tobacco use about 3 years ago. He reports that he does not drink alcohol or use illicit drugs.  Allergies:  Allergies  Allergen Reactions  . Pravachol     myalgias  . Simvastatin     myalgia  . Terazosin    Medications: Patient's list of medications prior to admission was personally reviewed by me.  ROS: History obtained from the patient  General ROS: negative for - chills, fatigue, fever, night sweats, weight gain or weight loss Psychological ROS: negative for - behavioral disorder, hallucinations, memory difficulties, mood swings or suicidal ideation Ophthalmic ROS: negative for - blurry vision, double vision, eye pain or loss of vision ENT ROS: negative for - epistaxis, nasal discharge, oral lesions, sore throat, tinnitus or vertigo Allergy and Immunology ROS: negative for - hives or itchy/watery eyes Hematological and Lymphatic ROS: negative for - bleeding problems, bruising or swollen lymph nodes Endocrine ROS: negative for - galactorrhea, hair pattern changes, polydipsia/polyuria or temperature intolerance Respiratory ROS: negative for - cough, hemoptysis, shortness of breath or wheezing Cardiovascular ROS: negative for - chest pain, dyspnea on exertion, edema or irregular heartbeat Gastrointestinal ROS: negative for - abdominal pain, diarrhea, hematemesis, nausea/vomiting or stool incontinence Genito-Urinary ROS: negative for - dysuria, hematuria, incontinence or urinary frequency/urgency Musculoskeletal ROS: Leg pain as noted in present illness Neurological ROS: as noted in HPI Dermatological ROS: negative for rash and skin lesion changes  Physical Examination: Blood pressure 175/90, pulse 78, temperature 97.6 F (36.4 C),  temperature source Oral, resp. rate 23, height 5' 9"  (1.753 m), weight 64.864 kg (143 lb), SpO2 96 %.  Appearance was that of a slender elderly man who was alert and in no acute distress. Mental status was normal. HEENT-  Normocephalic, no lesions, without obvious abnormality.  Normal external eye and conjunctiva.  Normal TM's bilaterally.  Normal auditory canals and external ears. Normal external nose, mucus membranes and septum.  Normal pharynx. Neck supple with no masses, nodes, nodules or enlargement. Cardiovascular - regular rate and rhythm, S1, S2 normal, no murmur, click, rub or gallop Lungs - chest clear, no wheezing, rales, normal symmetric air entry Abdomen - soft, non-tender; bowel sounds normal; no masses,  no organomegaly Extremities - no joint deformities, effusion, or inflammation, no edema and no skin discoloration  Neurologic Examination: Mental Status: Alert, oriented, thought content appropriate.  Speech fluent without evidence of aphasia. Able to follow commands without difficulty. Cranial Nerves: II-Visual fields were normal. III/IV/VI-Pupils were equal and reacted. Extraocular movements were full and conjugate.    V/VII-no facial numbness and  no facial weakness. VIII-normal. X-normal speech and symmetrical palatal movement. Motor: Slight drift of right upper extremity to: Normal motor exam otherwise. Sensory: Reduced perception of temperature and light touch over right extremities compared to left extremities. Deep Tendon Reflexes: 1+ and symmetric. Plantars: Flexor bilaterally Cerebellar: Normal finger-to-nose testing. Carotid auscultation: Soft left carotid bruit  Results for orders placed or performed during the hospital encounter of 11/17/14 (from the past 48 hour(s))  CBC with Differential     Status: Abnormal   Collection Time: 11/17/14 12:52 PM  Result Value Ref Range   WBC 7.3 4.0 - 10.5 K/uL   RBC 4.50 4.22 - 5.81 MIL/uL   Hemoglobin 14.5 13.0 - 17.0  g/dL   HCT 42.4 39.0 - 52.0 %   MCV 94.2 78.0 - 100.0 fL   MCH 32.2 26.0 - 34.0 pg   MCHC 34.2 30.0 - 36.0 g/dL   RDW 14.7 11.5 - 15.5 %   Platelets 136 (L) 150 - 400 K/uL   Neutrophils Relative % 57 43 - 77 %   Neutro Abs 4.3 1.7 - 7.7 K/uL   Lymphocytes Relative 35 12 - 46 %   Lymphs Abs 2.5 0.7 - 4.0 K/uL   Monocytes Relative 6 3 - 12 %   Monocytes Absolute 0.4 0.1 - 1.0 K/uL   Eosinophils Relative 1 0 - 5 %   Eosinophils Absolute 0.1 0.0 - 0.7 K/uL   Basophils Relative 1 0 - 1 %   Basophils Absolute 0.1 0.0 - 0.1 K/uL  Basic metabolic panel     Status: Abnormal   Collection Time: 11/17/14 12:52 PM  Result Value Ref Range   Sodium 142 137 - 147 mEq/L   Potassium 3.5 (L) 3.7 - 5.3 mEq/L   Chloride 105 96 - 112 mEq/L   CO2 27 19 - 32 mEq/L   Glucose, Bld 125 (H) 70 - 99 mg/dL   BUN 15 6 - 23 mg/dL   Creatinine, Ser 1.12 0.50 - 1.35 mg/dL   Calcium 8.5 8.4 - 10.5 mg/dL   GFR calc non Af Amer 58 (L) >90 mL/min   GFR calc Af Amer 67 (L) >90 mL/min    Comment: (NOTE) The eGFR has been calculated using the CKD EPI equation. This calculation has not been validated in all clinical situations. eGFR's persistently <90 mL/min signify possible Chronic Kidney Disease.    Anion gap 10 5 - 15   Ct Head Wo Contrast  11/17/2014   CLINICAL DATA:  Right-sided weakness. No history of stroke. History of hypertension.  EXAM: CT HEAD WITHOUT CONTRAST  TECHNIQUE: Contiguous axial images were obtained from the base of the skull through the vertex without intravenous contrast.  COMPARISON:  12/30/2013  FINDINGS: There is moderate central and cortical atrophy. Periventricular white matter changes are consistent with small vessel disease.  Within the left centrum semiovale there is a small rounded focus of hemorrhage measuring 9.5 mm. There is little if any surrounding edema or mass effect. No acute infarct. No intracranial mass.  Bone windows are unremarkable. There is moderate atherosclerotic  calcification of the internal carotid arteries.  IMPRESSION: 1. Small focus of intraparenchymal hemorrhage in the left centrum semiovale measuring 9.5 mm. 2. Little if any mass effect or surrounding edema. 3. Moderate atrophy and small vessel disease. 4. Critical Value/emergent results were called by telephone at the time of interpretation on 11/17/2014 at 1:33 pm to Dr. Virgel Manifold , who verbally acknowledged these results.   Electronically Signed  By: Shon Hale M.D.   On: 11/17/2014 13:35    Assessment: 80 y.o. male with multiple risk factors for stroke presenting with acute small syndrome semiovale intraparenchymal hemorrhage, most likely related to hypertension.  Stroke Risk Factors - family history, hyperlipidemia, hypertension and smoking  Plan: 1. HgbA1c, fasting lipid panel 2. CT angiogram of head and neck with contrast 3. PT consult, OT consult, Speech consult 4. Echocardiogram 5. Prophylactic therapy-None 6. Risk factor modification 7. Telemetry monitoring   C.R. Nicole Kindred, MD [facility] [phone]  11/17/2014, 4:11 PM

## 2014-11-17 NOTE — ED Notes (Signed)
Pt states its sharper on left arm and is unable to feel pinprick on right leg.

## 2014-11-17 NOTE — ED Provider Notes (Signed)
CSN: [id number]     Arrival date & time 11/17/14  1115 History   First MD Initiated Contact with Patient 11/17/14 1131     Chief Complaint  Patient presents with  . Numbness     (Consider location/radiation/quality/duration/timing/severity/associated sxs/prior Treatment) HPI  87yM with past hx of hypertension, coronary artery disease, TIA, hyperlipidemia, COPD, stroke and AAA, presenting with new onset numbness involving right arm and leg as well as continued pain involving his right leg. He's had pain involving the leg off and on for the past 3 weeks which seems to be related to activity. Numbness is of new onset and occurred overnight. He was last known well at bedtime at 9 PM tonight 11/16/2014.Numbness has been fairly constant since first noticed this morning. No HA. No visual complaints. No fever or chills. On ASA and plavix.   Past Medical History  Diagnosis Date  . Hypertension   . CAD (coronary artery disease)     a. s/p MI in 1988;  b. s/p CABG in 2000 ([name]) x 4, LIMA to LAD, SVG to diagonal, [facility], SVG to PDA;  c. 12/2013 NSTEMI/abnl MV;  d. 01/2014 Cath/PCI: native 3VD, VG->RCA 100, VG->OM aneurysmal 40ost/m, 49m(5.0x12 Veriflex BMS), VG->Diag aneurysmal, LIMA->LAD nl.  . TIA (transient ischemic attack)   . Hyperlipidemia   . COPD (chronic obstructive pulmonary disease)   . CVA (cerebral infarction) 07/2011    Remote left brain infarct with sensory deficits and had a left internal carotid stent placed by Dr. Corliss Skainseveshwar at that time, no recurrent CVA or TIAs or CNS events.  Marland Kitchen. PAD (peripheral artery disease)     Stable. Had a past RFPBG by Dr. Arbie CookeyEarly in 2009. this graft is occluded with reconstitution in the popliteal artery, which was patent. Right tibial was occluded and 2-vessel runoff on angiography done by Dr. Myra GianottiBrabham in 12/2010.  . Sick sinus syndrome     PPM implanted for this and syncope   . Chronic systolic CHF (congestive heart failure)     Hospitalization for  CHF from 05/04/13-05/06/13. Treated medically. Had possible pneumonia with short course of antibiotics.  . Abdominal bruit     Loud  . AAA (abdominal aortic aneurysm)     Has a known AAA of 3.2 x 3.4 cm by abdominal untrasound 03/27/12. Followup abdominal ultrasound on 03/29/13 showed a diameter of 3.6 cm, which is stable  . Peripheral vascular disease   . Hypothyroid     On supplement "don't know if I take RX or not for my thyroid" (01/16/2014)  . Stroke    Past Surgical History  Procedure Laterality Date  . Femoral bypass Right ?2001  . Carotid stent Left 06/23/11    By Dr. Myra GianottiBrabham  . Coronary artery bypass graft  2000    1st in 2000 ([name]) x 4, LIMA to LAD, SVG to diagonal, SVG to OM4, SVG to PDA. Re-cath in May 2012 with patent LIMA to LAD, patent SVG to diagonal, patent SVG to OM with left-to-right collaterals to an occluded right.  . Coronary angioplasty with stent placement  2001-01/16/2014    "think today makes #4" (01/16/2014)  . Cardiac catheterization  2000  . Inguinal hernia repair Right 1980's  . Insert / replace / remove pacemaker      implanted 2003 by Dr [name] with gen change (MDT [id number]) 08/2010 by [facility]  . Cataract extraction w/ intraocular lens  implant, bilateral Bilateral   . Abdominal aortagram N/A 02/22/2012  Procedure: ABDOMINAL AORTAGRAM;  Surgeon: Nada LibmanVance W Brabham, MD;  [facility];  Service: Cardiovascular;  Laterality: N/A;  . Carotid angiogram Bilateral 02/22/2012    Procedure: CAROTID ANGIOGRAM;  Surgeon: Nada LibmanVance W Brabham, MD;  [facility];  Service: Cardiovascular;  Laterality: Bilateral;  . Left heart catheterization with coronary/graft angiogram N/A 01/16/2014    Procedure: LEFT HEART CATHETERIZATION WITH Isabel CapriceORONARY/GRAFT ANGIOGRAM;  Surgeon: Kathleene Hazelhristopher D McAlhany, MD;  [facility];  Service: Cardiovascular;  Laterality: N/A;   Family History  Problem Relation Age of Onset  . Cancer Brother 60    oral   . Prostate cancer Brother    . Hypertension Brother   . Hyperlipidemia Brother   . Lung disease Father     black lung disease   History  Substance Use Topics  . Smoking status: Current Every Day Smoker -- 0.50 packs/day for 68 years    Types: Cigarettes  . Smokeless tobacco: Former NeurosurgeonUser    Quit date: 06/02/2011  . Alcohol Use: No    Review of Systems  All systems reviewed and negative, other than as noted in HPI.   Allergies  Pravachol; Simvastatin; and Terazosin  Home Medications   Prior to Admission medications   Medication Sig Start Date End Date Taking? Authorizing Provider  amLODipine (NORVASC) 10 MG tablet Take 0.5 tablets (5 mg total) by mouth daily after supper. 07/16/14   Lars MassonKatarina H Nelson, MD  aspirin 81 MG EC tablet TAKE 1 TABLET (81 MG TOTAL) BY MOUTH DAILY. 02/26/14   Lars MassonKatarina H Nelson, MD  atorvastatin (LIPITOR) 40 MG tablet Take 20 mg by mouth at bedtime.    Historical Provider, MD  clopidogrel (PLAVIX) 75 MG tablet Take 1 tablet (75 mg total) by mouth daily with breakfast. 01/17/14   Ok Anishristopher R Berge, NP  feeding supplement, ENSURE COMPLETE, (ENSURE COMPLETE) LIQD Take 237 mLs by mouth 2 (two) times daily between meals. 01/05/14   Renae FickleMackenzie Short, MD  furosemide (LASIX) 20 MG tablet Take 1 tablet (20 mg total) by mouth daily. 04/15/14   Lars MassonKatarina H Nelson, MD  gabapentin (NEURONTIN) 300 MG capsule Take 300 mg by mouth 2 (two) times daily.    Historical Provider, MD  isosorbide mononitrate (IMDUR) 60 MG 24 hr tablet Take 1 tablet (60 mg total) by mouth at bedtime. 07/16/14   Lars MassonKatarina H Nelson, MD  metoprolol (TOPROL-XL) 200 MG 24 hr tablet Take 0.5 tablets (100 mg total) by mouth every morning. 07/16/14   Lars MassonKatarina H Nelson, MD  Multiple Vitamins-Minerals (MULTIVITAMIN WITH MINERALS) tablet Take 1 tablet by mouth daily.    Historical Provider, MD  nitroGLYCERIN (NITROSTAT) 0.4 MG SL tablet Place 1 tablet (0.4 mg total) under the tongue every 5 (five) minutes as needed. For chest pain 06/11/14    Hollice EspySendil K Krishnan, MD  potassium chloride (K-DUR) 10 MEQ tablet Take 1 tablet (10 mEq total) by mouth daily. 07/17/14   Lars MassonKatarina H Nelson, MD  sertraline (ZOLOFT) 100 MG tablet Take 50 mg by mouth at bedtime.     Historical Provider, MD  Tamsulosin HCl (FLOMAX) 0.4 MG CAPS Take 0.4 mg by mouth daily after supper.     Historical Provider, MD   BP 180/81 mmHg  Pulse 72  Temp(Src) 97.4 F (36.3 C) (Oral)  Resp 18  Ht 5\' 9"  (1.753 m)  Wt 143 lb (64.864 kg)  BMI 21.11 kg/m2  SpO2 100% Physical Exam  Constitutional: He is oriented to person, place, and time. He appears  well-developed and well-nourished. No distress.  HENT:  Head: Normocephalic and atraumatic.  Eyes: Conjunctivae are normal. Right eye exhibits no discharge. Left eye exhibits no discharge.  Neck: Neck supple.  Cardiovascular: Normal rate, regular rhythm and normal heart sounds.  Exam reveals no gallop and no friction rub.   No murmur heard. Pulmonary/Chest: Effort normal and breath sounds normal. No respiratory distress.  Abdominal: Soft. He exhibits no distension. There is no tenderness.  Musculoskeletal: He exhibits no edema or tenderness.  Neurological: He is alert and oriented to person, place, and time. He has normal reflexes. No cranial nerve deficit. Coordination normal.  Decreased sensation to light touch RUE. Strength 4/5 R hip flex/ext. No focal deficits otherwise.   Skin: Skin is warm and dry.  Psychiatric: He has a normal mood and affect. His behavior is normal. Thought content normal.  Nursing note and vitals reviewed.   ED Course  Procedures (including critical care time)  CRITICAL CARE Performed by: Raeford Razor   Total critical care time: 35 minutes  Critical care time was exclusive of separately billable procedures and treating other patients. Critical care was necessary to treat or prevent imminent or life-threatening deterioration. Critical care was time spent personally by me on the following  activities: development of treatment plan with patient and/or surrogate as well as nursing, discussions with consultants, evaluation of patient's response to treatment, examination of patient, obtaining history from patient or surrogate, ordering and performing treatments and interventions, ordering and review of laboratory studies, ordering and review of radiographic studies, pulse oximetry and re-evaluation of patient's condition.  Labs Review Labs Reviewed  CBC WITH DIFFERENTIAL - Abnormal; Notable for the following:    Platelets 136 (*)    All other components within normal limits  BASIC METABOLIC PANEL - Abnormal; Notable for the following:    Potassium 3.5 (*)    Glucose, Bld 125 (*)    GFR calc non Af Amer 58 (*)    GFR calc Af Amer 67 (*)    All other components within normal limits  HEPATIC FUNCTION PANEL - Abnormal; Notable for the following:    Albumin 2.6 (*)    All other components within normal limits  PRO B NATRIURETIC PEPTIDE - Abnormal; Notable for the following:    Pro B Natriuretic peptide (BNP) 4549.0 (*)    All other components within normal limits  MRSA PCR SCREENING  SEDIMENTATION RATE    Imaging Review Ct Angio Head W/cm &/or Wo Cm  11/18/2014   CLINICAL DATA:  Numbness to the right hand. Pain and numbness throughout the right lower extremity.  EXAM: CT ANGIOGRAPHY HEAD AND NECK  TECHNIQUE: Multidetector CT imaging of the head and neck was performed using the standard protocol during bolus administration of intravenous contrast. Multiplanar CT image reconstructions and MIPs were obtained to evaluate the vascular anatomy. Carotid stenosis measurements (when applicable) are obtained utilizing NASCET criteria, using the distal internal carotid diameter as the denominator.  CONTRAST:  50mL OMNIPAQUE IOHEXOL 350 MG/ML SOLN  COMPARISON:  CT without head 11/17/2014.  FINDINGS: CTA HEAD FINDINGS  A 10 mm high left parietal hyperdense lesion is again noted. No other focal  mass lesions are present. There is no pathologic enhancement. Note is made of mild surrounding edema. A branch vessel crosses pass the lesion. This does not appear to be a vascular lesion. Periventricular and subcortical white matter hypoattenuation is present bilaterally and addition.  Atherosclerotic calcifications are present within the cavernous internal carotid arteries bilaterally. There  is moderate narrowing of the right cavernous internal carotid artery just proximal to the anterior genu relative to the more distal vessels. The terminal ICA is normal bilaterally. The left A1 segment is dominant. There is moderate narrowing of the distal right M1 segment. The bifurcations are intact. There is moderate attenuation of distal MCA branch vessels bilaterally, worse on the right.  The left vertebral artery is slightly dominant to the right. The PICA origins are below the dural margin. Both posterior cerebral arteries originate from the basilar tip. Posterior communicating arteries are patent bilaterally.  No postcontrast enhancement is present. The dural sinuses are patent. Note is made of a a lipoma in the high left parietal scalp.  Review of the MIP images confirms the above findings.  CTA NECK FINDINGS  Marked atherosclerotic irregularity is present at the aortic arch. A 3 vessel arch configuration is present. Atherosclerotic changes are present without focal stenosis.  A high-grade stenosis is present at the origin of the right vertebral artery. Dense calcifications with a moderate left vertebral artery stenosis are present. The left vertebral artery is slightly dominant to the right throughout the neck without additional stenoses. Atherosclerotic calcifications are present at the dural margin on the right. The PICA origins are below the dural margin.  The right common carotid artery demonstrates scattered atherosclerotic changes without a significant stenosis relative to the more distal vessels. Additional  atherosclerotic changes are present in the proximal right internal carotid artery and at the bifurcation without a significant stenosis relative to the more distal vessel. There is focal irregularity of the distal right ICA just below the skullbase. There is no significant stenosis and any of these lesions relative to the more distal vessel.  The left common carotid artery demonstrates a 50% stenosis proximally. The lumen is patent through the carotid bifurcations stent. Minimal atherosclerotic changes are present beyond the stent without a significant stool cyst.  The thyroid is heterogeneous without a discrete lesion. Small moderate bilateral pleural effusions are noted. Emphysema is present. A 15 mm right peritracheal lymph node is evident.  Multilevel endplate degenerative changes are noted in the cervical spine from C3-4 through C7-T1. No focal lytic or blastic lesions are present.  Review of the MIP images confirms the above findings.  IMPRESSION: 1. Stable 10 mm hyperdense lesion in the high left parietal lobe with surrounding vasogenic edema likely accounts for the patient's symptoms. 2. Moderate stenosis in the distal right M1 segment. 3. Moderate stenosis of the cavernous right ICA just proximal to the anterior genu. 4. Advanced atherosclerotic changes at the aortic arch without focal stenosis or aneurysm. 5. Extensive atherosclerotic changes within the right common carotid artery, at the bifurcation, and in the distal right ICA without a significant stenosis. 6. 50% stenosis of the proximal left common carotid artery. 7. Patent left carotid bifurcations stent. 8. 15 mm right peritracheal lymph node. While this may be reactive, neoplasm is not excluded. 9. Bilateral pleural effusions. 10. Emphysema.   Electronically Signed   By: Gennette Pac M.D.   On: 11/18/2014 11:11   Ct Angio Neck W/cm &/or Wo/cm  11/18/2014   CLINICAL DATA:  Numbness to the right hand. Pain and numbness throughout the right  lower extremity.  EXAM: CT ANGIOGRAPHY HEAD AND NECK  TECHNIQUE: Multidetector CT imaging of the head and neck was performed using the standard protocol during bolus administration of intravenous contrast. Multiplanar CT image reconstructions and MIPs were obtained to evaluate the vascular anatomy. Carotid stenosis measurements (when  applicable) are obtained utilizing NASCET criteria, using the distal internal carotid diameter as the denominator.  CONTRAST:  50mL OMNIPAQUE IOHEXOL 350 MG/ML SOLN  COMPARISON:  CT without head 11/17/2014.  FINDINGS: CTA HEAD FINDINGS  A 10 mm high left parietal hyperdense lesion is again noted. No other focal mass lesions are present. There is no pathologic enhancement. Note is made of mild surrounding edema. A branch vessel crosses pass the lesion. This does not appear to be a vascular lesion. Periventricular and subcortical white matter hypoattenuation is present bilaterally and addition.  Atherosclerotic calcifications are present within the cavernous internal carotid arteries bilaterally. There is moderate narrowing of the right cavernous internal carotid artery just proximal to the anterior genu relative to the more distal vessels. The terminal ICA is normal bilaterally. The left A1 segment is dominant. There is moderate narrowing of the distal right M1 segment. The bifurcations are intact. There is moderate attenuation of distal MCA branch vessels bilaterally, worse on the right.  The left vertebral artery is slightly dominant to the right. The PICA origins are below the dural margin. Both posterior cerebral arteries originate from the basilar tip. Posterior communicating arteries are patent bilaterally.  No postcontrast enhancement is present. The dural sinuses are patent. Note is made of a a lipoma in the high left parietal scalp.  Review of the MIP images confirms the above findings.  CTA NECK FINDINGS  Marked atherosclerotic irregularity is present at the aortic arch. A 3  vessel arch configuration is present. Atherosclerotic changes are present without focal stenosis.  A high-grade stenosis is present at the origin of the right vertebral artery. Dense calcifications with a moderate left vertebral artery stenosis are present. The left vertebral artery is slightly dominant to the right throughout the neck without additional stenoses. Atherosclerotic calcifications are present at the dural margin on the right. The PICA origins are below the dural margin.  The right common carotid artery demonstrates scattered atherosclerotic changes without a significant stenosis relative to the more distal vessels. Additional atherosclerotic changes are present in the proximal right internal carotid artery and at the bifurcation without a significant stenosis relative to the more distal vessel. There is focal irregularity of the distal right ICA just below the skullbase. There is no significant stenosis and any of these lesions relative to the more distal vessel.  The left common carotid artery demonstrates a 50% stenosis proximally. The lumen is patent through the carotid bifurcations stent. Minimal atherosclerotic changes are present beyond the stent without a significant stool cyst.  The thyroid is heterogeneous without a discrete lesion. Small moderate bilateral pleural effusions are noted. Emphysema is present. A 15 mm right peritracheal lymph node is evident.  Multilevel endplate degenerative changes are noted in the cervical spine from C3-4 through C7-T1. No focal lytic or blastic lesions are present.  Review of the MIP images confirms the above findings.  IMPRESSION: 1. Stable 10 mm hyperdense lesion in the high left parietal lobe with surrounding vasogenic edema likely accounts for the patient's symptoms. 2. Moderate stenosis in the distal right M1 segment. 3. Moderate stenosis of the cavernous right ICA just proximal to the anterior genu. 4. Advanced atherosclerotic changes at the aortic arch  without focal stenosis or aneurysm. 5. Extensive atherosclerotic changes within the right common carotid artery, at the bifurcation, and in the distal right ICA without a significant stenosis. 6. 50% stenosis of the proximal left common carotid artery. 7. Patent left carotid bifurcations stent. 8. 15 mm right peritracheal  lymph node. While this may be reactive, neoplasm is not excluded. 9. Bilateral pleural effusions. 10. Emphysema.   Electronically Signed   By: Gennette Pac M.D.   On: 11/18/2014 11:11   Dg Chest Port 1 View  11/19/2014   CLINICAL DATA:  Shortness of breath  EXAM: PORTABLE CHEST - 1 VIEW  COMPARISON:  11/17/2014  FINDINGS: Cardiac shadow is stable. Postoperative changes are again seen as well as a pacemaker. No pneumothorax is noted. No sizable effusion is seen. No focal infiltrate is noted. Persistent scarring in the right lung base is seen.  IMPRESSION: No acute abnormality noted.  No change from the prior exam.   Electronically Signed   By: Alcide Clever M.D.   On: 11/19/2014 10:51     EKG Interpretation   Date/Time:  Sunday November 17 2014 12:54:00 EST Ventricular Rate:  70 PR Interval:  314 QRS Duration: 113 QT Interval:  436 QTC Calculation: 470 R Axis:   -49 Text Interpretation:  Sinus rhythm Prolonged PR interval Left atrial  enlargement LAD, consider left anterior fascicular block LVH with  secondary repolarization abnormality ED PHYSICIAN INTERPRETATION AVAILABLE  IN [facility] Confirmed by TEST, Record ([id number]) on 11/19/2014 7:16:39  AM      MDM   Final diagnoses:  Right sided weakness  Hemorrhagic Stroke  87yM with R sided numbness. Not TPA candidate because of durations of symptoms and CT showing small hemorrhagic stroke. Hypertensive. Labetolol and reassess. Discuss with neurology for admission.     Raeford Razor, MD 11/19/14 [phone]

## 2014-11-18 ENCOUNTER — Inpatient Hospital Stay (HOSPITAL_COMMUNITY): Payer: Medicare Other

## 2014-11-18 DIAGNOSIS — I369 Nonrheumatic tricuspid valve disorder, unspecified: Secondary | ICD-10-CM

## 2014-11-18 DIAGNOSIS — I611 Nontraumatic intracerebral hemorrhage in hemisphere, cortical: Secondary | ICD-10-CM

## 2014-11-18 LAB — HEPATIC FUNCTION PANEL
ALBUMIN: 2.6 g/dL — AB (ref 3.5–5.2)
ALK PHOS: 88 U/L (ref 39–117)
ALT: 9 U/L (ref 0–53)
AST: 14 U/L (ref 0–37)
BILIRUBIN TOTAL: 0.5 mg/dL (ref 0.3–1.2)
Bilirubin, Direct: <0.2 mg/dL (ref 0.0–0.3)
Total Protein: 6 g/dL (ref 6.0–8.3)

## 2014-11-18 LAB — SEDIMENTATION RATE: Sed Rate: 4 mm/hr (ref 0–16)

## 2014-11-18 MED ORDER — FUROSEMIDE 20 MG PO TABS
20.0000 mg | ORAL_TABLET | Freq: Every day | ORAL | Status: DC
  Administered 2014-11-18 – 2014-11-19 (×2): 20 mg via ORAL
  Filled 2014-11-18 (×2): qty 1

## 2014-11-18 MED ORDER — METOPROLOL SUCCINATE ER 100 MG PO TB24
100.0000 mg | ORAL_TABLET | Freq: Every day | ORAL | Status: DC
  Administered 2014-11-18 – 2014-11-19 (×2): 100 mg via ORAL
  Filled 2014-11-18 (×2): qty 1

## 2014-11-18 MED ORDER — PANTOPRAZOLE SODIUM 40 MG PO TBEC
40.0000 mg | DELAYED_RELEASE_TABLET | Freq: Every day | ORAL | Status: DC
  Administered 2014-11-18: 40 mg via ORAL
  Filled 2014-11-18: qty 1

## 2014-11-18 MED ORDER — ISOSORBIDE MONONITRATE ER 30 MG PO TB24
60.0000 mg | ORAL_TABLET | Freq: Every day | ORAL | Status: DC
  Administered 2014-11-18: 60 mg via ORAL
  Filled 2014-11-18 (×2): qty 1

## 2014-11-18 MED ORDER — ENSURE COMPLETE PO LIQD
237.0000 mL | Freq: Two times a day (BID) | ORAL | Status: DC
  Administered 2014-11-18 – 2014-11-19 (×2): 237 mL via ORAL

## 2014-11-18 MED ORDER — MULTI-VITAMIN/MINERALS PO TABS: 1.0000 | ORAL_TABLET | Freq: Every day | ORAL | Status: DC

## 2014-11-18 MED ORDER — SERTRALINE HCL 50 MG PO TABS
50.0000 mg | ORAL_TABLET | Freq: Every day | ORAL | Status: DC
  Administered 2014-11-18: 50 mg via ORAL
  Filled 2014-11-18 (×2): qty 1

## 2014-11-18 MED ORDER — IOHEXOL 350 MG/ML SOLN
50.0000 mL | Freq: Once | INTRAVENOUS | Status: AC | PRN
  Administered 2014-11-18: 50 mL via INTRAVENOUS

## 2014-11-18 MED ORDER — TAMSULOSIN HCL 0.4 MG PO CAPS
0.4000 mg | ORAL_CAPSULE | Freq: Every day | ORAL | Status: DC
  Administered 2014-11-18: 0.4 mg via ORAL
  Filled 2014-11-18 (×2): qty 1

## 2014-11-18 MED ORDER — ADULT MULTIVITAMIN W/MINERALS CH
1.0000 | ORAL_TABLET | Freq: Every day | ORAL | Status: DC
  Administered 2014-11-18 – 2014-11-19 (×2): 1 via ORAL
  Filled 2014-11-18 (×2): qty 1

## 2014-11-18 MED ORDER — POTASSIUM CHLORIDE ER 10 MEQ PO TBCR
10.0000 meq | EXTENDED_RELEASE_TABLET | Freq: Every day | ORAL | Status: DC
  Administered 2014-11-18 – 2014-11-19 (×2): 10 meq via ORAL
  Filled 2014-11-18 (×2): qty 1

## 2014-11-18 MED ORDER — ATORVASTATIN CALCIUM 10 MG PO TABS
20.0000 mg | ORAL_TABLET | Freq: Every day | ORAL | Status: DC
  Administered 2014-11-18: 20 mg via ORAL
  Filled 2014-11-18 (×2): qty 1

## 2014-11-18 MED ORDER — AMLODIPINE BESYLATE 5 MG PO TABS
5.0000 mg | ORAL_TABLET | Freq: Every day | ORAL | Status: DC
  Administered 2014-11-18: 5 mg via ORAL
  Filled 2014-11-18 (×2): qty 1

## 2014-11-18 MED ORDER — GABAPENTIN 300 MG PO CAPS
300.0000 mg | ORAL_CAPSULE | Freq: Two times a day (BID) | ORAL | Status: DC
  Administered 2014-11-18 – 2014-11-19 (×3): 300 mg via ORAL
  Filled 2014-11-18 (×4): qty 1

## 2014-11-18 NOTE — Evaluation (Signed)
Physical Therapy Evaluation Patient Details Name: [name] Case MRN: [id number] DOB: Jan 04, 1927 Today's Date: 11/18/2014   History of Present Illness  pt presents wit Intraparenchymal Hemorrhage.    Clinical Impression  Pt seems to be at his baseline level of mobility.  Pt without LOB and demos good awareness of mobility and safety.  No further PT needs at this time, will sign off.      Follow Up Recommendations No PT follow up;Supervision - Intermittent    Equipment Recommendations  None recommended by PT    Recommendations for Other Services       Precautions / Restrictions Precautions Precautions: None Restrictions Weight Bearing Restrictions: No      Mobility  Bed Mobility Overal bed mobility: Modified Independent                Transfers Overall transfer level: Modified independent Equipment used: None                Ambulation/Gait Ambulation/Gait assistance: Modified independent (Device/Increase time) Ambulation Distance (Feet): 150 Feet Assistive device: None Gait Pattern/deviations: Step-through pattern     General Gait Details: pt able to perform changes in speed, negotiate around obstacles, and perform head turns without deficits.  pt seems to be at baseline level of mobility.    Stairs            Wheelchair Mobility    Modified Rankin (Stroke Patients Only) Modified Rankin (Stroke Patients Only) Pre-Morbid Rankin Score: No significant disability Modified Rankin: No significant disability     Balance Overall balance assessment: Modified Independent                                           Pertinent Vitals/Pain Pain Assessment: No/denies pain    Home Living Family/patient expects to be discharged to:: Private residence Living Arrangements: Alone Available Help at Discharge: Family;Friend(s);Available PRN/intermittently Type of Home: House Home Access: Stairs to enter   Entrance Stairs-Number of Steps:  2 Home Layout: One level Home Equipment: Grab bars - tub/shower;Walker - 2 wheels;Cane - single point      Prior Function Level of Independence: Independent         Comments: 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  pt lost his wife  ~1 yr ago.       Hand Dominance        Extremity/Trunk Assessment   Upper Extremity Assessment: Defer to OT evaluation           Lower Extremity Assessment: Overall WFL for tasks assessed      Cervical / Trunk Assessment: Normal  Communication   Communication: No difficulties  Cognition Arousal/Alertness: Awake/alert Behavior During Therapy: WFL for tasks assessed/performed Overall Cognitive Status: Within Functional Limits for tasks assessed                      General Comments      Exercises        Assessment/Plan    PT Assessment Patent does not need any further PT services  PT Diagnosis Difficulty walking   PT Problem List    PT Treatment Interventions     PT Goals (Current goals can be found in the Care Plan section) Acute Rehab PT Goals PT Goal Formulation: All assessment and education complete, DC therapy    Frequency  Barriers to discharge        Co-evaluation               End of Session   Activity Tolerance: Patient tolerated treatment well Patient left: in bed;with call bell/phone within reach Nurse Communication: Mobility status         Time: [id number] PT Time Calculation (min) (ACUTE ONLY): 19 min   Charges:   PT Evaluation $Initial PT Evaluation Tier I: 1 Procedure PT Treatments $Gait Training: 8-22 mins   PT G CodesSunny [name]:          [name] [name], [place]PT [phone] 11/18/2014, 1:49 PM

## 2014-11-18 NOTE — Evaluation (Signed)
Speech Language Pathology Evaluation Patient Details Name: [name] MRN: [id number] DOB: 1927/08/18 Today's Date: 11/18/2014 Time: [id number] SLP Time Calculation (min) (ACUTE ONLY): 12 min  Problem List:  Patient Active Problem List   Diagnosis Date Noted  . ICH (intracerebral hemorrhage) 11/17/2014  . CAP (community acquired pneumonia) 06/09/2014  . CKD (chronic kidney disease) stage 3, GFR 30-59 ml/min 06/09/2014  . CHF (congestive heart failure) 06/09/2014  . Unstable angina 01/16/2014  . COPD with acute exacerbation 01/04/2014  . Influenza with respiratory manifestations 01/02/2014  . Acute on chronic kidney failure 01/02/2014  . Leukocytosis, unspecified 01/02/2014  . Malnutrition of moderate degree 01/01/2014  . Acute bronchitis 12/30/2013  . Chronic combined systolic and diastolic CHF (congestive heart failure) 12/30/2013  . NSTEMI (non-ST elevated myocardial infarction) 12/30/2013  . CAD (coronary artery disease) of artery bypass graft 12/13/2013  . Sick sinus syndrome 12/13/2013  . Acute on chronic combined systolic and diastolic CHF (congestive heart failure) 05/04/2013  . HTN (hypertension) 05/04/2013  . Hyperlipidemia 05/04/2013  . Tobacco abuse 05/04/2013  . History of CVA (cerebrovascular accident) 05/04/2013  . Thrombocytopenia 05/04/2013  . Occlusion and stenosis of carotid artery without mention of cerebral infarction 02/07/2012   Past Medical History:  Past Medical History  Diagnosis Date  . Hypertension   . CAD (coronary artery disease)     a. s/p MI in 1988;  b. s/p CABG in 2000 ([name]) x 4, LIMA to LAD, SVG to diagonal, [facility], SVG to PDA;  c. 12/2013 NSTEMI/abnl MV;  d. 01/2014 Cath/PCI: native 3VD, VG->RCA 100, VG->OM aneurysmal 40ost/m, 4432m(5.0x12 Veriflex BMS), VG->Diag aneurysmal, LIMA->LAD nl.  . TIA (transient ischemic attack)   . Hyperlipidemia   . COPD (chronic obstructive pulmonary disease)   . CVA (cerebral infarction) 07/2011     Remote left brain infarct with sensory deficits and had a left internal carotid stent placed by Dr. Corliss Skainseveshwar at that time, no recurrent CVA or TIAs or CNS events.  Marland Kitchen. PAD (peripheral artery disease)     Stable. Had a past RFPBG by Dr. Arbie CookeyEarly in 2009. this graft is occluded with reconstitution in the popliteal artery, which was patent. Right tibial was occluded and 2-vessel runoff on angiography done by Dr. Myra GianottiBrabham in 12/2010.  . Sick sinus syndrome     PPM implanted for this and syncope   . Chronic systolic CHF (congestive heart failure)     Hospitalization for CHF from 05/04/13-05/06/13. Treated medically. Had possible pneumonia with short course of antibiotics.  . Abdominal bruit     Loud  . AAA (abdominal aortic aneurysm)     Has a known AAA of 3.2 x 3.4 cm by abdominal untrasound 03/27/12. Followup abdominal ultrasound on 03/29/13 showed a diameter of 3.6 cm, which is stable  . Peripheral vascular disease   . Hypothyroid     On supplement "don't know if I take RX or not for my thyroid" (01/16/2014)  . Stroke    Past Surgical History:  Past Surgical History  Procedure Laterality Date  . Femoral bypass Right ?2001  . Carotid stent Left 06/23/11    By Dr. Myra GianottiBrabham  . Coronary artery bypass graft  2000    1st in 2000 ([name]) x 4, LIMA to LAD, SVG to diagonal, SVG to OM4, SVG to PDA. Re-cath in May 2012 with patent LIMA to LAD, patent SVG to diagonal, patent SVG to OM with left-to-right collaterals to an occluded right.  . Coronary angioplasty with stent placement  2001-01/16/2014    "think today makes #4" (01/16/2014)  . Cardiac catheterization  2000  . Inguinal hernia repair Right 1980's  . Insert / replace / remove pacemaker      implanted 2003 by Dr [name] with gen change (MDT [id number]) 08/2010 by [facility]  . Cataract extraction w/ intraocular lens  implant, bilateral Bilateral   . Abdominal aortagram N/A 02/22/2012    Procedure: ABDOMINAL AORTAGRAM;  Surgeon: Nada LibmanVance W Brabham, MD;  [facility];  Service: Cardiovascular;  Laterality: N/A;  . Carotid angiogram Bilateral 02/22/2012    Procedure: CAROTID ANGIOGRAM;  Surgeon: Nada LibmanVance W Brabham, MD;  [facility];  Service: Cardiovascular;  Laterality: Bilateral;  . Left heart catheterization with coronary/graft angiogram N/A 01/16/2014    Procedure: LEFT HEART CATHETERIZATION WITH Isabel CapriceORONARY/GRAFT ANGIOGRAM;  Surgeon: Kathleene Hazelhristopher D McAlhany, MD;  [facility];  Service: Cardiovascular;  Laterality: N/A;   HPI:  80 y.o. male history of hypertension, coronary artery disease, TIA, hyperlipidemia, COPD, stroke (07/2011) and AAA, presenting with new onset numbness involving right arm and leg as well as continued pain involving his right leg. CT Small focus of intraparenchymal hemorrhage in the left centrum semiovale measuring 9.5 mm   Assessment / Plan / Recommendation Clinical Impression  Pt's speech-language and cognition appear WFL's. Verbal problem solving and working memory were adequate.  He reports managing finances without difficulty and uses a pill box to organize and recall medicines.  No further ST needed at this time.    SLP Assessment  Patient does not need any further Speech Lanaguage Pathology Services    Follow Up Recommendations  None    Frequency and Duration        Pertinent Vitals/Pain Pain Assessment: No/denies pain       SLP Evaluation Prior Functioning  Cognitive/Linguistic Baseline: Within functional limits Type of Home: House  Lives With: Alone Available Help at Discharge: Family;Friend(s);Available PRN/intermittently   Cognition  Overall Cognitive Status: Within Functional Limits for tasks assessed Arousal/Alertness: Awake/alert Orientation Level: Oriented X4 Safety/Judgment: Appears intact    Comprehension  Auditory Comprehension Overall Auditory Comprehension: Appears within functional limits for tasks assessed Visual Recognition/Discrimination Discrimination: Not tested     Expression Expression Primary Mode of Expression: Verbal Written Expression Dominant Hand: Right Written Expression: Not tested   Oral / Motor Oral Motor/Sensory Function Overall Oral Motor/Sensory Function: Appears within functional limits for tasks assessed Motor Speech Overall Motor Speech: Appears within functional limits for tasks assessed Intelligibility: Intelligible   GO     Royce MacadamiaLitaker, [name] 11/18/2014, 2:58 PM   Breck CoonsLisa [name] [name] M.Ed [employer]SLP Pager [phone]

## 2014-11-18 NOTE — Progress Notes (Signed)
Echocardiogram 2D Echocardiogram has been performed.  [name] 11/18/2014, 11:07 AM

## 2014-11-18 NOTE — Progress Notes (Signed)
STROKE TEAM PROGRESS NOTE   HISTORY [name] is an 80 y.o. male history of hypertension, coronary artery disease, TIA, hyperlipidemia, COPD, stroke and AAA, presenting with new onset numbness involving right arm and leg as well as continued pain involving his right leg. He's had pain involving the leg off and on for the past 3 weeks which seems to be related to activity. Numbness is of new onset and occurred overnight. He was last known well at bedtime at 9 PM tonight 11/16/2014. CT scan of his head showed a small intraparenchymal hemorrhage involving the left centrum semi-ovale. NIH stroke score was 2. He has been taking aspirin and Plavix daily. Patient also has a cardiac pacemaker. Patient was not administered TPA secondary to beyond time window for treatment consideration. He was admitted to the neuro ICU for further evaluation and treatment.   SUBJECTIVE (INTERVAL HISTORY) His RN is at the bedside, no family.  Overall he feels his condition is stable.    OBJECTIVE Temp:  [97.4 F (36.3 C)-98.3 F (36.8 C)] 98.3 F (36.8 C) (12/14 0755) Pulse Rate:  [68-92] 72 (12/14 0800) Cardiac Rhythm:  [-] Normal sinus rhythm (12/13 2000) Resp:  [7-28] 15 (12/14 0800) BP: (127-181)/(53-97) 147/68 mmHg (12/14 0800) SpO2:  [89 %-100 %] 94 % (12/14 0800) Weight:  [63.9 kg (140 lb 14 oz)-64.864 kg (143 lb)] 63.9 kg (140 lb 14 oz) (12/13 1651)  No results for input(s): GLUCAP in the last 168 hours.  Recent Labs Lab 11/17/14 1252  NA 142  K 3.5*  CL 105  CO2 27  GLUCOSE 125*  BUN 15  CREATININE 1.12  CALCIUM 8.5   No results for input(s): AST, ALT, ALKPHOS, BILITOT, PROT, ALBUMIN in the last 168 hours.  Recent Labs Lab 11/17/14 1252  WBC 7.3  NEUTROABS 4.3  HGB 14.5  HCT 42.4  MCV 94.2  PLT 136*   No results for input(s): CKTOTAL, CKMB, CKMBINDEX, TROPONINI in the last 168 hours. No results for input(s): LABPROT, INR in the last 72 hours. No results for input(s): COLORURINE,  LABSPEC, PHURINE, GLUCOSEU, HGBUR, BILIRUBINUR, KETONESUR, PROTEINUR, UROBILINOGEN, NITRITE, LEUKOCYTESUR in the last 72 hours.  Invalid input(s): APPERANCEUR     Component Value Date/Time   CHOL 101 07/06/2011 0635   TRIG 61 07/06/2011 0635   HDL 39* 07/06/2011 0635   CHOLHDL 2.6 07/06/2011 0635   VLDL 12 07/06/2011 0635   LDLCALC 50 07/06/2011 0635   Lab Results  Component Value Date   HGBA1C 6.1* 07/05/2011      Component Value Date/Time   LABOPIA NEGATIVE 06/18/2011 0403   COCAINSCRNUR NEGATIVE 06/18/2011 0403   LABBENZ NEGATIVE 06/18/2011 0403   AMPHETMU NEGATIVE 06/18/2011 0403    No results for input(s): ETH in the last 168 hours.  Ct Head Wo Contrast  11/17/2014   CLINICAL DATA:  Right-sided weakness. No history of stroke. History of hypertension.  EXAM: CT HEAD WITHOUT CONTRAST  TECHNIQUE: Contiguous axial images were obtained from the base of the skull through the vertex without intravenous contrast.  COMPARISON:  12/30/2013  FINDINGS: There is moderate central and cortical atrophy. Periventricular white matter changes are consistent with small vessel disease.  Within the left centrum semiovale there is a small rounded focus of hemorrhage measuring 9.5 mm. There is little if any surrounding edema or mass effect. No acute infarct. No intracranial mass.  Bone windows are unremarkable. There is moderate atherosclerotic calcification of the internal carotid arteries.  IMPRESSION: 1. Small focus of intraparenchymal hemorrhage  in the left centrum semiovale measuring 9.5 mm. 2. Little if any mass effect or surrounding edema. 3. Moderate atrophy and small vessel disease. 4. Critical Value/emergent results were called by telephone at the time of interpretation on 11/17/2014 at 1:33 pm to Dr. Raeford RazorSTEPHEN KOHUT , who verbally acknowledged these results.   Electronically Signed   By: Rosalie GumsBeth  Brown M.D.   On: 11/17/2014 13:35   Chest Port 1 View  11/17/2014   CLINICAL DATA:  Routine adult  health maintenance; R leg pain; lump in mid upper leg with throbbing pain and feet feel cold and stingingHx: HTN, smoker 1/2 pk/day; pacemaker 2000, open heart 4 bypass 2001  EXAM: PORTABLE CHEST - 1 VIEW  COMPARISON:  06/09/2014  FINDINGS: Stable changes from prior CABG surgery. Cardiac silhouette is mildly enlarged. Aorta is uncoiled. No mediastinal or hilar masses. There is interstitial thickening most evident in the lung bases. Decreased lung markings is noted in the upper lobes suggesting emphysema. No lung consolidation or edema. No pleural effusion or pneumothorax.  Left anterior chest wall sequential pacemaker is stable with its leads projecting in the right atrium and right ventricle.  Bony thorax is demineralized but grossly intact.  IMPRESSION: No acute cardiopulmonary disease   Electronically Signed   By: Amie Portlandavid  Ormond M.D.   On: 11/17/2014 16:54     PHYSICAL EXAM Frail elderly african [place] male not in distress.Awake alert. Afebrile. Head is nontraumatic. Neck is supple without bruit. Hearing is normal. Cardiac exam no murmur or gallop. Lungs are clear to auscultation. Distal pulses are well felt. Neurological Exam ;  Awake  Alert oriented x 3. Normal speech and language.eye movements full without nystagmus.fundi were not visualized. Vision acuity and fields appear normal. Hearing is normal. Palatal movements are normal. Face symmetric. Tongue midline. Normal strength, tone, reflexes and coordination. Normal sensation. Gait deferred. ASSESSMENT/PLAN [name] is a 80 y.o. male with history of hypertension, coronary artery disease, TIA, hyperlipidemia, COPD, stroke and AAA presenting with right arm and leg numbness as well as right lower extremity pain. He did not receive IV t-PA due to delay in arrival.   Stroke:  Dominant left centrum semi-ovale hemorrhage at the grey/white junction, most likely secondary to hypertension, though given age and location, concern for amyloid or  metastatic disease  Resultant  Right side numbness, no weakness  MRI  / MRA  Pacer  CT angio head and  Neck ordered, results pending   2D Echo  pending   SCDs for VTE prophylaxis  Diet Heart thin liquids  aspirin 81 mg orally every day and clopidogrel 75 mg orally every day prior to admission  Ongoing aggressive stroke risk factor management  Therapy recommendations:  Pending. Ok to be OOB  Disposition:  Anticipate return home  Hypertension  BP slightly elevated just after arrival at 179/80  Home meds:   Norvasc, imdur, toprol  Placed on low dose Cardene during the night after getting frequent labetalol injections to keep < 160, now off   Keep SBP < 160 for first 24h  Prn labetalol  Stable currently  Resume home meds  Hyperlipidemia  Home meds:  lipitor 40 resumed in hospital  Intolerant to pravachol and simvastatin   LDL not drawn  Continue statin at discharge  Other Stroke Risk Factors  Advanced age  Cigarette smoker, advised to stop smoking  Hx stroke/TIA  Coronary artery disease - PTCA, stent, CABG  Carotid stenosis w/p L ICA stent 7/12 (Brabham)  Other Active Problems  COPD  PAD  SSS, has pacer  Chronic systolic CHF, on home lasix  AAA  PVD - fem bypass  hypothyroid  Hospital day # 1  Rhoderick MoodyBIBY,SHARON  [facility] See Amion for Pager information 11/18/2014 9:31 AM  I have personally examined this patient, reviewed notes, independently viewed imaging studies, participated in medical decision making and plan of care. I have made any additions or clarifications directly to the above note. Agree with note above. Patient is doing remarkably well and has only mild subjective paresthesias and no objective deficits on exam from a small left subcortical hemorrhage etiology of which is probably hypertensive though given his advanced age metastasis or,amyloid angiopathy are also possibilities. Will continue close neurological  follow-up and strict control of blood pressure.I spoke to patient's daughter the phone and answered questions. Repeat CT angio brain as cannot do MRI due to pacer This patient is critically ill and at significant risk of neurological worsening, death and care requires constant monitoring of vital signs, hemodynamics,respiratory and cardiac monitoring,review of multiple databases, neurological assessment, discussion with family, other specialists and medical decision making of high complexity.I have made any additions or clarifications directly to the above note.  I spent 30 minutes of neurocritical care time  in the care of  this patient.  Delia HeadyPramod Jacquis Paxton, MD Medical Director [facility] Pager: [phone] 11/18/2014 2:32 PM   To contact Stroke Continuity provider, please refer to [email or web address]. After hours, contact General Neurology

## 2014-11-18 NOTE — Progress Notes (Signed)
UR completed.  Jeramiah Mccaughey, RN BSN [facility] CCM Trauma/Neuro ICU Case Manager [phone]  

## 2014-11-19 ENCOUNTER — Inpatient Hospital Stay (HOSPITAL_COMMUNITY): Payer: Medicare Other

## 2014-11-19 ENCOUNTER — Ambulatory Visit: Payer: Medicare Other | Admitting: Nurse Practitioner

## 2014-11-19 DIAGNOSIS — I639 Cerebral infarction, unspecified: Secondary | ICD-10-CM | POA: Insufficient documentation

## 2014-11-19 DIAGNOSIS — M6289 Other specified disorders of muscle: Secondary | ICD-10-CM

## 2014-11-19 DIAGNOSIS — R531 Weakness: Secondary | ICD-10-CM | POA: Insufficient documentation

## 2014-11-19 DIAGNOSIS — R599 Enlarged lymph nodes, unspecified: Secondary | ICD-10-CM

## 2014-11-19 DIAGNOSIS — J90 Pleural effusion, not elsewhere classified: Secondary | ICD-10-CM

## 2014-11-19 LAB — PRO B NATRIURETIC PEPTIDE: PRO B NATRI PEPTIDE: 4549 pg/mL — AB (ref 0–450)

## 2014-11-19 MED ORDER — LIVING BETTER WITH HEART FAILURE BOOK: Freq: Once | Status: DC

## 2014-11-19 MED ORDER — ASPIRIN 81 MG PO TBEC: 81.0000 mg | DELAYED_RELEASE_TABLET | Freq: Every day | ORAL | Status: DC

## 2014-11-19 NOTE — Evaluation (Signed)
Occupational Therapy Evaluation Patient Details Name: Fransico HimRaymond Esterly MRN: [id number] DOB: [birth year] Today's Date: 11/19/2014    History of Present Illness pt presents wit Intraparenchymal Hemorrhage.     Clinical Impression   Pt was independent prior to admission and continues to demonstrate independence in self care and ADL mobility.  No further OT needs.  Pt has longstanding numbness in his R fingers, per his report.  Numbness does not interfere with function.    Follow Up Recommendations  No OT follow up    Equipment Recommendations  None recommended by OT    Recommendations for Other Services       Precautions / Restrictions Precautions Precautions: None Restrictions Weight Bearing Restrictions: No      Mobility Bed Mobility Overal bed mobility: Modified Independent                Transfers Overall transfer level: Modified independent Equipment used: None                  Balance                                            ADL Overall ADL's : Independent                                             Vision                 Additional Comments: wears bifocals, no vision changes   Perception     Praxis      Pertinent Vitals/Pain Pain Assessment: No/denies pain     Hand Dominance Right   Extremity/Trunk Assessment Upper Extremity Assessment Upper Extremity Assessment: Overall WFL for tasks assessed (longstanding numbness in R finger tips)   Lower Extremity Assessment Lower Extremity Assessment: Defer to PT evaluation   Cervical / Trunk Assessment Cervical / Trunk Assessment: Normal   Communication Communication Communication: No difficulties   Cognition Arousal/Alertness: Awake/alert Behavior During Therapy: WFL for tasks assessed/performed Overall Cognitive Status: Within Functional Limits for tasks assessed                     General Comments       Exercises        Shoulder Instructions      Home Living Family/patient expects to be discharged to:: Private residence Living Arrangements: Alone Available Help at Discharge: Family;Friend(s);Available PRN/intermittently Type of Home: House Home Access: Stairs to enter Enter[employer] CorporationEntrance Stairs-Number of Steps: 2   Home Layout: One level     Bathroom Shower/Tub: Chief Strategy OfficerTub/shower unit   Bathroom Toilet: Handicapped height     Home Equipment: Grab bars - tub/shower;Walker - 2 wheels;Gilmer MorCane - single point      Lives With: Alone    Prior Functioning/Environment Level of Independence: Independent        Comments: 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  pt lost his wife  ~1 yr ago.      OT Diagnosis:     OT Problem List:     OT Treatment/Interventions:      OT Goals(Current goals can be found in the care plan section) Acute Rehab OT Goals Patient Stated Goal: home today  OT Frequency:  Barriers to D/C:            Co-evaluation              End of Session Nurse Communication: Mobility status  Activity Tolerance: Patient tolerated treatment well Patient left: in chair;with call bell/phone within reach   Time: 0840-0905 OT Time Calculation (min): 25 min Charges:  OT General Charges $OT Visit: 1 Procedure OT Evaluation $Initial OT Evaluation Tier I: 1 Procedure OT Treatments $Self Care/Home Management : 8-22 mins G-Codes:    Evern BioMayberry, Kelsee Preslar Lynn 11/19/2014, 9:05 AM  [phone]

## 2014-11-19 NOTE — Progress Notes (Signed)
STROKE TEAM PROGRESS NOTE   HISTORY Fransico HimRaymond Schappell is an 80 y.o. male history of hypertension, coronary artery disease, TIA, hyperlipidemia, COPD, stroke and AAA, presenting with new onset numbness involving right arm and leg as well as continued pain involving his right leg. He's had pain involving the leg off and on for the past 3 weeks which seems to be related to activity. Numbness is of new onset and occurred overnight. He was last known well at bedtime at 9 PM tonight 11/16/2014. CT scan of his head showed a small intraparenchymal hemorrhage involving the left centrum semi-ovale. NIH stroke score was 2. He has been taking aspirin and Plavix daily. Patient also has a cardiac pacemaker. Patient was not administered TPA secondary to hemorrhage. He was admitted to the neuro ICU for further evaluation and treatment.   SUBJECTIVE (INTERVAL HISTORY) "I live alone, but my family is within hollering distance".   OBJECTIVE Temp:  [97.8 F (36.6 C)-98.5 F (36.9 C)] 98.5 F (36.9 C) (12/15 0803) Pulse Rate:  [69-84] 72 (12/15 0800) Cardiac Rhythm:  [-] Atrial paced (12/15 0700) Resp:  [12-24] 16 (12/15 0800) BP: (109-177)/(51-140) 153/55 mmHg (12/15 0800) SpO2:  [90 %-100 %] 100 % (12/15 0800)  No results for input(s): GLUCAP in the last 168 hours.  Recent Labs Lab 11/17/14 1252  NA 142  K 3.5*  CL 105  CO2 27  GLUCOSE 125*  BUN 15  CREATININE 1.12  CALCIUM 8.5    Recent Labs Lab 11/18/14 1000  AST 14  ALT 9  ALKPHOS 88  BILITOT 0.5  PROT 6.0  ALBUMIN 2.6*    Recent Labs Lab 11/17/14 1252  WBC 7.3  NEUTROABS 4.3  HGB 14.5  HCT 42.4  MCV 94.2  PLT 136*   No results for input(s): CKTOTAL, CKMB, CKMBINDEX, TROPONINI in the last 168 hours. No results for input(s): LABPROT, INR in the last 72 hours. No results for input(s): COLORURINE, LABSPEC, PHURINE, GLUCOSEU, HGBUR, BILIRUBINUR, KETONESUR, PROTEINUR, UROBILINOGEN, NITRITE, LEUKOCYTESUR in the last 72  hours.  Invalid input(s): APPERANCEUR     Component Value Date/Time   CHOL 101 07/06/2011 0635   TRIG 61 07/06/2011 0635   HDL 39* 07/06/2011 0635   CHOLHDL 2.6 07/06/2011 0635   VLDL 12 07/06/2011 0635   LDLCALC 50 07/06/2011 0635   Lab Results  Component Value Date   HGBA1C 6.1* 07/05/2011      Component Value Date/Time   LABOPIA NEGATIVE 06/18/2011 0403   COCAINSCRNUR NEGATIVE 06/18/2011 0403   LABBENZ NEGATIVE 06/18/2011 0403   AMPHETMU NEGATIVE 06/18/2011 0403    No results for input(s): ETH in the last 168 hours.  Ct Angio Head W/cm &/or Wo Cm  11/18/2014   CLINICAL DATA:  Numbness to the right hand. Pain and numbness throughout the right lower extremity.  EXAM: CT ANGIOGRAPHY HEAD AND NECK  TECHNIQUE: Multidetector CT imaging of the head and neck was performed using the standard protocol during bolus administration of intravenous contrast. Multiplanar CT image reconstructions and MIPs were obtained to evaluate the vascular anatomy. Carotid stenosis measurements (when applicable) are obtained utilizing NASCET criteria, using the distal internal carotid diameter as the denominator.  CONTRAST:  50mL OMNIPAQUE IOHEXOL 350 MG/ML SOLN  COMPARISON:  CT without head 11/17/2014.  FINDINGS: CTA HEAD FINDINGS  A 10 mm high left parietal hyperdense lesion is again noted. No other focal mass lesions are present. There is no pathologic enhancement. Note is made of mild surrounding edema. A branch vessel crosses pass  the lesion. This does not appear to be a vascular lesion. Periventricular and subcortical white matter hypoattenuation is present bilaterally and addition.  Atherosclerotic calcifications are present within the cavernous internal carotid arteries bilaterally. There is moderate narrowing of the right cavernous internal carotid artery just proximal to the anterior genu relative to the more distal vessels. The terminal ICA is normal bilaterally. The left A1 segment is dominant. There is  moderate narrowing of the distal right M1 segment. The bifurcations are intact. There is moderate attenuation of distal MCA branch vessels bilaterally, worse on the right.  The left vertebral artery is slightly dominant to the right. The PICA origins are below the dural margin. Both posterior cerebral arteries originate from the basilar tip. Posterior communicating arteries are patent bilaterally.  No postcontrast enhancement is present. The dural sinuses are patent. Note is made of a a lipoma in the high left parietal scalp.  Review of the MIP images confirms the above findings.  CTA NECK FINDINGS  Marked atherosclerotic irregularity is present at the aortic arch. A 3 vessel arch configuration is present. Atherosclerotic changes are present without focal stenosis.  A high-grade stenosis is present at the origin of the right vertebral artery. Dense calcifications with a moderate left vertebral artery stenosis are present. The left vertebral artery is slightly dominant to the right throughout the neck without additional stenoses. Atherosclerotic calcifications are present at the dural margin on the right. The PICA origins are below the dural margin.  The right common carotid artery demonstrates scattered atherosclerotic changes without a significant stenosis relative to the more distal vessels. Additional atherosclerotic changes are present in the proximal right internal carotid artery and at the bifurcation without a significant stenosis relative to the more distal vessel. There is focal irregularity of the distal right ICA just below the skullbase. There is no significant stenosis and any of these lesions relative to the more distal vessel.  The left common carotid artery demonstrates a 50% stenosis proximally. The lumen is patent through the carotid bifurcations stent. Minimal atherosclerotic changes are present beyond the stent without a significant stool cyst.  The thyroid is heterogeneous without a discrete  lesion. Small moderate bilateral pleural effusions are noted. Emphysema is present. A 15 mm right peritracheal lymph node is evident.  Multilevel endplate degenerative changes are noted in the cervical spine from C3-4 through C7-T1. No focal lytic or blastic lesions are present.  Review of the MIP images confirms the above findings.  IMPRESSION: 1. Stable 10 mm hyperdense lesion in the high left parietal lobe with surrounding vasogenic edema likely accounts for the patient's symptoms. 2. Moderate stenosis in the distal right M1 segment. 3. Moderate stenosis of the cavernous right ICA just proximal to the anterior genu. 4. Advanced atherosclerotic changes at the aortic arch without focal stenosis or aneurysm. 5. Extensive atherosclerotic changes within the right common carotid artery, at the bifurcation, and in the distal right ICA without a significant stenosis. 6. 50% stenosis of the proximal left common carotid artery. 7. Patent left carotid bifurcations stent. 8. 15 mm right peritracheal lymph node. While this may be reactive, neoplasm is not excluded. 9. Bilateral pleural effusions. 10. Emphysema.   Electronically Signed   By: Gennette Pachris  Mattern M.D.   On: 11/18/2014 11:11   Ct Head Wo Contrast  11/17/2014   CLINICAL DATA:  Right-sided weakness. No history of stroke. History of hypertension.  EXAM: CT HEAD WITHOUT CONTRAST  TECHNIQUE: Contiguous axial images were obtained from the base of  the skull through the vertex without intravenous contrast.  COMPARISON:  12/30/2013  FINDINGS: There is moderate central and cortical atrophy. Periventricular white matter changes are consistent with small vessel disease.  Within the left centrum semiovale there is a small rounded focus of hemorrhage measuring 9.5 mm. There is little if any surrounding edema or mass effect. No acute infarct. No intracranial mass.  Bone windows are unremarkable. There is moderate atherosclerotic calcification of the internal carotid arteries.   IMPRESSION: 1. Small focus of intraparenchymal hemorrhage in the left centrum semiovale measuring 9.5 mm. 2. Little if any mass effect or surrounding edema. 3. Moderate atrophy and small vessel disease. 4. Critical Value/emergent results were called by telephone at the time of interpretation on 11/17/2014 at 1:33 pm to Dr. Raeford Razor , who verbally acknowledged these results.   Electronically Signed   By: Rosalie Gums M.D.   On: 11/17/2014 13:35   Ct Angio Neck W/cm &/or Wo/cm  11/18/2014   CLINICAL DATA:  Numbness to the right hand. Pain and numbness throughout the right lower extremity.  EXAM: CT ANGIOGRAPHY HEAD AND NECK  TECHNIQUE: Multidetector CT imaging of the head and neck was performed using the standard protocol during bolus administration of intravenous contrast. Multiplanar CT image reconstructions and MIPs were obtained to evaluate the vascular anatomy. Carotid stenosis measurements (when applicable) are obtained utilizing NASCET criteria, using the distal internal carotid diameter as the denominator.  CONTRAST:  50mL OMNIPAQUE IOHEXOL 350 MG/ML SOLN  COMPARISON:  CT without head 11/17/2014.  FINDINGS: CTA HEAD FINDINGS  A 10 mm high left parietal hyperdense lesion is again noted. No other focal mass lesions are present. There is no pathologic enhancement. Note is made of mild surrounding edema. A branch vessel crosses pass the lesion. This does not appear to be a vascular lesion. Periventricular and subcortical white matter hypoattenuation is present bilaterally and addition.  Atherosclerotic calcifications are present within the cavernous internal carotid arteries bilaterally. There is moderate narrowing of the right cavernous internal carotid artery just proximal to the anterior genu relative to the more distal vessels. The terminal ICA is normal bilaterally. The left A1 segment is dominant. There is moderate narrowing of the distal right M1 segment. The bifurcations are intact. There is  moderate attenuation of distal MCA branch vessels bilaterally, worse on the right.  The left vertebral artery is slightly dominant to the right. The PICA origins are below the dural margin. Both posterior cerebral arteries originate from the basilar tip. Posterior communicating arteries are patent bilaterally.  No postcontrast enhancement is present. The dural sinuses are patent. Note is made of a a lipoma in the high left parietal scalp.  Review of the MIP images confirms the above findings.  CTA NECK FINDINGS  Marked atherosclerotic irregularity is present at the aortic arch. A 3 vessel arch configuration is present. Atherosclerotic changes are present without focal stenosis.  A high-grade stenosis is present at the origin of the right vertebral artery. Dense calcifications with a moderate left vertebral artery stenosis are present. The left vertebral artery is slightly dominant to the right throughout the neck without additional stenoses. Atherosclerotic calcifications are present at the dural margin on the right. The PICA origins are below the dural margin.  The right common carotid artery demonstrates scattered atherosclerotic changes without a significant stenosis relative to the more distal vessels. Additional atherosclerotic changes are present in the proximal right internal carotid artery and at the bifurcation without a significant stenosis relative to the more  distal vessel. There is focal irregularity of the distal right ICA just below the skullbase. There is no significant stenosis and any of these lesions relative to the more distal vessel.  The left common carotid artery demonstrates a 50% stenosis proximally. The lumen is patent through the carotid bifurcations stent. Minimal atherosclerotic changes are present beyond the stent without a significant stool cyst.  The thyroid is heterogeneous without a discrete lesion. Small moderate bilateral pleural effusions are noted. Emphysema is present. A 15 mm  right peritracheal lymph node is evident.  Multilevel endplate degenerative changes are noted in the cervical spine from C3-4 through C7-T1. No focal lytic or blastic lesions are present.  Review of the MIP images confirms the above findings.  IMPRESSION: 1. Stable 10 mm hyperdense lesion in the high left parietal lobe with surrounding vasogenic edema likely accounts for the patient's symptoms. 2. Moderate stenosis in the distal right M1 segment. 3. Moderate stenosis of the cavernous right ICA just proximal to the anterior genu. 4. Advanced atherosclerotic changes at the aortic arch without focal stenosis or aneurysm. 5. Extensive atherosclerotic changes within the right common carotid artery, at the bifurcation, and in the distal right ICA without a significant stenosis. 6. 50% stenosis of the proximal left common carotid artery. 7. Patent left carotid bifurcations stent. 8. 15 mm right peritracheal lymph node. While this may be reactive, neoplasm is not excluded. 9. Bilateral pleural effusions. 10. Emphysema.   Electronically Signed   By: Gennette Pac M.D.   On: 11/18/2014 11:11   Chest Port 1 View  11/17/2014   CLINICAL DATA:  Routine adult health maintenance; R leg pain; lump in mid upper leg with throbbing pain and feet feel cold and stingingHx: HTN, smoker 1/2 pk/day; pacemaker 2000, open heart 4 bypass 2001  EXAM: PORTABLE CHEST - 1 VIEW  COMPARISON:  06/09/2014  FINDINGS: Stable changes from prior CABG surgery. Cardiac silhouette is mildly enlarged. Aorta is uncoiled. No mediastinal or hilar masses. There is interstitial thickening most evident in the lung bases. Decreased lung markings is noted in the upper lobes suggesting emphysema. No lung consolidation or edema. No pleural effusion or pneumothorax.  Left anterior chest wall sequential pacemaker is stable with its leads projecting in the right atrium and right ventricle.  Bony thorax is demineralized but grossly intact.  IMPRESSION: No acute  cardiopulmonary disease   Electronically Signed   By: Amie Portland M.D.   On: 11/17/2014 16:54   2D Echocardiogram  EF 45% with no source of embolus. Diffuse hypokinesis.   PHYSICAL EXAM Frail elderly african [place] male not in distress.Awake alert. Afebrile. Head is nontraumatic. Neck is supple without bruit. Hearing is normal. Cardiac exam no murmur or gallop. Lungs are clear to auscultation. Distal pulses are well felt. Neurological Exam ;  Awake  Alert oriented x 3. Normal speech and language.eye movements full without nystagmus.fundi were not visualized. Vision acuity and fields appear normal. Hearing is normal. Palatal movements are normal. Face symmetric. Tongue midline. Normal strength, tone, reflexes and coordination. Normal sensation. Gait deferred.   ASSESSMENT/PLAN Mr. Colson Barco is a 80 y.o. male with history of hypertension, coronary artery disease, TIA, hyperlipidemia, COPD, stroke and AAA presenting with right arm and leg numbness as well as right lower extremity pain. He did not receive IV t-PA due to hemorrhage.   Stroke:  Dominant left centrum semi-ovale hemorrhage at the grey/white junction, most likely secondary to hypertension, though given age and location, concern for amyloid or metastatic  disease  Resultant  Right side numbness, no weakness  MRI  / MRA  Pacer  CT angio head and  Neck stable hemorrhage. mild intracranial atherosclerosis. ICA atherosclerotic changes. Patent left carotid bifurcations stent. 15 mm right peritracheal lymph node. Bilateral pleural effusions. Emphysema.   2D Echo No source of embolus    SCDs for VTE prophylaxis  Diet Heart thin liquids  aspirin 81 mg orally every day and clopidogrel 75 mg orally every day prior to admission  Ongoing aggressive stroke risk factor management  Therapy recommendations:  No PT, no OT  Transfer to floor for now  Disposition:  ? D/c home today or tomorrow based on pulmonary plans  Peritracheal  Lymph node  Large  Incidental finding  Pulm consult to address given suspicion for metastatic source of hemorrhage  Hypertension  BP slightly elevated just after arrival at 179/80  Home meds:   Norvasc, imdur, toprol  Placed on low dose Cardene during the night after getting frequent labetalol injections to keep < 160, now off   Increase SBP < 180 now  Prn labetalol  Stable currently  Hyperlipidemia  Home meds:  lipitor 40 resumed in hospital  Intolerant to pravachol and simvastatin   LDL not drawn  Continue statin at discharge  Other Stroke Risk Factors  Advanced age  Cigarette smoker, advised to stop smoking  Hx stroke/TIA  Coronary artery disease - PTCA, stent, CABG  Carotid stenosis w/p L ICA stent 7/12 ([place])  Other Active Problems  COPD  PAD  SSS, has pacer  Chronic systolic CHF, on home lasix  AAA  PVD - fem bypass  hypothyroid  Hospital day # 2  [facility] See Amion for Pager information 11/19/2014 9:24 AM  I have personally examined this patient, reviewed notes, independently viewed imaging studies, participated in medical decision making and plan of care. I have made any additions or clarifications directly to the above note. Agree with note above. I spoke with the patient's son over the phone and answered questions. Dischargehome today with outpatient follow-up with pulmonary for the perihilar lymph node. Start aspirin after 2 weeks  Delia Heady, MD Medical Director [facility] Pager: [phone] 11/19/2014 5:05 PM     To contact Stroke Continuity provider, please refer to [email or web address]. After hours, contact General Neurology

## 2014-11-19 NOTE — Discharge Summary (Signed)
Stroke Discharge Summary  Patient ID: [name]   MRN: [id number]      DOB: 03/07/1927  Date of Admission: 11/17/2014 Date of Discharge: 11/19/2014  Attending Physician:  Delia Heady, MD, Stroke MD  Consulting Physician(s):    Cyril Mourning, MD (pulmonary/intensive care) Patient's PCP:  Delorse Lek, MD    DISCHARGE DIAGNOSIS:  Principal Problem:   ICH (intracerebral hemorrhage) - Stroke: Dominant left centrum semi-ovale hemorrhage at the grey/white junction, most likely secondary to hypertension, though given age and location, concern for amyloid or metastatic disease Active Problems:   Enlarged Peritracheal Lymph node   Small bilateral pleural effusion   Acute on chronic combined systolic and diastolic CHF (congestive heart failure)   HTN (hypertension)   Hyperlipidemia   Tobacco abuse   History of CVA (cerebrovascular accident)   Thrombocytopenia   CAD (coronary artery disease) of artery bypass graft   COPD   PAD   SSS, has pacer   AAA   Hypothyroid   Emphysema    Past Medical History  Diagnosis Date  . Hypertension   . CAD (coronary artery disease)     a. s/p MI in 1988;  b. s/p CABG in 2000 (Dr. Barry Dienes) x 4, LIMA to LAD, SVG to diagonal, SVG to [facility], SVG to PDA;  c. 12/2013 NSTEMI/abnl MV;  d. 01/2014 Cath/PCI: native 3VD, VG->RCA 100, VG->OM aneurysmal 40ost/m, 33m(5.0x12 Veriflex BMS), VG->Diag aneurysmal, LIMA->LAD nl.  . TIA (transient ischemic attack)   . Hyperlipidemia   . COPD (chronic obstructive pulmonary disease)   . CVA (cerebral infarction) 07/2011    Remote left brain infarct with sensory deficits and had a left internal carotid stent placed by Dr. [name] at that time, no recurrent CVA or TIAs or CNS events.  Marland Kitchen PAD (peripheral artery disease)     Stable. Had a past RFPBG by Dr. [name] in 2009. this graft is occluded with reconstitution in the popliteal artery, which was patent. Right tibial was occluded and 2-vessel runoff on angiography done by  [name] in 12/2010.  . Sick sinus syndrome     PPM implanted for this and syncope   . Chronic systolic CHF (congestive heart failure)     Hospitalization for CHF from 05/04/13-05/06/13. Treated medically. Had possible pneumonia with short course of antibiotics.  . Abdominal bruit     Loud  . AAA (abdominal aortic aneurysm)     Has a known AAA of 3.2 x 3.4 cm by abdominal untrasound 03/27/12. Followup abdominal ultrasound on 03/29/13 showed a diameter of 3.6 cm, which is stable  . Peripheral vascular disease   . Hypothyroid     On supplement "don't know if I take RX or not for my thyroid" (01/16/2014)  . Stroke    Past Surgical History  Procedure Laterality Date  . Femoral bypass Right ?2001  . Carotid stent Left 06/23/11    By [name]  . Coronary artery bypass graft  2000    1st in 2000 (Dr. Barry Dienes) x 4, LIMA to LAD, SVG to diagonal, SVG to OM4, SVG to PDA. Re-cath in May 2012 with patent LIMA to LAD, patent SVG to diagonal, patent SVG to OM with left-to-right collaterals to an occluded right.  . Coronary angioplasty with stent placement  2001-01/16/2014    "think today makes #4" (01/16/2014)  . Cardiac catheterization  2000  . Inguinal hernia repair Right 1980's  . Insert / replace / remove pacemaker      implanted  2003 by Dr [name] with gen change (MDT [id number]) [facility]  . Cataract extraction w/ intraocular lens  implant, bilateral Bilateral   . Abdominal aortagram N/A 02/22/2012    Procedure: ABDOMINAL AORTAGRAM;  Surgeon: Nada LibmanVance W Brabham, MD;  [facility];  Service: Cardiovascular;  Laterality: N/A;  . Carotid angiogram Bilateral 02/22/2012    Procedure: CAROTID ANGIOGRAM;  Surgeon: Nada LibmanVance W Brabham, MD;  [facility];  Service: Cardiovascular;  Laterality: Bilateral;  . Left heart catheterization with coronary/graft angiogram N/A 01/16/2014    Procedure: LEFT HEART CATHETERIZATION WITH Isabel CapriceORONARY/GRAFT ANGIOGRAM;  Surgeon: Kathleene Hazelhristopher D McAlhany, MD;  [facility];  Service: Cardiovascular;  Laterality: N/A;      Medication List    STOP taking these medications        clopidogrel 75 MG tablet  Commonly known as:  PLAVIX      TAKE these medications        amLODipine 10 MG tablet  Commonly known as:  NORVASC  Take 0.5 tablets (5 mg total) by mouth daily after supper.     aspirin 81 MG EC tablet  - Take 1 tablet (81 mg total) by mouth daily. Swallow whole.  - DO NOT RESUME UNTIL 12/29 due to Hemorrhage in your brain.  Start taking on:  12/03/2014     atorvastatin 40 MG tablet  Commonly known as:  LIPITOR  Take 20 mg by mouth at bedtime.     feeding supplement (ENSURE COMPLETE) Liqd  Take 237 mLs by mouth 2 (two) times daily between meals.     furosemide 20 MG tablet  Commonly known as:  LASIX  Take 1 tablet (20 mg total) by mouth daily.     gabapentin 300 MG capsule  Commonly known as:  NEURONTIN  Take 300 mg by mouth 2 (two) times daily.     isosorbide mononitrate 60 MG 24 hr tablet  Commonly known as:  IMDUR  Take 1 tablet (60 mg total) by mouth at bedtime.     metoprolol 200 MG 24 hr tablet  Commonly known as:  TOPROL-XL  Take 0.5 tablets (100 mg total) by mouth every morning.     multivitamin with minerals tablet  Take 1 tablet by mouth daily.     nitroGLYCERIN 0.4 MG SL tablet  Commonly known as:  NITROSTAT  Place 1 tablet (0.4 mg total) under the tongue every 5 (five) minutes as needed. For chest pain     potassium chloride 10 MEQ tablet  Commonly known as:  K-DUR  Take 1 tablet (10 mEq total) by mouth daily.     sertraline 100 MG tablet  Commonly known as:  ZOLOFT  Take 50 mg by mouth at bedtime.     tamsulosin 0.4 MG Caps capsule  Commonly known as:  FLOMAX  Take 0.4 mg by mouth daily after supper.        LABORATORY STUDIES CBC    Component Value Date/Time   WBC 7.3 11/17/2014 1252   WBC 10.4* 03/31/2011 1429   RBC 4.50 11/17/2014 1252   RBC 4.57 03/31/2011 1429   HGB 14.5 11/17/2014  1252   HGB 14.8 03/31/2011 1429   HCT 42.4 11/17/2014 1252   HCT 44.5 03/31/2011 1429   PLT 136* 11/17/2014 1252   PLT 157 03/31/2011 1429   MCV 94.2 11/17/2014 1252   MCV 97.4 03/31/2011 1429   MCH 32.2 11/17/2014 1252   MCH 32.4 03/31/2011 1429   MCHC 34.2  11/17/2014 1252   MCHC 33.3 03/31/2011 1429   RDW 14.7 11/17/2014 1252   RDW 15.7* 03/31/2011 1429   LYMPHSABS 2.5 11/17/2014 1252   LYMPHSABS 5.4* 03/31/2011 1429   MONOABS 0.4 11/17/2014 1252   MONOABS 0.5 03/31/2011 1429   EOSABS 0.1 11/17/2014 1252   EOSABS 0.2 03/31/2011 1429   BASOSABS 0.1 11/17/2014 1252   BASOSABS 0.1 03/31/2011 1429   CMP    Component Value Date/Time   NA 142 11/17/2014 1252   K 3.5* 11/17/2014 1252   CL 105 11/17/2014 1252   CO2 27 11/17/2014 1252   GLUCOSE 125* 11/17/2014 1252   BUN 15 11/17/2014 1252   CREATININE 1.12 11/17/2014 1252   CREATININE 1.31 08/20/2013 0952   CALCIUM 8.5 11/17/2014 1252   PROT 6.0 11/18/2014 1000   ALBUMIN 2.6* 11/18/2014 1000   AST 14 11/18/2014 1000   ALT 9 11/18/2014 1000   ALKPHOS 88 11/18/2014 1000   BILITOT 0.5 11/18/2014 1000   GFRNONAA 58* 11/17/2014 1252   GFRAA 67* 11/17/2014 1252   COAGS Lab Results  Component Value Date   INR 1.1* 01/15/2014   INR 1.19 12/30/2013   INR 1.08 02/22/2012   Lipid Panel    Component Value Date/Time   CHOL 101 07/06/2011 0635   TRIG 61 07/06/2011 0635   HDL 39* 07/06/2011 0635   CHOLHDL 2.6 07/06/2011 0635   VLDL 12 07/06/2011 0635   LDLCALC 50 07/06/2011 0635   HgbA1C  Lab Results  Component Value Date   HGBA1C 6.1* 07/05/2011   Cardiac Panel (last 3 results) No results for input(s): CKTOTAL, CKMB, TROPONINI, RELINDX in the last 72 hours. Urinalysis    Component Value Date/Time   COLORURINE YELLOW 01/02/2014 0815   APPEARANCEUR CLOUDY* 01/02/2014 0815   LABSPEC 1.023 01/02/2014 0815   PHURINE 5.0 01/02/2014 0815   GLUCOSEU NEGATIVE 01/02/2014 0815   HGBUR NEGATIVE 01/02/2014 0815    BILIRUBINUR NEGATIVE 01/02/2014 0815   KETONESUR NEGATIVE 01/02/2014 0815   PROTEINUR NEGATIVE 01/02/2014 0815   UROBILINOGEN 0.2 01/02/2014 0815   NITRITE NEGATIVE 01/02/2014 0815   LEUKOCYTESUR NEGATIVE 01/02/2014 0815   Urine Drug Screen     Component Value Date/Time   LABOPIA NEGATIVE 06/18/2011 0403   COCAINSCRNUR NEGATIVE 06/18/2011 0403   LABBENZ NEGATIVE 06/18/2011 0403   AMPHETMU NEGATIVE 06/18/2011 0403    Alcohol Level No results found for: ETH   SIGNIFICANT DIAGNOSTIC STUDIES Ct Head Wo Contrast 11/17/2014   1. Small focus of intraparenchymal hemorrhage in the left centrum semiovale measuring 9.5 mm. 2. Little if any mass effect or surrounding edema. 3. Moderate atrophy and small vessel disease. 4.   Ct Angio Head & Neck W/cm &/or Wo/cm 11/18/2014   1. Stable 10 mm hyperdense lesion in the high left parietal lobe with surrounding vasogenic edema likely accounts for the patient's symptoms. 2. Moderate stenosis in the distal right M1 segment. 3. Moderate stenosis of the cavernous right ICA just proximal to the anterior genu. 4. Advanced atherosclerotic changes at the aortic arch without focal stenosis or aneurysm. 5. Extensive atherosclerotic changes within the right common carotid artery, at the bifurcation, and in the distal right ICA without a significant stenosis. 6. 50% stenosis of the proximal left common carotid artery. 7. Patent left carotid bifurcations stent. 8. 15 mm right peritracheal lymph node. While this may be reactive, neoplasm is not excluded. 9. Bilateral pleural effusions. 10. Emphysema.     Dg Chest Port 1 View 11/19/2014   No acute abnormality  noted.  No change from the prior exam.    11/17/2014    No acute cardiopulmonary disease     2D Echocardiogram EF 45% with no source of embolus. Diffuse hypokinesis.     HISTORY OF PRESENT ILLNESS [name] is an 80 y.o. male history of hypertension, coronary artery disease, TIA, hyperlipidemia, COPD, stroke  and AAA, presenting with new onset numbness involving right arm and leg as well as continued pain involving his right leg. He's had pain involving the leg off and on for the past 3 weeks which seems to be related to activity. Numbness is of new onset and occurred overnight. He was last known well at bedtime at 9 PM tonight 11/16/2014. CT scan of his head showed a small intraparenchymal hemorrhage involving the left centrum semi-ovale. NIH stroke score was 2. He has been taking aspirin and Plavix daily. Patient also has a cardiac pacemaker. Patient was not administered TPA secondary to hemorrhage. He was admitted to the neuro ICU for further evaluation and treatment.    HOSPITAL COURSE Mr. Louis HimRaymond Meroney is a 80 y.o. male with history of hypertension, coronary artery disease, TIA, hyperlipidemia, COPD, stroke and AAA presenting with right arm and leg numbness as well as right lower extremity pain. He did not receive IV t-PA due to hemorrhage.   Stroke: Dominant left centrum semi-ovale hemorrhage at the grey/white junction, most likely secondary to hypertension, though given age and location, concern for amyloid or metastatic disease  Resultant Right side numbness, no weakness  MRI / MRA Pacer  CT angio head and Neck stable hemorrhage. mild intracranial atherosclerosis. ICA with atherosclerotic changes. Patent left carotid bifurcation stent. 15 mm right peritracheal lymph node. Bilateral pleural effusions. Emphysema.   2D Echo No source of embolus   aspirin 81 mg orally every day and clopidogrel 75 mg orally every day prior to admission. Given cardiac stent in February 2015, will resume aspirin alone in 2 weeks; hemorrhage should have resolved by that time. Concern for resuming dual antiplatelets given hemorrhage. Plavix has been discontinued  Ongoing aggressive stroke risk factor management  Therapy recommendations: No PT, no OT  Disposition: D/c home (patient independent, lives alone.  Family lives nearby and can check on him daily.)  Enlarged Peritracheal Lymph node  Pulmonary consulted  Lymph node likely nonspecific  Plan follow CT up in 3 months. If enlarging at that time, consider biopsy.  Concern greater given suspicion for possible metastatic source of hemorrhage  Small bilateral pleural effusion  Pulmonary Consult reviewed  Appears loculated on CT  Effusions presumed due to CHF.  Chronic systolic CHF, on home lasix. Home Lasix resumed  Can perform spirometry as an outpatient  Hypertension  BP slightly elevated just after arrival at 179/80  Home meds: Norvasc, imdur, toprol  Placed on low dose Cardene during first night after getting frequent labetalol injections to keep < 160.  Systolic blood pressure goal <160  24 hours, then increased to SBP < 180   Prn labetalol post Cardene  Stable at time of discharge  Hyperlipidemia  Home meds: lipitor 40 resumed in hospital  Intolerant to pravachol and simvastatin   LDL not drawn  Continue statin at discharge  Other Stroke Risk Factors  Advanced age  Cigarette smoker, advised to stop smoking  Hx stroke/TIA  Coronary artery disease - PTCA, stent February 2015, CABG  Carotid stenosis w/p L ICA stent 7/12 (Brabham)  Other Active Problems  COPD  PAD  SSS, has pacer  AAA  PVD - fem bypass  Hypothyroid  Emphysema    DISCHARGE EXAM Blood pressure 147/68, pulse 73, temperature 98.5 F (36.9 C), temperature source Oral, resp. rate 15, height 5\' 9"  (1.753 m), weight 63.9 kg (140 lb 14 oz), SpO2 100 %. Frail elderly african [place] male not in distress.Awake alert. Afebrile. Head is nontraumatic. Neck is supple without bruit. Hearing is normal. Cardiac exam no murmur or gallop. Lungs are clear to auscultation. Distal pulses are well felt. Neurological Exam ;  Awake Alert oriented x 3. Normal speech and language.eye movements full without nystagmus.fundi were not  visualized. Vision acuity and fields appear normal. Hearing is normal. Palatal movements are normal. Face symmetric. Tongue midline. Normal strength, tone, reflexes and coordination. Normal sensation. Gait deferred.   Discharge Diet   Diet Heart thin liquids   DISCHARGE PLAN  Disposition:  Home alone   Resume aspirin 81 mg orally every day for secondary stroke prevention in 2 weeks on 12/04/2014  Follow-up BURNETT,BRENT A, MD in 2 weeks.  Follow up Tammy Parrett  12/31/2014 at 10:30  Follow-up with Dr. Delia Heady, Stroke Clinic in 2 month.  40 minutes were spent preparing discharge.  Rhoderick Moody [facility] See Amion for Pager information 11/19/2014 12:18 PM   I have personally examined this patient, reviewed notes, independently viewed imaging studies, participated in medical decision making and plan of care. I have made any additions or clarifications directly to the above note. Agree with note above.   Delia Heady, MD Medical Director [facility] Pager: [phone] 11/19/2014 4:46 PM

## 2014-11-19 NOTE — Progress Notes (Signed)
Pt admitted to 4N20 from 13M at this time.  Pt A&O; no complaints of pain; VSS.  Sitting on side of bed.  Alarm set.  Will continue to monitor.

## 2014-11-19 NOTE — Consult Note (Addendum)
Name: [name] MRN: [id number] DOB: 08/02/1927    ADMISSION DATE:  11/17/2014 CONSULTATION DATE:  11/19/14  REFERRING MD :  Dr. Pearlean Brownie   CHIEF COMPLAINT:  Abnormal CT Chest   BRIEF PATIENT DESCRIPTION: 80 y/o M admitted 12/13 with R arm/leg numbness and found to have a small intraparenchymal hemorrhage of L centrum semi-ovale.  Admitted to ICU for observation.  Follow up imaging noted pleural effusions & enlarged R Paratracheal node.  PCCM consulted for evaluation.   SIGNIFICANT EVENTS  12/13  Admit with R sided numbness, work up positive for small intraparenchymal hemorrhage of L centrum semi-ovale.  STUDIES:  12/13 CT Head >> of intraparenchymal hemorrhage in the left centrum semi-ovale, 9.5 mm 12/14 CTA Head / Neck >> stable lesion in left parietal lobe with surrounding vasogenic edema, 15 mm right paratracheal lymph node, small bilateral pleural effusions, emphysema    HISTORY OF PRESENT ILLNESS:  80 y/o M wih a PMH of hypertension, coronary artery disease, hyperlipidemia, prior CVA/TIA, peripheral artery disease, chronic systolic CHF, pacemaker, hypothyroidism, AAA and sick sinus syndrome who presented to the [facility] ER on 12/13 with complaints of waking with right arm and leg numbness.  The patient reported on admission he had pain involving the left leg on and off for 3 weeks prior to resuscitation which he thought was related to activity. CT of the head was evaluated which was positive for an intraparenchymal hemorrhage in the left centrum semi-ovale.  He was admitted to the neuro intensive care unit as a code stroke. Subsequent imaging of the head & neck on 12/14 demonstrated a stable lesion in the left parietal lobe with surrounding edema and incidental finding of a 15 mm right paratracheal lymph node, emphysema and small bilateral pleural effusions.  PCCM consulted for further evaluation.  At baseline the patient lives alone. He is a current smoker 1 pack per day since age  80. Currently he denies respiratory symptoms to include cough, sputum production, chest pain, shortness of breath, fevers/chills, and hemoptysis.   PAST MEDICAL HISTORY :   has a past medical history of Hypertension; CAD (coronary artery disease); TIA (transient ischemic attack); Hyperlipidemia; COPD (chronic obstructive pulmonary disease); CVA (cerebral infarction) (07/2011); PAD (peripheral artery disease); Sick sinus syndrome; Chronic systolic CHF (congestive heart failure); Abdominal bruit; AAA (abdominal aortic aneurysm); Peripheral vascular disease; Hypothyroid; and Stroke.  has past surgical history that includes Femoral bypass (Right, ?2001); Carotid stent (Left, 06/23/11); Coronary artery bypass graft (2000); Coronary angioplasty with stent (2001-01/16/2014); Cardiac catheterization (2000); Inguinal hernia repair (Right, 1980's); Insert / replace / remove pacemaker; Cataract extraction w/ intraocular lens  implant, bilateral (Bilateral); abdominal aortagram (N/A, 02/22/2012); carotid angiogram (Bilateral, 02/22/2012); and left heart catheterization with coronary/graft angiogram (N/A, 01/16/2014).    HOME MEDICATIONS:  Prior to Admission medications   Medication Sig Start Date End Date Taking? Authorizing Provider  amLODipine (NORVASC) 10 MG tablet Take 0.5 tablets (5 mg total) by mouth daily after supper. 07/16/14  Yes Lars Masson, MD  aspirin 81 MG EC tablet TAKE 1 TABLET (81 MG TOTAL) BY MOUTH DAILY. 02/26/14  Yes Lars Masson, MD  atorvastatin (LIPITOR) 40 MG tablet Take 20 mg by mouth at bedtime.   Yes Historical Provider, MD  clopidogrel (PLAVIX) 75 MG tablet Take 1 tablet (75 mg total) by mouth daily with breakfast. 01/17/14  Yes Ok Anis, NP  feeding supplement, ENSURE COMPLETE, (ENSURE COMPLETE) LIQD Take 237 mLs by mouth 2 (two) times daily between meals. 01/05/14  Yes Renae Fickle, MD  furosemide (LASIX) 20 MG tablet Take 1 tablet (20 mg total) by mouth daily.  04/15/14  Yes Lars Masson, MD  gabapentin (NEURONTIN) 300 MG capsule Take 300 mg by mouth 2 (two) times daily.   Yes Historical Provider, MD  isosorbide mononitrate (IMDUR) 60 MG 24 hr tablet Take 1 tablet (60 mg total) by mouth at bedtime. 07/16/14  Yes Lars Masson, MD  metoprolol (TOPROL-XL) 200 MG 24 hr tablet Take 0.5 tablets (100 mg total) by mouth every morning. 07/16/14  Yes Lars Masson, MD  Multiple Vitamins-Minerals (MULTIVITAMIN WITH MINERALS) tablet Take 1 tablet by mouth daily.   Yes Historical Provider, MD  nitroGLYCERIN (NITROSTAT) 0.4 MG SL tablet Place 1 tablet (0.4 mg total) under the tongue every 5 (five) minutes as needed. For chest pain 06/11/14  Yes Hollice Espy, MD  potassium chloride (K-DUR) 10 MEQ tablet Take 1 tablet (10 mEq total) by mouth daily. 07/17/14  Yes Lars Masson, MD  sertraline (ZOLOFT) 100 MG tablet Take 50 mg by mouth at bedtime.    Yes Historical Provider, MD  Tamsulosin HCl (FLOMAX) 0.4 MG CAPS Take 0.4 mg by mouth daily after supper.    Yes Historical Provider, MD   Allergies  Allergen Reactions  . Pravachol     myalgias  . Simvastatin     myalgia  . Terazosin     FAMILY HISTORY:  family history includes Cancer (age of onset: 83) in his brother; Hyperlipidemia in his brother; Hypertension in his brother; Lung disease in his father; Prostate cancer in his brother.   SOCIAL HISTORY:  reports that he has been smoking Cigarettes.  He has a 34 pack-year smoking history. He quit smokeless tobacco use about 3 years ago. He reports that he does not drink alcohol or use illicit drugs.  REVIEW OF SYSTEMS:   Constitutional: Negative for fever, chills, weight loss, malaise/fatigue and diaphoresis.  HENT: Negative for hearing loss, ear pain, nosebleeds, congestion, sore throat, neck pain, tinnitus and ear discharge.   Eyes: Negative for blurred vision, double vision, photophobia, pain, discharge and redness.  Respiratory: Negative for  cough, hemoptysis, sputum production, shortness of breath, wheezing and stridor.   Cardiovascular: Negative for chest pain, palpitations, orthopnea, claudication, leg swelling and PND.  Gastrointestinal: Negative for heartburn, nausea, vomiting, abdominal pain, diarrhea, constipation, blood in stool and melena.  Genitourinary: Negative for dysuria, urgency, frequency, hematuria and flank pain.  Musculoskeletal: Negative for myalgias, back pain, joint pain and falls.  Skin: Negative for itching and rash.  Neurological: Negative for dizziness, tingling, tremors, sensory change, speech change, focal weakness, seizures, loss of consciousness, weakness and headaches.  Endo/Heme/Allergies: Negative for environmental allergies and polydipsia. Does not bruise/bleed easily.  SUBJECTIVE: Patient denies current complaints.  VITAL SIGNS: Temp:  [97.8 F (36.6 C)-98.5 F (36.9 C)] 98.5 F (36.9 C) (12/15 0803) Pulse Rate:  [69-84] 72 (12/15 0800) Resp:  [12-24] 16 (12/15 0800) BP: (109-177)/(51-140) 153/55 mmHg (12/15 0800) SpO2:  [90 %-100 %] 100 % (12/15 0800)  PHYSICAL EXAMINATION: General:  Elderly male in no acute distress Neuro:  AAO 4, moving all extremities, equal  Bilaterally, normal strength and tone HEENT:  MM pink and moist, no JVD Cardiovascular:  s1s2 rrr, pacer noted Lungs:  Respirations even/nonlabored, lungs bilaterally clear Abdomen: NT ND, BS 4 active Musculoskeletal:  No acute deformities Skin:  Warm and dry   Recent Labs Lab 11/17/14 1252  NA 142  K 3.5*  CL  105  CO2 27  BUN 15  CREATININE 1.12  GLUCOSE 125*    Recent Labs Lab 11/17/14 1252  HGB 14.5  HCT 42.4  WBC 7.3  PLT 136*   Ct Angio Head W/cm &/or Wo Cm  11/18/2014   CLINICAL DATA:  Numbness to the right hand. Pain and numbness throughout the right lower extremity.  EXAM: CT ANGIOGRAPHY HEAD AND NECK  TECHNIQUE: Multidetector CT imaging of the head and neck was performed using the standard  protocol during bolus administration of intravenous contrast. Multiplanar CT image reconstructions and MIPs were obtained to evaluate the vascular anatomy. Carotid stenosis measurements (when applicable) are obtained utilizing NASCET criteria, using the distal internal carotid diameter as the denominator.  CONTRAST:  50mL OMNIPAQUE IOHEXOL 350 MG/ML SOLN  COMPARISON:  CT without head 11/17/2014.  FINDINGS: CTA HEAD FINDINGS  A 10 mm high left parietal hyperdense lesion is again noted. No other focal mass lesions are present. There is no pathologic enhancement. Note is made of mild surrounding edema. A branch vessel crosses pass the lesion. This does not appear to be a vascular lesion. Periventricular and subcortical white matter hypoattenuation is present bilaterally and addition.  Atherosclerotic calcifications are present within the cavernous internal carotid arteries bilaterally. There is moderate narrowing of the right cavernous internal carotid artery just proximal to the anterior genu relative to the more distal vessels. The terminal ICA is normal bilaterally. The left A1 segment is dominant. There is moderate narrowing of the distal right M1 segment. The bifurcations are intact. There is moderate attenuation of distal MCA branch vessels bilaterally, worse on the right.  The left vertebral artery is slightly dominant to the right. The PICA origins are below the dural margin. Both posterior cerebral arteries originate from the basilar tip. Posterior communicating arteries are patent bilaterally.  No postcontrast enhancement is present. The dural sinuses are patent. Note is made of a a lipoma in the high left parietal scalp.  Review of the MIP images confirms the above findings.  CTA NECK FINDINGS  Marked atherosclerotic irregularity is present at the aortic arch. A 3 vessel arch configuration is present. Atherosclerotic changes are present without focal stenosis.  A high-grade stenosis is present at the origin  of the right vertebral artery. Dense calcifications with a moderate left vertebral artery stenosis are present. The left vertebral artery is slightly dominant to the right throughout the neck without additional stenoses. Atherosclerotic calcifications are present at the dural margin on the right. The PICA origins are below the dural margin.  The right common carotid artery demonstrates scattered atherosclerotic changes without a significant stenosis relative to the more distal vessels. Additional atherosclerotic changes are present in the proximal right internal carotid artery and at the bifurcation without a significant stenosis relative to the more distal vessel. There is focal irregularity of the distal right ICA just below the skullbase. There is no significant stenosis and any of these lesions relative to the more distal vessel.  The left common carotid artery demonstrates a 50% stenosis proximally. The lumen is patent through the carotid bifurcations stent. Minimal atherosclerotic changes are present beyond the stent without a significant stool cyst.  The thyroid is heterogeneous without a discrete lesion. Small moderate bilateral pleural effusions are noted. Emphysema is present. A 15 mm right peritracheal lymph node is evident.  Multilevel endplate degenerative changes are noted in the cervical spine from C3-4 through C7-T1. No focal lytic or blastic lesions are present.  Review of the MIP images  confirms the above findings.  IMPRESSION: 1. Stable 10 mm hyperdense lesion in the high left parietal lobe with surrounding vasogenic edema likely accounts for the patient's symptoms. 2. Moderate stenosis in the distal right M1 segment. 3. Moderate stenosis of the cavernous right ICA just proximal to the anterior genu. 4. Advanced atherosclerotic changes at the aortic arch without focal stenosis or aneurysm. 5. Extensive atherosclerotic changes within the right common carotid artery, at the bifurcation, and in the  distal right ICA without a significant stenosis. 6. 50% stenosis of the proximal left common carotid artery. 7. Patent left carotid bifurcations stent. 8. 15 mm right peritracheal lymph node. While this may be reactive, neoplasm is not excluded. 9. Bilateral pleural effusions. 10. Emphysema.   Electronically Signed   By: Gennette Pachris  Mattern M.D.   On: 11/18/2014 11:11   Ct Head Wo Contrast  11/17/2014   CLINICAL DATA:  Right-sided weakness. No history of stroke. History of hypertension.  EXAM: CT HEAD WITHOUT CONTRAST  TECHNIQUE: Contiguous axial images were obtained from the base of the skull through the vertex without intravenous contrast.  COMPARISON:  12/30/2013  FINDINGS: There is moderate central and cortical atrophy. Periventricular white matter changes are consistent with small vessel disease.  Within the left centrum semiovale there is a small rounded focus of hemorrhage measuring 9.5 mm. There is little if any surrounding edema or mass effect. No acute infarct. No intracranial mass.  Bone windows are unremarkable. There is moderate atherosclerotic calcification of the internal carotid arteries.  IMPRESSION: 1. Small focus of intraparenchymal hemorrhage in the left centrum semiovale measuring 9.5 mm. 2. Little if any mass effect or surrounding edema. 3. Moderate atrophy and small vessel disease. 4. Critical Value/emergent results were called by telephone at the time of interpretation on 11/17/2014 at 1:33 pm to Dr. Raeford RazorSTEPHEN KOHUT , who verbally acknowledged these results.   Electronically Signed   By: Rosalie GumsBeth  Brown M.D.   On: 11/17/2014 13:35   Ct Angio Neck W/cm &/or Wo/cm  11/18/2014   CLINICAL DATA:  Numbness to the right hand. Pain and numbness throughout the right lower extremity.  EXAM: CT ANGIOGRAPHY HEAD AND NECK  TECHNIQUE: Multidetector CT imaging of the head and neck was performed using the standard protocol during bolus administration of intravenous contrast. Multiplanar CT image reconstructions  and MIPs were obtained to evaluate the vascular anatomy. Carotid stenosis measurements (when applicable) are obtained utilizing NASCET criteria, using the distal internal carotid diameter as the denominator.  CONTRAST:  50mL OMNIPAQUE IOHEXOL 350 MG/ML SOLN  COMPARISON:  CT without head 11/17/2014.  FINDINGS: CTA HEAD FINDINGS  A 10 mm high left parietal hyperdense lesion is again noted. No other focal mass lesions are present. There is no pathologic enhancement. Note is made of mild surrounding edema. A branch vessel crosses pass the lesion. This does not appear to be a vascular lesion. Periventricular and subcortical white matter hypoattenuation is present bilaterally and addition.  Atherosclerotic calcifications are present within the cavernous internal carotid arteries bilaterally. There is moderate narrowing of the right cavernous internal carotid artery just proximal to the anterior genu relative to the more distal vessels. The terminal ICA is normal bilaterally. The left A1 segment is dominant. There is moderate narrowing of the distal right M1 segment. The bifurcations are intact. There is moderate attenuation of distal MCA branch vessels bilaterally, worse on the right.  The left vertebral artery is slightly dominant to the right. The PICA origins are below the dural margin. Both  posterior cerebral arteries originate from the basilar tip. Posterior communicating arteries are patent bilaterally.  No postcontrast enhancement is present. The dural sinuses are patent. Note is made of a a lipoma in the high left parietal scalp.  Review of the MIP images confirms the above findings.  CTA NECK FINDINGS  Marked atherosclerotic irregularity is present at the aortic arch. A 3 vessel arch configuration is present. Atherosclerotic changes are present without focal stenosis.  A high-grade stenosis is present at the origin of the right vertebral artery. Dense calcifications with a moderate left vertebral artery stenosis  are present. The left vertebral artery is slightly dominant to the right throughout the neck without additional stenoses. Atherosclerotic calcifications are present at the dural margin on the right. The PICA origins are below the dural margin.  The right common carotid artery demonstrates scattered atherosclerotic changes without a significant stenosis relative to the more distal vessels. Additional atherosclerotic changes are present in the proximal right internal carotid artery and at the bifurcation without a significant stenosis relative to the more distal vessel. There is focal irregularity of the distal right ICA just below the skullbase. There is no significant stenosis and any of these lesions relative to the more distal vessel.  The left common carotid artery demonstrates a 50% stenosis proximally. The lumen is patent through the carotid bifurcations stent. Minimal atherosclerotic changes are present beyond the stent without a significant stool cyst.  The thyroid is heterogeneous without a discrete lesion. Small moderate bilateral pleural effusions are noted. Emphysema is present. A 15 mm right peritracheal lymph node is evident.  Multilevel endplate degenerative changes are noted in the cervical spine from C3-4 through C7-T1. No focal lytic or blastic lesions are present.  Review of the MIP images confirms the above findings.  IMPRESSION: 1. Stable 10 mm hyperdense lesion in the high left parietal lobe with surrounding vasogenic edema likely accounts for the patient's symptoms. 2. Moderate stenosis in the distal right M1 segment. 3. Moderate stenosis of the cavernous right ICA just proximal to the anterior genu. 4. Advanced atherosclerotic changes at the aortic arch without focal stenosis or aneurysm. 5. Extensive atherosclerotic changes within the right common carotid artery, at the bifurcation, and in the distal right ICA without a significant stenosis. 6. 50% stenosis of the proximal left common carotid  artery. 7. Patent left carotid bifurcations stent. 8. 15 mm right peritracheal lymph node. While this may be reactive, neoplasm is not excluded. 9. Bilateral pleural effusions. 10. Emphysema.   Electronically Signed   By: Gennette Pachris  Mattern M.D.   On: 11/18/2014 11:11   Chest Port 1 View  11/17/2014   CLINICAL DATA:  Routine adult health maintenance; R leg pain; lump in mid upper leg with throbbing pain and feet feel cold and stingingHx: HTN, smoker 1/2 pk/day; pacemaker 2000, open heart 4 bypass 2001  EXAM: PORTABLE CHEST - 1 VIEW  COMPARISON:  06/09/2014  FINDINGS: Stable changes from prior CABG surgery. Cardiac silhouette is mildly enlarged. Aorta is uncoiled. No mediastinal or hilar masses. There is interstitial thickening most evident in the lung bases. Decreased lung markings is noted in the upper lobes suggesting emphysema. No lung consolidation or edema. No pleural effusion or pneumothorax.  Left anterior chest wall sequential pacemaker is stable with its leads projecting in the right atrium and right ventricle.  Bony thorax is demineralized but grossly intact.  IMPRESSION: No acute cardiopulmonary disease   Electronically Signed   By: Amie Portlandavid  Ormond M.D.   On:  11/17/2014 16:54    ASSESSMENT / PLAN:   Enlarged R Peritracheal Lymph Node Small Bilateral Pleural Effusion - appears partially loculated on L  Plan: Assess upright CXR to evaluate effusions to determine if free flowing Not likely enough fluid based on CT to perform thora and patient is asymptomatic, recommend monitoring for now R peritracheal node likely non-specific   CVA   Plan:  Per Neuro SVC   Attending to follow.  Canary Brim, NP-C [facility] Pgr: [phone] or [phone]  ATTENDING NOTE: I have personally reviewed patient's available data, including medical history, events of note, physical examination and test results as part of my evaluation. I have discussed with resident/NP and other careteam  providers such as pharmacist, RN and RRT & co-ordinated with consultants. In addition, I personally evaluated patient and elicited key history of RUE/RLE weakness,small bleed on plavix, h/o bare metal stent in 01/2014, active smoker 1/2 PPD -about 30 Pyrs exam findings of clear lungs & labs showing low plts. CT chest reviewed - high rt paratracheal LN (no priors for comparison) , small BL effusions  Suggest rpt CT chest with contrast in 3 mnths - if persistent may need EBUS Check BNP but presume effusions related to [facility] -resume lasix Plan d/w pt , FU appt made for jan 26 - spirometry can be performed as outpt - CT chst does show emphysema Rest per NP/medical resident whose note is outlined above and that I agree with and edited in full.   Oretha Milch MD 11/19/2014, 9:48 AM

## 2014-12-31 ENCOUNTER — Inpatient Hospital Stay: Payer: Medicare Other | Admitting: Adult Health

## 2015-01-21 ENCOUNTER — Encounter: Payer: Self-pay | Admitting: *Deleted

## 2015-01-29 ENCOUNTER — Encounter: Payer: Self-pay | Admitting: Neurology

## 2015-01-29 ENCOUNTER — Ambulatory Visit (INDEPENDENT_AMBULATORY_CARE_PROVIDER_SITE_OTHER): Payer: Medicare Other | Admitting: Neurology

## 2015-01-29 VITALS — BP 127/70 | HR 78 | Ht 69.0 in | Wt 145.4 lb

## 2015-01-29 DIAGNOSIS — E785 Hyperlipidemia, unspecified: Secondary | ICD-10-CM | POA: Diagnosis not present

## 2015-01-29 DIAGNOSIS — I10 Essential (primary) hypertension: Secondary | ICD-10-CM

## 2015-01-29 DIAGNOSIS — I951 Orthostatic hypotension: Secondary | ICD-10-CM

## 2015-01-29 DIAGNOSIS — I739 Peripheral vascular disease, unspecified: Secondary | ICD-10-CM | POA: Diagnosis not present

## 2015-01-29 DIAGNOSIS — I25119 Atherosclerotic heart disease of native coronary artery with unspecified angina pectoris: Secondary | ICD-10-CM

## 2015-01-29 DIAGNOSIS — Z950 Presence of cardiac pacemaker: Secondary | ICD-10-CM

## 2015-01-29 DIAGNOSIS — I611 Nontraumatic intracerebral hemorrhage in hemisphere, cortical: Secondary | ICD-10-CM

## 2015-01-29 NOTE — Patient Instructions (Signed)
-   please contact [facility] to repeat CT head to see the resolution of brain bleed - change metoprolol to 50 mg once a day = 1/4 of the tablets every day - continue other home medications including ASA 81 and plavix - check BP at home, BP goal 120-140 - Follow up with your [facility] doctor for stroke risk factor modification. Recommend maintain blood pressure goal <130/80, diabetes with hemoglobin A1c goal below 6.5% and lipids with LDL cholesterol goal below 70 mg/dL.  - follow up in 3 months

## 2015-01-31 DIAGNOSIS — I25119 Atherosclerotic heart disease of native coronary artery with unspecified angina pectoris: Secondary | ICD-10-CM | POA: Insufficient documentation

## 2015-01-31 DIAGNOSIS — I10 Essential (primary) hypertension: Secondary | ICD-10-CM | POA: Insufficient documentation

## 2015-01-31 DIAGNOSIS — Z950 Presence of cardiac pacemaker: Secondary | ICD-10-CM | POA: Insufficient documentation

## 2015-01-31 DIAGNOSIS — I739 Peripheral vascular disease, unspecified: Secondary | ICD-10-CM | POA: Insufficient documentation

## 2015-01-31 DIAGNOSIS — I611 Nontraumatic intracerebral hemorrhage in hemisphere, cortical: Secondary | ICD-10-CM | POA: Insufficient documentation

## 2015-01-31 DIAGNOSIS — I951 Orthostatic hypotension: Secondary | ICD-10-CM | POA: Insufficient documentation

## 2015-01-31 DIAGNOSIS — E785 Hyperlipidemia, unspecified: Secondary | ICD-10-CM | POA: Insufficient documentation

## 2015-01-31 NOTE — Progress Notes (Signed)
STROKE NEUROLOGY FOLLOW UP NOTE  NAME: [name] DOB: [birth year]  REASON FOR VISIT: stroke follow up HISTORY FROM: pt and chart  Today we had the pleasure of seeing [name] in follow-up at our Neurology Clinic. Pt was accompanied by grandson.   History Summary [name] is an 81 y.o. 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 CT head showed a small intraparenchymal hemorrhage involving the left centrum semi-ovale. NIH stroke score was 2. He has been taking aspirin and Plavix daily. He was admitted to the neuro ICU for further evaluation and treatment. His plavix discontinued and ASA was resumed in 2 weeks given his extensive hx of CAD and last stent was 01/2014. His BP was high on admission, initially on cardene drip and then stabilized on po meds. He was discharged to home in good condition.   Interval History During the interval time, the patient has been doing well. No recurrent stroke symptoms. No headache. He has not have repeat CT yet. He has been put back on ASA and plavix by his [place] physician. He stated that he has hx of orthostatic hypotension, he stands up and then BP drops and he fell. For the last 3 months, he fell 10 times, last fall was two weeks ago. Today in Clinic BP 134/75.    REVIEW OF SYSTEMS: Full 14 system review of systems performed and notable only for those listed below and in HPI above, all others are negative:  Constitutional:   Cardiovascular:  Ear/Nose/Throat:   Skin:  Eyes:   Respiratory:   Gastroitestinal:   Genitourinary:  Hematology/Lymphatic:   Endocrine:  Musculoskeletal:   Allergy/Immunology:   Neurological:   Psychiatric:  Sleep:   The following represents the patient's updated allergies and side effects list: Allergies    Allergen Reactions  . Pravachol     myalgias  . Simvastatin     myalgia  . Terazosin     The neurologically relevant items on the patient's problem list were reviewed on today's visit.  Neurologic Examination  A problem focused neurological exam (12 or more points of the single system neurologic examination, vital signs counts as 1 point, cranial nerves count for 8 points) was performed.  Blood pressure 127/70, pulse 78, height  (1.753 m), weight 145 lb 6.4 oz (65.953 kg).   Lying 133/74 - 78, sitting 142/73 - 78, standing 127/70 - 78  General - Well nourished, well developed, in no apparent distress.  Ophthalmologic - fundi not visualized due to cataracts.  Cardiovascular - Regular rate and rhythm.  Mental Status -  Level of arousal and orientation to time, place, and person were intact. Language including expression, naming, repetition, comprehension was assessed and found intact.  Cranial Nerves II - XII - II - Visual field intact OU. III, IV, VI - Extraocular movements intact. V - Facial sensation intact bilaterally. VII - Facial movement intact bilaterally. VIII - Hearing & vestibular intact bilaterally. X - Palate elevates symmetrically. XI - Chin turning & shoulder shrug intact bilaterally. XII - Tongue protrusion intact.  Motor Strength - The patient's strength was normal 5-/5 in all extremities and pronator drift was absent.  Bulk was normal and fasciculations were absent.   Motor Tone - Muscle tone was assessed at the neck and  appendages and was normal.  Reflexes - The patient's reflexes were 1+ in all extremities and he had no pathological reflexes.  Sensory - Light touch, temperature/pinprick were assessed and were normal except right hand especially fingers and medial pulm.    Coordination - The patient had normal movements in the hands with no ataxia or dysmetria.  Tremor was absent.  Gait and Station - walk with cane, mild stooped posturing, slow  and small gait, steady, no fall.  Data reviewed: I personally reviewed the images and agree with the radiology interpretations.  Ct Head Wo Contrast 11/17/2014 1. Small focus of intraparenchymal hemorrhage in the left centrum semiovale measuring 9.5 mm. 2. Little if any mass effect or surrounding edema. 3. Moderate atrophy and small vessel disease. 4.   Ct Angio Head & Neck W/cm &/or Wo/cm 11/18/2014 1. Stable 10 mm hyperdense lesion in the high left parietal lobe with surrounding vasogenic edema likely accounts for the patient's symptoms. 2. Moderate stenosis in the distal right M1 segment. 3. Moderate stenosis of the cavernous right ICA just proximal to the anterior genu. 4. Advanced atherosclerotic changes at the aortic arch without focal stenosis or aneurysm. 5. Extensive atherosclerotic changes within the right common carotid artery, at the bifurcation, and in the distal right ICA without a significant stenosis. 6. 50% stenosis of the proximal left common carotid artery. 7. Patent left carotid bifurcations stent. 8. 15 mm right peritracheal lymph node. While this may be reactive, neoplasm is not excluded. 9. Bilateral pleural effusions. 10. Emphysema.   Dg Chest Port 1 View 11/19/2014 No acute abnormality noted. No change from the prior exam.  11/17/2014 No acute cardiopulmonary disease   2D Echocardiogram EF 45% with no source of embolus. Diffuse hypokinesis.  Assessment: As you may recall, he is a 81 y.o. 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 BP high on admission, but controlled by carden and later po meds. He was discharged home. Now his ASA and plavix were resumed by his [place] physician. He also complains of orthostatic hypotension and fall at home, although  orthostatic vital today is normal. This is dilemma for treating her BP in the setting of orthostatic hypotension and hypertensive ICH. I decreased his metoprolol from  to , and he needs to check BP at home. Recommend CT repeat in [place].   Plan:  - CT head repeat in [place] to evaluate post hemorrhage state - metoprolol XR decrease from  daily to  daily - continue ASA 81 and plavix as per [facility] physician giving extensive hx of CAD and PVD - any neuro changes, he needs to call EMS and repeat CT head - Follow up with your primary care physician for stroke risk factor modification. Recommend maintain blood pressure goal <130/80, diabetes with hemoglobin A1c goal below 6.5% and lipids with LDL cholesterol goal below 70 mg/dL.  - RTC in 3 months.  No orders of the defined types were placed in this encounter.    Meds ordered this encounter  Medications  . atorvastatin (LIPITOR) 80 MG tablet    Sig: Take 80 mg by mouth daily.  . clopidogrel (PLAVIX) 75 MG tablet    Sig: Take 75 mg by mouth daily.    Patient Instructions  - [facility] to [facility] head to see the  resolution of brain bleed - change metoprolol to 50 mg once a day = 1/4 of the tablets every day - continue other home medications including ASA 81 and plavix - check BP at home, BP goal 120-140 - Follow up with [facility] for stroke risk factor modification. Recommend maintain blood pressure goal <130/80, diabetes with hemoglobin A1c goal below 6.5% and lipids with LDL cholesterol goal below 70 mg/dL.  - follow up in 3 months    Marvel PlanJindong Aralynn Brake, MD PhD [facility] [address], [address] [place], [place] [id number] [phone]

## 2015-02-07 ENCOUNTER — Ambulatory Visit: Payer: Non-veteran care | Admitting: Neurology

## 2015-05-07 ENCOUNTER — Encounter: Payer: Self-pay | Admitting: Neurology

## 2015-05-07 ENCOUNTER — Ambulatory Visit (INDEPENDENT_AMBULATORY_CARE_PROVIDER_SITE_OTHER): Payer: Medicare Other | Admitting: Neurology

## 2015-05-07 VITALS — BP 112/70 | HR 74 | Wt 141.0 lb

## 2015-05-07 DIAGNOSIS — I739 Peripheral vascular disease, unspecified: Secondary | ICD-10-CM | POA: Diagnosis not present

## 2015-05-07 DIAGNOSIS — E785 Hyperlipidemia, unspecified: Secondary | ICD-10-CM | POA: Diagnosis not present

## 2015-05-07 DIAGNOSIS — I10 Essential (primary) hypertension: Secondary | ICD-10-CM

## 2015-05-07 DIAGNOSIS — Z950 Presence of cardiac pacemaker: Secondary | ICD-10-CM | POA: Diagnosis not present

## 2015-05-07 DIAGNOSIS — I25119 Atherosclerotic heart disease of native coronary artery with unspecified angina pectoris: Secondary | ICD-10-CM

## 2015-05-07 DIAGNOSIS — I611 Nontraumatic intracerebral hemorrhage in hemisphere, cortical: Secondary | ICD-10-CM | POA: Diagnosis not present

## 2015-05-07 NOTE — Patient Instructions (Signed)
-   [facility] with [facility] and repeat CT head to follow up with ICH - continue ASA and plavix as directed by your [facility] physician - continue other home medications - check BP at home - Follow up with your [facility] doctor for stroke risk factor modification. Recommend maintain blood pressure goal <130/80, diabetes with hemoglobin A1c goal below 6.5% and lipids with LDL cholesterol goal below 70 mg/dL.  - follow up as needed.

## 2015-05-07 NOTE — Progress Notes (Signed)
STROKE NEUROLOGY FOLLOW UP NOTE  NAME: [name] DOB: 07/12/1927  REASON FOR VISIT: stroke follow up HISTORY FROM: pt and chart  Today we had the pleasure of seeing [name] in follow-up at our Neurology Clinic. Pt was accompanied by no one.   History Summary [name] is an 81 y.o. 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 CT head showed a small intraparenchymal hemorrhage involving the left centrum semi-ovale. NIH stroke score was 2. He has been taking aspirin and Plavix daily. He was admitted to the neuro ICU for further evaluation and treatment. His plavix discontinued and ASA was resumed in 2 weeks given his extensive hx of CAD and last stent was 01/2014. His BP was high on admission, initially on cardene drip and then stabilized on po meds. He was discharged to home in good condition.   01/29/15 follow up - the patient has been doing well. No recurrent stroke symptoms. No headache. He has not have repeat CT yet. He has been [place] back [place] He stated that he has hx of orthostatic hypotension, he stands up and then BP drops and he fell. For the last 3 months, he fell 10 times, last fall was two weeks ago. Today in Clinic BP 134/75.    Interval History During the interval time, pt was doing fine. [facility] and [facility] CT head repeat on 05/20/15. He continued with metoprolol 50mg  daily and did not change to 25mg  as I instructed him to do, but now he takes 50mg  at night and he stated that his orthostatic dizziness is gone. No more falling. His BP today 121/78. He said his BP at home is 120s/70-80. No other complains.   REVIEW OF SYSTEMS: Full 14 system review of systems performed and notable only for those listed below  and in HPI above, all others are negative:  Constitutional:   Cardiovascular:  Ear/Nose/Throat:   Skin:  Eyes:   Respiratory:   Gastroitestinal:   Genitourinary:  Hematology/Lymphatic:   Endocrine:  Musculoskeletal:   Allergy/Immunology:   Neurological:   Psychiatric:  Sleep:   The following represents the patient's updated allergies and side effects list: Allergies  Allergen Reactions  . Pravachol     myalgias  . Simvastatin     myalgia  . Terazosin     The neurologically relevant items on the patient's problem list were reviewed on today's visit.  Neurologic Examination  A problem focused neurological exam (12 or more points of the single system neurologic examination, vital signs counts as 1 point, cranial nerves count for 8 points) was performed.  Blood pressure 112/70, pulse 74, weight 141 lb (63.957 kg).   General - Well nourished, well developed, in no apparent distress.  Ophthalmologic - fundi not visualized due to cataracts.  Cardiovascular - Regular rate and rhythm.  Mental Status -  Level of arousal and orientation to time, place, and person were intact. Language including expression, naming, repetition, comprehension was assessed and found intact.  Cranial Nerves II - XII - II - Visual field intact OU. III, IV, VI - Extraocular movements intact. V - Facial sensation intact bilaterally. VII - Facial movement intact bilaterally. VIII - Hearing & vestibular intact bilaterally. X -  Palate elevates symmetrically. XI - Chin turning & shoulder shrug intact bilaterally. XII - Tongue protrusion intact.  Motor Strength - The patient's strength was normal 5-/5 in all extremities and pronator drift was absent.  Bulk was normal and fasciculations were absent.   Motor Tone - Muscle tone was assessed at the neck and appendages and was normal.  Reflexes - The patient's reflexes were 1+ in all extremities and he had no pathological reflexes.  Sensory - Light  touch, temperature/pinprick were assessed and were normal.    Coordination - The patient had normal movements in the hands with no ataxia or dysmetria.  Tremor was absent.  Gait and Station - walk with cane, mild stooped posturing, slow and small gait, steady, no fall.  Data reviewed: I personally reviewed the images and agree with the radiology interpretations.  Ct Head Wo Contrast 11/17/2014 1. Small focus of intraparenchymal hemorrhage in the left centrum semiovale measuring 9.5 mm. 2. Little if any mass effect or surrounding edema. 3. Moderate atrophy and small vessel disease. 4.   Ct Angio Head & Neck W/cm &/or Wo/cm 11/18/2014 1. Stable 10 mm hyperdense lesion in the high left parietal lobe with surrounding vasogenic edema likely accounts for the patient's symptoms. 2. Moderate stenosis in the distal right M1 segment. 3. Moderate stenosis of the cavernous right ICA just proximal to the anterior genu. 4. Advanced atherosclerotic changes at the aortic arch without focal stenosis or aneurysm. 5. Extensive atherosclerotic changes within the right common carotid artery, at the bifurcation, and in the distal right ICA without a significant stenosis. 6. 50% stenosis of the proximal left common carotid artery. 7. Patent left carotid bifurcations stent. 8. 15 mm right peritracheal lymph node. While this may be reactive, neoplasm is not excluded. 9. Bilateral pleural effusions. 10. Emphysema.   Dg Chest Port 1 View 11/19/2014 No acute abnormality noted. No change from the prior exam.  11/17/2014 No acute cardiopulmonary disease   2D Echocardiogram EF 45% with no source of embolus. Diffuse hypokinesis.  Assessment: As you may recall, he is a 81 y.o. 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 BP high on admission, but controlled by carden and later po meds. He was discharged home. His ASA and plavix were resumed by his [place] physician. He takes metoprolol at night so his orthostatic dizziness is gone. Will have CT repeat in [place].   Plan:  - [facility] and [facility] CT head to follow up with ICH (expecting complete resolution) - continue ASA and plavix as directed by [facility] physician - continue other home medications - check BP at home - Follow up with [facility] for stroke risk factor modification. Recommend maintain blood pressure goal <130/80, diabetes with hemoglobin A1c goal below 6.5% and lipids with LDL cholesterol goal below 70 mg/dL.  - RTC PRN.  No orders of the defined types were placed in this encounter.    No orders of the defined types were placed in this encounter.    Patient Instructions  - follow [facility] with [facility] doctor and repeat CT head to follow up with ICH - continue ASA and plavix as directed by [facility] - continue other home medications - check BP at home - Follow [facility] with [facility] for stroke risk  factor modification. Recommend maintain blood pressure goal <130/80, diabetes with hemoglobin A1c goal below 6.5% and lipids with LDL cholesterol goal below 70 mg/dL.  - follow up as needed.    Marvel Plan, MD PhD [facility] [address] [place], [place] [address] [phone]

## 2015-05-12 ENCOUNTER — Other Ambulatory Visit: Payer: Self-pay | Admitting: Family Medicine

## 2015-05-12 DIAGNOSIS — I739 Peripheral vascular disease, unspecified: Secondary | ICD-10-CM

## 2015-05-13 ENCOUNTER — Observation Stay (HOSPITAL_COMMUNITY)
Admission: EM | Admit: 2015-05-13 | Discharge: 2015-05-14 | Disposition: A | Discharge status: Home / Self Care | Payer: Medicare Other | Attending: Cardiovascular Disease | Admitting: Cardiovascular Disease

## 2015-05-13 ENCOUNTER — Emergency Department (HOSPITAL_COMMUNITY): Payer: Medicare Other

## 2015-05-13 ENCOUNTER — Inpatient Hospital Stay (HOSPITAL_COMMUNITY): Admit: 2015-05-13 | Payer: Medicare Other

## 2015-05-13 ENCOUNTER — Encounter (HOSPITAL_COMMUNITY): Payer: Self-pay | Admitting: Physical Medicine and Rehabilitation

## 2015-05-13 ENCOUNTER — Emergency Department (HOSPITAL_BASED_OUTPATIENT_CLINIC_OR_DEPARTMENT_OTHER)
Admit: 2015-05-13 | Discharge: 2015-05-13 | Disposition: A | Discharge status: Home / Self Care | Payer: Medicare Other | Attending: Emergency Medicine | Admitting: Emergency Medicine

## 2015-05-13 DIAGNOSIS — R778 Other specified abnormalities of plasma proteins: Secondary | ICD-10-CM

## 2015-05-13 DIAGNOSIS — Z951 Presence of aortocoronary bypass graft: Secondary | ICD-10-CM | POA: Diagnosis not present

## 2015-05-13 DIAGNOSIS — J449 Chronic obstructive pulmonary disease, unspecified: Secondary | ICD-10-CM | POA: Insufficient documentation

## 2015-05-13 DIAGNOSIS — I5022 Chronic systolic (congestive) heart failure: Secondary | ICD-10-CM | POA: Diagnosis not present

## 2015-05-13 DIAGNOSIS — R791 Abnormal coagulation profile: Secondary | ICD-10-CM | POA: Diagnosis not present

## 2015-05-13 DIAGNOSIS — R112 Nausea with vomiting, unspecified: Secondary | ICD-10-CM

## 2015-05-13 DIAGNOSIS — I951 Orthostatic hypotension: Secondary | ICD-10-CM | POA: Diagnosis not present

## 2015-05-13 DIAGNOSIS — Z7982 Long term (current) use of aspirin: Secondary | ICD-10-CM | POA: Diagnosis not present

## 2015-05-13 DIAGNOSIS — E785 Hyperlipidemia, unspecified: Secondary | ICD-10-CM | POA: Insufficient documentation

## 2015-05-13 DIAGNOSIS — Z950 Presence of cardiac pacemaker: Secondary | ICD-10-CM | POA: Diagnosis not present

## 2015-05-13 DIAGNOSIS — M79609 Pain in unspecified limb: Secondary | ICD-10-CM | POA: Diagnosis not present

## 2015-05-13 DIAGNOSIS — Z888 Allergy status to other drugs, medicaments and biological substances status: Secondary | ICD-10-CM | POA: Insufficient documentation

## 2015-05-13 DIAGNOSIS — F17210 Nicotine dependence, cigarettes, uncomplicated: Secondary | ICD-10-CM | POA: Insufficient documentation

## 2015-05-13 DIAGNOSIS — I739 Peripheral vascular disease, unspecified: Secondary | ICD-10-CM | POA: Diagnosis not present

## 2015-05-13 DIAGNOSIS — R748 Abnormal levels of other serum enzymes: Secondary | ICD-10-CM | POA: Diagnosis not present

## 2015-05-13 DIAGNOSIS — R5383 Other fatigue: Secondary | ICD-10-CM | POA: Diagnosis present

## 2015-05-13 DIAGNOSIS — I10 Essential (primary) hypertension: Secondary | ICD-10-CM | POA: Diagnosis not present

## 2015-05-13 DIAGNOSIS — E039 Hypothyroidism, unspecified: Secondary | ICD-10-CM | POA: Insufficient documentation

## 2015-05-13 DIAGNOSIS — I2510 Atherosclerotic heart disease of native coronary artery without angina pectoris: Secondary | ICD-10-CM | POA: Diagnosis not present

## 2015-05-13 DIAGNOSIS — R7989 Other specified abnormal findings of blood chemistry: Secondary | ICD-10-CM

## 2015-05-13 DIAGNOSIS — Z8673 Personal history of transient ischemic attack (TIA), and cerebral infarction without residual deficits: Secondary | ICD-10-CM | POA: Diagnosis not present

## 2015-05-13 LAB — URINALYSIS, ROUTINE W REFLEX MICROSCOPIC
Bilirubin Urine: NEGATIVE
Glucose, UA: NEGATIVE mg/dL
Ketones, ur: NEGATIVE mg/dL
LEUKOCYTES UA: NEGATIVE
Nitrite: NEGATIVE
Protein, ur: 30 mg/dL — AB
Specific Gravity, Urine: 1.018 (ref 1.005–1.030)
Urobilinogen, UA: 0.2 mg/dL (ref 0.0–1.0)
pH: 6.5 (ref 5.0–8.0)

## 2015-05-13 LAB — CBC WITH DIFFERENTIAL/PLATELET
BASOS ABS: 0 10*3/uL (ref 0.0–0.1)
Basophils Relative: 0 % (ref 0–1)
Eosinophils Absolute: 0.1 10*3/uL (ref 0.0–0.7)
Eosinophils Relative: 1 % (ref 0–5)
HEMATOCRIT: 45.1 % (ref 39.0–52.0)
Hemoglobin: 15.5 g/dL (ref 13.0–17.0)
Lymphocytes Relative: 19 % (ref 12–46)
Lymphs Abs: 2.1 10*3/uL (ref 0.7–4.0)
MCH: 32.5 pg (ref 26.0–34.0)
MCHC: 34.4 g/dL (ref 30.0–36.0)
MCV: 94.5 fL (ref 78.0–100.0)
Monocytes Absolute: 0.4 10*3/uL (ref 0.1–1.0)
Monocytes Relative: 3 % (ref 3–12)
NEUTROS PCT: 77 % (ref 43–77)
Neutro Abs: 8.4 10*3/uL — ABNORMAL HIGH (ref 1.7–7.7)
PLATELETS: 145 10*3/uL — AB (ref 150–400)
RBC: 4.77 MIL/uL (ref 4.22–5.81)
RDW: 15.3 % (ref 11.5–15.5)
WBC: 11 10*3/uL — ABNORMAL HIGH (ref 4.0–10.5)

## 2015-05-13 LAB — CBC
HEMATOCRIT: 42.7 % (ref 39.0–52.0)
Hemoglobin: 14.5 g/dL (ref 13.0–17.0)
MCH: 32.2 pg (ref 26.0–34.0)
MCHC: 34 g/dL (ref 30.0–36.0)
MCV: 94.9 fL (ref 78.0–100.0)
Platelets: 144 10*3/uL — ABNORMAL LOW (ref 150–400)
RBC: 4.5 MIL/uL (ref 4.22–5.81)
RDW: 15.3 % (ref 11.5–15.5)
WBC: 10 10*3/uL (ref 4.0–10.5)

## 2015-05-13 LAB — COMPREHENSIVE METABOLIC PANEL
ALT: 13 U/L — ABNORMAL LOW (ref 17–63)
ANION GAP: 13 (ref 5–15)
AST: 21 U/L (ref 15–41)
Albumin: 3.5 g/dL (ref 3.5–5.0)
Alkaline Phosphatase: 103 U/L (ref 38–126)
BILIRUBIN TOTAL: 0.4 mg/dL (ref 0.3–1.2)
BUN: 15 mg/dL (ref 6–20)
CALCIUM: 8.7 mg/dL — AB (ref 8.9–10.3)
CHLORIDE: 103 mmol/L (ref 101–111)
CO2: 24 mmol/L (ref 22–32)
CREATININE: 1.42 mg/dL — AB (ref 0.61–1.24)
GFR calc Af Amer: 50 mL/min — ABNORMAL LOW (ref >60)
GFR calc non Af Amer: 43 mL/min — ABNORMAL LOW (ref >60)
Glucose, Bld: 102 mg/dL — ABNORMAL HIGH (ref 65–99)
Potassium: 3.4 mmol/L — ABNORMAL LOW (ref 3.5–5.1)
SODIUM: 140 mmol/L (ref 135–145)
Total Protein: 7.1 g/dL (ref 6.5–8.1)

## 2015-05-13 LAB — URINE MICROSCOPIC-ADD ON

## 2015-05-13 LAB — TROPONIN I
TROPONIN I: 0.18 ng/mL — AB (ref <0.031)
Troponin I: 0.17 ng/mL — ABNORMAL HIGH (ref <0.031)

## 2015-05-13 LAB — I-STAT TROPONIN, ED
TROPONIN I, POC: 0.14 ng/mL — AB (ref 0.00–0.08)
Troponin i, poc: 0.17 ng/mL (ref 0.00–0.08)

## 2015-05-13 LAB — PROTIME-INR
INR: 1.11 (ref 0.00–1.49)
Prothrombin Time: 14.5 seconds (ref 11.6–15.2)

## 2015-05-13 LAB — CREATININE, SERUM
CREATININE: 1.53 mg/dL — AB (ref 0.61–1.24)
GFR calc Af Amer: 46 mL/min — ABNORMAL LOW (ref >60)
GFR, EST NON AFRICAN AMERICAN: 39 mL/min — AB (ref >60)

## 2015-05-13 LAB — MAGNESIUM: Magnesium: 2.2 mg/dL (ref 1.7–2.4)

## 2015-05-13 LAB — D-DIMER, QUANTITATIVE: D-Dimer, Quant: >20 ug/mL-FEU — ABNORMAL HIGH (ref 0.00–0.48)

## 2015-05-13 MED ORDER — CLOPIDOGREL BISULFATE 75 MG PO TABS
75.0000 mg | ORAL_TABLET | Freq: Every day | ORAL | Status: DC
  Administered 2015-05-13: 75 mg via ORAL
  Filled 2015-05-13 (×2): qty 1

## 2015-05-13 MED ORDER — ENOXAPARIN SODIUM 40 MG/0.4ML ~~LOC~~ SOLN
40.0000 mg | SUBCUTANEOUS | Status: DC
  Administered 2015-05-13: 40 mg via SUBCUTANEOUS
  Filled 2015-05-13 (×2): qty 0.4

## 2015-05-13 MED ORDER — SODIUM CHLORIDE 0.9 % IV BOLUS (SEPSIS)
500.0000 mL | Freq: Once | INTRAVENOUS | Status: AC
  Administered 2015-05-13: 500 mL via INTRAVENOUS

## 2015-05-13 MED ORDER — ATORVASTATIN CALCIUM 40 MG PO TABS
40.0000 mg | ORAL_TABLET | Freq: Every day | ORAL | Status: DC
  Filled 2015-05-13: qty 1

## 2015-05-13 MED ORDER — BOOST HIGH PROTEIN PO LIQD
1.0000 | Freq: Three times a day (TID) | ORAL | Status: DC
  Administered 2015-05-13 – 2015-05-14 (×2): 237 mL via ORAL
  Filled 2015-05-13 (×6): qty 237

## 2015-05-13 MED ORDER — NITROGLYCERIN 0.4 MG SL SUBL: 0.4000 mg | SUBLINGUAL_TABLET | SUBLINGUAL | Status: DC | PRN

## 2015-05-13 MED ORDER — ISOSORBIDE MONONITRATE ER 60 MG PO TB24
60.0000 mg | ORAL_TABLET | Freq: Every day | ORAL | Status: DC
  Administered 2015-05-13: 60 mg via ORAL
  Filled 2015-05-13 (×2): qty 1

## 2015-05-13 MED ORDER — ONDANSETRON 4 MG PO TBDP
4.0000 mg | ORAL_TABLET | Freq: Once | ORAL | Status: AC
  Administered 2015-05-13: 4 mg via ORAL
  Filled 2015-05-13: qty 1

## 2015-05-13 MED ORDER — ASPIRIN 81 MG PO CHEW
324.0000 mg | CHEWABLE_TABLET | Freq: Once | ORAL | Status: AC
  Administered 2015-05-13: 324 mg via ORAL
  Filled 2015-05-13: qty 4

## 2015-05-13 MED ORDER — METOPROLOL SUCCINATE ER 50 MG PO TB24
50.0000 mg | ORAL_TABLET | Freq: Every day | ORAL | Status: DC
  Administered 2015-05-13: 50 mg via ORAL
  Filled 2015-05-13 (×2): qty 1

## 2015-05-13 MED ORDER — TRAMADOL HCL 50 MG PO TABS: 50.0000 mg | ORAL_TABLET | Freq: Three times a day (TID) | ORAL | Status: DC | PRN

## 2015-05-13 MED ORDER — AMLODIPINE BESYLATE 10 MG PO TABS
10.0000 mg | ORAL_TABLET | Freq: Every day | ORAL | Status: DC
  Administered 2015-05-13: 10 mg via ORAL
  Filled 2015-05-13 (×2): qty 1

## 2015-05-13 MED ORDER — TAMSULOSIN HCL 0.4 MG PO CAPS
0.4000 mg | ORAL_CAPSULE | ORAL | Status: DC
  Administered 2015-05-13: 0.4 mg via ORAL
  Filled 2015-05-13: qty 1

## 2015-05-13 MED ORDER — FUROSEMIDE 20 MG PO TABS
20.0000 mg | ORAL_TABLET | Freq: Every day | ORAL | Status: DC
  Administered 2015-05-13: 20 mg via ORAL
  Filled 2015-05-13 (×2): qty 1

## 2015-05-13 MED ORDER — IOHEXOL 350 MG/ML SOLN
100.0000 mL | Freq: Once | INTRAVENOUS | Status: AC | PRN
  Administered 2015-05-13: 80 mL via INTRAVENOUS

## 2015-05-13 MED ORDER — ACETAMINOPHEN 325 MG PO TABS: 650.0000 mg | ORAL_TABLET | ORAL | Status: DC | PRN

## 2015-05-13 MED ORDER — ASPIRIN 81 MG PO CHEW
81.0000 mg | CHEWABLE_TABLET | Freq: Every day | ORAL | Status: DC
  Administered 2015-05-13: 81 mg via ORAL
  Filled 2015-05-13 (×2): qty 1

## 2015-05-13 MED ORDER — ONDANSETRON HCL 4 MG/2ML IJ SOLN: 4.0000 mg | Freq: Four times a day (QID) | INTRAMUSCULAR | Status: DC | PRN

## 2015-05-13 NOTE — ED Provider Notes (Signed)
CSN: [id number]     Arrival date & time 05/13/15  0906 History   First MD Initiated Contact with Patient 05/13/15 0920     Chief Complaint  Patient presents with  . Nausea  . Fatigue  . Dizziness     (Consider location/radiation/quality/duration/timing/severity/associated sxs/prior Treatment) HPI    PCP: BURNETT,BRENT A, MD Blood pressure 158/75, pulse 72, temperature 98.6 F (37 C), temperature source Oral, resp. rate 18, SpO2 98 %.  [name] is a 81 y.o.male with a significant PMH of hypertension, CAD, TIA, hyperlipidemia, COPD, PAD, sick sinus syndrome, CHF, abdominal bruit, AAA, peripheral vascular disease, stroke, hypothyroid, stroke presents to the ER with complaints of nausea, vomiting, fatigue and diaphoresis. He woke up feeling well this morning without any symptoms, drove approx 5 miles to meet his son for breakfast and was not able to get out of the car due to nausea, vomiting, fatigue, diaphoresis and feeling unwell. The son brought him straight to the ER for evaluation. The patient currently reports feeling no symptoms but admits that it comes in waves. He did not eat dinner last night. He denies any headache or focal fatigue. He is not having any CP, SOB, lower extremity swelling. He was recently started on Tramadol for chronic leg pain, the patient denies having any of this medication but his daughter reports 3 pills are missing from the bottle. Per the son the patients mentation at baseline. No obvious neuro deficits during interview.    Past Medical History  Diagnosis Date  . Hypertension   . CAD (coronary artery disease)     a. s/p MI in 1988;  b. s/p CABG in 2000 ([name]) x 4, LIMA to LAD, SVG to diagonal, [facility], SVG to PDA;  c. 12/2013 NSTEMI/abnl MV;  d. 01/2014 Cath/PCI: native 3VD, VG->RCA 100, VG->OM aneurysmal 40ost/m, 6033m(5.0x12 Veriflex BMS), VG->Diag aneurysmal, LIMA->LAD nl.  . TIA (transient ischemic attack)   . Hyperlipidemia   . COPD (chronic  obstructive pulmonary disease)   . CVA (cerebral infarction) 07/2011    Remote left brain infarct with sensory deficits and had a left internal carotid stent placed by Dr. Corliss Skainseveshwar at that time, no recurrent CVA or TIAs or CNS events.  Marland Kitchen. PAD (peripheral artery disease)     Stable. Had a past RFPBG by Dr. Arbie CookeyEarly in 2009. this graft is occluded with reconstitution in the popliteal artery, which was patent. Right tibial was occluded and 2-vessel runoff on angiography done by Dr. Myra GianottiBrabham in 12/2010.  . Sick sinus syndrome     PPM implanted for this and syncope   . Chronic systolic CHF (congestive heart failure)     Hospitalization for CHF from 05/04/13-05/06/13. Treated medically. Had possible pneumonia with short course of antibiotics.  . Abdominal bruit     Loud  . AAA (abdominal aortic aneurysm)     Has a known AAA of 3.2 x 3.4 cm by abdominal untrasound 03/27/12. Followup abdominal ultrasound on 03/29/13 showed a diameter of 3.6 cm, which is stable  . Peripheral vascular disease   . Hypothyroid     On supplement "don't know if I take RX or not for my thyroid" (01/16/2014)  . Stroke    Past Surgical History  Procedure Laterality Date  . Femoral bypass Right ?2001  . Carotid stent Left 06/23/11    By Dr. Myra GianottiBrabham  . Coronary artery bypass graft  2000    1st in 2000 ([name]) x 4, LIMA to LAD, SVG to  diagonal, SVG to OM4, SVG to PDA. Re-cath in May 2012 with patent LIMA to LAD, patent SVG to diagonal, patent SVG to OM with left-to-right collaterals to an occluded right.  . Coronary angioplasty with stent placement  2001-01/16/2014    "think today makes #4" (01/16/2014)  . Cardiac catheterization  2000  . Inguinal hernia repair Right 1980's  . Insert / replace / remove pacemaker      implanted 2003 by Dr Jenne Campus with gen change (MDT [id number]) 08/2010 by [facility]  . Cataract extraction w/ intraocular lens  implant, bilateral Bilateral   . Abdominal aortagram N/A 02/22/2012    Procedure: ABDOMINAL AORTAGRAM;   Surgeon: Nada Libman, MD;  Location: [facility] CATH LAB;  Service: Cardiovascular;  Laterality: N/A;  . Carotid angiogram Bilateral 02/22/2012    Procedure: CAROTID ANGIOGRAM;  Surgeon: Nada Libman, MD;  Location: [facility] CATH LAB;  Service: Cardiovascular;  Laterality: Bilateral;  . Left heart catheterization with coronary/graft angiogram N/A 01/16/2014    Procedure: LEFT HEART CATHETERIZATION WITH Isabel Caprice;  Surgeon: Kathleene Hazel, MD;  Location: [facility] CATH LAB;  Service: Cardiovascular;  Laterality: N/A;   Family History  Problem Relation Age of Onset  . Cancer Brother 60    oral   . Prostate cancer Brother   . Hypertension Brother   . Hyperlipidemia Brother   . Lung disease Father     black lung disease   History  Substance Use Topics  . Smoking status: Current Every Day Smoker -- 0.50 packs/day for 68 years    Types: Cigarettes  . Smokeless tobacco: Former Neurosurgeon    Quit date: 06/02/2011     Comment: 05/07/15 smokes 1/3 PPD, trying to quit  . Alcohol Use: No    Review of Systems  10 Systems reviewed and are negative for acute change except as noted in the HPI.    Allergies  Pravachol; Simvastatin; and Terazosin  Home Medications   Prior to Admission medications   Medication Sig Start Date End Date Taking? Authorizing Provider  amLODipine (NORVASC) 10 MG tablet Take 0.5 tablets (5 mg total) by mouth daily after supper. Patient taking differently: Take 10 mg by mouth daily after supper.  07/16/14  Yes Lars Masson, MD  aspirin 81 MG EC tablet Take 1 tablet (81 mg total) by mouth daily. Swallow whole. DO NOT RESUME UNTIL 12/29 due to Hemorrhage in your brain. Patient taking differently: Take 81 mg by mouth at bedtime.  12/03/14  Yes Layne Benton, NP  atorvastatin (LIPITOR) 80 MG tablet Take 40 mg by mouth daily at 6 PM.    Yes Historical Provider, MD  clopidogrel (PLAVIX) 75 MG tablet Take 75 mg by mouth at bedtime.    Yes Historical Provider, MD   feeding supplement (BOOST HIGH PROTEIN) LIQD Take 1 Container by mouth 3 (three) times daily between meals.   Yes Historical Provider, MD  furosemide (LASIX) 20 MG tablet Take 20 mg by mouth at bedtime.  03/22/15  Yes Historical Provider, MD  isosorbide mononitrate (IMDUR) 60 MG 24 hr tablet Take 1 tablet (60 mg total) by mouth at bedtime. 07/16/14  Yes Lars Masson, MD  metoprolol (TOPROL-XL) 200 MG 24 hr tablet Take 0.5 tablets (100 mg total) by mouth every morning. Patient taking differently: Take 50 mg by mouth at bedtime.  07/16/14  Yes Lars Masson, MD  Tamsulosin HCl (FLOMAX) 0.4 MG CAPS Take 0.4 mg by mouth daily after supper.    Yes Historical  Provider, MD  traMADol (ULTRAM) 50 MG tablet Take 50 mg by mouth 3 (three) times daily as needed for severe pain.   Yes Historical Provider, MD  feeding supplement, ENSURE COMPLETE, (ENSURE COMPLETE) LIQD Take 237 mLs by mouth 2 (two) times daily between meals. Patient not taking: Reported on 05/13/2015 01/05/14   Renae Fickle, MD  nitroGLYCERIN (NITROSTAT) 0.4 MG SL tablet Place 1 tablet (0.4 mg total) under the tongue every 5 (five) minutes as needed. For chest pain 06/11/14   Hollice Espy, MD  potassium chloride (K-DUR) 10 MEQ tablet Take 1 tablet (10 mEq total) by mouth daily. Patient not taking: Reported on 05/07/2015 07/17/14   Lars Masson, MD   BP 189/94 mmHg  Pulse 75  Temp(Src) 98.6 F (37 C) (Oral)  Resp 14  SpO2 91% Physical Exam  Constitutional: He appears well-developed and well-nourished. No distress.  HENT:  Head: Normocephalic and atraumatic.  Eyes: Pupils are equal, round, and reactive to light.  Neck: Normal range of motion. Neck supple.  Cardiovascular: Normal rate and regular rhythm.   Pulmonary/Chest: Effort normal.  Abdominal: Soft.  Neurological: He is alert.  Cranial nerves II-VIII and X-XII evaluated and show no deficits. Pt alert and oriented x 3 Upper and lower extremity strength is symmetrical  and physiologic Normal muscular tone No facial droop Coordination intact, no limb ataxia, finger-nose-finger normal.   Baseline tremor present  Skin: Skin is warm and dry.  Nursing note and vitals reviewed.   ED Course  Procedures (including critical care time) Labs Review Labs Reviewed  TROPONIN I - Abnormal; Notable for the following:    Troponin I 0.17 (*)    All other components within normal limits  CBC WITH DIFFERENTIAL/PLATELET - Abnormal; Notable for the following:    WBC 11.0 (*)    Platelets 145 (*)    Neutro Abs 8.4 (*)    All other components within normal limits  COMPREHENSIVE METABOLIC PANEL - Abnormal; Notable for the following:    Potassium 3.4 (*)    Glucose, Bld 102 (*)    Creatinine, Ser 1.42 (*)    Calcium 8.7 (*)    ALT 13 (*)    GFR calc non Af Amer 43 (*)    GFR calc Af Amer 50 (*)    All other components within normal limits  URINALYSIS, ROUTINE W REFLEX MICROSCOPIC (NOT AT [facility]) - Abnormal; Notable for the following:    Hgb urine dipstick MODERATE (*)    Protein, ur 30 (*)    All other components within normal limits  D-DIMER, QUANTITATIVE (NOT AT [facility]) - Abnormal; Notable for the following:    D-Dimer, Quant >20.00 (*)    All other components within normal limits  URINE MICROSCOPIC-ADD ON - Abnormal; Notable for the following:    Casts GRANULAR CAST (*)    All other components within normal limits  I-STAT TROPOININ, ED - Abnormal; Notable for the following:    Troponin i, poc 0.17 (*)    All other components within normal limits  I-STAT TROPOININ, ED - Abnormal; Notable for the following:    Troponin i, poc 0.14 (*)    All other components within normal limits  MAGNESIUM  PROTIME-INR    Imaging Review Ct Angio Chest Pe W/cm &/or Wo Cm  05/13/2015   CLINICAL DATA:  Nausea, vomiting, generalized weakness and dizziness. Onset this morning.  EXAM: CT ANGIOGRAPHY CHEST WITH CONTRAST  TECHNIQUE: Multidetector CT imaging of the chest was  performed  using the standard protocol during bolus administration of intravenous contrast. Multiplanar CT image reconstructions and MIPs were obtained to evaluate the vascular anatomy.  CONTRAST:  80mL OMNIPAQUE IOHEXOL 350 MG/ML SOLN  COMPARISON:  11/07/2003  FINDINGS: CT CHEST FINDINGS  Mediastinum/Nodes: There is cardiomegaly. Diffuse coronary artery calcifications. Prior CABG.  There is dilatation of the entire descending thoracic aorta, largest in the mid and distal descending thoracic aorta where the aorta measures up to 5.1 cm. This is new since 2004.  No mediastinal, hilar, or axillary adenopathy. No filling defects in the pulmonary artery to suggest pulmonary emboli.  Lungs/Pleura: Small left pleural effusion and trace right pleural effusion. Ground-glass opacities dependently in the lower lobes could reflect atelectasis or edema. Underlying COPD/emphysema.  Chest wall: Chest wall soft tissues are unremarkable.  Upper abdomen: No acute findings in the visualized upper abdomen.  Musculoskeletal: No acute bony abnormality or focal bone lesion.  Review of the MIP images confirms the above findings.  IMPRESSION: Diffuse descending thoracic aortic aneurysm, the largest distally, measuring up to 5.1 cm. Recommend vascular surgery referral and consultation if not already performed.  Cardiomegaly. Small bilateral effusions. Ground-glass opacities in the lungs could reflect edema or atelectasis.  Underlying emphysema.   Electronically Signed   By: Charlett Nose M.D.   On: 05/13/2015 15:01   Dg Chest Port 1 View  05/13/2015   CLINICAL DATA:  Fatigue, nausea, vomiting, history hypertension, coronary artery disease, hyperlipidemia, COPD, stroke, chronic systolic CHF  EXAM: PORTABLE CHEST - 1 VIEW  COMPARISON:  Portable exam 1004 hours compared to 11/19/2014  FINDINGS: LEFT subclavian transvenous pacemaker leads project over RIGHT atrium and RIGHT ventricle unchanged.  Enlargement of cardiac silhouette post CABG.   Atherosclerotic calcification and tortuosity of thoracic aorta.  Pulmonary vascularity normal.  Mild rotation to the LEFT.  Subsegmental atelectasis RIGHT base.  No gross infiltrate, pleural effusion or pneumothorax.  IMPRESSION: Enlargement of cardiac silhouette post CABG and pacemaker.  Subsegmental atelectasis RIGHT base.   Electronically Signed   By: Ulyses Southward M.D.   On: 05/13/2015 10:37     EKG Interpretation   Date/Time:  Tuesday May 13 2015 09:12:11 EDT Ventricular Rate:  75 PR Interval:  328 QRS Duration: 114 QT Interval:  428 QTC Calculation: 477 R Axis:   -55 Text Interpretation:  Atrial-paced rhythm with prolonged AV conduction  Pulmonary disease pattern Incomplete right bundle branch block Left  anterior fascicular block Left ventricular hypertrophy with repolarization  abnormality Abnormal ECG no significant change since Dec 2015 Confirmed by  GOLDSTON  MD, SCOTT ([id number]) on 05/13/2015 9:20:16 AM      MDM   Final diagnoses:  Nausea & vomiting  Fatigue  Elevated troponin   9:30 am - it appeared per the monitor that the patient had 2 runs of approx 10 beats of V-tach, will have pacemaker interrogated to see if patient is going into an arrhythmia. Dr. Criss Alvine and myself have discussed the case early on and he recommends adding on a Magnesium level, will hold off on head CT due to no focal neuro deficits at this time.   10:15 am- pt appears to have had another run of arrythmia, possible V-tach per Tele monitor, pads placed to chest as a precaution, Dr. Criss Alvine made aware, at 10: 22 am the patient Troponin has come back at 0.17 aspirin given.   10: 49 am- Interrogator does not show any significant arrhythmias, report being faxed over, Cardiology notified at this time of elevated Troponin and  has agreed to consult per Trish.  D-dimer elevated > 20, CT angio ordered, negative US doppler to lower extremity R.  Author: Gwendolyn Fill Service: Vascular Lab Author Type:  Cardiovascular Sonographer    Filed: 05/13/2015 1:21 PM Note Time: 05/13/2015 1:19 PM Status: Signed   Editor: Lawrence Marseilles Simonetti (Cardiovascular Sonographer)     Expand All Collapse All   *Preliminary Results* Right lower extremity venous duplex completed. Right lower extremity is negative for deep vein thrombosis. There is no evidence of right Baker's cyst.  Incidental finding: There is evidence of dampened monophasic flow in the right posterior tibial and peroneal arteries. The patient refused ABI evaluation.  05/13/2015 1:20 PM  Gertie Fey, RVT, RDCS, RDMS        Patient seen by cardiology who have determined the patient requires admission. - cardiology to admit.  Filed Vitals:   05/13/15 1545  BP: 189/94  Pulse: 75  Temp:   Resp:       Marlon Pel, PA-C 05/13/15 1628  Pricilla Loveless, MD 05/17/15 2307

## 2015-05-13 NOTE — ED Notes (Signed)
Vascular called about LE VENOUS, informed them that it was a stat order.  Asked Dr. Criss AlvineGoldston if patient is stable to have doppler done in vascular lab, he is.  Pt ready for transport.

## 2015-05-13 NOTE — H&P (Signed)
Patient ID: [name] MRN: [id number], DOB/AGE: [birth year]   Admit date: 05/13/2015 date: 05/13/2015   Primary Physician: Delorse Lek, MD [facility]  Pt. Profile:  81 yo AA male with PMH of HTN, HLD, AAA/abdominal bruits, hypothyroidism, orthostatic hypotension, PAD, COPD, SSS s/p Medtronic PPM (placed by Dr. Jenne Campus 2003 with generator change 08/2010), CVA and CAD s/p 4v CABG in 2000 (LIMA to LAD, SVG to diag, SVG to OM4, SVG to PDA) present with N/V and weakness and found to have d-dimer >20 and mildly elevated trop  Problem List  Past Medical History  Diagnosis Date  . Hypertension   . CAD (coronary artery disease)     a. s/p MI in 1988;  b. s/p CABG in 2000 (Dr. Barry Dienes) x 4, LIMA to LAD, SVG to diagonal, SVG to [facility], SVG to PDA;  c. 12/2013 NSTEMI/abnl MV;  d. 01/2014 Cath/PCI: native 3VD, VG->RCA 100, VG->OM aneurysmal 40ost/m, 60m(5.0x12 Veriflex BMS), VG->Diag aneurysmal, LIMA->LAD nl.  . TIA (transient ischemic attack)   . Hyperlipidemia   . COPD (chronic obstructive pulmonary disease)   . CVA (cerebral infarction) 07/2011    Remote left brain infarct with sensory deficits and had a left internal carotid stent placed by Dr. [name] at that time, no recurrent CVA or TIAs or CNS events.  Marland Kitchen PAD (peripheral artery disease)     Stable. Had a past RFPBG by Dr. [name] in 2009. this graft is occluded with reconstitution in the popliteal artery, which was patent. Right tibial was occluded and 2-vessel runoff on angiography done by [name] in 12/2010.  . Sick sinus syndrome     PPM implanted for this and syncope   . Chronic systolic CHF (congestive heart failure)     Hospitalization for CHF from 05/04/13-05/06/13. Treated medically. Had possible pneumonia with short course of antibiotics.  . Abdominal bruit     Loud  . AAA (abdominal aortic aneurysm)     Has a known AAA of 3.2 x 3.4 cm by abdominal untrasound 03/27/12. Followup abdominal ultrasound on 03/29/13  showed a diameter of 3.6 cm, which is stable  . Peripheral vascular disease   . Hypothyroid     On supplement "don't know if I take RX or not for my thyroid" (01/16/2014)  . Stroke     Past Surgical History  Procedure Laterality Date  . Femoral bypass Right ?2001  . Carotid stent Left 06/23/11    By [name]  . Coronary artery bypass graft  2000    1st in 2000 (Dr. Barry Dienes) x 4, LIMA to LAD, SVG to diagonal, SVG to OM4, SVG to PDA. Re-cath in May 2012 with patent LIMA to LAD, patent SVG to diagonal, patent SVG to OM with left-to-right collaterals to an occluded right.  . Coronary angioplasty with stent placement  2001-01/16/2014    "think today makes #4" (01/16/2014)  . Cardiac catheterization  2000  . Inguinal hernia repair Right 1980's  . Insert / replace / remove pacemaker      implanted 2003 by Dr Jenne Campus with gen change (MDT [id number]) [facility]  . Cataract extraction w/ intraocular lens  implant, bilateral Bilateral   . Abdominal aortagram N/A 02/22/2012    Procedure: ABDOMINAL AORTAGRAM;  Surgeon: Nada Libman, MD;  Location: [facility] CATH LAB;  Service: Cardiovascular;  Laterality: N/A;  . Carotid angiogram Bilateral 02/22/2012    Procedure: CAROTID ANGIOGRAM;  Surgeon: Nada Libman, MD;  Location: [facility] CATH LAB;  Service: Cardiovascular;  Laterality: Bilateral;  . Left heart catheterization with coronary/graft angiogram N/A 01/16/2014    Procedure: LEFT HEART CATHETERIZATION WITH Isabel Caprice;  Surgeon: Kathleene Hazel, MD;  Location: [facility] CATH LAB;  Service: Cardiovascular;  Laterality: N/A;     Allergies  Allergies  Allergen Reactions  . Pravachol     myalgias  . Simvastatin     myalgia  . Terazosin Other (See Comments)    unknown    HPI  The patient is a 81 yo AA male with PMH of HTN, HLD, AAA/abdominal bruits, hypothyroidism, orthostatic hypotension, PAD, COPD, SSS s/p Medtronic PPM (placed by Dr. Jenne Campus 2003 with generator change 08/2010), CVA and CAD  s/p 4v CABG in 2000 (LIMA to LAD, SVG to diag, SVG to OM4, SVG to PDA). His last cardiac catheterization on 01/17/2014 showed severe disease in the body of SVG to OM status post successful PTCA/bare metal stent, chronically occluded RCA, aneurysmal SVG to diag, patent LIMA to LAD. As for his previous anginal symptom, he never had any chest pain, his anginal symptom has all been shortness of breath. He was last seen by Dr. Delton See in August 2015 at which time he was doing well. He has been doing well at home. He's been fairly active given his 81. He denies any recent exertional chest discomfort, shortness breath, lower extremity edema, orthopnea or paroxysmal nocturnal dyspnea. He was admitted to neuro ICU in December 2015 with new onset numbness involving right arm and leg. CT of the head at that time showed small intraparenchymal hemorrhage involving the left centrum semi-ovale. She was continued on aspirin and Plavix was discontinued. His cardiologist at [facility] eventually restarted him on Plavix as well.  He woke up in the morning of 05/13/2015 feeling fine. He went up around 8 AM to meet up with his friends to grab breakfast at [employer]. They were chatting outside the restaurant while waiting for other people to arrive when he started having significant nausea, vomiting, subsequent weakness and dizziness. He denies any significant chest pain or shortness of breath. He was taken to [facility] for further workup. While at [facility], he had one more episode of vomiting which make 3 this morning. Significant laboratory finding include creatinine of 1.42, troponin 0.17, white blood cell 11, d-dimer > 20. Chest x-ray showed no significant acute illness other than subsegmental atelectasis in the right base. Urinalysis was negative. EKG showed atrial paced rhythm with T-wave inversion in lateral leads. Given severely abnormal d-dimer, lower extremity venous Doppler and CT of the chest was  obtained and is currently pending. Cardiology has been consulted for elevated troponin.  Home Medications  Prior to Admission medications   Medication Sig Start Date End Date Taking? Authorizing Provider  amLODipine (NORVASC) 10 MG tablet Take 0.5 tablets (5 mg total) by mouth daily after supper. Patient taking differently: Take 10 mg by mouth daily after supper.  07/16/14  Yes Lars Masson, MD  aspirin 81 MG EC tablet Take 1 tablet (81 mg total) by mouth daily. Swallow whole. DO NOT RESUME UNTIL 12/29 due to Hemorrhage in your brain. Patient taking differently: Take 81 mg by mouth at bedtime.  12/03/14  Yes Layne Benton, NP  atorvastatin (LIPITOR) 80 MG tablet Take 40 mg by mouth daily at 6 PM.    Yes Historical Provider, MD  clopidogrel (PLAVIX) 75 MG tablet Take 75 mg by mouth at bedtime.    Yes Historical Provider, MD  feeding supplement (BOOST  HIGH PROTEIN) LIQD Take 1 Container by mouth 3 (three) times daily between meals.   Yes Historical Provider, MD  furosemide (LASIX) 20 MG tablet Take 20 mg by mouth at bedtime.  03/22/15  Yes Historical Provider, MD  isosorbide mononitrate (IMDUR) 60 MG 24 hr tablet Take 1 tablet (60 mg total) by mouth at bedtime. 07/16/14  Yes Lars Masson, MD  metoprolol (TOPROL-XL) 200 MG 24 hr tablet Take 0.5 tablets (100 mg total) by mouth every morning. Patient taking differently: Take 50 mg by mouth at bedtime.  07/16/14  Yes Lars Masson, MD  Tamsulosin HCl (FLOMAX) 0.4 MG CAPS Take 0.4 mg by mouth daily after supper.    Yes Historical Provider, MD  traMADol (ULTRAM) 50 MG tablet Take 50 mg by mouth 3 (three) times daily as needed for severe pain.   Yes Historical Provider, MD  feeding supplement, ENSURE COMPLETE, (ENSURE COMPLETE) LIQD Take 237 mLs by mouth 2 (two) times daily between meals. Patient not taking: Reported on 05/13/2015 01/05/14   Renae Fickle, MD  nitroGLYCERIN (NITROSTAT) 0.4 MG SL tablet Place 1 tablet (0.4 mg total) under the  tongue every 5 (five) minutes as needed. For chest pain 06/11/14   Hollice Espy, MD  potassium chloride (K-DUR) 10 MEQ tablet Take 1 tablet (10 mEq total) by mouth daily. Patient not taking: Reported on 05/07/2015 07/17/14   Lars Masson, MD    Family History  Family History  Problem Relation Age of Onset  . Cancer Brother 60    oral   . Prostate cancer Brother   . Hypertension Brother   . Hyperlipidemia Brother   . Lung disease Father     black lung disease    Social History  History   Social History  . Marital Status: Widowed    Spouse Name: N/A  . Number of Children: 4  . Years of Education: N/A   Occupational History  . retired     Advice worker, Control and instrumentation engineer   Social History Main Topics  . Smoking status: Current Every Day Smoker -- 0.50 packs/day for 68 years    Types: Cigarettes  . Smokeless tobacco: Former Neurosurgeon    Quit date: 06/02/2011     Comment: 05/07/15 smokes 1/3 PPD, trying to quit  . Alcohol Use: No  . Drug Use: No  . Sexual Activity: Yes   Other Topics Concern  . Not on file   Social History Narrative   Lives in [place], spouse is now deceased.  Receives most care at the [facility].   4 children, 6 grandchildren     Review of Systems General:  No chills, fever, night sweats or weight changes.  Cardiovascular:  No chest pain, dyspnea on exertion, edema, orthopnea, palpitations, paroxysmal nocturnal dyspnea. Dermatological: No rash, lesions/masses Respiratory: No cough, dyspnea Urologic: No hematuria, dysuria Abdominal:   No diarrhea, bright red blood per rectum, melena, or hematemesis +nausea, vomiting Neurologic:  No visual changes, changes in mental status. +wkns All other systems reviewed and are otherwise negative except as noted above.  Physical Exam  Blood pressure 179/91, pulse 82, temperature 98.6 F (37 C), temperature source Oral, resp. rate 21, SpO2 95 %.  General: Pleasant, NAD Psych: Normal affect. Neuro: Alert and oriented  X 3. Moves all extremities spontaneously. HEENT: Normal  Neck: Supple without bruits or JVD. Lungs:  Resp regular and unlabored, CTA. Heart: RRR no s3, s4, or murmurs. L pectoral PPM present Abdomen: Soft, non-distended, BS + x  4. +Abdominal bruits, LLQ pain on deep palpation Extremities: No clubbing, cyanosis or edema. Radials 2+ and equal bilaterally. 1+ L DP pulse, 0+ R DP pulse  Labs  Troponin (Point of Care Test)  Recent Labs  05/13/15 1221  TROPIPOC 0.14*    Recent Labs  05/13/15 1000  TROPONINI 0.17*   Lab Results  Component Value Date   WBC 11.0* 05/13/2015   HGB 15.5 05/13/2015   HCT 45.1 05/13/2015   MCV 94.5 05/13/2015   PLT 145* 05/13/2015     Recent Labs Lab 05/13/15 1000  NA 140  K 3.4*  CL 103  CO2 24  BUN 15  CREATININE 1.42*  CALCIUM 8.7*  PROT 7.1  BILITOT 0.4  ALKPHOS 103  ALT 13*  AST 21  GLUCOSE 102*   Lab Results  Component Value Date   CHOL 101 07/06/2011   HDL 39* 07/06/2011   LDLCALC 50 07/06/2011   TRIG 61 07/06/2011   Lab Results  Component Value Date   DDIMER >20.00* 05/13/2015     Radiology/Studies  Ct Angio Chest Pe W/cm &/or Wo Cm  05/13/2015   CLINICAL DATA:  Nausea, vomiting, generalized weakness and dizziness. Onset this morning.  EXAM: CT ANGIOGRAPHY CHEST WITH CONTRAST  TECHNIQUE: Multidetector CT imaging of the chest was performed using the standard protocol during bolus administration of intravenous contrast. Multiplanar CT image reconstructions and MIPs were obtained to evaluate the vascular anatomy.  CONTRAST:  80mL OMNIPAQUE IOHEXOL 350 MG/ML SOLN  COMPARISON:  11/07/2003  FINDINGS: CT CHEST FINDINGS  Mediastinum/Nodes: There is cardiomegaly. Diffuse coronary artery calcifications. Prior CABG.  There is dilatation of the entire descending thoracic aorta, largest in the mid and distal descending thoracic aorta where the aorta measures up to 5.1 cm. This is new since 2004.  No mediastinal, hilar, or axillary  adenopathy. No filling defects in the pulmonary artery to suggest pulmonary emboli.  Lungs/Pleura: Small left pleural effusion and trace right pleural effusion. Ground-glass opacities dependently in the lower lobes could reflect atelectasis or edema. Underlying COPD/emphysema.  Chest wall: Chest wall soft tissues are unremarkable.  Upper abdomen: No acute findings in the visualized upper abdomen.  Musculoskeletal: No acute bony abnormality or focal bone lesion.  Review of the MIP images confirms the above findings.  IMPRESSION: Diffuse descending thoracic aortic aneurysm, the largest distally, measuring up to 5.1 cm. Recommend vascular surgery referral and consultation if not already performed.  Cardiomegaly. Small bilateral effusions. Ground-glass opacities in the lungs could reflect edema or atelectasis.  Underlying emphysema.   Electronically Signed   By: Charlett NoseKevin  Dover M.D.   On: 05/13/2015 15:01   Dg Chest Port 1 View  05/13/2015   CLINICAL DATA:  Fatigue, nausea, vomiting, history hypertension, coronary artery disease, hyperlipidemia, COPD, stroke, chronic systolic CHF  EXAM: PORTABLE CHEST - 1 VIEW  COMPARISON:  Portable exam 1004 hours compared to 11/19/2014  FINDINGS: LEFT subclavian transvenous pacemaker leads project over RIGHT atrium and RIGHT ventricle unchanged.  Enlargement of cardiac silhouette post CABG.  Atherosclerotic calcification and tortuosity of thoracic aorta.  Pulmonary vascularity normal.  Mild rotation to the LEFT.  Subsegmental atelectasis RIGHT base.  No gross infiltrate, pleural effusion or pneumothorax.  IMPRESSION: Enlargement of cardiac silhouette post CABG and pacemaker.  Subsegmental atelectasis RIGHT base.   Electronically Signed   By: Ulyses SouthwardMark  Boles M.D.   On: 05/13/2015 10:37    ECG  EKG showed atrial paced rhythm with T-wave inversion in lateral leads.  Echocardiogram 11/18/2014  LV EF:  45%  ------------------------------------------------------------------- Indications:   CVA 436.  ------------------------------------------------------------------- History:  PMH:  Coronary artery disease. Congestive heart failure. Congestive heart failure. Stroke. Chronic obstructive pulmonary disease. Risk factors: Current tobacco use. Hypertension.  ------------------------------------------------------------------- Study Conclusions  - Left ventricle: Abnormal global longitudinal strain -14.5. The cavity size was mildly dilated. Wall thickness was normal. The estimated ejection fraction was 45%. Diffuse hypokinesis. Doppler parameters are consistent with elevated ventricular end-diastolic filling pressure. - Aortic valve: There was mild to moderate regurgitation. - Mitral valve: There was mild regurgitation. - Left atrium: The atrium was mildly to moderately dilated. - Right atrium: The atrium was mildly to moderately dilated. - Atrial septum: No defect or patent foramen ovale was identified. - Tricuspid valve: There was moderate regurgitation. - Pulmonary arteries: PA peak pressure: 35 mm Hg (S).    ASSESSMENT AND PLAN  1. Elevated trop  - no significant change in EKG, TWI in lateral leads appears to be chronic.   - atypical presentation (N/V and weakess, no CP, no SOB), unsure if related to ischemic. Trop only mildly elevated  - discussed with Dr. Allyson Sabal, given significant rise in d-dimer, will admit overnight for obs. If no significant jump in trop and asymptomatic tomorrow, will discharge to have outpatient myoview and ABI   2. N/V  - unclear cause, no sign of arrhtyhmia, Medtronic PPM interrogated, device functioning normally, no atrial or ventricular high rate episode  3. Elevated d-dimer: no PE on CTA  4. AAA: 5.1 cm on CTA of chest   - loud abdominal bruit  - will need inpt vs outpt vascular consult  5. CAD s/p 4v CABG in 2000 (LIMA to LAD,  SVG to diag, SVG to OM4, SVG to PDA)  6. SSS s/p Medtronic PPM (placed by Dr. Jenne Campus 2003 with generator change 08/2010)  7. HTN  8. HLD  9. Hypothyroidism  10. orthostatic hypotension  11. PAD  - some claudication symptom on the RLE, will need outpatient ABI  12. COPD  13. Hemorrhagic CVA 11/2014, plavix initially stopped, however started by [facility] later  Signed, Azalee Course, PA-C 05/13/2015, 3:21 PM   Agree with note by Azalee Course PA-C  Pt admitted with N/V. No CP/SOB. H/O CAD and prior CABG (Pt of Dr DM). Has H/O SSS s/p PTVPM insertion. Interrogation showed no atrial or ventricular tachyarrhythmias. He has slightly + trop and ++ d-dimer but neg CTA. Unclear what caused this. Exam benign. 5.1 cm AAA and upper abd bruit  Which can be evaluated as OP.I do not feel this represents ACS. Given age and mult med problems will observe overnight and send home in AM with plans to perform OP myoview.    Runell Gess, M.D., FACP, [facility], Earl Lagos [facility] [address] [place], [place]  [address]  [phone] 05/13/2015 6:12 PM

## 2015-05-13 NOTE — ED Notes (Signed)
Pt transported to CT ?

## 2015-05-13 NOTE — Progress Notes (Signed)
*  Preliminary Results* Right lower extremity venous duplex completed. Right lower extremity is negative for deep vein thrombosis. There is no evidence of right Baker's cyst.  Incidental finding: There is evidence of dampened monophasic flow in the right posterior tibial and peroneal arteries. The patient refused ABI evaluation.  05/13/2015 1:20 PM  Gertie FeyMichelle Roylee Chaffin, RVT, RDCS, RDMS

## 2015-05-13 NOTE — ED Notes (Signed)
The patient is aware that he needs to give a urine specimen.

## 2015-05-13 NOTE — ED Notes (Addendum)
Pt presents to department for evaluation of nausea/vomiting, dizziness and generalized weakness. Onset this morning. History of MI and pacemaker. Pt is alert and oriented x4. No neurological deficits noted at the time.

## 2015-05-13 NOTE — ED Notes (Signed)
Pt.labs i stat tropion 0.17 shown to dr.goldston

## 2015-05-13 NOTE — ED Notes (Signed)
Critical ISTAT result shown to Dr Criss AlvineGoldston

## 2015-05-13 NOTE — ED Notes (Signed)
Cardiology at bedside.

## 2015-05-13 NOTE — ED Notes (Signed)
Order [facility]

## 2015-05-13 NOTE — ED Notes (Signed)
Patient has gone to [facility].

## 2015-05-14 ENCOUNTER — Other Ambulatory Visit: Payer: Self-pay | Admitting: Cardiology

## 2015-05-14 ENCOUNTER — Other Ambulatory Visit: Payer: Medicare Other

## 2015-05-14 DIAGNOSIS — R778 Other specified abnormalities of plasma proteins: Secondary | ICD-10-CM

## 2015-05-14 DIAGNOSIS — R748 Abnormal levels of other serum enzymes: Secondary | ICD-10-CM | POA: Diagnosis not present

## 2015-05-14 DIAGNOSIS — N183 Chronic kidney disease, stage 3 (moderate): Secondary | ICD-10-CM

## 2015-05-14 DIAGNOSIS — I2510 Atherosclerotic heart disease of native coronary artery without angina pectoris: Secondary | ICD-10-CM

## 2015-05-14 DIAGNOSIS — R7989 Other specified abnormal findings of blood chemistry: Principal | ICD-10-CM

## 2015-05-14 DIAGNOSIS — R0789 Other chest pain: Secondary | ICD-10-CM | POA: Diagnosis not present

## 2015-05-14 DIAGNOSIS — I714 Abdominal aortic aneurysm, without rupture: Secondary | ICD-10-CM

## 2015-05-14 LAB — BASIC METABOLIC PANEL
Anion gap: 7 (ref 5–15)
BUN: 18 mg/dL (ref 6–20)
CALCIUM: 8 mg/dL — AB (ref 8.9–10.3)
CHLORIDE: 106 mmol/L (ref 101–111)
CO2: 27 mmol/L (ref 22–32)
Creatinine, Ser: 1.45 mg/dL — ABNORMAL HIGH (ref 0.61–1.24)
GFR calc non Af Amer: 42 mL/min — ABNORMAL LOW (ref >60)
GFR, EST AFRICAN AMERICAN: 49 mL/min — AB (ref >60)
GLUCOSE: 85 mg/dL (ref 65–99)
Potassium: 3.4 mmol/L — ABNORMAL LOW (ref 3.5–5.1)
Sodium: 140 mmol/L (ref 135–145)

## 2015-05-14 LAB — TROPONIN I
TROPONIN I: 0.16 ng/mL — AB (ref <0.031)
Troponin I: 0.16 ng/mL — ABNORMAL HIGH (ref <0.031)

## 2015-05-14 MED ORDER — ATORVASTATIN CALCIUM 40 MG PO TABS: 40.0000 mg | ORAL_TABLET | Freq: Every day | ORAL | Status: DC

## 2015-05-14 MED ORDER — POTASSIUM CHLORIDE CRYS ER 20 MEQ PO TBCR
40.0000 meq | EXTENDED_RELEASE_TABLET | Freq: Once | ORAL | Status: AC
  Administered 2015-05-14: 40 meq via ORAL
  Filled 2015-05-14: qty 2

## 2015-05-14 NOTE — Progress Notes (Addendum)
Patient Profile: 81 yo AA male with PMH of HTN, HLD, AAA/abdominal bruits, hypothyroidism, orthostatic hypotension, PAD, COPD, SSS s/p Medtronic PPM (placed by Dr. [name] 2003 with generator change 08/2010), CVA and CAD s/p 4v CABG in 2000 (LIMA to LAD, SVG to diag, SVG to OM4, SVG to PDA) present with N/V and weakness and found to have d-dimer >20 and mildly elevated trop. CT of chest negative for PE.   Subjective: Feels better. No further n/v. Denies CP and dyspnea.   Objective: Vital signs in last 24 hours: Temp:  [97.5 F (36.4 C)-98.6 F (37 C)] 97.9 F (36.6 C) (06/08 0500) Pulse Rate:  [68-82] 79 (06/07 1906) Resp:  [11-25] 18 (06/08 0500) BP: (133-190)/(62-105) 133/78 mmHg (06/08 0500) SpO2:  [91 %-98 %] 95 % (06/08 0500) FiO2 (%):  [0 %] 0 % (06/07 1900) Weight:  [133 lb 13.1 oz (60.7 kg)] 133 lb 13.1 oz (60.7 kg) (06/07 1906) Last BM Date: 05/12/15  Intake/Output from previous day: 06/07 0701 - 06/08 0700 In: 460 [P.O.:460] Out: -  Intake/Output this shift:    Medications Current Facility-Administered Medications  Medication Dose Route Frequency Provider Last Rate Last Dose  . acetaminophen (TYLENOL) tablet 650 mg  650 mg Oral Q4H PRN Azalee CourseHao Meng, PA      . amLODipine (NORVASC) tablet 10 mg  10 mg Oral QPC supper Azalee CourseHao Meng, [place]   10 mg at 05/13/15 2048  . aspirin chewable tablet 81 mg  81 mg Oral QHS Azalee CourseHao Meng, PA   81 mg at 05/13/15 2220  . atorvastatin (LIPITOR) tablet 40 mg  40 mg Oral q1800 Azalee CourseHao Meng, [place]      . clopidogrel (PLAVIX) tablet 75 mg  75 mg Oral QHS Azalee CourseHao Meng, PA   75 mg at 05/13/15 2221  . enoxaparin (LOVENOX) injection 40 mg  40 mg Subcutaneous Q24H Azalee CourseHao Meng, PA   40 mg at 05/13/15 2049  . feeding supplement (BOOST HIGH PROTEIN) liquid 237 mL  1 Container Oral TID BM Azalee CourseHao Meng, PA   237 mL at 05/13/15 2000  . furosemide (LASIX) tablet 20 mg  20 mg Oral QHS Azalee CourseHao Meng, PA   20 mg at 05/13/15 2048  . isosorbide mononitrate (IMDUR) 24 hr tablet 60 mg  60 mg  Oral QHS Azalee CourseHao Meng, PA   60 mg at 05/13/15 2220  . metoprolol succinate (TOPROL-XL) 24 hr tablet 50 mg  50 mg Oral QHS Azalee CourseHao Meng, PA   50 mg at 05/13/15 2220  . nitroGLYCERIN (NITROSTAT) SL tablet 0.4 mg  0.4 mg Sublingual Q5 Min x 3 PRN Azalee CourseHao Meng, PA      . ondansetron (ZOFRAN) injection 4 mg  4 mg Intravenous Q6H PRN Azalee CourseHao Meng, PA      . tamsulosin (FLOMAX) capsule 0.4 mg  0.4 mg Oral PC supper Azalee CourseHao Meng, PA   0.4 mg at 05/13/15 2049  . traMADol (ULTRAM) tablet 50 mg  50 mg Oral TID PRN Azalee CourseHao Meng, PA        PE: General appearance: alert, cooperative and no distress Neck: no carotid bruit and no JVD Lungs: clear to auscultation bilaterally Heart: regular rate and rhythm Abdomen: palpable pulsatile abominal mass with bruit Extremities: no LEE Pulses: 2+ and symmetric Skin: warm and dry Neurologic: Grossly normal  Lab Results:   Recent Labs  05/13/15 1000 05/13/15 1942  WBC 11.0* 10.0  HGB 15.5 14.5  HCT 45.1 42.7  PLT 145* 144*   BMET  Recent Labs  05/13/15 1000 05/13/15 1942 05/14/15 0408  NA 140  --  140  K 3.4*  --  3.4*  CL 103  --  106  CO2 24  --  27  GLUCOSE 102*  --  85  BUN 15  --  18  CREATININE 1.42* 1.53* 1.45*  CALCIUM 8.7*  --  8.0*   PT/INR  Recent Labs  05/13/15 1000  LABPROT 14.5  INR 1.11   Cholesterol No results for input(s): CHOL in the last 72 hours. Cardiac Panel (last 3 results)  Recent Labs  05/13/15 1942 05/14/15 0032 05/14/15 0612  TROPONINI 0.18* 0.16* 0.16*    Studies/Results: CT of Chest 05/13/15 FINDINGS: CT CHEST FINDINGS  Mediastinum/Nodes: There is cardiomegaly. Diffuse coronary artery calcifications. Prior CABG.  There is dilatation of the entire descending thoracic aorta, largest in the mid and distal descending thoracic aorta where the aorta measures up to 5.1 cm. This is new since 2004.  No mediastinal, hilar, or axillary adenopathy. No filling defects in the pulmonary artery to suggest pulmonary  emboli.  Lungs/Pleura: Small left pleural effusion and trace right pleural effusion. Ground-glass opacities dependently in the lower lobes could reflect atelectasis or edema. Underlying COPD/emphysema.  Chest wall: Chest wall soft tissues are unremarkable.  Upper abdomen: No acute findings in the visualized upper abdomen.  Musculoskeletal: No acute bony abnormality or focal bone lesion.  Review of the MIP images confirms the above findings.  IMPRESSION: Diffuse descending thoracic aortic aneurysm, the largest distally, measuring up to 5.1 cm. Recommend vascular surgery referral and consultation if not already performed.  Cardiomegaly. Small bilateral effusions. Ground-glass opacities in the lungs could reflect edema or atelectasis.  Underlying emphysema.   Assessment/Plan  Active Problems:   Elevated troponin    1. Elevated Troponin: flat trend 0.18, 0.16, 0.16. EKG nonacute and unchanged compared to prior. Denies any chest pain or dyspnea. OP stress testing recommended.   2. Elevated D-dimer: >20 on admit. CT of chest: "No filling defects in the pulmonary artery to suggest pulmonary emboli". LE venous dopplers negative for DVT.   3. AAA: CT shows diffuse descending thoracic aortic aneurysm, the largest distally, measuring up to 5.1 cm. Further OP eval recommended. BP better controlled this am at 133/78. Smoking cessation advised.   4. Hypokalemia: K 3.4. Will supplement with K-Dur.   5. Renal Insufficieny/ chronic kidney disease stage 3: SCr improving. 1.45 today. 1.53 yesterday.     Brittainy M. Sharol Harness, PA-C 05/14/2015 9:05 AM  Personally seen and examined. Agree with above. OK with outpatient NUC Flat troponin Normal CT chest.  5.1cm AAA. Continue monitor. Vascular consult as outpatient would be helpful.  OK for DC   Donato Schultz, MD

## 2015-05-14 NOTE — Discharge Summary (Signed)
Physician Discharge Summary  Patient ID: [name] MRN: [id number] DOB/AGE: Jul 11, 1927 81 y.o.   Primary Cardiologist: Dr. Delton See  Admit date: 05/13/2015 Discharge date: 05/14/2015  Admission Diagnoses: Elevated Troponin  Discharge Diagnoses:  Active Problems:   Elevated troponin   Discharged Condition: stable  HPI/Hospital Course: The patient is a 81 yo AA male with PMH of HTN, HLD, AAA/abdominal bruits, hypothyroidism, orthostatic hypotension, PAD, COPD, SSS s/p Medtronic PPM (placed by Dr. Jenne Campus 2003 with generator change 08/2010), CVA and CAD s/p 4v CABG in 2000 (LIMA to LAD, SVG to diag, SVG to OM4, SVG to PDA). His last cardiac catheterization on 01/17/2014 showed severe disease in the body of SVG to OM status post successful PTCA/bare metal stent, chronically occluded RCA, aneurysmal SVG to diag, patent LIMA to LAD. As for his previous anginal symptom, he never had any chest pain, his anginal symptom has all been shortness of breath. He was last seen by Dr. Delton See in August 2015 at which time he was doing well. He has been doing well at home. He's been fairly active given his age. He denies any recent exertional chest discomfort, shortness breath, lower extremity edema, orthopnea or paroxysmal nocturnal dyspnea. He was admitted to neuro ICU in December 2015 with new onset numbness involving right arm and leg. CT of the head at that time showed small intraparenchymal hemorrhage involving the left centrum semi-ovale. He was continued on aspirin and Plavix was discontinued. His cardiologist at [facility] eventually restarted him on Plavix as well.  He presented to [facility] on 05/12/14 with complaints of sudden onset of malaise, nausea and vomiting. He denied any chest pain or dyspnea. His symptoms prompted him to report to the ER. While in the ER, he had another episode of vomiting. Significant laboratory findings included creatinine of 1.42, troponin 0.17, white blood cell 11, d-dimer > 20. Chest  x-ray showed no significant acute illness other than subsegmental atelectasis in the right base. Urinalysis was negative. EKG showed atrial paced rhythm with T-wave inversion in lateral leads. Given severely abnormal d-dimer, lower extremity venous Dopplers and CT of the chest were obtained. Venous dopplers were negative for DVT. CT of the chest was negative for PE. Additional notable findings on CT scan included a 5.1 cm AAA. Outpatient surgical consultation was recommended. Cardiac enzymes were also cycled and were mildly elevated but with a flat trend (0.18, 0.16, 0.16). It was felt that this did not represent ACS. Given age and multiple med problems, the decision was made to discharge home and perform an OP myoview. He was last seen and examined by Dr. Anne Fu, who determined he was stable for discharge home. Smoking cessation was advised. Referral to vascular surgery and an order for NST were placed. He will f/u with Dr. Delton See.     Consults: None  Significant Diagnostic Studies:  CT-scan of the chest 05/13/15 Mediastinum/Nodes: There is cardiomegaly. Diffuse coronary artery calcifications. Prior CABG.  There is dilatation of the entire descending thoracic aorta, largest in the mid and distal descending thoracic aorta where the aorta measures up to 5.1 cm. This is new since 2004.  No mediastinal, hilar, or axillary adenopathy. No filling defects in the pulmonary artery to suggest pulmonary emboli.  Lungs/Pleura: Small left pleural effusion and trace right pleural effusion. Ground-glass opacities dependently in the lower lobes could reflect atelectasis or edema. Underlying COPD/emphysema.  Chest wall: Chest wall soft tissues are unremarkable.  Upper abdomen: No acute findings in the visualized upper abdomen.  Musculoskeletal: No  acute bony abnormality or focal bone lesion.  Review of the MIP images confirms the above findings.  IMPRESSION: Diffuse descending thoracic aortic aneurysm,  the largest distally, measuring up to 5.1 cm. Recommend vascular surgery referral and consultation if not already performed.  Cardiomegaly. Small bilateral effusions. Ground-glass opacities in the lungs could reflect edema or atelectasis.  Underlying emphysema.  Venous Dopplers 05/13/15  Summary:  - No evidence of deep vein thrombosis involving the right lower extremity. - No evidence of Baker&'s cyst on the right. - Incidental finding: The right posterior tibial and peroneal arteries exhibit dampened monophasic flow. The patient refused ABI evaluation.  Cardiac Panel (last 3 results)  Recent Labs  05/13/15 1942 05/14/15 0032 05/14/15 0612  TROPONINI 0.18* 0.16* 0.16*     Treatments: See Hospital Course  Discharge Exam: Blood pressure 133/78, pulse 79, temperature 97.9 F (36.6 C), temperature source Oral, resp. rate 18, height 5\' 9"  (1.753 m), weight 133 lb 13.1 oz (60.7 kg), SpO2 95 %.  Disposition: 01-Home or Self Care      Discharge Instructions    Diet - low sodium heart healthy    Complete by:  As directed      Increase activity slowly    Complete by:  As directed             Medication List    TAKE these medications        amLODipine 10 MG tablet  Commonly known as:  NORVASC  Take 0.5 tablets (5 mg total) by mouth daily after supper.     aspirin 81 MG EC tablet  Take 1 tablet (81 mg total) by mouth daily. Swallow whole. DO NOT RESUME UNTIL 12/29 due to Hemorrhage in your brain.     atorvastatin 40 MG tablet  Commonly known as:  LIPITOR  Take 1 tablet (40 mg total) by mouth daily at 6 PM.     clopidogrel 75 MG tablet  Commonly known as:  PLAVIX  Take 75 mg by mouth at bedtime.     feeding supplement Liqd  Take 1 Container by mouth 3 (three) times daily between meals.     feeding supplement (ENSURE COMPLETE) Liqd  Take 237 mLs by mouth 2 (two) times daily between meals.     furosemide 20 MG tablet  Commonly known as:  LASIX  Take  20 mg by mouth at bedtime.     isosorbide mononitrate 60 MG 24 hr tablet  Commonly known as:  IMDUR  Take 1 tablet (60 mg total) by mouth at bedtime.     metoprolol 200 MG 24 hr tablet  Commonly known as:  TOPROL-XL  Take 0.5 tablets (100 mg total) by mouth every morning.     nitroGLYCERIN 0.4 MG SL tablet  Commonly known as:  NITROSTAT  Place 1 tablet (0.4 mg total) under the tongue every 5 (five) minutes as needed. For chest pain     potassium chloride 10 MEQ tablet  Commonly known as:  K-DUR  Take 1 tablet (10 mEq total) by mouth daily.     tamsulosin 0.4 MG Caps capsule  Commonly known as:  FLOMAX  Take 0.4 mg by mouth daily after supper.     traMADol 50 MG tablet  Commonly known as:  ULTRAM  Take 50 mg by mouth 3 (three) times daily as needed for severe pain.       Follow-up Information    Follow up with Delorse LekBURNETT,BRENT A, MD.   Specialty:  Family Medicine  Why:  Appointment on June 16th @ 1;15pm   Contact information:   [address] [place] [address] [phone]       Follow up with Lars Masson, MD.   Specialty:  Cardiology   Why:  our office will call you with a follow-up appointment Appointment on June 22 @ 9;30am   Contact information:   [address] [place] [address] [phone]       Follow up with [facility].   Why:  someone will call you for surgical consultation regarding your abdominal aneursym    Contact information:   [address] [place] [address]      TIME SPENT ON DISCHARGE, INCLUDING PHYSICIAN TIME: >30 MINUTES  Signed: Robbie Lis 05/14/2015, 12:40 PM

## 2015-05-15 ENCOUNTER — Telehealth: Payer: Self-pay | Admitting: Cardiovascular Disease

## 2015-05-15 NOTE — Telephone Encounter (Signed)
Pt will need a post hosp f/u call  He will be seeing Nada Boozer on 6/22 @ 9:30 at the [facility] location

## 2015-05-15 NOTE — Telephone Encounter (Signed)
Patient contacted regarding discharge from [facility] on 05/14/15.  Patient understands to follow up with provider Nada Boozer NP on 05/28/15 at 9:30 AM at [facility]. Patient understands discharge instructions? YES Patient understands medications and regiment? yes  Patient understands to bring all medications to this visit? yes   PATIENT IS ALSO AWARE HE HAS AN APPOINTMENT TO HAVE A STRESS TEST-05/22/15 AT [facility].

## 2015-05-16 ENCOUNTER — Telehealth: Payer: Self-pay | Admitting: Vascular Surgery

## 2015-05-16 NOTE — Telephone Encounter (Signed)
-----   Message from Phillips Odor, RN sent at 05/14/2015  1:38 PM EDT ----- Regarding: new referral Rec'd call from [place] @ [facility]; stated Dr. Anne Fu referred pt. for outpatient surgical consult for AAA of 5.1 cm.  Requested appt. next available. Stated pt. is stable.  Discharging home today from hospital. Please call pt. to schedule.

## 2015-05-16 NOTE — Telephone Encounter (Signed)
Spoke with pt to schedule, dpm °

## 2015-05-20 ENCOUNTER — Telehealth (HOSPITAL_COMMUNITY): Payer: Self-pay | Admitting: *Deleted

## 2015-05-20 NOTE — Telephone Encounter (Signed)
Patient given detailed instructions per Myocardial Perfusion Study Information Sheet for test on 05/22/15 at 0900. Patient Notified to arrive 15 minutes early, and that it is imperative to arrive on time for appointment to keep from having the test rescheduled. Patient verbalized understanding. [name], [name]

## 2015-05-22 ENCOUNTER — Ambulatory Visit (HOSPITAL_COMMUNITY): Payer: Medicare Other | Attending: Internal Medicine

## 2015-05-22 DIAGNOSIS — R9439 Abnormal result of other cardiovascular function study: Secondary | ICD-10-CM | POA: Diagnosis not present

## 2015-05-22 DIAGNOSIS — I2510 Atherosclerotic heart disease of native coronary artery without angina pectoris: Secondary | ICD-10-CM | POA: Diagnosis present

## 2015-05-22 LAB — MYOCARDIAL PERFUSION IMAGING
CHL CUP NUCLEAR SRS: 8
CHL CUP NUCLEAR SSS: 11
CHL CUP RESTING HR STRESS: 80 {beats}/min
LV dias vol: 161 mL
LVSYSVOL: 110 mL
Nuc Stress EF: 32 %
Peak HR: 80 {beats}/min
RATE: 0.27
SDS: 3
TID: 1.01

## 2015-05-22 MED ORDER — TECHNETIUM TC 99M SESTAMIBI GENERIC - CARDIOLITE
11.0000 | Freq: Once | INTRAVENOUS | Status: AC | PRN
  Administered 2015-05-22: 11 via INTRAVENOUS

## 2015-05-22 MED ORDER — TECHNETIUM TC 99M SESTAMIBI GENERIC - CARDIOLITE
33.0000 | Freq: Once | INTRAVENOUS | Status: AC | PRN
  Administered 2015-05-22: 33 via INTRAVENOUS

## 2015-05-22 MED ORDER — REGADENOSON 0.4 MG/5ML IV SOLN
0.4000 mg | Freq: Once | INTRAVENOUS | Status: AC
  Administered 2015-05-22: 0.4 mg via INTRAVENOUS

## 2015-05-28 ENCOUNTER — Encounter: Payer: Self-pay | Admitting: Cardiology

## 2015-05-28 ENCOUNTER — Ambulatory Visit (INDEPENDENT_AMBULATORY_CARE_PROVIDER_SITE_OTHER): Payer: Medicare Other | Admitting: Cardiology

## 2015-05-28 VITALS — BP 140/80 | HR 71 | Ht 69.0 in | Wt 139.6 lb

## 2015-05-28 DIAGNOSIS — Z8673 Personal history of transient ischemic attack (TIA), and cerebral infarction without residual deficits: Secondary | ICD-10-CM

## 2015-05-28 DIAGNOSIS — I25810 Atherosclerosis of coronary artery bypass graft(s) without angina pectoris: Secondary | ICD-10-CM | POA: Diagnosis not present

## 2015-05-28 DIAGNOSIS — R112 Nausea with vomiting, unspecified: Secondary | ICD-10-CM

## 2015-05-28 DIAGNOSIS — N183 Chronic kidney disease, stage 3 unspecified: Secondary | ICD-10-CM

## 2015-05-28 DIAGNOSIS — I509 Heart failure, unspecified: Secondary | ICD-10-CM | POA: Diagnosis not present

## 2015-05-28 DIAGNOSIS — I2510 Atherosclerotic heart disease of native coronary artery without angina pectoris: Secondary | ICD-10-CM | POA: Diagnosis not present

## 2015-05-28 MED ORDER — ISOSORBIDE MONONITRATE ER 30 MG PO TB24: 30.0000 mg | ORAL_TABLET | Freq: Every day | ORAL | Status: DC

## 2015-05-28 NOTE — Patient Instructions (Signed)
Medication Instructions:  Your physician has recommended you make the following change in your medication: Decrease Isosorbide mononitrate to 30 mg by mouth daily   Labwork: none  Testing/Procedures: none  Follow-Up: Your physician recommends that you schedule a follow-up appointment in: 2 months with Dr. Delton See

## 2015-05-28 NOTE — Progress Notes (Signed)
Cardiology Office Note   Date:  05/28/2015   ID:  [name], DOB 06-18-1927, MRN [id number]  PCP:  Louis Lek, MD  Cardiologist:  [name] [name]/[name] follow up    History of Present Illness: [name] is a 81 y.o. male who presents for follow up of hospitalization 05/13/2015-05/14/2015. PMH of HTN, HLD, AAA/abdominal bruits, hypothyroidism, orthostatic hypotension, PAD, COPD, SSS s/p Medtronic PPM (placed by [name] with generator change 08/2010), CVA and CAD s/p 4v CABG in 2000 (LIMA to LAD, SVG to diag, SVG to OM4, SVG to PDA).His last cardiac catheterization on 01/17/2014 showed severe disease in the body of SVG to OM status post successful PTCA/bare metal stent, chronically occluded RCA, aneurysmal SVG to diag, patent LIMA to LAD. As for his previous anginal symptom, he never had any chest pain, his anginal symptom has all been shortness of breath. He was last seen by [name] in August 2015 at which time he was doing well. He has been doing well at home. He's been fairly active given his age. He denies any recent exertional chest discomfort, shortness breath, lower extremity edema, orthopnea or paroxysmal nocturnal dyspnea. He was admitted to neuro ICU in December 2015 with new onset numbness involving right arm and leg. CT of the head at that time showed small intraparenchymal hemorrhage involving the left centrum semi-ovale. He was continued on aspirin and Plavix was discontinued. His cardiologist at [name] eventually restarted him on Plavix as well.   [name] had been waiting for friends at a local cafeteria on 05/13/2015 with plans to have breakfast when he developed significant nausea,vomiting, subsequent weakness and dizziness. He denied any chest pain or shortness of breath. He was taken to [name] for further workup. While in the ED, he another episode of vomiting. [name] now brings in his bottle of acetaminophen stating that this is what he  believes caused his nausea and vomiting. He had taken 2- 325 mg tablets that morning. He is advised not to take any more of this.  His initial lab work included a creatinine of 1.42, WBC 11, d-dimer >20. CXR showed no significant acute illness other then subsegmental atelectasis in the right base. Urinalysis was negative. EKG showed atrial paced rhythm with inverted Twave in lateral leads. A CT angio on 05/13/2015 was negative for PE, but showed Diffuse descending thoracic aortic aneurysm, the largest distally, measuring up to 5.1 cm, cardiomegaly, small bilateral effusions, ground-glass opacities in the lungs could reflect edema or atelectasis, and underlying emphysema. A surgical consult was recommended. Pt has an appointment to [name] [name], [name]. A venous doppler study on 05/13/2015 showed no evidence of DVT involving the right lower extremity. An incidental finding was a dampened monophasic flow of the tibial and peroneal arteries. The patient refused an ABI evaluation.   A myocardial perfusion imaging study done on 05/22/2015 showed a small area of inferolateral wall infarct at base and small apical infarct, no ischemia, LV dilated, EF 32%, and inferior and apical hypokinesis. The study was labeled high risk based on multiple infarct areas and low EF. This is not significantly changed from 12/2013.  His troponins were cycled showing slightly elevated, but stable values of .17, .14, .18, .16, .16. It was felt that this did not represent ACS.  His BNP was 4549.0.  Louis. [name] has been doing well since hospitalization except for his common complaint of lightheadedness to the point of passing out. This  occurs not upon first standing but after rising and walking about 20 feet. The last episode was about 2 weeks ago.   Louis. [name] continues to smoke; a pack lasts about 3-4 days. He has little confidence that he can quit. Though it was discussed with him.  Not sure he would apply Nicoderm patches and not  smoke.  We discussed decreasing amount of tobacco.        Past Medical History  Diagnosis Date  . Hypertension   . CAD (coronary artery disease)     a. s/p MI in 1988;  b. s/p CABG in 2000 ([name]) x 4, LIMA to LAD, SVG to diagonal, SVG to [name] SVG to PDA;  c. 12/2013 NSTEMI/abnl MV;  d. 01/2014 Cath/PCI: native 3VD, VG->RCA 100, VG->OM aneurysmal 40ost/m, 6m(5.0x12 Veriflex BMS), VG->Diag aneurysmal, LIMA->LAD nl.  . TIA (transient ischemic attack)   . Hyperlipidemia   . COPD (chronic obstructive pulmonary disease)   . CVA (cerebral infarction) 07/2011    Remote left brain infarct with sensory deficits and had a left internal carotid stent placed by [name] at that time, no recurrent CVA or TIAs or CNS events.  Marland Kitchen PAD (peripheral artery disease)     Stable. Had a past RFPBG by [name] in 2009. this graft is occluded with reconstitution in the popliteal artery, which was patent. Right tibial was occluded and 2-vessel runoff on angiography done by [name] in 12/2010.  . Sick sinus syndrome     PPM implanted for this and syncope   . Chronic systolic CHF (congestive heart failure)     Hospitalization for CHF from 05/04/13-05/06/13. Treated medically. Had possible pneumonia with short course of antibiotics.  . Abdominal bruit     Loud  . AAA (abdominal aortic aneurysm)     Has a known AAA of 3.2 x 3.4 cm by abdominal untrasound 03/27/12. Followup abdominal ultrasound on 03/29/13 showed a diameter of 3.6 cm, which is stable  . Peripheral vascular disease   . Hypothyroid     On supplement "don't know if I take RX or not for my thyroid" (01/16/2014)  . Stroke     Past Surgical History  Procedure Laterality Date  . Femoral bypass Right ?2001  . Carotid stent Left 06/23/11    By [name]  . Coronary artery bypass graft  2000    1st in 2000 ([name]) x 4, LIMA to LAD, SVG to diagonal, SVG to OM4, SVG to PDA. Re-cath in May 2012 with patent LIMA to LAD, patent SVG to diagonal,  patent SVG to OM with left-to-right collaterals to an occluded right.  . Coronary angioplasty with stent placement  2001-01/16/2014    "think today makes #4" (01/16/2014)  . Cardiac catheterization  2000  . Inguinal hernia repair Right 1980's  . Insert / replace / remove pacemaker      implanted [name] by Louis Jenne [name] with gen change (MDT [id number]) [facility]  . Cataract extraction w/ intraocular lens  implant, bilateral Bilateral   . Abdominal aortagram N/A 02/22/2012    Procedure: ABDOMINAL AORTAGRAM;  Surgeon: Nada Libman, MD;  Location: [name] CATH LAB;  Service: Cardiovascular;  Laterality: N/A;  . Carotid angiogram Bilateral 02/22/2012    Procedure: CAROTID ANGIOGRAM;  Surgeon: Nada Libman, MD;  Location: [name] CATH LAB;  Service: Cardiovascular;  Laterality: Bilateral;  . Left heart catheterization with coronary/graft angiogram N/A 01/16/2014    Procedure: LEFT HEART CATHETERIZATION WITH CORONARY/GRAFT ANGIOGRAM;  Surgeon:  Kathleene Hazel, MD;  Location: [name] CATH LAB;  Service: Cardiovascular;  Laterality: N/A;     Current Outpatient Prescriptions  Medication Sig Dispense Refill  . acetaminophen (TYLENOL) 325 MG tablet Take 650 mg by mouth every 12 (twelve) hours.    Marland Kitchen amLODipine (NORVASC) 10 MG tablet Take 0.5 mg by mouth daily after supper.    Marland Kitchen aspirin 325 MG tablet Take 325 mg by mouth daily.    Marland Kitchen atorvastatin (LIPITOR) 40 MG tablet Take 1 tablet (40 mg total) by mouth daily at 6 PM. 30 tablet 5  . clopidogrel (PLAVIX) 75 MG tablet Take 75 mg by mouth at bedtime.     . feeding supplement (BOOST HIGH PROTEIN) LIQD Take 1 Container by mouth 3 (three) times daily between meals.    . feeding supplement, ENSURE COMPLETE, (ENSURE COMPLETE) LIQD Take 237 mLs by mouth 2 (two) times daily between meals. 60 Bottle 0  . furosemide (LASIX) 20 MG tablet Take 20 mg by mouth at bedtime.   3  . isosorbide mononitrate (IMDUR) 60 MG 24 hr tablet Take 1 tablet (60 mg total) by mouth at bedtime. 90  tablet 6  . metoprolol (TOPROL-XL) 200 MG 24 hr tablet Take 100 mg by mouth daily.    . nitroGLYCERIN (NITROSTAT) 0.4 MG SL tablet Place 1 tablet (0.4 mg total) under the tongue every 5 (five) minutes as needed. For chest pain 20 tablet 12  . potassium chloride (K-DUR,KLOR-CON) 10 MEQ tablet Take 10 mEq by mouth once.    . Tamsulosin HCl (FLOMAX) 0.4 MG CAPS Take 0.4 mg by mouth daily after supper.     . traMADol (ULTRAM) 50 MG tablet Take 50 mg by mouth 3 (three) times daily as needed for severe pain.     No current [name]-administered medications for this visit.    Allergies:   Pravachol; Simvastatin; and Terazosin    Social History:  The patient  reports that he has been smoking Cigarettes.  He has a 34 pack-year smoking history. He quit smokeless tobacco use about 3 years ago. He reports that he does not drink alcohol or use illicit drugs.   Family History:  The patient's family history includes Cancer (age of onset: 57) in his brother; Hyperlipidemia in his brother; Hypertension in his brother; Lung disease in his father; Prostate cancer in his brother.    ROS:  General:no colds or fevers, no weight changes Skin:no rashes or ulcers HEENT:no blurred vision, no congestion CV:[name] HPI PUL:[name] HPI GI:no diarrhea constipation or melena, no indigestion GU:no hematuria, no dysuria MS:no joint pain, no claudication Neuro: [name] above Endo:no diabetes, no thyroid disease  Wt Readings from Last 3 Encounters:  05/28/15 139 lb 9.6 oz (63.322 kg)  05/22/15 141 lb (63.957 kg)  05/13/15 133 lb 13.1 oz (60.7 kg)     PHYSICAL EXAM: VS:  BP 140/80 mmHg  Pulse 71  Ht 5\' 9"  (1.753 m)  Wt 139 lb 9.6 oz (63.322 kg)  BMI 20.61 kg/m2  SpO2 97% , BMI Body mass index is 20.61 kg/(m^2). General:Pleasant affect, NAD Skin:Warm and dry, brisk capillary refill HEENT:normocephalic, sclera clear, mucus membranes moist, +arcus senilis  Neck:supple, no JVD, no bruits  Heart:S1S2 RRR  With occ  irregular beat, without murmur, gallup, rub or click Lungs:clear without rales, rhonchi, or wheezes ZOX:WRUE, non tender, + BS, +abdominal bruit Ext:no lower ext edema, 2+ pedal pulses, 2+ radial pulses Neuro:alert and oriented X 3, MAE, follows commands, + facial symmetry  Recent Labs: 11/19/2014: Pro B Natriuretic peptide (BNP) 4549.0* 05/13/2015: ALT 13*; Hemoglobin 14.5; Magnesium 2.2; Platelets 144* 05/14/2015: BUN 18; Creatinine, Ser 1.45*; Potassium 3.4*; Sodium 140    Lipid Panel    Component Value Date/Time   CHOL 101 07/06/2011 0635   TRIG 61 07/06/2011 0635   HDL 39* 07/06/2011 0635   CHOLHDL 2.6 07/06/2011 0635   VLDL 12 07/06/2011 0635   LDLCALC 50 07/06/2011 0635       Other studies Reviewed: Additional studies/ records that were reviewed today include:  CT-scan of the chest 05/13/15 Mediastinum/Nodes: There is cardiomegaly. Diffuse coronary artery calcifications. Prior CABG.  There is dilatation of the entire descending thoracic aorta, largest in the mid and distal descending thoracic aorta where the aorta measures up to 5.1 cm. This is new since 2004.  No mediastinal, hilar, or axillary adenopathy. No filling defects in the pulmonary artery to suggest pulmonary emboli.  Lungs/Pleura: Small left pleural effusion and trace right pleural effusion. Ground-glass opacities dependently in the lower lobes could reflect atelectasis or edema. Underlying COPD/emphysema.  Chest wall: Chest wall soft tissues are unremarkable.  Upper abdomen: No acute findings in the visualized upper abdomen.  Musculoskeletal: No acute bony abnormality or focal bone lesion.  Review of the MIP images confirms the above findings.  IMPRESSION: Diffuse descending thoracic aortic aneurysm, the largest distally, measuring up to 5.1 cm. Recommend vascular Louis referral and consultation if not already performed.  Cardiomegaly. Small bilateral effusions. Ground-glass opacities  in the lungs could reflect edema or atelectasis.  Underlying emphysema.  Venous Dopplers 05/13/15  Summary:  - No evidence of deep vein thrombosis involving the right lower extremity. - No evidence of Baker&'s cyst on the right. - Incidental finding: The right posterior tibial and peroneal arteries exhibit dampened monophasic flow. The patient refused ABI evaluation.   ASSESSMENT AND PLAN:  1. Nausea and vomiting S/P hospitalization for nausea and vomiting and abnormal troponin. Pt feels that acetaminophen may have been the cause a special brand from the [place]. He will stop taking this med. No further N/V. Troponin was only mildly elevated with a flat trend and myocardial perfusion unchanged from 12/2013.   2. Chronic systolic CHF - LVEF 35%. No lower extremity edema or new shortness of breath.  3. Chronic CKD - baseline Crea 1.5, 1.45 at [name] discharge.  4. S/P PPM for symptomatic bradycardia - followed by Louis [name].  5. Hypertension - currently controlled.   6. Orthostatic hypotension with lightheadedness and syncope- He has had his amlodipine decreased in the past for the same reason along with taking his BP lowering meds at bedtime. Will decrease isosorbide today to 30 mg from 60 mg. .    7. TAA for surgical consult with [name].  Current medicines are reviewed with the patient today.  The patient Has no concerns regarding medicines.  The following changes have been made:  [name] above Labs/ tests ordered today include:[name] above  Follow up with [name] in 2 months.  Signed, Margaretha Sheffield, RN, NP student [name] Examination and notes entered under the direct supervision and in the presence of Nada Boozer, FNP-C  I have seen the pt along with Ms. Saranne Crislip, NP-Student and have examined the pt. Independently.  We discussed exam and plan.  I agree with orders and plan.  Will also send to [name] to have her review nuc results.    Nada Boozer, FNP-C [facility].  05/28/2015 10:49 AM    [name] [address] [place], [place], [place]  [address]/ [address] [place], [place] Phone: [phone]; Fax: [phone]  [phone]

## 2015-06-04 ENCOUNTER — Encounter: Payer: Self-pay | Admitting: Vascular Surgery

## 2015-06-10 ENCOUNTER — Other Ambulatory Visit: Payer: Self-pay

## 2015-06-10 ENCOUNTER — Ambulatory Visit (INDEPENDENT_AMBULATORY_CARE_PROVIDER_SITE_OTHER): Payer: Medicare Other | Admitting: Vascular Surgery

## 2015-06-10 ENCOUNTER — Encounter: Payer: Self-pay | Admitting: Vascular Surgery

## 2015-06-10 VITALS — BP 161/88 | HR 76 | Ht 69.0 in | Wt 142.2 lb

## 2015-06-10 DIAGNOSIS — I2510 Atherosclerotic heart disease of native coronary artery without angina pectoris: Secondary | ICD-10-CM

## 2015-06-10 DIAGNOSIS — I739 Peripheral vascular disease, unspecified: Secondary | ICD-10-CM | POA: Diagnosis not present

## 2015-06-10 NOTE — Progress Notes (Signed)
Subjective:     Patient ID: [name], male   DOB: 11/25/1927, 81 y.o.   MRN: [id number]  HPI this 81-year-old male was referred by Dr. Nelson for evaluation of a diffuse descending thoracic aortic aneurysm measuring up to 5.1 cm in maximum diameter. Patient has a remote history of coronary artery bypass grafting and is had PTCA and stent interventions since that time. He is stable from a cardiac standpoint. A recent CT scan revealed diffuse dilatation of the descending thoracic aorta. Patient states that he is having severe right buttock thigh and calf pain with short distance ambulation which is quite severely limiting him. He occasionally will have rest discomfort in the right foot. He has no history of gangrene nonhealing ulcers infection. He does have a pacemaker in place. He continues to smoke a third to a quarter of a pack of cigarettes per day.  Past Medical History  Diagnosis Date  . Hypertension   . CAD (coronary artery disease)     a. s/p MI in 1988;  b. s/p CABG in 2000 (Dr. Owens) x 4, LIMA to LAD, SVG to diagonal, SVG to OM4, SVG to PDA;  c. 12/2013 NSTEMI/abnl MV;  d. 01/2014 Cath/PCI: native 3VD, VG->RCA 100, VG->OM aneurysmal 40ost/m, 99m(5.0x12 Veriflex BMS), VG->Diag aneurysmal, LIMA->LAD nl.  . TIA (transient ischemic attack)   . Hyperlipidemia   . COPD (chronic obstructive pulmonary disease)   . CVA (cerebral infarction) 07/2011    Remote left brain infarct with sensory deficits and had a left internal carotid stent placed by Dr. Deveshwar at that time, no recurrent CVA or TIAs or CNS events.  . PAD (peripheral artery disease)     Stable. Had a past RFPBG by Dr. Early in 2009. this graft is occluded with reconstitution in the popliteal artery, which was patent. Right tibial was occluded and 2-vessel runoff on angiography done by Dr. Brabham in 12/2010.  . Sick sinus syndrome     PPM implanted for this and syncope   . Chronic systolic CHF (congestive heart failure)    Hospitalization for CHF from 05/04/13-05/06/13. Treated medically. Had possible pneumonia with short course of antibiotics.  . Abdominal bruit     Loud  . AAA (abdominal aortic aneurysm)     Has a known AAA of 3.2 x 3.4 cm by abdominal untrasound 03/27/12. Followup abdominal ultrasound on 03/29/13 showed a diameter of 3.6 cm, which is stable  . Peripheral vascular disease   . Hypothyroid     On supplement "don't know if I take RX or not for my thyroid" (01/16/2014)  . Stroke     History  Substance Use Topics  . Smoking status: Current Every Day Smoker -- 0.50 packs/day for 68 years    Types: Cigarettes  . Smokeless tobacco: Former User    Quit date: 06/02/2011     Comment: 05/07/15 smokes 1/3 PPD, trying to quit  . Alcohol Use: No    Family History  Problem Relation Age of Onset  . Cancer Brother 60    oral   . Prostate cancer Brother   . Hypertension Brother   . Hyperlipidemia Brother   . Lung disease Father     black lung disease    Allergies  Allergen Reactions  . Pravachol Other (See Comments)    myalgias  . Simvastatin Other (See Comments)    myalgia  . Terazosin Other (See Comments)    unknown     Current outpatient prescriptions:  .  amLODipine (NORVASC)   10 MG tablet, Take 0.5 mg by mouth daily after supper., Disp: , Rfl:  .  aspirin 325 MG tablet, Take 325 mg by mouth daily., Disp: , Rfl:  .  atorvastatin (LIPITOR) 40 MG tablet, Take 1 tablet (40 mg total) by mouth daily at 6 PM., Disp: 30 tablet, Rfl: 5 .  clopidogrel (PLAVIX) 75 MG tablet, Take 75 mg by mouth at bedtime. , Disp: , Rfl:  .  feeding supplement (BOOST HIGH PROTEIN) LIQD, Take 1 Container by mouth 3 (three) times daily between meals., Disp: , Rfl:  .  feeding supplement, ENSURE COMPLETE, (ENSURE COMPLETE) LIQD, Take 237 mLs by mouth 2 (two) times daily between meals., Disp: 60 Bottle, Rfl: 0 .  furosemide (LASIX) 20 MG tablet, Take 20 mg by mouth at bedtime. , Disp: , Rfl: 3 .  isosorbide mononitrate  (IMDUR) 30 MG 24 hr tablet, Take 1 tablet (30 mg total) by mouth daily., Disp: 30 tablet, Rfl: 3 .  metoprolol (TOPROL-XL) 200 MG 24 hr tablet, Take 100 mg by mouth daily., Disp: , Rfl:  .  nitroGLYCERIN (NITROSTAT) 0.4 MG SL tablet, Place 1 tablet (0.4 mg total) under the tongue every 5 (five) minutes as needed. For chest pain, Disp: 20 tablet, Rfl: 12 .  potassium chloride (K-DUR,KLOR-CON) 10 MEQ tablet, Take 10 mEq by mouth once., Disp: , Rfl:  .  Tamsulosin HCl (FLOMAX) 0.4 MG CAPS, Take 0.4 mg by mouth daily after supper. , Disp: , Rfl:  .  traMADol (ULTRAM) 50 MG tablet, Take 50 mg by mouth 3 (three) times daily as needed for severe pain., Disp: , Rfl:  .  acetaminophen (TYLENOL) 325 MG tablet, Take 650 mg by mouth every 12 (twelve) hours., Disp: , Rfl:   Filed Vitals:   06/10/15 1529 06/10/15 1531  BP: 152/79 161/88  Pulse: 76   Height: 5\' 9"  (1.753 m)   Weight: 142 lb 3.2 oz (64.501 kg)   SpO2: 97%     Body mass index is 20.99 kg/(m^2).         Review of Systems denies active chest pain but does have mild dyspnea on exertion. Has swelling in both lower extremities. Complains of pain in his right leg. Weakness in his right leg. Occasional dizziness. She has had sleep apnea in the past. Has remote history of a left brain CVA. Had a myocardial perfusion study performed in June 2016 with EF of 32% and no ischemia but fixed defects from previous infarcts     Objective:   Physical Exam BP 161/88 mmHg  Pulse 76  Ht 5\' 9"  (1.753 m)  Wt 142 lb 3.2 oz (64.501 kg)  BMI 20.99 kg/m2  SpO2 97%  Gen.-alert and oriented x3 in no apparent distress HEENT normal for age Lungs no rhonchi or wheezing Cardiovascular regular rhythm no murmurs carotid pulses 3+ palpable no bruits audible Abdomen soft nontender no palpable masses Musculoskeletal free of  major deformities Skin clear -no rashes Neurologic normal Lower extremities 3+ femoral and absent popliteal and distal pulses on the  left Absent right femoral and distal pulses on the right. Right foot cooler than left.       Assessment:     1 diffuse dilatation of descending thoracic aorta-up to 5.1 cm in maximum diameter above the diaphragm-no saccular component #2 history coronary artery disease with previous coronary artery bypass grafting in 2000 with recent myocardial perfusion study revealing EF 32% and no active ischemia #3 severe PAD with failed right femoral-popliteal bypass  graft done by Dr. 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 Myra GianottiBrabham on July 19. If not successful patient will need left right femoral-femoral bypass by Dr. early or Hart RochesterLawson

## 2015-06-16 ENCOUNTER — Other Ambulatory Visit: Payer: Self-pay | Admitting: Cardiology

## 2015-06-18 ENCOUNTER — Encounter: Payer: Self-pay | Admitting: *Deleted

## 2015-06-23 MED ORDER — SODIUM CHLORIDE 0.9 % IV SOLN
INTRAVENOUS | Status: DC
Start: 2015-06-24 — End: 2015-06-24
  Administered 2015-06-24: 06:00:00 via INTRAVENOUS

## 2015-06-24 ENCOUNTER — Ambulatory Visit (HOSPITAL_COMMUNITY)
Admission: RE | Admit: 2015-06-24 | Discharge: 2015-06-24 | Disposition: A | Discharge status: Home / Self Care | Payer: Medicare Other | Source: Ambulatory Visit | Attending: Surgery | Admitting: Surgery

## 2015-06-24 ENCOUNTER — Ambulatory Visit (HOSPITAL_COMMUNITY): Payer: Medicare Other

## 2015-06-24 ENCOUNTER — Encounter (HOSPITAL_COMMUNITY)
Admission: RE | Disposition: A | Discharge status: Home / Self Care | Payer: Self-pay | Source: Ambulatory Visit | Attending: Surgery

## 2015-06-24 ENCOUNTER — Other Ambulatory Visit: Payer: Self-pay

## 2015-06-24 ENCOUNTER — Encounter (HOSPITAL_COMMUNITY): Payer: Self-pay | Admitting: *Deleted

## 2015-06-24 DIAGNOSIS — Z7902 Long term (current) use of antithrombotics/antiplatelets: Secondary | ICD-10-CM

## 2015-06-24 DIAGNOSIS — I2510 Atherosclerotic heart disease of native coronary artery without angina pectoris: Secondary | ICD-10-CM | POA: Insufficient documentation

## 2015-06-24 DIAGNOSIS — I5022 Chronic systolic (congestive) heart failure: Secondary | ICD-10-CM | POA: Diagnosis present

## 2015-06-24 DIAGNOSIS — E039 Hypothyroidism, unspecified: Secondary | ICD-10-CM | POA: Diagnosis present

## 2015-06-24 DIAGNOSIS — Z8673 Personal history of transient ischemic attack (TIA), and cerebral infarction without residual deficits: Secondary | ICD-10-CM

## 2015-06-24 DIAGNOSIS — I712 Thoracic aortic aneurysm, without rupture: Secondary | ICD-10-CM | POA: Insufficient documentation

## 2015-06-24 DIAGNOSIS — F17210 Nicotine dependence, cigarettes, uncomplicated: Secondary | ICD-10-CM | POA: Diagnosis present

## 2015-06-24 DIAGNOSIS — Z7982 Long term (current) use of aspirin: Secondary | ICD-10-CM

## 2015-06-24 DIAGNOSIS — Z950 Presence of cardiac pacemaker: Secondary | ICD-10-CM

## 2015-06-24 DIAGNOSIS — Z951 Presence of aortocoronary bypass graft: Secondary | ICD-10-CM | POA: Insufficient documentation

## 2015-06-24 DIAGNOSIS — E785 Hyperlipidemia, unspecified: Secondary | ICD-10-CM | POA: Diagnosis present

## 2015-06-24 DIAGNOSIS — I252 Old myocardial infarction: Secondary | ICD-10-CM

## 2015-06-24 DIAGNOSIS — J449 Chronic obstructive pulmonary disease, unspecified: Secondary | ICD-10-CM | POA: Diagnosis present

## 2015-06-24 DIAGNOSIS — Z955 Presence of coronary angioplasty implant and graft: Secondary | ICD-10-CM | POA: Insufficient documentation

## 2015-06-24 DIAGNOSIS — I745 Embolism and thrombosis of iliac artery: Principal | ICD-10-CM | POA: Diagnosis present

## 2015-06-24 DIAGNOSIS — I70213 Atherosclerosis of native arteries of extremities with intermittent claudication, bilateral legs: Secondary | ICD-10-CM | POA: Insufficient documentation

## 2015-06-24 DIAGNOSIS — Z791 Long term (current) use of non-steroidal anti-inflammatories (NSAID): Secondary | ICD-10-CM

## 2015-06-24 DIAGNOSIS — I70211 Atherosclerosis of native arteries of extremities with intermittent claudication, right leg: Secondary | ICD-10-CM

## 2015-06-24 DIAGNOSIS — I714 Abdominal aortic aneurysm, without rupture: Secondary | ICD-10-CM | POA: Diagnosis present

## 2015-06-24 DIAGNOSIS — Z79899 Other long term (current) drug therapy: Secondary | ICD-10-CM

## 2015-06-24 HISTORY — DX: Presence of cardiac pacemaker: Z95.0

## 2015-06-24 HISTORY — DX: Pneumonia, unspecified organism: J18.9

## 2015-06-24 HISTORY — PX: PERIPHERAL VASCULAR CATHETERIZATION: SHX172C

## 2015-06-24 LAB — COMPREHENSIVE METABOLIC PANEL
ALK PHOS: 88 U/L (ref 38–126)
ALT: 10 U/L — ABNORMAL LOW (ref 17–63)
ANION GAP: 7 (ref 5–15)
AST: 16 U/L (ref 15–41)
Albumin: 3.1 g/dL — ABNORMAL LOW (ref 3.5–5.0)
BUN: 14 mg/dL (ref 6–20)
CO2: 30 mmol/L (ref 22–32)
CREATININE: 1.45 mg/dL — AB (ref 0.61–1.24)
Calcium: 8.7 mg/dL — ABNORMAL LOW (ref 8.9–10.3)
Chloride: 105 mmol/L (ref 101–111)
GFR calc non Af Amer: 42 mL/min — ABNORMAL LOW (ref >60)
GFR, EST AFRICAN AMERICAN: 49 mL/min — AB (ref >60)
Glucose, Bld: 114 mg/dL — ABNORMAL HIGH (ref 65–99)
Potassium: 3.4 mmol/L — ABNORMAL LOW (ref 3.5–5.1)
SODIUM: 142 mmol/L (ref 135–145)
Total Bilirubin: 0.4 mg/dL (ref 0.3–1.2)
Total Protein: 6.1 g/dL — ABNORMAL LOW (ref 6.5–8.1)

## 2015-06-24 LAB — APTT: aPTT: 33 seconds (ref 24–37)

## 2015-06-24 LAB — URINALYSIS, ROUTINE W REFLEX MICROSCOPIC
Bilirubin Urine: NEGATIVE
GLUCOSE, UA: NEGATIVE mg/dL
KETONES UR: NEGATIVE mg/dL
Leukocytes, UA: NEGATIVE
NITRITE: NEGATIVE
PROTEIN: NEGATIVE mg/dL
Specific Gravity, Urine: 1.025 (ref 1.005–1.030)
Urobilinogen, UA: 0.2 mg/dL (ref 0.0–1.0)
pH: 7.5 (ref 5.0–8.0)

## 2015-06-24 LAB — CBC
HCT: 43.2 % (ref 39.0–52.0)
Hemoglobin: 14.7 g/dL (ref 13.0–17.0)
MCH: 31.9 pg (ref 26.0–34.0)
MCHC: 34 g/dL (ref 30.0–36.0)
MCV: 93.7 fL (ref 78.0–100.0)
PLATELETS: 141 10*3/uL — AB (ref 150–400)
RBC: 4.61 MIL/uL (ref 4.22–5.81)
RDW: 15.1 % (ref 11.5–15.5)
WBC: 9 10*3/uL (ref 4.0–10.5)

## 2015-06-24 LAB — POCT I-STAT, CHEM 8
BUN: 21 mg/dL — ABNORMAL HIGH (ref 6–20)
CHLORIDE: 104 mmol/L (ref 101–111)
CREATININE: 1.5 mg/dL — AB (ref 0.61–1.24)
Calcium, Ion: 1.14 mmol/L (ref 1.13–1.30)
Glucose, Bld: 89 mg/dL (ref 65–99)
HCT: 45 % (ref 39.0–52.0)
Hemoglobin: 15.3 g/dL (ref 13.0–17.0)
POTASSIUM: 3.2 mmol/L — AB (ref 3.5–5.1)
SODIUM: 141 mmol/L (ref 135–145)
TCO2: 24 mmol/L (ref 0–100)

## 2015-06-24 LAB — SURGICAL PCR SCREEN
MRSA, PCR: NEGATIVE
Staphylococcus aureus: NEGATIVE

## 2015-06-24 LAB — URINE MICROSCOPIC-ADD ON

## 2015-06-24 LAB — PROTIME-INR
INR: 1.18 (ref 0.00–1.49)
Prothrombin Time: 15.1 seconds (ref 11.6–15.2)

## 2015-06-24 SURGERY — ABDOMINAL AORTOGRAM
Anesthesia: LOCAL

## 2015-06-24 MED ORDER — ONDANSETRON HCL 4 MG/2ML IJ SOLN
4.0000 mg | Freq: Four times a day (QID) | INTRAMUSCULAR | Status: DC | PRN
Start: 2015-06-24 — End: 2015-06-24

## 2015-06-24 MED ORDER — ALUM & MAG HYDROXIDE-SIMETH 200-200-20 MG/5ML PO SUSP
15.0000 mL | ORAL | Status: DC | PRN
  Filled 2015-06-24: qty 30

## 2015-06-24 MED ORDER — LABETALOL HCL 5 MG/ML IV SOLN: 10.0000 mg | INTRAVENOUS | Status: DC | PRN

## 2015-06-24 MED ORDER — FENTANYL CITRATE (PF) 100 MCG/2ML IJ SOLN
INTRAMUSCULAR | Status: AC
  Filled 2015-06-24: qty 2

## 2015-06-24 MED ORDER — METOPROLOL TARTRATE 1 MG/ML IV SOLN: 2.0000 mg | INTRAVENOUS | Status: DC | PRN

## 2015-06-24 MED ORDER — HEPARIN (PORCINE) IN NACL 2-0.9 UNIT/ML-% IJ SOLN
INTRAMUSCULAR | Status: AC
  Filled 2015-06-24: qty 1000

## 2015-06-24 MED ORDER — ACETAMINOPHEN 325 MG RE SUPP
325.0000 mg | RECTAL | Status: DC | PRN
  Filled 2015-06-24: qty 2

## 2015-06-24 MED ORDER — SODIUM CHLORIDE 0.9 % IV SOLN: INTRAVENOUS | Status: DC

## 2015-06-24 MED ORDER — FENTANYL CITRATE (PF) 100 MCG/2ML IJ SOLN
INTRAMUSCULAR | Status: DC | PRN
  Administered 2015-06-24: 25 ug via INTRAVENOUS

## 2015-06-24 MED ORDER — GUAIFENESIN-DM 100-10 MG/5ML PO SYRP
15.0000 mL | ORAL_SOLUTION | ORAL | Status: DC | PRN
  Filled 2015-06-24: qty 15

## 2015-06-24 MED ORDER — ACETAMINOPHEN 325 MG PO TABS
325.0000 mg | ORAL_TABLET | ORAL | Status: DC | PRN
  Filled 2015-06-24: qty 2

## 2015-06-24 MED ORDER — DOCUSATE SODIUM 100 MG PO CAPS: 100.0000 mg | ORAL_CAPSULE | Freq: Every day | ORAL | Status: DC

## 2015-06-24 MED ORDER — LIDOCAINE HCL (PF) 1 % IJ SOLN
INTRAMUSCULAR | Status: AC
  Filled 2015-06-24: qty 30

## 2015-06-24 MED ORDER — HYDRALAZINE HCL 20 MG/ML IJ SOLN: 5.0000 mg | INTRAMUSCULAR | Status: DC | PRN

## 2015-06-24 MED ORDER — SODIUM CHLORIDE 0.9 % IV SOLN: 1.0000 mL/kg/h | INTRAVENOUS | Status: DC

## 2015-06-24 MED ORDER — PHENOL 1.4 % MT LIQD
1.0000 | OROMUCOSAL | Status: DC | PRN
  Filled 2015-06-24: qty 177

## 2015-06-24 MED ORDER — HEPARIN (PORCINE) IN NACL 2-0.9 UNIT/ML-% IJ SOLN
INTRAMUSCULAR | Status: DC | PRN
  Administered 2015-06-24: 08:00:00

## 2015-06-24 MED ORDER — MUPIROCIN 2 % EX OINT: 1.0000 "application " | TOPICAL_OINTMENT | CUTANEOUS | Status: DC

## 2015-06-24 MED ORDER — MIDAZOLAM HCL 2 MG/2ML IJ SOLN
INTRAMUSCULAR | Status: AC
  Filled 2015-06-24: qty 2

## 2015-06-24 MED ORDER — MIDAZOLAM HCL 2 MG/2ML IJ SOLN
INTRAMUSCULAR | Status: DC | PRN
  Administered 2015-06-24: 1 mg via INTRAVENOUS

## 2015-06-24 MED ORDER — IODIXANOL 320 MG/ML IV SOLN
INTRAVENOUS | Status: DC | PRN
  Administered 2015-06-24: 115 mL via INTRA_ARTERIAL

## 2015-06-24 MED ORDER — OXYCODONE HCL 5 MG PO TABS: 5.0000 mg | ORAL_TABLET | ORAL | Status: DC | PRN

## 2015-06-24 MED ORDER — DEXTROSE 5 % IV SOLN
1.5000 g | INTRAVENOUS | Status: AC
  Administered 2015-06-25: 1.5 g via INTRAVENOUS
  Filled 2015-06-24: qty 1.5

## 2015-06-24 SURGICAL SUPPLY — 13 items
CATH OMNI FLUSH 5F 65CM (CATHETERS) ×2 IMPLANT
COVER PRB 48X5XTLSCP FOLD TPE (BAG) ×1 IMPLANT
COVER PROBE 5X48 (BAG) ×1
DEVICE TORQUE H2O (MISCELLANEOUS) ×2 IMPLANT
DRAPE ZERO GRAVITY STERILE (DRAPES) ×2 IMPLANT
GUIDEWIRE ANGLED .035X150CM (WIRE) ×2 IMPLANT
KIT MICROINTRODUCER STIFF 5F (SHEATH) ×2 IMPLANT
KIT PV (KITS) ×2 IMPLANT
SHEATH PINNACLE 5F 10CM (SHEATH) ×2 IMPLANT
SYR MEDRAD MARK V 150ML (SYRINGE) ×2 IMPLANT
TRANSDUCER W/STOPCOCK (MISCELLANEOUS) ×2 IMPLANT
TRAY PV CATH (CUSTOM PROCEDURE TRAY) ×2 IMPLANT
WIRE BENTSON .035X145CM (WIRE) ×2 IMPLANT

## 2015-06-24 NOTE — Pre-Procedure Instructions (Signed)
Fransico HimRaymond Wolven  06/24/2015      [id number] , [place] - [address] [place] [address] Phone: [phone] Fax: [phone]  [facility] [place] - [id number] [place] [id number] Phone: [phone] Fax: [phone]    Your procedure is scheduled on Wednesday, June 25, 2015  [facility] Admitting at 6:30 A.M.  Call this number if you have problems the morning of surgery:  [phone]   Remember:  Do not eat food or drink liquids after midnight.  Take these medicines the morning of surgery with A SIP OF WATER : Nitrostat for chest pain if needed Stop taking vitamins and herbal medications. Do not take any NSAIDs ie: Ibuprofen, Advil, Naproxen and etc.  Do not wear jewelry, make-up or nail polish.  Do not wear lotions, powders, or perfumes.  You may not wear deodorant.  Do not shave 48 hours prior to surgery.  Men may shave face and neck.  Do not bring valuables to the hospital.  [facility] is not responsible for any belongings or valuables.  Contacts, dentures or bridgework may not be worn into surgery.  Leave your suitcase in the car.  After surgery it may be brought to your room.  For patients admitted to the hospital, discharge time will be determined by your treatment team.  Patients discharged the day of surgery will not be allowed to drive home.   Name and phone number of your driver: Special instructions: Special Instructions:Special Instructions: [facility] - Preparing for [facility]  Before surgery, you can play an important role.  Because skin is not sterile, your skin needs to be as free of germs as possible.  You can reduce the number of germs on you skin by washing with CHG (chlorahexidine gluconate) soap before surgery.  CHG is an antiseptic cleaner which kills germs and bonds with the skin to continue killing germs even after  washing.  Please DO NOT use if you have an allergy to CHG or antibacterial soaps.  If your skin becomes reddened/irritated stop using the CHG and inform your nurse when you arrive at Short Stay.  Do not shave (including legs and underarms) for at least 48 hours prior to the first CHG shower.  You may shave your face.  Please follow these instructions carefully:   1.  Shower with CHG Soap the night before surgery and the morning of Surgery.  2.  If you choose to wash your hair, wash your hair first as usual with your normal shampoo.  3.  After you shampoo, rinse your hair and body thoroughly to remove the Shampoo.  4.  Use CHG as you would any other liquid soap.  You can apply chg directly  to the skin and wash gently with scrungie or a clean washcloth.  5.  Apply the CHG Soap to your body ONLY FROM THE NECK DOWN.  Do not use on open wounds or open sores.  Avoid contact with your eyes, ears, mouth and genitals (private parts).  Wash genitals (private parts) with your normal soap.  6.  Wash thoroughly, paying special attention to the area where your surgery will be performed.  7.  Thoroughly rinse your body with warm water from the neck down.  8.  DO NOT shower/wash with your normal soap after using and rinsing off the CHG Soap.  9.  Pat yourself dry with a  clean towel.            10.  Wear clean pajamas.            11.  Place clean sheets on your bed the night of your first shower and do not sleep with pets.  Day of Surgery  Do not apply any lotions/deodorants the morning of surgery.  Please wear clean clothes to the hospital/surgery center.  Please read over the following fact sheets that you were given. Pain Booklet, Coughing and Deep Breathing, Blood Transfusion Information, MRSA Information and Surgical Site Infection Prevention

## 2015-06-24 NOTE — Progress Notes (Signed)
Preop preparation done by Domingo CockingEster, RN.  Instructions faxed from office and shared with family and pt.  Instructed to arrive at Short Stay Surgical at 0630 in am.

## 2015-06-24 NOTE — Progress Notes (Signed)
K+3.2 noted. Md notified. Ok to proceed.

## 2015-06-24 NOTE — H&P (View-Only) (Signed)
Subjective:     Patient ID: [name], male   DOB: 02/14/1927, 81 y.o.   MRN: [id number]  HPI this 81 year old male was referred by Dr. Delton See for evaluation of a diffuse descending thoracic aortic aneurysm measuring up to 5.1 cm in maximum diameter. Patient has a remote history of coronary artery bypass grafting and is had PTCA and stent interventions since that time. He is stable from a cardiac standpoint. A recent CT scan revealed diffuse dilatation of the descending thoracic aorta. Patient states that he is having severe right buttock thigh and calf pain with short distance ambulation which is quite severely limiting him. He occasionally will have rest discomfort in the right foot. He has no history of gangrene nonhealing ulcers infection. He does have a pacemaker in place. He continues to smoke a third to a quarter of a pack of cigarettes per day.  Past Medical History  Diagnosis Date  . Hypertension   . CAD (coronary artery disease)     a. s/p MI in 1988;  b. s/p CABG in 2000 (Dr. Barry Dienes) x 4, LIMA to LAD, SVG to diagonal, SVG to [facility], SVG to PDA;  c. 12/2013 NSTEMI/abnl MV;  d. 01/2014 Cath/PCI: native 3VD, VG->RCA 100, VG->OM aneurysmal 40ost/m, 39m(5.0x12 Veriflex BMS), VG->Diag aneurysmal, LIMA->LAD nl.  . TIA (transient ischemic attack)   . Hyperlipidemia   . COPD (chronic obstructive pulmonary disease)   . CVA (cerebral infarction) 07/2011    Remote left brain infarct with sensory deficits and had a left internal carotid stent placed by Dr. [name] at that time, no recurrent CVA or TIAs or CNS events.  Marland Kitchen PAD (peripheral artery disease)     Stable. Had a past RFPBG by Dr. [name] in 2009. this graft is occluded with reconstitution in the popliteal artery, which was patent. Right tibial was occluded and 2-vessel runoff on angiography done by [name] in 12/2010.  . Sick sinus syndrome     PPM implanted for this and syncope   . Chronic systolic CHF (congestive heart failure)    Hospitalization for CHF from 05/04/13-05/06/13. Treated medically. Had possible pneumonia with short course of antibiotics.  . Abdominal bruit     Loud  . AAA (abdominal aortic aneurysm)     Has a known AAA of 3.2 x 3.4 cm by abdominal untrasound 03/27/12. Followup abdominal ultrasound on 03/29/13 showed a diameter of 3.6 cm, which is stable  . Peripheral vascular disease   . Hypothyroid     On supplement "don't know if I take RX or not for my thyroid" (01/16/2014)  . Stroke     History  Substance Use Topics  . Smoking status: Current Every Day Smoker -- 0.50 packs/day for 68 years    Types: Cigarettes  . Smokeless tobacco: Former Neurosurgeon    Quit date: 06/02/2011     Comment: 05/07/15 smokes 1/3 PPD, trying to quit  . Alcohol Use: No    Family History  Problem Relation Age of Onset  . Cancer Brother 60    oral   . Prostate cancer Brother   . Hypertension Brother   . Hyperlipidemia Brother   . Lung disease Father     black lung disease    Allergies  Allergen Reactions  . Pravachol Other (See Comments)    myalgias  . Simvastatin Other (See Comments)    myalgia  . Terazosin Other (See Comments)    unknown     Current outpatient prescriptions:  .  amLODipine (NORVASC)  10 MG tablet, Take 0.5 mg by mouth daily after supper., Disp: , Rfl:  .  aspirin 325 MG tablet, Take 325 mg by mouth daily., Disp: , Rfl:  .  atorvastatin (LIPITOR) 40 MG tablet, Take 1 tablet (40 mg total) by mouth daily at 6 PM., Disp: 30 tablet, Rfl: 5 .  clopidogrel (PLAVIX) 75 MG tablet, Take 75 mg by mouth at bedtime. , Disp: , Rfl:  .  feeding supplement (BOOST HIGH PROTEIN) LIQD, Take 1 Container by mouth 3 (three) times daily between meals., Disp: , Rfl:  .  feeding supplement, ENSURE COMPLETE, (ENSURE COMPLETE) LIQD, Take 237 mLs by mouth 2 (two) times daily between meals., Disp: 60 Bottle, Rfl: 0 .  furosemide (LASIX) 20 MG tablet, Take 20 mg by mouth at bedtime. , Disp: , Rfl: 3 .  isosorbide mononitrate  (IMDUR) 30 MG 24 hr tablet, Take 1 tablet (30 mg total) by mouth daily., Disp: 30 tablet, Rfl: 3 .  metoprolol (TOPROL-XL) 200 MG 24 hr tablet, Take 100 mg by mouth daily., Disp: , Rfl:  .  nitroGLYCERIN (NITROSTAT) 0.4 MG SL tablet, Place 1 tablet (0.4 mg total) under the tongue every 5 (five) minutes as needed. For chest pain, Disp: 20 tablet, Rfl: 12 .  potassium chloride (K-DUR,KLOR-CON) 10 MEQ tablet, Take 10 mEq by mouth once., Disp: , Rfl:  .  Tamsulosin HCl (FLOMAX) 0.4 MG CAPS, Take 0.4 mg by mouth daily after supper. , Disp: , Rfl:  .  traMADol (ULTRAM) 50 MG tablet, Take 50 mg by mouth 3 (three) times daily as needed for severe pain., Disp: , Rfl:  .  acetaminophen (TYLENOL) 325 MG tablet, Take 650 mg by mouth every 12 (twelve) hours., Disp: , Rfl:   Filed Vitals:   06/10/15 1529 06/10/15 1531  BP: 152/79 161/88  Pulse: 76   Height: 5\' 9"  (1.753 m)   Weight: 142 lb 3.2 oz (64.501 kg)   SpO2: 97%     Body mass index is 20.99 kg/(m^2).         Review of Systems denies active chest pain but does have mild dyspnea on exertion. Has swelling in both lower extremities. Complains of pain in his right leg. Weakness in his right leg. Occasional dizziness. She has had sleep apnea in the past. Has remote history of a left brain CVA. Had a myocardial perfusion study performed in June 2016 with EF of 32% and no ischemia but fixed defects from previous infarcts     Objective:   Physical Exam BP 161/88 mmHg  Pulse 76  Ht 5\' 9"  (1.753 m)  Wt 142 lb 3.2 oz (64.501 kg)  BMI 20.99 kg/m2  SpO2 97%  Gen.-alert and oriented x3 in no apparent distress HEENT normal for age Lungs no rhonchi or wheezing Cardiovascular regular rhythm no murmurs carotid pulses 3+ palpable no bruits audible Abdomen soft nontender no palpable masses Musculoskeletal free of  major deformities Skin clear -no rashes Neurologic normal Lower extremities 3+ femoral and absent popliteal and distal pulses on the  left Absent right femoral and distal pulses on the right. Right foot cooler than left.       Assessment:     1 diffuse dilatation of descending thoracic aorta-up to 5.1 cm in maximum diameter above the diaphragm-no saccular component #2 history coronary artery disease with previous coronary artery bypass grafting in 2000 with recent myocardial perfusion study revealing EF 32% and no active ischemia #3 severe PAD with failed right femoral-popliteal bypass  graft done by Dr. 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 Myra GianottiBrabham on July 19. If not successful patient will need left right femoral-femoral bypass by Dr. early or Hart RochesterLawson

## 2015-06-24 NOTE — Pre-Procedure Instructions (Signed)
Fransico HimRaymond Buescher  06/24/2015      [id number]- [place], [place] - [address] [place] [address] Phone: [phone] Fax: [phone]  [facility] [place] - [id number] [place] [id number] Phone: [phone] Fax: [phone]    Your procedure is scheduled on Wednesday, June 25, 2015  [facility] Ad[facility] at 6:30 A.M.  Call this number if you have problems the morning of surgery:  [phone]   Remember:  Do not eat food or drink liquids after midnight.  Take these medicines the morning of surgery with A SIP OF WATER : Nitrostat for pain Stop taking vitamins and herbal medications. Do not take any NSAIDs ie: Ibuprofen, Advil, Naproxen and etc.  Do not wear jewelry, make-up or nail polish.  Do not wear lotions, powders, or perfumes.  You may not wear deodorant.  Do not shave 48 hours prior to surgery.  Men may shave face and neck.  Do not bring valuables to the hospital.  [facility] not responsible for any belongings or valuables.  Contacts, dentures or bridgework may not be worn into surgery.  Leave your suitcase in the car.  After surgery it may be brought to your room.  For patients admitted to the hospital, discharge time will be determined by your treatment team.  Patients discharged the day of surgery will not be allowed to drive home.   Name and phone number of your driver: Special instructions: Special Instructions:Special Instructions: [facility] - Preparing for [facility]  Before surgery, you can play an important role.  Because skin is not sterile, your skin needs to be as free of germs as possible.  You can reduce the number of germs on you skin by washing with CHG (chlorahexidine gluconate) soap before surgery.  CHG is an antiseptic cleaner which kills germs and bonds with the skin to continue killing germs even after washing.  Please DO NOT use if  you have an allergy to CHG or antibacterial soaps.  If your skin becomes reddened/irritated stop using the CHG and inform your nurse when you arrive at Short Stay.  Do not shave (including legs and underarms) for at least 48 hours prior to the first CHG shower.  You may shave your face.  Please follow these instructions carefully:   1.  Shower with CHG Soap the night before surgery and the morning of Surgery.  2.  If you choose to wash your hair, wash your hair first as usual with your normal shampoo.  3.  After you shampoo, rinse your hair and body thoroughly to remove the Shampoo.  4.  Use CHG as you would any other liquid soap.  You can apply chg directly  to the skin and wash gently with scrungie or a clean washcloth.  5.  Apply the CHG Soap to your body ONLY FROM THE NECK DOWN.  Do not use on open wounds or open sores.  Avoid contact with your eyes, ears, mouth and genitals (private parts).  Wash genitals (private parts) with your normal soap.  6.  Wash thoroughly, paying special attention to the area where your surgery will be performed.  7.  Thoroughly rinse your body with warm water from the neck down.  8.  DO NOT shower/wash with your normal soap after using and rinsing off the CHG Soap.  9.  Pat yourself dry with a clean towel.  10.  Wear clean pajamas.            11.  Place clean sheets on your bed the night of your first shower and do not sleep with pets.  Day of Surgery  Do not apply any lotions/deodorants the morning of surgery.  Please wear clean clothes to the hospital/surgery center.  Please read over the following fact sheets that you were given. Pain Booklet, Coughing and Deep Breathing, Blood Transfusion Information, MRSA Information and Surgical Site Infection Prevention

## 2015-06-24 NOTE — Op Note (Signed)
    Patient name: [name] MRN: [id number] DOB: 12/03/1927 Sex: male  06/24/2015 Pre-operative Diagnosis: Right leg claudication Post-operative diagnosis:  Same Surgeon:  Durene CalBrabham, Wells Procedure Performed:  1.  Ultrasound-guided access, left femoral artery  2.  Abdominal aortogram  3.  Bilateral lower extremity runoff  4.  Second order catheterization    Indications:  The patient has a history of a failed right femoral-popliteal bypass graft by Dr. early.  He has developed progressive symptoms in the right leg.  Ultrasound suggested iliac occlusion.  He is here for further evaluation  Procedure:  The patient was identified in the holding area and taken to room 8.  The patient was then placed supine on the table and prepped and draped in the usual sterile fashion.  A time out was called.  Ultrasound was used to evaluate the left common femoral artery.  It was patent .  A digital ultrasound image was acquired.  A micropuncture needle was used to access the left common femoral artery under ultrasound guidance.  An 018 wire was advanced without resistance and a micropuncture sheath was placed.  The 018 wire was removed and a benson wire was placed.  The micropuncture sheath was exchanged for a 5 french sheath.  An omniflush catheter was advanced over the wire to the level of L-1.  An abdominal angiogram was obtained.  Next, using the omniflush catheter and a benson wire, the aortic bifurcation was crossed and the catheter was placed into theright external iliac artery and right runoff was obtained.  left runoff was performed via retrograde sheath injections.  Findings:   Aortogram:  No significant suprarenal aortic stenosis is identified.  The renal arteries are patent bilaterally without significant stenosis.  The infrarenal abdominal aorta is ectatic and calcified without stenosis.  Bilateral common iliac arteries are widely patent.  Bilateral external iliac arteries are patent but extremely  tortuous.  Right Lower Extremity:  there is occlusion of the right common femoral artery.  Superficial femoral artery is occluded.  There is reconstitution of the above-knee popliteal artery from profunda collaterals.  There is two-vessel runoff via the posterior tibial and peroneal artery.    Left Lower Extremity:  The left common femoral, profundofemoral artery for patent throughout their course.  The superficial femoral artery is occluded with reconstitution of the above-knee popliteal artery and two-vessel runoff via the posterior tibial and peroneal artery  Intervention:  none   Impression:  #1  right common femoral occlusion.    #2  right superficial femoral artery occlusion with reconstitution of the above-knee popliteal artery and two-vessel runoff via the posterior tibial and peroneal artery  #3  Left superficial femoral artery occlusion with reconstitution of the above-knee popliteal artery and 2 vessel runoff via the posterior tibial and peroneal artery     V. Durene CalWells Brabham, M.D. [facility] [place] Office: [phone] Pager:  [phone]

## 2015-06-24 NOTE — Progress Notes (Signed)
Pt denies SOB and chest pain. Pt stated that he takes all of his medications at night. Pt pre-op instructions corrected and pt made aware to only take Nitrostat for chest pain if needed the morning of procedure.

## 2015-06-24 NOTE — Interval H&P Note (Signed)
History and Physical Interval Note:  06/24/2015 7:31 AM  [name]  has presented today for surgery, with the diagnosis of right iliac artery occlusion  The various methods of treatment have been discussed with the patient and family. After consideration of risks, benefits and other options for treatment, the patient has consented to  Procedure(s): Abdominal Aortogram (N/A) as a surgical intervention .  The patient's history has been reviewed, patient examined, no change in status, stable for surgery.  I have reviewed the patient's chart and labs.  Questions were answered to the patient's satisfaction.     Durene CalBrabham, Wells

## 2015-06-24 NOTE — Discharge Instructions (Signed)
Angiogram °Angiogram, Care After °Refer to this sheet in the next few weeks. These instructions provide you with information on caring for yourself after your procedure. Your health care provider may also give you more specific instructions. Your treatment has been planned according to current medical practices, but problems sometimes occur. Call your health care provider if you have any problems or questions after your procedure.  °WHAT TO EXPECT AFTER THE PROCEDURE °After your procedure, it is typical to have the following sensations: °· Minor discomfort or tenderness and a small bump at the catheter insertion site. The bump should usually decrease in size and tenderness within 1 to 2 weeks. °· Any bruising will usually fade within 2 to 4 weeks. °HOME CARE INSTRUCTIONS  °· You may need to keep taking blood thinners if they were prescribed for you. Take medicines only as directed by your health care provider. °· Do not apply powder or lotion to the site. °· Do not take baths, swim, or use a hot tub until your health care provider approves. °· You may shower 24 hours after the procedure. Remove the bandage (dressing) and gently wash the site with plain soap and water. Gently pat the site dry. °· Inspect the site at least twice daily. °· Limit your activity for the first 48 hours. Do not bend, squat, or lift anything over 20 lb (9 kg) or as directed by your health care provider. °· Plan to have someone take you home after the procedure. Follow instructions about when you can drive or return to work. °SEEK MEDICAL CARE IF: °· You get light-headed when standing up. °· You have drainage (other than a small amount of blood on the dressing). °· You have chills. °· You have a fever. °· You have redness, warmth, swelling, or pain at the insertion site. °SEEK IMMEDIATE MEDICAL CARE IF:  °· You develop chest pain or shortness of breath, feel faint, or pass out. °· You have bleeding, swelling larger than a walnut, or drainage  from the catheter insertion site. °· You develop pain, discoloration, coldness, or severe bruising in the leg or arm that held the catheter. °· You develop bleeding from any other place, such as the bowels. You may see bright red blood in your urine or stools, or your stools may appear black and tarry. °· You have heavy bleeding from the site. If this happens, hold pressure on the site. °MAKE SURE YOU: °· Understand these instructions. °· Will watch your condition. °· Will get help right away if you are not doing well or get worse. °Document Released: 06/10/2005 Document Revised: 04/08/2014 Document Reviewed: 04/16/2013 °ExitCare® Patient Information ©2015 [employer]. This information is not intended to replace advice given to you by your health care provider. Make sure you discuss any questions you have with your health care provider. ° °

## 2015-06-24 NOTE — Progress Notes (Addendum)
Site area: Left groin a 5 french arterial sheath was removed  Site Prior to Removal:  Level 0  Pressure Applied For 15 MINUTES    Minutes Beginning at 0840am  Manual:   Yes.    Patient Status During Pull:  stable  Post Pull Groin Site:  Level 0  Post Pull Instructions Given:  Yes.    Post Pull Pulses Present:  Yes.    Dressing Applied:  Yes.    Comments:  VS remain stable at this time.  Pt denies any any discomfort at site at this time. Dr Arelia LongestV Brabham in to              Talk to pt about the results of test and plan of care.

## 2015-06-25 ENCOUNTER — Encounter (HOSPITAL_COMMUNITY)
Admission: RE | Disposition: A | Discharge status: Skilled Nursing Facility | Payer: Self-pay | Source: Ambulatory Visit | Attending: Vascular Surgery

## 2015-06-25 ENCOUNTER — Inpatient Hospital Stay (HOSPITAL_COMMUNITY)
Admission: RE | Admit: 2015-06-25 | Discharge: 2015-06-28 | DRG: 271 | Disposition: A | Discharge status: Skilled Nursing Facility | Payer: Medicare Other | Source: Ambulatory Visit | Attending: Vascular Surgery | Admitting: Vascular Surgery

## 2015-06-25 ENCOUNTER — Inpatient Hospital Stay (HOSPITAL_COMMUNITY): Payer: Medicare Other | Admitting: Certified Registered"

## 2015-06-25 ENCOUNTER — Encounter (HOSPITAL_COMMUNITY): Payer: Self-pay

## 2015-06-25 DIAGNOSIS — Z950 Presence of cardiac pacemaker: Secondary | ICD-10-CM | POA: Diagnosis not present

## 2015-06-25 DIAGNOSIS — Z8673 Personal history of transient ischemic attack (TIA), and cerebral infarction without residual deficits: Secondary | ICD-10-CM | POA: Diagnosis not present

## 2015-06-25 DIAGNOSIS — I2510 Atherosclerotic heart disease of native coronary artery without angina pectoris: Secondary | ICD-10-CM | POA: Diagnosis present

## 2015-06-25 DIAGNOSIS — I70211 Atherosclerosis of native arteries of extremities with intermittent claudication, right leg: Secondary | ICD-10-CM

## 2015-06-25 DIAGNOSIS — M79609 Pain in unspecified limb: Secondary | ICD-10-CM | POA: Diagnosis not present

## 2015-06-25 DIAGNOSIS — Z791 Long term (current) use of non-steroidal anti-inflammatories (NSAID): Secondary | ICD-10-CM | POA: Diagnosis not present

## 2015-06-25 DIAGNOSIS — E039 Hypothyroidism, unspecified: Secondary | ICD-10-CM | POA: Diagnosis present

## 2015-06-25 DIAGNOSIS — F17210 Nicotine dependence, cigarettes, uncomplicated: Secondary | ICD-10-CM | POA: Diagnosis present

## 2015-06-25 DIAGNOSIS — I745 Embolism and thrombosis of iliac artery: Secondary | ICD-10-CM | POA: Diagnosis present

## 2015-06-25 DIAGNOSIS — I5022 Chronic systolic (congestive) heart failure: Secondary | ICD-10-CM | POA: Diagnosis present

## 2015-06-25 DIAGNOSIS — Z7982 Long term (current) use of aspirin: Secondary | ICD-10-CM | POA: Diagnosis not present

## 2015-06-25 DIAGNOSIS — Z951 Presence of aortocoronary bypass graft: Secondary | ICD-10-CM | POA: Diagnosis not present

## 2015-06-25 DIAGNOSIS — E785 Hyperlipidemia, unspecified: Secondary | ICD-10-CM | POA: Diagnosis present

## 2015-06-25 DIAGNOSIS — I739 Peripheral vascular disease, unspecified: Secondary | ICD-10-CM | POA: Diagnosis present

## 2015-06-25 DIAGNOSIS — Z955 Presence of coronary angioplasty implant and graft: Secondary | ICD-10-CM | POA: Diagnosis not present

## 2015-06-25 DIAGNOSIS — I714 Abdominal aortic aneurysm, without rupture: Secondary | ICD-10-CM | POA: Diagnosis present

## 2015-06-25 DIAGNOSIS — J449 Chronic obstructive pulmonary disease, unspecified: Secondary | ICD-10-CM | POA: Diagnosis present

## 2015-06-25 DIAGNOSIS — Z7902 Long term (current) use of antithrombotics/antiplatelets: Secondary | ICD-10-CM | POA: Diagnosis not present

## 2015-06-25 DIAGNOSIS — I252 Old myocardial infarction: Secondary | ICD-10-CM | POA: Diagnosis not present

## 2015-06-25 DIAGNOSIS — Z79899 Other long term (current) drug therapy: Secondary | ICD-10-CM | POA: Diagnosis not present

## 2015-06-25 HISTORY — PX: ENDARTERECTOMY FEMORAL: SHX5804

## 2015-06-25 HISTORY — PX: PATCH ANGIOPLASTY: SHX6230

## 2015-06-25 LAB — CBC
HCT: 34.8 % — ABNORMAL LOW (ref 39.0–52.0)
HEMOGLOBIN: 11.6 g/dL — AB (ref 13.0–17.0)
MCH: 31.6 pg (ref 26.0–34.0)
MCHC: 33.3 g/dL (ref 30.0–36.0)
MCV: 94.8 fL (ref 78.0–100.0)
PLATELETS: 122 10*3/uL — AB (ref 150–400)
RBC: 3.67 MIL/uL — AB (ref 4.22–5.81)
RDW: 15.2 % (ref 11.5–15.5)
WBC: 15.4 10*3/uL — ABNORMAL HIGH (ref 4.0–10.5)

## 2015-06-25 LAB — POCT I-STAT 4, (NA,K, GLUC, HGB,HCT)
Glucose, Bld: 125 mg/dL — ABNORMAL HIGH (ref 65–99)
HEMATOCRIT: 37 % — AB (ref 39.0–52.0)
Hemoglobin: 12.6 g/dL — ABNORMAL LOW (ref 13.0–17.0)
Potassium: 3.7 mmol/L (ref 3.5–5.1)
SODIUM: 142 mmol/L (ref 135–145)

## 2015-06-25 LAB — CREATININE, SERUM
Creatinine, Ser: 1.61 mg/dL — ABNORMAL HIGH (ref 0.61–1.24)
GFR calc Af Amer: 43 mL/min — ABNORMAL LOW (ref >60)
GFR, EST NON AFRICAN AMERICAN: 37 mL/min — AB (ref >60)

## 2015-06-25 SURGERY — ENDARTERECTOMY, FEMORAL
Anesthesia: General | Laterality: Right

## 2015-06-25 MED ORDER — PROTAMINE SULFATE 10 MG/ML IV SOLN
INTRAVENOUS | Status: DC | PRN
  Administered 2015-06-25: 90 mg via INTRAVENOUS
  Administered 2015-06-25: 10 mg via INTRAVENOUS

## 2015-06-25 MED ORDER — TAMSULOSIN HCL 0.4 MG PO CAPS
0.4000 mg | ORAL_CAPSULE | Freq: Every day | ORAL | Status: DC
  Administered 2015-06-25 – 2015-06-27 (×3): 0.4 mg via ORAL
  Filled 2015-06-25 (×4): qty 1

## 2015-06-25 MED ORDER — LACTATED RINGERS IV SOLN
INTRAVENOUS | Status: DC | PRN
  Administered 2015-06-25: 08:00:00 via INTRAVENOUS

## 2015-06-25 MED ORDER — LIDOCAINE-EPINEPHRINE 0.5 %-1:200000 IJ SOLN
INTRAMUSCULAR | Status: DC | PRN
  Administered 2015-06-25: 50 mL

## 2015-06-25 MED ORDER — ACETAMINOPHEN 325 MG PO TABS
650.0000 mg | ORAL_TABLET | Freq: Two times a day (BID) | ORAL | Status: DC
  Administered 2015-06-25 – 2015-06-28 (×6): 650 mg via ORAL
  Filled 2015-06-25 (×6): qty 2

## 2015-06-25 MED ORDER — BOOST HIGH PROTEIN PO LIQD
1.0000 | Freq: Three times a day (TID) | ORAL | Status: DC
  Administered 2015-06-25: 237 mL via ORAL
  Administered 2015-06-26: 1 via ORAL
  Administered 2015-06-26: 237 mL via ORAL
  Administered 2015-06-27: 1 via ORAL
  Administered 2015-06-27 (×2): 237 mL via ORAL
  Filled 2015-06-25 (×12): qty 237

## 2015-06-25 MED ORDER — HYDROMORPHONE HCL 1 MG/ML IJ SOLN
INTRAMUSCULAR | Status: AC
  Filled 2015-06-25: qty 1

## 2015-06-25 MED ORDER — BISACODYL 10 MG RE SUPP
10.0000 mg | Freq: Every day | RECTAL | Status: DC | PRN
Start: 2015-06-25 — End: 2015-06-28

## 2015-06-25 MED ORDER — EPHEDRINE SULFATE 50 MG/ML IJ SOLN
INTRAMUSCULAR | Status: AC
  Filled 2015-06-25: qty 1

## 2015-06-25 MED ORDER — DEXTROSE 5 % IV SOLN
1.5000 g | Freq: Two times a day (BID) | INTRAVENOUS | Status: AC
  Administered 2015-06-25 – 2015-06-26 (×2): 1.5 g via INTRAVENOUS
  Filled 2015-06-25 (×2): qty 1.5

## 2015-06-25 MED ORDER — HYDRALAZINE HCL 20 MG/ML IJ SOLN: 5.0000 mg | INTRAMUSCULAR | Status: DC | PRN

## 2015-06-25 MED ORDER — LABETALOL HCL 5 MG/ML IV SOLN
10.0000 mg | INTRAVENOUS | Status: DC | PRN
  Filled 2015-06-25: qty 4

## 2015-06-25 MED ORDER — MEPERIDINE HCL 25 MG/ML IJ SOLN: 6.2500 mg | INTRAMUSCULAR | Status: DC | PRN

## 2015-06-25 MED ORDER — SODIUM CHLORIDE 0.9 % IV SOLN
INTRAVENOUS | Status: DC
  Administered 2015-06-25 – 2015-06-26 (×2): via INTRAVENOUS

## 2015-06-25 MED ORDER — SERTRALINE HCL 50 MG PO TABS
50.0000 mg | ORAL_TABLET | Freq: Every day | ORAL | Status: DC
  Administered 2015-06-25 – 2015-06-27 (×3): 50 mg via ORAL
  Filled 2015-06-25 (×5): qty 1

## 2015-06-25 MED ORDER — ALUM & MAG HYDROXIDE-SIMETH 200-200-20 MG/5ML PO SUSP: 15.0000 mL | ORAL | Status: DC | PRN

## 2015-06-25 MED ORDER — PROPOFOL 10 MG/ML IV BOLUS
INTRAVENOUS | Status: AC
  Filled 2015-06-25: qty 20

## 2015-06-25 MED ORDER — ETOMIDATE 2 MG/ML IV SOLN
INTRAVENOUS | Status: DC | PRN
  Administered 2015-06-25: 14 mg via INTRAVENOUS

## 2015-06-25 MED ORDER — POTASSIUM CHLORIDE CRYS ER 10 MEQ PO TBCR
10.0000 meq | EXTENDED_RELEASE_TABLET | Freq: Once | ORAL | Status: AC
  Administered 2015-06-25: 10 meq via ORAL
  Filled 2015-06-25: qty 1

## 2015-06-25 MED ORDER — ACETAMINOPHEN 325 MG PO TABS: 325.0000 mg | ORAL_TABLET | ORAL | Status: DC | PRN

## 2015-06-25 MED ORDER — HEPARIN SODIUM (PORCINE) 1000 UNIT/ML IJ SOLN
INTRAMUSCULAR | Status: DC | PRN
  Administered 2015-06-25: 6000 [IU] via INTRAVENOUS

## 2015-06-25 MED ORDER — FENTANYL CITRATE (PF) 100 MCG/2ML IJ SOLN
INTRAMUSCULAR | Status: DC | PRN
  Administered 2015-06-25: 50 ug via INTRAVENOUS
  Administered 2015-06-25 (×2): 100 ug via INTRAVENOUS

## 2015-06-25 MED ORDER — METOPROLOL SUCCINATE ER 100 MG PO TB24
100.0000 mg | ORAL_TABLET | Freq: Every day | ORAL | Status: DC
  Administered 2015-06-27 – 2015-06-28 (×2): 100 mg via ORAL
  Filled 2015-06-25 (×4): qty 1

## 2015-06-25 MED ORDER — PHENYLEPHRINE HCL 10 MG/ML IJ SOLN
INTRAMUSCULAR | Status: DC | PRN
  Administered 2015-06-25 (×6): 80 ug via INTRAVENOUS

## 2015-06-25 MED ORDER — SENNOSIDES-DOCUSATE SODIUM 8.6-50 MG PO TABS
1.0000 | ORAL_TABLET | Freq: Every evening | ORAL | Status: DC | PRN
  Filled 2015-06-25: qty 1

## 2015-06-25 MED ORDER — THROMBIN 20000 UNITS EX SOLR
CUTANEOUS | Status: DC | PRN
  Administered 2015-06-25: 12:00:00 via TOPICAL

## 2015-06-25 MED ORDER — CHLORHEXIDINE GLUCONATE CLOTH 2 % EX PADS: 6.0000 | MEDICATED_PAD | Freq: Once | CUTANEOUS | Status: DC

## 2015-06-25 MED ORDER — SODIUM CHLORIDE 0.9 % IR SOLN
Status: DC | PRN
  Administered 2015-06-25: 08:00:00

## 2015-06-25 MED ORDER — ATORVASTATIN CALCIUM 40 MG PO TABS
40.0000 mg | ORAL_TABLET | Freq: Every day | ORAL | Status: DC
  Administered 2015-06-25 – 2015-06-27 (×3): 40 mg via ORAL
  Filled 2015-06-25 (×5): qty 1

## 2015-06-25 MED ORDER — ENSURE ENLIVE PO LIQD
237.0000 mL | Freq: Two times a day (BID) | ORAL | Status: DC
  Administered 2015-06-26 – 2015-06-28 (×4): 237 mL via ORAL

## 2015-06-25 MED ORDER — ONDANSETRON HCL 4 MG/2ML IJ SOLN: 4.0000 mg | Freq: Once | INTRAMUSCULAR | Status: DC | PRN

## 2015-06-25 MED ORDER — 0.9 % SODIUM CHLORIDE (POUR BTL) OPTIME
TOPICAL | Status: DC | PRN
  Administered 2015-06-25: 2000 mL

## 2015-06-25 MED ORDER — GLYCOPYRROLATE 0.2 MG/ML IJ SOLN
INTRAMUSCULAR | Status: AC
  Filled 2015-06-25: qty 1

## 2015-06-25 MED ORDER — DOCUSATE SODIUM 100 MG PO CAPS
100.0000 mg | ORAL_CAPSULE | Freq: Every day | ORAL | Status: DC
  Administered 2015-06-26 – 2015-06-28 (×3): 100 mg via ORAL
  Filled 2015-06-25 (×3): qty 1

## 2015-06-25 MED ORDER — OXYCODONE-ACETAMINOPHEN 5-325 MG PO TABS
1.0000 | ORAL_TABLET | ORAL | Status: DC | PRN
  Administered 2015-06-25: 2 via ORAL
  Administered 2015-06-25 – 2015-06-26 (×3): 1 via ORAL
  Administered 2015-06-27 (×2): 2 via ORAL
  Administered 2015-06-27: 1 via ORAL
  Filled 2015-06-25: qty 1
  Filled 2015-06-25 (×2): qty 2
  Filled 2015-06-25 (×2): qty 1
  Filled 2015-06-25: qty 2
  Filled 2015-06-25: qty 1

## 2015-06-25 MED ORDER — HYDROMORPHONE HCL 1 MG/ML IJ SOLN
0.2500 mg | INTRAMUSCULAR | Status: DC | PRN
  Administered 2015-06-25 (×2): 0.5 mg via INTRAVENOUS

## 2015-06-25 MED ORDER — LIDOCAINE-EPINEPHRINE 0.5 %-1:200000 IJ SOLN
INTRAMUSCULAR | Status: AC
  Filled 2015-06-25: qty 1

## 2015-06-25 MED ORDER — HEPARIN SODIUM (PORCINE) 1000 UNIT/ML IJ SOLN
INTRAMUSCULAR | Status: AC
  Filled 2015-06-25: qty 1

## 2015-06-25 MED ORDER — MAGNESIUM SULFATE 2 GM/50ML IV SOLN
2.0000 g | Freq: Every day | INTRAVENOUS | Status: DC | PRN
  Filled 2015-06-25: qty 50

## 2015-06-25 MED ORDER — ALBUMIN HUMAN 5 % IV SOLN
INTRAVENOUS | Status: DC | PRN
  Administered 2015-06-25: 12:00:00 via INTRAVENOUS

## 2015-06-25 MED ORDER — MORPHINE SULFATE 2 MG/ML IJ SOLN
2.0000 mg | INTRAMUSCULAR | Status: DC | PRN
  Administered 2015-06-26: 2 mg via INTRAVENOUS
  Filled 2015-06-25 (×3): qty 1

## 2015-06-25 MED ORDER — ISOSORBIDE MONONITRATE ER 30 MG PO TB24
30.0000 mg | ORAL_TABLET | Freq: Every day | ORAL | Status: DC
  Administered 2015-06-25 – 2015-06-28 (×4): 30 mg via ORAL
  Filled 2015-06-25 (×4): qty 1

## 2015-06-25 MED ORDER — AMLODIPINE BESYLATE 2.5 MG PO TABS
2.5000 mg | ORAL_TABLET | Freq: Every day | ORAL | Status: DC
  Administered 2015-06-25 – 2015-06-27 (×3): 2.5 mg via ORAL
  Filled 2015-06-25 (×5): qty 1

## 2015-06-25 MED ORDER — LIDOCAINE HCL (CARDIAC) 20 MG/ML IV SOLN
INTRAVENOUS | Status: DC | PRN
  Administered 2015-06-25: 50 mg via INTRAVENOUS

## 2015-06-25 MED ORDER — EPHEDRINE SULFATE 50 MG/ML IJ SOLN
INTRAMUSCULAR | Status: DC | PRN
  Administered 2015-06-25: 30 mg via INTRAMUSCULAR

## 2015-06-25 MED ORDER — SUCCINYLCHOLINE CHLORIDE 20 MG/ML IJ SOLN
INTRAMUSCULAR | Status: AC
  Filled 2015-06-25: qty 1

## 2015-06-25 MED ORDER — ACETAMINOPHEN 650 MG RE SUPP: 325.0000 mg | RECTAL | Status: DC | PRN

## 2015-06-25 MED ORDER — CLOPIDOGREL BISULFATE 75 MG PO TABS
75.0000 mg | ORAL_TABLET | Freq: Every day | ORAL | Status: DC
  Administered 2015-06-26 – 2015-06-27 (×2): 75 mg via ORAL
  Filled 2015-06-25 (×4): qty 1

## 2015-06-25 MED ORDER — ASPIRIN EC 81 MG PO TBEC
81.0000 mg | DELAYED_RELEASE_TABLET | Freq: Every day | ORAL | Status: DC
  Administered 2015-06-26 – 2015-06-28 (×3): 81 mg via ORAL
  Filled 2015-06-25 (×3): qty 1

## 2015-06-25 MED ORDER — METOPROLOL TARTRATE 1 MG/ML IV SOLN: 2.0000 mg | INTRAVENOUS | Status: DC | PRN

## 2015-06-25 MED ORDER — NITROGLYCERIN 0.4 MG SL SUBL: 0.4000 mg | SUBLINGUAL_TABLET | SUBLINGUAL | Status: DC | PRN

## 2015-06-25 MED ORDER — DEXTROSE 5 % IV SOLN
10.0000 mg | INTRAVENOUS | Status: DC | PRN
  Administered 2015-06-25: 50 ug/min via INTRAVENOUS

## 2015-06-25 MED ORDER — GUAIFENESIN-DM 100-10 MG/5ML PO SYRP: 15.0000 mL | ORAL_SOLUTION | ORAL | Status: DC | PRN

## 2015-06-25 MED ORDER — ONDANSETRON HCL 4 MG/2ML IJ SOLN
4.0000 mg | Freq: Four times a day (QID) | INTRAMUSCULAR | Status: DC | PRN
  Administered 2015-06-25 – 2015-06-26 (×2): 4 mg via INTRAVENOUS
  Filled 2015-06-25 (×3): qty 2

## 2015-06-25 MED ORDER — POTASSIUM CHLORIDE CRYS ER 20 MEQ PO TBCR: 20.0000 meq | EXTENDED_RELEASE_TABLET | Freq: Every day | ORAL | Status: DC | PRN

## 2015-06-25 MED ORDER — CISATRACURIUM BESYLATE 20 MG/10ML IV SOLN
INTRAVENOUS | Status: AC
  Filled 2015-06-25: qty 20

## 2015-06-25 MED ORDER — SUCCINYLCHOLINE CHLORIDE 20 MG/ML IJ SOLN
INTRAMUSCULAR | Status: DC | PRN
  Administered 2015-06-25: 100 mg via INTRAVENOUS

## 2015-06-25 MED ORDER — ENOXAPARIN SODIUM 30 MG/0.3ML ~~LOC~~ SOLN
30.0000 mg | SUBCUTANEOUS | Status: DC
  Administered 2015-06-26 – 2015-06-28 (×3): 30 mg via SUBCUTANEOUS
  Filled 2015-06-25 (×4): qty 0.3

## 2015-06-25 MED ORDER — CETYLPYRIDINIUM CHLORIDE 0.05 % MT LIQD
7.0000 mL | Freq: Two times a day (BID) | OROMUCOSAL | Status: DC
  Administered 2015-06-25 – 2015-06-28 (×6): 7 mL via OROMUCOSAL

## 2015-06-25 MED ORDER — FENTANYL CITRATE (PF) 250 MCG/5ML IJ SOLN
INTRAMUSCULAR | Status: AC
  Filled 2015-06-25: qty 5

## 2015-06-25 MED ORDER — PHENOL 1.4 % MT LIQD: 1.0000 | OROMUCOSAL | Status: DC | PRN

## 2015-06-25 MED ORDER — THROMBIN 20000 UNITS EX SOLR
CUTANEOUS | Status: AC
  Filled 2015-06-25: qty 20000

## 2015-06-25 MED ORDER — PHENYLEPHRINE 40 MCG/ML (10ML) SYRINGE FOR IV PUSH (FOR BLOOD PRESSURE SUPPORT)
PREFILLED_SYRINGE | INTRAVENOUS | Status: AC
  Filled 2015-06-25: qty 10

## 2015-06-25 MED ORDER — SODIUM CHLORIDE 0.9 % IV SOLN: 500.0000 mL | Freq: Once | INTRAVENOUS | Status: AC | PRN

## 2015-06-25 MED ORDER — PANTOPRAZOLE SODIUM 40 MG PO TBEC
40.0000 mg | DELAYED_RELEASE_TABLET | Freq: Every day | ORAL | Status: DC
  Administered 2015-06-25 – 2015-06-28 (×4): 40 mg via ORAL
  Filled 2015-06-25 (×3): qty 1

## 2015-06-25 SURGICAL SUPPLY — 57 items
BENZOIN TINCTURE PRP APPL 2/3 (GAUZE/BANDAGES/DRESSINGS) ×4 IMPLANT
CANISTER SUCTION 2500CC (MISCELLANEOUS) ×4 IMPLANT
CANNULA VESSEL 3MM 2 BLNT TIP (CANNULA) ×4 IMPLANT
CATH EMB 3FR 80CM (CATHETERS) ×4 IMPLANT
CLIP LIGATING EXTRA MED SLVR (CLIP) ×4 IMPLANT
CLIP LIGATING EXTRA SM BLUE (MISCELLANEOUS) ×4 IMPLANT
CLOSURE WOUND 1/2 X4 (GAUZE/BANDAGES/DRESSINGS) ×1
COVER MAYO STAND STRL (DRAPES) ×4 IMPLANT
DECANTER SPIKE VIAL GLASS SM (MISCELLANEOUS) ×4 IMPLANT
DRAIN SNY 10X20 3/4 PERF (WOUND CARE) IMPLANT
DRAPE SURG ISO 125X83 STRL (DRAPES) ×4 IMPLANT
DRAPE X-RAY CASS 24X20 (DRAPES) IMPLANT
DRSG COVADERM 4X8 (GAUZE/BANDAGES/DRESSINGS) ×4 IMPLANT
ELECT REM PT RETURN 9FT ADLT (ELECTROSURGICAL) ×4
ELECTRODE REM PT RTRN 9FT ADLT (ELECTROSURGICAL) ×2 IMPLANT
EVACUATOR SILICONE 100CC (DRAIN) IMPLANT
GLOVE BIO SURGEON STRL SZ7.5 (GLOVE) ×4 IMPLANT
GLOVE BIOGEL PI IND STRL 7.0 (GLOVE) ×4 IMPLANT
GLOVE BIOGEL PI IND STRL 7.5 (GLOVE) ×2 IMPLANT
GLOVE BIOGEL PI IND STRL 8 (GLOVE) ×2 IMPLANT
GLOVE BIOGEL PI INDICATOR 7.0 (GLOVE) ×4
GLOVE BIOGEL PI INDICATOR 7.5 (GLOVE) ×2
GLOVE BIOGEL PI INDICATOR 8 (GLOVE) ×2
GLOVE ECLIPSE 7.0 STRL STRAW (GLOVE) ×4 IMPLANT
GLOVE SS BIOGEL STRL SZ 7.5 (GLOVE) ×2 IMPLANT
GLOVE SUPERSENSE BIOGEL SZ 7.5 (GLOVE) ×2
GLOVE SURG SS PI 7.0 STRL IVOR (GLOVE) ×12 IMPLANT
GOWN STRL REUS W/ TWL LRG LVL3 (GOWN DISPOSABLE) ×4 IMPLANT
GOWN STRL REUS W/ TWL XL LVL3 (GOWN DISPOSABLE) ×8 IMPLANT
GOWN STRL REUS W/TWL LRG LVL3 (GOWN DISPOSABLE) ×4
GOWN STRL REUS W/TWL XL LVL3 (GOWN DISPOSABLE) ×8
KIT BASIN OR (CUSTOM PROCEDURE TRAY) ×4 IMPLANT
KIT ROOM TURNOVER OR (KITS) ×4 IMPLANT
NEEDLE HYPO 25GX1X1/2 BEV (NEEDLE) ×4 IMPLANT
NS IRRIG 1000ML POUR BTL (IV SOLUTION) ×8 IMPLANT
PACK PERIPHERAL VASCULAR (CUSTOM PROCEDURE TRAY) ×4 IMPLANT
PAD ARMBOARD 7.5X6 YLW CONV (MISCELLANEOUS) ×8 IMPLANT
PATCH HEMASHIELD 8X150 (Vascular Products) ×4 IMPLANT
SET COLLECT BLD 21X3/4 12 (NEEDLE) IMPLANT
SPONGE GAUZE 4X4 12PLY STER LF (GAUZE/BANDAGES/DRESSINGS) ×4 IMPLANT
SPONGE LAP 18X18 X RAY DECT (DISPOSABLE) ×4 IMPLANT
SPONGE SURGIFOAM ABS GEL 100 (HEMOSTASIS) ×4 IMPLANT
STOPCOCK 4 WAY LG BORE MALE ST (IV SETS) IMPLANT
STRIP CLOSURE SKIN 1/2X4 (GAUZE/BANDAGES/DRESSINGS) ×3 IMPLANT
SUT ETHILON 3 0 PS 1 (SUTURE) IMPLANT
SUT PROLENE 5 0 C 1 24 (SUTURE) ×8 IMPLANT
SUT PROLENE 6 0 CC (SUTURE) ×20 IMPLANT
SUT SILK 2 0 SH (SUTURE) IMPLANT
SUT VIC AB 2-0 CTX 36 (SUTURE) ×8 IMPLANT
SUT VIC AB 3-0 SH 27 (SUTURE) ×4
SUT VIC AB 3-0 SH 27X BRD (SUTURE) ×4 IMPLANT
SYR 3ML LL SCALE MARK (SYRINGE) ×4 IMPLANT
SYR CONTROL 10ML LL (SYRINGE) ×4 IMPLANT
TRAY FOLEY W/METER SILVER 16FR (SET/KITS/TRAYS/PACK) ×4 IMPLANT
TUBING EXTENTION W/L.L. (IV SETS) IMPLANT
UNDERPAD 30X30 INCONTINENT (UNDERPADS AND DIAPERS) ×4 IMPLANT
WATER STERILE IRR 1000ML POUR (IV SOLUTION) ×4 IMPLANT

## 2015-06-25 NOTE — Anesthesia Preprocedure Evaluation (Addendum)
Anesthesia Evaluation  Patient identified by MRN, date of birth, ID band Patient awake    Reviewed: Allergy & Precautions, H&P , NPO status , Patient's Chart, lab work & pertinent test results  Airway Mallampati: I  TM Distance: >3 FB Neck ROM: full    Dental  (+) Missing, Dental Advidsory Given   Pulmonary shortness of breath and with exertion, COPDneg recent URI, Current Smoker,  breath sounds clear to auscultation  Pulmonary exam normal       Cardiovascular hypertension, + angina + CAD, + Past MI, + CABG, + Peripheral Vascular Disease, +CHF, + Orthopnea and + DOE Normal cardiovascular exam+ pacemaker IIIRhythm:regular Rate:Normal + Carotid Bruit    Neuro/Psych    GI/Hepatic   Endo/Other  Hypothyroidism   Renal/GU CRFRenal disease     Musculoskeletal   Abdominal   Peds  Hematology   Anesthesia Other Findings   Reproductive/Obstetrics                          Anesthesia Physical Anesthesia Plan  ASA: III  Anesthesia Plan: General   Post-op Pain Management:    Induction: Intravenous  Airway Management Planned: Oral ETT  Additional Equipment:   Intra-op Plan:   Post-operative Plan: Extubation in OR  Informed Consent: I have reviewed the patients History and Physical, chart, labs and discussed the procedure including the risks, benefits and alternatives for the proposed anesthesia with the patient or authorized representative who has indicated his/her understanding and acceptance.     Plan Discussed with: CRNA and Surgeon  Anesthesia Plan Comments:        Anesthesia Quick Evaluation

## 2015-06-25 NOTE — Anesthesia Postprocedure Evaluation (Signed)
Anesthesia Post Note  Patient: [name]  Procedure(s) Performed: Procedure(s) (LRB): ENDARTERECTOMY RIGHT FEMORAL ARTERY  (Right) RIGHT COMMON FEMORAL ARTERY AND PROFUNDA PATCH ANGIOPLASTY USING HEMASHIELD PLATINUM FINESSE PATCH  (Right)  Anesthesia type: general  Patient location: PACU  Post pain: Pain level controlled  Post assessment: Patient's Cardiovascular Status Stable  Last Vitals:  Filed Vitals:   06/25/15 1446  BP: 116/52  Pulse: 72  Temp: 35.6 C  Resp: 7    Post vital signs: Reviewed and stable  Level of consciousness: sedated  Complications: No apparent anesthesia complications

## 2015-06-25 NOTE — Transfer of Care (Signed)
Immediate Anesthesia Transfer of Care Note  Patient: [name]  Procedure(s) Performed: Procedure(s): ENDARTERECTOMY RIGHT FEMORAL ARTERY  (Right) RIGHT COMMON FEMORAL ARTERY AND PROFUNDA PATCH ANGIOPLASTY USING HEMASHIELD PLATINUM FINESSE PATCH  (Right)  Patient Location: PACU  Anesthesia Type:General  Level of Consciousness: awake, alert , oriented and sedated  Airway & Oxygen Therapy: Patient Spontanous Breathing and Patient connected to nasal cannula oxygen  Post-op Assessment: Report given to RN and Post -op Vital signs reviewed and stable  Post vital signs: Reviewed and stable  Last Vitals:  Filed Vitals:   06/25/15 0702  BP: 135/75  Pulse: 72  Temp: 36.4 C  Resp: 16    Complications: No apparent anesthesia complications

## 2015-06-25 NOTE — Progress Notes (Signed)
UR COMPLETED  

## 2015-06-25 NOTE — H&P (View-Only) (Signed)
Subjective:     Patient ID: [name], male   DOB: 12/26/1926, 81 y.o.   MRN: [id number]  HPI this 81-year-old male was referred by Dr. Nelson for evaluation of a diffuse descending thoracic aortic aneurysm measuring up to 5.1 cm in maximum diameter. Patient has a remote history of coronary artery bypass grafting and is had PTCA and stent interventions since that time. He is stable from a cardiac standpoint. A recent CT scan revealed diffuse dilatation of the descending thoracic aorta. Patient states that he is having severe right buttock thigh and calf pain with short distance ambulation which is quite severely limiting him. He occasionally will have rest discomfort in the right foot. He has no history of gangrene nonhealing ulcers infection. He does have a pacemaker in place. He continues to smoke a third to a quarter of a pack of cigarettes per day.  Past Medical History  Diagnosis Date  . Hypertension   . CAD (coronary artery disease)     a. s/p MI in 1988;  b. s/p CABG in 2000 (Dr. Owens) x 4, LIMA to LAD, SVG to diagonal, SVG to OM4, SVG to PDA;  c. 12/2013 NSTEMI/abnl MV;  d. 01/2014 Cath/PCI: native 3VD, VG->RCA 100, VG->OM aneurysmal 40ost/m, 99m(5.0x12 Veriflex BMS), VG->Diag aneurysmal, LIMA->LAD nl.  . TIA (transient ischemic attack)   . Hyperlipidemia   . COPD (chronic obstructive pulmonary disease)   . CVA (cerebral infarction) 07/2011    Remote left brain infarct with sensory deficits and had a left internal carotid stent placed by Dr. Deveshwar at that time, no recurrent CVA or TIAs or CNS events.  . PAD (peripheral artery disease)     Stable. Had a past RFPBG by Dr. Early in 2009. this graft is occluded with reconstitution in the popliteal artery, which was patent. Right tibial was occluded and 2-vessel runoff on angiography done by Dr. Brabham in 12/2010.  . Sick sinus syndrome     PPM implanted for this and syncope   . Chronic systolic CHF (congestive heart failure)    Hospitalization for CHF from 05/04/13-05/06/13. Treated medically. Had possible pneumonia with short course of antibiotics.  . Abdominal bruit     Loud  . AAA (abdominal aortic aneurysm)     Has a known AAA of 3.2 x 3.4 cm by abdominal untrasound 03/27/12. Followup abdominal ultrasound on 03/29/13 showed a diameter of 3.6 cm, which is stable  . Peripheral vascular disease   . Hypothyroid     On supplement "don't know if I take RX or not for my thyroid" (01/16/2014)  . Stroke     History  Substance Use Topics  . Smoking status: Current Every Day Smoker -- 0.50 packs/day for 68 years    Types: Cigarettes  . Smokeless tobacco: Former User    Quit date: 06/02/2011     Comment: 05/07/15 smokes 1/3 PPD, trying to quit  . Alcohol Use: No    Family History  Problem Relation Age of Onset  . Cancer Brother 60    oral   . Prostate cancer Brother   . Hypertension Brother   . Hyperlipidemia Brother   . Lung disease Father     black lung disease    Allergies  Allergen Reactions  . Pravachol Other (See Comments)    myalgias  . Simvastatin Other (See Comments)    myalgia  . Terazosin Other (See Comments)    unknown     Current outpatient prescriptions:  .  amLODipine (NORVASC)   10 MG tablet, Take 0.5 mg by mouth daily after supper., Disp: , Rfl:  .  aspirin 325 MG tablet, Take 325 mg by mouth daily., Disp: , Rfl:  .  atorvastatin (LIPITOR) 40 MG tablet, Take 1 tablet (40 mg total) by mouth daily at 6 PM., Disp: 30 tablet, Rfl: 5 .  clopidogrel (PLAVIX) 75 MG tablet, Take 75 mg by mouth at bedtime. , Disp: , Rfl:  .  feeding supplement (BOOST HIGH PROTEIN) LIQD, Take 1 Container by mouth 3 (three) times daily between meals., Disp: , Rfl:  .  feeding supplement, ENSURE COMPLETE, (ENSURE COMPLETE) LIQD, Take 237 mLs by mouth 2 (two) times daily between meals., Disp: 60 Bottle, Rfl: 0 .  furosemide (LASIX) 20 MG tablet, Take 20 mg by mouth at bedtime. , Disp: , Rfl: 3 .  isosorbide mononitrate  (IMDUR) 30 MG 24 hr tablet, Take 1 tablet (30 mg total) by mouth daily., Disp: 30 tablet, Rfl: 3 .  metoprolol (TOPROL-XL) 200 MG 24 hr tablet, Take 100 mg by mouth daily., Disp: , Rfl:  .  nitroGLYCERIN (NITROSTAT) 0.4 MG SL tablet, Place 1 tablet (0.4 mg total) under the tongue every 5 (five) minutes as needed. For chest pain, Disp: 20 tablet, Rfl: 12 .  potassium chloride (K-DUR,KLOR-CON) 10 MEQ tablet, Take 10 mEq by mouth once., Disp: , Rfl:  .  Tamsulosin HCl (FLOMAX) 0.4 MG CAPS, Take 0.4 mg by mouth daily after supper. , Disp: , Rfl:  .  traMADol (ULTRAM) 50 MG tablet, Take 50 mg by mouth 3 (three) times daily as needed for severe pain., Disp: , Rfl:  .  acetaminophen (TYLENOL) 325 MG tablet, Take 650 mg by mouth every 12 (twelve) hours., Disp: , Rfl:   Filed Vitals:   06/10/15 1529 06/10/15 1531  BP: 152/79 161/88  Pulse: 76   Height: 5\' 9"  (1.753 m)   Weight: 142 lb 3.2 oz (64.501 kg)   SpO2: 97%     Body mass index is 20.99 kg/(m^2).         Review of Systems denies active chest pain but does have mild dyspnea on exertion. Has swelling in both lower extremities. Complains of pain in his right leg. Weakness in his right leg. Occasional dizziness. She has had sleep apnea in the past. Has remote history of a left brain CVA. Had a myocardial perfusion study performed in June 2016 with EF of 32% and no ischemia but fixed defects from previous infarcts     Objective:   Physical Exam BP 161/88 mmHg  Pulse 76  Ht 5\' 9"  (1.753 m)  Wt 142 lb 3.2 oz (64.501 kg)  BMI 20.99 kg/m2  SpO2 97%  Gen.-alert and oriented x3 in no apparent distress HEENT normal for age Lungs no rhonchi or wheezing Cardiovascular regular rhythm no murmurs carotid pulses 3+ palpable no bruits audible Abdomen soft nontender no palpable masses Musculoskeletal free of  major deformities Skin clear -no rashes Neurologic normal Lower extremities 3+ femoral and absent popliteal and distal pulses on the  left Absent right femoral and distal pulses on the right. Right foot cooler than left.       Assessment:     1 diffuse dilatation of descending thoracic aorta-up to 5.1 cm in maximum diameter above the diaphragm-no saccular component #2 history coronary artery disease with previous coronary artery bypass grafting in 2000 with recent myocardial perfusion study revealing EF 32% and no active ischemia #3 severe PAD with failed right femoral-popliteal bypass  graft done by Dr. 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 Myra GianottiBrabham on July 19. If not successful patient will need left right femoral-femoral bypass by Dr. early or Hart RochesterLawson

## 2015-06-25 NOTE — Op Note (Signed)
    OPERATIVE REPORT  DATE OF SURGERY: 06/25/2015  PATIENT: [name], 81 y.o. male MRN: [id number]  DOB: August 29, 1927  PRE-OPERATIVE DIAGNOSIS: Rest pain right lower extremity  POST-OPERATIVE DIAGNOSIS:  Same  PROCEDURE: Right external iliac, common femoral and profundus endarterectomy with Dacron patch angioplasty  SURGEON:  Gretta Beganodd Ashlea Dusing, M.D.  PHYSICIAN ASSISTANT: Collins  ANESTHESIA:  Gen.  EBL: 750 ml  Total I/O In: 1850 [I.V.:1600; IV Piggyback:250] Out: 1065 [Urine:315; Blood:750]  BLOOD ADMINISTERED: None  DRAINS: None  SPECIMEN: None  COUNTS CORRECT:  YES  PLAN OF CARE: PACU   PATIENT DISPOSITION:  PACU - hemodynamically stable  PROCEDURE DETAILS: The patient was taken to the operating placed supine position where the left and right groin and entire right leg were prepped and draped in usual sterile fashion. Incision was made over the common femoral artery and carried down to isolate the common superficial femoral and profundus femoris arteries. The patient had an old nonfunctional Gore-Tex femoropopliteal bypass and this anastomosis was exposed as well. The artery was exposed up under the inguinal ligament and the external iliac artery was encircled with Vesseloops. There was pulse palpable at this level. The patient had a chronically occluded superficial femoral artery. This was also encircled with Vesseloops. The deep femoral artery was extended for approximately 3 inches and was extremely diseased and calcified throughout its course. A multiple side branches were controlled with Vesseloops. The patient was given 6002 venous heparin. After adequate circulation time the external iliac artery was occluded with a Henley clamp distal vessels were controlled with Vesseloops. The common femoral artery was opened and was chronically occluded. Dissection was tented up to the external iliac artery and there was patent at this level. The arteriotomy was continued onto the  superficial femoral artery which was chronically occluded. Superficial artery was then ligated distally and divided. Arteriotomy was continued down on the profundus femoris artery. This was continued for a great approximately 2 inches past the origin to multiple side branches. These were endarterectomized with good backbleeding in 3 different large branches of the deep femoral artery. The external iliac, profunda femoris arteries were endarterectomized. A Finesse Dacron patch was brought onto the field and was sewn as a patch angioplasty beginning on the external iliac and extending down to the deep femoral artery. Prior to completion of the anastomosis the usual flushing maneuvers were undertaken. Anastomosis was completed and clamps removed. There was good Doppler flow in multiple branches of the deep femoral artery. Patient had been on Plavix and there was a great deal needle hole bleeding. Several additional 60 sutures were required for hemostasis. Patient was given 50 mg of protamine to reverse heparin. There was a prolonged time for hemostasis with weeping from needle holes. Topical thrombin and Gelfoam was also used. Once adequate hemostasis was obtained. The wounds were irrigated and closed with 20 by by crural sutures in the fascia. The skin was closed with 3-0 subcuticular Vicryls suture. Dressing was applied the patient was transferred to the recovery room stable condition   Gretta Beganodd Dariya Gainer, M.D. 06/25/2015 4:30 PM

## 2015-06-25 NOTE — Progress Notes (Signed)
Pt c/o of being cold upon arrival to floor. Pt had rectal temp of 96.26F which later dropped to 95.22F upon reassessment. Doreatha MassedSamantha Rhyne, PA with [facility] notified. Blanket warmer applied. Will continue to monitor.

## 2015-06-25 NOTE — Progress Notes (Signed)
  Day of Surgery Note    Subjective:  Sleeping but awakes to voice  Filed Vitals:   06/25/15 1446  BP: 116/52  Pulse: 72  Temp: 96.1 F (35.6 C)  Resp: 7    Incisions:   Bandage is clean and dry Extremities:  Monophasic right PT doppler signal Lungs:  Non labored A  Assessment/Plan:  This is a 81 y.o. male who is s/p right femoral endarterectomy with patch angioplasty  -pt with faint monophasic right PT doppler signal-RN states this is not different from when pt was checked earlier by Dr. Arbie CookeyEarly. -RN states bandage was saturated earlier and this was changed.  Bandage is clean and dry -pt to 3 south when bed available.   Doreatha MassedSamantha Romey Cohea, PA-C 06/25/2015 3:28 PM

## 2015-06-25 NOTE — Interval H&P Note (Signed)
History and Physical Interval Note:  06/25/2015 6:46 AM  [name]  has presented today for surgery, with the diagnosis of Right iliac artery occlusion I74.5  The various methods of treatment have been discussed with the patient and family. After consideration of risks, benefits and other options for treatment, the patient has consented to  Procedure(s): ENDARTERECTOMY FEMORAL (Right) BYPASS GRAFT FEMORAL-FEMORAL ARTERY (Bilateral) as a surgical intervention .  The patient's history has been reviewed, patient examined, no change in status, stable for surgery.  I have reviewed the patient's chart and labs.  Questions were answered to the patient's satisfaction.     Gretta BeganEarly, Alima Naser

## 2015-06-26 ENCOUNTER — Inpatient Hospital Stay (HOSPITAL_COMMUNITY): Payer: Medicare Other

## 2015-06-26 ENCOUNTER — Encounter (HOSPITAL_COMMUNITY): Payer: Self-pay | Admitting: Vascular Surgery

## 2015-06-26 DIAGNOSIS — M79609 Pain in unspecified limb: Secondary | ICD-10-CM

## 2015-06-26 LAB — CBC
HEMATOCRIT: 26.6 % — AB (ref 39.0–52.0)
Hemoglobin: 8.9 g/dL — ABNORMAL LOW (ref 13.0–17.0)
MCH: 32.2 pg (ref 26.0–34.0)
MCHC: 33.5 g/dL (ref 30.0–36.0)
MCV: 96.4 fL (ref 78.0–100.0)
Platelets: 110 10*3/uL — ABNORMAL LOW (ref 150–400)
RBC: 2.76 MIL/uL — ABNORMAL LOW (ref 4.22–5.81)
RDW: 15.6 % — AB (ref 11.5–15.5)
WBC: 10.3 10*3/uL (ref 4.0–10.5)

## 2015-06-26 LAB — BASIC METABOLIC PANEL
Anion gap: 7 (ref 5–15)
BUN: 19 mg/dL (ref 6–20)
CHLORIDE: 109 mmol/L (ref 101–111)
CO2: 27 mmol/L (ref 22–32)
Calcium: 7.8 mg/dL — ABNORMAL LOW (ref 8.9–10.3)
Creatinine, Ser: 1.81 mg/dL — ABNORMAL HIGH (ref 0.61–1.24)
GFR calc Af Amer: 37 mL/min — ABNORMAL LOW (ref >60)
GFR, EST NON AFRICAN AMERICAN: 32 mL/min — AB (ref >60)
Glucose, Bld: 118 mg/dL — ABNORMAL HIGH (ref 65–99)
Potassium: 3.6 mmol/L (ref 3.5–5.1)
Sodium: 143 mmol/L (ref 135–145)

## 2015-06-26 NOTE — Progress Notes (Signed)
Pt. Called out for towels. This RN went into pt. Room and found that he had pulled out his foley catheter. Blood oozing from penis on assessment. MD Hart Rochester notified and gave no new orders. Will continue to monitor pt.

## 2015-06-26 NOTE — Progress Notes (Addendum)
[facility]  Subjective  - Doing well this am.  Patient is comfortable.  He removed his own foley last night around MN and had bleeding from the penis.  A condom cath was placed.     Objective 122/50 72 97.7 F (36.5 C) (Oral) 20 100%  Intake/Output Summary (Last 24 hours) at 06/26/15 0715 Last data filed at 06/25/15 2300  Gross per 24 hour  Intake 2889.58 ml  Output   1215 ml  Net 1674.58 ml    Right thigh dressing clean and dry Doppler PT/DP signals Heart sinus with PVC's Lungs non labored breathing  Assessment/Planning: POD # 1Right external iliac, common femoral and profundus endarterectomy with Dacron patch angioplasty Post surgical anemia asymptomatic CKD CR slight increased elevation 1.81, UO  50 cc  Pre MN.  Condom cath with blood in bag this am.  Bladder scan this am shows 102 cc.  We will maintain NS at 125 cc/hr.  Monitor UO. Encourage mobility WBC decreased to 10.3 from 15.4   Transfer to 2W    Clinton Gallant [facility] 06/26/2015 7:15 AM --  Laboratory Lab Results:  Recent Labs  06/25/15 1556 06/26/15 0315  WBC 15.4* 10.3  HGB 11.6* 8.9*  HCT 34.8* 26.6*  PLT 122* 110*   BMET  Recent Labs  06/24/15 1302 06/25/15 1138 06/25/15 1556 06/26/15 0315  NA 142 142  --  143  K 3.4* 3.7  --  3.6  CL 105  --   --  109  CO2 30  --   --  27  GLUCOSE 114* 125*  --  118*  BUN 14  --   --  19  CREATININE 1.45*  --  1.61* 1.81*  CALCIUM 8.7*  --   --  7.8*    COAG Lab Results  Component Value Date   INR 1.18 06/24/2015   INR 1.11 05/13/2015   INR 1.1* 01/15/2014   No results found for: PTT    Comfortable this morning. Reports that he has voided since pulling his Foley out. Nurse does not confirm this. Bladder scan earlier showed 100 cc. S intact in right groin. Will remove and check tomorrow. No evidence of hematoma. Right foot is well perfused. 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

## 2015-06-26 NOTE — Evaluation (Signed)
Physical Therapy Evaluation Patient Details Name: [name] MRN: [id number] DOB: [birth year] Today's Date: 06/26/2015   History of Present Illness  Pt admit with Right external iliac, common femoral and profundus endarterectomy with Dacron patch angioplasty.  Clinical Impression  Pt admitted with above diagnosis. Pt currently with functional limitations due to the deficits listed below (see PT Problem List). Pt limited by pain and hypotension today.  Should progress well. Willfollow acutely.  Recommend NH stay with therapy prior to d/c home as pt lives alone.   Pt will benefit from skilled PT to increase their independence and safety with mobility to allow discharge to the venue listed below.      Follow Up Recommendations SNF;Supervision/Assistance - 24 hour    Equipment Recommendations  None recommended by PT    Recommendations for Other Services       Precautions / Restrictions Precautions Precautions: Fall Restrictions Weight Bearing Restrictions: No      Mobility  Bed Mobility Overal bed mobility: Needs Assistance;+2 for physical assistance Bed Mobility: Supine to Sit     Supine to sit: Mod assist;+2 for physical assistance     General bed mobility comments: needed mod assist for LEs and trunk elevation partially due to pain.  Took incr time  Transfers Overall transfer level: Needs assistance Equipment used: Rolling walker (2 wheeled) Transfers: Sit to/from [employer] Sit to Stand: Mod assist;+2 physical assistance;From elevated surface Stand pivot transfers: Mod assist;+2 physical assistance       General transfer comment: Pt needed cues for hand placement.  Pt having difficulty following commands for safety.  Upon standing, pt with right LE pain and had to sit back down after a few seconds standing.  On 2nd attempt, pt was able to take pivotal steps around to chair but needed mod assist due to posterior lean.    Ambulation/Gait                 Stairs            Wheelchair Mobility    Modified Rankin (Stroke Patients Only)       Balance Overall balance assessment: Needs assistance;History of Falls Sitting-balance support: No upper extremity supported;Feet supported Sitting balance-Leahy Scale: Fair   Postural control: Posterior lean Standing balance support: Bilateral upper extremity supported;During functional activity Standing balance-Leahy Scale: Poor Standing balance comment: requiring UE support due to right LE tryuing to unweight right LE by using RW.  Unsteady with posterior lean.                              Pertinent Vitals/Pain Pain Assessment: Faces Faces Pain Scale: Hurts whole lot Pain Location: right LE Pain Descriptors / Indicators: Aching;Sore Pain Intervention(s): Limited activity within patient's tolerance;Monitored during session;Repositioned;Patient requesting pain meds-RN notified  74-82bpm, 107/42 initial BP, 88/45 standing BP, 108/48 BP once in chair.  O2 on 1L 91%.      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  pt lost his  wife  ~2 yrs ago.       Hand Dominance   Dominant Hand: Right    Extremity/Trunk Assessment   Upper Extremity Assessment: Defer to OT evaluation           Lower Extremity Assessment: Generalized weakness;RLE deficits/detail RLE Deficits / Details: appears grossly 3-/5    Cervical / Trunk Assessment:  Kyphotic  Communication   Communication: No difficulties  Cognition Arousal/Alertness: Awake/alert Behavior During Therapy: Anxious Overall Cognitive Status: History of cognitive impairments - at baseline                      General Comments      Exercises General Exercises - Lower Extremity Ankle Circles/Pumps: AROM;Both;10 reps;Seated Long Arc Quad: AROM;Both;10 reps;Seated Hip Flexion/Marching: AROM;Both;10 reps;Seated      Assessment/Plan    PT Assessment Patient needs continued PT services  PT Diagnosis Generalized weakness;Acute pain   PT Problem List Decreased balance;Decreased mobility;Decreased activity tolerance;Decreased knowledge of use of DME;Decreased safety awareness;Decreased knowledge of precautions;Decreased strength;Pain  PT Treatment Interventions DME instruction;Gait training;Functional mobility training;Therapeutic activities;Therapeutic exercise;Balance training;Patient/family education;Stair training   PT Goals (Current goals can be found in the Care Plan section) Acute Rehab PT Goals Patient Stated Goal: to get better PT Goal Formulation: With patient Time For Goal Achievement: 07/10/15 Potential to Achieve Goals: Good    Frequency Min 3X/week   Barriers to discharge Decreased caregiver support      Co-evaluation               End of Session Equipment Utilized During Treatment: Gait belt;Oxygen Activity Tolerance: Patient limited by fatigue;Patient limited by pain (limited by hypotension) Patient left: in chair;with call bell/phone within reach;with chair alarm set Nurse Communication: Mobility status;Patient requests pain meds         Time: 1027-1056 PT Time Calculation (min) (ACUTE ONLY): 29 min   Charges:   PT Evaluation $Initial PT Evaluation Tier I: 1 Procedure PT Treatments $Therapeutic Activity: 8-22 mins   PT G CodesBerline Lopes 07-03-15, 12:28 PM Lamar Meter,PT Acute  Rehabilitation [phone] (pager)

## 2015-06-26 NOTE — Progress Notes (Addendum)
VASCULAR LAB PRELIMINARY  ARTERIAL  ABI completed:    RIGHT    LEFT    PRESSURE WAVEFORM  PRESSURE WAVEFORM  BRACHIAL 120 Triphasic BRACHIAL 130 Triphasic  DP 51 Severely dampened monophasic DP 71 Severely dampened monophasic  AT   AT    PT 51 Monophasic PT 80 Monophasic  PER   PER    GREAT TOE  NA GREAT TOE  NA    RIGHT LEFT  ABI 0.39 0.62    The right ABI is suggestive of severe arterial insufficiency. The left ABI is suggestive of moderate arterial insufficiency.  Preliminary results discussed with Lianne Cure, PA-C.  06/26/2015 1:06 PM Gertie Fey, RVT, RDCS, RDMS

## 2015-06-27 MED ORDER — OXYCODONE HCL 5 MG PO TABS: 5.0000 mg | ORAL_TABLET | Freq: Four times a day (QID) | ORAL | Status: DC | PRN

## 2015-06-27 NOTE — Progress Notes (Signed)
Occupational Therapy Evaluation Patient Details Name: [name] MRN: [id number] DOB: 09-09-1927 Today's Date: 06/27/2015    History of Present Illness Pt admit with Right external iliac, common femoral and profundus endarterectomy with Dacron patch angioplasty.   Clinical Impression   Patient presenting with deconditioning and decreased ADL & functional mobility independence. Patient independent -> mod I PTA. Patient currently functioning at an overall min assist level and poor safety awareness/judgement. Patient will benefit from acute OT to increase overall independence in the areas of ADLs, functional mobility, and overall safety in order to safely discharge to venue listed below.     Follow Up Recommendations  SNF;Supervision/Assistance - 24 hour    Equipment Recommendations  Other (comment) (TBD next venue of care)    Recommendations for Other Services  None at this time   Precautions / Restrictions Precautions Precautions: Fall Restrictions Weight Bearing Restrictions: No    Mobility Bed Mobility Overal bed mobility: Needs Assistance Bed Mobility: Supine to Sit     Supine to sit: Supervision     General bed mobility comments: Supervision for safety, pt would not let therapist assist. Increased time needed.   Transfers Overall transfer level: Needs assistance Equipment used: None Transfers: Sit to/from Stand Sit to Stand: Supervision General transfer comment: Pt performed sit<>stand when therapist had back turned, not listening to therapists recommendation. No walker in front of patient. Pt then attempted to stand with RW, using both hands on handles. Therapist encouraged pushing from bed, patient tended to just do things his own way. Pt stood ~1 minute before needing to sit due to RLE (quad area) pain.     Balance Overall balance assessment: Needs assistance Sitting-balance support: No upper extremity supported;Feet supported Sitting balance-Leahy Scale: Fair     Standing balance support: No upper extremity supported Standing balance-Leahy Scale: Poor    ADL Overall ADL's : Needs assistance/impaired   General ADL Comments: Pt with decreased cognition, unsure if this is premorbid as no family present. Encouraged patient to get OOB -> recliner and explained the importance of OOB throughout the day, not staying in bed. Pt with decreased safety awareness, not listening to therapist 's recommendations. Patient insisted on getting out of right side of bed and walking around bed -> recliner. Prior to standing, therapist attempted to don gait belt, but pt refused to let therapist donn. Instead, pt took gait belt and put it over his head, donning himself. Pt stood from EOB and would not let therapist hold gait belt "I need to do this myself, I need confidence". Therapist reiterated importance of safety and use of gait belt to decrease risk of fall. While in standing, pt with pain in RLE (quad area). Pt sat back onto bed and insisted on lying back down. Pt stated he would lay down for a while and then get back up, therapist set bed alarm and told patient to call staff for assistance OOB.    Vision Additional Comments: no change from baseline          Pertinent Vitals/Pain Pain Assessment: Faces Faces Pain Scale: Hurts whole lot Pain Location: RLE (quad area) Pain Descriptors / Indicators: Discomfort;Grimacing;Guarding Pain Intervention(s): Repositioned;Monitored during session;Limited activity within patient's tolerance     Hand Dominance Right   Extremity/Trunk Assessment Upper Extremity Assessment Upper Extremity Assessment: Difficult to assess due to impaired cognition (Pt with difficulty following commands due to anxiety)   Lower Extremity Assessment Lower Extremity Assessment: Defer to PT evaluation   Cervical / Trunk Assessment Cervical /  Trunk Assessment: Kyphotic   Communication Communication Communication: No difficulties   Cognition  Arousal/Alertness: Awake/alert Behavior During Therapy: Anxious Overall Cognitive Status: History of cognitive impairments - at baseline              Home Living Family/patient expects to be discharged to:: Private residence Living Arrangements: Alone Available Help at Discharge: Family;Friend(s);Available PRN/intermittently Type of Home: House Home Access: Stairs to enter [employer] of Steps: 2 Entrance Stairs-Rails: None Home Layout: One level     Bathroom Shower/Tub: Tub/shower unit;Curtain   Bathroom Toilet: Handicapped height Bathroom Accessibility: Yes   Home Equipment: Environmental consultant - 2 wheels;Crutches;Wheelchair - [employer];Shower seat;Grab bars - tub/shower    Prior Functioning/Environment Level of Independence: Independent        Comments: 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  pt lost his wife  ~2 yrs ago.      OT Diagnosis: Generalized weakness;Acute pain   OT Problem List: Decreased strength;Decreased activity tolerance;Impaired balance (sitting and/or standing);Decreased safety awareness;Pain;Decreased cognition   OT Treatment/Interventions: Self-care/ADL training;Therapeutic exercise;Energy conservation;DME and/or AE instruction;Balance training;Therapeutic activities;Patient/family education;Cognitive remediation/compensation    OT Goals(Current goals can be found in the care plan section) Acute Rehab OT Goals Patient Stated Goal: be confident in myself OT Goal Formulation: With patient Time For Goal Achievement: 07/11/15 Potential to Achieve Goals: Fair ADL Goals Pt Will Perform Grooming: with supervision;standing Pt Will Perform Lower Body Bathing: with supervision;sit to/from stand Pt Will Perform Lower Body Dressing: with supervision;sit to/from stand Pt Will Transfer to Toilet: with supervision;bedside commode;ambulating Pt/caregiver will Perform Home Exercise Program: Increased strength;Both  right and left upper extremity;With theraputty;With written HEP provided;With Supervision  OT Frequency: Min 2X/week   Barriers to D/C: Decreased caregiver support  Patient lives alone with only intermittent assistance   End of Session Equipment Utilized During Treatment: Gait belt;Rolling walker Nurse Communication: Mobility status;Other (comment) (Patient's non compliant behavior during OT eval/treat)  Activity Tolerance: Treatment limited secondary to agitation Patient left: in bed;with call bell/phone within reach;with bed alarm set   Time: 4098-1191 OT Time Calculation (min): 24 min Charges:  OT General Charges $OT Visit: 1 Procedure OT Evaluation $Initial OT Evaluation Tier I: 1 Procedure OT Treatments $Self Care/Home Management : 8-22 mins  Shayli Altemose , MS, OTR/L, CLT Pager: [phone]  06/27/2015, 10:39 AM

## 2015-06-27 NOTE — Progress Notes (Signed)
CSW spoke with pt at 3:30pm concerning DC plan- pt is still agreeable to try SNF at [facility].  Facility is able to accept pt tomorrow (7/23- per Dr note anticipate pt being ready to DC then)  CSW will continue to follow.  Merlyn Lot, LCSWA Clinical Social Worker [phone]

## 2015-06-27 NOTE — Clinical Social Work Note (Signed)
Clinical Social Work Assessment  Patient Details  Name: [name] MRN: [id number] Date of Birth: 1927/05/28  Date of referral:  06/27/15               Reason for consult:  Facility Placement                Permission sought to share information with:  Oceanographer granted to share information::  Yes, Verbal Permission Granted  Name::        Agency::  [facility]  Relationship::     Contact Information:     Housing/Transportation Living arrangements for the past 2 months:  Single Family Home Source of Information:  Patient Patient Interpreter Needed:  None Criminal Activity/Legal Involvement Pertinent to Current Situation/Hospitalization:  No - Comment as needed Significant Relationships:  Adult Children, Siblings Lives with:  Self Do you feel safe going back to the place where you live?  No Need for family participation in patient care:  No (Coment)  Care giving concerns:  Pt lives at home alone and admits that he gets dizzy and is concerned about falling while his is alone   Office manager / plan:  CSW discussed PT recommendation for rehab at SNF prior to returning home  Employment status:  Retired Health and safety inspector:  Medicare PT Recommendations:  Skilled Holiday representative, 24 Hour Supervision Information / Referral to community resources:  Skilled Nursing Facility  Patient/Family's Response to care:  Patient is agreeable to short term SNF and states that he has been to [facility] in the past after a surgery.  Pt states that he will at least give it a try.    Patient/Family's Understanding of and Emotional Response to Diagnosis, Current Treatment, and Prognosis:  Unclear- pt appears to be concerned with condition but from RN and PT/OT report he is not willing to fully participate with care  Emotional Assessment Appearance:  Appears stated age Attitude/Demeanor/Rapport:  Inconsistent, Flirtatious (Comment) (held hand and would  not let go/ on the phone with family and calling me "pretty little girl" while i was sitting in the room) Affect (typically observed):  Defensive, Pleasant Orientation:  Oriented to Self, Oriented to Place, Oriented to Situation Alcohol / Substance use:  Not Applicable Psych involvement (Current and /or in the community):  No (Comment)  Discharge Needs  Concerns to be addressed:  Care Coordination Readmission within the last 30 days:  Yes Current discharge risk:  Physical Impairment, Lives alone Barriers to Discharge:  Continued Medical Work up   [employer], LCSW 06/27/2015, 12:47 PM

## 2015-06-27 NOTE — Progress Notes (Signed)
Patient ID: [name], male   DOB: [birth year], 81 y.o.   MRN: [id number] Comfortable. Reports soreness in the right thigh below his incision. No hematoma. Denies any rest pain in his right foot. Reports voiding without difficulty.  Feet warm and well-perfused. ABIs 0.4 right. Known SFA occlusion.  Continue to mobilize. Discussed with patient and family present. He wishes to go home and I suspect that he will be ready for discharge tomorrow. Has good family support. Does not want to go to a skilled nursing facility.

## 2015-06-27 NOTE — Clinical Social Work Placement (Signed)
   CLINICAL SOCIAL WORK PLACEMENT  NOTE  Date:  06/27/2015  Patient Details  Name: [name] MRN: [id number] Date of Birth: 1927/04/23  Clinical Social Work is seeking post-discharge placement for this patient at the Skilled  Nursing Facility level of care (*CSW will initial, date and re-position this form in  chart as items are completed):  Yes   Patient/family provided with [facility] Clinical Social Work Department's list of facilities offering this level of care within the geographic area requested by the patient (or if unable, by the patient's family).  Yes   Patient/family informed of their freedom to choose among providers that offer the needed level of care, that participate in Medicare, Medicaid or managed care program needed by the patient, have an available bed and are willing to accept the patient.  Yes   Patient/family informed of [facility]'s ownership interest in [facility] and [facility], as well as of the fact that they are under no obligation to receive care at these facilities.  PASRR submitted to EDS on 06/27/15     PASRR number received on 06/27/15     Existing PASRR number confirmed on       FL2 transmitted to all facilities in geographic area requested by pt/family on 06/27/15     FL2 transmitted to all facilities within larger geographic area on       Patient informed that his/her managed care company has contracts with or will negotiate with certain facilities, including the following:            Patient/family informed of bed offers received.  Patient chooses bed at       Physician recommends and patient chooses bed at      Patient to be transferred to   on  .  Patient to be transferred to facility by       Patient family notified on   of transfer.  Name of family member notified:        PHYSICIAN Please sign FL2     Additional Comment:    _______________________________________________ Izora Ribas, LCSW 06/27/2015, 12:52  PM

## 2015-06-28 NOTE — Progress Notes (Signed)
Report called to [place] at [facility]. Leslye Peer, RN

## 2015-06-28 NOTE — Progress Notes (Signed)
  Progress Note    06/28/2015 8:12 AM 3 Days Post-Op  Subjective:  Pain is better today  Afebrile HR 70's-80's  90's-110's systolic 98% RA  Filed Vitals:   06/28/15 0623  BP: 96/48  Pulse: 74  Temp: 98 F (36.7 C)  Resp: 18    Physical Exam: Cardiac:  regular Lungs:   Non labored Incisions:  Intact with steri strips Extremities:  Brisk PT doppler signal right   CBC    Component Value Date/Time   WBC 10.3 06/26/2015 0315   WBC 10.4* 03/31/2011 1429   RBC 2.76* 06/26/2015 0315   RBC 4.57 03/31/2011 1429   HGB 8.9* 06/26/2015 0315   HGB 14.8 03/31/2011 1429   HCT 26.6* 06/26/2015 0315   HCT 44.5 03/31/2011 1429   PLT 110* 06/26/2015 0315   PLT 157 03/31/2011 1429   MCV 96.4 06/26/2015 0315   MCV 97.4 03/31/2011 1429   MCH 32.2 06/26/2015 0315   MCH 32.4 03/31/2011 1429   MCHC 33.5 06/26/2015 0315   MCHC 33.3 03/31/2011 1429   RDW 15.6* 06/26/2015 0315   RDW 15.7* 03/31/2011 1429   LYMPHSABS 2.1 05/13/2015 1000   LYMPHSABS 5.4* 03/31/2011 1429   MONOABS 0.4 05/13/2015 1000   MONOABS 0.5 03/31/2011 1429   EOSABS 0.1 05/13/2015 1000   EOSABS 0.2 03/31/2011 1429   BASOSABS 0.0 05/13/2015 1000   BASOSABS 0.1 03/31/2011 1429    BMET    Component Value Date/Time   NA 143 06/26/2015 0315   K 3.6 06/26/2015 0315   CL 109 06/26/2015 0315   CO2 27 06/26/2015 0315   GLUCOSE 118* 06/26/2015 0315   BUN 19 06/26/2015 0315   CREATININE 1.81* 06/26/2015 0315   CREATININE 1.31 08/20/2013 0952   CALCIUM 7.8* 06/26/2015 0315   GFRNONAA 32* 06/26/2015 0315   GFRAA 37* 06/26/2015 0315    INR    Component Value Date/Time   INR 1.18 06/24/2015 1302     Intake/Output Summary (Last 24 hours) at 06/28/15 1610 Last data filed at 06/27/15 2000  Gross per 24 hour  Intake    960 ml  Output    675 ml  Net    285 ml     Assessment:  81 y.o. male is s/p:  1Right external iliac, common femoral and profundus endarterectomy with Dacron patch angioplasty  3 Days  Post-Op  Plan: -pt doing well this am with doppler signal in the right PT -Feet warm and well-perfused. ABIs 0.4 right. Known SFA occlusion -pain is better today -dry dressing to right groin daily -discharge back to SNF today -f/u with Dr. [name] in 2 weeks.   Doreatha Massed, PA-C [facility] [phone] 06/28/2015 8:12 AM    I agree with the above.  Patient ready to go home.  No pain.  Headaches gone.  Groin ok  Durene Cal

## 2015-06-28 NOTE — Clinical Social Work Placement (Signed)
   CLINICAL SOCIAL WORK PLACEMENT  NOTE  Date:  06/28/2015  Patient Details  Name: [name] MRN: [id number] Date of Birth: January 13, 1927  Clinical Social Work is seeking post-discharge placement for this patient at the Skilled  Nursing Facility level of care (*CSW will initial, date and re-position this form in  chart as items are completed):  Yes   Patient/family provided with [facility] Clinical Social Work Department's list of facilities offering this level of care within the geographic area requested by the patient (or if unable, by the patient's family).  Yes   Patient/family informed of their freedom to choose among providers that offer the needed level of care, that participate in Medicare, Medicaid or managed care program needed by the patient, have an available bed and are willing to accept the patient.  Yes   Patient/family informed of [facility]'s ownership interest in [facility] and [facility], as well as of the fact that they are under no obligation to receive care at these facilities.  PASRR submitted to EDS on 06/27/15     PASRR number received on 06/27/15     Existing PASRR number confirmed on       FL2 transmitted to all facilities in geographic area requested by pt/family on 06/27/15     FL2 transmitted to all facilities within larger geographic area on       Patient informed that his/her managed care company has contracts with or will negotiate with certain facilities, including the following:            Patient/family informed of bed offers received.  Patient chooses bed at [facility]     Physician recommends and patient chooses bed at      Patient to be transferred to [facility] on 06/28/15.  Patient to be transferred to facility by [facility]     Patient family notified on 06/28/15 of transfer.  Name of family member notified:  Greg-son via phone     PHYSICIAN Please sign FL2     Additional Comment:     _______________________________________________ Marnee Spring, LCSW 06/28/2015, 11:47 AM

## 2015-06-28 NOTE — Discharge Summary (Signed)
Discharge Summary     [name] May 01, 1927 81 y.o. 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 male was referred by Dr. Delton See for evaluation of a diffuse descending thoracic aortic aneurysm measuring up to 5.1 cm in maximum diameter. Patient has a remote history of coronary artery bypass grafting and is had PTCA and stent interventions since that time. He is stable from a cardiac standpoint. A recent CT scan revealed diffuse dilatation of the descending thoracic aorta. Patient states that he is having severe right buttock thigh and calf pain with short distance ambulation which is quite severely limiting him. He occasionally will have rest discomfort in the right foot. He has no history of gangrene nonhealing ulcers infection. He does have a pacemaker in place. He continues to smoke a third to a quarter of a pack of cigarettes per day.  Hospital Course:  The patient was admitted to the hospital and taken to the operating room on 06/25/2015 and underwent: Right external iliac, common femoral and profundus endarterectomy with Dacron patch angioplasty    The pt tolerated the procedure well and was transported to the PACU in good condition.   On the night of surgery, he did pull out his foley and did have some bleeing from his penis.  ABI's on 06/26/15:  RIGHT    LEFT    PRESSURE WAVEFORM  PRESSURE WAVEFORM  BRACHIAL 120 Triphasic BRACHIAL 130 Triphasic  DP 51 Severely dampened monophasic DP 71 Severely dampened monophasic  AT   AT    PT 51 Monophasic PT 80 Monophasic  PER   PER    GREAT TOE  NA GREAT TOE  NA    RIGHT LEFT  ABI 0.39 0.62       Feet warm and well-perfused. ABIs 0.4 right. Known SFA occlusion.  He did have an increase in his creatinine from 1.4 to 1.8.  Will need  to monitor.  By POD 3, the pt is doing well and is discharged back to the SNF.  The remainder of the hospital course consisted of increasing mobilization and increasing intake of solids without difficulty.  CBC    Component Value Date/Time   WBC 10.3 06/26/2015 0315   WBC 10.4* 03/31/2011 1429   RBC 2.76* 06/26/2015 0315   RBC 4.57 03/31/2011 1429   HGB 8.9* 06/26/2015 0315   HGB 14.8 03/31/2011 1429   HCT 26.6* 06/26/2015 0315   HCT 44.5 03/31/2011 1429   PLT 110* 06/26/2015 0315   PLT 157 03/31/2011 1429   MCV 96.4 06/26/2015 0315   MCV 97.4 03/31/2011 1429   MCH 32.2 06/26/2015 0315   MCH 32.4 03/31/2011 1429   MCHC 33.5 06/26/2015 0315   MCHC 33.3 03/31/2011 1429   RDW 15.6* 06/26/2015 0315   RDW 15.7* 03/31/2011 1429   LYMPHSABS 2.1 05/13/2015 1000   LYMPHSABS 5.4* 03/31/2011 1429   MONOABS 0.4 05/13/2015 1000   MONOABS 0.5 03/31/2011 1429   EOSABS 0.1 05/13/2015 1000   EOSABS 0.2 03/31/2011 1429   BASOSABS 0.0 05/13/2015 1000   BASOSABS 0.1 03/31/2011 1429    BMET    Component Value Date/Time   NA 143 06/26/2015 0315   K 3.6 06/26/2015 0315   CL 109 06/26/2015 0315   CO2 27 06/26/2015 0315   GLUCOSE 118* 06/26/2015 0315   BUN 19 06/26/2015 0315  CREATININE 1.81* 06/26/2015 0315   CREATININE 1.31 08/20/2013 0952   CALCIUM 7.8* 06/26/2015 0315   GFRNONAA 32* 06/26/2015 0315   GFRAA 37* 06/26/2015 0315       Discharge Instructions    Call MD for:  redness, tenderness, or signs of infection (pain, swelling, bleeding, redness, odor or green/yellow discharge around incision site)    Complete by:  As directed      Call MD for:  severe or increased pain, loss or decreased feeling  in affected limb(s)    Complete by:  As directed      Call MD for:  temperature >100.5    Complete by:  As directed      Discharge wound care:    Complete by:  As directed   Wash the groin wound with soap and water daily and pat dry. (No tub bath-only shower)  Then put a dry  gauze or washcloth there to keep this area dry daily and as needed.  Do not use Vaseline or neosporin on your incisions.  Only use soap and water on your incisions and then protect and keep dry.     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 PAD (peripheral artery disease) 06/10/2015  . Elevated troponin 05/13/2015  . Nontraumatic cortical hemorrhage of cerebral hemisphere 01/31/2015  . Essential hypertension 01/31/2015  . Orthostatic hypotension 01/31/2015  . HLD (hyperlipidemia) 01/31/2015  . Coronary artery disease involving native coronary artery of native heart with angina pectoris 01/31/2015  . PVD (peripheral vascular disease) 01/31/2015  . Cardiac pacemaker in situ 01/31/2015  . Right sided weakness   . Stroke   . ICH (intracerebral hemorrhage) 11/17/2014  . CAP (community acquired pneumonia) 06/09/2014  . CKD (chronic kidney disease) stage 3, GFR 30-59 ml/min 06/09/2014  . CHF (congestive heart failure) 06/09/2014  . Unstable angina 01/16/2014  . COPD with acute exacerbation 01/04/2014  . Influenza with respiratory manifestations 01/02/2014  . Acute on chronic kidney failure 01/02/2014  . Leukocytosis, unspecified 01/02/2014  . Malnutrition of moderate degree 01/01/2014  . Acute bronchitis 12/30/2013  . Chronic combined systolic and diastolic CHF (congestive heart failure) 12/30/2013  . NSTEMI (non-ST elevated myocardial infarction) 12/30/2013  . CAD (coronary artery disease) of artery bypass graft 12/13/2013  . Sick sinus syndrome 12/13/2013  . Acute on chronic combined systolic and diastolic CHF (congestive heart failure) 05/04/2013  . HTN (hypertension)  05/04/2013  . Hyperlipidemia 05/04/2013  . Tobacco abuse 05/04/2013  . History of CVA (cerebrovascular accident) 05/04/2013  . Thrombocytopenia 05/04/2013  . Occlusion and stenosis of carotid artery without mention of cerebral infarction 02/07/2012   Past Medical History  Diagnosis Date  . Hypertension   . CAD (coronary artery disease)     a. s/p MI in 1988;  b. s/p CABG in 2000 (Dr. Barry Dienes) x 4, LIMA to LAD, SVG to diagonal, SVG to [facility], SVG to PDA;  c. 12/2013 NSTEMI/abnl MV;  d. 01/2014 Cath/PCI: native 3VD, VG->RCA 100, VG->OM aneurysmal 40ost/m, 20m(5.0x12 Veriflex BMS), VG->Diag aneurysmal, LIMA->LAD nl.  Marland Kitchen  TIA (transient ischemic attack)   . Hyperlipidemia   . COPD (chronic obstructive pulmonary disease)   . CVA (cerebral infarction) 07/2011    Remote left brain infarct with sensory deficits and had a left internal carotid stent placed by Dr. [name] at that time, no recurrent CVA or TIAs or CNS events.  Marland Kitchen PAD (peripheral artery disease)     Stable. Had a past RFPBG by Dr. [name] in 2009. this graft is occluded with reconstitution in the popliteal artery, which was patent. Right tibial was occluded and 2-vessel runoff on angiography done by [name] in 12/2010.  . Sick sinus syndrome     PPM implanted for this and syncope   . Chronic systolic CHF (congestive heart failure)     Hospitalization for CHF from 05/04/13-05/06/13. Treated medically. Had possible pneumonia with short course of antibiotics.  . Abdominal bruit     Loud  . AAA (abdominal aortic aneurysm)     Has a known AAA of 3.2 x 3.4 cm by abdominal untrasound 03/27/12. Followup abdominal ultrasound on 03/29/13 showed a diameter of 3.6 cm, which is stable  . Peripheral vascular disease   . Hypothyroid     On supplement "don't know if I take RX or not for my thyroid" (01/16/2014)  . Stroke   . Syncope     tilt table test/8.10.01/orthostatic hypotension  . Orthostatic hypotension   . Presence of permanent cardiac pacemaker     . Pneumonia        Medication List    TAKE these medications        acetaminophen 325 MG tablet  Commonly known as:  TYLENOL  Take 650 mg by mouth every 12 (twelve) hours.     amLODipine 10 MG tablet  Commonly known as:  NORVASC  Take 0.5 mg by mouth daily after supper.     aspirin 81 MG tablet  Take 81 mg by mouth daily.     atorvastatin 40 MG tablet  Commonly known as:  LIPITOR  Take 1 tablet (40 mg total) by mouth daily at 6 PM.     clopidogrel 75 MG tablet  Commonly known as:  PLAVIX  Take 75 mg by mouth at bedtime.     feeding supplement Liqd  Take 1 Container by mouth 3 (three) times daily between meals.     feeding supplement (ENSURE COMPLETE) Liqd  Take 237 mLs by mouth 2 (two) times daily between meals.     isosorbide mononitrate 30 MG 24 hr tablet  Commonly known as:  IMDUR  Take 1 tablet (30 mg total) by mouth daily.     metoprolol 200 MG 24 hr tablet  Commonly known as:  TOPROL-XL  Take 100 mg by mouth daily.     nitroGLYCERIN 0.4 MG SL tablet  Commonly known as:  NITROSTAT  Place 1 tablet (0.4 mg total) under the tongue every 5 (five) minutes as needed. For chest pain     oxyCODONE 5 MG immediate release tablet  Commonly known as:  ROXICODONE  Take 1 tablet (5 mg total) by mouth every 6 (six) hours as needed.     potassium chloride 10 MEQ tablet  Commonly known as:  K-DUR,KLOR-CON  Take 10 mEq by mouth once.     sertraline 100 MG tablet  Commonly known as:  ZOLOFT  Take 50 mg by mouth at bedtime.     tamsulosin 0.4 MG Caps capsule  Commonly known as:  FLOMAX  Take 0.4 mg by  mouth daily after supper.        Prescriptions given: 1.  Roxicodone #30 No Refill  Instructions: 1.  Wash the groin wound with soap and water daily and pat dry. (No tub bath-only shower)  Then put a dry gauze or washcloth there to keep this area dry daily and as needed.  Do not use Vaseline or neosporin on your incisions.  Only use soap and water on your  incisions and then protect and keep dry.  Disposition: SNF  Patient's condition: is Good  Follow up: 1. Dr. [name] in 2 weeks 2. Dr. Doristine Counter in the next week to check creatinine.  Doreatha Massed, PA-C [facility] [phone] 06/28/2015  8:15 AM  - For VQI Registry use --- Instructions: Press F2 to tab through selections.  Delete question if not applicable.   Post-op:  Wound infection: No  Graft infection: No  Transfusion: No  If yes, n/a units given New Arrhythmia: No Ipsilateral amputation: No,  Minor,  BKA,  AKA Discharge patency: [x ] Primary,  Primary assisted,  Secondary,  Occluded Patency judged by: [ x] Dopper only,  Palpable graft pulse,  Palpable distal pulse,  ABI inc. > 0.15,  Duplex Discharge ABI: R 0.4, L 0.6 Discharge TBI: R , L  D/C Ambulatory Status: Ambulatory  Complications: MI: No,  Troponin only,  EKG or Clinical CHF: No Resp failure:No,  Pneumonia,  Ventilator Chg in renal function: No,  Inc. Cr > 0.5,  Temp. Dialysis,  Permanent dialysis Stroke: No,  Minor,  Major Return to OR: No  Reason for return to OR:  Bleeding,  Infection,  Thrombosis,  Revision  Discharge medications: Statin use:  yes ASA use:  yes Plavix use:  yes Beta blocker use: yes Coumadin use: no

## 2015-06-28 NOTE — Progress Notes (Signed)
Clinical Social Work  CSW faxed DC summary to [facility] who is agreeable to accept patient today. CSW prepared DC packet with FL2, DC summary and hard scripts included. CSW informed patient and son Tammy Sours) of DC plans. Patient prefers [facility] and is aware of no guarantee of payment. RN to call report. CSW arranged [facility] for 1:30pm pick up.  CSW is signing off but available if needed.  Unk Lightning, LCSW Weekend Coverage

## 2015-06-30 ENCOUNTER — Encounter: Payer: Self-pay | Admitting: Adult Health

## 2015-06-30 ENCOUNTER — Non-Acute Institutional Stay (SKILLED_NURSING_FACILITY): Payer: Medicare Other | Admitting: Adult Health

## 2015-06-30 ENCOUNTER — Telehealth: Payer: Self-pay | Admitting: Vascular Surgery

## 2015-06-30 DIAGNOSIS — I739 Peripheral vascular disease, unspecified: Secondary | ICD-10-CM | POA: Diagnosis not present

## 2015-06-30 DIAGNOSIS — I25119 Atherosclerotic heart disease of native coronary artery with unspecified angina pectoris: Secondary | ICD-10-CM | POA: Diagnosis not present

## 2015-06-30 DIAGNOSIS — E785 Hyperlipidemia, unspecified: Secondary | ICD-10-CM

## 2015-06-30 DIAGNOSIS — N400 Enlarged prostate without lower urinary tract symptoms: Secondary | ICD-10-CM

## 2015-06-30 DIAGNOSIS — I10 Essential (primary) hypertension: Secondary | ICD-10-CM | POA: Diagnosis not present

## 2015-06-30 NOTE — Telephone Encounter (Signed)
-----   Message from Phillips Odor, RN sent at 06/27/2015  4:45 PM EDT ----- Regarding: Joyce Gross log; also needs 2 wk. f/u with [facility]   ----- Message -----    From: Dara Lords, PA-C    Sent: 06/27/2015   4:03 PM      To: [facility] Charge Pool  S/p Right external iliac, common femoral and profundus endarterectomy with Dacron patch angioplasty  F/u with [facility] in 2 weeks.  Thanks

## 2015-06-30 NOTE — Telephone Encounter (Signed)
Unable to reach pt, voicemail is not available, mailed letter, dpm

## 2015-06-30 NOTE — Progress Notes (Signed)
Patient ID: [name], male   DOB: 11/27/1927, 81 y.o.   MRN: [id number]    Facility: [facility]      Allergies  Allergen Reactions  . Tramadol Nausea And Vomiting  . Pravachol Other (See Comments)    myalgias  . Simvastatin Other (See Comments)    myalgia  . Terazosin Other (See Comments)    unknown    Chief Complaint  Patient presents with  . Hospitalization Follow-up    HPI:  He has been hospitalized for a right external iliac common femoral and profundus endarterectomy with dacron patch and angioplasty. He is here for short term rehab with his goal to return back home. He could not tolerate the oxycodone due to confusion. His family reports that he is not allergic to ultram and takes this medication at home and this medication was effective for his pain relief.    Past Medical History  Diagnosis Date  . Hypertension   . CAD (coronary artery disease)     a. s/p MI in 1988;  b. s/p CABG in 2000 (Dr. Barry Dienes) x 4, LIMA to LAD, SVG to diagonal, SVG to [facility], SVG to PDA;  c. 12/2013 NSTEMI/abnl MV;  d. 01/2014 Cath/PCI: native 3VD, VG->RCA 100, VG->OM aneurysmal 40ost/m, 61m(5.0x12 Veriflex BMS), VG->Diag aneurysmal, LIMA->LAD nl.  . TIA (transient ischemic attack)   . Hyperlipidemia   . COPD (chronic obstructive pulmonary disease)   . CVA (cerebral infarction) 07/2011    Remote left brain infarct with sensory deficits and had a left internal carotid stent placed by Dr. [name] at that time, no recurrent CVA or TIAs or CNS events.  Marland Kitchen PAD (peripheral artery disease)     Stable. Had a past RFPBG by Dr. [name] in 2009. this graft is occluded with reconstitution in the popliteal artery, which was patent. Right tibial was occluded and 2-vessel runoff on angiography done by [name] in 12/2010.  . Sick sinus syndrome     PPM implanted for this and syncope   . Chronic systolic CHF (congestive heart failure)     Hospitalization for CHF from 05/04/13-05/06/13. Treated  medically. Had possible pneumonia with short course of antibiotics.  . Abdominal bruit     Loud  . AAA (abdominal aortic aneurysm)     Has a known AAA of 3.2 x 3.4 cm by abdominal untrasound 03/27/12. Followup abdominal ultrasound on 03/29/13 showed a diameter of 3.6 cm, which is stable  . Peripheral vascular disease   . Hypothyroid     On supplement "don't know if I take RX or not for my thyroid" (01/16/2014)  . Stroke   . Syncope     tilt table test/8.10.01/orthostatic hypotension  . Orthostatic hypotension   . Presence of permanent cardiac pacemaker   . Pneumonia     Past Surgical History  Procedure Laterality Date  . Femoral bypass Right ?2001  . Carotid stent Left 06/23/11    By [name]  . Coronary artery bypass graft  2000    1st in 2000 (Dr. Barry Dienes) x 4, LIMA to LAD, SVG to diagonal, SVG to OM4, SVG to PDA. Re-cath in May 2012 with patent LIMA to LAD, patent SVG to diagonal, patent SVG to OM with left-to-right collaterals to an occluded right.  . Coronary angioplasty with stent placement  2001-01/16/2014    "think today makes #4" (01/16/2014)  . Cardiac catheterization  2000  . Inguinal hernia repair Right 1980's  . Insert / replace / remove pacemaker  implanted 2003 by Dr Jenne Campus with gen change (MDT [id number]) 08/2010 by [facility]  . Cataract extraction w/ intraocular lens  implant, bilateral Bilateral   . Abdominal aortagram N/A 02/22/2012    Procedure: ABDOMINAL AORTAGRAM;  Surgeon: Nada Libman, MD;  Location[facility] C[facility] LAB;  Service: Cardiovascular;  Laterality: N/A;  . Carotid angiogram Bilateral 02/22/2012    Procedure: CAROTID ANGIOGRAM;  Surgeon: Nada Libman, MD;  Location: [facility] CATH LAB;  Service: Cardiovascular;  Laterality: Bilateral;  . Left heart catheterization with coronary/graft angiogram N/A 01/16/2014    Procedure: LEFT HEART CATHETERIZATION WITH Isabel Caprice;  Surgeon: Kathleene Hazel, MD;  Location: [facility] CATH LAB;  Service: Cardiovascular;   Laterality: N/A;  . Abdominal aortagram    . Colonoscopy    . Peripheral vascular catheterization N/A 06/24/2015    Procedure: Abdominal Aortogram;  Surgeon: Nada Libman, MD;  Location: [facility] INVASIVE CV LAB;  Service: Cardiovascular;  Laterality: N/A;  . Endarterectomy femoral Right 06/25/2015    Procedure: ENDARTERECTOMY RIGHT FEMORAL ARTERY ;  Surgeon: Larina Earthly, MD;  Location: [facility] OR;  Service: Vascular;  Laterality: Right;  . Patch angioplasty Right 06/25/2015    Procedure: RIGHT COMMON FEMORAL ARTERY AND PROFUNDA PATCH ANGIOPLASTY USING HEMASHIELD PLATINUM FINESSE PATCH ;  Surgeon: Larina Earthly, MD;  Location: [facility] OR;  Service: Vascular;  Laterality: Right;    VITAL SIGNS BP 118/59 mmHg  Pulse 72  Ht 5\' 9"  (1.753 m)  Wt 140 lb (63.504 kg)  BMI 20.67 kg/m2  SpO2 98%  Patient's Medications  New Prescriptions   No medications on file  Previous Medications   ACETAMINOPHEN (TYLENOL) 325 MG TABLET    Take 650 mg by mouth every 6 (six) hours as needed. ALSO 325 mg twice daily   AMLODIPINE (NORVASC) 10 MG TABLET    Take 0.5 mg by mouth daily after supper.   ASPIRIN 81 MG TABLET    Take 81 mg by mouth daily.   ATORVASTATIN (LIPITOR) 40 MG TABLET    Take 1 tablet (40 mg total) by mouth daily at 6 PM.   CLOPIDOGREL (PLAVIX) 75 MG TABLET    Take 75 mg by mouth at bedtime.    FEEDING SUPPLEMENT (BOOST HIGH PROTEIN) LIQD    Take 1 Container by mouth 3 (three) times daily between meals.   FEEDING SUPPLEMENT, ENSURE COMPLETE, (ENSURE COMPLETE) LIQD    Take 237 mLs by mouth 2 (two) times daily between meals.   ISOSORBIDE MONONITRATE (IMDUR) 30 MG 24 HR TABLET    Take 1 tablet (30 mg total) by mouth daily.   METOPROLOL (TOPROL-XL) 200 MG 24 HR TABLET    Take 100 mg by mouth daily.   NITROGLYCERIN (NITROSTAT) 0.4 MG SL TABLET    Place 1 tablet (0.4 mg total) under the tongue every 5 (five) minutes as needed. For chest pain   SERTRALINE (ZOLOFT) 100 MG TABLET    Take 50 mg by mouth at bedtime.    TAMSULOSIN HCL (FLOMAX) 0.4 MG CAPS    Take 0.4 mg by mouth daily after supper.   Modified Medications   No medications on file  Discontinued Medications   OXYCODONE (ROXICODONE) 5 MG IMMEDIATE RELEASE TABLET    Take 1 tablet (5 mg total) by mouth every 6 (six) hours as needed.   POTASSIUM CHLORIDE (K-DUR,KLOR-CON) 10 MEQ TABLET    Take 10 mEq by mouth once.     SIGNIFICANT DIAGNOSTIC EXAMS  06-26-15: ABI: The right ABI is suggestive  of severe arterial insufficiency. The left ABI is suggestive of moderate arterial insufficiency.  LABS REVIEWED:   06-24-15: wbc 9.0; hgb 14.7; hct 43.2; mcv 93.7; plt 141; glucose 114; bun 14; creat 1.45; k+3.4; na++142; liver normal albumin 3.1 06-26-15: wbc 15.4; hgb 11.6; hct 34.8; mcv 94.8; plt 122; glucose 118; bun 19; creat 1.81; k+3.6; na++143      Review of Systems  Constitutional: Negative for appetite change and fatigue.  HENT: Negative for congestion.   Respiratory: Negative for cough, chest tightness and shortness of breath.   Cardiovascular: Negative for chest pain, palpitations and leg swelling.  Gastrointestinal: Negative for nausea, abdominal pain, diarrhea and constipation.  Musculoskeletal: Negative for myalgias and arthralgias.  Skin: Negative for pallor.  Neurological: Negative for dizziness.  Psychiatric/Behavioral: The patient is not nervous/anxious.       Physical Exam  Vitals reviewed. Constitutional: No distress.  Thin   Eyes: Conjunctivae are normal.  Neck: Neck supple. No JVD present. No thyromegaly present.  Cardiovascular: Normal rate, regular rhythm and intact distal pulses.   Respiratory: Effort normal and breath sounds normal. No respiratory distress. He has no wheezes.  GI: Soft. Bowel sounds are normal. He exhibits no distension. There is no tenderness.  Musculoskeletal: He exhibits no edema.  Able to move all extremities   Lymphadenopathy:    He has no cervical adenopathy.  Neurological: He is alert.    Skin: Skin is warm and dry. He is not diaphoretic.  Right groin incision line without signs of infection presenbt   Psychiatric: He has a normal mood and affect.       ASSESSMENT/ PLAN:  1. PAD: is status post  right external iliac common femoral and profundus endarterectomy with dacron patch and angioplasty: will continue therapy as directed for gait; balance; adl retraining. Will continue plavix 75 mg daily and asa 81 mg daily. He does have pain his oxycodone was stopped as he could not tolerate this medication; will begin ultram 50 mg every 6 hours as needed and will monitor  2. CAD: is stable without complaint of chest pain present; will continue asa 81 mg daily; imdur 30 mg daily   3. Hypertension: will continue norvasc 5 mg daily; toprol xl 100 mg daily and will monitor  4. Dyslipidemia: will continue lipitor 40 mg daily   5. BPH: will continue flomax 0.4 mg daily   6. Depression: is stable will continue 50 mg nightly and will monitor   Will check bmp this week.    Time spent with patient  50  minutes >50% time spent counseling; reviewing medical record; tests; labs; and developing future plan of care   Synthia Innocent NP [facility]  Contact [phone] Monday through Friday 8am- 5pm  After hours call [phone]

## 2015-07-01 ENCOUNTER — Non-Acute Institutional Stay (SKILLED_NURSING_FACILITY): Payer: Medicare Other | Admitting: Internal Medicine

## 2015-07-01 DIAGNOSIS — E440 Moderate protein-calorie malnutrition: Secondary | ICD-10-CM | POA: Diagnosis not present

## 2015-07-01 DIAGNOSIS — N183 Chronic kidney disease, stage 3 unspecified: Secondary | ICD-10-CM

## 2015-07-01 DIAGNOSIS — Z8673 Personal history of transient ischemic attack (TIA), and cerebral infarction without residual deficits: Secondary | ICD-10-CM | POA: Diagnosis not present

## 2015-07-01 DIAGNOSIS — Z720 Tobacco use: Secondary | ICD-10-CM | POA: Diagnosis not present

## 2015-07-01 DIAGNOSIS — E785 Hyperlipidemia, unspecified: Secondary | ICD-10-CM | POA: Diagnosis not present

## 2015-07-01 DIAGNOSIS — I739 Peripheral vascular disease, unspecified: Secondary | ICD-10-CM | POA: Diagnosis not present

## 2015-07-01 DIAGNOSIS — Z9889 Other specified postprocedural states: Secondary | ICD-10-CM | POA: Diagnosis not present

## 2015-07-01 DIAGNOSIS — I5042 Chronic combined systolic (congestive) and diastolic (congestive) heart failure: Secondary | ICD-10-CM | POA: Diagnosis not present

## 2015-07-01 DIAGNOSIS — I25119 Atherosclerotic heart disease of native coronary artery with unspecified angina pectoris: Secondary | ICD-10-CM

## 2015-07-01 DIAGNOSIS — Z950 Presence of cardiac pacemaker: Secondary | ICD-10-CM | POA: Diagnosis not present

## 2015-07-01 DIAGNOSIS — J438 Other emphysema: Secondary | ICD-10-CM | POA: Diagnosis not present

## 2015-07-01 DIAGNOSIS — I10 Essential (primary) hypertension: Secondary | ICD-10-CM

## 2015-07-02 ENCOUNTER — Encounter (HOSPITAL_COMMUNITY): Payer: Self-pay | Admitting: Emergency Medicine

## 2015-07-02 ENCOUNTER — Observation Stay (HOSPITAL_COMMUNITY)
Admission: EM | Admit: 2015-07-02 | Discharge: 2015-07-04 | Disposition: A | Discharge status: Home / Self Care | Payer: Medicare Other | Attending: Internal Medicine | Admitting: Internal Medicine

## 2015-07-02 DIAGNOSIS — Z7982 Long term (current) use of aspirin: Secondary | ICD-10-CM | POA: Diagnosis not present

## 2015-07-02 DIAGNOSIS — J449 Chronic obstructive pulmonary disease, unspecified: Secondary | ICD-10-CM | POA: Diagnosis not present

## 2015-07-02 DIAGNOSIS — R319 Hematuria, unspecified: Secondary | ICD-10-CM

## 2015-07-02 DIAGNOSIS — I5022 Chronic systolic (congestive) heart failure: Secondary | ICD-10-CM | POA: Insufficient documentation

## 2015-07-02 DIAGNOSIS — I252 Old myocardial infarction: Secondary | ICD-10-CM | POA: Insufficient documentation

## 2015-07-02 DIAGNOSIS — Z8673 Personal history of transient ischemic attack (TIA), and cerebral infarction without residual deficits: Secondary | ICD-10-CM | POA: Diagnosis not present

## 2015-07-02 DIAGNOSIS — I10 Essential (primary) hypertension: Secondary | ICD-10-CM | POA: Insufficient documentation

## 2015-07-02 DIAGNOSIS — Z95828 Presence of other vascular implants and grafts: Secondary | ICD-10-CM | POA: Diagnosis not present

## 2015-07-02 DIAGNOSIS — R531 Weakness: Secondary | ICD-10-CM | POA: Diagnosis not present

## 2015-07-02 DIAGNOSIS — I2510 Atherosclerotic heart disease of native coronary artery without angina pectoris: Secondary | ICD-10-CM | POA: Diagnosis not present

## 2015-07-02 DIAGNOSIS — Y846 Urinary catheterization as the cause of abnormal reaction of the patient, or of later complication, without mention of misadventure at the time of the procedure: Secondary | ICD-10-CM | POA: Diagnosis not present

## 2015-07-02 DIAGNOSIS — R310 Gross hematuria: Secondary | ICD-10-CM | POA: Diagnosis present

## 2015-07-02 DIAGNOSIS — S3730XA Unspecified injury of urethra, initial encounter: Principal | ICD-10-CM | POA: Insufficient documentation

## 2015-07-02 DIAGNOSIS — E785 Hyperlipidemia, unspecified: Secondary | ICD-10-CM | POA: Diagnosis not present

## 2015-07-02 DIAGNOSIS — D62 Acute posthemorrhagic anemia: Secondary | ICD-10-CM | POA: Diagnosis not present

## 2015-07-02 DIAGNOSIS — I714 Abdominal aortic aneurysm, without rupture: Secondary | ICD-10-CM | POA: Insufficient documentation

## 2015-07-02 DIAGNOSIS — T819XXA Unspecified complication of procedure, initial encounter: Secondary | ICD-10-CM | POA: Diagnosis present

## 2015-07-02 DIAGNOSIS — Z950 Presence of cardiac pacemaker: Secondary | ICD-10-CM | POA: Diagnosis not present

## 2015-07-02 DIAGNOSIS — N289 Disorder of kidney and ureter, unspecified: Secondary | ICD-10-CM

## 2015-07-02 DIAGNOSIS — Z87891 Personal history of nicotine dependence: Secondary | ICD-10-CM | POA: Diagnosis not present

## 2015-07-02 DIAGNOSIS — I739 Peripheral vascular disease, unspecified: Secondary | ICD-10-CM | POA: Insufficient documentation

## 2015-07-02 DIAGNOSIS — R55 Syncope and collapse: Secondary | ICD-10-CM | POA: Insufficient documentation

## 2015-07-02 DIAGNOSIS — Z79899 Other long term (current) drug therapy: Secondary | ICD-10-CM | POA: Diagnosis not present

## 2015-07-02 DIAGNOSIS — Z7902 Long term (current) use of antithrombotics/antiplatelets: Secondary | ICD-10-CM | POA: Insufficient documentation

## 2015-07-02 DIAGNOSIS — E039 Hypothyroidism, unspecified: Secondary | ICD-10-CM | POA: Diagnosis not present

## 2015-07-02 DIAGNOSIS — Z951 Presence of aortocoronary bypass graft: Secondary | ICD-10-CM | POA: Insufficient documentation

## 2015-07-02 DIAGNOSIS — I495 Sick sinus syndrome: Secondary | ICD-10-CM | POA: Diagnosis not present

## 2015-07-02 DIAGNOSIS — I959 Hypotension, unspecified: Secondary | ICD-10-CM

## 2015-07-02 DIAGNOSIS — Z9889 Other specified postprocedural states: Secondary | ICD-10-CM

## 2015-07-02 DIAGNOSIS — R262 Difficulty in walking, not elsewhere classified: Secondary | ICD-10-CM | POA: Diagnosis not present

## 2015-07-02 DIAGNOSIS — I97618 Postprocedural hemorrhage and hematoma of a circulatory system organ or structure following other circulatory system procedure: Secondary | ICD-10-CM | POA: Diagnosis not present

## 2015-07-02 DIAGNOSIS — D500 Iron deficiency anemia secondary to blood loss (chronic): Secondary | ICD-10-CM

## 2015-07-02 HISTORY — DX: Headache, unspecified: R51.9

## 2015-07-02 HISTORY — DX: Headache: R51

## 2015-07-02 LAB — TYPE AND SCREEN
ABO/RH(D): O POS
Antibody Screen: NEGATIVE

## 2015-07-02 LAB — I-STAT CG4 LACTIC ACID, ED: LACTIC ACID, VENOUS: 1.89 mmol/L (ref 0.5–2.0)

## 2015-07-02 MED ORDER — SODIUM CHLORIDE 0.9 % IV BOLUS (SEPSIS)
1000.0000 mL | Freq: Once | INTRAVENOUS | Status: AC
  Administered 2015-07-02: 1000 mL via INTRAVENOUS

## 2015-07-02 NOTE — ED Provider Notes (Signed)
CSN: [id number]     Arrival date & time 07/02/15  2307 History  This chart was scribed for Dione Booze, MD by Doreatha Martin, ED Scribe. This patient was seen in room A11C/A11C and the patient's care was started at 11:15 PM.       Chief Complaint  Patient presents with  . Penile Bleeding   . Leg Pain   The history is provided by the patient and the EMS personnel. No language interpreter was used.    HPI Comments: [name] is a 81 y.o. male BIBA with Hx of HTN, CAD, TIA, HLD, COPD, CVA, PAD, sick sinus syndrome, chronic systolic CHF, abdominal bruit, AAA, peripheral vascular disease, stroke who presents to the Emergency Department complaining of moderate, gradually improving penile and scrotal 9/10 pain onset earlier today while lying in the bed. Pt states associated penile bleeding (2 episodes today, per EMS), nausea and right leg pain that radiates down to the knee. Pt states he was lying in the bed when he noticed the bleeding. Pt has a PSHx of right femoral endarterectomy and right patch angioplasty on 06/25/15. He denies vomiting, fever, chills, abdominal pain, back pain and difficulty urinating.   Past Medical History  Diagnosis Date  . Hypertension   . CAD (coronary artery disease)     a. s/p MI in 1988;  b. s/p CABG in 2000 (Dr. Barry Dienes) x 4, LIMA to LAD, SVG to diagonal, SVG to [facility], SVG to PDA;  c. 12/2013 NSTEMI/abnl MV;  d. 01/2014 Cath/PCI: native 3VD, VG->RCA 100, VG->OM aneurysmal 40ost/m, 82m(5.0x12 Veriflex BMS), VG->Diag aneurysmal, LIMA->LAD nl.  . TIA (transient ischemic attack)   . Hyperlipidemia   . COPD (chronic obstructive pulmonary disease)   . CVA (cerebral infarction) 07/2011    Remote left brain infarct with sensory deficits and had a left internal carotid stent placed by Dr. [name] at that time, no recurrent CVA or TIAs or CNS events.  Marland Kitchen PAD (peripheral artery disease)     Stable. Had a past RFPBG by Dr. [name] in 2009. this graft is occluded with reconstitution in the  popliteal artery, which was patent. Right tibial was occluded and 2-vessel runoff on angiography done by [name] in 12/2010.  . Sick sinus syndrome     PPM implanted for this and syncope   . Chronic systolic CHF (congestive heart failure)     Hospitalization for CHF from 05/04/13-05/06/13. Treated medically. Had possible pneumonia with short course of antibiotics.  . Abdominal bruit     Loud  . AAA (abdominal aortic aneurysm)     Has a known AAA of 3.2 x 3.4 cm by abdominal untrasound 03/27/12. Followup abdominal ultrasound on 03/29/13 showed a diameter of 3.6 cm, which is stable  . Peripheral vascular disease   . Hypothyroid     On supplement "don't know if I take RX or not for my thyroid" (01/16/2014)  . Stroke   . Syncope     tilt table test/8.10.01/orthostatic hypotension  . Orthostatic hypotension   . Presence of permanent cardiac pacemaker   . Pneumonia    Past Surgical History  Procedure Laterality Date  . Femoral bypass Right ?2001  . Carotid stent Left 06/23/11    By [name]  . Coronary artery bypass graft  2000    1st in 2000 (Dr. Barry Dienes) x 4, LIMA to LAD, SVG to diagonal, SVG to OM4, SVG to PDA. Re-cath in May 2012 with patent LIMA to LAD, patent SVG to diagonal, patent  SVG to OM with left-to-right collaterals to an occluded right.  . Coronary angioplasty with stent placement  2001-01/16/2014    "think today makes #4" (01/16/2014)  . Cardiac catheterization  2000  . Inguinal hernia repair Right 1980's  . Insert / replace / remove pacemaker      implanted 2003 by Dr Jenne Campus with gen change (MDT [id number]) 08/2010 by [facility]  . Cataract extraction w/ intraocular lens  implant, bilateral Bilateral   . Abdominal aortagram N/A 02/22/2012    Procedure: ABDOMINAL AORTAGRAM;  Surgeon: Nada Libman, MD;  Location: [facility] CATH LAB;  Service: Cardiovascular;  Laterality: N/A;  . Carotid angiogram Bilateral 02/22/2012    Procedure: CAROTID ANGIOGRAM;  Surgeon: Nada Libman, MD;  Location: [facility]  CATH LAB;  Service: Cardiovascular;  Laterality: Bilateral;  . Left heart catheterization with coronary/graft angiogram N/A 01/16/2014    Procedure: LEFT HEART CATHETERIZATION WITH Isabel Caprice;  Surgeon: Kathleene Hazel, MD;  Location: [facility] CATH LAB;  Service: Cardiovascular;  Laterality: N/A;  . Abdominal aortagram    . Colonoscopy    . Peripheral vascular catheterization N/A 06/24/2015    Procedure: Abdominal Aortogram;  Surgeon: Nada Libman, MD;  Location: [facility] INVASIVE CV LAB;  Service: Cardiovascular;  Laterality: N/A;  . Endarterectomy femoral Right 06/25/2015    Procedure: ENDARTERECTOMY RIGHT FEMORAL ARTERY ;  Surgeon: Larina Earthly, MD;  Location: [facility] OR;  Service: Vascular;  Laterality: Right;  . Patch angioplasty Right 06/25/2015    Procedure: RIGHT COMMON FEMORAL ARTERY AND PROFUNDA PATCH ANGIOPLASTY USING HEMASHIELD PLATINUM FINESSE PATCH ;  Surgeon: Larina Earthly, MD;  Location: [facility] OR;  Service: Vascular;  Laterality: Right;   Family History  Problem Relation Age of Onset  . Cancer Brother 60    oral   . Prostate cancer Brother   . Hypertension Brother   . Hyperlipidemia Brother   . Lung disease Father     black lung disease   History  Substance Use Topics  . Smoking status: Current Every Day Smoker -- 0.50 packs/day for 68 years    Types: Cigarettes  . Smokeless tobacco: Never Used  . Alcohol Use: No    Review of Systems  Constitutional: Negative for fever and chills.  Gastrointestinal: Positive for nausea. Negative for vomiting and abdominal pain.  Genitourinary: Positive for discharge and penile pain. Negative for difficulty urinating.  Musculoskeletal: Positive for arthralgias. Negative for back pain.  All other systems reviewed and are negative.   Allergies  Pravachol; Simvastatin; and Terazosin  Home Medications   Prior to Admission medications   Medication Sig Start Date End Date Taking? Authorizing Provider  acetaminophen (TYLENOL) 325  MG tablet Take 650 mg by mouth every 6 (six) hours as needed. ALSO 325 mg twice daily    Historical Provider, MD  amLODipine (NORVASC) 10 MG tablet Take 0.5 mg by mouth daily after supper.    Historical Provider, MD  aspirin 81 MG tablet Take 81 mg by mouth daily.    Historical Provider, MD  atorvastatin (LIPITOR) 40 MG tablet Take 1 tablet (40 mg total) by mouth daily at 6 PM. 05/14/15   Brittainy Sherlynn Carbon, PA-C  clopidogrel (PLAVIX) 75 MG tablet Take 75 mg by mouth at bedtime.     Historical Provider, MD  feeding supplement (BOOST HIGH PROTEIN) LIQD Take 1 Container by mouth 3 (three) times daily between meals.    Historical Provider, MD  feeding supplement, ENSURE COMPLETE, (ENSURE COMPLETE) LIQD Take 237 mLs by  mouth 2 (two) times daily between meals. 01/05/14   Renae Fickle, MD  isosorbide mononitrate (IMDUR) 30 MG 24 hr tablet Take 1 tablet (30 mg total) by mouth daily. 05/28/15   Leone Brand, NP  metoprolol (TOPROL-XL) 200 MG 24 hr tablet Take 100 mg by mouth daily.    Historical Provider, MD  nitroGLYCERIN (NITROSTAT) 0.4 MG SL tablet Place 1 tablet (0.4 mg total) under the tongue every 5 (five) minutes as needed. For chest pain 06/11/14   Hollice Espy, MD  sertraline (ZOLOFT) 100 MG tablet Take 50 mg by mouth at bedtime.    Historical Provider, MD  Tamsulosin HCl (FLOMAX) 0.4 MG CAPS Take 0.4 mg by mouth daily after supper.     Historical Provider, MD   BP 112/59 mmHg  Pulse 70  Temp(Src) 98.2 F (36.8 C) (Oral)  Resp 18  Ht 5\' 9"  (1.753 m)  Wt 140 lb (63.504 kg)  BMI 20.67 kg/m2  SpO2 100% Physical Exam  Constitutional: He is oriented to person, place, and time. He appears well-developed and well-nourished.  HENT:  Head: Normocephalic and atraumatic.  Eyes: EOM are normal. Pupils are equal, round, and reactive to light.  Neck: Normal range of motion. Neck supple. No JVD present.  Cardiovascular: Normal rate, regular rhythm and normal heart sounds.   No murmur  heard. Pulmonary/Chest: Effort normal and breath sounds normal. He has no wheezes. He has no rales. He exhibits no tenderness.  Abdominal: Soft. Bowel sounds are normal. He exhibits no distension and no mass. There is no tenderness.  Genitourinary: Circumcised.  Small amount of bright red blood exuding from the urinary meatus.   Musculoskeletal: Normal range of motion. He exhibits no edema.  Right inguinal incision. Healing well, but there is evidence of a small amount of recent bleeding from the superior aspect. Right femoral pulse present but weak, with moderate tenderness to palpation. No bruits. Distal pulses very weak but symmetric. Both extremities warm with prompt capillary refill.   Lymphadenopathy:    He has no cervical adenopathy.  Neurological: He is alert and oriented to person, place, and time. No cranial nerve deficit. Coordination normal.  Skin: Skin is warm and dry. No rash noted.  Psychiatric: He has a normal mood and affect. His behavior is normal.  Nursing note and vitals reviewed.   ED Course  Procedures (including critical care time)\ DIAGNOSTIC STUDIES: Oxygen Saturation is 100% on RA, normal by my interpretation.    COORDINATION OF CARE: 11:21 PM Discussed treatment plan with pt at bedside and pt agreed to plan.   Labs Review Results for orders placed or performed during the hospital encounter of 07/02/15  Comprehensive metabolic panel  Result Value Ref Range   Sodium 141 135 - 145 mmol/L   Potassium 3.7 3.5 - 5.1 mmol/L   Chloride 109 101 - 111 mmol/L   CO2 26 22 - 32 mmol/L   Glucose, Bld 104 (H) 65 - 99 mg/dL   BUN 24 (H) 6 - 20 mg/dL   Creatinine, Ser 4.09 (H) 0.61 - 1.24 mg/dL   Calcium 8.1 (L) 8.9 - 10.3 mg/dL   Total Protein 5.3 (L) 6.5 - 8.1 g/dL   Albumin 2.5 (L) 3.5 - 5.0 g/dL   AST 22 15 - 41 U/L   ALT 17 17 - 63 U/L   Alkaline Phosphatase 68 38 - 126 U/L   Total Bilirubin 0.6 0.3 - 1.2 mg/dL   GFR calc non Af Amer 34 (L) >60  mL/min   GFR  calc Af Amer 40 (L) >60 mL/min   Anion gap 6 5 - 15  CBC with Differential  Result Value Ref Range   WBC 7.7 4.0 - 10.5 K/uL   RBC 2.47 (L) 4.22 - 5.81 MIL/uL   Hemoglobin 8.1 (L) 13.0 - 17.0 g/dL   HCT 96.0 (L) 45.4 - 09.8 %   MCV 98.4 78.0 - 100.0 fL   MCH 32.8 26.0 - 34.0 pg   MCHC 33.3 30.0 - 36.0 g/dL   RDW 11.9 (H) 14.7 - 82.9 %   Platelets 246 150 - 400 K/uL   Neutrophils Relative % 55 43 - 77 %   Neutro Abs 4.3 1.7 - 7.7 K/uL   Lymphocytes Relative 35 12 - 46 %   Lymphs Abs 2.7 0.7 - 4.0 K/uL   Monocytes Relative 6 3 - 12 %   Monocytes Absolute 0.5 0.1 - 1.0 K/uL   Eosinophils Relative 3 0 - 5 %   Eosinophils Absolute 0.2 0.0 - 0.7 K/uL   Basophils Relative 1 0 - 1 %   Basophils Absolute 0.1 0.0 - 0.1 K/uL  Urinalysis, Routine w reflex microscopic (not at [facility])  Result Value Ref Range   Color, Urine RED (A) YELLOW   APPearance TURBID (A) CLEAR   Specific Gravity, Urine 1.023 1.005 - 1.030   pH 5.5 5.0 - 8.0   Glucose, UA NEGATIVE NEGATIVE mg/dL   Hgb urine dipstick LARGE (A) NEGATIVE   Bilirubin Urine SMALL (A) NEGATIVE   Ketones, ur 15 (A) NEGATIVE mg/dL   Protein, ur 562 (A) NEGATIVE mg/dL   Urobilinogen, UA 1.0 0.0 - 1.0 mg/dL   Nitrite NEGATIVE NEGATIVE   Leukocytes, UA SMALL (A) NEGATIVE  Urine microscopic-add on  Result Value Ref Range   WBC, UA 11-20 <3 WBC/hpf   RBC / HPF TOO NUMEROUS TO COUNT <3 RBC/hpf   Bacteria, UA FEW (A) RARE  I-Stat CG4 Lactic Acid, ED  Result Value Ref Range   Lactic Acid, Venous 1.89 0.5 - 2.0 mmol/L  Type and screen for Red Blood Exchange  Result Value Ref Range   ABO/RH(D) O POS    Antibody Screen NEG    Sample Expiration 07/05/2015    CRITICAL CARE Performed by: ZHYQM,VHQIO Total critical care time: 35 minutes Critical care time was exclusive of separately billable procedures and treating other patients. Critical care was necessary to treat or prevent imminent or life-threatening deterioration. Critical care was time  spent personally by me on the following activities: development of treatment plan with patient and/or surrogate as well as nursing, discussions with consultants, evaluation of patient's response to treatment, examination of patient, obtaining history from patient or surrogate, ordering and performing treatments and interventions, ordering and review of laboratory studies, ordering and review of radiographic studies, pulse oximetry and re-evaluation of patient's condition.  MDM   Final diagnoses:  Postoperative hemorrhage involving circulatory system following other circulatory system procedure  Gross hematuria  Transient hypotension  Anemia due to blood loss  Renal insufficiency    Postoperative bleeding following endarterectomy of right femoral artery. No evidence of ongoing bleeding. Cause of bleeding from the penis is uncertain. No evidence of ongoing bleeding and no evidence of infection. She'll be after patient was placed in room, he was noted to become hypotensive with blood pressure dropping to 85/52 but without any change in heart rate. He was given a 1 L bolus and blood pressure has come up and has maybe been  stable since then. Old records are reviewed and he did have a femoral artery endarterectomy with patch done about one week ago. Discharge creatinine was elevated at 1.88, so he is not a candidate for CT angiography. I discussed case with Dr. Hart Rochester of vascular surgery who is familiar with the patient and stated that angiography is not necessary as long as he has palpable femoral pulse and distal pulses are detected by Doppler. Distal pulses are present and there is no evidence of vascular compromise. Lactic acid levels come back normal. Neck has been a slight drop in hemoglobin which is probably related to blood loss. Case is discussed with Dr. Julian Reil of [facility] who agrees to admit the patient.  I personally performed the services described in this documentation, which was  scribed in my presence. The recorded information has been reviewed and is accurate.       Dione Booze, MD 07/03/15 [phone]

## 2015-07-02 NOTE — ED Notes (Addendum)
Pt arrives via EMS from [facility], pt reports 2 episodes of bleeding from penis. Small laceration at meatus less than a millimeter. Recent arterectomy at our facility. Pain to R leg. Second episode of bleeding producing ~200 ML. Bright red, some clots. Pt taking aspirin and plavix.

## 2015-07-03 ENCOUNTER — Emergency Department (HOSPITAL_COMMUNITY): Payer: Medicare Other

## 2015-07-03 ENCOUNTER — Encounter (HOSPITAL_COMMUNITY): Payer: Self-pay | Admitting: General Practice

## 2015-07-03 DIAGNOSIS — I97618 Postprocedural hemorrhage and hematoma of a circulatory system organ or structure following other circulatory system procedure: Secondary | ICD-10-CM

## 2015-07-03 DIAGNOSIS — D62 Acute posthemorrhagic anemia: Secondary | ICD-10-CM | POA: Diagnosis present

## 2015-07-03 DIAGNOSIS — Z9889 Other specified postprocedural states: Secondary | ICD-10-CM | POA: Diagnosis present

## 2015-07-03 DIAGNOSIS — S3730XA Unspecified injury of urethra, initial encounter: Secondary | ICD-10-CM | POA: Diagnosis not present

## 2015-07-03 DIAGNOSIS — N4889 Other specified disorders of penis: Secondary | ICD-10-CM | POA: Diagnosis not present

## 2015-07-03 DIAGNOSIS — R310 Gross hematuria: Secondary | ICD-10-CM

## 2015-07-03 DIAGNOSIS — T819XXA Unspecified complication of procedure, initial encounter: Secondary | ICD-10-CM | POA: Diagnosis present

## 2015-07-03 DIAGNOSIS — D500 Iron deficiency anemia secondary to blood loss (chronic): Secondary | ICD-10-CM

## 2015-07-03 LAB — RETICULOCYTES
RBC.: 2.35 MIL/uL — ABNORMAL LOW (ref 4.22–5.81)
Retic Count, Absolute: 122.2 10*3/uL (ref 19.0–186.0)
Retic Ct Pct: 5.2 % — ABNORMAL HIGH (ref 0.4–3.1)

## 2015-07-03 LAB — CBC
HCT: 23.1 % — ABNORMAL LOW (ref 39.0–52.0)
HEMOGLOBIN: 7.5 g/dL — AB (ref 13.0–17.0)
MCH: 32.1 pg (ref 26.0–34.0)
MCHC: 32.5 g/dL (ref 30.0–36.0)
MCV: 98.7 fL (ref 78.0–100.0)
Platelets: 227 10*3/uL (ref 150–400)
RBC: 2.34 MIL/uL — ABNORMAL LOW (ref 4.22–5.81)
RDW: 16.8 % — AB (ref 11.5–15.5)
WBC: 9.1 10*3/uL (ref 4.0–10.5)

## 2015-07-03 LAB — CBC WITH DIFFERENTIAL/PLATELET
Basophils Absolute: 0.1 10*3/uL (ref 0.0–0.1)
Basophils Relative: 1 % (ref 0–1)
Eosinophils Absolute: 0.2 10*3/uL (ref 0.0–0.7)
Eosinophils Relative: 3 % (ref 0–5)
HCT: 24.3 % — ABNORMAL LOW (ref 39.0–52.0)
Hemoglobin: 8.1 g/dL — ABNORMAL LOW (ref 13.0–17.0)
Lymphocytes Relative: 35 % (ref 12–46)
Lymphs Abs: 2.7 10*3/uL (ref 0.7–4.0)
MCH: 32.8 pg (ref 26.0–34.0)
MCHC: 33.3 g/dL (ref 30.0–36.0)
MCV: 98.4 fL (ref 78.0–100.0)
MONO ABS: 0.5 10*3/uL (ref 0.1–1.0)
MONOS PCT: 6 % (ref 3–12)
NEUTROS ABS: 4.3 10*3/uL (ref 1.7–7.7)
Neutrophils Relative %: 55 % (ref 43–77)
PLATELETS: 246 10*3/uL (ref 150–400)
RBC: 2.47 MIL/uL — ABNORMAL LOW (ref 4.22–5.81)
RDW: 16.9 % — ABNORMAL HIGH (ref 11.5–15.5)
WBC: 7.7 10*3/uL (ref 4.0–10.5)

## 2015-07-03 LAB — PREPARE RBC (CROSSMATCH)

## 2015-07-03 LAB — URINALYSIS, ROUTINE W REFLEX MICROSCOPIC
Glucose, UA: NEGATIVE mg/dL
Ketones, ur: 15 mg/dL — AB
NITRITE: NEGATIVE
Protein, ur: 100 mg/dL — AB
SPECIFIC GRAVITY, URINE: 1.023 (ref 1.005–1.030)
Urobilinogen, UA: 1 mg/dL (ref 0.0–1.0)
pH: 5.5 (ref 5.0–8.0)

## 2015-07-03 LAB — COMPREHENSIVE METABOLIC PANEL
ALK PHOS: 68 U/L (ref 38–126)
ALT: 17 U/L (ref 17–63)
ANION GAP: 6 (ref 5–15)
AST: 22 U/L (ref 15–41)
Albumin: 2.5 g/dL — ABNORMAL LOW (ref 3.5–5.0)
BUN: 24 mg/dL — ABNORMAL HIGH (ref 6–20)
CO2: 26 mmol/L (ref 22–32)
CREATININE: 1.71 mg/dL — AB (ref 0.61–1.24)
Calcium: 8.1 mg/dL — ABNORMAL LOW (ref 8.9–10.3)
Chloride: 109 mmol/L (ref 101–111)
GFR calc non Af Amer: 34 mL/min — ABNORMAL LOW (ref >60)
GFR, EST AFRICAN AMERICAN: 40 mL/min — AB (ref >60)
GLUCOSE: 104 mg/dL — AB (ref 65–99)
Potassium: 3.7 mmol/L (ref 3.5–5.1)
Sodium: 141 mmol/L (ref 135–145)
Total Bilirubin: 0.6 mg/dL (ref 0.3–1.2)
Total Protein: 5.3 g/dL — ABNORMAL LOW (ref 6.5–8.1)

## 2015-07-03 LAB — URINE MICROSCOPIC-ADD ON

## 2015-07-03 LAB — HEMOGLOBIN AND HEMATOCRIT, BLOOD
HEMATOCRIT: 27.7 % — AB (ref 39.0–52.0)
Hemoglobin: 9.5 g/dL — ABNORMAL LOW (ref 13.0–17.0)

## 2015-07-03 LAB — IRON AND TIBC
Iron: 43 ug/dL — ABNORMAL LOW (ref 45–182)
SATURATION RATIOS: 20 % (ref 17.9–39.5)
TIBC: 218 ug/dL — ABNORMAL LOW (ref 250–450)
UIBC: 175 ug/dL

## 2015-07-03 LAB — VITAMIN B12: Vitamin B-12: 389 pg/mL (ref 180–914)

## 2015-07-03 LAB — FERRITIN: Ferritin: 50 ng/mL (ref 24–336)

## 2015-07-03 LAB — FOLATE: FOLATE: 21 ng/mL (ref >5.9)

## 2015-07-03 MED ORDER — SODIUM CHLORIDE 0.9 % IV SOLN
Freq: Once | INTRAVENOUS | Status: AC
  Administered 2015-07-03: 10 mL/h via INTRAVENOUS

## 2015-07-03 MED ORDER — ATORVASTATIN CALCIUM 40 MG PO TABS
40.0000 mg | ORAL_TABLET | Freq: Every day | ORAL | Status: DC
  Administered 2015-07-03: 40 mg via ORAL
  Filled 2015-07-03 (×2): qty 1

## 2015-07-03 MED ORDER — METOPROLOL SUCCINATE ER 100 MG PO TB24
100.0000 mg | ORAL_TABLET | Freq: Every day | ORAL | Status: DC
  Administered 2015-07-03 – 2015-07-04 (×2): 100 mg via ORAL
  Filled 2015-07-03 (×2): qty 1

## 2015-07-03 MED ORDER — DIPHENHYDRAMINE HCL 25 MG PO CAPS
25.0000 mg | ORAL_CAPSULE | Freq: Once | ORAL | Status: AC
  Administered 2015-07-03: 25 mg via ORAL
  Filled 2015-07-03: qty 1

## 2015-07-03 MED ORDER — SERTRALINE HCL 50 MG PO TABS
50.0000 mg | ORAL_TABLET | Freq: Every day | ORAL | Status: DC
  Administered 2015-07-03: 50 mg via ORAL
  Filled 2015-07-03 (×2): qty 1

## 2015-07-03 MED ORDER — AMLODIPINE BESYLATE 5 MG PO TABS
5.0000 mg | ORAL_TABLET | Freq: Every day | ORAL | Status: DC
  Administered 2015-07-03: 5 mg via ORAL
  Filled 2015-07-03 (×2): qty 1

## 2015-07-03 MED ORDER — OXYCODONE-ACETAMINOPHEN 5-325 MG PO TABS
1.0000 | ORAL_TABLET | ORAL | Status: DC | PRN
  Administered 2015-07-03 (×2): 1 via ORAL
  Filled 2015-07-03 (×2): qty 1

## 2015-07-03 MED ORDER — ACETAMINOPHEN 325 MG PO TABS: 650.0000 mg | ORAL_TABLET | Freq: Four times a day (QID) | ORAL | Status: DC | PRN

## 2015-07-03 MED ORDER — AMLODIPINE BESYLATE 10 MG PO TABS: 0.5000 mg | ORAL_TABLET | Freq: Every day | ORAL | Status: DC

## 2015-07-03 MED ORDER — ISOSORBIDE MONONITRATE ER 30 MG PO TB24
30.0000 mg | ORAL_TABLET | Freq: Every day | ORAL | Status: DC
  Administered 2015-07-03 – 2015-07-04 (×2): 30 mg via ORAL
  Filled 2015-07-03 (×2): qty 1

## 2015-07-03 MED ORDER — ACETAMINOPHEN 325 MG PO TABS
650.0000 mg | ORAL_TABLET | Freq: Once | ORAL | Status: AC
  Administered 2015-07-03: 650 mg via ORAL
  Filled 2015-07-03: qty 2

## 2015-07-03 MED ORDER — ENSURE ENLIVE PO LIQD
237.0000 mL | Freq: Two times a day (BID) | ORAL | Status: DC
  Administered 2015-07-03 – 2015-07-04 (×3): 237 mL via ORAL

## 2015-07-03 MED ORDER — FUROSEMIDE 10 MG/ML IJ SOLN
20.0000 mg | Freq: Once | INTRAMUSCULAR | Status: AC
  Administered 2015-07-03: 20 mg via INTRAVENOUS
  Filled 2015-07-03: qty 2

## 2015-07-03 MED ORDER — TAMSULOSIN HCL 0.4 MG PO CAPS
0.4000 mg | ORAL_CAPSULE | ORAL | Status: DC
  Administered 2015-07-03: 0.4 mg via ORAL
  Filled 2015-07-03: qty 1

## 2015-07-03 MED ORDER — SODIUM CHLORIDE 0.9 % IJ SOLN
3.0000 mL | Freq: Two times a day (BID) | INTRAMUSCULAR | Status: DC
  Administered 2015-07-03 – 2015-07-04 (×4): 3 mL via INTRAVENOUS

## 2015-07-03 NOTE — Progress Notes (Signed)
Pt arrived to unit. No complaints of pain. VSS. Pt oriented to unit. Call bell within reach. Will continue to monitor.   Sandrea Hammond RN

## 2015-07-03 NOTE — Progress Notes (Signed)
VASCULAR SURGERY  Addendum: I was not aware that he had a CT scan. I have reviewed this. He has some hematoma at the site of his recent surgery, that is not especially concerning. It would not explain his hematuria.  Waverly Ferrari, MD, FACS Beeper [phone]

## 2015-07-03 NOTE — Progress Notes (Signed)
Received report

## 2015-07-03 NOTE — Consult Note (Signed)
[facility]  Patient name: [name] MRN: [id number] DOB: 11/08/1927 Sex: male  REASON FOR CONSULT: bleeding from penis.  HPI: Behr Cislo is a 81 y.o. male who underwent a right external iliac artery, common femoral artery, and deep femoral artery endarterectomy with Dacron patch angioplasty by Dr. [name] on 06/25/2015. The patient had some postoperative anemia. This was asymptomatic. He was also noted to have some pain in his right thigh below the incision but no hematoma was noted. He was discharged back to his skilled nursing facility on 06/28/2015. He was readmitted by the medical service this morning with bleeding from his penis. Vascular surgery was consult.  He has no specific complaints except from bleeding from his penis. Currently he is not bleeding.   Past Medical History  Diagnosis Date  . Hypertension   . CAD (coronary artery disease)     a. s/p MI in 1988;  b. s/p CABG in 2000 (Dr. Barry Dienes) x 4, LIMA to LAD, SVG to diagonal, SVG to [facility], SVG to PDA;  c. 12/2013 NSTEMI/abnl MV;  d. 01/2014 Cath/PCI: native 3VD, VG->RCA 100, VG->OM aneurysmal 40ost/m, 28m(5.0x12 Veriflex BMS), VG->Diag aneurysmal, LIMA->LAD nl.  . TIA (transient ischemic attack)   . Hyperlipidemia   . COPD (chronic obstructive pulmonary disease)   . CVA (cerebral infarction) 07/2011    Remote left brain infarct with sensory deficits and had a left internal carotid stent placed by Dr. [name] at that time, no recurrent CVA or TIAs or CNS events.  Marland Kitchen PAD (peripheral artery disease)     Stable. Had a past RFPBG by Dr. [name] in 2009. this graft is occluded with reconstitution in the popliteal artery, which was patent. Right tibial was occluded and 2-vessel runoff on angiography done by [name] in 12/2010.  . Sick sinus syndrome     PPM implanted for this and syncope   . Chronic systolic CHF (congestive heart failure)     Hospitalization for CHF from 05/04/13-05/06/13. Treated  medically. Had possible pneumonia with short course of antibiotics.  . Abdominal bruit     Loud  . AAA (abdominal aortic aneurysm)     Has a known AAA of 3.2 x 3.4 cm by abdominal untrasound 03/27/12. Followup abdominal ultrasound on 03/29/13 showed a diameter of 3.6 cm, which is stable  . Peripheral vascular disease   . Hypothyroid     On supplement "don't know if I take RX or not for my thyroid" (01/16/2014)  . Stroke   . Syncope     tilt table test/8.10.01/orthostatic hypotension  . Orthostatic hypotension   . Presence of permanent cardiac pacemaker   . Pneumonia     Family History  Problem Relation Age of Onset  . Cancer Brother 60    oral   . Prostate cancer Brother   . Hypertension Brother   . Hyperlipidemia Brother   . Lung disease Father     black lung disease    SOCIAL HISTORY: History  Substance Use Topics  . Smoking status: Current Every Day Smoker -- 0.50 packs/day for 68 years    Types: Cigarettes  . Smokeless tobacco: Never Used  . Alcohol Use: No    Allergies  Allergen Reactions  . Pravachol Other (See Comments)    myalgias  . Simvastatin Other (See Comments)    myalgia  . Other Other (See Comments)    GREEN BEANS AND ONIONS  . Terazosin Other (See Comments)    unknown  Current Facility-Administered Medications  Medication Dose Route Frequency Provider Last Rate Last Dose  . 0.9 %  sodium chloride infusion   Intravenous Once Ramiro Harvest V, MD      . 0.9 %  sodium chloride infusion   Intravenous Once Ramiro Harvest V, MD      . acetaminophen (TYLENOL) tablet 650 mg  650 mg Oral Q6H PRN Hillary Bow, DO      . acetaminophen (TYLENOL) tablet 650 mg  650 mg Oral Once Ramiro Harvest V, MD      . amLODipine (NORVASC) tablet 5 mg  5 mg Oral QPC supper Hillary Bow, DO      . atorvastatin (LIPITOR) tablet 40 mg  40 mg Oral q1800 Hillary Bow, DO      . diphenhydrAMINE (BENADRYL) capsule 25 mg  25 mg Oral Once Ramiro Harvest V, MD      .  feeding supplement (ENSURE ENLIVE) (ENSURE ENLIVE) liquid 237 mL  237 mL Oral BID BM Hillary Bow, DO      . furosemide (LASIX) injection 20 mg  20 mg Intravenous Once Ramiro Harvest V, MD      . isosorbide mononitrate (IMDUR) 24 hr tablet 30 mg  30 mg Oral Daily Hillary Bow, DO      . metoprolol succinate (TOPROL-XL) 24 hr tablet 100 mg  100 mg Oral Daily Hillary Bow, DO      . oxyCODONE-acetaminophen (PERCOCET/ROXICET) 5-325 MG per tablet 1-2 tablet  1-2 tablet Oral Q4H PRN Rodolph Bong, MD      . sertraline (ZOLOFT) tablet 50 mg  50 mg Oral QHS Hillary Bow, DO      . sodium chloride 0.9 % injection 3 mL  3 mL Intravenous Q12H Hillary Bow, DO   3 mL at 07/03/15 0325  . tamsulosin (FLOMAX) capsule 0.4 mg  0.4 mg Oral PC supper Hillary Bow, DO        REVIEW OF SYSTEMS: Arly.Keller ] denotes positive finding; [  ] denotes negative finding CARDIOVASCULAR:   chest pain    chest pressure    palpitations    orthopnea    dyspnea on exertion    claudication    rest pain    DVT    phlebitis PULMONARY:    productive cough    asthma    wheezing NEUROLOGIC:    weakness   paresthesias   aphasia   amaurosis   dizziness HEMATOLOGIC:    bleeding problems    clotting disorders MUSCULOSKELETAL:   joint pain    joint swelling  leg swelling GASTROINTESTINAL:   blood in stool    hematemesis GENITOURINARY:    dysuria    hematuria PSYCHIATRIC:   history of major depression INTEGUMENTARY:   rashes   ulcers CONSTITUTIONAL:   fever    chills  PHYSICAL EXAM: Filed Vitals:   07/03/15 0200 07/03/15 0230 07/03/15 0331 07/03/15 0717  BP: 130/55 127/57 130/72 116/92  Pulse: 70 69 72 83  Temp:   98.3 F (36.8 C) 97.8 F (36.6 C)  TempSrc:   Oral Oral  Resp: Height:      Weight:   139 lb 12.4 oz (63.4 kg)   SpO2: 96% 97% 98% 98%   Body mass  index is 20.63 kg/(m^2). GENERAL: The patient  is a well-nourished male, in no acute distress. The vital signs are documented above. CARDIAC: There is a regular rate and rhythm.  VASCULAR: he has a palpable right femoral pulse. I cannot palpate pedal pulses. Both feet are warm and well-perfused. I do not see any bleeding from the right groin. PULMONARY: There is good air exchange bilaterally without wheezing or rales. ABDOMEN: Soft and non-tender with normal pitched bowel sounds.  MUSCULOSKELETAL: There are no major deformities. NEUROLOGIC: No focal weakness or paresthesias are detected. SKIN: There are no ulcers or rashes noted. PSYCHIATRIC: The patient has a normal affect.  DATA:  Lab Results  Component Value Date   WBC 9.1 07/03/2015   HGB 7.5* 07/03/2015   HCT 23.1* 07/03/2015   MCV 98.7 07/03/2015   PLT 227 07/03/2015   Lab Results  Component Value Date   NA 141 07/02/2015   K 3.7 07/02/2015   CL 109 07/02/2015   CO2 26 07/02/2015   Lab Results  Component Value Date   CREATININE 1.71* 07/02/2015   Lab Results  Component Value Date   INR 1.18 06/24/2015   INR 1.11 05/13/2015   INR 1.1* 01/15/2014   Lab Results  Component Value Date   HGBA1C 6.1* 07/05/2011   MEDICAL ISSUES: Status post endarterectomy of right external iliac artery, common femoral artery, and deep femoral artery: Currently I do not see any evidence of bleeding from the right groin. He had bleeding from his penis and I think urology evaluation would be indicated. We will follow his wound.  Waverly Ferrari [facility] [place] Beeper: [phone]

## 2015-07-03 NOTE — H&P (Signed)
[facility] History and Physical  [name] ZOX:[id number] DOB: 05-11-1927 DOA: 07/02/2015  Referring physician: EDP PCP: Delorse Lek, MD   Chief Complaint: Bleeding from op site   HPI: [name] is a 81 y.o. male who is s/p R femoral endarterectomy done on 7/20.  Patient is sent in to ED after he developed bleeding from his op site at tip of penis today at his NH.  He has leg pain that radiates down to the knee.  Does have dopplerable DP pulse in that foot.  Review of Systems: Systems reviewed.  As above, otherwise negative  Past Medical History  Diagnosis Date  . Hypertension   . CAD (coronary artery disease)     a. s/p MI in 1988;  b. s/p CABG in 2000 (Dr. Barry Dienes) x 4, LIMA to LAD, SVG to diagonal, SVG to [facility], SVG to PDA;  c. 12/2013 NSTEMI/abnl MV;  d. 01/2014 Cath/PCI: native 3VD, VG->RCA 100, VG->OM aneurysmal 40ost/m, 56m(5.0x12 Veriflex BMS), VG->Diag aneurysmal, LIMA->LAD nl.  . TIA (transient ischemic attack)   . Hyperlipidemia   . COPD (chronic obstructive pulmonary disease)   . CVA (cerebral infarction) 07/2011    Remote left brain infarct with sensory deficits and had a left internal carotid stent placed by Dr. [name] at that time, no recurrent CVA or TIAs or CNS events.  Marland Kitchen PAD (peripheral artery disease)     Stable. Had a past RFPBG by Dr. [name] in 2009. this graft is occluded with reconstitution in the popliteal artery, which was patent. Right tibial was occluded and 2-vessel runoff on angiography done by [name] in 12/2010.  . Sick sinus syndrome     PPM implanted for this and syncope   . Chronic systolic CHF (congestive heart failure)     Hospitalization for CHF from 05/04/13-05/06/13. Treated medically. Had possible pneumonia with short course of antibiotics.  . Abdominal bruit     Loud  . AAA (abdominal aortic aneurysm)     Has a known AAA of 3.2 x 3.4 cm by abdominal untrasound 03/27/12. Followup abdominal ultrasound on 03/29/13 showed a diameter of  3.6 cm, which is stable  . Peripheral vascular disease   . Hypothyroid     On supplement "don't know if I take RX or not for my thyroid" (01/16/2014)  . Stroke   . Syncope     tilt table test/8.10.01/orthostatic hypotension  . Orthostatic hypotension   . Presence of permanent cardiac pacemaker   . Pneumonia    Past Surgical History  Procedure Laterality Date  . Femoral bypass Right ?2001  . Carotid stent Left 06/23/11    By [name]  . Coronary artery bypass graft  2000    1st in 2000 (Dr. Barry Dienes) x 4, LIMA to LAD, SVG to diagonal, SVG to OM4, SVG to PDA. Re-cath in May 2012 with patent LIMA to LAD, patent SVG to diagonal, patent SVG to OM with left-to-right collaterals to an occluded right.  . Coronary angioplasty with stent placement  2001-01/16/2014    "think today makes #4" (01/16/2014)  . Cardiac catheterization  2000  . Inguinal hernia repair Right 1980's  . Insert / replace / remove pacemaker      implanted 2003 by Dr Jenne Campus with gen change (MDT [id number]) 08/2010 by [facility]  . Cataract extraction w/ intraocular lens  implant, bilateral Bilateral   . Abdominal aortagram N/A 02/22/2012    Procedure: ABDOMINAL AORTAGRAM;  Surgeon: Nada Libman, MD;  Location: [facility] CATH LAB;  Service: Cardiovascular;  Laterality: N/A;  . Carotid angiogram Bilateral 02/22/2012    Procedure: CAROTID ANGIOGRAM;  Surgeon: Nada Libman, MD;  Location: [facility] CATH LAB;  Service: Cardiovascular;  Laterality: Bilateral;  . Left heart catheterization with coronary/graft angiogram N/A 01/16/2014    Procedure: LEFT HEART CATHETERIZATION WITH Isabel Caprice;  Surgeon: Kathleene Hazel, MD;  Location: [facility] CATH LAB;  Service: Cardiovascular;  Laterality: N/A;  . Abdominal aortagram    . Colonoscopy    . Peripheral vascular catheterization N/A 06/24/2015    Procedure: Abdominal Aortogram;  Surgeon: Nada Libman, MD;  Location: [facility] INVASIVE CV LAB;  Service: Cardiovascular;  Laterality: N/A;  .  Endarterectomy femoral Right 06/25/2015    Procedure: ENDARTERECTOMY RIGHT FEMORAL ARTERY ;  Surgeon: Larina Earthly, MD;  Location: [facility] OR;  Service: Vascular;  Laterality: Right;  . Patch angioplasty Right 06/25/2015    Procedure: RIGHT COMMON FEMORAL ARTERY AND PROFUNDA PATCH ANGIOPLASTY USING HEMASHIELD PLATINUM FINESSE PATCH ;  Surgeon: Larina Earthly, MD;  Location: [facility] OR;  Service: Vascular;  Laterality: Right;   Social History:  reports that he has been smoking Cigarettes.  He has a 34 pack-year smoking history. He has never used smokeless tobacco. He reports that he does not drink alcohol or use illicit drugs.  Allergies  Allergen Reactions  . Pravachol Other (See Comments)    myalgias  . Simvastatin Other (See Comments)    myalgia  . Other Other (See Comments)    GREEN BEANS AND ONIONS  . Terazosin Other (See Comments)    unknown    Family History  Problem Relation Age of Onset  . Cancer Brother 60    oral   . Prostate cancer Brother   . Hypertension Brother   . Hyperlipidemia Brother   . Lung disease Father     black lung disease     Prior to Admission medications   Medication Sig Start Date End Date Taking? Authorizing Provider  acetaminophen (TYLENOL) 325 MG tablet Take 650 mg by mouth every 6 (six) hours as needed. ALSO 325 mg twice daily    Historical Provider, MD  amLODipine (NORVASC) 10 MG tablet Take 0.5 mg by mouth daily after supper.    Historical Provider, MD  aspirin 81 MG tablet Take 81 mg by mouth daily.    Historical Provider, MD  atorvastatin (LIPITOR) 40 MG tablet Take 1 tablet (40 mg total) by mouth daily at 6 PM. 05/14/15   Brittainy Sherlynn Carbon, PA-C  clopidogrel (PLAVIX) 75 MG tablet Take 75 mg by mouth at bedtime.     Historical Provider, MD  feeding supplement (BOOST HIGH PROTEIN) LIQD Take 1 Container by mouth 3 (three) times daily between meals.    Historical Provider, MD  feeding supplement, ENSURE COMPLETE, (ENSURE COMPLETE) LIQD Take 237 mLs by mouth 2  (two) times daily between meals. 01/05/14   Renae Fickle, MD  furosemide (LASIX) 20 MG tablet TAKE 1 TABLET (20 MG TOTAL) BY MOUTH DAILY. 06/16/15   Historical Provider, MD  isosorbide mononitrate (IMDUR) 30 MG 24 hr tablet Take 1 tablet (30 mg total) by mouth daily. 05/28/15   Leone Brand, NP  metoprolol (TOPROL-XL) 200 MG 24 hr tablet Take 100 mg by mouth daily.    Historical Provider, MD  nitroGLYCERIN (NITROSTAT) 0.4 MG SL tablet Place 1 tablet (0.4 mg total) under the tongue every 5 (five) minutes as needed. For chest pain 06/11/14   Hollice Espy, MD  sertraline (ZOLOFT)  100 MG tablet Take 50 mg by mouth at bedtime.    Historical Provider, MD  Tamsulosin HCl (FLOMAX) 0.4 MG CAPS Take 0.4 mg by mouth daily after supper.     Historical Provider, MD   Physical Exam: Filed Vitals:   07/03/15 0200  BP: 130/55  Pulse: 70  Temp:   Resp: 15    BP 130/55 mmHg  Pulse 70  Temp(Src) 98.2 F (36.8 C) (Oral)  Resp 15  Ht 5\' 9"  (1.753 m)  Wt 63.504 kg (140 lb)  BMI 20.67 kg/m2  SpO2 96%  General Appearance:    Alert, oriented, no distress, appears stated age  Head:    Normocephalic, atraumatic  Eyes:    PERRL, EOMI, sclera non-icteric        Nose:   Nares without drainage or epistaxis. Mucosa, turbinates normal  Throat:   Moist mucous membranes. Oropharynx without erythema or exudate.  Neck:   Supple. No carotid bruits.  No thyromegaly.  No lymphadenopathy.   Back:     No CVA tenderness, no spinal tenderness  Lungs:     Clear to auscultation bilaterally, without wheezes, rhonchi or rales  Chest wall:    No tenderness to palpitation  Heart:    Regular rate and rhythm without murmurs, gallops, rubs  Abdomen:     Soft, non-tender, nondistended, normal bowel sounds, no organomegaly  Genitalia:    deferred  Rectal:    deferred  Extremities:   No clubbing, cyanosis or edema.  Pulses:   2+ and symmetric all extremities  Skin:   Bandage over femoral artery site, some sanguinous  drainage  Lymph nodes:   Cervical, supraclavicular, and axillary nodes normal  Neurologic:   CNII-XII intact. Normal strength, sensation and reflexes      throughout    Labs on Admission:  Basic Metabolic Panel:  Recent Labs Lab 06/26/15 0315 07/02/15 2325  NA 143 141  K 3.6 3.7  CL 109 109  CO2 27 26  GLUCOSE 118* 104*  BUN 19 24*  CREATININE 1.81* 1.71*  CALCIUM 7.8* 8.1*   Liver Function Tests:  Recent Labs Lab 07/02/15 2325  AST 22  ALT 17  ALKPHOS 68  BILITOT 0.6  PROT 5.3*  ALBUMIN 2.5*   No results for input(s): LIPASE, AMYLASE in the last 168 hours. No results for input(s): AMMONIA in the last 168 hours. CBC:  Recent Labs Lab 06/26/15 0315 07/02/15 2325  WBC 10.3 7.7  NEUTROABS  --  4.3  HGB 8.9* 8.1*  HCT 26.6* 24.3*  MCV 96.4 98.4  PLT 110* 246   Cardiac Enzymes: No results for input(s): CKTOTAL, CKMB, CKMBINDEX, TROPONINI in the last 168 hours.  BNP (last 3 results)  Recent Labs  11/19/14 1257  PROBNP 4549.0*   CBG: No results for input(s): GLUCAP in the last 168 hours.  Radiological Exams on Admission: No results found.  EKG: Independently reviewed.  Assessment/Plan Active Problems:   Post-operative complication   1. Post op complication -  1. Bleeding from op site 2. Monitor overnight 3. CT of area obtained to determine size of hematoma if any 4. Repeat CBC in AM 5. Holding ASA / Plavix for now and SCDs only for DVT ppx  Dr. Hart Rochester spoke with EDP, he seemed unconcerned about the bleed.  Call him in AM if needed  Code Status: Full  Family Communication: No family in room Disposition Plan: Admit to obs   Time spent: 70 min  Tangia Pinard M. [facility] Pager [phone]  If 7AM-7PM, please contact the day team taking care of the patient [email or web address] Password [facility] 07/03/2015, 2:38 AM

## 2015-07-03 NOTE — Progress Notes (Signed)
I have seen and assessed the patient and agree with Dr Boston Service assessment and plan. Patient had presented with penile bleeding however states bleeding has since subsided and no further bleeding this morning. CT pelvis which was done on admission showed a hematoma surrounding the right, femoral artery, right superficial femoral artery and right profunda artery with maximum size of a little over 3 cm in cross-section. Presence of ongoing hemorrhage could not be determined. The right femoral-popliteal graft was within the hematoma. Enlarging aneurysms of distal abdominal aorta and both common iliac arteries. Patient noted to be anemic with a hemoglobin of 7.5 from 11.6 on 06/25/2015. Patient with some complaints of generalized fatigue. Will type and cross. Transfuse 2 units packed red blood cells. Vascular has been consulted and are following.

## 2015-07-03 NOTE — Progress Notes (Addendum)
Patient bed pad saturated with large about of blood.  Vital signs where taken and all within normal range.  See Flowsheet.  Patient has no lightheadedness, dizziness or disorientation.  MD notified by amion.

## 2015-07-03 NOTE — Consult Note (Signed)
Urology Consult  CC: Referring physician: Dr. Isla Pence Reason for referral: Hematuria  History of Present Illness: [name] is an 81 year old male patient of Dr. Sherron Monday seen in hospital consultation for gross hematuria. On 06/25/15 he underwent right iliac, common femoral and profundus endarterectomy and graft angioplasty. The night of surgery traumatically pulled his Foley catheter out. He was discharged to a nursing home and indicates that since being there has been experiencing blood from the end of his penis and with urination. He was discharged at the time of his discharge she was on Plavix and remains on that as well as aspirin at this time. He denies having any flank pain and told me that today he has having difficulty voiding but he was seen in the office yesterday by one of our extenders for symptoms of hematuria and difficulty voiding. He was found to be emptying his bladder adequately at that time. Due to blood from the penis nursing home he was sent back to the hospital and was noted to have a drop in his hemoglobin from 11.6 on 06/25/15 down to 7.5. He feels as if he is emptying his bladder adequately. His gross hematuria would be considered a moderate severity with no modifying factors or other associated signs and symptoms.   Past Medical History  Diagnosis Date  . Hypertension   . CAD (coronary artery disease)     a. s/p MI in 1988;  b. s/p CABG in 2000 (Dr. Barry Dienes) x 4, LIMA to LAD, SVG to diagonal, SVG to [facility], SVG to PDA;  c. 12/2013 NSTEMI/abnl MV;  d. 01/2014 Cath/PCI: native 3VD, VG->RCA 100, VG->OM aneurysmal 40ost/m, 29m(5.0x12 Veriflex BMS), VG->Diag aneurysmal, LIMA->LAD nl.  . TIA (transient ischemic attack)   . Hyperlipidemia   . COPD (chronic obstructive pulmonary disease)   . CVA (cerebral infarction) 07/2011    Remote left brain infarct with sensory deficits and had a left internal carotid stent placed by Dr. [name] at that time, no recurrent CVA or TIAs or CNS  events.  Marland Kitchen PAD (peripheral artery disease)     Stable. Had a past RFPBG by Dr. [name] in 2009. this graft is occluded with reconstitution in the popliteal artery, which was patent. Right tibial was occluded and 2-vessel runoff on angiography done by [name] in 12/2010.  . Sick sinus syndrome     PPM implanted for this and syncope   . Chronic systolic CHF (congestive heart failure)     Hospitalization for CHF from 05/04/13-05/06/13. Treated medically. Had possible pneumonia with short course of antibiotics.  . Abdominal bruit     Loud  . AAA (abdominal aortic aneurysm)     Has a known AAA of 3.2 x 3.4 cm by abdominal untrasound 03/27/12. Followup abdominal ultrasound on 03/29/13 showed a diameter of 3.6 cm, which is stable  . Peripheral vascular disease   . Hypothyroid     On supplement "don't know if I take RX or not for my thyroid" (01/16/2014)  . Stroke   . Syncope     tilt table test/8.10.01/orthostatic hypotension  . Orthostatic hypotension   . Presence of permanent cardiac pacemaker   . Pneumonia   . Headache    Past Surgical History  Procedure Laterality Date  . Femoral bypass Right ?2001  . Carotid stent Left 06/23/11    By [name]  . Coronary artery bypass graft  2000    1st in 2000 (Dr. Barry Dienes) x 4, LIMA to LAD, SVG to diagonal, SVG  to OM4, SVG to PDA. Re-cath in May 2012 with patent LIMA to LAD, patent SVG to diagonal, patent SVG to OM with left-to-right collaterals to an occluded right.  . Coronary angioplasty with stent placement  2001-01/16/2014    "think today makes #4" (01/16/2014)  . Cardiac catheterization  2000  . Inguinal hernia repair Right 1980's  . Insert / replace / remove pacemaker      implanted 2003 by Dr Jenne Campus with gen change (MDT [id number]) 08/2010 by [facility]  . Cataract extraction w/ intraocular lens  implant, bilateral Bilateral   . Abdominal aortagram N/A 02/22/2012    Procedure: ABDOMINAL AORTAGRAM;  Surgeon: Nada Libman, MD;  Location: [facility] C[facility] LAB;   Service: Cardiovascular;  Laterality: N/A;  . Carotid angiogram Bilateral 02/22/2012    Procedure: CAROTID ANGIOGRAM;  Surgeon: Nada Libman, MD;  Location: [facility] CATH LAB;  Service: Cardiovascular;  Laterality: Bilateral;  . Left heart catheterization with coronary/graft angiogram N/A 01/16/2014    Procedure: LEFT HEART CATHETERIZATION WITH Isabel Caprice;  Surgeon: Kathleene Hazel, MD;  Location: [facility] CATH LAB;  Service: Cardiovascular;  Laterality: N/A;  . Abdominal aortagram    . Colonoscopy    . Peripheral vascular catheterization N/A 06/24/2015    Procedure: Abdominal Aortogram;  Surgeon: Nada Libman, MD;  Location: [facility] INVASIVE CV LAB;  Service: Cardiovascular;  Laterality: N/A;  . Endarterectomy femoral Right 06/25/2015    Procedure: ENDARTERECTOMY RIGHT FEMORAL ARTERY ;  Surgeon: Larina Earthly, MD;  Location[facility] OR;  Service: Vascular;  Laterality: Right;  . Patch angioplasty Right 06/25/2015    Procedure: RIGHT COMMON FEMORAL ARTERY AND PROFUNDA PATCH ANGIOPLASTY USING HEMASHIELD PLATINUM FINESSE PATCH ;  Surgeon: Larina Earthly, MD;  Location: [facility] OR;  Service: Vascular;  Laterality: Right;    Medications:  Prior to Admission:  Prescriptions prior to admission  Medication Sig Dispense Refill Last Dose  . amLODipine (NORVASC) 5 MG tablet Take 5 mg by mouth daily.   07/02/2015 at Unknown time  . atorvastatin (LIPITOR) 40 MG tablet Take 1 tablet (40 mg total) by mouth daily at 6 PM. 30 tablet 5 07/02/2015 at Unknown time  . clopidogrel (PLAVIX) 75 MG tablet Take 75 mg by mouth at bedtime.    07/02/2015 at Unknown time  . finasteride (PROSCAR) 5 MG tablet Take 5 mg by mouth daily.   07/02/2015 at Unknown time  . furosemide (LASIX) 20 MG tablet TAKE 1 TABLET (20 MG TOTAL) BY MOUTH DAILY.  3 07/02/2015 at Unknown time  . isosorbide mononitrate (IMDUR) 30 MG 24 hr tablet Take 1 tablet (30 mg total) by mouth daily. 30 tablet 3 07/02/2015 at Unknown time  . metoprolol succinate  (TOPROL-XL) 100 MG 24 hr tablet Take 100 mg by mouth daily. Take with or immediately following a meal.   07/02/2015 at 0900  . oxyCODONE (OXY IR/ROXICODONE) 5 MG immediate release tablet Take 5 mg by mouth every 6 (six) hours as needed for severe pain.   06/28/2015  . sertraline (ZOLOFT) 100 MG tablet Take 50 mg by mouth at bedtime.   07/02/2015 at Unknown time  . Tamsulosin HCl (FLOMAX) 0.4 MG CAPS Take 0.4 mg by mouth daily after supper.    07/02/2015 at Unknown time  . traMADol (ULTRAM) 50 MG tablet Take 50 mg by mouth every 6 (six) hours as needed (FOR BREAKTHROUGH PAIN).   07/01/2015  . acetaminophen (TYLENOL) 325 MG tablet Take 650 mg by mouth every 6 (six) hours as needed.  ALSO 325 mg twice daily   07/01/2015  . feeding supplement (BOOST HIGH PROTEIN) LIQD Take 1 Container by mouth 3 (three) times daily between meals.   Taking  . feeding supplement, ENSURE COMPLETE, (ENSURE COMPLETE) LIQD Take 237 mLs by mouth 2 (two) times daily between meals. 60 Bottle 0 Taking  . nitroGLYCERIN (NITROSTAT) 0.4 MG SL tablet Place 1 tablet (0.4 mg total) under the tongue every 5 (five) minutes as needed. For chest pain 20 tablet 12 UNKNOWN    Allergies:  Allergies  Allergen Reactions  . Pravachol Other (See Comments)    myalgias  . Simvastatin Other (See Comments)    myalgia  . Other Other (See Comments)    GREEN BEANS AND ONIONS  . Terazosin Other (See Comments)    unknown    Family History  Problem Relation Age of Onset  . Cancer Brother 60    oral   . Prostate cancer Brother   . Hypertension Brother   . Hyperlipidemia Brother   . Lung disease Father     black lung disease    Social History:  reports that he quit smoking about 2 months ago. His smoking use included Cigarettes. He has a 34 pack-year smoking history. He has never used smokeless tobacco. He reports that he does not drink alcohol or use illicit drugs.  Review of Systems: Pertinent items are noted in HPI. A comprehensive review  of systems was negative except as noted above  Physical Exam:  Vital signs in last 24 hours: Temp:  [97.8 F (36.6 C)-98.7 F (37.1 C)] 98.7 F (37.1 C) (07/28 1717) Pulse Rate:  [69-83] 71 (07/28 1717) Resp:  [14-22] 18 (07/28 1717) BP: (85-142)/(6-92) 142/6 mmHg (07/28 1717) SpO2:  [95 %-100 %] 98 % (07/28 0717) Weight:  [63.4 kg (139 lb 12.4 oz)-63.504 kg (140 lb)] 63.4 kg (139 lb 12.4 oz) (07/28 0331) General appearance: alert and appears stated age Head: Normocephalic, without obvious abnormality, atraumatic Eyes: conjunctivae/corneas clear. EOM's intact.  Oropharynx: moist mucous membranes Neck: supple, symmetrical, trachea midline Resp: normal respiratory effort Cardio: regular rate and rhythm Back: symmetric, no curvature. ROM normal. No CVA tenderness. GI: soft, non-tender; bowel sounds normal; no masses,  no organomegaly  Male genitalia: penis: normal male phallus with no lesions or discharge. There was no blood noted at the meatus or in his bed. Testes: bilaterally descended with no masses or tenderness. no hernias  Extremities: extremities normal, atraumatic, no cyanosis or edema Skin: Skin color normal. No visible rashes or lesions Neurologic: Grossly normal  Laboratory Data:   Recent Labs  07/02/15 2325 07/03/15 0436  WBC 7.7 9.1  HGB 8.1* 7.5*  HCT 24.3* 23.1*   BMET  Recent Labs  07/02/15 2325  NA 141  K 3.7  CL 109  CO2 26  GLUCOSE 104*  BUN 24*  CREATININE 1.71*  CALCIUM 8.1*   No results for input(s): LABPT, INR in the last 72 hours. No results for input(s): LABURIN in the last 72 hours. Results for orders placed or performed during the hospital encounter of 06/24/15  Surgical pcr screen     Status: None   Collection Time: 06/24/15  1:01 PM  Result Value Ref Range Status   MRSA, PCR NEGATIVE NEGATIVE Final   Staphylococcus aureus NEGATIVE NEGATIVE Final    Comment:        The Xpert SA Assay (FDA approved for NASAL specimens in  patients over 89 years of age), is one component of a comprehensive  surveillance program.  Test performance has been validated by [facility] for patients greater than or equal to 11 year old. It is not intended to diagnose infection nor to guide or monitor treatment.    Creatinine:  Recent Labs  07/02/15 2325  CREATININE 1.71*    Imaging: Ct Pelvis Wo Contrast  07/03/2015   CLINICAL DATA:  Right femoral endarterectomy on 07/01/2015. Now with right thigh pain.  EXAM: CT PELVIS WITHOUT CONTRAST  TECHNIQUE: Multidetector CT imaging of the pelvis was performed following the standard protocol without intravenous contrast.  COMPARISON:  04/06/2012  FINDINGS: There is hematoma surrounding the right common femoral artery and extending down the native right superficial femoral artery and native right profunda artery. The maximal size of the hematoma is 3.2 x 3.4 cm in cross-section. The origin of the right femoral popliteal graft appears to lie within the hematoma. The top of the hematoma is at the level of the inguinal ligament and the hematoma extends over a craniocaudal length of approximately 13 cm. In the absence of intravenous contrast, determination cannot be made with regard to presence of ongoing hemorrhage.  There are aneurysms of the distal aorta and both common iliac arteries. The visible portion of the distal abdominal aortic aneurysm measures 3.9 cm in maximum AP diameter, enlarged from 04/06/2012 when it measured 3.0 cm. The aneurysm continues above the top of this study, and this measurement may not be the maximum dimension. 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 The right common iliac artery aneurysm measures 2.3 cm and previously measured 2.0 cm.  IMPRESSION: 1. Hematoma surrounding the right common femoral artery, right superficial femoral artery and right profunda artery with a maximum size of a little over 3 cm in cross-section.  Craniocaudal length is approximately 13 cm. Presence of ongoing hemorrhage cannot be determined on this unenhanced scan. The origin of the right femoral popliteal graft is within the hematoma. 2. Enlarging aneurysms of the distal abdominal aorta and both common iliac arteries.   Electronically Signed   By: Ellery Plunk M.D.   On: 07/03/2015 03:02   I independently reviewed his CT scan images. He does appear to have a small hematoma around the vessels where he underwent his surgery however his bladder appeared to be free of any clots. He did have some enlargement of the prostate noted.  I was preparing to insert a Foley catheter within the patient indicated to me that he needed to urinate. He urinated into a urinal in the urine was completely clear with a small amount of blood from the tip of the penis but there was no bleeding from the tip of the penis after urination.  Impression/Assessment:  His gross hematuria is clearly secondary to Foley trauma. Upper tract evaluation is not necessary. This is being worsened by the fact that he is on Plavix and aspirin. If these can be stopped this would help. Because he is voiding spontaneously I did not place a Foley catheter tonight. I did however discuss with his nurse the fact that if he continues to have significant bleeding from the urethra that I have left a 22 French coud tip catheter at the bedside which should be inserted and will result in tamponading of the bleeding.  He is currently being transfused 2 units of blood.  Plan:  1. Observation for now. 2. Check with vascular surgery and see if his Plavix and aspirin can be held safely. 3. Insert 22 [place]  coud tip catheter (left at bedside) if he continues to have significant bleeding overnight. 4. Serial hemoglobins.  5. Will follow and assist.   Amanee Iacovelli C 07/03/2015, 7:27 PM

## 2015-07-04 DIAGNOSIS — R319 Hematuria, unspecified: Secondary | ICD-10-CM | POA: Diagnosis present

## 2015-07-04 DIAGNOSIS — D62 Acute posthemorrhagic anemia: Secondary | ICD-10-CM | POA: Diagnosis not present

## 2015-07-04 DIAGNOSIS — Z9889 Other specified postprocedural states: Secondary | ICD-10-CM | POA: Diagnosis not present

## 2015-07-04 DIAGNOSIS — R55 Syncope and collapse: Secondary | ICD-10-CM | POA: Insufficient documentation

## 2015-07-04 DIAGNOSIS — D500 Iron deficiency anemia secondary to blood loss (chronic): Secondary | ICD-10-CM | POA: Diagnosis not present

## 2015-07-04 DIAGNOSIS — R310 Gross hematuria: Secondary | ICD-10-CM | POA: Diagnosis not present

## 2015-07-04 DIAGNOSIS — S3730XA Unspecified injury of urethra, initial encounter: Secondary | ICD-10-CM | POA: Diagnosis not present

## 2015-07-04 LAB — TYPE AND SCREEN
ABO/RH(D): O POS
ANTIBODY SCREEN: NEGATIVE
UNIT DIVISION: 0
UNIT DIVISION: 0

## 2015-07-04 LAB — CBC
HCT: 29 % — ABNORMAL LOW (ref 39.0–52.0)
Hemoglobin: 9.7 g/dL — ABNORMAL LOW (ref 13.0–17.0)
MCH: 31.1 pg (ref 26.0–34.0)
MCHC: 33.4 g/dL (ref 30.0–36.0)
MCV: 92.9 fL (ref 78.0–100.0)
PLATELETS: 230 10*3/uL (ref 150–400)
RBC: 3.12 MIL/uL — AB (ref 4.22–5.81)
RDW: 20 % — AB (ref 11.5–15.5)
WBC: 9.1 10*3/uL (ref 4.0–10.5)

## 2015-07-04 LAB — BASIC METABOLIC PANEL
Anion gap: 3 — ABNORMAL LOW (ref 5–15)
BUN: 19 mg/dL (ref 6–20)
CHLORIDE: 110 mmol/L (ref 101–111)
CO2: 29 mmol/L (ref 22–32)
CREATININE: 1.47 mg/dL — AB (ref 0.61–1.24)
Calcium: 7.9 mg/dL — ABNORMAL LOW (ref 8.9–10.3)
GFR calc Af Amer: 48 mL/min — ABNORMAL LOW (ref >60)
GFR calc non Af Amer: 41 mL/min — ABNORMAL LOW (ref >60)
Glucose, Bld: 85 mg/dL (ref 65–99)
Potassium: 4 mmol/L (ref 3.5–5.1)
Sodium: 142 mmol/L (ref 135–145)

## 2015-07-04 MED ORDER — CLOPIDOGREL BISULFATE 75 MG PO TABS: 75.0000 mg | ORAL_TABLET | Freq: Every day | ORAL | Status: DC

## 2015-07-04 MED ORDER — OXYCODONE HCL 5 MG PO TABS: 5.0000 mg | ORAL_TABLET | Freq: Four times a day (QID) | ORAL | Status: DC | PRN

## 2015-07-04 MED ORDER — TRAMADOL HCL 50 MG PO TABS: 50.0000 mg | ORAL_TABLET | Freq: Four times a day (QID) | ORAL | Status: DC | PRN

## 2015-07-04 NOTE — Evaluation (Signed)
Occupational Therapy Evaluation Patient Details Name: [name] MRN: [id number] DOB: 20-Dec-1926 Today's Date: 07/04/2015    History of Present Illness Pt admit with Right external iliac, common femoral and profundus endarterectomy with Dacron patch angioplasty.  D/C'd to [facility] now admitted with hematuria.     Clinical Impression   Pt admitted with above.  Pt presents to OT with decreased safety/judgement, impaired balance, and generalized weakness.  Pt had syncopal episode with OT - he and daughter confirm that this happens frequently and he falls frequently at home - see comments below for details.  Optimally feel pt needs 24 hour assist at discharge and needs to use his rollator at all times to allow him to sit if he experiences dizziness.  He is resistant to all suggestions/recommendations.   Daughter is not comfortable with pt discharging home, but pt is refusing SNF at this time.  IF he discharges home recommend HHOT, HHPT, HHSW, and HHRN (unsure if pt able to manage meds independently).      Follow Up Recommendations  SNF;Supervision/Assistance - 24 hour;Home health OT    Equipment Recommendations  None recommended by OT    Recommendations for Other Services       Precautions / Restrictions Precautions Precautions: Fall Restrictions Weight Bearing Restrictions: No      Mobility Bed Mobility Overal bed mobility: Modified Independent             General bed mobility comments: with HOB flat and without rails  Transfers Overall transfer level: Needs assistance Equipment used: 1 person hand held assist Transfers: Sit to/from Stand;Stand Pivot Transfers Sit to Stand: Min assist;Min guard Stand pivot transfers: Min guard;Min assist       General transfer comment: see comments in ADL section     Balance Overall balance assessment: History of Falls Sitting-balance support: Feet supported Sitting balance-Leahy Scale: Good     Standing balance support:  During functional activity Standing balance-Leahy Scale: Poor Standing balance comment: Pt unable to maintain standing without UE support                             ADL Overall ADL's : Needs assistance/impaired Eating/Feeding: Independent   Grooming: Wash/dry hands;Wash/dry face;Oral care;Brushing hair;Set up;Sitting   Upper Body Bathing: Set up;Sitting   Lower Body Bathing: Minimal assistance;Sit to/from stand   Upper Body Dressing : Set up;Sitting   Lower Body Dressing: Minimal assistance;Sit to/from stand   Toilet Transfer: Minimal assistance;Stand-pivot;BSC           Functional mobility during ADLs: Minimal assistance General ADL Comments: Pt eager to discharge home.  He is insistent that he has assist at home.  He reports he has a h/o syncopal episodes that started in 1952, and reports his last fall was 06/24/15.  Pt with dizziness with sit to stand.  Pt returned to sitting. BP seated 138/68, upon standing BP 101/58 with c/o dizziness and need to sit.   Pt attempted to stand after resting ~5 mins and stated he felt good and could walk toward door.  Pt then leaned into the window, began shaking, and became unresponsive.  Pt assisted back to recliner with max A to prevent fall.  Pt unresponsive for ~ 30 seconds with a period of disorientation that lasted ~ 15 seconds.   BP was taken after moving pt to safety and was  128/57.  As pt regained consciousness, he confirmed this is what he does at home.  Long discussion with pt re: need for 24 hour assist, and the need to use rollator AT ALL TIMES, but pt refuses stating "it gets in my way", and "what do you want me to do, give up?".  Discussed risks of falls and injury with pt, but he continues to insist he is fine, that this is normal for him, and refuses to consider other options.  Daughter arrived as OT was exiting and states, that syncopal episodes do happen frequently, and that pt sustains frequent falls as a result.  She  states that pt does not have 24 hour assist, and that he will be home alone at least 8 hours/day.  RN notified during syncopal episode, and was present during end of eval and for safety discussion with pt.   MD was notified of above as was SW and CM.        Vision     Perception     Praxis      Pertinent Vitals/Pain Pain Assessment: No/denies pain Pain Score: 3  Pain Location: R thigh Pain Descriptors / Indicators: Aching Pain Intervention(s): Limited activity within patient's tolerance;Premedicated before session;Repositioned     Hand Dominance Right   Extremity/Trunk Assessment Upper Extremity Assessment Upper Extremity Assessment: RUE deficits/detail;LUE deficits/detail RUE Deficits / Details: bil. UE with mild tremor  LUE Deficits / Details: bil. UE with mild tremor   Lower Extremity Assessment Lower Extremity Assessment: Generalized weakness RLE: Unable to fully assess due to pain   Cervical / Trunk Assessment Cervical / Trunk Assessment: Kyphotic   Communication Communication Communication: No difficulties   Cognition Arousal/Alertness: Awake/alert Behavior During Therapy: WFL for tasks assessed/performed Overall Cognitive Status: Within Functional Limits for tasks assessed (Pt with decreased judgement )                     General Comments       Exercises       Shoulder Instructions      Home Living Family/patient expects to be discharged to:: Private residence Living Arrangements: Children Available Help at Discharge: Family;Available 24 hours/day Type of Home: House Home Access: Stairs to enter [employer] of Steps: 2 Entrance Stairs-Rails: Left Home Layout: One level     Bathroom Shower/Tub: Chief Strategy Officer: Handicapped height Bathroom Accessibility: Yes   Home Equipment: Environmental consultant - 2 wheels;Wheelchair - manual;Bedside commode;Grab bars - tub/shower;Shower seat          Prior Functioning/Environment  Level of Independence: Independent        Comments: 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  pt lost his wife  ~2 yrs ago.      OT Diagnosis: Generalized weakness   OT Problem List: Decreased activity tolerance;Impaired balance (sitting and/or standing);Decreased safety awareness   OT Treatment/Interventions:      OT Goals(Current goals can be found in the care plan section) Acute Rehab OT Goals Patient Stated Goal: to return home  OT Frequency:     Barriers to D/C: Decreased caregiver support          Co-evaluation              End of Session Nurse Communication: Mobility status  Activity Tolerance: Other (comment) (orthostasis) Patient left: in chair;with call bell/phone within reach;with nursing/sitter in room;with family/visitor present   Time: 1212-1305 OT Time Calculation (min): 53 min Charges:  OT General Charges $OT Visit: 1 Procedure OT Evaluation $Initial OT Evaluation Tier I:  1 Procedure OT Treatments $Self Care/Home Management : 23-37 mins $Therapeutic Activity: 8-22 mins G-Codes: OT G-codes **NOT FOR INPATIENT CLASS** Functional Limitation: Self care Self Care Current Status (W1027): At least 1 percent but less than 20 percent impaired, limited or restricted Self Care Goal Status (O5366): At least 1 percent but less than 20 percent impaired, limited or restricted Self Care Discharge Status [phone]): At least 1 percent but less than 20 percent impaired, limited or restricted  Karmyn Lowman M 07/04/2015, 1:43 PM

## 2015-07-04 NOTE — Progress Notes (Signed)
   VASCULAR SURGERY ASSESSMENT & PLAN:  * His right groin wound looks fine with only a small amount of serous drainage from the superior aspect of the incision. I have written for dry dressing changes to the right groin and he can do this himself if he is discharged.   * He will keep his regularly scheduled follow up appointment with Dr. [name].   * No further hematuria. He does not need to be on Plavix from a vascular standpoint however, I'm not sure if his primary medical doctor has him on this for other reasons.  SUBJECTIVE: No specific complaints except that he once to sleep.  PHYSICAL EXAM: Filed Vitals:   07/03/15 1658 07/03/15 1717 07/03/15 2032 07/04/15 0421  BP: 114/50 142/6 123/58 132/56  Pulse: 72 71 75 76  Temp: 98.5 F (36.9 C) 98.7 F (37.1 C) 98.2 F (36.8 C) 98.5 F (36.9 C)  TempSrc: Oral Oral Oral Oral  Resp: Height:      Weight:      SpO2:   96% 100%   His right groin incision looks fine with minimal serous drainage from the superior aspect of his incision.  LABS: Lab Results  Component Value Date   WBC 9.1 07/04/2015   HGB 9.7* 07/04/2015   HCT 29.0* 07/04/2015   MCV 92.9 07/04/2015   PLT 230 07/04/2015   Lab Results  Component Value Date   CREATININE 1.47* 07/04/2015   Lab Results  Component Value Date   INR 1.18 06/24/2015   CBG (last 3)  No results for input(s): GLUCAP in the last 72 hours.  Principal Problem:   Post-operative complication Active Problems:   Postoperative anemia due to acute blood loss   Anemia due to blood loss   Gross hematuria   H/O endarterectomy    Cari Caraway Beeper: [phone] 07/04/2015

## 2015-07-04 NOTE — Care Management Note (Addendum)
Case Management Note  Patient Details  Name: [name] MRN: [id number] Date of Birth: 1927-01-02  Subjective/Objective:    Pt admitted with Gross Hematuria           Action/Plan:  Pt is from [facility], however pt wants to return home with his family.  Per pt: Pt states that he will have grandson and granddaughter available during the day and then his daughter would be available at night, his daughter and son in law live in the home.  CSW consulted, PT ordered by MD.  CM will monitor for disposition needs   Expected Discharge Date:                  Expected Discharge Plan:  Home w Home Health Services  In-House Referral:  Clinical Social Work  Discharge planning Services  CM Consult  Post Acute Care Choice:    Choice offered to:  Patient  DME Arranged:    DME Agency:     HH Arranged: PT,OT,SW, RN, Aide HH Agency:  [facility]  Status of Service:  Complete, will sign off  Medicare Important Message Given:    Date Medicare IM Given:    Medicare IM give by:    Date Additional Medicare IM Given:    Additional Medicare Important Message give by:     If discussed at Long Length of Stay Meetings, dates discussed:    Additional Comments: At MD request; CM provided to pt and Daughter Private Care Duty list, explained that this service would be covered by insurance.  CM explained this service is recommended during the periods when pt will be alone at home.  List was reluctantly accepted, pt stated "all I need is to get home".  CM contacted [facility] agency and added HHRN and Aide per MD verbal request.  Pt will discharge home with home health services.  During OT evaluation pt had syncopal episode, MD made aware.  Daughter at bedside; voiced concerns about pt returning home due to safety concerns.  CM and CSW went to bedside and spoke with both pt and daughter [name].  Pt adamatly refuses going back to SNF regardless of the safety concerns communicated.  Daughter confirmed  pt has the following equipment; cane, walker, lift chair, shower chair, shower bar and 3:1.  CM communicated with daughter [facility] ordered.  MD aware of syncopal episode and pt wishes to return home, disposition plan remains home with home health. No other CM needs.  CM assessed pt; pt stated he wanted to go home instead of going back to SNF.  MD is aware of pts wishes; ordered PT eval prior to discharge, consulted with CSW and ordered [facility].    CM offered pt choice, he chose [facility], CM contacted agency, referral accepted.  Address and phone number verified against Epic.  Pt stated he has all the equipment that he needed; pt did not elaborate on specific equipment types.   [name], RN 07/04/2015, 2:00 PM

## 2015-07-04 NOTE — Progress Notes (Signed)
Pt voided 100cc of clear- yellow urine. Pt does not appear to have any issues voiding at this time. RN will cont to monitor for bleeding.

## 2015-07-04 NOTE — Discharge Summary (Signed)
Physician Discharge Summary  [name] RUE:[id number] DOB: 23-Jan-1927 DOA: 07/02/2015  PCP: Delorse Lek, MD  Admit date: 07/02/2015 Discharge date: 07/04/2015  Time spent: 65 minutes  Recommendations for Outpatient Follow-up:  1. Follow-up with Urology as previously scheduled. 2. Follow-up with Dr. [name] of vascular surgery as previously scheduled. 3. Follow-up with BURNETT,BRENT A, MD in 1 week. On follow-up patient need a CBC done to follow-up on his hemoglobin. Patient wanted a basic metabolic profile done to follow-up on electrolytes and renal function. 4.   Discharge Diagnoses:  Principal Problem:   Gross hematuria Active Problems:   Postoperative anemia due to acute blood loss   Post-operative complication   Anemia due to blood loss   H/O endarterectomy   Hematuria   Discharge Condition: Stable and improved.  Diet recommendation: Heart healthy diet  Filed Weights   07/02/15 2313 07/03/15 0331  Weight: 63.504 kg (140 lb) 63.4 kg (139 lb 12.4 oz)    History of present illness:  Per Dr. Berenice Primas Naclerio is a 81 y.o. male who is s/p R femoral endarterectomy done on 7/20. Patient was sent in to ED after he developed bleeding from his op site at tip of penis on the day of admission at his NH. He has leg pain that radiated down to the knee. Does have dopplerable DP pulse in that foot.   Hospital Course:  #1 gross hematuria Patient was admitted with gross hematuria with concerns for bleeding from operative site as patient had had a recent right femoral endarterectomy done 06/25/2015 per vascular surgery. CT of the pelvis which was done showed a hematoma surrounding the right, femoral artery, right superficial femoral artery and right profundal artery with a maximum size of a little over 3 cm in cross-section. Enlarging aneurysms of the distal abdominal your and both, iliac arteries noted. Patient was also noted to be anemic with a hemoglobin of 7.5 on admission and  was 11.6 on 06/25/2015. Patient was seen by vascular surgery who reviewed pelvic CT and assessed the patient and felt wound site looked fine with only a small amount of serous drainage and felt hematuria was more likely urological in nature. Urology was consulted and patient was seen in consultation by Dr. Vernie Ammons who noted that the night of patient's surgery 06/25/2015 patient traumatically pulled his Foley catheter out. It was felt patient's gross hematuria was secondary to Foley trauma in the setting of Plavix. Patient was monitored. Patient was able to urinate. Patient's gross hematuria improved and had resolved by day of discharge. Patient will follow-up with urology as previously scheduled. Patient's Plavix was held and will be resumed one week post discharge.  #2 postop anemia due to acute blood loss anemia On admission patient was noted to have a hemoglobin of 7.5 which was 11.6 on 06/25/2015. It was felt patient's anemia was likely postoperative anemia due to recent surgery in addition to gross hematuria. Anemia panel obtained was consistent with anemia of chronic disease. Patient was transfused 2 units packed red blood cells with appropriate response with hemoglobin stabilized at 9.7. Patient will follow-up with PCP as outpatient.  #3 status post endarterectomy of right external iliac artery, common femoral artery, and deep femoral artery 06/25/2015 On admission there was concern for postop complication due to recent surgery and as such vascular surgery was consulted. It was felt patient did not have any evidence of bleeding from the groin site. CT films of the pelvis were reviewed by vascular surgery and felt hematoma noted was  not especially concerning an unlikely etiology of patient's hematuria. Patient was followed. Patient will follow-up with vascular surgery as outpatient.  #4 coronary artery disease Remained stable throughout the hospitalization. Patient was maintained on home regimen of  Lipitor, Imdur, Toprol, Norvasc. Outpatient follow-up.  The rest of patient's chronic medical issues remain stable throughout the hospitalization and patient was discharged in stable and improved condition.   Procedures:  CT pelvis 07/03/2015    Consultations:  Vascular surgery: Dr Edilia Bo 07/03/15  Urology : Dr Vernie Ammons 07/03/15  Discharge Exam: Filed Vitals:   07/04/15 0421  BP: 132/56  Pulse: 76  Temp: 98.5 F (36.9 C)  Resp: 18    General: NAD Cardiovascular: RRR Respiratory: CTAB  Discharge Instructions   Discharge Instructions    Diet - low sodium heart healthy    Complete by:  As directed      Discharge instructions    Complete by:  As directed   Follow up with vascular surgery as scheduled. Follow up with urology as scheduled. Follow up with BURNETT,BRENT A, MD in 1 week. Dry 4 x 4 to right groin 2 times daily.     Increase activity slowly    Complete by:  As directed           Current Discharge Medication List    CONTINUE these medications which have CHANGED   Details  clopidogrel (PLAVIX) 75 MG tablet Take 1 tablet (75 mg total) by mouth at bedtime. Resume in 1 week.    oxyCODONE (OXY IR/ROXICODONE) 5 MG immediate release tablet Take 1 tablet (5 mg total) by mouth every 6 (six) hours as needed for severe pain. Qty: 15 tablet, Refills: 0    traMADol (ULTRAM) 50 MG tablet Take 1 tablet (50 mg total) by mouth every 6 (six) hours as needed (FOR BREAKTHROUGH PAIN). Qty: 20 tablet, Refills: 0      CONTINUE these medications which have NOT CHANGED   Details  amLODipine (NORVASC) 5 MG tablet Take 5 mg by mouth daily.    atorvastatin (LIPITOR) 40 MG tablet Take 1 tablet (40 mg total) by mouth daily at 6 PM. Qty: 30 tablet, Refills: 5    finasteride (PROSCAR) 5 MG tablet Take 5 mg by mouth daily.    furosemide (LASIX) 20 MG tablet TAKE 1 TABLET (20 MG TOTAL) BY MOUTH DAILY. Refills: 3    isosorbide mononitrate (IMDUR) 30 MG 24 hr tablet Take 1  tablet (30 mg total) by mouth daily. Qty: 30 tablet, Refills: 3    metoprolol succinate (TOPROL-XL) 100 MG 24 hr tablet Take 100 mg by mouth daily. Take with or immediately following a meal.    sertraline (ZOLOFT) 100 MG tablet Take 50 mg by mouth at bedtime.    Tamsulosin HCl (FLOMAX) 0.4 MG CAPS Take 0.4 mg by mouth daily after supper.     acetaminophen (TYLENOL) 325 MG tablet Take 650 mg by mouth every 6 (six) hours as needed. ALSO 325 mg twice daily    !! feeding supplement (BOOST HIGH PROTEIN) LIQD Take 1 Container by mouth 3 (three) times daily between meals.    !! feeding supplement, ENSURE COMPLETE, (ENSURE COMPLETE) LIQD Take 237 mLs by mouth 2 (two) times daily between meals. Qty: 60 Bottle, Refills: 0    nitroGLYCERIN (NITROSTAT) 0.4 MG SL tablet Place 1 tablet (0.4 mg total) under the tongue every 5 (five) minutes as needed. For chest pain Qty: 20 tablet, Refills: 12     !! - Potential duplicate medications  found. Please discuss with provider.     Allergies  Allergen Reactions  . Pravachol Other (See Comments)    myalgias  . Simvastatin Other (See Comments)    myalgia  . Other Other (See Comments)    GREEN BEANS AND ONIONS  . Terazosin Other (See Comments)    unknown   Follow-up Information    Follow up with BURNETT,BRENT A, MD. Schedule an appointment as soon as possible for a visit in 1 week.   Specialty:  Family Medicine   Contact information:   [address] [place] [address] [phone]       Follow up with Gretta Began, MD.   Specialties:  Vascular Surgery, Cardiology   Why:  Follow-up as scheduled.   Contact information:   [address] [place] [address] [phone]        The results of significant diagnostics from this hospitalization (including imaging, microbiology, ancillary and laboratory) are listed below for reference.    Significant Diagnostic Studies: Ct Pelvis Wo Contrast  07/03/2015   CLINICAL DATA:  Right  femoral endarterectomy on 07/01/2015. Now with right thigh pain.  EXAM: CT PELVIS WITHOUT CONTRAST  TECHNIQUE: Multidetector CT imaging of the pelvis was performed following the standard protocol without intravenous contrast.  COMPARISON:  04/06/2012  FINDINGS: There is hematoma surrounding the right common femoral artery and extending down the native right superficial femoral artery and native right profunda artery. The maximal size of the hematoma is 3.2 x 3.4 cm in cross-section. The origin of the right femoral popliteal graft appears to lie within the hematoma. The top of the hematoma is at the level of the inguinal ligament and the hematoma extends over a craniocaudal length of approximately 13 cm. In the absence of intravenous contrast, determination cannot be made with regard to presence of ongoing hemorrhage.  There are aneurysms of the distal aorta and both common iliac arteries. The visible portion of the distal abdominal aortic aneurysm measures 3.9 cm in maximum AP diameter, enlarged from 04/06/2012 when it measured 3.0 cm. The aneurysm continues above the top of this study, and this measurement may not be the maximum dimension. 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 The right common iliac artery aneurysm measures 2.3 cm and previously measured 2.0 cm.  IMPRESSION: 1. Hematoma surrounding the right common femoral artery, right superficial femoral artery and right profunda artery with a maximum size of a little over 3 cm in cross-section. Craniocaudal length is approximately 13 cm. Presence of ongoing hemorrhage cannot be determined on this unenhanced scan. The origin of the right femoral popliteal graft is within the hematoma. 2. Enlarging aneurysms of the distal abdominal aorta and both common iliac arteries.   Electronically Signed   By: Ellery Plunk M.D.   On: 07/03/2015 03:02    Microbiology: Recent Results (from the past 240 hour(s))  Surgical  pcr screen     Status: None   Collection Time: 06/24/15  1:01 PM  Result Value Ref Range Status   MRSA, PCR NEGATIVE NEGATIVE Final   Staphylococcus aureus NEGATIVE NEGATIVE Final    Comment:        The Xpert SA Assay (FDA approved for NASAL specimens in patients over 66 years of age), is one component of a comprehensive surveillance program.  Test performance has been validated by [facility] for patients greater than or equal to 41 year old. It is not intended to diagnose  infection nor to guide or monitor treatment.      Labs: Basic Metabolic Panel:  Recent Labs Lab 07/02/15 2325 07/04/15 0530  NA 141 142  K 3.7 4.0  CL 109 110  CO2 26 29  GLUCOSE 104* 85  BUN 24* 19  CREATININE 1.71* 1.47*  CALCIUM 8.1* 7.9*   Liver Function Tests:  Recent Labs Lab 07/02/15 2325  AST 22  ALT 17  ALKPHOS 68  BILITOT 0.6  PROT 5.3*  ALBUMIN 2.5*   No results for input(s): LIPASE, AMYLASE in the last 168 hours. No results for input(s): AMMONIA in the last 168 hours. CBC:  Recent Labs Lab 07/02/15 2325 07/03/15 0436 07/03/15 2245 07/04/15 0530  WBC 7.7 9.1  --  9.1  NEUTROABS 4.3  --   --   --   HGB 8.1* 7.5* 9.5* 9.7*  HCT 24.3* 23.1* 27.7* 29.0*  MCV 98.4 98.7  --  92.9  PLT 246 227  --  230   Cardiac Enzymes: No results for input(s): CKTOTAL, CKMB, CKMBINDEX, TROPONINI in the last 168 hours. BNP: BNP (last 3 results) No results for input(s): BNP in the last 8760 hours.  ProBNP (last 3 results)  Recent Labs  11/19/14 1257  PROBNP 4549.0*    CBG: No results for input(s): GLUCAP in the last 168 hours.     SignedRamiro Harvest MD [facility] 07/04/2015, 10:26 AM

## 2015-07-04 NOTE — Evaluation (Signed)
Physical Therapy Evaluation Patient Details Name: [name] MRN: [id number] DOB: 03/04/1927 Today's Date: 07/04/2015   History of Present Illness  Pt admit with Right external iliac, common femoral and profundus endarterectomy with Dacron patch angioplasty.  D/C'd to [facility] now admitted with hematuria.    Clinical Impression  Pt overall S level, however had two instances of LOB esp when distracted or given task of head turns.  Highly recommended to pt that he use RW or cane at home due to instability, pt verbalized understanding, however feel that he will likely not to at home.  Pt seems very set in his ways and had excuse for every deficit pointed out by therapist.  In addition to AD, recommend 24/7 S for all mobility for safety and HHPT at D/C.     Follow Up Recommendations Home health PT;Supervision for mobility/OOB    Equipment Recommendations  None recommended by PT    Recommendations for Other Services       Precautions / Restrictions Precautions Precautions: Fall Restrictions Weight Bearing Restrictions: No      Mobility  Bed Mobility Overal bed mobility: Modified Independent             General bed mobility comments: with HOB flat and without rails  Transfers Overall transfer level: Needs assistance Equipment used: None Transfers: Sit to/from Stand Sit to Stand: Supervision         General transfer comment: S for safety.  Continues to have pain in R quad that "catches" and causes pt to grab in pain.   Ambulation/Gait Ambulation/Gait assistance: Min assist Ambulation Distance (Feet): 200 Feet Assistive device: None Gait Pattern/deviations: Step-through pattern;Decreased stance time - right;Antalgic;Staggering right;Staggering left     General Gait Details: Pt with mild unsteadiness and two instances of LOB in hallway, esp with distractions.  Pt tends to talk over deficits and has a reason for every deficits pointed out by thearpist.  Highly  recommend that pt use walker or cane at home due to unsteadiness and pain in RLE.  Pt verablized understanding, however feel that he will not do this at home.   Stairs Stairs: Yes Stairs assistance: Supervision Stair Management: One rail Right;Step to pattern;Forwards Number of Stairs: 4    Wheelchair Mobility    Modified Rankin (Stroke Patients Only)       Balance Overall balance assessment: History of Falls Sitting-balance support: Feet supported Sitting balance-Leahy Scale: Good     Standing balance support: During functional activity;No upper extremity supported Standing balance-Leahy Scale: Fair Standing balance comment: Pt with LOB with head turns and distractions in hallway.                              Pertinent Vitals/Pain Pain Assessment: 0-10 Pain Score: 3  Pain Location: R thigh Pain Descriptors / Indicators: Aching Pain Intervention(s): Limited activity within patient's tolerance;Premedicated before session;Repositioned    Home Living Family/patient expects to be discharged to:: Private residence Living Arrangements: Children Available Help at Discharge: Family;Available 24 hours/day Type of Home: House Home Access: Stairs to enter Entrance Stairs-Rails: Left Entrance Stairs-Number of Steps: 2 Home Layout: One level Home Equipment: Walker - 2 wheels;Wheelchair - manual;Bedside commode;Grab bars - tub/shower;Shower seat      Prior Function Level of Independence: Independent         Comments: 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  pt lost his wife  ~2  yrs ago.   (prior to sx on 7/20)     Hand Dominance   Dominant Hand: Right    Extremity/Trunk Assessment               Lower Extremity Assessment: Generalized weakness      Cervical / Trunk Assessment: Kyphotic  Communication   Communication: No difficulties  Cognition Arousal/Alertness: Awake/alert Behavior During Therapy: WFL for tasks  assessed/performed Overall Cognitive Status: Within Functional Limits for tasks assessed                      General Comments      Exercises        Assessment/Plan    PT Assessment All further PT needs can be met in the next venue of care  PT Diagnosis Difficulty walking;Generalized weakness;Acute pain   PT Problem List Decreased strength;Decreased activity tolerance;Decreased balance;Decreased mobility;Decreased safety awareness;Decreased knowledge of precautions;Pain  PT Treatment Interventions     PT Goals (Current goals can be found in the Care Plan section) Acute Rehab PT Goals Patient Stated Goal: to return home PT Goal Formulation: All assessment and education complete, DC therapy    Frequency     Barriers to discharge        Co-evaluation               End of Session   Activity Tolerance: Patient limited by pain Patient left: in chair;with call bell/phone within reach Nurse Communication: Mobility status    Functional Assessment Tool Used: clinical judgement Functional Limitation: Mobility: Walking and moving around Mobility: Walking and Moving Around Current Status ([id number]): At least 1 percent but less than 20 percent impaired, limited or restricted Mobility: Walking and Moving Around Goal Status [phone]): At least 1 percent but less than 20 percent impaired, limited or restricted Mobility: Walking and Moving Around Discharge Status [phone]): At least 1 percent but less than 20 percent impaired, limited or restricted    Time: 1130-1155 PT Time Calculation (min) (ACUTE ONLY): 25 min   Charges:   PT Evaluation $Initial PT Evaluation Tier I: 1 Procedure PT Treatments $Gait Training: 8-22 mins   PT G Codes:   PT G-Codes **NOT FOR INPATIENT CLASS** Functional Assessment Tool Used: clinical judgement Functional Limitation: Mobility: Walking and moving around Mobility: Walking and Moving Around Current Status (F5732): At least 1 percent but less  than 20 percent impaired, limited or restricted Mobility: Walking and Moving Around Goal Status [phone]): At least 1 percent but less than 20 percent impaired, limited or restricted Mobility: Walking and Moving Around Discharge Status [phone]): At least 1 percent but less than 20 percent impaired, limited or restricted    Denice Bors 07/04/2015, 12:44 PM

## 2015-07-04 NOTE — Progress Notes (Signed)
Subjective: The patient reports that he is no longer experiencing any bleeding from the urethra. He reports no difficulty with urination.   Objective: Vital signs in last 24 hours: Temp:  [98.2 F (36.8 C)-98.7 F (37.1 C)] 98.5 F (36.9 C) (07/29 0421) Pulse Rate:  [70-76] 76 (07/29 0421) Resp:  [16-18] 18 (07/29 0421) BP: (114-142)/(6-71) 132/56 mmHg (07/29 0421) SpO2:  [96 %-100 %] 100 % (07/29 0421)A  Intake/Output from previous day: 07/28 0701 - 07/29 0700 In: 1053 [P.O.:360; I.V.:23; Blood:670] Out: 1850 [Urine:1850] Intake/Output this shift:    Past Medical History  Diagnosis Date  . Hypertension   . CAD (coronary artery disease)     a. s/p MI in 1988;  b. s/p CABG in 2000 (Dr. Barry Dienes) x 4, LIMA to LAD, SVG to diagonal, SVG to [facility], SVG to PDA;  c. 12/2013 NSTEMI/abnl MV;  d. 01/2014 Cath/PCI: native 3VD, VG->RCA 100, VG->OM aneurysmal 40ost/m, 46m(5.0x12 Veriflex BMS), VG->Diag aneurysmal, LIMA->LAD nl.  . TIA (transient ischemic attack)   . Hyperlipidemia   . COPD (chronic obstructive pulmonary disease)   . CVA (cerebral infarction) 07/2011    Remote left brain infarct with sensory deficits and had a left internal carotid stent placed by Dr. [name] at that time, no recurrent CVA or TIAs or CNS events.  Marland Kitchen PAD (peripheral artery disease)     Stable. Had a past RFPBG by Dr. [name] in 2009. this graft is occluded with reconstitution in the popliteal artery, which was patent. Right tibial was occluded and 2-vessel runoff on angiography done by [name] in 12/2010.  . Sick sinus syndrome     PPM implanted for this and syncope   . Chronic systolic CHF (congestive heart failure)     Hospitalization for CHF from 05/04/13-05/06/13. Treated medically. Had possible pneumonia with short course of antibiotics.  . Abdominal bruit     Loud  . AAA (abdominal aortic aneurysm)     Has a known AAA of 3.2 x 3.4 cm by abdominal untrasound 03/27/12. Followup abdominal ultrasound on 03/29/13  showed a diameter of 3.6 cm, which is stable  . Peripheral vascular disease   . Hypothyroid     On supplement "don't know if I take RX or not for my thyroid" (01/16/2014)  . Stroke   . Syncope     tilt table test/8.10.01/orthostatic hypotension  . Orthostatic hypotension   . Presence of permanent cardiac pacemaker   . Pneumonia   . Headache     Physical Exam:   abdomen is soft and nontender.  GU : phallus is normal with no was at the meatus or active bleeding noted.  Lab Results:  Recent Labs  07/02/15 2325 07/03/15 0436 07/03/15 2245 07/04/15 0530  WBC 7.7 9.1  --  9.1  HGB 8.1* 7.5* 9.5* 9.7*  HCT 24.3* 23.1* 27.7* 29.0*   BMET  Recent Labs  07/02/15 2325 07/04/15 0530  NA 141 142  K 3.7 4.0  CL 109 110  CO2 26 29  GLUCOSE 104* 85  BUN 24* 19  CREATININE 1.71* 1.47*  CALCIUM 8.1* 7.9*   No results for input(s): LABURIN in the last 72 hours. Results for orders placed or performed during the hospital encounter of 06/24/15  Surgical pcr screen     Status: None   Collection Time: 06/24/15  1:01 PM  Result Value Ref Range Status   MRSA, PCR NEGATIVE NEGATIVE Final   Staphylococcus aureus NEGATIVE NEGATIVE Final    Comment:  The Xpert SA Assay (FDA approved for NASAL specimens in patients over 36 years of age), is one component of a comprehensive surveillance program.  Test performance has been validated by [facility] for patients greater than or equal to 77 year old. It is not intended to diagnose infection nor to guide or monitor treatment.     Studies/Results: No results found.  Assessment:  urethral bleeding after traumatic Foley catheter removal on Plavix. This has responded to conservative therapy. He is no longer bleeding. It does not appear catheter will be necessary at this time. His hemoglobin appears to have remained stable.  Plan:  He will follow up with [place] as previously scheduled.  Chantele Corado C 07/04/2015, 7:24 AM

## 2015-07-04 NOTE — Progress Notes (Addendum)
2:00pm CSW and RNCM spoke with pt after he had sycopal episode with OT- pt still adamant about going home and states he has had those episodes for decades (1950).  Pt refuses to reconsider SNF and states he has a life alert/ home health equipment at home.    Per pt daughter pt does NOT have 24 hours supervision- grandchildren work contrary to patient report.  Pt daughter is agreeable to pt returning home since that is what the patient would like to do- daughter has concerns but is planning on moving in with patient in the near future.  CSW signing off.  10:20am CSW spoke with pt concerning return to [facility]- pt states that he is feeling much better and would rather return home with home health services.  Pt states that he will have grandson and granddaughter available during the day and then his daughter would be available at night.  PT will hopefully eval pt today for safety to go home- CSW will confirm family support if pt is still unsafe to be home without supervision.   Merlyn Lot, LCSWA Clinical Social Worker [phone]

## 2015-07-04 NOTE — Progress Notes (Signed)
Was notified that while working with OT patient had a very brief syncopal episode and patient noted to be orthostatic at that time. Patient improved quickly and was back to baseline. Patient and daughter had stated to OT that he's had these episodes for several decades since 1950 and needed no further workup. Patient refusing skilled nursing facility and adamantly wanting to go home with home health therapy and stated that he did have a life alert. TED hose have been ordered. Patient be discharged home with home health therapies in stable condition.

## 2015-07-04 NOTE — Care Management Note (Signed)
Case Management Note  Patient Details  Name: [name] MRN: [id number] Date of Birth: 04/14/1927  Subjective/Objective:    Pt admitted with Gross Hematuria           Action/Plan:  Pt is from [facility], however pt wants to return home with his family.  Per pt: Pt states that he will have grandson and granddaughter available during the day and then his daughter would be available at night, his daughter and son in law live in the home.  CSW consulted, PT ordered by MD.  CM will monitor for disposition needs   Expected Discharge Date:                  Expected Discharge Plan:  Home w Home Health Services  In-House Referral:  Clinical Social Work  Discharge planning Services  CM Consult  Post Acute Care Choice:    Choice offered to:  Patient  DME Arranged:    DME Agency:     HH Arranged:  RN, PT, OT HH Agency:  [facility]  Status of Service:  In process, will continue to follow  Medicare Important Message Given:    Date Medicare IM Given:    Medicare IM give by:    Date Additional Medicare IM Given:    Additional Medicare Important Message give by:     If discussed at Long Length of Stay Meetings, dates discussed:    Additional Comments: CM assessed pt; pt stated he wanted to go home instead of going back to SNF.  MD is aware of pts wishes; ordered PT eval prior to discharge, consulted with CSW and ordered [facility].    CM offered pt choice, he chose [facility], CM contacted agency, referral accepted.  Address and phone number verified against Epic.  Pt stated he has all the equipment that he needed; pt did not elaborate on specific equipment types.   [name], RN 07/04/2015, 11:09 AM

## 2015-07-15 ENCOUNTER — Encounter: Payer: Self-pay | Admitting: Cardiovascular Disease

## 2015-07-15 ENCOUNTER — Ambulatory Visit (INDEPENDENT_AMBULATORY_CARE_PROVIDER_SITE_OTHER): Payer: Self-pay | Admitting: Vascular Surgery

## 2015-07-15 ENCOUNTER — Telehealth: Payer: Self-pay

## 2015-07-15 ENCOUNTER — Encounter: Payer: Self-pay | Admitting: Vascular Surgery

## 2015-07-15 VITALS — BP 161/85 | HR 76 | Temp 97.6°F | Ht 69.0 in | Wt 138.0 lb

## 2015-07-15 DIAGNOSIS — I739 Peripheral vascular disease, unspecified: Secondary | ICD-10-CM

## 2015-07-15 NOTE — Progress Notes (Signed)
    Postoperative Visit   History of Present Illness  [name] is a 81 y.o. year old male who presents as an add-on today for drainage from right groin incision s/p right external iliac, common femoral and profundus endarterectomy with dacron patch angioplasty on 7/201/6. The patient resides in a skilled nursing facility. The patient notes resolution of right lower extremity rest pain. He does complain of a burning and stinging sensation to right anterior thigh.   He was readmitted by the medical service post-operatively on 07/03/15 for penile bleeding. His groin incision was inspected with no evidence of bleeding and hematoma. His CT scan did show small hematoma at site of surgery but was not concerning.  Urology was consulted and felt that his bleeding was secondary to traumatic removal of foley by patient in the setting of plavix. His penile bleeding has since resolved.   For VQI Use Only  PRE-ADM LIVING: Nursing home  AMB STATUS: Ambulatory  Physical Examination  Filed Vitals:   07/15/15 1421  BP: 161/85  Pulse:   Temp:    Right: Incisions are nearly healed, no drainage, no seroma or hematoma. Easily palpable right femoral pulse. Feet warm bilaterally.   Medical Decision Making  [name] is a 81 y.o. year old male who presents s/p right external iliac, common femoral and profundus endarterectomy with Dacron patch angioplasty.   The patient's right groin incision is healing appropriately with resolution of rest pain symptoms. No evidence of infection or seroma.  Discussed that right thigh paresthesias will improve with time and were due to femoral cutaneous nerve irritation during surgery.   Follow up in 3 months with ABIs.   Maris Berger, PA-C [facility] Office: [phone]   07/15/2015, 2:51 PM  This patient was seen and examined in conjunction with Dr. [name].   I have examined the patient, reviewed and agree with above. Has  completely resolved his right leg rest pain. Right groin is healing with no concern.  Gretta Began, MD 07/15/2015 3:24 PM

## 2015-07-15 NOTE — Telephone Encounter (Signed)
Phone call from pt's [facility] RN.  Reported pt. Has constant drainage of clear fluid from right groin incision.  Reported pt. is afebrile.  Denied any peri-incisional redness, or pain.  Stated there is some swelling present in right groin, but has only seen pt. Once, so unable to know if the swelling has increased.  Discussed with Dr. [name].  Recommended to schedule pt. to come in today for eval.  Pt. notified of 2:30 PM appt., today.  Agreed with plan.

## 2015-07-15 NOTE — Addendum Note (Signed)
Addended by: Adria Dill L on: 07/15/2015 04:23 PM   Modules accepted: Orders

## 2015-07-22 ENCOUNTER — Encounter: Payer: Medicare Other | Admitting: Vascular Surgery

## 2015-07-23 ENCOUNTER — Ambulatory Visit (INDEPENDENT_AMBULATORY_CARE_PROVIDER_SITE_OTHER): Payer: Medicare Other | Admitting: Cardiology

## 2015-07-23 ENCOUNTER — Encounter: Payer: Self-pay | Admitting: Cardiology

## 2015-07-23 VITALS — BP 162/70 | HR 81 | Ht 69.0 in | Wt 141.0 lb

## 2015-07-23 DIAGNOSIS — I25708 Atherosclerosis of coronary artery bypass graft(s), unspecified, with other forms of angina pectoris: Secondary | ICD-10-CM | POA: Diagnosis not present

## 2015-07-23 DIAGNOSIS — D500 Iron deficiency anemia secondary to blood loss (chronic): Secondary | ICD-10-CM | POA: Diagnosis not present

## 2015-07-23 DIAGNOSIS — I10 Essential (primary) hypertension: Secondary | ICD-10-CM | POA: Diagnosis not present

## 2015-07-23 DIAGNOSIS — I5042 Chronic combined systolic (congestive) and diastolic (congestive) heart failure: Secondary | ICD-10-CM

## 2015-07-23 DIAGNOSIS — I739 Peripheral vascular disease, unspecified: Secondary | ICD-10-CM | POA: Diagnosis not present

## 2015-07-23 DIAGNOSIS — I2510 Atherosclerotic heart disease of native coronary artery without angina pectoris: Secondary | ICD-10-CM

## 2015-07-23 NOTE — Patient Instructions (Signed)
Medication Instructions:   Your physician recommends that you continue on your current medications as directed. Please refer to the Current Medication list given to you today.    Labwork:  TODAY---BMET AND CBC W DIFF     Follow-Up:  3 MONTHS WITH DR Delton See

## 2015-07-23 NOTE — Progress Notes (Signed)
Patient ID: [name], male   DOB: 10/29/1927, 81 y.o.   MRN: [id number]       Cardiology Office Note   Date:  07/23/2015   ID:  [name], DOB 1927-04-23, MRN [id number]  PCP:  Delorse Lek, MD  Cardiologist:  Lars Masson, MD   Chief complain: post hospitalization follow up   History of Present Illness: [name] is a 81 y.o. male with a h/o symptomatic sinus bradycardia and syncope sp PPM (MDT) by Dr Jenne Campus with generator change 08/2010 by the [facility], seen by Dr [name] on 12/13/2013. His past medical history is significant for CAD, s/p CABG, PVD, CVA, COPD, and hypothyroidism.  He remains very active for his age and has no functional limitations. Unfortunately, he continues to smoke and has not desire to quit. He is grieving his spouse who died several years ago (after 59 years and 1 day of marriage). He appears to live alone now.  He was admitted on 12/30/2013 with acute bronchitis and NSTEMI with maximum TnI 5.03 in the settings of an acute infection. In hospital Lexiscan showed mid and basal inferolateral scar with peri-infarct ischemia.  The patient is coming 1 week after discharge, he complains of profound fatigue and DOE. He states that he felt like this before his CABG surgery.   His stress test was positive for inferolateral scar with some reversibility and underwent cardiac cath on 01/17/14 with findings of severe disease in body of SVG to OM  S/p successful PTCA/bare metal stent x 1 mid body of SVG to OM.  Today the patient states that he feels great, he is back to repairing cars and denies any chest pain, shortness of breath or fatigue that was his main presentation before. He has orthostatic hypotension has also resolved. He denies any palpitations or syncope. His only complaint today is mild lower extremity edema that is up to his knees.  07/20/2014 - the patient complains of dizzy spells, he recently had a syncopal episode at the [facility] when he was waiting  in line. No palpitations, no chest pain or shortness of breath. He stays active. He is compliant with his meds and buttocks all of them at night.  07/23/15 - the patient underwent a right s/p right external iliac, common femoral and profundus endarterectomy with dacron patch angioplasty on 7/201/6. The patient notes resolution of right lower extremity rest pain. He does complain of a burning and stinging sensation to right anterior thigh. He was readmitted by the medical service post-operatively on 07/03/15 for penile bleeding. His groin incision was inspected with no evidence of bleeding and hematoma. His CT scan did show small hematoma at site of surgery but was not concerning. Urology was consulted and felt that his bleeding was secondary to traumatic removal of foley by patient in the setting of plavix. His penile bleeding has since resolved.    He denies any chest pain, DOE, stable chronic LE edema, no orthopnea, PND. No palpitations or syncope.   Past Medical History  Diagnosis Date  . Hypertension   . CAD (coronary artery disease)     a. s/p MI in 1988;  b. s/p CABG in 2000 (Dr. Barry Dienes) x 4, LIMA to LAD, SVG to diagonal, SVG to [facility], SVG to PDA;  c. 12/2013 NSTEMI/abnl MV;  d. 01/2014 Cath/PCI: native 3VD, VG->RCA 100, VG->OM aneurysmal 40ost/m, 33m(5.0x12 Veriflex BMS), VG->Diag aneurysmal, LIMA->LAD nl.  . TIA (transient ischemic attack)   . Hyperlipidemia   . COPD (chronic obstructive  pulmonary disease)   . CVA (cerebral infarction) 07/2011    Remote left brain infarct with sensory deficits and had a left internal carotid stent placed by Dr. [name] at that time, no recurrent CVA or TIAs or CNS events.  Marland Kitchen PAD (peripheral artery disease)     Stable. Had a past RFPBG by Dr. [name] in 2009. this graft is occluded with reconstitution in the popliteal artery, which was patent. Right tibial was occluded and 2-vessel runoff on angiography done by [name] in 12/2010.  . Sick sinus syndrome      PPM implanted for this and syncope   . Chronic systolic CHF (congestive heart failure)     Hospitalization for CHF from 05/04/13-05/06/13. Treated medically. Had possible pneumonia with short course of antibiotics.  . Abdominal bruit     Loud  . AAA (abdominal aortic aneurysm)     Has a known AAA of 3.2 x 3.4 cm by abdominal untrasound 03/27/12. Followup abdominal ultrasound on 03/29/13 showed a diameter of 3.6 cm, which is stable  . Peripheral vascular disease   . Hypothyroid     On supplement "don't know if I take RX or not for my thyroid" (01/16/2014)  . Stroke   . Syncope     tilt table test/8.10.01/orthostatic hypotension  . Orthostatic hypotension   . Presence of permanent cardiac pacemaker   . Pneumonia   . Headache     Past Surgical History  Procedure Laterality Date  . Femoral bypass Right ?2001  . Carotid stent Left 06/23/11    By [name]  . Coronary artery bypass graft  2000    1st in 2000 (Dr. Barry Dienes) x 4, LIMA to LAD, SVG to diagonal, SVG to OM4, SVG to PDA. Re-cath in May 2012 with patent LIMA to LAD, patent SVG to diagonal, patent SVG to OM with left-to-right collaterals to an occluded right.  . Coronary angioplasty with stent placement  2001-01/16/2014    "think today makes #4" (01/16/2014)  . Cardiac catheterization  2000  . Inguinal hernia repair Right 1980's  . Insert / replace / remove pacemaker      implanted 2003 by Dr Jenne Campus with gen change (MDT ADDRL1) [facility] by [facility]  . Cataract extraction w/ intraocular lens  implant, bilateral Bilateral   . Abdominal aortagram N/A 02/22/2012    Procedure: ABDOMINAL AORTAGRAM;  Surgeon: Nada Libman, MD;  Location: [facility] CATH LAB;  Service: Cardiovascular;  Laterality: N/A;  . Carotid angiogram Bilateral 02/22/2012    Procedure: CAROTID ANGIOGRAM;  Surgeon: Nada Libman, MD;  Location: [facility] CATH LAB;  Service: Cardiovascular;  Laterality: Bilateral;  . Left heart catheterization with coronary/graft angiogram N/A 01/16/2014     Procedure: LEFT HEART CATHETERIZATION WITH Isabel Caprice;  Surgeon: Kathleene Hazel, MD;  Location: [facility] CATH LAB;  Service: Cardiovascular;  Laterality: N/A;  . Abdominal aortagram    . Colonoscopy    . Peripheral vascular catheterization N/A 06/24/2015    Procedure: Abdominal Aortogram;  Surgeon: Nada Libman, MD;  Location: [facility] INVASIVE CV LAB;  Service: Cardiovascular;  Laterality: N/A;  . Endarterectomy femoral Right 06/25/2015    Procedure: ENDARTERECTOMY RIGHT FEMORAL ARTERY ;  Surgeon: Larina Earthly, MD;  Location[facility] OR;  Service: Vascular;  Laterality: Right;  . Patch angioplasty Right 06/25/2015    Procedure: RIGHT COMMON FEMORAL ARTERY AND PROFUNDA PATCH ANGIOPLASTY USING HEMASHIELD PLATINUM FINESSE PATCH ;  Surgeon: Larina Earthly, MD;  Location: [facility] OR;  Service: Vascular;  Laterality:  Right;     Current Outpatient Prescriptions  Medication Sig Dispense Refill  . acetaminophen (TYLENOL) 325 MG tablet Take 650 mg by mouth every 6 (six) hours as needed. ALSO 325 mg twice daily    . amLODipine (NORVASC) 5 MG tablet Take 5 mg by mouth daily.    Marland Kitchen atorvastatin (LIPITOR) 40 MG tablet Take 1 tablet (40 mg total) by mouth daily at 6 PM. 30 tablet 5  . clopidogrel (PLAVIX) 75 MG tablet Take 1 tablet (75 mg total) by mouth at bedtime. Resume in 1 week.    . feeding supplement (BOOST HIGH PROTEIN) LIQD Take 1 Container by mouth 3 (three) times daily between meals.    . feeding supplement, ENSURE COMPLETE, (ENSURE COMPLETE) LIQD Take 237 mLs by mouth 2 (two) times daily between meals. 60 Bottle 0  . finasteride (PROSCAR) 5 MG tablet Take 5 mg by mouth daily.    . furosemide (LASIX) 20 MG tablet TAKE 1 TABLET (20 MG TOTAL) BY MOUTH DAILY.  3  . isosorbide mononitrate (IMDUR) 30 MG 24 hr tablet Take 1 tablet (30 mg total) by mouth daily. 30 tablet 3  . metoprolol succinate (TOPROL-XL) 100 MG 24 hr tablet Take 100 mg by mouth daily. Take with or immediately following a meal.    .  nitroGLYCERIN (NITROSTAT) 0.4 MG SL tablet Place 1 tablet (0.4 mg total) under the tongue every 5 (five) minutes as needed. For chest pain 20 tablet 12  . sertraline (ZOLOFT) 100 MG tablet Take 50 mg by mouth at bedtime.    . Tamsulosin HCl (FLOMAX) 0.4 MG CAPS Take 0.4 mg by mouth daily after supper.     . traMADol (ULTRAM) 50 MG tablet Take 1 tablet (50 mg total) by mouth every 6 (six) hours as needed (FOR BREAKTHROUGH PAIN). 20 tablet 0   No current facility-administered medications for this visit.    Allergies:   Pravachol; Simvastatin; Other; Oxycodone; and Terazosin    Social History:  The patient  reports that he quit smoking about 2 months ago. His smoking use included Cigarettes. He has a 34 pack-year smoking history. He has never used smokeless tobacco. He reports that he does not drink alcohol or use illicit drugs.   Family History:  The patient's family history includes Cancer (age of onset: 8) in his brother; Hyperlipidemia in his brother; Hypertension in his brother; Lung disease in his father; Prostate cancer in his brother.    ROS:  Please see the history of present illness.   Otherwise, review of systems are positive for none.   All other systems are reviewed and negative.    PHYSICAL EXAM: VS:  BP 162/70 mmHg  Pulse 81  Ht 5\' 9"  (1.753 m)  Wt 141 lb (63.957 kg)  BMI 20.81 kg/m2  SpO2 97% , BMI Body mass index is 20.81 kg/(m^2). GEN: Well nourished, well developed, in no acute distress HEENT: normal Neck: no JVD, carotid bruits, or masses Cardiac: RRR; 2/6 systolic murmur, rubs, or gallops,right LE edema, healing scar in the right groin with minimal hematoma, no bleeding  Respiratory:  clear to auscultation bilaterally, normal work of breathing GI: soft, nontender, nondistended, + BS MS: no deformity or atrophy Skin: warm and dry, no rash Neuro:  Strength and sensation are intact Psych: euthymic mood, full affect   Recent Labs: 11/19/2014: Pro B Natriuretic  peptide (BNP) 4549.0* 05/13/2015: Magnesium 2.2 07/02/2015: ALT 17 07/04/2015: BUN 19; Creatinine, Ser 1.47*; Hemoglobin 9.7*; Platelets 230; Potassium 4.0; Sodium  142    Lipid Panel    Component Value Date/Time   CHOL 101 07/06/2011 0635   TRIG 61 07/06/2011 0635   HDL 39* 07/06/2011 0635   CHOLHDL 2.6 07/06/2011 0635   VLDL 12 07/06/2011 0635   LDLCALC 50 07/06/2011 0635      Wt Readings from Last 3 Encounters:  07/23/15 141 lb (63.957 kg)  07/15/15 138 lb (62.596 kg)  07/03/15 139 lb 12.4 oz (63.4 kg)      ASSESSMENT AND PLAN:  81 year old male   1. CAD, NSTEMI on 12/30/2013 - in the settings of acute URI, maximum troponin 5, Positive Lexiscan nuclear stress test with mid and basal inferolateral scar with periinfarct ischemia, moderate LV dysfunction 40%, cardiac cath on 01/17/14 showed severe disease in body of SVG to OM, s/p successful PTCA/bare metal stent x 1 mid body of SVG to OM. ASA and Plavix for at least one month but longer if he tolerates well. Continue metoprolol, atorvastatin.  He is asymptomatic.  2. Acute on chronic systolic CHF - LVEF 35%, he is euvolemic.  3.Chronic CKD - baseline Crea 1.3 -1.5, the last in the hospital 1.4, we will recheck. Today.  4. S/P PPM for symptomatic bradycardia - followed by Dr [name], working well  5. Hypertension - uncontrolled, however he feels dizzy if higher dosages of meds, probably OK BP for his age.  6. Orthostatic hypotension - as above   7. PAD - S/P right s/p right external iliac, common femoral and profundus endarterectomy with dacron patch angioplasty on 7/201/6 with groin hematoma - no bruit, resolving hematoma, we will check Hb today.  Labs/ tests ordered today include:  Orders Placed This Encounter  Procedures  . Basic Metabolic Panel (BMET)  . CBC w/Diff   Follow up in 6 months.  Signed, Lars Masson, MD  07/23/2015 5:12 PM    [facility] [address] [place], [place], [place]   [address] Phone: [phone]; Fax: [phone]

## 2015-07-24 ENCOUNTER — Telehealth: Payer: Self-pay | Admitting: *Deleted

## 2015-07-24 LAB — CBC WITH DIFFERENTIAL/PLATELET
Basophils Absolute: 0.1 10*3/uL (ref 0.0–0.1)
Basophils Relative: 0.6 % (ref 0.0–3.0)
Eosinophils Absolute: 0.2 10*3/uL (ref 0.0–0.7)
Eosinophils Relative: 2.8 % (ref 0.0–5.0)
HCT: 35.6 % — ABNORMAL LOW (ref 39.0–52.0)
Hemoglobin: 11.6 g/dL — ABNORMAL LOW (ref 13.0–17.0)
Lymphocytes Relative: 39.3 % (ref 12.0–46.0)
Lymphs Abs: 3.1 10*3/uL (ref 0.7–4.0)
MCHC: 32.6 g/dL (ref 30.0–36.0)
MCV: 95.7 fl (ref 78.0–100.0)
Monocytes Absolute: 0.5 10*3/uL (ref 0.1–1.0)
Monocytes Relative: 6.4 % (ref 3.0–12.0)
Neutro Abs: 4 10*3/uL (ref 1.4–7.7)
Neutrophils Relative %: 50.9 % (ref 43.0–77.0)
Platelets: 122 10*3/uL — ABNORMAL LOW (ref 150.0–400.0)
RBC: 3.72 Mil/uL — ABNORMAL LOW (ref 4.22–5.81)
RDW: 18.3 % — ABNORMAL HIGH (ref 11.5–15.5)
WBC: 7.9 10*3/uL (ref 4.0–10.5)

## 2015-07-24 LAB — BASIC METABOLIC PANEL
BUN: 19 mg/dL (ref 6–23)
CO2: 30 mEq/L (ref 19–32)
Calcium: 8.5 mg/dL (ref 8.4–10.5)
Chloride: 108 mEq/L (ref 96–112)
Creatinine, Ser: 1.35 mg/dL (ref 0.40–1.50)
GFR: 64.38 mL/min (ref >60.00)
Glucose, Bld: 108 mg/dL — ABNORMAL HIGH (ref 70–99)
Potassium: 3.2 mEq/L — ABNORMAL LOW (ref 3.5–5.1)
Sodium: 144 mEq/L (ref 135–145)

## 2015-07-24 MED ORDER — POTASSIUM CHLORIDE ER 10 MEQ PO TBCR: 10.0000 meq | EXTENDED_RELEASE_TABLET | Freq: Every day | ORAL | Status: DC

## 2015-07-24 NOTE — Telephone Encounter (Signed)
Notified the pt that per Dr Delton See his labs showed that his creatinine has improved, and his hemoglobin has improved, but his K level was mildly decreased at 3.2.  Informed the pt that per Dr Delton See, she recommends that he start taking KCL 10 mEq po daily, for his low potassium level.  Confirmed the pharmacy of choice with the pt.  Pt verbalized understanding and agrees with this plan.

## 2015-07-24 NOTE — Telephone Encounter (Signed)
-----   Message from Lars Masson, MD sent at 07/24/2015  2:43 PM EDT ----- His crea is improved, he needs to start taking 10 mEq of K daily. Hb is improved.

## 2015-07-28 ENCOUNTER — Ambulatory Visit: Payer: Medicare Other | Admitting: Cardiology

## 2015-08-07 ENCOUNTER — Encounter: Payer: Self-pay | Admitting: Cardiovascular Disease

## 2015-08-10 ENCOUNTER — Emergency Department (HOSPITAL_COMMUNITY)
Admission: EM | Admit: 2015-08-10 | Discharge: 2015-08-11 | Disposition: A | Discharge status: Home / Self Care | Payer: Medicare Other | Attending: Emergency Medicine | Admitting: Emergency Medicine

## 2015-08-10 ENCOUNTER — Encounter (HOSPITAL_COMMUNITY): Payer: Self-pay | Admitting: Emergency Medicine

## 2015-08-10 DIAGNOSIS — Z950 Presence of cardiac pacemaker: Secondary | ICD-10-CM | POA: Diagnosis not present

## 2015-08-10 DIAGNOSIS — Z87891 Personal history of nicotine dependence: Secondary | ICD-10-CM | POA: Insufficient documentation

## 2015-08-10 DIAGNOSIS — Z79899 Other long term (current) drug therapy: Secondary | ICD-10-CM | POA: Insufficient documentation

## 2015-08-10 DIAGNOSIS — Z951 Presence of aortocoronary bypass graft: Secondary | ICD-10-CM | POA: Insufficient documentation

## 2015-08-10 DIAGNOSIS — I5022 Chronic systolic (congestive) heart failure: Secondary | ICD-10-CM | POA: Diagnosis not present

## 2015-08-10 DIAGNOSIS — Z9889 Other specified postprocedural states: Secondary | ICD-10-CM | POA: Diagnosis not present

## 2015-08-10 DIAGNOSIS — Z7902 Long term (current) use of antithrombotics/antiplatelets: Secondary | ICD-10-CM | POA: Insufficient documentation

## 2015-08-10 DIAGNOSIS — J449 Chronic obstructive pulmonary disease, unspecified: Secondary | ICD-10-CM | POA: Insufficient documentation

## 2015-08-10 DIAGNOSIS — E039 Hypothyroidism, unspecified: Secondary | ICD-10-CM | POA: Diagnosis not present

## 2015-08-10 DIAGNOSIS — Z9861 Coronary angioplasty status: Secondary | ICD-10-CM | POA: Insufficient documentation

## 2015-08-10 DIAGNOSIS — I2510 Atherosclerotic heart disease of native coronary artery without angina pectoris: Secondary | ICD-10-CM | POA: Diagnosis not present

## 2015-08-10 DIAGNOSIS — Z8701 Personal history of pneumonia (recurrent): Secondary | ICD-10-CM | POA: Diagnosis not present

## 2015-08-10 DIAGNOSIS — I10 Essential (primary) hypertension: Secondary | ICD-10-CM | POA: Insufficient documentation

## 2015-08-10 DIAGNOSIS — Z8673 Personal history of transient ischemic attack (TIA), and cerebral infarction without residual deficits: Secondary | ICD-10-CM | POA: Insufficient documentation

## 2015-08-10 DIAGNOSIS — R339 Retention of urine, unspecified: Secondary | ICD-10-CM | POA: Insufficient documentation

## 2015-08-10 LAB — URINE MICROSCOPIC-ADD ON

## 2015-08-10 LAB — URINALYSIS, ROUTINE W REFLEX MICROSCOPIC
Bilirubin Urine: NEGATIVE
GLUCOSE, UA: NEGATIVE mg/dL
Ketones, ur: NEGATIVE mg/dL
LEUKOCYTES UA: NEGATIVE
NITRITE: NEGATIVE
PROTEIN: NEGATIVE mg/dL
Specific Gravity, Urine: 1.011 (ref 1.005–1.030)
Urobilinogen, UA: 1 mg/dL (ref 0.0–1.0)
pH: 7 (ref 5.0–8.0)

## 2015-08-10 NOTE — ED Notes (Signed)
Per EMS- had inguinal blood clot removed last month. Pulled foley catheter out recently-urinary problems since then. Daughter reports she thinks patient has not urinated in 12 hours. Urine output of 100 cc en route. Using urinal currently. Pain now 5/10. Patient cannot recall last BM-thinks it may have been yesterday. Does have pacemaker. VS: BP 192/105 HR 83. 18 G in LAC.

## 2015-08-10 NOTE — ED Notes (Signed)
Bed: WA08 Expected date:  Expected time:  Means of arrival:  Comments: EMS 81 yo male from home/urinary problem/pacer

## 2015-08-10 NOTE — ED Provider Notes (Signed)
CSN: [id number]     Arrival date & time 08/10/15  2148 History   First MD Initiated Contact with Patient 08/10/15 2237     Chief Complaint  Patient presents with  . Urinary Retention     (Consider location/radiation/quality/duration/timing/severity/associated sxs/prior Treatment) HPI Comments: Patient reports that she has the urge to urinate and has been unable to urinate since about 5 PM does not have a history of urinary tract infections versus urinary retention.  He states he was in the hospital about a month ago for heart failure.  He did have a Foley catheter in place and accidentally pulled out with the balloon inflated, but he's had no problems since that time.  Patient states he was at a barbecue today and he had little to drink he's had no nausea, vomiting or diarrhea  The history is provided by the patient.    Past Medical History  Diagnosis Date  . Hypertension   . CAD (coronary artery disease)     a. s/p MI in 1988;  b. s/p CABG in 2000 (Dr. Barry Dienes) x 4, LIMA to LAD, SVG to diagonal, SVG to [facility], SVG to PDA;  c. 12/2013 NSTEMI/abnl MV;  d. 01/2014 Cath/PCI: native 3VD, VG->RCA 100, VG->OM aneurysmal 40ost/m, 47m(5.0x12 Veriflex BMS), VG->Diag aneurysmal, LIMA->LAD nl.  . TIA (transient ischemic attack)   . Hyperlipidemia   . COPD (chronic obstructive pulmonary disease)   . CVA (cerebral infarction) 07/2011    Remote left brain infarct with sensory deficits and had a left internal carotid stent placed by Dr. [name] at that time, no recurrent CVA or TIAs or CNS events.  Marland Kitchen PAD (peripheral artery disease)     Stable. Had a past RFPBG by Dr. [name] in 2009. this graft is occluded with reconstitution in the popliteal artery, which was patent. Right tibial was occluded and 2-vessel runoff on angiography done by [name] in 12/2010.  . Sick sinus syndrome     PPM implanted for this and syncope   . Chronic systolic CHF (congestive heart failure)     Hospitalization for CHF from  05/04/13-05/06/13. Treated medically. Had possible pneumonia with short course of antibiotics.  . Abdominal bruit     Loud  . AAA (abdominal aortic aneurysm)     Has a known AAA of 3.2 x 3.4 cm by abdominal untrasound 03/27/12. Followup abdominal ultrasound on 03/29/13 showed a diameter of 3.6 cm, which is stable  . Peripheral vascular disease   . Hypothyroid     On supplement "don't know if I take RX or not for my thyroid" (01/16/2014)  . Stroke   . Syncope     tilt table test/8.10.01/orthostatic hypotension  . Orthostatic hypotension   . Presence of permanent cardiac pacemaker   . Pneumonia   . Headache    Past Surgical History  Procedure Laterality Date  . Femoral bypass Right ?2001  . Carotid stent Left 06/23/11    By [name]  . Coronary artery bypass graft  2000    1st in 2000 (Dr. Barry Dienes) x 4, LIMA to LAD, SVG to diagonal, SVG to OM4, SVG to PDA. Re-cath in May 2012 with patent LIMA to LAD, patent SVG to diagonal, patent SVG to OM with left-to-right collaterals to an occluded right.  . Coronary angioplasty with stent placement  2001-01/16/2014    "think today makes #4" (01/16/2014)  . Cardiac catheterization  2000  . Inguinal hernia repair Right 1980's  . Insert / replace / remove pacemaker  implanted 2003 by Dr Jenne Campus with gen change (MDT [id number]) 08/2010 by [facility]  . Cataract extraction w/ intraocular lens  implant, bilateral Bilateral   . Abdominal aortagram N/A 02/22/2012    Procedure: ABDOMINAL AORTAGRAM;  Surgeon: Nada Libman, MD;  Location: [facility] CATH LAB;  Service: Cardiovascular;  Laterality: N/A;  . Carotid angiogram Bilateral 02/22/2012    Procedure: CAROTID ANGIOGRAM;  Surgeon: Nada Libman, MD;  Location: [facility] CATH LAB;  Service: Cardiovascular;  Laterality: Bilateral;  . Left heart catheterization with coronary/graft angiogram N/A 01/16/2014    Procedure: LEFT HEART CATHETERIZATION WITH Isabel Caprice;  Surgeon: Kathleene Hazel, MD;  Location: [facility] CATH  LAB;  Service: Cardiovascular;  Laterality: N/A;  . Abdominal aortagram    . Colonoscopy    . Peripheral vascular catheterization N/A 06/24/2015    Procedure: Abdominal Aortogram;  Surgeon: Nada Libman, MD;  Location: [facility] INVASIVE CV LAB;  Service: Cardiovascular;  Laterality: N/A;  . Endarterectomy femoral Right 06/25/2015    Procedure: ENDARTERECTOMY RIGHT FEMORAL ARTERY ;  Surgeon: Larina Earthly, MD;  Location: [facility] OR;  Service: Vascular;  Laterality: Right;  . Patch angioplasty Right 06/25/2015    Procedure: RIGHT COMMON FEMORAL ARTERY AND PROFUNDA PATCH ANGIOPLASTY USING HEMASHIELD PLATINUM FINESSE PATCH ;  Surgeon: Larina Earthly, MD;  Location: [facility] OR;  Service: Vascular;  Laterality: Right;   Family History  Problem Relation Age of Onset  . Cancer Brother 60    oral   . Prostate cancer Brother   . Hypertension Brother   . Hyperlipidemia Brother   . Lung disease Father     black lung disease   Social History  Substance Use Topics  . Smoking status: Former Smoker -- 0.50 packs/day for 68 years    Types: Cigarettes    Quit date: 05/02/2015  . Smokeless tobacco: Never Used  . Alcohol Use: No    Review of Systems  Constitutional: Positive for appetite change. Negative for fever and activity change.  Respiratory: Negative for cough and shortness of breath.   Gastrointestinal: Positive for abdominal distention. Negative for diarrhea.  Genitourinary: Positive for difficulty urinating. Negative for dysuria, frequency, hematuria and flank pain.  Musculoskeletal: Negative for back pain.      Allergies  Pravachol; Simvastatin; Other; Oxycodone; and Terazosin  Home Medications   Prior to Admission medications   Medication Sig Start Date End Date Taking? Authorizing Provider  acetaminophen (TYLENOL) 325 MG tablet Take 650 mg by mouth every 6 (six) hours as needed. ALSO 325 mg twice daily   Yes Historical Provider, MD  amLODipine (NORVASC) 5 MG tablet Take 5 mg by mouth daily.    Yes Historical Provider, MD  atorvastatin (LIPITOR) 40 MG tablet Take 1 tablet (40 mg total) by mouth daily at 6 PM. 05/14/15  Yes Brittainy Sherlynn Carbon, PA-C  clopidogrel (PLAVIX) 75 MG tablet Take 1 tablet (75 mg total) by mouth at bedtime. Resume in 1 week. 07/11/15  Yes Rodolph Bong, MD  feeding supplement (BOOST HIGH PROTEIN) LIQD Take 1 Container by mouth 3 (three) times daily between meals.   Yes Historical Provider, MD  finasteride (PROSCAR) 5 MG tablet Take 5 mg by mouth daily.   Yes Historical Provider, MD  furosemide (LASIX) 20 MG tablet TAKE 1 TABLET (20 MG TOTAL) BY MOUTH DAILY. 06/16/15  Yes Historical Provider, MD  isosorbide mononitrate (IMDUR) 30 MG 24 hr tablet Take 1 tablet (30 mg total) by mouth daily. 05/28/15  Yes Leone Brand,  NP  metoprolol succinate (TOPROL-XL) 100 MG 24 hr tablet Take 100 mg by mouth daily. Take with or immediately following a meal.   Yes Historical Provider, MD  nitroGLYCERIN (NITROSTAT) 0.4 MG SL tablet Place 1 tablet (0.4 mg total) under the tongue every 5 (five) minutes as needed. For chest pain 06/11/14  Yes Hollice Espy, MD  potassium chloride (K-DUR) 10 MEQ tablet Take 1 tablet (10 mEq total) by mouth daily. 07/24/15  Yes Lars Masson, MD  sertraline (ZOLOFT) 100 MG tablet Take 50 mg by mouth at bedtime.   Yes Historical Provider, MD  Tamsulosin HCl (FLOMAX) 0.4 MG CAPS Take 0.4 mg by mouth daily after supper.    Yes Historical Provider, MD  feeding supplement, ENSURE COMPLETE, (ENSURE COMPLETE) LIQD Take 237 mLs by mouth 2 (two) times daily between meals. Patient not taking: Reported on 08/10/2015 01/05/14   Renae Fickle, MD  traMADol (ULTRAM) 50 MG tablet Take 1 tablet (50 mg total) by mouth every 6 (six) hours as needed (FOR BREAKTHROUGH PAIN). Patient not taking: Reported on 08/10/2015 07/04/15   Rodolph Bong, MD   BP 179/73 mmHg  Pulse 73  Temp(Src) 97.9 F (36.6 C) (Oral)  Resp 16  SpO2 100% Physical Exam  Constitutional: He  appears well-developed and well-nourished.  HENT:  Head: Normocephalic.  Eyes: Pupils are equal, round, and reactive to light.  Neck: Normal range of motion.  Cardiovascular: Normal rate.   Pulmonary/Chest: Effort normal.  Abdominal: Bowel sounds are normal. He exhibits distension. There is no tenderness.  Genitourinary:  Patient was able to urinate approximately 250 cc of urine.  He was bladder scanned immediately after that and he said had an additional 250 cc of urine in his bladder  Musculoskeletal: Normal range of motion.  Neurological: He is alert.  Skin: Skin is warm.  Vitals reviewed.   ED Course  Procedures (including critical care time) Labs Review Labs Reviewed  URINALYSIS, ROUTINE W REFLEX MICROSCOPIC (NOT AT [facility]) - Abnormal; Notable for the following:    Hgb urine dipstick MODERATE (*)    All other components within normal limits  URINE MICROSCOPIC-ADD ON    Imaging Review No results found. I have personally reviewed and evaluated these images and lab results as part of my medical decision-making.   EKG Interpretation None     Patient was able to empty approximately half of the volume in his bladder.  He still feels he has the urge to urinate.  Patient has no sign of urinary tract infection.  Foley will be placed and allow patient to follow-up with urology next week.  She was able to urinate again and he was scanned again right after voiding.  He still has over 200 cc in his bladder.  A Foley will be placed with urology follow-up MDM   Final diagnoses:  Urinary retention         Earley Favor, NP 08/11/15 0006  Earley Favor, NP 08/11/15 [id number]  Bethann Berkshire, MD 08/12/15 1535

## 2015-08-10 NOTE — ED Notes (Signed)
Pt. On monitor. 

## 2015-08-11 NOTE — Discharge Instructions (Signed)
Acute Urinary Retention Acute urinary retention is the temporary inability to urinate. This is a common problem in older men. As men age their prostates become larger and block the flow of urine from the bladder. This is usually a problem that has come on gradually.  HOME CARE INSTRUCTIONS If you are sent home with a Foley catheter and a drainage system, you will need to discuss the best course of action with your health care provider. While the catheter is in, maintain a good intake of fluids. Keep the drainage bag emptied and lower than your catheter. This is so that contaminated urine will not flow back into your bladder, which could lead to a urinary tract infection. There are two main types of drainage bags. One is a large bag that usually is used at night. It has a good capacity that will allow you to sleep through the night without having to empty it. The second type is called a leg bag. It has a smaller capacity, so it needs to be emptied more frequently. However, the main advantage is that it can be attached by a leg strap and can go underneath your clothing, allowing you the freedom to move about or leave your home. Only take over-the-counter or prescription medicines for pain, discomfort, or fever as directed by your health care provider.  SEEK MEDICAL CARE IF:  You develop a low-grade fever.  You experience spasms or leakage of urine with the spasms. SEEK IMMEDIATE MEDICAL CARE IF:   You develop chills or fever.  Your catheter stops draining urine.  Your catheter falls out.  You start to develop increased bleeding that does not respond to rest and increased fluid intake. MAKE SURE YOU:  Understand these instructions.  Will watch your condition.  Will get help right away if you are not doing well or get worse. Document Released: 02/28/2001 Document Revised: 11/27/2013 Document Reviewed: 05/03/2013 [facility] Patient Information 2015 [place], [place]. This information is not  intended to replace advice given to you by your health care provider. Make sure you discuss any questions you have with your health care provider. 4.  Catheter is been left in place to help you empty your bladder.  You do not have any sign of a urinary tract infection at this time.  You've been given a referral to urology.  Please call first thing Tuesday morning to set an appointment for follow-up

## 2015-10-10 ENCOUNTER — Other Ambulatory Visit: Payer: Self-pay | Admitting: Cardiology

## 2015-10-16 ENCOUNTER — Encounter: Payer: Self-pay | Admitting: Vascular Surgery

## 2015-10-21 ENCOUNTER — Encounter: Payer: Medicare Other | Admitting: Vascular Surgery

## 2015-10-21 ENCOUNTER — Inpatient Hospital Stay (HOSPITAL_COMMUNITY): Admission: RE | Admit: 2015-10-21 | Payer: Medicare Other | Source: Ambulatory Visit

## 2015-10-21 ENCOUNTER — Ambulatory Visit: Payer: Medicare Other | Admitting: Vascular Surgery

## 2015-10-27 ENCOUNTER — Encounter: Payer: Self-pay | Admitting: Cardiology

## 2015-10-27 ENCOUNTER — Ambulatory Visit (INDEPENDENT_AMBULATORY_CARE_PROVIDER_SITE_OTHER): Payer: Medicare Other | Admitting: Cardiology

## 2015-10-27 VITALS — BP 146/70 | HR 84 | Ht 68.0 in | Wt 145.8 lb

## 2015-10-27 DIAGNOSIS — I255 Ischemic cardiomyopathy: Secondary | ICD-10-CM | POA: Diagnosis not present

## 2015-10-27 DIAGNOSIS — I2510 Atherosclerotic heart disease of native coronary artery without angina pectoris: Secondary | ICD-10-CM | POA: Diagnosis not present

## 2015-10-27 DIAGNOSIS — Z951 Presence of aortocoronary bypass graft: Secondary | ICD-10-CM | POA: Diagnosis not present

## 2015-10-27 DIAGNOSIS — I10 Essential (primary) hypertension: Secondary | ICD-10-CM

## 2015-10-27 DIAGNOSIS — I5043 Acute on chronic combined systolic (congestive) and diastolic (congestive) heart failure: Secondary | ICD-10-CM

## 2015-10-27 DIAGNOSIS — I2583 Coronary atherosclerosis due to lipid rich plaque: Secondary | ICD-10-CM

## 2015-10-27 MED ORDER — FUROSEMIDE 20 MG PO TABS: 20.0000 mg | ORAL_TABLET | Freq: Every day | ORAL | Status: DC

## 2015-10-27 MED ORDER — AMLODIPINE BESYLATE 5 MG PO TABS: 5.0000 mg | ORAL_TABLET | Freq: Every day | ORAL | Status: DC

## 2015-10-27 MED ORDER — NITROGLYCERIN 0.4 MG SL SUBL: 0.4000 mg | SUBLINGUAL_TABLET | SUBLINGUAL | Status: AC | PRN

## 2015-10-27 NOTE — Patient Instructions (Signed)
**Note De-Identified [name] Obfuscation** Medication Instructions:  Start taking Lasix 20 mg daily  Labwork: None  Testing/Procedures: None  Follow-Up: Your physician recommends that you schedule a follow-up appointment in: 3 months      If you need a refill on your cardiac medications before your next appointment, please call your pharmacy.

## 2015-10-27 NOTE — Progress Notes (Signed)
Patient ID: [name], male   DOB: 1927/01/02, 81 y.o.   MRN: [id number]      Cardiology Office Note   Date:  10/27/2015   ID:  [name], DOB [birth year], MRN [id number]  PCP:  Delorse Lek, MD  Cardiologist:  Lars Masson, MD   Chief complain: post hospitalization follow up   History of Present Illness: [name] is a 81 y.o. male with a h/o symptomatic sinus bradycardia and syncope sp PPM (MDT) by Dr Jenne Campus with generator change 08/2010 by the [facility], seen by Dr [name] on 12/13/2013. His past medical history is significant for CAD, s/p CABG, PVD, CVA, COPD, and hypothyroidism.  He remains very active for his age and has no functional limitations. Unfortunately, he continues to smoke and has not desire to quit. He is grieving his spouse who died several years ago (after 59 years and 1 day of marriage). He appears to live alone now.  He was admitted on 12/30/2013 with acute bronchitis and NSTEMI with maximum TnI 5.03 in the settings of an acute infection. In hospital Lexiscan showed mid and basal inferolateral scar with peri-infarct ischemia.  The patient is coming 1 week after discharge, he complains of profound fatigue and DOE. He states that he felt like this before his CABG surgery.   His stress test was positive for inferolateral scar with some reversibility and underwent cardiac cath on 01/17/14 with findings of severe disease in body of SVG to OM  S/p successful PTCA/bare metal stent x 1 mid body of SVG to OM.  Today the patient states that he feels great, he is back to repairing cars and denies any chest pain, shortness of breath or fatigue that was his main presentation before. He has orthostatic hypotension has also resolved. He denies any palpitations or syncope. His only complaint today is mild lower extremity edema that is up to his knees.  07/20/2014 - the patient complains of dizzy spells, he recently had a syncopal episode at the [facility] when he was waiting  in line. No palpitations, no chest pain or shortness of breath. He stays active. He is compliant with his meds and buttocks all of them at night.  07/23/15 - the patient underwent a right s/p right external iliac, common femoral and profundus endarterectomy with dacron patch angioplasty on 7/201/6. The patient notes resolution of right lower extremity rest pain. He does complain of a burning and stinging sensation to right anterior thigh. He was readmitted by the medical service post-operatively on 07/03/15 for penile bleeding. His groin incision was inspected with no evidence of bleeding and hematoma. His CT scan did show small hematoma at site of surgery but was not concerning. Urology was consulted and felt that his bleeding was secondary to traumatic removal of foley by patient in the setting of plavix. His penile bleeding has since resolved.    10/27/2015 - the patient is overall doing well, he has ran out of amlodipine and sl NTG, he has neen experiencing chronic left sided occipital headache, no visual changes. He has no chest pain, no claudications, mild RLE edema. No orthopnea, PND. No palpitations or syncope.   Past Medical History  Diagnosis Date  . Hypertension   . CAD (coronary artery disease)     a. s/p MI in 1988;  b. s/p CABG in 2000 (Dr. Barry Dienes) x 4, LIMA to LAD, SVG to diagonal, SVG to [facility], SVG to PDA;  c. 12/2013 NSTEMI/abnl MV;  d. 01/2014 Cath/PCI: native 3VD,  VG->RCA 100, VG->OM aneurysmal 40ost/m, 33m(5.0x12 Veriflex BMS), VG->Diag aneurysmal, LIMA->LAD nl.  . TIA (transient ischemic attack)   . Hyperlipidemia   . COPD (chronic obstructive pulmonary disease) (HCC)   . CVA (cerebral infarction) 07/2011    Remote left brain infarct with sensory deficits and had a left internal carotid stent placed by Dr. [name] at that time, no recurrent CVA or TIAs or CNS events.  Marland Kitchen PAD (peripheral artery disease) (HCC)     Stable. Had a past RFPBG by Dr. [name] in 2009. this graft is occluded  with reconstitution in the popliteal artery, which was patent. Right tibial was occluded and 2-vessel runoff on angiography done by [name] in 12/2010.  . Sick sinus syndrome (HCC)     PPM implanted for this and syncope   . Chronic systolic CHF (congestive heart failure) (HCC)     Hospitalization for CHF from 05/04/13-05/06/13. Treated medically. Had possible pneumonia with short course of antibiotics.  . Abdominal bruit     Loud  . AAA (abdominal aortic aneurysm) (HCC)     Has a known AAA of 3.2 x 3.4 cm by abdominal untrasound 03/27/12. Followup abdominal ultrasound on 03/29/13 showed a diameter of 3.6 cm, which is stable  . Peripheral vascular disease (HCC)   . Hypothyroid     On supplement "don't know if I take RX or not for my thyroid" (01/16/2014)  . Stroke (HCC)   . Syncope     tilt table test/8.10.01/orthostatic hypotension  . Orthostatic hypotension   . Presence of permanent cardiac pacemaker   . Pneumonia   . Headache     Past Surgical History  Procedure Laterality Date  . Femoral bypass Right ?2001  . Carotid stent Left 06/23/11    By [name]  . Coronary artery bypass graft  2000    1st in 2000 (Dr. Barry Dienes) x 4, LIMA to LAD, SVG to diagonal, SVG to OM4, SVG to PDA. Re-cath in May 2012 with patent LIMA to LAD, patent SVG to diagonal, patent SVG to OM with left-to-right collaterals to an occluded right.  . Coronary angioplasty with stent placement  2001-01/16/2014    "think today makes #4" (01/16/2014)  . Cardiac catheterization  2000  . Inguinal hernia repair Right 1980's  . Insert / replace / remove pacemaker      implanted 2003 by Dr Jenne Campus with gen change (MDT [id number]) [facility]  . Cataract extraction w/ intraocular lens  implant, bilateral Bilateral   . Abdominal aortagram N/A 02/22/2012    Procedure: ABDOMINAL AORTAGRAM;  Surgeon: Nada Libman, MD;  Location: [facility] CATH LAB;  Service: Cardiovascular;  Laterality: N/A;  . Carotid angiogram Bilateral 02/22/2012     Procedure: CAROTID ANGIOGRAM;  Surgeon: Nada Libman, MD;  Location: [facility] CATH LAB;  Service: Cardiovascular;  Laterality: Bilateral;  . Left heart catheterization with coronary/graft angiogram N/A 01/16/2014    Procedure: LEFT HEART CATHETERIZATION WITH Isabel Caprice;  Surgeon: Kathleene Hazel, MD;  Location: [facility] CATH LAB;  Service: Cardiovascular;  Laterality: N/A;  . Abdominal aortagram    . Colonoscopy    . Peripheral vascular catheterization N/A 06/24/2015    Procedure: Abdominal Aortogram;  Surgeon: Nada Libman, MD;  Location: [facility] INVASIVE CV LAB;  Service: Cardiovascular;  Laterality: N/A;  . Endarterectomy femoral Right 06/25/2015    Procedure: ENDARTERECTOMY RIGHT FEMORAL ARTERY ;  Surgeon: Larina Earthly, MD;  Location: [facility] OR;  Service: Vascular;  Laterality: Right;  . Patch angioplasty  Right 06/25/2015    Procedure: RIGHT COMMON FEMORAL ARTERY AND PROFUNDA PATCH ANGIOPLASTY USING HEMASHIELD PLATINUM FINESSE PATCH ;  Surgeon: Larina Earthly, MD;  Location: [facility] OR;  Service: Vascular;  Laterality: Right;     Current Outpatient Prescriptions  Medication Sig Dispense Refill  . acetaminophen (TYLENOL) 325 MG tablet Take 650 mg by mouth every 6 (six) hours as needed. ALSO 325 mg twice daily    . aspirin 81 MG tablet Take 81 mg by mouth daily.    Marland Kitchen atorvastatin (LIPITOR) 40 MG tablet Take 1 tablet (40 mg total) by mouth daily at 6 PM. 30 tablet 5  . clopidogrel (PLAVIX) 75 MG tablet Take 1 tablet (75 mg total) by mouth at bedtime. Resume in 1 week.    . isosorbide mononitrate (IMDUR) 30 MG 24 hr tablet TAKE 1 TABLET (30 MG TOTAL) BY MOUTH DAILY. 30 tablet 8  . metoprolol succinate (TOPROL-XL) 100 MG 24 hr tablet Take 100 mg by mouth daily. Take with or immediately following a meal.    . potassium chloride (K-DUR) 10 MEQ tablet Take 1 tablet (10 mEq total) by mouth daily. 90 tablet 3  . Tamsulosin HCl (FLOMAX) 0.4 MG CAPS Take 0.4 mg by mouth daily after supper.     Marland Kitchen  amLODipine (NORVASC) 5 MG tablet Take 5 mg by mouth daily.    . nitroGLYCERIN (NITROSTAT) 0.4 MG SL tablet Place 1 tablet (0.4 mg total) under the tongue every 5 (five) minutes as needed. For chest pain (Patient not taking: Reported on 10/27/2015) 20 tablet 12   No current facility-administered medications for this visit.    Allergies:   Pravachol; Simvastatin; Other; Oxycodone; and Terazosin    Social History:  The patient  reports that he quit smoking about 5 months ago. His smoking use included Cigarettes. He has a 34 pack-year smoking history. He has never used smokeless tobacco. He reports that he does not drink alcohol or use illicit drugs.   Family History:  The patient's family history includes Cancer (age of onset: 72) in his brother; Hyperlipidemia in his brother; Hypertension in his brother; Lung disease in his father; Prostate cancer in his brother.    ROS:  Please see the history of present illness.   Otherwise, review of systems are positive for none.   All other systems are reviewed and negative.    PHYSICAL EXAM: VS:  BP 146/70 mmHg  Pulse 84  Ht 5\' 8"  (1.727 m)  Wt 145 lb 12.8 oz (66.134 kg)  BMI 22.17 kg/m2  SpO2 99% , BMI Body mass index is 22.17 kg/(m^2). GEN: Well nourished, well developed, in no acute distress HEENT: normal Neck: no JVD, carotid bruits, or masses Cardiac: RRR; 2/6 systolic murmur, rubs, or gallops, mild right LE edema, healing scar in the right groin with minimal hematoma, no bleeding  Respiratory:  clear to auscultation bilaterally, normal work of breathing GI: soft, nontender, nondistended, + BS MS: no deformity or atrophy Skin: warm and dry, no rash Neuro:  Strength and sensation are intact Psych: euthymic mood, full affect   Recent Labs: 11/19/2014: Pro B Natriuretic peptide (BNP) 4549.0* 05/13/2015: Magnesium 2.2 07/02/2015: ALT 17 07/23/2015: BUN 19; Creatinine, Ser 1.35; Hemoglobin 11.6*; Platelets 122.0*; Potassium 3.2*; Sodium 144     Lipid Panel    Component Value Date/Time   CHOL 101 07/06/2011 0635   TRIG 61 07/06/2011 0635   HDL 39* 07/06/2011 0635   CHOLHDL 2.6 07/06/2011 0635   VLDL 12 07/06/2011  [id number]   LDLCALC 50 07/06/2011 0635      Wt Readings from Last 3 Encounters:  10/27/15 145 lb 12.8 oz (66.134 kg)  07/23/15 141 lb (63.957 kg)  07/15/15 138 lb (62.596 kg)      ASSESSMENT AND PLAN:  81 year old male   1. CAD, NSTEMI on 12/30/2013 - in the settings of acute URI, maximum troponin 5, Positive Lexiscan nuclear stress test with mid and basal inferolateral scar with periinfarct ischemia, moderate LV dysfunction 40%, cardiac cath on 01/17/14 showed severe disease in body of SVG to OM, s/p successful PTCA/bare metal stent x 1 mid body of SVG to OM. ASA and Plavix for at least one month but longer if he tolerates well. Continue metoprolol, atorvastatin.  He is asymptomatic.  2. Acute on chronic systolic CHF - LVEF 35%, he has mild LE edema, we will start lasix 20 mg po daily.    3.Chronic CKD - baseline Crea 1.3 -1.5, the last in the hospital 1.4, we will recheck in 3 months  4. S/P PPM for symptomatic bradycardia - followed by Dr Johney FrameAllred, working well  5. Hypertension - uncontrolled, we will refill amlodipine.   6. Orthostatic hypotension - as above   7. PAD - S/P right s/p right external iliac, common femoral and profundus endarterectomy with dacron patch angioplasty on 7/201/6 with groin hematoma - no bruit, resolving hematoma, we will check Hb today.  Follow up in 3 months, CMP, lipids at the next visit.  Signed, Lars MassonNELSON, Kruz Chiu H, MD  10/27/2015 11:54 AM    [facility] [address], [place], [place]  [address] Phone: [phone]) [phone]; Fax: [phone]

## 2015-11-19 ENCOUNTER — Encounter: Payer: Self-pay | Admitting: Vascular Surgery

## 2015-11-25 ENCOUNTER — Encounter: Payer: Self-pay | Admitting: Vascular Surgery

## 2015-11-25 ENCOUNTER — Ambulatory Visit (HOSPITAL_COMMUNITY)
Admission: RE | Admit: 2015-11-25 | Discharge: 2015-11-25 | Disposition: A | Discharge status: Home / Self Care | Payer: Medicare Other | Source: Ambulatory Visit | Attending: Vascular Surgery | Admitting: Vascular Surgery

## 2015-11-25 ENCOUNTER — Ambulatory Visit (INDEPENDENT_AMBULATORY_CARE_PROVIDER_SITE_OTHER): Payer: Medicare Other | Admitting: Vascular Surgery

## 2015-11-25 VITALS — BP 156/79 | HR 79 | Temp 97.7°F | Resp 16 | Ht 69.0 in | Wt 143.0 lb

## 2015-11-25 DIAGNOSIS — I739 Peripheral vascular disease, unspecified: Secondary | ICD-10-CM

## 2015-11-25 DIAGNOSIS — I255 Ischemic cardiomyopathy: Secondary | ICD-10-CM | POA: Diagnosis not present

## 2015-11-25 NOTE — Progress Notes (Signed)
Patient name: [name] MRN: [id number] DOB: [birth year] Sex: male  REASON FOR VISIT:  Follow-up of lower extremity arterial insufficiency  HPI: [name] is a 81 y.o. male  Status post right external iliac, common femoral and fundus endarterectomy on 06/25/2015. He developed rest pain in his right foot. He looks quite good today. He denies any claudication type symptoms. He does report an unusual sensation of discomfort in his anterior thighs when he first arises and even just with sneezing or other sudden movements. No calf claudication bilaterally. 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  Current Outpatient Prescriptions  Medication Sig Dispense Refill  . amLODipine (NORVASC) 5 MG tablet Take 1 tablet (5 mg total) by mouth daily. 30 tablet 1  . aspirin 81 MG tablet Take 81 mg by mouth daily.    . clopidogrel (PLAVIX) 75 MG tablet Take 1 tablet (75 mg total) by mouth at bedtime. Resume in 1 week.    . isosorbide mononitrate (IMDUR) 30 MG 24 hr tablet TAKE 1 TABLET (30 MG TOTAL) BY MOUTH DAILY. 30 tablet 8  . metoprolol succinate (TOPROL-XL) 100 MG 24 hr tablet Take 100 mg by mouth daily. Take with or immediately following a meal.    . potassium chloride (K-DUR) 10 MEQ tablet Take 1 tablet (10 mEq total) by mouth daily. 90 tablet 3  . Tamsulosin HCl (FLOMAX) 0.4 MG CAPS Take 0.4 mg by mouth daily after supper.     Marland Kitchen acetaminophen (TYLENOL) 325 MG tablet Take 650 mg by mouth every 6 (six) hours as needed. Reported on 11/25/2015    . atorvastatin (LIPITOR) 40 MG tablet Take 1 tablet (40 mg total) by mouth daily at 6 PM. (Patient not taking: Reported on 11/25/2015) 30 tablet 5  . furosemide (LASIX) 20 MG tablet Take 1 tablet (20 mg total) by mouth daily. (Patient not taking: Reported on 11/25/2015) 30 tablet 3  . nitroGLYCERIN (NITROSTAT) 0.4 MG SL tablet Place 1 tablet (0.4 mg total) under the tongue every 5  (five) minutes as needed. For chest pain (Patient not taking: Reported on 11/25/2015) 25 tablet 3   No current facility-administered medications for this visit.    REVIEW OF SYSTEMS:   denotes positive finding,  denotes negative finding Cardiac  Comments:  Chest pain or chest pressure:    Shortness of breath upon exertion:    Short of breath when lying flat:    Irregular heart rhythm:    Constitutional    Fever or chills:      PHYSICAL EXAM: Filed Vitals:   11/25/15 1410 11/25/15 1421  BP: 173/98 156/79  Pulse: 74 79  Temp: 97.7 F (36.5 C)   TempSrc: Oral   Resp: 16   Height:  (1.753 m)   Weight: 143 lb (64.864 kg)   SpO2: 100%     GENERAL: The patient is a well-nourished male, in no acute distress. The vital signs are documented above. CARDIOVASCULAR:  Palpable femoral pulses bilaterally PULMONARY: There is good air exchange  right groin completely healed with no evidence of tissue breakdown.   Noninvasive studies today reveal ankle arm index of 0.62 on the right and 0.63 on the left.  MEDICAL ISSUES:  stable moderate lower from the arterial insufficiency with no symptoms related to this. Patient continue his usual walking programs will see [place] again in 6 months with repeat ankle arm indices.  Gretta BeganEarly, [name] [place] Beeper: [phone]

## 2015-11-25 NOTE — Progress Notes (Signed)
Filed Vitals:   11/25/15 1410 11/25/15 1421  BP: 173/98 156/79  Pulse: 74 79  Temp: 97.7 F (36.5 C)   TempSrc: Oral   Resp: 16   Height: 5\' 9"  (1.753 m)   Weight: 143 lb (64.864 kg)   SpO2: 100%

## 2015-11-26 NOTE — Addendum Note (Signed)
Addended by: Adria DillELDRIDGE-LEWIS, Akeem Heppler L on: 11/26/2015 01:53 PM   Modules accepted: Orders

## 2015-12-15 ENCOUNTER — Other Ambulatory Visit: Payer: Self-pay | Admitting: Cardiology

## 2015-12-29 ENCOUNTER — Encounter: Payer: Self-pay | Admitting: Internal Medicine

## 2015-12-29 NOTE — Progress Notes (Signed)
Patient ID: [name], male   DOB: 01/10/1927, 82 y.o.   MRN: [id number]    HISTORY AND PHYSICAL   DATE: 07/01/15  Location:  [facility] Service: SNF [id number])   Extended Emergency Contact Information Primary Emergency Contact: Cupp,Gregory  [place] Phone: [phone] Relation: Son Secondary Emergency Contact: Knipp,Marva Address: [address]          [place], [place] [address] [place] [employer] Phone: [phone] Relation: Daughter  Advanced Directive information  FULL CODE  Chief Complaint  Patient presents with  . New Admit To SNF    HPI:   82 yo male seen today for readmission into SNF following hospital stay for right iliac artery occlusion, CAD with angina, known PAD, tobacco abuse, pacer status due to SSS, CKD stage 3, COPD, moderate malnutrition, known AAA, chronic systolic HF. He underwent endarterectomy of right iliac artery occlusion and is s/p external iliac, common femoral and profundus endarterctomy with Dacron patch angioplasty. ABIs 0.4 in RLE post op. H/H 8.9/26.6 at d/c. Cr trended up 1.4 --> 1.8  He c/o pain in right groin. No other concerns. No nursing issues. No falls  PAD - stable on plavix   CAD - stable on asa and imdur. No CP but does have prn SLNTG on hand  Hypertension - BP controlled on norvasc and toprol  Dyslipidemia - stable on lipitor. No myalgias  5. BPH - sx's stable on flomax  Depression - mood stable on sertraline  Moderate malnutrition - albumin 2.5. He has nutritional supplements TID  Past Medical History  Diagnosis Date  . Hypertension   . CAD (coronary artery disease)     a. s/p MI in 1988;  b. s/p CABG in 2000 (Dr. Ricard Dillon) x 4, LIMA to LAD, SVG to diagonal, SVG to [facility], SVG to PDA;  c. 12/2013 NSTEMI/abnl MV;  d. 01/2014 Cath/PCI: native 3VD, VG->RCA 100, VG->OM aneurysmal 40ost/m, 14m5.0x12 Veriflex BMS), VG->Diag aneurysmal, LIMA->LAD nl.  . TIA (transient  ischemic attack)   . Hyperlipidemia   . COPD (chronic obstructive pulmonary disease) ([facility]   . CVA (cerebral infarction) 07/2011    Remote left brain infarct with sensory deficits and had a left internal carotid stent placed by Dr. DEstanislado Pandyat that time, no recurrent CVA or TIAs or CNS events.  .Marland KitchenPAD (peripheral artery disease) (HCC)     Stable. Had a past RFPBG by Dr. EDonnetta Hutchingin 2009. this graft is occluded with reconstitution in the popliteal artery, which was patent. Right tibial was occluded and 2-vessel runoff on angiography done by Dr. BTrula Sladein 12/2010.  . Sick sinus syndrome (HCC)     PPM implanted for this and syncope   . Chronic systolic CHF (congestive heart failure) ([facility]     Hospitalization for CHF from 05/04/13-05/06/13. Treated medically. Had possible pneumonia with short course of antibiotics.  . Abdominal bruit     Loud  . AAA (abdominal aortic aneurysm) ([facility]     Has a known AAA of 3.2 x 3.4 cm by abdominal untrasound 03/27/12. Followup abdominal ultrasound on 03/29/13 showed a diameter of 3.6 cm, which is stable  . Peripheral vascular disease ([facility]   . Hypothyroid     On supplement "don't know if I take RX or not for my thyroid" (01/16/2014)  . Stroke ([facility] [facility]   . Syncope     tilt table test/8.10.01/orthostatic hypotension  . Orthostatic hypotension   . Presence of permanent  cardiac pacemaker   . Pneumonia   . Headache     Past Surgical History  Procedure Laterality Date  . Femoral bypass Right ?2001  . Carotid stent Left 06/23/11    By Dr. Trula Slade  . Coronary artery bypass graft  2000    1st in 2000 (Dr. Ricard Dillon) x 4, LIMA to LAD, SVG to diagonal, SVG to OM4, SVG to PDA. Re-cath in May 2012 with patent LIMA to LAD, patent SVG to diagonal, patent SVG to OM with left-to-right collaterals to an occluded right.  . Coronary angioplasty with stent placement  2001-01/16/2014    "think today makes #4" (01/16/2014)  . Cardiac catheterization  2000  . Inguinal hernia repair Right  1980's  . Insert / replace / remove pacemaker      implanted 2003 by Dr Tami Ribas with gen change (MDT [id number]) 08/2010 by [place]  . Cataract extraction w/ intraocular lens  implant, bilateral Bilateral   . Abdominal aortagram N/A 02/22/2012    Procedure: ABDOMINAL AORTAGRAM;  Surgeon: Serafina Mitchell, MD;  Location: [facility] CATH LAB;  Service: Cardiovascular;  Laterality: N/A;  . Carotid angiogram Bilateral 02/22/2012    Procedure: CAROTID ANGIOGRAM;  Surgeon: Serafina Mitchell, MD;  Location: [facility] CATH LAB;  Service: Cardiovascular;  Laterality: Bilateral;  . Left heart catheterization with coronary/graft angiogram N/A 01/16/2014    Procedure: LEFT HEART CATHETERIZATION WITH Beatrix Fetters;  Surgeon: Burnell Blanks, MD;  Location: [facility] CATH LAB;  Service: Cardiovascular;  Laterality: N/A;  . Abdominal aortagram    . Colonoscopy    . Peripheral vascular catheterization N/A 06/24/2015    Procedure: Abdominal Aortogram;  Surgeon: Serafina Mitchell, MD;  Location: [place] CV LAB;  Service: Cardiovascular;  Laterality: N/A;  . Endarterectomy femoral Right 06/25/2015    Procedure: ENDARTERECTOMY RIGHT FEMORAL ARTERY ;  Surgeon: Rosetta Posner, MD;  Location: [facility] OR;  Service: Vascular;  Laterality: Right;  . Patch angioplasty Right 06/25/2015    Procedure: RIGHT COMMON FEMORAL ARTERY AND PROFUNDA PATCH ANGIOPLASTY USING HEMASHIELD PLATINUM FINESSE PATCH ;  Surgeon: Rosetta Posner, MD;  Location: [facility];  Service: Vascular;  Laterality: Right;    Patient Care Team: Stephens Shire, MD as PCP - General (Family Medicine) Terance Ice, MD as Attending Physician (Cardiology)  Social History   Social History  . Marital Status: Widowed    Spouse Name: N/A  . Number of Children: 4  . Years of Education: N/A   Occupational History  . retired     Holiday representative, [place] [employer] History Main Topics  . Smoking status: Former Smoker -- 0.50 packs/day for 68 years    Types: Cigarettes    Quit date:  05/02/2015  . Smokeless tobacco: Never Used  . Alcohol Use: No  . Drug Use: No  . Sexual Activity: Yes   Other Topics Concern  . Not on file   Social History Narrative   Lives in [place], spouse is now deceased.  Rece[facility] care at the [facility].   4 children, 6 grandchildren     reports that he quit smoking about 7 months ago. His smoking use included Cigarettes. He has a 34 pack-year smoking history. He has never used smokeless tobacco. He reports that he does not drink alcohol or use illicit drugs.  Family History  Problem Relation Age of Onset  . Cancer Brother 60    oral   . Prostate cancer Brother   . Hypertension Brother   .  Hyperlipidemia Brother   . Lung disease Father     black lung disease   Family Status  Relation Status Death Age  . Brother Deceased   . Brother Alive   . Brother Deceased   . Father Deceased 91    black lung disease  . Mother Deceased 43  . Sister Alive   . Sister Alive   . Sister Alive   . Sister Alive   . Sister Alive     There is no immunization history for the selected administration types on file for this patient.  Allergies  Allergen Reactions  . Pravachol Other (See Comments)    myalgias  . Simvastatin Other (See Comments)    myalgia  . Other Other (See Comments)    GREEN BEANS AND ONIONS  . Oxycodone Other (See Comments)    Family member reports "he was highly agitated and disoriented"   . Terazosin Other (See Comments)    unknown    Medications: Patient's Medications  New Prescriptions   No medications on file  Previous Medications   ACETAMINOPHEN (TYLENOL) 325 MG TABLET    Take 650 mg by mouth every 6 (six) hours as needed. Reported on 11/25/2015   AMLODIPINE (NORVASC) 5 MG TABLET    TAKE 1 TABLET (5 MG TOTAL) BY MOUTH DAILY.   ASPIRIN 81 MG TABLET    Take 81 mg by mouth daily.   ATORVASTATIN (LIPITOR) 40 MG TABLET    Take 1 tablet (40 mg total) by mouth daily at 6 PM.   CLOPIDOGREL (PLAVIX) 75 MG TABLET     Take 1 tablet (75 mg total) by mouth at bedtime. Resume in 1 week.   FUROSEMIDE (LASIX) 20 MG TABLET    Take 1 tablet (20 mg total) by mouth daily.   ISOSORBIDE MONONITRATE (IMDUR) 30 MG 24 HR TABLET    TAKE 1 TABLET (30 MG TOTAL) BY MOUTH DAILY.   METOPROLOL SUCCINATE (TOPROL-XL) 100 MG 24 HR TABLET    Take 100 mg by mouth daily. Take with or immediately following a meal.   NITROGLYCERIN (NITROSTAT) 0.4 MG SL TABLET    Place 1 tablet (0.4 mg total) under the tongue every 5 (five) minutes as needed. For chest pain   POTASSIUM CHLORIDE (K-DUR) 10 MEQ TABLET    Take 1 tablet (10 mEq total) by mouth daily.   TAMSULOSIN HCL (FLOMAX) 0.4 MG CAPS    Take 0.4 mg by mouth daily after supper.   Modified Medications   No medications on file  Discontinued Medications   No medications on file    Review of Systems  Constitutional: Negative for fever and chills.  Musculoskeletal: Positive for arthralgias and gait problem.  Skin: Negative for wound.  Neurological: Negative for weakness and numbness.  All other systems reviewed and are negative.   Filed Vitals:   07/01/15 2149  BP: 134/62  Pulse: 80  Temp: 97.9 F (36.6 C)  Weight: 140 lb (63.504 kg)  SpO2: 98%   Body mass index is 20.67 kg/(m^2).  Physical Exam  Constitutional: He is oriented to person, place, and time. He appears well-developed and well-nourished.  Sitting on bed in NAD. Frail appearing  HENT:  Mouth/Throat: Oropharynx is clear and moist.  Eyes: Pupils are equal, round, and reactive to light. No scleral icterus.  Neck: Neck supple. Carotid bruit is not present. No thyromegaly present.  Cardiovascular: Normal rate, regular rhythm, normal heart sounds and intact distal pulses.  Exam reveals no gallop and no  friction rub.   No murmur heard. Right inguinal bandage c/d/i. No distal LE swelling b/l. No calf TTP.   Pulmonary/Chest: Effort normal and breath sounds normal. He has no wheezes. He has no rales. He exhibits no  tenderness.  Abdominal: Soft. Bowel sounds are normal. He exhibits no distension, no abdominal bruit, no pulsatile midline mass and no mass. There is no tenderness. There is no rebound and no guarding.  Lymphadenopathy:    He has no cervical adenopathy.  Neurological: He is alert and oriented to person, place, and time. He has normal reflexes.  Skin: Skin is warm and dry. No rash noted.  Psychiatric: He has a normal mood and affect. His behavior is normal. Judgment and thought content normal.     Labs reviewed: Admission on 06/25/2015, Discharged on 06/28/2015  Component Date Value Ref Range Status  . Sodium 06/25/2015 142  135 - 145 mmol/L Final  . Potassium 06/25/2015 3.7  3.5 - 5.1 mmol/L Final  . Glucose, Bld 06/25/2015 125* 65 - 99 mg/dL Final  . HCT 06/25/2015 37.0* 39.0 - 52.0 % Final  . Hemoglobin 06/25/2015 12.6* 13.0 - 17.0 g/dL Final  . WBC 06/26/2015 10.3  4.0 - 10.5 K/uL Final  . RBC 06/26/2015 2.76* 4.22 - 5.81 MIL/uL Final  . Hemoglobin 06/26/2015 8.9* 13.0 - 17.0 g/dL Final   REPEATED TO VERIFY  . HCT 06/26/2015 26.6* 39.0 - 52.0 % Final  . MCV 06/26/2015 96.4  78.0 - 100.0 fL Final  . MCH 06/26/2015 32.2  26.0 - 34.0 pg Final  . MCHC 06/26/2015 33.5  30.0 - 36.0 g/dL Final  . RDW 06/26/2015 15.6* 11.5 - 15.5 % Final  . Platelets 06/26/2015 110* 150 - 400 K/uL Final   PLATELET COUNT CONFIRMED BY SMEAR  . Sodium 06/26/2015 143  135 - 145 mmol/L Final  . Potassium 06/26/2015 3.6  3.5 - 5.1 mmol/L Final  . Chloride 06/26/2015 109  101 - 111 mmol/L Final  . CO2 06/26/2015 27  22 - 32 mmol/L Final  . Glucose, Bld 06/26/2015 118* 65 - 99 mg/dL Final  . BUN 06/26/2015 19  6 - 20 mg/dL Final  . Creatinine, Ser 06/26/2015 1.81* 0.61 - 1.24 mg/dL Final  . Calcium 06/26/2015 7.8* 8.9 - 10.3 mg/dL Final  . GFR calc non Af Amer 06/26/2015 32* >60 mL/min Final  . GFR calc Af Amer 06/26/2015 37* >60 mL/min Final   Comment: (NOTE) The eGFR has been calculated using the CKD EPI  equation. This calculation has not been validated in all clinical situations. eGFR's persistently <60 mL/min signify possible Chronic Kidney Disease.   . Anion gap 06/26/2015 7  5 - 15 Final  . WBC 06/25/2015 15.4* 4.0 - 10.5 K/uL Final  . RBC 06/25/2015 3.67* 4.22 - 5.81 MIL/uL Final  . Hemoglobin 06/25/2015 11.6* 13.0 - 17.0 g/dL Final  . HCT 06/25/2015 34.8* 39.0 - 52.0 % Final  . MCV 06/25/2015 94.8  78.0 - 100.0 fL Final  . MCH 06/25/2015 31.6  26.0 - 34.0 pg Final  . MCHC 06/25/2015 33.3  30.0 - 36.0 g/dL Final  . RDW 06/25/2015 15.2  11.5 - 15.5 % Final  . Platelets 06/25/2015 122* 150 - 400 K/uL Final  . Creatinine, Ser 06/25/2015 1.61* 0.61 - 1.24 mg/dL Final  . GFR calc non Af Amer 06/25/2015 37* >60 mL/min Final  . GFR calc Af Amer 06/25/2015 43* >60 mL/min Final   Comment: (NOTE) The eGFR has been calculated using the  CKD EPI equation. This calculation has not been validated in all clinical situations. eGFR's persistently <60 mL/min signify possible Chronic Kidney Disease.   Admission on 06/24/2015, Discharged on 06/24/2015  Component Date Value Ref Range Status  . Sodium 06/24/2015 141  135 - 145 mmol/L Final  . Potassium 06/24/2015 3.2* 3.5 - 5.1 mmol/L Final  . Chloride 06/24/2015 104  101 - 111 mmol/L Final  . BUN 06/24/2015 21* 6 - 20 mg/dL Final  . Creatinine, Ser 06/24/2015 1.50* 0.61 - 1.24 mg/dL Final  . Glucose, Bld 06/24/2015 89  65 - 99 mg/dL Final  . Calcium, Ion 06/24/2015 1.14  1.13 - 1.30 mmol/L Final  . TCO2 06/24/2015 24  0 - 100 mmol/L Final  . Hemoglobin 06/24/2015 15.3  13.0 - 17.0 g/dL Final  . HCT 06/24/2015 45.0  39.0 - 52.0 % Final  . MRSA, PCR 06/24/2015 NEGATIVE  NEGATIVE Final  . Staphylococcus aureus 06/24/2015 NEGATIVE  NEGATIVE Final   Comment:        The Xpert SA Assay (FDA approved for NASAL specimens in patients over 80 years of age), is one component of a comprehensive surveillance program.  Test performance has been  validated by [facility] for patients greater than or equal to 23 year old. It is not intended to diagnose infection nor to guide or monitor treatment.   Marland Kitchen aPTT 06/24/2015 33  24 - 37 seconds Final  . WBC 06/24/2015 9.0  4.0 - 10.5 K/uL Final  . RBC 06/24/2015 4.61  4.22 - 5.81 MIL/uL Final  . Hemoglobin 06/24/2015 14.7  13.0 - 17.0 g/dL Final  . HCT 06/24/2015 43.2  39.0 - 52.0 % Final  . MCV 06/24/2015 93.7  78.0 - 100.0 fL Final  . MCH 06/24/2015 31.9  26.0 - 34.0 pg Final  . MCHC 06/24/2015 34.0  30.0 - 36.0 g/dL Final  . RDW 06/24/2015 15.1  11.5 - 15.5 % Final  . Platelets 06/24/2015 141* 150 - 400 K/uL Final  . Sodium 06/24/2015 142  135 - 145 mmol/L Final  . Potassium 06/24/2015 3.4* 3.5 - 5.1 mmol/L Final  . Chloride 06/24/2015 105  101 - 111 mmol/L Final  . CO2 06/24/2015 30  22 - 32 mmol/L Final  . Glucose, Bld 06/24/2015 114* 65 - 99 mg/dL Final  . BUN 06/24/2015 14  6 - 20 mg/dL Final  . Creatinine, Ser 06/24/2015 1.45* 0.61 - 1.24 mg/dL Final  . Calcium 06/24/2015 8.7* 8.9 - 10.3 mg/dL Final  . Total Protein 06/24/2015 6.1* 6.5 - 8.1 g/dL Final  . Albumin 06/24/2015 3.1* 3.5 - 5.0 g/dL Final  . AST 06/24/2015 16  15 - 41 U/L Final  . ALT 06/24/2015 10* 17 - 63 U/L Final  . Alkaline Phosphatase 06/24/2015 88  38 - 126 U/L Final  . Total Bilirubin 06/24/2015 0.4  0.3 - 1.2 mg/dL Final  . GFR calc non Af Amer 06/24/2015 42* >60 mL/min Final  . GFR calc Af Amer 06/24/2015 49* >60 mL/min Final   Comment: (NOTE) The eGFR has been calculated using the CKD EPI equation. This calculation has not been validated in all clinical situations. eGFR's persistently <60 mL/min signify possible Chronic Kidney Disease.   . Anion gap 06/24/2015 7  5 - 15 Final  . Prothrombin Time 06/24/2015 15.1  11.6 - 15.2 seconds Final  . INR 06/24/2015 1.18  0.00 - 1.49 Final  . ABO/RH(D) 06/24/2015 O POS   Final  . Antibody Screen 06/24/2015 NEG  Final  . Sample Expiration 06/24/2015  07/02/2015   Final  . Color, Urine 06/24/2015 YELLOW  YELLOW Final  . APPearance 06/24/2015 CLEAR  CLEAR Final  . Specific Gravity, Urine 06/24/2015 1.025  1.005 - 1.030 Final  . pH 06/24/2015 7.5  5.0 - 8.0 Final  . Glucose, UA 06/24/2015 NEGATIVE  NEGATIVE mg/dL Final  . Hgb urine dipstick 06/24/2015 MODERATE* NEGATIVE Final  . Bilirubin Urine 06/24/2015 NEGATIVE  NEGATIVE Final  . Ketones, ur 06/24/2015 NEGATIVE  NEGATIVE mg/dL Final  . Protein, ur 06/24/2015 NEGATIVE  NEGATIVE mg/dL Final  . Urobilinogen, UA 06/24/2015 0.2  0.0 - 1.0 mg/dL Final  . Nitrite 06/24/2015 NEGATIVE  NEGATIVE Final  . Leukocytes, UA 06/24/2015 NEGATIVE  NEGATIVE Final  . WBC, UA 06/24/2015 0-2  <3 WBC/hpf Final  . RBC / HPF 06/24/2015 21-50  <3 RBC/hpf Final  . Bacteria, UA 06/24/2015 RARE  RARE Final  Appointment on 05/22/2015  Component Date Value Ref Range Status  . Nuc Stress EF 05/22/2015 32   Final  . LV Systolic Volume 38/17/7116 110   Final  . TID 05/22/2015 1.01   Final  . LV Diastolic Volume 57/90/3833 161   Final  . LHR 05/22/2015 0.27   Final  . SSS 05/22/2015 11   Final  . SRS 05/22/2015 8   Final  . SDS 05/22/2015 3   Final  . Rest HR 05/22/2015 80   Final  . Rest BP 05/22/2015 159/85   Final  . Peak HR 05/22/2015 80   Final  . Peak BP 05/22/2015 159/85   Final  Admission on 05/13/2015, Discharged on 05/14/2015  Component Date Value Ref Range Status  . Troponin i, poc 05/13/2015 0.17* 0.00 - 0.08 ng/mL Final  . Comment 05/13/2015 NOTIFIED PHYSICIAN   Final  . Comment 3 05/13/2015          Final   Comment: Due to the release kinetics of cTnI, a negative result within the first hours of the onset of symptoms does not rule out myocardial infarction with certainty. If myocardial infarction is still suspected, repeat the test at appropriate intervals.   . Magnesium 05/13/2015 2.2  1.7 - 2.4 mg/dL Final  . Troponin I 05/13/2015 0.17* <0.031 ng/mL Final   Comment:        PERSISTENTLY  INCREASED TROPONIN VALUES IN THE RANGE OF 0.04-0.49 ng/mL CAN BE SEEN IN:       -UNSTABLE ANGINA       -CONGESTIVE HEART FAILURE       -MYOCARDITIS       -CHEST TRAUMA       -ARRYHTHMIAS       -LATE PRESENTING MYOCARDIAL INFARCTION       -COPD   CLINICAL FOLLOW-UP RECOMMENDED.   . WBC 05/13/2015 11.0* 4.0 - 10.5 K/uL Final  . RBC 05/13/2015 4.77  4.22 - 5.81 MIL/uL Final  . Hemoglobin 05/13/2015 15.5  13.0 - 17.0 g/dL Final  . HCT 05/13/2015 45.1  39.0 - 52.0 % Final  . MCV 05/13/2015 94.5  78.0 - 100.0 fL Final  . MCH 05/13/2015 32.5  26.0 - 34.0 pg Final  . MCHC 05/13/2015 34.4  30.0 - 36.0 g/dL Final  . RDW 05/13/2015 15.3  11.5 - 15.5 % Final  . Platelets 05/13/2015 145* 150 - 400 K/uL Final  . Neutrophils Relative % 05/13/2015 77  43 - 77 % Final  . Neutro Abs 05/13/2015 8.4* 1.7 - 7.7 K/uL Final  . Lymphocytes Relative 05/13/2015  19  12 - 46 % Final  . Lymphs Abs 05/13/2015 2.1  0.7 - 4.0 K/uL Final  . Monocytes Relative 05/13/2015 3  3 - 12 % Final  . Monocytes Absolute 05/13/2015 0.4  0.1 - 1.0 K/uL Final  . Eosinophils Relative 05/13/2015 1  0 - 5 % Final  . Eosinophils Absolute 05/13/2015 0.1  0.0 - 0.7 K/uL Final  . Basophils Relative 05/13/2015 0  0 - 1 % Final  . Basophils Absolute 05/13/2015 0.0  0.0 - 0.1 K/uL Final  . Sodium 05/13/2015 140  135 - 145 mmol/L Final  . Potassium 05/13/2015 3.4* 3.5 - 5.1 mmol/L Final  . Chloride 05/13/2015 103  101 - 111 mmol/L Final  . CO2 05/13/2015 24  22 - 32 mmol/L Final  . Glucose, Bld 05/13/2015 102* 65 - 99 mg/dL Final  . BUN 05/13/2015 15  6 - 20 mg/dL Final  . Creatinine, Ser 05/13/2015 1.42* 0.61 - 1.24 mg/dL Final  . Calcium 05/13/2015 8.7* 8.9 - 10.3 mg/dL Final  . Total Protein 05/13/2015 7.1  6.5 - 8.1 g/dL Final  . Albumin 05/13/2015 3.5  3.5 - 5.0 g/dL Final  . AST 05/13/2015 21  15 - 41 U/L Final  . ALT 05/13/2015 13* 17 - 63 U/L Final  . Alkaline Phosphatase 05/13/2015 103  38 - 126 U/L Final  . Total  Bilirubin 05/13/2015 0.4  0.3 - 1.2 mg/dL Final  . GFR calc non Af Amer 05/13/2015 43* >60 mL/min Final  . GFR calc Af Amer 05/13/2015 50* >60 mL/min Final   Comment: (NOTE) The eGFR has been calculated using the CKD EPI equation. This calculation has not been validated in all clinical situations. eGFR's persistently <60 mL/min signify possible Chronic Kidney Disease.   . Anion gap 05/13/2015 13  5 - 15 Final  . Prothrombin Time 05/13/2015 14.5  11.6 - 15.2 seconds Final  . INR 05/13/2015 1.11  0.00 - 1.49 Final  . Color, Urine 05/13/2015 YELLOW  YELLOW Final  . APPearance 05/13/2015 CLEAR  CLEAR Final  . Specific Gravity, Urine 05/13/2015 1.018  1.005 - 1.030 Final  . pH 05/13/2015 6.5  5.0 - 8.0 Final  . Glucose, UA 05/13/2015 NEGATIVE  NEGATIVE mg/dL Final  . Hgb urine dipstick 05/13/2015 MODERATE* NEGATIVE Final  . Bilirubin Urine 05/13/2015 NEGATIVE  NEGATIVE Final  . Ketones, ur 05/13/2015 NEGATIVE  NEGATIVE mg/dL Final  . Protein, ur 05/13/2015 30* NEGATIVE mg/dL Final  . Urobilinogen, UA 05/13/2015 0.2  0.0 - 1.0 mg/dL Final  . Nitrite 05/13/2015 NEGATIVE  NEGATIVE Final  . Leukocytes, UA 05/13/2015 NEGATIVE  NEGATIVE Final  . D-Dimer, Quant 05/13/2015 >20.00* 0.00 - 0.48 ug/mL-FEU Final   Comment:        AT THE INHOUSE ESTABLISHED CUTOFF VALUE OF 0.48 ug/mL FEU, THIS ASSAY HAS BEEN DOCUMENTED IN THE LITERATURE TO HAVE A SENSITIVITY AND NEGATIVE PREDICTIVE VALUE OF AT LEAST 98 TO 99%.  THE TEST RESULT SHOULD BE CORRELATED WITH AN ASSESSMENT OF THE CLINICAL PROBABILITY OF DVT / VTE.   Marland Kitchen Troponin i, poc 05/13/2015 0.14* 0.00 - 0.08 ng/mL Final  . Comment 05/13/2015 NOTIFIED PHYSICIAN   Final  . Comment 3 05/13/2015          Final   Comment: Due to the release kinetics of cTnI, a negative result within the first hours of the onset of symptoms does not rule out myocardial infarction with certainty. If myocardial infarction is still suspected, repeat the test at  appropriate intervals.   . Squamous Epithelial / LPF 05/13/2015 RARE  RARE Final  . WBC, UA 05/13/2015 0-2  <3 WBC/hpf Final  . RBC / HPF 05/13/2015 21-50  <3 RBC/hpf Final  . Bacteria, UA 05/13/2015 RARE  RARE Final  . Casts 05/13/2015 GRANULAR CAST* NEGATIVE Final  . Urine-Other 05/13/2015 MUCOUS PRESENT   Final  . Troponin I 05/13/2015 0.18* <0.031 ng/mL Final   Comment:        PERSISTENTLY INCREASED TROPONIN VALUES IN THE RANGE OF 0.04-0.49 ng/mL CAN BE SEEN IN:       -UNSTABLE ANGINA       -CONGESTIVE HEART FAILURE       -MYOCARDITIS       -CHEST TRAUMA       -ARRYHTHMIAS       -LATE PRESENTING MYOCARDIAL INFARCTION       -COPD   CLINICAL FOLLOW-UP RECOMMENDED.   Marland Kitchen Troponin I 05/14/2015 0.16* <0.031 ng/mL Final   Comment:        PERSISTENTLY INCREASED TROPONIN VALUES IN THE RANGE OF 0.04-0.49 ng/mL CAN BE SEEN IN:       -UNSTABLE ANGINA       -CONGESTIVE HEART FAILURE       -MYOCARDITIS       -CHEST TRAUMA       -ARRYHTHMIAS       -LATE PRESENTING MYOCARDIAL INFARCTION       -COPD   CLINICAL FOLLOW-UP RECOMMENDED.   Marland Kitchen Troponin I 05/14/2015 0.16* <0.031 ng/mL Final   Comment:        PERSISTENTLY INCREASED TROPONIN VALUES IN THE RANGE OF 0.04-0.49 ng/mL CAN BE SEEN IN:       -UNSTABLE ANGINA       -CONGESTIVE HEART FAILURE       -MYOCARDITIS       -CHEST TRAUMA       -ARRYHTHMIAS       -LATE PRESENTING MYOCARDIAL INFARCTION       -COPD   CLINICAL FOLLOW-UP RECOMMENDED.   Marland Kitchen Sodium 05/14/2015 140  135 - 145 mmol/L Final  . Potassium 05/14/2015 3.4* 3.5 - 5.1 mmol/L Final  . Chloride 05/14/2015 106  101 - 111 mmol/L Final  . CO2 05/14/2015 27  22 - 32 mmol/L Final  . Glucose, Bld 05/14/2015 85  65 - 99 mg/dL Final  . BUN 05/14/2015 18  6 - 20 mg/dL Final  . Creatinine, Ser 05/14/2015 1.45* 0.61 - 1.24 mg/dL Final  . Calcium 05/14/2015 8.0* 8.9 - 10.3 mg/dL Final  . GFR calc non Af Amer 05/14/2015 42* >60 mL/min Final  . GFR calc Af Amer 05/14/2015 49* >60  mL/min Final   Comment: (NOTE) The eGFR has been calculated using the CKD EPI equation. This calculation has not been validated in all clinical situations. eGFR's persistently <60 mL/min signify possible Chronic Kidney Disease.   . Anion gap 05/14/2015 7  5 - 15 Final  . WBC 05/13/2015 10.0  4.0 - 10.5 K/uL Final  . RBC 05/13/2015 4.50  4.22 - 5.81 MIL/uL Final  . Hemoglobin 05/13/2015 14.5  13.0 - 17.0 g/dL Final  . HCT 05/13/2015 42.7  39.0 - 52.0 % Final  . MCV 05/13/2015 94.9  78.0 - 100.0 fL Final  . MCH 05/13/2015 32.2  26.0 - 34.0 pg Final  . MCHC 05/13/2015 34.0  30.0 - 36.0 g/dL Final  . RDW 05/13/2015 15.3  11.5 - 15.5 % Final  . Platelets 05/13/2015 144* 150 - 400 K/uL Final  .  Creatinine, Ser 05/13/2015 1.53* 0.61 - 1.24 mg/dL Final  . GFR calc non Af Amer 05/13/2015 39* >60 mL/min Final  . GFR calc Af Amer 05/13/2015 46* >60 mL/min Final   Comment: (NOTE) The eGFR has been calculated using the CKD EPI equation. This calculation has not been validated in all clinical situations. eGFR's persistently <60 mL/min signify possible Chronic Kidney Disease.     No results found.   Assessment/Plan    ICD-9-CM ICD-10-CM   1. PAD (peripheral artery disease) (HCC) 443.9 I73.9   2. H/O endarterectomy V15.29 Z98.89    due to right iliac artery occlusion; s/p external iliac, common femoral and profundus endarterctomy with Dacron patch angioplasty  3. Essential hypertension 401.9 I10   4. Hyperlipidemia 272.4 E78.5   5. Coronary artery disease involving native coronary artery of native heart with angina pectoris (HCC) 414.01 I25.119    413.9    6. CKD (chronic kidney disease) stage 3, GFR 30-59 ml/min 585.3 N18.3   7. History of CVA (cerebrovascular accident) V12.54 Z86.73   8. Cardiac pacemaker in situ V45.01 Z95.0    due SSS  9. Chronic combined systolic and diastolic CHF (congestive heart failure) (HCC) 428.42 I50.42    428.0    10. Tobacco abuse 305.1 Z72.0   11.  Malnutrition of moderate degree (HCC) 263.0 E44.0   12. Other emphysema ([place]) 492.8 J43.8     Stop scheduled Tylenol 332m   Start Tramadol 554mqAM and qHS. May take noon dose prn  Cont other meds as ordered  F/u with vascular sx as scheduled  PT/OT/ST as ordered  Cont nutritional supplement as ordered  Wound care as ordered  GOAL: short term rehab and d/c home when medically appropriate. Communicated with pt and nursing.  Will follow  Monica S. CaPerlie GoldPiRestpadd [facility] [address] [phone] (Monday-Friday 8 AM - 5 PM) ([phone] 5 PM and follow prompts

## 2016-01-28 ENCOUNTER — Emergency Department (HOSPITAL_COMMUNITY): Payer: Medicare Other

## 2016-01-28 ENCOUNTER — Inpatient Hospital Stay (HOSPITAL_COMMUNITY)
Admission: EM | Admit: 2016-01-28 | Discharge: 2016-02-01 | DRG: 690 | Disposition: A | Discharge status: Home Health | Payer: Medicare Other | Attending: Internal Medicine | Admitting: Internal Medicine

## 2016-01-28 DIAGNOSIS — Z9889 Other specified postprocedural states: Secondary | ICD-10-CM

## 2016-01-28 DIAGNOSIS — Z955 Presence of coronary angioplasty implant and graft: Secondary | ICD-10-CM

## 2016-01-28 DIAGNOSIS — Z91018 Allergy to other foods: Secondary | ICD-10-CM

## 2016-01-28 DIAGNOSIS — N12 Tubulo-interstitial nephritis, not specified as acute or chronic: Principal | ICD-10-CM | POA: Diagnosis present

## 2016-01-28 DIAGNOSIS — I712 Thoracic aortic aneurysm, without rupture: Secondary | ICD-10-CM | POA: Diagnosis present

## 2016-01-28 DIAGNOSIS — Z7982 Long term (current) use of aspirin: Secondary | ICD-10-CM

## 2016-01-28 DIAGNOSIS — R109 Unspecified abdominal pain: Secondary | ICD-10-CM | POA: Diagnosis not present

## 2016-01-28 DIAGNOSIS — Z950 Presence of cardiac pacemaker: Secondary | ICD-10-CM | POA: Diagnosis present

## 2016-01-28 DIAGNOSIS — Z885 Allergy status to narcotic agent status: Secondary | ICD-10-CM

## 2016-01-28 DIAGNOSIS — Z7902 Long term (current) use of antithrombotics/antiplatelets: Secondary | ICD-10-CM

## 2016-01-28 DIAGNOSIS — Z8042 Family history of malignant neoplasm of prostate: Secondary | ICD-10-CM

## 2016-01-28 DIAGNOSIS — Z888 Allergy status to other drugs, medicaments and biological substances status: Secondary | ICD-10-CM

## 2016-01-28 DIAGNOSIS — N183 Chronic kidney disease, stage 3 (moderate): Secondary | ICD-10-CM | POA: Diagnosis present

## 2016-01-28 DIAGNOSIS — I714 Abdominal aortic aneurysm, without rupture: Secondary | ICD-10-CM | POA: Diagnosis present

## 2016-01-28 DIAGNOSIS — I25810 Atherosclerosis of coronary artery bypass graft(s) without angina pectoris: Secondary | ICD-10-CM | POA: Diagnosis present

## 2016-01-28 DIAGNOSIS — Z87891 Personal history of nicotine dependence: Secondary | ICD-10-CM

## 2016-01-28 DIAGNOSIS — Z9841 Cataract extraction status, right eye: Secondary | ICD-10-CM

## 2016-01-28 DIAGNOSIS — I129 Hypertensive chronic kidney disease with stage 1 through stage 4 chronic kidney disease, or unspecified chronic kidney disease: Secondary | ICD-10-CM | POA: Diagnosis present

## 2016-01-28 DIAGNOSIS — I739 Peripheral vascular disease, unspecified: Secondary | ICD-10-CM | POA: Diagnosis present

## 2016-01-28 DIAGNOSIS — N400 Enlarged prostate without lower urinary tract symptoms: Secondary | ICD-10-CM | POA: Diagnosis present

## 2016-01-28 DIAGNOSIS — N179 Acute kidney failure, unspecified: Secondary | ICD-10-CM | POA: Diagnosis present

## 2016-01-28 DIAGNOSIS — Z79899 Other long term (current) drug therapy: Secondary | ICD-10-CM

## 2016-01-28 DIAGNOSIS — E039 Hypothyroidism, unspecified: Secondary | ICD-10-CM | POA: Diagnosis present

## 2016-01-28 DIAGNOSIS — M79604 Pain in right leg: Secondary | ICD-10-CM | POA: Diagnosis present

## 2016-01-28 DIAGNOSIS — I252 Old myocardial infarction: Secondary | ICD-10-CM

## 2016-01-28 DIAGNOSIS — Z9842 Cataract extraction status, left eye: Secondary | ICD-10-CM

## 2016-01-28 DIAGNOSIS — E860 Dehydration: Secondary | ICD-10-CM | POA: Diagnosis present

## 2016-01-28 DIAGNOSIS — I495 Sick sinus syndrome: Secondary | ICD-10-CM | POA: Diagnosis present

## 2016-01-28 DIAGNOSIS — J449 Chronic obstructive pulmonary disease, unspecified: Secondary | ICD-10-CM | POA: Diagnosis present

## 2016-01-28 DIAGNOSIS — I5042 Chronic combined systolic (congestive) and diastolic (congestive) heart failure: Secondary | ICD-10-CM | POA: Diagnosis present

## 2016-01-28 DIAGNOSIS — R110 Nausea: Secondary | ICD-10-CM | POA: Diagnosis present

## 2016-01-28 DIAGNOSIS — N390 Urinary tract infection, site not specified: Secondary | ICD-10-CM | POA: Diagnosis present

## 2016-01-28 DIAGNOSIS — I723 Aneurysm of iliac artery: Secondary | ICD-10-CM | POA: Diagnosis present

## 2016-01-28 DIAGNOSIS — Z961 Presence of intraocular lens: Secondary | ICD-10-CM | POA: Diagnosis present

## 2016-01-28 DIAGNOSIS — E785 Hyperlipidemia, unspecified: Secondary | ICD-10-CM | POA: Diagnosis present

## 2016-01-28 DIAGNOSIS — I10 Essential (primary) hypertension: Secondary | ICD-10-CM | POA: Diagnosis present

## 2016-01-28 DIAGNOSIS — M79651 Pain in right thigh: Secondary | ICD-10-CM | POA: Diagnosis present

## 2016-01-28 DIAGNOSIS — Z8673 Personal history of transient ischemic attack (TIA), and cerebral infarction without residual deficits: Secondary | ICD-10-CM

## 2016-01-28 DIAGNOSIS — Z8249 Family history of ischemic heart disease and other diseases of the circulatory system: Secondary | ICD-10-CM

## 2016-01-28 DIAGNOSIS — I513 Intracardiac thrombosis, not elsewhere classified: Secondary | ICD-10-CM | POA: Diagnosis present

## 2016-01-28 DIAGNOSIS — I6529 Occlusion and stenosis of unspecified carotid artery: Secondary | ICD-10-CM | POA: Diagnosis present

## 2016-01-28 DIAGNOSIS — R1012 Left upper quadrant pain: Secondary | ICD-10-CM | POA: Diagnosis present

## 2016-01-28 LAB — COMPREHENSIVE METABOLIC PANEL
ALBUMIN: 3.4 g/dL — AB (ref 3.5–5.0)
ALT: 9 U/L — ABNORMAL LOW (ref 17–63)
AST: 25 U/L (ref 15–41)
Alkaline Phosphatase: 83 U/L (ref 38–126)
Anion gap: 13 (ref 5–15)
BUN: 18 mg/dL (ref 6–20)
CHLORIDE: 108 mmol/L (ref 101–111)
CO2: 21 mmol/L — AB (ref 22–32)
Calcium: 8.9 mg/dL (ref 8.9–10.3)
Creatinine, Ser: 1.57 mg/dL — ABNORMAL HIGH (ref 0.61–1.24)
GFR calc Af Amer: 44 mL/min — ABNORMAL LOW (ref >60)
GFR calc non Af Amer: 38 mL/min — ABNORMAL LOW (ref >60)
GLUCOSE: 138 mg/dL — AB (ref 65–99)
POTASSIUM: 3.5 mmol/L (ref 3.5–5.1)
Sodium: 142 mmol/L (ref 135–145)
Total Bilirubin: 0.4 mg/dL (ref 0.3–1.2)
Total Protein: 7.2 g/dL (ref 6.5–8.1)

## 2016-01-28 LAB — URINALYSIS, ROUTINE W REFLEX MICROSCOPIC
BILIRUBIN URINE: NEGATIVE
GLUCOSE, UA: NEGATIVE mg/dL
KETONES UR: NEGATIVE mg/dL
LEUKOCYTES UA: NEGATIVE
Nitrite: NEGATIVE
PH: 7 (ref 5.0–8.0)
Protein, ur: 30 mg/dL — AB
Specific Gravity, Urine: 1.011 (ref 1.005–1.030)

## 2016-01-28 LAB — CBC
HEMATOCRIT: 45.8 % (ref 39.0–52.0)
Hemoglobin: 15.4 g/dL (ref 13.0–17.0)
MCH: 32.3 pg (ref 26.0–34.0)
MCHC: 33.6 g/dL (ref 30.0–36.0)
MCV: 96 fL (ref 78.0–100.0)
Platelets: 146 10*3/uL — ABNORMAL LOW (ref 150–400)
RBC: 4.77 MIL/uL (ref 4.22–5.81)
RDW: 15 % (ref 11.5–15.5)
WBC: 14.5 10*3/uL — ABNORMAL HIGH (ref 4.0–10.5)

## 2016-01-28 LAB — URINE MICROSCOPIC-ADD ON
Squamous Epithelial / LPF: NONE SEEN
WBC, UA: NONE SEEN WBC/hpf (ref 0–5)

## 2016-01-28 LAB — LIPASE, BLOOD: LIPASE: 22 U/L (ref 11–51)

## 2016-01-28 MED ORDER — IOHEXOL 350 MG/ML SOLN
100.0000 mL | Freq: Once | INTRAVENOUS | Status: AC | PRN
  Administered 2016-01-28: 100 mL via INTRAVENOUS

## 2016-01-28 MED ORDER — FENTANYL CITRATE (PF) 100 MCG/2ML IJ SOLN
50.0000 ug | Freq: Once | INTRAMUSCULAR | Status: AC
  Administered 2016-01-28: 50 ug via INTRAVENOUS
  Filled 2016-01-28: qty 2

## 2016-01-28 NOTE — ED Notes (Signed)
Pt informed RN that he has the urge to urinate but cannot. 500cc on bladder scan. Dr. Madilyn Hook informed. Foley cath will be placed.

## 2016-01-28 NOTE — ED Notes (Signed)
Pt is aware a urine sample is needed. Urinal at bedside.  

## 2016-01-28 NOTE — ED Notes (Signed)
Pt comes from home after having L sided abdominal pain today with nausea.  zofran IV given en route that helped. Also c/o R leg pain. Alert and oriented.

## 2016-01-28 NOTE — ED Notes (Signed)
Bed: [facility] Expected date:  Expected time:  Means of arrival:  Comments: EMS 

## 2016-01-29 ENCOUNTER — Encounter (HOSPITAL_COMMUNITY): Payer: Self-pay | Admitting: Nurse Practitioner

## 2016-01-29 DIAGNOSIS — Z79899 Other long term (current) drug therapy: Secondary | ICD-10-CM | POA: Diagnosis not present

## 2016-01-29 DIAGNOSIS — N179 Acute kidney failure, unspecified: Secondary | ICD-10-CM | POA: Diagnosis present

## 2016-01-29 DIAGNOSIS — Z950 Presence of cardiac pacemaker: Secondary | ICD-10-CM | POA: Diagnosis not present

## 2016-01-29 DIAGNOSIS — Z7982 Long term (current) use of aspirin: Secondary | ICD-10-CM | POA: Diagnosis not present

## 2016-01-29 DIAGNOSIS — R1012 Left upper quadrant pain: Secondary | ICD-10-CM | POA: Diagnosis present

## 2016-01-29 DIAGNOSIS — Z91018 Allergy to other foods: Secondary | ICD-10-CM | POA: Diagnosis not present

## 2016-01-29 DIAGNOSIS — R110 Nausea: Secondary | ICD-10-CM | POA: Diagnosis present

## 2016-01-29 DIAGNOSIS — Z9842 Cataract extraction status, left eye: Secondary | ICD-10-CM | POA: Diagnosis not present

## 2016-01-29 DIAGNOSIS — I5042 Chronic combined systolic (congestive) and diastolic (congestive) heart failure: Secondary | ICD-10-CM | POA: Diagnosis present

## 2016-01-29 DIAGNOSIS — I6529 Occlusion and stenosis of unspecified carotid artery: Secondary | ICD-10-CM | POA: Diagnosis present

## 2016-01-29 DIAGNOSIS — Z8673 Personal history of transient ischemic attack (TIA), and cerebral infarction without residual deficits: Secondary | ICD-10-CM | POA: Diagnosis not present

## 2016-01-29 DIAGNOSIS — I714 Abdominal aortic aneurysm, without rupture: Secondary | ICD-10-CM | POA: Diagnosis present

## 2016-01-29 DIAGNOSIS — Z8249 Family history of ischemic heart disease and other diseases of the circulatory system: Secondary | ICD-10-CM | POA: Diagnosis not present

## 2016-01-29 DIAGNOSIS — I495 Sick sinus syndrome: Secondary | ICD-10-CM | POA: Diagnosis present

## 2016-01-29 DIAGNOSIS — Z885 Allergy status to narcotic agent status: Secondary | ICD-10-CM | POA: Diagnosis not present

## 2016-01-29 DIAGNOSIS — I513 Intracardiac thrombosis, not elsewhere classified: Secondary | ICD-10-CM | POA: Diagnosis present

## 2016-01-29 DIAGNOSIS — I712 Thoracic aortic aneurysm, without rupture: Secondary | ICD-10-CM | POA: Diagnosis present

## 2016-01-29 DIAGNOSIS — E039 Hypothyroidism, unspecified: Secondary | ICD-10-CM | POA: Diagnosis present

## 2016-01-29 DIAGNOSIS — E785 Hyperlipidemia, unspecified: Secondary | ICD-10-CM | POA: Diagnosis present

## 2016-01-29 DIAGNOSIS — J449 Chronic obstructive pulmonary disease, unspecified: Secondary | ICD-10-CM | POA: Diagnosis present

## 2016-01-29 DIAGNOSIS — N183 Chronic kidney disease, stage 3 (moderate): Secondary | ICD-10-CM | POA: Diagnosis present

## 2016-01-29 DIAGNOSIS — I25810 Atherosclerosis of coronary artery bypass graft(s) without angina pectoris: Secondary | ICD-10-CM | POA: Diagnosis present

## 2016-01-29 DIAGNOSIS — Z888 Allergy status to other drugs, medicaments and biological substances status: Secondary | ICD-10-CM | POA: Diagnosis not present

## 2016-01-29 DIAGNOSIS — I129 Hypertensive chronic kidney disease with stage 1 through stage 4 chronic kidney disease, or unspecified chronic kidney disease: Secondary | ICD-10-CM | POA: Diagnosis present

## 2016-01-29 DIAGNOSIS — Z955 Presence of coronary angioplasty implant and graft: Secondary | ICD-10-CM | POA: Diagnosis not present

## 2016-01-29 DIAGNOSIS — N12 Tubulo-interstitial nephritis, not specified as acute or chronic: Secondary | ICD-10-CM | POA: Diagnosis present

## 2016-01-29 DIAGNOSIS — E860 Dehydration: Secondary | ICD-10-CM | POA: Diagnosis present

## 2016-01-29 DIAGNOSIS — M79604 Pain in right leg: Secondary | ICD-10-CM | POA: Diagnosis present

## 2016-01-29 DIAGNOSIS — I723 Aneurysm of iliac artery: Secondary | ICD-10-CM | POA: Diagnosis present

## 2016-01-29 DIAGNOSIS — R109 Unspecified abdominal pain: Secondary | ICD-10-CM | POA: Diagnosis present

## 2016-01-29 DIAGNOSIS — Z7902 Long term (current) use of antithrombotics/antiplatelets: Secondary | ICD-10-CM | POA: Diagnosis not present

## 2016-01-29 DIAGNOSIS — Z961 Presence of intraocular lens: Secondary | ICD-10-CM | POA: Diagnosis present

## 2016-01-29 DIAGNOSIS — Z8042 Family history of malignant neoplasm of prostate: Secondary | ICD-10-CM | POA: Diagnosis not present

## 2016-01-29 DIAGNOSIS — I739 Peripheral vascular disease, unspecified: Secondary | ICD-10-CM | POA: Diagnosis present

## 2016-01-29 DIAGNOSIS — I252 Old myocardial infarction: Secondary | ICD-10-CM | POA: Diagnosis not present

## 2016-01-29 DIAGNOSIS — Z9841 Cataract extraction status, right eye: Secondary | ICD-10-CM | POA: Diagnosis not present

## 2016-01-29 DIAGNOSIS — N390 Urinary tract infection, site not specified: Secondary | ICD-10-CM | POA: Diagnosis present

## 2016-01-29 DIAGNOSIS — Z87891 Personal history of nicotine dependence: Secondary | ICD-10-CM | POA: Diagnosis not present

## 2016-01-29 DIAGNOSIS — M79651 Pain in right thigh: Secondary | ICD-10-CM | POA: Diagnosis present

## 2016-01-29 DIAGNOSIS — N400 Enlarged prostate without lower urinary tract symptoms: Secondary | ICD-10-CM | POA: Diagnosis present

## 2016-01-29 LAB — CBC
HCT: 46.4 % (ref 39.0–52.0)
Hemoglobin: 15.6 g/dL (ref 13.0–17.0)
MCH: 32.2 pg (ref 26.0–34.0)
MCHC: 33.6 g/dL (ref 30.0–36.0)
MCV: 95.9 fL (ref 78.0–100.0)
PLATELETS: 141 10*3/uL — AB (ref 150–400)
RBC: 4.84 MIL/uL (ref 4.22–5.81)
RDW: 15 % (ref 11.5–15.5)
WBC: 12.3 10*3/uL — ABNORMAL HIGH (ref 4.0–10.5)

## 2016-01-29 LAB — BASIC METABOLIC PANEL
Anion gap: 11 (ref 5–15)
BUN: 17 mg/dL (ref 6–20)
CALCIUM: 9.1 mg/dL (ref 8.9–10.3)
CO2: 25 mmol/L (ref 22–32)
CREATININE: 1.7 mg/dL — AB (ref 0.61–1.24)
Chloride: 110 mmol/L (ref 101–111)
GFR calc non Af Amer: 35 mL/min — ABNORMAL LOW (ref >60)
GFR, EST AFRICAN AMERICAN: 40 mL/min — AB (ref >60)
Glucose, Bld: 115 mg/dL — ABNORMAL HIGH (ref 65–99)
Potassium: 4.2 mmol/L (ref 3.5–5.1)
SODIUM: 146 mmol/L — AB (ref 135–145)

## 2016-01-29 LAB — PROTIME-INR
INR: 1.22 (ref 0.00–1.49)
Prothrombin Time: 15.5 seconds — ABNORMAL HIGH (ref 11.6–15.2)

## 2016-01-29 LAB — BRAIN NATRIURETIC PEPTIDE: B Natriuretic Peptide: 865 pg/mL — ABNORMAL HIGH (ref 0.0–100.0)

## 2016-01-29 LAB — APTT: aPTT: 33 seconds (ref 24–37)

## 2016-01-29 LAB — GLUCOSE, CAPILLARY: Glucose-Capillary: 90 mg/dL (ref 65–99)

## 2016-01-29 LAB — LACTIC ACID, PLASMA: LACTIC ACID, VENOUS: 1.3 mmol/L (ref 0.5–2.0)

## 2016-01-29 LAB — CREATININE, URINE, RANDOM: Creatinine, Urine: 16.23 mg/dL

## 2016-01-29 MED ORDER — ISOSORBIDE MONONITRATE ER 30 MG PO TB24
30.0000 mg | ORAL_TABLET | Freq: Every day | ORAL | Status: DC
  Administered 2016-01-29 – 2016-02-01 (×4): 30 mg via ORAL
  Filled 2016-01-29 (×4): qty 1

## 2016-01-29 MED ORDER — ACETAMINOPHEN 325 MG PO TABS
650.0000 mg | ORAL_TABLET | Freq: Four times a day (QID) | ORAL | Status: DC | PRN
  Administered 2016-01-29: 650 mg via ORAL
  Filled 2016-01-29: qty 2

## 2016-01-29 MED ORDER — METOPROLOL SUCCINATE ER 100 MG PO TB24
100.0000 mg | ORAL_TABLET | Freq: Every day | ORAL | Status: DC
  Administered 2016-01-29 – 2016-02-01 (×4): 100 mg via ORAL
  Filled 2016-01-29 (×4): qty 1

## 2016-01-29 MED ORDER — ENOXAPARIN SODIUM 30 MG/0.3ML ~~LOC~~ SOLN
30.0000 mg | SUBCUTANEOUS | Status: DC
  Administered 2016-01-30 – 2016-02-01 (×3): 30 mg via SUBCUTANEOUS
  Filled 2016-01-29 (×3): qty 0.3

## 2016-01-29 MED ORDER — ENOXAPARIN SODIUM 40 MG/0.4ML ~~LOC~~ SOLN
40.0000 mg | SUBCUTANEOUS | Status: DC
  Administered 2016-01-29: 40 mg via SUBCUTANEOUS
  Filled 2016-01-29: qty 0.4

## 2016-01-29 MED ORDER — HYDROMORPHONE HCL 1 MG/ML IJ SOLN
1.0000 mg | INTRAMUSCULAR | Status: DC | PRN
  Administered 2016-01-29 – 2016-01-30 (×4): 1 mg via INTRAVENOUS
  Filled 2016-01-29 (×4): qty 1

## 2016-01-29 MED ORDER — ONDANSETRON HCL 4 MG/2ML IJ SOLN: 4.0000 mg | Freq: Three times a day (TID) | INTRAMUSCULAR | Status: DC | PRN

## 2016-01-29 MED ORDER — DEXTROSE 5 % IV SOLN
1.0000 g | INTRAVENOUS | Status: DC
  Administered 2016-01-30 – 2016-01-31 (×2): 1 g via INTRAVENOUS
  Filled 2016-01-29 (×3): qty 10

## 2016-01-29 MED ORDER — NITROGLYCERIN 0.4 MG SL SUBL
0.4000 mg | SUBLINGUAL_TABLET | SUBLINGUAL | Status: DC | PRN
Start: 2016-01-29 — End: 2016-02-01

## 2016-01-29 MED ORDER — SODIUM CHLORIDE 0.9 % IV SOLN: INTRAVENOUS | Status: DC

## 2016-01-29 MED ORDER — AMLODIPINE BESYLATE 5 MG PO TABS
5.0000 mg | ORAL_TABLET | Freq: Every day | ORAL | Status: DC
  Administered 2016-01-29 – 2016-02-01 (×4): 5 mg via ORAL
  Filled 2016-01-29 (×4): qty 1

## 2016-01-29 MED ORDER — DEXTROSE 5 % IV SOLN
1.0000 g | Freq: Once | INTRAVENOUS | Status: AC
  Administered 2016-01-29: 1 g via INTRAVENOUS
  Filled 2016-01-29: qty 10

## 2016-01-29 MED ORDER — HYDRALAZINE HCL 20 MG/ML IJ SOLN
5.0000 mg | INTRAMUSCULAR | Status: DC | PRN
  Filled 2016-01-29: qty 1

## 2016-01-29 MED ORDER — ATORVASTATIN CALCIUM 40 MG PO TABS
40.0000 mg | ORAL_TABLET | Freq: Every day | ORAL | Status: DC
  Administered 2016-01-29 – 2016-01-31 (×3): 40 mg via ORAL
  Filled 2016-01-29 (×4): qty 1

## 2016-01-29 MED ORDER — SODIUM CHLORIDE 0.9% FLUSH: 3.0000 mL | Freq: Two times a day (BID) | INTRAVENOUS | Status: DC

## 2016-01-29 MED ORDER — ASPIRIN 81 MG PO CHEW
81.0000 mg | CHEWABLE_TABLET | Freq: Every day | ORAL | Status: DC
  Administered 2016-01-29 – 2016-02-01 (×4): 81 mg via ORAL
  Filled 2016-01-29 (×4): qty 1

## 2016-01-29 MED ORDER — MORPHINE SULFATE (PF) 2 MG/ML IV SOLN
2.0000 mg | INTRAVENOUS | Status: DC | PRN
  Administered 2016-01-29: 2 mg via INTRAVENOUS
  Filled 2016-01-29: qty 1

## 2016-01-29 MED ORDER — TAMSULOSIN HCL 0.4 MG PO CAPS
0.4000 mg | ORAL_CAPSULE | ORAL | Status: DC
  Administered 2016-01-29 – 2016-01-31 (×3): 0.4 mg via ORAL
  Filled 2016-01-29 (×2): qty 1

## 2016-01-29 MED ORDER — CLOPIDOGREL BISULFATE 75 MG PO TABS
75.0000 mg | ORAL_TABLET | Freq: Every day | ORAL | Status: DC
  Administered 2016-01-29 – 2016-01-31 (×3): 75 mg via ORAL
  Filled 2016-01-29 (×4): qty 1

## 2016-01-29 NOTE — Progress Notes (Signed)
[facility] PROGRESS NOTE   [name] ZOX:[id number] DOB: [birth year] DOA: 01/28/2016 PCP: Delorse Lek, MD  HPI/Subjective: Patient is to complain about right thigh pain closer to his groin. Seen with daughter at bedside.  Assessment/Plan: Principal Problem:   Abdominal pain Active Problems:   HTN (hypertension)   Hyperlipidemia   History of CVA (cerebrovascular accident)   CAD (coronary artery disease) of artery bypass graft   Sick sinus syndrome (HCC)   Chronic combined systolic and diastolic CHF (congestive heart failure) (HCC)   Acute renal failure superimposed on stage 3 chronic kidney disease (HCC)   HLD (hyperlipidemia)   Cardiac pacemaker in situ   PAD (peripheral artery disease) (HCC)   BPH (benign prostatic hyperplasia)   H/O endarterectomy   Nausea   UTI (urinary tract infection)   Right leg pain   This is a no charge note, patient seen by my colleague Dr. Don Broach today.  I have seen the patient, examined him and have reviewed the chart. Patient admitted with nausea/vomiting, improving, also had right lower extremity pain. As left kidney enhancement suggesting pyelonephritis without evidence in the urine started on antibiotics.  Code Status: Full Code Family Communication: Plan discussed with the patient. Disposition Plan: Remains inpatient Diet: Diet Heart Room service appropriate?: Yes; Fluid consistency:: Thin  Consultants:  None   Procedures:  None  Antibiotics:  None    Objective: Filed Vitals:   01/29/16 0550 01/29/16 1000  BP: 149/80 167/88  Pulse: 74 70  Temp: 98.4 F (36.9 C)   Resp: 18     Intake/Output Summary (Last 24 hours) at 01/29/16 1228 Last data filed at 01/29/16 0556  Gross per 24 hour  Intake      0 ml  Output    860 ml  Net   -860 ml   Filed Weights   01/29/16 0206  Weight: 60.737 kg (133 lb 14.4 oz)    Exam: General: Alert and awake, oriented x3, not in any acute distress. HEENT: anicteric  sclera, pupils reactive to light and accommodation, EOMI CVS: S1-S2 clear, no murmur rubs or gallops Chest: clear to auscultation bilaterally, no wheezing, rales or rhonchi Abdomen: soft nontender, nondistended, normal bowel sounds, no organomegaly Extremities: no cyanosis, clubbing or edema noted bilaterally Neuro: Cranial nerves II-XII intact, no focal neurological deficits  Data Reviewed: Basic Metabolic Panel:  Recent Labs Lab 01/28/16 2058 01/29/16 0245  NA 142 146*  K 3.5 4.2  CL 108 110  CO2 21* 25  GLUCOSE 138* 115*  BUN 18 17  CREATININE 1.57* 1.70*  CALCIUM 8.9 9.1   Liver Function Tests:  Recent Labs Lab 01/28/16 2058  AST 25  ALT 9*  ALKPHOS 83  BILITOT 0.4  PROT 7.2  ALBUMIN 3.4*    Recent Labs Lab 01/28/16 2058  LIPASE 22   No results for input(s): AMMONIA in the last 168 hours. CBC:  Recent Labs Lab 01/28/16 2058 01/29/16 0245  WBC 14.5* 12.3*  HGB 15.4 15.6  HCT 45.8 46.4  MCV 96.0 95.9  PLT 146* 141*   Cardiac Enzymes: No results for input(s): CKTOTAL, CKMB, CKMBINDEX, TROPONINI in the last 168 hours. BNP (last 3 results)  Recent Labs  01/29/16 0245  BNP 865.0*    ProBNP (last 3 results) No results for input(s): PROBNP in the last 8760 hours.  CBG:  Recent Labs Lab 01/29/16 0742  GLUCAP 90    Micro No results found for this or any previous visit (from the past 240 hour(s)).  Studies: Ct Cta Abd/pel W/cm &/or W/o Cm  01/29/2016  CLINICAL DATA:  82 year old male with left-sided abdominal pain and nausea. Left leg pain. EXAM: CTA ABDOMEN AND PELVIS wITHOUT AND WITH CONTRAST TECHNIQUE: Multidetector CT imaging of the abdomen and pelvis was performed using the standard protocol during bolus administration of intravenous contrast. Multiplanar reconstructed images and MIPs were obtained and reviewed to evaluate the vascular anatomy. CONTRAST:  OMNIPAQUE IOHEXOL 350 MG/ML SOLN COMPARISON:  Noncontrast CT pelvis  07/03/2015. Contrast CT abdomen pelvis 04/06/2012 FINDINGS: Lower chest: Dilatation of the included descending thoracic aorta appears unchanged from chest CT 05/13/2015, maximal dimension 5 cm. There is eccentric mural thrombus. The heart is enlarged. Small bilateral pleural effusions. Subpleural reticular opacities in the lower lobes, concerning for fibrosis. Liver: Scattered hepatic cysts.  No suspicious lesion. Hepatobiliary: Gallbladder physiologically distended, no calcified stone. No biliary dilatation. Pancreas: No ductal dilatation or inflammation. Spleen: Normal. Adrenal glands: No nodule. Kidneys: Diffusely heterogeneous appearance of the left kidney, suggesting pyelonephritis. No hydronephrosis. Small cortical hypodensity in the right kidney, too small to characterize. Stomach/Bowel: Suboptimally assessed without contrast. Stomach physiologically distended with ingested contents. There are no dilated or thickened small bowel loops. Moderate volume of stool throughout the colon without colonic wall thickening. Diffuse colonic tortuosity. The appendix is normal. Vascular/Lymphatic: Aneurysmal dilatation of the infrarenal abdominal aorta. Dilatation begins 1.5 cm from the left renal artery. Maximal aortic dimension of by 4.2 x 4.2 cm progressed from 2013. There is circumferential mural thrombus. 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 Mural thrombus extends to the internal external iliac bifurcation, with the external iliac artery occluded. Aneurysmal dilatation of the right common iliac artery measures 2.4 cm, unchanged from July 2016 CT. There is aneurysmal dilatation of the right internal iliac artery measuring 2.2 cm, previously 2.0 cm. The proximal right common iliac artery measures 1.6 cm. There is no periaortic soft tissue stranding to suggest rupture. Stenosis of the origin celiac artery secondary to atherosclerosis. Stenosis of the proximal superior mesenteric artery  secondary to atherosclerosis. No abrupt occlusion. The IMA is patent. Two right and single left renal arteries are patent, with atherosclerosis at the origins. No retroperitoneal adenopathy. Reproductive: Prostate gland is enlarged. Bladder: Foley catheter in place.  Bladder wall thickening is seen. Other: No free air, free fluid, or intra-abdominal fluid collection. Musculoskeletal: There are no acute or suspicious osseous abnormalities. Age-related degenerative change in the spine with unilateral left L5 pars defect, no listhesis. Review of the MIP images confirms the above findings. IMPRESSION: 1. No evidence of acute aortic abnormality. Abdominal aortic aneurysm, current maximal dimension 4.2 cm, increased from 3 cm in 2013. There are aneurysms of the descending thoracic aorta, left greater than right common iliac arteries, right internal and external iliac arteries. The descending thoracic aortic aneurysm and left common iliac artery aneurysm have increased from 2013. There is diffuse mural thrombus. The recommended follow-up for in abdominal aortic aneurysm of this size is ultrasound 1 year. This recommendation follows ACR consensus guidelines: White Paper of the ACR Incidental Findings Committee II on Vascular Findings. J Am Coll Radiol 2013; 10:789-794. 2. Stenosis at the origin of the celiac and proximal SMA secondary to atherosclerosis. 3. Heterogeneous enhancement of the left kidney, concerning for pyelonephritis. 4. Lung base findings concerning for pulmonary fibrosis. 5. Additional findings as described. Electronically Signed   By: Rubye Oaks M.D.   On: 01/29/2016 00:06    Scheduled Meds: . amLODipine  5  mg Oral Daily  . aspirin  81 mg Oral Daily  . atorvastatin  40 mg Oral q1800  . cefTRIAXone (ROCEPHIN)  IV  1 g Intravenous Q24H  . clopidogrel  75 mg Oral QHS  . enoxaparin (LOVENOX) injection  40 mg Subcutaneous Q24H  . isosorbide mononitrate  30 mg Oral Daily  . metoprolol succinate   100 mg Oral Daily  . sodium chloride flush  3 mL Intravenous Q12H  . tamsulosin  0.4 mg Oral PC supper   Continuous Infusions:      Time spent: 35 minutes    [facility]  [facility] Pager [phone] If 7PM-7AM, please contact night-coverage at [email or web address], password [facility] 01/29/2016, 12:28 PM  LOS: 0 days

## 2016-01-29 NOTE — ED Provider Notes (Signed)
CSN: [id number]     Arrival date & time 01/28/16  2037 History   First MD Initiated Contact with Patient 01/28/16 2104     Chief Complaint  Patient presents with  . Abdominal Pain     Patient is a 82 y.o. male presenting with abdominal pain. The history is provided by the patient. No language interpreter was used.  Abdominal Pain  [name] is a 82 y.o. male who presents to the Emergency Department complaining of abdominal pain.  He reports diffuse lower abdominal pain that started around 3 today. He is unable to urinate and has had associated vomiting. Symptoms are severe, constant, worsening. Pain is worse on the left side of his abdomen. He denies any fevers, constipation, diarrhea.  Past Medical History  Diagnosis Date  . Hypertension   . CAD (coronary artery disease)     a. s/p MI in 1988;  b. s/p CABG in 2000 (Dr. Barry Dienes) x 4, LIMA to LAD, SVG to diagonal, SVG to [facility], SVG to PDA;  c. 12/2013 NSTEMI/abnl MV;  d. 01/2014 Cath/PCI: native 3VD, VG->RCA 100, VG->OM aneurysmal 40ost/m, 51m(5.0x12 Veriflex BMS), VG->Diag aneurysmal, LIMA->LAD nl.  . TIA (transient ischemic attack)   . Hyperlipidemia   . COPD (chronic obstructive pulmonary disease) (HCC)   . CVA (cerebral infarction) 07/2011    Remote left brain infarct with sensory deficits and had a left internal carotid stent placed by Dr. [name] at that time, no recurrent CVA or TIAs or CNS events.  Marland Kitchen PAD (peripheral artery disease) (HCC)     Stable. Had a past RFPBG by Dr. [name] in 2009. this graft is occluded with reconstitution in the popliteal artery, which was patent. Right tibial was occluded and 2-vessel runoff on angiography done by [name] in 12/2010.  . Sick sinus syndrome (HCC)     PPM implanted for this and syncope   . Chronic systolic CHF (congestive heart failure) (HCC)     Hospitalization for CHF from 05/04/13-05/06/13. Treated medically. Had possible pneumonia with short course of antibiotics.  . Abdominal bruit      Loud  . AAA (abdominal aortic aneurysm) (HCC)     Has a known AAA of 3.2 x 3.4 cm by abdominal untrasound 03/27/12. Followup abdominal ultrasound on 03/29/13 showed a diameter of 3.6 cm, which is stable  . Peripheral vascular disease (HCC)   . Hypothyroid     On supplement "don't know if I take RX or not for my thyroid" (01/16/2014)  . Stroke (HCC)   . Syncope     tilt table test/8.10.01/orthostatic hypotension  . Orthostatic hypotension   . Presence of permanent cardiac pacemaker   . Pneumonia   . Headache    Past Surgical History  Procedure Laterality Date  . Femoral bypass Right ?2001  . Carotid stent Left 06/23/11    By [name]  . Coronary artery bypass graft  2000    1st in 2000 (Dr. Barry Dienes) x 4, LIMA to LAD, SVG to diagonal, SVG to OM4, SVG to PDA. Re-cath in May 2012 with patent LIMA to LAD, patent SVG to diagonal, patent SVG to OM with left-to-right collaterals to an occluded right.  . Coronary angioplasty with stent placement  2001-01/16/2014    "think today makes #4" (01/16/2014)  . Cardiac catheterization  2000  . Inguinal hernia repair Right 1980's  . Insert / replace / remove pacemaker      implanted 2003 by Dr Jenne Campus with gen change (MDT [id number]) 08/2010 by  [facility]  . Cataract extraction w/ intraocular lens  implant, bilateral Bilateral   . Abdominal aortagram N/A 02/22/2012    Procedure: ABDOMINAL AORTAGRAM;  Surgeon: Nada Libman, MD;  Location: [facility] CATH LAB;  Service: Cardiovascular;  Laterality: N/A;  . Carotid angiogram Bilateral 02/22/2012    Procedure: CAROTID ANGIOGRAM;  Surgeon: Nada Libman, MD;  Location: [facility] CATH LAB;  Service: Cardiovascular;  Laterality: Bilateral;  . Left heart catheterization with coronary/graft angiogram N/A 01/16/2014    Procedure: LEFT HEART CATHETERIZATION WITH Isabel Caprice;  Surgeon: Kathleene Hazel, MD;  Location: [facility] CATH LAB;  Service: Cardiovascular;  Laterality: N/A;  . Abdominal aortagram    . Colonoscopy    .  Peripheral vascular catheterization N/A 06/24/2015    Procedure: Abdominal Aortogram;  Surgeon: Nada Libman, MD;  Location: [facility] INVASIVE CV LAB;  Service: Cardiovascular;  Laterality: N/A;  . Endarterectomy femoral Right 06/25/2015    Procedure: ENDARTERECTOMY RIGHT FEMORAL ARTERY ;  Surgeon: Larina Earthly, MD;  Location: [facility] OR;  Service: Vascular;  Laterality: Right;  . Patch angioplasty Right 06/25/2015    Procedure: RIGHT COMMON FEMORAL ARTERY AND PROFUNDA PATCH ANGIOPLASTY USING HEMASHIELD PLATINUM FINESSE PATCH ;  Surgeon: Larina Earthly, MD;  Location: [facility] OR;  Service: Vascular;  Laterality: Right;   Family History  Problem Relation Age of Onset  . Cancer Brother 60    oral   . Prostate cancer Brother   . Hypertension Brother   . Hyperlipidemia Brother   . Lung disease Father     black lung disease   Social History  Substance Use Topics  . Smoking status: Former Smoker -- 0.50 packs/day for 68 years    Types: Cigarettes    Quit date: 05/02/2015  . Smokeless tobacco: Never Used  . Alcohol Use: No    Review of Systems  Gastrointestinal: Positive for abdominal pain.  All other systems reviewed and are negative.     Allergies  Pravachol; Simvastatin; Other; Oxycodone; and Terazosin  Home Medications   Prior to Admission medications   Medication Sig Start Date End Date Taking? Authorizing Provider  acetaminophen (TYLENOL) 325 MG tablet Take 650 mg by mouth every 6 (six) hours as needed. Reported on 11/25/2015   Yes Historical Provider, MD  amLODipine (NORVASC) 5 MG tablet TAKE 1 TABLET (5 MG TOTAL) BY MOUTH DAILY. 12/15/15  Yes Lars Masson, MD  aspirin 81 MG tablet Take 81 mg by mouth daily.   Yes Historical Provider, MD  atorvastatin (LIPITOR) 40 MG tablet Take 1 tablet (40 mg total) by mouth daily at 6 PM. 05/14/15  Yes Brittainy Sherlynn Carbon, PA-C  clopidogrel (PLAVIX) 75 MG tablet Take 1 tablet (75 mg total) by mouth at bedtime. Resume in 1 week. 07/11/15  Yes Rodolph Bong, MD  furosemide (LASIX) 20 MG tablet Take 1 tablet (20 mg total) by mouth daily. 10/27/15  Yes Lars Masson, MD  isosorbide mononitrate (IMDUR) 30 MG 24 hr tablet TAKE 1 TABLET (30 MG TOTAL) BY MOUTH DAILY. 10/10/15  Yes Lars Masson, MD  metoprolol succinate (TOPROL-XL) 100 MG 24 hr tablet Take 100 mg by mouth daily. Take with or immediately following a meal.   Yes Historical Provider, MD  nitroGLYCERIN (NITROSTAT) 0.4 MG SL tablet Place 1 tablet (0.4 mg total) under the tongue every 5 (five) minutes as needed. For chest pain 10/27/15  Yes Lars Masson, MD  potassium chloride (K-DUR) 10 MEQ tablet Take 1 tablet (10  mEq total) by mouth daily. 07/24/15  Yes Lars Masson, MD  Tamsulosin HCl (FLOMAX) 0.4 MG CAPS Take 0.4 mg by mouth daily after supper.    Yes Historical Provider, MD   BP 195/93 mmHg  Pulse 74  Temp(Src) 97.3 F (36.3 C) (Oral)  Resp 18  SpO2 96% Physical Exam  Constitutional: He is oriented to person, place, and time. He appears well-developed.  Frail  HENT:  Head: Normocephalic and atraumatic.  Cardiovascular: Normal rate and regular rhythm.   No murmur heard. 2+ femoral pulses bilaterally  Pulmonary/Chest: Effort normal and breath sounds normal. No respiratory distress.  Abdominal:  Severe diffuse abdominal tenderness, mildly distended  Musculoskeletal: He exhibits no tenderness.  1+ pitting edema of the right lower extremity  Neurological: He is alert and oriented to person, place, and time.  Skin: Skin is warm and dry.  Psychiatric: He has a normal mood and affect. His behavior is normal.  Nursing note and vitals reviewed.   ED Course  Procedures (including critical care time) Labs Review Labs Reviewed  COMPREHENSIVE METABOLIC PANEL - Abnormal; Notable for the following:    CO2 21 (*)    Glucose, Bld 138 (*)    Creatinine, Ser 1.57 (*)    Albumin 3.4 (*)    ALT 9 (*)    GFR calc non Af Amer 38 (*)    GFR calc Af Amer 44 (*)     All other components within normal limits  CBC - Abnormal; Notable for the following:    WBC 14.5 (*)    Platelets 146 (*)    All other components within normal limits  URINALYSIS, ROUTINE W REFLEX MICROSCOPIC (NOT AT [facility]) - Abnormal; Notable for the following:    Hgb urine dipstick MODERATE (*)    Protein, ur 30 (*)    All other components within normal limits  URINE MICROSCOPIC-ADD ON - Abnormal; Notable for the following:    Bacteria, UA RARE (*)    All other components within normal limits  URINE CULTURE  LIPASE, BLOOD    Imaging Review Ct Cta Abd/pel W/cm &/or W/o Cm  01/29/2016  CLINICAL DATA:  82 year old male with left-sided abdominal pain and nausea. Left leg pain. EXAM: CTA ABDOMEN AND PELVIS wITHOUT AND WITH CONTRAST TECHNIQUE: Multidetector CT imaging of the abdomen and pelvis was performed using the standard protocol during bolus administration of intravenous contrast. Multiplanar reconstructed images and MIPs were obtained and reviewed to evaluate the vascular anatomy. CONTRAST:  OMNIPAQUE IOHEXOL 350 MG/ML SOLN COMPARISON:  Noncontrast CT pelvis 07/03/2015. Contrast CT abdomen pelvis 04/06/2012 FINDINGS: Lower chest: Dilatation of the included descending thoracic aorta appears unchanged from chest CT 05/13/2015, maximal dimension 5 cm. There is eccentric mural thrombus. The heart is enlarged. Small bilateral pleural effusions. Subpleural reticular opacities in the lower lobes, concerning for fibrosis. Liver: Scattered hepatic cysts.  No suspicious lesion. Hepatobiliary: Gallbladder physiologically distended, no calcified stone. No biliary dilatation. Pancreas: No ductal dilatation or inflammation. Spleen: Normal. Adrenal glands: No nodule. Kidneys: Diffusely heterogeneous appearance of the left kidney, suggesting pyelonephritis. No hydronephrosis. Small cortical hypodensity in the right kidney, too small to characterize. Stomach/Bowel: Suboptimally assessed without  contrast. Stomach physiologically distended with ingested contents. There are no dilated or thickened small bowel loops. Moderate volume of stool throughout the colon without colonic wall thickening. Diffuse colonic tortuosity. The appendix is normal. Vascular/Lymphatic: Aneurysmal dilatation of the infrarenal abdominal aorta. Dilatation begins 1.5 cm from the left renal artery. Maximal aortic dimension  of by 4.2 x 4.2 cm progressed from 2013. There is circumferential mural thrombus. 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 Mural thrombus extends to the internal external iliac bifurcation, with the external iliac artery occluded. Aneurysmal dilatation of the right common iliac artery measures 2.4 cm, unchanged from July 2016 CT. There is aneurysmal dilatation of the right internal iliac artery measuring 2.2 cm, previously 2.0 cm. The proximal right common iliac artery measures 1.6 cm. There is no periaortic soft tissue stranding to suggest rupture. Stenosis of the origin celiac artery secondary to atherosclerosis. Stenosis of the proximal superior mesenteric artery secondary to atherosclerosis. No abrupt occlusion. The IMA is patent. Two right and single left renal arteries are patent, with atherosclerosis at the origins. No retroperitoneal adenopathy. Reproductive: Prostate gland is enlarged. Bladder: Foley catheter in place.  Bladder wall thickening is seen. Other: No free air, free fluid, or intra-abdominal fluid collection. Musculoskeletal: There are no acute or suspicious osseous abnormalities. Age-related degenerative change in the spine with unilateral left L5 pars defect, no listhesis. Review of the MIP images confirms the above findings. IMPRESSION: 1. No evidence of acute aortic abnormality. Abdominal aortic aneurysm, current maximal dimension 4.2 cm, increased from 3 cm in 2013. There are aneurysms of the descending thoracic aorta, left greater than right common iliac  arteries, right internal and external iliac arteries. The descending thoracic aortic aneurysm and left common iliac artery aneurysm have increased from 2013. There is diffuse mural thrombus. The recommended follow-up for in abdominal aortic aneurysm of this size is ultrasound 1 year. This recommendation follows ACR consensus guidelines: White Paper of the ACR Incidental Findings Committee II on Vascular Findings. J Am Coll Radiol 2013; 10:789-794. 2. Stenosis at the origin of the celiac and proximal SMA secondary to atherosclerosis. 3. Heterogeneous enhancement of the left kidney, concerning for pyelonephritis. 4. Lung base findings concerning for pulmonary fibrosis. 5. Additional findings as described. Electronically Signed   By: Rubye Oaks M.D.   On: 01/29/2016 00:06   I have personally reviewed and evaluated these images and lab results as part of my medical decision-making.   EKG Interpretation None      MDM   Final diagnoses:  Abdominal pain, unspecified abdominal location    Patient here for evaluation of abdominal pain, vomiting, urinary retention. Patient with 500 mL in his bladder, Foley catheter placed. Patient feels partially improved after Foley catheter placement but he had ongoing left-sided abdominal pain. He has mild tenderness in that area. He also has a palpable pulsatile mass in his abdomen. CTA obtained that demonstrates an aortic aneurysm that is slightly increased from prior. He also is concerning for pyelonephritis. Given his white count and left-sided abdominal pain will treat for pyelonephritis in the setting of a relatively clear urinalysis. Urine cultures are pending. Plan to admit for pain control, IV antibiotics. Patient is hypertensive in the emergency department but he did not take his home medications. Providing doses of his medications. Hospital's consult for admission.    Tilden Fossa, MD 01/29/16 [phone]

## 2016-01-29 NOTE — Care Management Note (Signed)
Case Management Note  Patient Details  Name: [name] MRN: [id number] Date of Birth: 11-24-1927  Subjective/Objective:   82 y/o m admitted w/n/v/adb pain. Pyelonephritis, abd aortic aneurysm. From home. PT cons await recc.                Action/Plan:d/c plan home.   Expected Discharge Date:                Expected Discharge Plan:  Home w Home Health Services  In-House Referral:     Discharge planning Services  CM Consult  Post Acute Care Choice:    Choice offered to:     DME Arranged:    DME Agency:     HH Arranged:    HH Agency:     Status of Service:  In process, will continue to follow  Medicare Important Message Given:    Date Medicare IM Given:    Medicare IM give by:    Date Additional Medicare IM Given:    Additional Medicare Important Message give by:     If discussed at Long Length of Stay Meetings, dates discussed:    Additional Comments:  Lanier Clam, RN 01/29/2016, 12:38 PM

## 2016-01-29 NOTE — Evaluation (Signed)
Physical Therapy Evaluation Patient Details Name: [name] MRN: [id number] DOB: 1927/09/26 Today's Date: 01/29/2016   History of Present Illness  82 y.o. male with PMH of carotid artery stenosis (S/P of left internal carotid artery stent), PAD (S/p of right popliteal artery RFPBG and stent), hypertension, hyperlipidemia, COPD, hypothyroidism, CAD, S/P of CABG and the stent, sick sinus syndrome, S/P of pacemaker placement, combined systolic and diastolic congestive heart failure, AAA, BPH, chronic kidney disease-status 3, who presents with nausea, vomiting, abdominal pain and leg pain.  Clinical Impression  Pt admitted with above diagnosis. Pt currently with functional limitations due to the deficits listed below (see PT Problem List).  Pt will benefit from skilled PT to increase their independence and safety with mobility to allow discharge to the venue listed below.  Pt's mobility limited today due to R anterior medial thigh pain.  Pt reports stabbing pain which appears chronic however now so intense pt is not tolerating ambulation (only able to transfer to recliner).  Pt may need SNF if pain remains limiting mobility and/or assist not available at home.     Follow Up Recommendations Supervision/Assistance - 24 hour;Home health PT (if assist not available, pt may need SNF)    Equipment Recommendations  None recommended by PT    Recommendations for Other Services       Precautions / Restrictions Precautions Precautions: Fall      Mobility  Bed Mobility Overal bed mobility: Needs Assistance Bed Mobility: Supine to Sit     Supine to sit: Min assist     General bed mobility comments: pt self assisted R LE  Transfers Overall transfer level: Needs assistance Equipment used: None Transfers: Sit to/from [employer] Sit to Stand: Min assist Stand pivot transfers: Min assist       General transfer comment: verbal cues for safe technique, assist to rise and  steady as well as control descent, pt limited by L thigh pain - felt unable to tolerate ambulation  Ambulation/Gait                Stairs            Wheelchair Mobility    Modified Rankin (Stroke Patients Only)       Balance                                             Pertinent Vitals/Pain Pain Assessment: 0-10 Pain Score: 10-Worst pain ever Pain Location: right anterior medial thigh Pain Descriptors / Indicators: Stabbing Pain Intervention(s): Limited activity within patient's tolerance;Monitored during session;Patient requesting pain meds-RN notified;Repositioned    Home Living Family/patient expects to be discharged to:: Private residence Living Arrangements: Alone Available Help at Discharge: Family;Available 24 hours/day Type of Home: House Home Access: Stairs to enter   [employer] of Steps: 2 Home Layout: One level Home Equipment: Walker - 2 wheels;Wheelchair - manual;Bedside commode;Cane - single point      Prior Function Level of Independence: Independent               Hand Dominance        Extremity/Trunk Assessment               Lower Extremity Assessment: Generalized weakness         Communication   Communication: No difficulties  Cognition Arousal/Alertness: Awake/alert Behavior During Therapy: WFL for tasks assessed/performed Overall  Cognitive Status: Within Functional Limits for tasks assessed                      General Comments      Exercises        Assessment/Plan    PT Assessment Patient needs continued PT services  PT Diagnosis Difficulty walking   PT Problem List Decreased mobility;Decreased knowledge of use of DME;Pain  PT Treatment Interventions DME instruction;Gait training;Functional mobility training;Patient/family education;Therapeutic activities;Therapeutic exercise   PT Goals (Current goals can be found in the Care Plan section) Acute Rehab PT Goals PT  Goal Formulation: With patient Time For Goal Achievement: 02/12/16 Potential to Achieve Goals: Good    Frequency Min 3X/week   Barriers to discharge        Co-evaluation               End of Session Equipment Utilized During Treatment: Gait belt Activity Tolerance: Patient limited by pain Patient left: in chair;with call bell/phone within reach;with family/visitor present Nurse Communication: Mobility status;Patient requests pain meds (visitor to notify RN when leaving room, pt aware to call for assist if getting out of chair)         Time: 1002-1016 PT Time Calculation (min) (ACUTE ONLY): 14 min   Charges:   PT Evaluation $PT Eval Low Complexity: 1 Procedure     PT G Codes:        Raequon Catanzaro,KATHrine E 01/29/2016, 11:52 AM Zenovia Jarred, PT, DPT 01/29/2016 Pager: [phone]

## 2016-01-29 NOTE — H&P (Addendum)
[facility] History and Physical  [name] AVW:[id number] DOB: 1927/02/02 DOA: 01/28/2016  Referring physician: ED physician PCP: Delorse Lek, MD  Specialists:   Chief Complaint: Nausea, vomiting, abdominal pain, right leg pain  HPI: [name] is a 82 y.o. male with PMH of carotid artery stenosis (S/P of left internal carotid artery stent), PAD (S/p of right popliteal artery RFPBG and stent), hypertension, hyperlipidemia, COPD, hypothyroidism, CAD, S/P of CABG and the stent, sick sinus syndrome, S/P of pacemaker placement, combined systolic and diastolic congestive heart failure, AAA, BPH, chronic kidney disease-status 3, who presents with nausea, vomiting, abdominal pain and leg pain.  Patient reports that he started having nausea, vomiting and abdominal pain today. He vomited twice without blood in the vomitus. No diarrhea. His abdominal pain is located in the left upper quadrant, constant, nonradiating, initially severe, currently minimal. Patient does not have fever or chills. He had urinary retention in ED and Foley was placed. He also reports right leg pain, which has been going on for more than 2 months. His right leg pain is located in the upper medial thigh area. It is intermittent, worse in the night, and better during the daytime. There is no tenderness over calf area. Patient does not have chest pain, shortness of breath, unilateral weakness. He has increased urinary frequency, but no burning or dysuria.  In ED, patient was found to have WBC 14.5, negative urinalysis, lipase 22, temperature normal, no tachycardia, worsening renal function. Patient's admitted to inpatient for further interventional treatment.  CT angiogram of abdomen/pelvis showed no evidence of acute aortic abnormality, but showed multiple aneurysms or stenosis without signs of rapture. CTA also showed heterogeneous enhancement of the left kidney, concerning for pyelonephritis. Abdominal aortic aneurysm,  current maximal dimension 4.2 cm, increased from 3 cm in 2013. There are aneurysms of the descending thoracic aorta, left greater than right common iliac arteries, right internal and external iliac arteries. The descending thoracic aortic aneurysm and left common iliac artery aneurysm have increased from 2013. There is diffuse mural thrombus.  EKG:  Not done in ED, will get one.   Where does patient live?   At home  Can patient participate in ADLs?   Little  Review of Systems:   General: no fevers, chills, no changes in body weight, has fatigue HEENT: no blurry vision, hearing changes or sore throat Pulm: no dyspnea, coughing, wheezing CV: no chest pain, no palpitations Abd: has nausea, vomiting, abdominal pain, no diarrhea, constipation GU: no dysuria, burning on urination, has increased urinary frequency, no hematuria  Ext: no leg edema. Has right leg pain. Neuro: no unilateral weakness, numbness, or tingling, no vision change or hearing loss Skin: no rash MSK: No muscle spasm, no deformity, no limitation of range of movement in spin Heme: No easy bruising.  Travel history: No recent long distant travel.  Allergy:  Allergies  Allergen Reactions  . Pravachol Other (See Comments)    myalgias  . Simvastatin Other (See Comments)    myalgia  . Other Other (See Comments)    GREEN BEANS AND ONIONS  . Oxycodone Other (See Comments)    Family member reports "he was highly agitated and disoriented"   . Terazosin Other (See Comments)    unknown    Past Medical History  Diagnosis Date  . Hypertension   . CAD (coronary artery disease)     a. s/p MI in 1988;  b. s/p CABG in 2000 (Dr. Barry Dienes) x 4, LIMA to LAD, SVG to  diagonal, SVG to OM4, SVG to PDA;  c. 12/2013 NSTEMI/abnl MV;  d. 01/2014 Cath/PCI: native 3VD, VG->RCA 100, VG->OM aneurysmal 40ost/m, 28m(5.0x12 Veriflex BMS), VG->Diag aneurysmal, LIMA->LAD nl.  . TIA (transient ischemic attack)   . Hyperlipidemia   . COPD (chronic  obstructive pulmonary disease) (HCC)   . CVA (cerebral infarction) 07/2011    Remote left brain infarct with sensory deficits and had a left internal carotid stent placed by Dr. [name] at that time, no recurrent CVA or TIAs or CNS events.  Marland Kitchen PAD (peripheral artery disease) (HCC)     Stable. Had a past RFPBG by Dr. [name] in 2009. this graft is occluded with reconstitution in the popliteal artery, which was patent. Right tibial was occluded and 2-vessel runoff on angiography done by [name] in 12/2010.  . Sick sinus syndrome (HCC)     PPM implanted for this and syncope   . Chronic systolic CHF (congestive heart failure) (HCC)     Hospitalization for CHF from 05/04/13-05/06/13. Treated medically. Had possible pneumonia with short course of antibiotics.  . Abdominal bruit     Loud  . AAA (abdominal aortic aneurysm) (HCC)     Has a known AAA of 3.2 x 3.4 cm by abdominal untrasound 03/27/12. Followup abdominal ultrasound on 03/29/13 showed a diameter of 3.6 cm, which is stable  . Peripheral vascular disease (HCC)   . Hypothyroid     On supplement "don't know if I take RX or not for my thyroid" (01/16/2014)  . Stroke (HCC)   . Syncope     tilt table test/8.10.01/orthostatic hypotension  . Orthostatic hypotension   . Presence of permanent cardiac pacemaker   . Pneumonia   . Headache     Past Surgical History  Procedure Laterality Date  . Femoral bypass Right ?2001  . Carotid stent Left 06/23/11    By [name]  . Coronary artery bypass graft  2000    1st in 2000 (Dr. Barry Dienes) x 4, LIMA to LAD, SVG to diagonal, SVG to OM4, SVG to PDA. Re-cath in May 2012 with patent LIMA to LAD, patent SVG to diagonal, patent SVG to OM with left-to-right collaterals to an occluded right.  . Coronary angioplasty with stent placement  2001-01/16/2014    "think today makes #4" (01/16/2014)  . Cardiac catheterization  2000  . Inguinal hernia repair Right 1980's  . Insert / replace / remove pacemaker       implanted 2003 by Dr Jenne Campus with gen change (MDT [id number]) 08/[facility] by [facility]  . Cataract extraction w/ intraocular lens  implant, bilateral Bilateral   . Abdominal aortagram N/A 02/22/2012    Procedure: ABDOMINAL AORTAGRAM;  Surgeon: Nada Libman, MD;  Location: [facility] CATH LAB;  Service: Cardiovascular;  Laterality: N/A;  . Carotid angiogram Bilateral 02/22/2012    Procedure: CAROTID ANGIOGRAM;  Surgeon: Nada Libman, MD;  Location: [facility] CATH LAB;  Service: Cardiovascular;  Laterality: Bilateral;  . Left heart catheterization with coronary/graft angiogram N/A 01/16/2014    Procedure: LEFT HEART CATHETERIZATION WITH Isabel Caprice;  Surgeon: Kathleene Hazel, MD;  Location: [facility] CATH LAB;  Service: Cardiovascular;  Laterality: N/A;  . Abdominal aortagram    . Colonoscopy    . Peripheral vascular catheterization N/A 06/24/2015    Procedure: Abdominal Aortogram;  Surgeon: Nada Libman, MD;  Location: [facility] INVASIVE CV LAB;  Service: Cardiovascular;  Laterality: N/A;  . Endarterectomy femoral Right 06/25/2015    Procedure: ENDARTERECTOMY RIGHT FEMORAL ARTERY ;  Surgeon:  Larina Earthly, MD;  Location: [facility] OR;  Service: Vascular;  Laterality: Right;  . Patch angioplasty Right 06/25/2015    Procedure: RIGHT COMMON FEMORAL ARTERY AND PROFUNDA PATCH ANGIOPLASTY USING HEMASHIELD PLATINUM FINESSE PATCH ;  Surgeon: Larina Earthly, MD;  Location: [facility] OR;  Service: Vascular;  Laterality: Right;    Social History:  reports that he quit smoking about 8 months ago. His smoking use included Cigarettes. He has a 34 pack-year smoking history. He has never used smokeless tobacco. He reports that he does not drink alcohol or use illicit drugs.  Family History:  Family History  Problem Relation Age of Onset  . Cancer Brother 60    oral   . Prostate cancer Brother   . Hypertension Brother   . Hyperlipidemia Brother   . Lung disease Father     black lung disease     Prior to Admission medications   Medication  Sig Start Date End Date Taking? Authorizing Provider  acetaminophen (TYLENOL) 325 MG tablet Take 650 mg by mouth every 6 (six) hours as needed. Reported on 11/25/2015   Yes Historical Provider, MD  amLODipine (NORVASC) 5 MG tablet TAKE 1 TABLET (5 MG TOTAL) BY MOUTH DAILY. 12/15/15  Yes Lars Masson, MD  aspirin 81 MG tablet Take 81 mg by mouth daily.   Yes Historical Provider, MD  atorvastatin (LIPITOR) 40 MG tablet Take 1 tablet (40 mg total) by mouth daily at 6 PM. 05/14/15  Yes Brittainy Sherlynn Carbon, PA-C  clopidogrel (PLAVIX) 75 MG tablet Take 1 tablet (75 mg total) by mouth at bedtime. Resume in 1 week. 07/11/15  Yes Rodolph Bong, MD  furosemide (LASIX) 20 MG tablet Take 1 tablet (20 mg total) by mouth daily. 10/27/15  Yes Lars Masson, MD  isosorbide mononitrate (IMDUR) 30 MG 24 hr tablet TAKE 1 TABLET (30 MG TOTAL) BY MOUTH DAILY. 10/10/15  Yes Lars Masson, MD  metoprolol succinate (TOPROL-XL) 100 MG 24 hr tablet Take 100 mg by mouth daily. Take with or immediately following a meal.   Yes Historical Provider, MD  nitroGLYCERIN (NITROSTAT) 0.4 MG SL tablet Place 1 tablet (0.4 mg total) under the tongue every 5 (five) minutes as needed. For chest pain 10/27/15  Yes Lars Masson, MD  potassium chloride (K-DUR) 10 MEQ tablet Take 1 tablet (10 mEq total) by mouth daily. 07/24/15  Yes Lars Masson, MD  Tamsulosin HCl (FLOMAX) 0.4 MG CAPS Take 0.4 mg by mouth daily after supper.    Yes Historical Provider, MD    Physical Exam: Filed Vitals:   01/28/16 2346 01/29/16 0030 01/29/16 0130 01/29/16 0206  BP: 195/93 186/88 175/93 180/90  Pulse: 74 73 72 71  Temp:    98 F (36.7 C)  TempSrc:    Oral  Resp: 18  16 18   Height:    5\' 9"  (1.753 m)  Weight:    60.737 kg (133 lb 14.4 oz)  SpO2: 96% 93% 93% 98%   General: Not in acute distress HEENT:       Eyes: PERRL, EOMI, no scleral icterus.       ENT: No discharge from the ears and nose, no pharynx injection, no tonsillar  enlargement.        Neck: No JVD, no bruit, no mass felt. Heme: No neck lymph node enlargement. Cardiac: S1/S2, RRR, No murmurs, No gallops or rubs. Pulm: No rales, wheezing, rhonchi or rubs. Abd: Soft, nondistended, mild tenderness over LUQ, no  rebound pain, no organomegaly, BS present. Ext: No pitting leg edema bilaterally. There is no swelling, redness or warmth over right leg or thigh. Has left DP/PT pulse. The right DP pulse is absence, the right PT pulse is palpable which is confirmed strong by portable doppler (I personally performed doppler at bedside) Musculoskeletal: No joint deformities, No joint redness or warmth, no limitation of ROM in spin. Skin: No rashes.  Neuro: Alert, oriented X3, cranial nerves II-XII grossly intact, moves all extremities. Psych: Patient is not psychotic, no suicidal or hemocidal ideation.  Labs on Admission:  Basic Metabolic Panel:  Recent Labs Lab 01/28/16 2058  NA 142  K 3.5  CL 108  CO2 21*  GLUCOSE 138*  BUN 18  CREATININE 1.57*  CALCIUM 8.9   Liver Function Tests:  Recent Labs Lab 01/28/16 2058  AST 25  ALT 9*  ALKPHOS 83  BILITOT 0.4  PROT 7.2  ALBUMIN 3.4*    Recent Labs Lab 01/28/16 2058  LIPASE 22   No results for input(s): AMMONIA in the last 168 hours. CBC:  Recent Labs Lab 01/28/16 2058  WBC 14.5*  HGB 15.4  HCT 45.8  MCV 96.0  PLT 146*   Cardiac Enzymes: No results for input(s): CKTOTAL, CKMB, CKMBINDEX, TROPONINI in the last 168 hours.  BNP (last 3 results) No results for input(s): BNP in the last 8760 hours.  ProBNP (last 3 results) No results for input(s): PROBNP in the last 8760 hours.  CBG: No results for input(s): GLUCAP in the last 168 hours.  Radiological Exams on Admission: Ct Cta Abd/pel W/cm &/or W/o Cm  01/29/2016  CLINICAL DATA:  82 year old male with left-sided abdominal pain and nausea. Left leg pain. EXAM: CTA ABDOMEN AND PELVIS wITHOUT AND WITH CONTRAST TECHNIQUE: Multidetector  CT imaging of the abdomen and pelvis was performed using the standard protocol during bolus administration of intravenous contrast. Multiplanar reconstructed images and MIPs were obtained and reviewed to evaluate the vascular anatomy. CONTRAST:  OMNIPAQUE IOHEXOL 350 MG/ML SOLN COMPARISON:  Noncontrast CT pelvis 07/03/2015. Contrast CT abdomen pelvis 04/06/2012 FINDINGS: Lower chest: Dilatation of the included descending thoracic aorta appears unchanged from chest CT 05/13/2015, maximal dimension 5 cm. There is eccentric mural thrombus. The heart is enlarged. Small bilateral pleural effusions. Subpleural reticular opacities in the lower lobes, concerning for fibrosis. Liver: Scattered hepatic cysts.  No suspicious lesion. Hepatobiliary: Gallbladder physiologically distended, no calcified stone. No biliary dilatation. Pancreas: No ductal dilatation or inflammation. Spleen: Normal. Adrenal glands: No nodule. Kidneys: Diffusely heterogeneous appearance of the left kidney, suggesting pyelonephritis. No hydronephrosis. Small cortical hypodensity in the right kidney, too small to characterize. Stomach/Bowel: Suboptimally assessed without contrast. Stomach physiologically distended with ingested contents. There are no dilated or thickened small bowel loops. Moderate volume of stool throughout the colon without colonic wall thickening. Diffuse colonic tortuosity. The appendix is normal. Vascular/Lymphatic: Aneurysmal dilatation of the infrarenal abdominal aorta. Dilatation begins 1.5 cm from the left renal artery. Maximal aortic dimension of by 4.2 x 4.2 cm progressed from 2013. There is circumferential mural thrombus. 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 Mural thrombus extends to the internal external iliac bifurcation, with the external iliac artery occluded. Aneurysmal dilatation of the right common iliac artery measures 2.4 cm, unchanged from July 2016 CT. There is aneurysmal  dilatation of the right internal iliac artery measuring 2.2 cm, previously 2.0 cm. The proximal right common iliac artery measures 1.6 cm. There is no periaortic  soft tissue stranding to suggest rupture. Stenosis of the origin celiac artery secondary to atherosclerosis. Stenosis of the proximal superior mesenteric artery secondary to atherosclerosis. No abrupt occlusion. The IMA is patent. Two right and single left renal arteries are patent, with atherosclerosis at the origins. No retroperitoneal adenopathy. Reproductive: Prostate gland is enlarged. Bladder: Foley catheter in place.  Bladder wall thickening is seen. Other: No free air, free fluid, or intra-abdominal fluid collection. Musculoskeletal: There are no acute or suspicious osseous abnormalities. Age-related degenerative change in the spine with unilateral left L5 pars defect, no listhesis. Review of the MIP images confirms the above findings. IMPRESSION: 1. No evidence of acute aortic abnormality. Abdominal aortic aneurysm, current maximal dimension 4.2 cm, increased from 3 cm in 2013. There are aneurysms of the descending thoracic aorta, left greater than right common iliac arteries, right internal and external iliac arteries. The descending thoracic aortic aneurysm and left common iliac artery aneurysm have increased from 2013. There is diffuse mural thrombus. The recommended follow-up for in abdominal aortic aneurysm of this size is ultrasound 1 year. This recommendation follows ACR consensus guidelines: White Paper of the ACR Incidental Findings Committee II on Vascular Findings. J Am Coll Radiol 2013; 10:789-794. 2. Stenosis at the origin of the celiac and proximal SMA secondary to atherosclerosis. 3. Heterogeneous enhancement of the left kidney, concerning for pyelonephritis. 4. Lung base findings concerning for pulmonary fibrosis. 5. Additional findings as described. Electronically Signed   By: Rubye Oaks M.D.   On: 01/29/2016 00:06     Assessment/Plan Principal Problem:   Abdominal pain Active Problems:   HTN (hypertension)   Hyperlipidemia   History of CVA (cerebrovascular accident)   CAD (coronary artery disease) of artery bypass graft   Sick sinus syndrome (HCC)   Chronic combined systolic and diastolic CHF (congestive heart failure) (HCC)   Acute renal failure superimposed on stage 3 chronic kidney disease (HCC)   HLD (hyperlipidemia)   Cardiac pacemaker in situ   PAD (peripheral artery disease) (HCC)   BPH (benign prostatic hyperplasia)   H/O endarterectomy   Nausea   UTI (urinary tract infection)   Right leg pain  Abdominal pain, nausea and vomiting: Etiology is not clear. Lipase is normal. Patient has increased urinary frequency, but no dysuria or burning on urination. His urinalysis is negative, but CT angiogram of abdomen/pelvis showed signs of possible pyelonephritis. CT angiogram of abdomen/pelvis showed multiple aneurysm or stenosis, but no signs of rupture. Given his leukocytosis with WBC 14.5, more likely that patient has infection going on, possibly pyelonephritis with negative urinalysis. Another potential differential diagnosis is ischemic bowel given his significant hx of peripheral vascular disease, but patient has normal anion gap, making this diagnosis less likely.  -will admit to tele bed -EDP started rocephin, will continue -f/u urine and blood cutlure -When necessary Zofran for nausea and morphine for pain  Multiple aneurysm and stenosis: As evidenced by the CT angiogram of abdomen/pelvis. No signs of rapture.  -may follow-up with vascular surgeon  Right leg pain and PAD: his leg pain is most likely due to PAD. His right leg pain is known. He is followed up by Dr. early, last seen was 11/25/15. Per Dr. Bosie Helper note, patient should continue his usual walking programs and see back in in 6 months with repeat ankle arm indices. -May follow with Dr. [name] -On ASA and lipitor  CAD: s/p of  CABG and stent. No CP today. -continue ASA, plavix, Lipitor, metoprolol, Imdur and when necessary nitroglycerin  HTN: -continue home amlodipine, metoprolol -Also on Flomax and imdur -IV hydralazine prn.  Hx of stroke: s/p of L ICA stetn -on ASA, plavix  HLD: Last LDL was 50 on 07/06/11 -Continue home medications: Lipitor  Chronic combined systolic and diastolic CHF: 2-D echo on 11/18/14 showed EF 45%. Patient is on Lasix 20 mg daily. No leg edema or JVD. CHF is compensated. -Hold Lasix due to nausea and vomiting, and worsening renal function -Check BNP.  Addendum: BNP comes back 865-->d/c'ed IVF. May restart his lasix in AM if no signs of sepsis (not ordered yet)  AoCKD-III: Baseline Cre is 1.35, his Cre is 1.57 on admission. Likely due to prerenal secondary to dehydration and continuation of diruetics. - IVF: ns  75 cc/h  - Check FeUrea - Follow up renal function by BMP - Hold Lasix.  BPH: stable - Continue Flomax  DVT ppx: SQ Lovenox  Code Status: Full code Family Communication: None at bed side.    Disposition Plan: Admit to inpatient   Date of Service 01/29/2016    Lorretta Harp [facility] Pager [phone]  If 7PM-7AM, please contact night-coverage [email or web address] Password [facility] 01/29/2016, 2:43 AM

## 2016-01-30 DIAGNOSIS — N183 Chronic kidney disease, stage 3 (moderate): Secondary | ICD-10-CM

## 2016-01-30 DIAGNOSIS — E785 Hyperlipidemia, unspecified: Secondary | ICD-10-CM

## 2016-01-30 DIAGNOSIS — I25810 Atherosclerosis of coronary artery bypass graft(s) without angina pectoris: Secondary | ICD-10-CM

## 2016-01-30 DIAGNOSIS — I739 Peripheral vascular disease, unspecified: Secondary | ICD-10-CM

## 2016-01-30 DIAGNOSIS — M79604 Pain in right leg: Secondary | ICD-10-CM

## 2016-01-30 DIAGNOSIS — N179 Acute kidney failure, unspecified: Secondary | ICD-10-CM

## 2016-01-30 DIAGNOSIS — N400 Enlarged prostate without lower urinary tract symptoms: Secondary | ICD-10-CM

## 2016-01-30 LAB — CBC
HCT: 44 % (ref 39.0–52.0)
HEMOGLOBIN: 14.3 g/dL (ref 13.0–17.0)
MCH: 31.5 pg (ref 26.0–34.0)
MCHC: 32.5 g/dL (ref 30.0–36.0)
MCV: 96.9 fL (ref 78.0–100.0)
Platelets: 138 10*3/uL — ABNORMAL LOW (ref 150–400)
RBC: 4.54 MIL/uL (ref 4.22–5.81)
RDW: 15.3 % (ref 11.5–15.5)
WBC: 13 10*3/uL — AB (ref 4.0–10.5)

## 2016-01-30 LAB — BASIC METABOLIC PANEL
ANION GAP: 9 (ref 5–15)
BUN: 24 mg/dL — ABNORMAL HIGH (ref 6–20)
CALCIUM: 8.5 mg/dL — AB (ref 8.9–10.3)
CHLORIDE: 107 mmol/L (ref 101–111)
CO2: 26 mmol/L (ref 22–32)
Creatinine, Ser: 2.31 mg/dL — ABNORMAL HIGH (ref 0.61–1.24)
GFR calc non Af Amer: 24 mL/min — ABNORMAL LOW (ref >60)
GFR, EST AFRICAN AMERICAN: 28 mL/min — AB (ref >60)
Glucose, Bld: 91 mg/dL (ref 65–99)
Potassium: 3.9 mmol/L (ref 3.5–5.1)
Sodium: 142 mmol/L (ref 135–145)

## 2016-01-30 LAB — UREA NITROGEN, URINE: UREA NITROGEN UR: 91 mg/dL

## 2016-01-30 LAB — GLUCOSE, CAPILLARY: Glucose-Capillary: 93 mg/dL (ref 65–99)

## 2016-01-30 LAB — URINE CULTURE: CULTURE: NO GROWTH

## 2016-01-30 MED ORDER — SODIUM CHLORIDE 0.9 % IV SOLN
INTRAVENOUS | Status: DC
  Administered 2016-01-30 – 2016-01-31 (×4): via INTRAVENOUS

## 2016-01-30 NOTE — Progress Notes (Signed)
CSW spoke with patient re: PT/OT evaluation recommending Supervision 24hr; Home Health vs. SNF. Patient informed CSW that he lives with his daughter, [name] & grandson and that someone is with him 24/7. CSW left message with patient's son, Tammy Sours (ph#: [phone]) to confirm. CSW attempted to reach patient's daughter whom he lives with, [name] - though the number listed for her [phone]) is incorrect. RNCM, Kathy aware.   No further CSW needs identified - CSW signing off.   Lincoln Maxin, LCSW [facility] Clinical Social Worker cell #: [phone]

## 2016-01-30 NOTE — Progress Notes (Signed)
[facility] PROGRESS NOTE   [name] WUJ:[id number] DOB: 1927-06-09 DOA: 01/28/2016 PCP: Delorse Lek, MD  HPI/Subjective: Patient is to complain about right thigh pain closer to his groin. Seen with daughter at bedside.  Assessment/Plan: Principal Problem:   Abdominal pain Active Problems:   HTN (hypertension)   Hyperlipidemia   History of CVA (cerebrovascular accident)   CAD (coronary artery disease) of artery bypass graft   Sick sinus syndrome (HCC)   Chronic combined systolic and diastolic CHF (congestive heart failure) (HCC)   Acute renal failure superimposed on stage 3 chronic kidney disease (HCC)   HLD (hyperlipidemia)   Cardiac pacemaker in situ   PAD (peripheral artery disease) (HCC)   BPH (benign prostatic hyperplasia)   H/O endarterectomy   Nausea   UTI (urinary tract infection)   Right leg pain    Likely left-sided pyelonephritis:  Patient presented with nausea, vomiting and abdominal pain. Etiology is not clear. Lipase and urinalysis normal. His urinalysis is negative, but CT angiogram of abdomen/pelvis showed signs of possible pyelonephritis.  CT angiogram of abdomen/pelvis showed multiple aneurysm or stenosis, but no signs of rupture.  Given his leukocytosis with WBC 14.5, more likely that patient has infection going on, possibly pyelonephritis with negative urinalysis. Started on Rocephin, continue antibiotics.  Acute renal failure on CKD stage III Baseline Cre is 1.35, his Cre is 1.57 on admission. Likely due to prerenal secondary to dehydration and continuation of diruetics. Lasix held, his creatinine worsened slightly to 2.3 today. Restarted IV fluids at slow rate at 75 mL/hour  Multiple aneurysm and stenosis: As evidenced by the CT angiogram of abdomen/pelvis. No signs of rapture.  -may follow-up with vascular surgeon  Right leg pain and PAD: his leg pain is most likely due to PAD.  His right leg pain is known. He is followed up by  Dr. [name], last seen was 11/25/15. Per Dr. Bosie Helper note, patient should continue his usual walking programs and see back in in 6 months with repeat ankle arm indices. -Patient reported right groin pain to Dr. [name] when he saw him on 11/25/15  CAD: s/p of CABG and stent. No CP today. -continue ASA, plavix, Lipitor, metoprolol, Imdur and when necessary nitroglycerin  HTN: -continue home amlodipine, metoprolol -Also on Flomax and imdur -IV hydralazine prn.  Hx of stroke: s/p of L ICA stetn -on ASA, plavix  HLD: Last LDL was 50 on 07/06/11 -Continue home medications: Lipitor  Chronic combined systolic and diastolic CHF: 2-D echo on 11/18/14 showed EF 45%. Patient is on Lasix 20 mg daily. No leg edema or JVD. CHF is compensated. -BNP elevated.  BPH: stable - Continue Flomax   Code Status: Full Code Family Communication: Plan discussed with the patient. Disposition Plan: Remains inpatient Diet: Diet Heart Room service appropriate?: Yes; Fluid consistency:: Thin  Consultants:  None   Procedures:  None  Antibiotics:  None    Objective: Filed Vitals:   01/30/16 0529 01/30/16 0949  BP: 130/62 153/83  Pulse: 69 72  Temp: 98 F (36.7 C)   Resp: 20     Intake/Output Summary (Last 24 hours) at 01/30/16 1204 Last data filed at 01/30/16 1200  Gross per 24 hour  Intake    360 ml  Output   1335 ml  Net   -975 ml   Filed Weights   01/29/16 0206 01/30/16 0741  Weight: 60.737 kg (133 lb 14.4 oz) 62.642 kg (138 lb 1.6 oz)    Exam: General: Alert and awake, oriented  x3, not in any acute distress. HEENT: anicteric sclera, pupils reactive to light and accommodation, EOMI CVS: S1-S2 clear, no murmur rubs or gallops Chest: clear to auscultation bilaterally, no wheezing, rales or rhonchi Abdomen: soft nontender, nondistended, normal bowel sounds, no organomegaly Extremities: no cyanosis, clubbing or edema noted bilaterally Neuro: Cranial nerves II-XII intact, no focal  neurological deficits  Data Reviewed: Basic Metabolic Panel:  Recent Labs Lab 01/28/16 2058 01/29/16 0245 01/30/16 0518  NA 142 146* 142  K 3.5 4.2 3.9  CL 108 110 107  CO2 21* 25 26  GLUCOSE 138* 115* 91  BUN 18 17 24*  CREATININE 1.57* 1.70* 2.31*  CALCIUM 8.9 9.1 8.5*   Liver Function Tests:  Recent Labs Lab 01/28/16 2058  AST 25  ALT 9*  ALKPHOS 83  BILITOT 0.4  PROT 7.2  ALBUMIN 3.4*    Recent Labs Lab 01/28/16 2058  LIPASE 22   No results for input(s): AMMONIA in the last 168 hours. CBC:  Recent Labs Lab 01/28/16 2058 01/29/16 0245 01/30/16 0518  WBC 14.5* 12.3* 13.0*  HGB 15.4 15.6 14.3  HCT 45.8 46.4 44.0  MCV 96.0 95.9 96.9  PLT 146* 141* 138*   Cardiac Enzymes: No results for input(s): CKTOTAL, CKMB, CKMBINDEX, TROPONINI in the last 168 hours. BNP (last 3 results)  Recent Labs  01/29/16 0245  BNP 865.0*    ProBNP (last 3 results) No results for input(s): PROBNP in the last 8760 hours.  CBG:  Recent Labs Lab 01/29/16 0742  GLUCAP 90    Micro No results found for this or any previous visit (from the past 240 hour(s)).   Studies: Ct Cta Abd/pel W/cm &/or W/o Cm  01/29/2016  CLINICAL DATA:  82 year old male with left-sided abdominal pain and nausea. Left leg pain. EXAM: CTA ABDOMEN AND PELVIS wITHOUT AND WITH CONTRAST TECHNIQUE: Multidetector CT imaging of the abdomen and pelvis was performed using the standard protocol during bolus administration of intravenous contrast. Multiplanar reconstructed images and MIPs were obtained and reviewed to evaluate the vascular anatomy. CONTRAST:  OMNIPAQUE IOHEXOL 350 MG/ML SOLN COMPARISON:  Noncontrast CT pelvis 07/03/2015. Contrast CT abdomen pelvis 04/06/2012 FINDINGS: Lower chest: Dilatation of the included descending thoracic aorta appears unchanged from chest CT 05/13/2015, maximal dimension 5 cm. There is eccentric mural thrombus. The heart is enlarged. Small bilateral pleural  effusions. Subpleural reticular opacities in the lower lobes, concerning for fibrosis. Liver: Scattered hepatic cysts.  No suspicious lesion. Hepatobiliary: Gallbladder physiologically distended, no calcified stone. No biliary dilatation. Pancreas: No ductal dilatation or inflammation. Spleen: Normal. Adrenal glands: No nodule. Kidneys: Diffusely heterogeneous appearance of the left kidney, suggesting pyelonephritis. No hydronephrosis. Small cortical hypodensity in the right kidney, too small to characterize. Stomach/Bowel: Suboptimally assessed without contrast. Stomach physiologically distended with ingested contents. There are no dilated or thickened small bowel loops. Moderate volume of stool throughout the colon without colonic wall thickening. Diffuse colonic tortuosity. The appendix is normal. Vascular/Lymphatic: Aneurysmal dilatation of the infrarenal abdominal aorta. Dilatation begins 1.5 cm from the left renal artery. Maximal aortic dimension of by 4.2 x 4.2 cm progressed from 2013. There is circumferential mural thrombus. 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 Mural thrombus extends to the internal external iliac bifurcation, with the external iliac artery occluded. Aneurysmal dilatation of the right common iliac artery measures 2.4 cm, unchanged from July 2016 CT. There is aneurysmal dilatation of the right internal iliac artery measuring 2.2 cm, previously 2.0 cm.  The proximal right common iliac artery measures 1.6 cm. There is no periaortic soft tissue stranding to suggest rupture. Stenosis of the origin celiac artery secondary to atherosclerosis. Stenosis of the proximal superior mesenteric artery secondary to atherosclerosis. No abrupt occlusion. The IMA is patent. Two right and single left renal arteries are patent, with atherosclerosis at the origins. No retroperitoneal adenopathy. Reproductive: Prostate gland is enlarged. Bladder: Foley catheter in place.  Bladder  wall thickening is seen. Other: No free air, free fluid, or intra-abdominal fluid collection. Musculoskeletal: There are no acute or suspicious osseous abnormalities. Age-related degenerative change in the spine with unilateral left L5 pars defect, no listhesis. Review of the MIP images confirms the above findings. IMPRESSION: 1. No evidence of acute aortic abnormality. Abdominal aortic aneurysm, current maximal dimension 4.2 cm, increased from 3 cm in 2013. There are aneurysms of the descending thoracic aorta, left greater than right common iliac arteries, right internal and external iliac arteries. The descending thoracic aortic aneurysm and left common iliac artery aneurysm have increased from 2013. There is diffuse mural thrombus. The recommended follow-up for in abdominal aortic aneurysm of this size is ultrasound 1 year. This recommendation follows ACR consensus guidelines: White Paper of the ACR Incidental Findings Committee II on Vascular Findings. J Am Coll Radiol 2013; 10:789-794. 2. Stenosis at the origin of the celiac and proximal SMA secondary to atherosclerosis. 3. Heterogeneous enhancement of the left kidney, concerning for pyelonephritis. 4. Lung base findings concerning for pulmonary fibrosis. 5. Additional findings as described. Electronically Signed   By: Rubye Oaks M.D.   On: 01/29/2016 00:06    Scheduled Meds: . amLODipine  5 mg Oral Daily  . aspirin  81 mg Oral Daily  . atorvastatin  40 mg Oral q1800  . cefTRIAXone (ROCEPHIN)  IV  1 g Intravenous Q24H  . clopidogrel  75 mg Oral QHS  . enoxaparin (LOVENOX) injection  30 mg Subcutaneous Q24H  . isosorbide mononitrate  30 mg Oral Daily  . metoprolol succinate  100 mg Oral Daily  . sodium chloride flush  3 mL Intravenous Q12H  . tamsulosin  0.4 mg Oral PC supper   Continuous Infusions: . sodium chloride 75 mL/hr at 01/30/16 0950       Time spent: 35 minutes    [facility] A  [facility] Pager [phone] If  7PM-7AM, please contact night-coverage at [email or web address], password [facility] 01/30/2016, 12:04 PM  LOS: 1 day

## 2016-01-30 NOTE — Progress Notes (Signed)
MD made aware of patient's decreased urine output- 150 ml in 8 hours. Bladder scan showing only 26 ml remaining in bladder. Patient denies bladder pressure or abdominal pain. New order- Increase IVF to 100 ml/hr. Will continue to monitor.

## 2016-01-30 NOTE — Progress Notes (Signed)
Occupational Therapy Evaluation Patient Details Name: [name] MRN: [id number] DOB: Oct 01, 1927 Today's Date: 01/30/2016    History of Present Illness 82 y.o. male with PMH of carotid artery stenosis (S/P of left internal carotid artery stent), PAD (S/p of right popliteal artery RFPBG and stent), hypertension, hyperlipidemia, COPD, hypothyroidism, CAD, S/P of CABG and the stent, sick sinus syndrome, S/P of pacemaker placement, combined systolic and diastolic congestive heart failure, AAA, BPH, chronic kidney disease-status 3, who presents with nausea, vomiting, abdominal pain and leg pain.   Clinical Impression   Patient presents to OT with decreased ADL independence and safety due to deficits listed below. He will benefit from skilled OT to maximize function and to facilitate a safe discharge. OT will follow. Today's evaluation limited by pain R thigh, seems to spasm every few seconds.     Follow Up Recommendations  Supervision/Assistance - 24 hour;Home health OT vs. SNF (if no 24/7 A available)   Equipment Recommendations  Other (comment) (tbd)    Recommendations for Other Services       Precautions / Restrictions Precautions Precautions: Fall Restrictions Weight Bearing Restrictions: No      Mobility Bed Mobility                  Transfers                      Balance                                            ADL Overall ADL's : Needs assistance/impaired Eating/Feeding: Independent;Bed level   Grooming: Set up;Bed level   Upper Body Bathing: Set up;Sitting;Bed level   Lower Body Bathing: Minimal assistance;Bed level   Upper Body Dressing : Set up;Bed level;Sitting   Lower Body Dressing: Minimal assistance;Bed level                 General ADL Comments: Patient received in bed. Appears to have muscle spasm R thigh every few seconds, as patient grimaces with each one. Able to come into long sitting independently and  can reach feet from that position. Patient reports he was in chair earlier but does not want to get back OOB right now due to pain. OT will follow.     Vision     Perception     Praxis      Pertinent Vitals/Pain Pain Assessment: 0-10 Pain Score: 10-Worst pain ever Pain Location: R thigh Pain Descriptors / Indicators: Stabbing;Spasm Pain Intervention(s): Limited activity within patient's tolerance;Monitored during session     Hand Dominance Right   Extremity/Trunk Assessment Upper Extremity Assessment Upper Extremity Assessment: Overall WFL for tasks assessed   Lower Extremity Assessment Lower Extremity Assessment: Defer to PT evaluation       Communication Communication Communication: No difficulties   Cognition Arousal/Alertness: Awake/alert Behavior During Therapy: WFL for tasks assessed/performed Overall Cognitive Status: Within Functional Limits for tasks assessed                     General Comments       Exercises       Shoulder Instructions      Home Living Family/patient expects to be discharged to:: Private residence Living Arrangements: Alone Available Help at Discharge: Family;Available 24 hours/day Type of Home: House Home Access: Stairs to enter [employer] of Steps: 2 Entrance  Stairs-Rails: Left Home Layout: One level     Bathroom Shower/Tub: Chief Strategy Officer: Handicapped height Bathroom Accessibility: Yes   Home Equipment: Walker - 2 wheels;Wheelchair - manual;Bedside commode;Cane - single point          Prior Functioning/Environment Level of Independence: Independent        Comments: 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  pt lost his wife  ~2 yrs ago.      OT Diagnosis: Acute pain   OT Problem List: Decreased activity tolerance;Decreased knowledge of use of DME or AE;Pain   OT Treatment/Interventions: Self-care/ADL training;DME and/or AE  instruction;Therapeutic activities;Patient/family education    OT Goals(Current goals can be found in the care plan section) Acute Rehab OT Goals Patient Stated Goal: to get rid of this pain OT Goal Formulation: With patient Time For Goal Achievement: 02/13/16 Potential to Achieve Goals: Good  OT Frequency: Min 2X/week   Barriers to D/C:            Co-evaluation              End of Session    Activity Tolerance: Patient limited by pain Patient left: in bed;with call bell/phone within reach;with bed alarm set   Time: 4782-9562 OT Time Calculation (min): 13 min Charges:  OT General Charges $OT Visit: 1 Procedure OT Evaluation $OT Eval Moderate Complexity: 1 Procedure G-Codes:    Maryagnes Carrasco A 2016-01-31, 1:53 PM

## 2016-01-31 DIAGNOSIS — Z9889 Other specified postprocedural states: Secondary | ICD-10-CM

## 2016-01-31 DIAGNOSIS — Z8673 Personal history of transient ischemic attack (TIA), and cerebral infarction without residual deficits: Secondary | ICD-10-CM

## 2016-01-31 DIAGNOSIS — I10 Essential (primary) hypertension: Secondary | ICD-10-CM

## 2016-01-31 DIAGNOSIS — I5042 Chronic combined systolic (congestive) and diastolic (congestive) heart failure: Secondary | ICD-10-CM

## 2016-01-31 LAB — MAGNESIUM: MAGNESIUM: 2.1 mg/dL (ref 1.7–2.4)

## 2016-01-31 LAB — BASIC METABOLIC PANEL
Anion gap: 9 (ref 5–15)
BUN: 32 mg/dL — AB (ref 6–20)
CHLORIDE: 110 mmol/L (ref 101–111)
CO2: 23 mmol/L (ref 22–32)
CREATININE: 2.12 mg/dL — AB (ref 0.61–1.24)
Calcium: 8.1 mg/dL — ABNORMAL LOW (ref 8.9–10.3)
GFR calc Af Amer: 31 mL/min — ABNORMAL LOW (ref >60)
GFR calc non Af Amer: 27 mL/min — ABNORMAL LOW (ref >60)
Glucose, Bld: 87 mg/dL (ref 65–99)
POTASSIUM: 3.6 mmol/L (ref 3.5–5.1)
Sodium: 142 mmol/L (ref 135–145)

## 2016-01-31 LAB — GLUCOSE, CAPILLARY: GLUCOSE-CAPILLARY: 81 mg/dL (ref 65–99)

## 2016-01-31 MED ORDER — DEXTROSE 5 % IV SOLN
1.0000 g | INTRAVENOUS | Status: DC
  Administered 2016-01-31: 1 g via INTRAVENOUS
  Filled 2016-01-31: qty 10

## 2016-01-31 NOTE — Progress Notes (Signed)
[facility] PROGRESS NOTE   [name] [id number]:[id number] DOB: [birth year] DOA: 01/28/2016 PCP: Delorse Lek, MD  HPI/Subjective: Feels much better, right groin the pain is 3/10 in intensity. Denies any other complaints, his creatinine is still elevated at 2.1.  Assessment/Plan: Principal Problem:   Abdominal pain Active Problems:   HTN (hypertension)   Hyperlipidemia   History of CVA (cerebrovascular accident)   CAD (coronary artery disease) of artery bypass graft   Sick sinus syndrome (HCC)   Chronic combined systolic and diastolic CHF (congestive heart failure) (HCC)   Acute renal failure superimposed on stage 3 chronic kidney disease (HCC)   HLD (hyperlipidemia)   Cardiac pacemaker in situ   PAD (peripheral artery disease) (HCC)   BPH (benign prostatic hyperplasia)   H/O endarterectomy   Nausea   UTI (urinary tract infection)   Right leg pain    Likely left-sided pyelonephritis:  Patient presented with nausea, vomiting and abdominal pain. Etiology is not clear. Lipase and urinalysis normal. His urinalysis is negative, but CT angiogram of abdomen/pelvis showed signs of possible pyelonephritis.  CT angiogram of abdomen/pelvis showed multiple aneurysm or stenosis, but no signs of rupture.  Given his leukocytosis with WBC 14.5, more likely that patient has infection going on, possibly pyelonephritis with negative urinalysis. Started on Rocephin, continue antibiotics. Feels much better, check CBC and BMP in a.m.  Acute renal failure on CKD stage III Baseline Cre is 1.35, his Cre is 1.57 on admission. Likely due to prerenal secondary to dehydration and continuation of diruetics. Lasix held, his creatinine worsened slightly to 2.3 today. Restarted on IV fluids, creatinine improved to 2.1 today, follow intake/output closely and check BMP in a.m.  Multiple aneurysm and stenosis: As evidenced by the CT angiogram of abdomen/pelvis. No signs of rapture.  -may follow-up  with vascular surgeon  Right leg pain and PAD: his leg pain is most likely due to PAD.  His right leg pain is known. He is followed up by Dr. [name], last seen was 11/25/15. Per Dr. Bosie Helper note, patient should continue his usual walking programs and see back in in 6 months with repeat ankle arm indices. -Patient reported right groin pain to Dr. [name] when he saw him on 11/25/15  CAD: s/p of CABG and stent. No CP today. -continue ASA, plavix, Lipitor, metoprolol, Imdur and when necessary nitroglycerin  HTN: -continue home amlodipine, metoprolol -Also on Flomax and imdur -IV hydralazine prn.  Hx of stroke: s/p of L ICA stent -on ASA, plavix  HLD: Last LDL was 50 on 07/06/11 -Continue home medications: Lipitor  Chronic combined systolic and diastolic CHF: 2-D echo on 11/18/14 showed EF 45%. Patient is on Lasix 20 mg daily. No leg edema or JVD. CHF is compensated. -BNP elevated.  BPH: stable - Continue Flomax   Code Status: Full Code Family Communication: Plan discussed with the patient. Disposition Plan: Ambulate, if patient creatinine improved can be discharged in a.m. Diet: Diet Heart Room service appropriate?: Yes; Fluid consistency:: Thin  Consultants:  None   Procedures:  None  Antibiotics:  None    Objective: Filed Vitals:   01/30/16 2004 01/31/16 0635  BP: 151/71 155/78  Pulse: 75 71  Temp: 98 F (36.7 C) 98.2 F (36.8 C)  Resp: 18 18    Intake/Output Summary (Last 24 hours) at 01/31/16 1135 Last data filed at 01/31/16 1030  Gross per 24 hour  Intake 2171.25 ml  Output   1050 ml  Net 1121.25 ml   [employer]  01/29/16 0206 01/30/16 0741 01/31/16 0635  Weight: 60.737 kg (133 lb 14.4 oz) 62.642 kg (138 lb 1.6 oz) 60.9 kg (134 lb 4.2 oz)    Exam: General: Alert and awake, oriented x3, not in any acute distress. HEENT: anicteric sclera, pupils reactive to light and accommodation, EOMI CVS: S1-S2 clear, no murmur rubs or gallops Chest: clear to  auscultation bilaterally, no wheezing, rales or rhonchi Abdomen: soft nontender, nondistended, normal bowel sounds, no organomegaly Extremities: no cyanosis, clubbing or edema noted bilaterally Neuro: Cranial nerves II-XII intact, no focal neurological deficits  Data Reviewed: Basic Metabolic Panel:  Recent Labs Lab 01/28/16 2058 01/29/16 0245 01/30/16 0518 01/31/16 0532  NA 142 146* 142 142  K 3.5 4.2 3.9 3.6  CL 108 110 107 110  CO2 21* GLUCOSE 138* 115* 91 87  BUN 18 17 24* 32*  CREATININE 1.57* 1.70* 2.31* 2.12*  CALCIUM 8.9 9.1 8.5* 8.1*  MG  --   --   --  2.1   Liver Function Tests:  Recent Labs Lab 01/28/16 2058  AST 25  ALT 9*  ALKPHOS 83  BILITOT 0.4  PROT 7.2  ALBUMIN 3.4*    Recent Labs Lab 01/28/16 2058  LIPASE 22   No results for input(s): AMMONIA in the last 168 hours. CBC:  Recent Labs Lab 01/28/16 2058 01/29/16 0245 01/30/16 0518  WBC 14.5* 12.3* 13.0*  HGB 15.4 15.6 14.3  HCT 45.8 46.4 44.0  MCV 96.0 95.9 96.9  PLT 146* 141* 138*   Cardiac Enzymes: No results for input(s): CKTOTAL, CKMB, CKMBINDEX, TROPONINI in the last 168 hours. BNP (last 3 results)  Recent Labs  01/29/16 0245  BNP 865.0*    ProBNP (last 3 results) No results for input(s): PROBNP in the last 8760 hours.  CBG:  Recent Labs Lab 01/29/16 0742 01/30/16 0739 01/31/16 0757  GLUCAP 90 93 81    Micro Recent Results (from the past 240 hour(s))  Urine culture     Status: None   Collection Time: 01/28/16 10:05 PM  Result Value Ref Range Status   Specimen Description URINE, CATHETERIZED  Final   Special Requests NONE  Final   Culture   Final    NO GROWTH 2 DAYS Performed at [facility]    Report Status 01/30/2016 FINAL  Final     Studies: No results found.  Scheduled Meds: . amLODipine  5 mg Oral Daily  . aspirin  81 mg Oral Daily  . atorvastatin  40 mg Oral q1800  . cefTRIAXone (ROCEPHIN)  IV  1 g Intravenous Q24H  .  clopidogrel  75 mg Oral QHS  . enoxaparin (LOVENOX) injection  30 mg Subcutaneous Q24H  . isosorbide mononitrate  30 mg Oral Daily  . metoprolol succinate  100 mg Oral Daily  . sodium chloride flush  3 mL Intravenous Q12H  . tamsulosin  0.4 mg Oral PC supper   Continuous Infusions: . sodium chloride 100 mL/hr at 01/30/16 1711       Time spent: 35 minutes    [facility] A  [facility] Pager [phone] If 7PM-7AM, please contact night-coverage at [email or web address], password [facility] 01/31/2016, 11:35 AM  LOS: 2 days

## 2016-01-31 NOTE — Progress Notes (Signed)
Patient had a 10 beat run of VTach. Patient was resting. On call notified and new orders put in to check a mag level in the morning.

## 2016-02-01 DIAGNOSIS — R110 Nausea: Secondary | ICD-10-CM

## 2016-02-01 LAB — CBC
HCT: 40.9 % (ref 39.0–52.0)
Hemoglobin: 13.4 g/dL (ref 13.0–17.0)
MCH: 31.8 pg (ref 26.0–34.0)
MCHC: 32.8 g/dL (ref 30.0–36.0)
MCV: 96.9 fL (ref 78.0–100.0)
PLATELETS: 129 10*3/uL — AB (ref 150–400)
RBC: 4.22 MIL/uL (ref 4.22–5.81)
RDW: 15.1 % (ref 11.5–15.5)
WBC: 8.8 10*3/uL (ref 4.0–10.5)

## 2016-02-01 LAB — BASIC METABOLIC PANEL
Anion gap: 8 (ref 5–15)
BUN: 26 mg/dL — AB (ref 6–20)
CALCIUM: 8.3 mg/dL — AB (ref 8.9–10.3)
CO2: 23 mmol/L (ref 22–32)
CREATININE: 1.72 mg/dL — AB (ref 0.61–1.24)
Chloride: 112 mmol/L — ABNORMAL HIGH (ref 101–111)
GFR calc Af Amer: 40 mL/min — ABNORMAL LOW (ref >60)
GFR, EST NON AFRICAN AMERICAN: 34 mL/min — AB (ref >60)
Glucose, Bld: 89 mg/dL (ref 65–99)
POTASSIUM: 3.5 mmol/L (ref 3.5–5.1)
SODIUM: 143 mmol/L (ref 135–145)

## 2016-02-01 LAB — GLUCOSE, CAPILLARY: Glucose-Capillary: 84 mg/dL (ref 65–99)

## 2016-02-01 MED ORDER — CEFUROXIME AXETIL 500 MG PO TABS
500.0000 mg | ORAL_TABLET | Freq: Two times a day (BID) | ORAL | Status: DC
Start: 2016-02-01 — End: 2016-03-11

## 2016-02-01 NOTE — Discharge Summary (Signed)
Physician Discharge Summary  [name] UJW:[id number] DOB: 1927-06-01 DOA: 01/28/2016  PCP: Delorse Lek, MD  Admit date: 01/28/2016 Discharge date: 02/01/2016  Time spent: 40 minutes  Recommendations for Outpatient Follow-up:  1. Follow-up with primary care physician in one week. 2. Continue Ceftin for 10 more days. 3. Check BMP in 1 week   Discharge Diagnoses:  Principal Problem:   Abdominal pain Active Problems:   HTN (hypertension)   Hyperlipidemia   History of CVA (cerebrovascular accident)   CAD (coronary artery disease) of artery bypass graft   Sick sinus syndrome (HCC)   Chronic combined systolic and diastolic CHF (congestive heart failure) (HCC)   Acute renal failure superimposed on stage 3 chronic kidney disease (HCC)   HLD (hyperlipidemia)   Cardiac pacemaker in situ   PAD (peripheral artery disease) (HCC)   BPH (benign prostatic hyperplasia)   H/O endarterectomy   Nausea   UTI (urinary tract infection)   Right leg pain   Discharge Condition: Stable  Diet recommendation: Heart healthy  Filed Weights   01/30/16 0741 01/31/16 0635 02/01/16 0615  Weight: 62.642 kg (138 lb 1.6 oz) 60.9 kg (134 lb 4.2 oz) 62.3 kg (137 lb 5.6 oz)    History of present illness:  [name] is a 82 y.o. male with PMH of carotid artery stenosis (S/P of left internal carotid artery stent), PAD (S/p of right popliteal artery RFPBG and stent), hypertension, hyperlipidemia, COPD, hypothyroidism, CAD, S/P of CABG and the stent, sick sinus syndrome, S/P of pacemaker placement, combined systolic and diastolic congestive heart failure, AAA, BPH, chronic kidney disease-status 3, who presents with nausea, vomiting, abdominal pain and leg pain. Patient reports that he started having nausea, vomiting and abdominal pain today. He vomited twice without blood in the vomitus. No diarrhea. His abdominal pain is located in the left upper quadrant, constant, nonradiating, initially severe, currently  minimal. Patient does not have fever or chills. He had urinary retention in ED and Foley was placed. He also reports right leg pain, which has been going on for more than 2 months. His right leg pain is located in the upper medial thigh area. It is intermittent, worse in the night, and better during the daytime. There is no tenderness over calf area. Patient does not have chest pain, shortness of breath, unilateral weakness. He has increased urinary frequency, but no burning or dysuria.  In ED, patient was found to have WBC 14.5, negative urinalysis, lipase 22, temperature normal, no tachycardia, worsening renal function. Patient's admitted to inpatient for further interventional treatment.  Hospital Course:   Likely left-sided pyelonephritis:  Patient presented with nausea, vomiting and abdominal pain. Etiology is not clear. Lipase and urinalysis normal. His urinalysis is negative, but CT angiogram of abdomen/pelvis showed signs of possible left-sided pyelonephritis.  CT angiogram of abdomen/pelvis showed multiple aneurysm or stenosis, but no signs of rupture.  Given his leukocytosis with WBC 14.5, more likely that patient has infection going on, possibly pyelonephritis with negative urinalysis. Started on Rocephin, continue antibiotics. Felt much better after initiation of antibiotics, treat with Ceftin empirically to complete 14 days of antibiotics.  Acute renal failure on CKD stage III Creatinine is 1.57 on admission. Likely due to prerenal secondary to dehydration and continuation of diruetics. Lasix held, his creatinine worsened slightly to 2.3. Started IV fluids, creatinine down to 1.7, he is asking to be discharged today. Ask him to hold Lasix for a couple of more days and keep himself hydrated.  Multiple aneurysm and stenosis:  As evidenced by the CT angiogram of abdomen/pelvis. No signs of rapture.  -may follow-up with vascular surgeon  Right leg pain and PAD:  He has known  right lower extremity pain, the pain is just under his right inguinal area. He is followed up by Dr. [name], last seen was 11/25/15. Per Dr. Bosie Helper note, patient should continue his usual walking programs and see back in in 6 months with repeat ankle arm indices. -Patient reported right groin pain to Dr. [name] when he saw him on 11/25/15  CAD: s/p of CABG and stent. No CP today. -continue ASA, plavix, Lipitor, metoprolol, Imdur and when necessary nitroglycerin  HTN: -All medications continued including amlodipine, metoprolol and Imdur.  Hx of stroke: s/p of L ICA stent -on ASA, plavix  HLD: Last LDL was 50 on 07/06/11 -Continue home medications: Lipitor  Chronic combined systolic and diastolic CHF: 2-D echo on 11/18/14 showed EF 45%. Patient is on Lasix 20 mg daily. No leg edema or JVD. CHF is compensated. -BNP elevated.  BPH: stable - Continue Flomax   Procedures:  None  Consultations:  None  Discharge Exam: Filed Vitals:   01/31/16 2100 02/01/16 0615  BP: 162/83 147/75  Pulse: 71 71  Temp: 97.8 F (36.6 C) 98.2 F (36.8 C)  Resp: 18 18  General: Alert and awake, oriented x3, not in any acute distress. HEENT: anicteric sclera, pupils reactive to light and accommodation, EOMI CVS: S1-S2 clear, no murmur rubs or gallops Chest: clear to auscultation bilaterally, no wheezing, rales or rhonchi Abdomen: soft nontender, nondistended, normal bowel sounds, no organomegaly Extremities: no cyanosis, clubbing or edema noted bilaterally Neuro: Cranial nerves II-XII intact, no focal neurological deficits  Discharge Instructions   Discharge Instructions    Diet - low sodium heart healthy    Complete by:  As directed      Increase activity slowly    Complete by:  As directed           Current Discharge Medication List    START taking these medications   Details  cefUROXime (CEFTIN) 500 MG tablet Take 1 tablet (500 mg total) by mouth 2 (two) times daily with a  meal. Qty: 20 tablet, Refills: 0      CONTINUE these medications which have NOT CHANGED   Details  acetaminophen (TYLENOL) 325 MG tablet Take 650 mg by mouth every 6 (six) hours as needed. Reported on 11/25/2015    amLODipine (NORVASC) 5 MG tablet TAKE 1 TABLET (5 MG TOTAL) BY MOUTH DAILY. Qty: 30 tablet, Refills: 9    aspirin 81 MG tablet Take 81 mg by mouth daily.    atorvastatin (LIPITOR) 40 MG tablet Take 1 tablet (40 mg total) by mouth daily at 6 PM. Qty: 30 tablet, Refills: 5    clopidogrel (PLAVIX) 75 MG tablet Take 1 tablet (75 mg total) by mouth at bedtime. Resume in 1 week.    furosemide (LASIX) 20 MG tablet Take 1 tablet (20 mg total) by mouth daily. Qty: 30 tablet, Refills: 3    isosorbide mononitrate (IMDUR) 30 MG 24 hr tablet TAKE 1 TABLET (30 MG TOTAL) BY MOUTH DAILY. Qty: 30 tablet, Refills: 8    metoprolol succinate (TOPROL-XL) 100 MG 24 hr tablet Take 100 mg by mouth daily. Take with or immediately following a meal.    nitroGLYCERIN (NITROSTAT) 0.4 MG SL tablet Place 1 tablet (0.4 mg total) under the tongue every 5 (five) minutes as needed. For chest pain Qty: 25 tablet, Refills: 3  potassium chloride (K-DUR) 10 MEQ tablet Take 1 tablet (10 mEq total) by mouth daily. Qty: 90 tablet, Refills: 3    Tamsulosin HCl (FLOMAX) 0.4 MG CAPS Take 0.4 mg by mouth daily after supper.        Allergies  Allergen Reactions  . Pravachol Other (See Comments)    myalgias  . Simvastatin Other (See Comments)    myalgia  . Other Other (See Comments)    GREEN BEANS AND ONIONS  . Oxycodone Other (See Comments)    Family member reports "he was highly agitated and disoriented"   . Terazosin Other (See Comments)    unknown   Follow-up Information    Follow up with BURNETT,BRENT A, MD In 1 week.   Specialty:  Family Medicine   Contact information:   [address] [place] [address] [phone]        The results of significant diagnostics  from this hospitalization (including imaging, microbiology, ancillary and laboratory) are listed below for reference.    Significant Diagnostic Studies: Ct Cta Abd/pel W/cm &/or W/o Cm  01/29/2016  CLINICAL DATA:  82 year old male with left-sided abdominal pain and nausea. Left leg pain. EXAM: CTA ABDOMEN AND PELVIS wITHOUT AND WITH CONTRAST TECHNIQUE: Multidetector CT imaging of the abdomen and pelvis was performed using the standard protocol during bolus administration of intravenous contrast. Multiplanar reconstructed images and MIPs were obtained and reviewed to evaluate the vascular anatomy. CONTRAST:  OMNIPAQUE IOHEXOL 350 MG/ML SOLN COMPARISON:  Noncontrast CT pelvis 07/03/2015. Contrast CT abdomen pelvis 04/06/2012 FINDINGS: Lower chest: Dilatation of the included descending thoracic aorta appears unchanged from chest CT 05/13/2015, maximal dimension 5 cm. There is eccentric mural thrombus. The heart is enlarged. Small bilateral pleural effusions. Subpleural reticular opacities in the lower lobes, concerning for fibrosis. Liver: Scattered hepatic cysts.  No suspicious lesion. Hepatobiliary: Gallbladder physiologically distended, no calcified stone. No biliary dilatation. Pancreas: No ductal dilatation or inflammation. Spleen: Normal. Adrenal glands: No nodule. Kidneys: Diffusely heterogeneous appearance of the left kidney, suggesting pyelonephritis. No hydronephrosis. Small cortical hypodensity in the right kidney, too small to characterize. Stomach/Bowel: Suboptimally assessed without contrast. Stomach physiologically distended with ingested contents. There are no dilated or thickened small bowel loops. Moderate volume of stool throughout the colon without colonic wall thickening. Diffuse colonic tortuosity. The appendix is normal. Vascular/Lymphatic: Aneurysmal dilatation of the infrarenal abdominal aorta. Dilatation begins 1.5 cm from the left renal artery. Maximal aortic dimension of by 4.2 x  4.2 cm progressed from 2013. There is circumferential mural thrombus. 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 Mural thrombus extends to the internal external iliac bifurcation, with the external iliac artery occluded. Aneurysmal dilatation of the right common iliac artery measures 2.4 cm, unchanged from July 2016 CT. There is aneurysmal dilatation of the right internal iliac artery measuring 2.2 cm, previously 2.0 cm. The proximal right common iliac artery measures 1.6 cm. There is no periaortic soft tissue stranding to suggest rupture. Stenosis of the origin celiac artery secondary to atherosclerosis. Stenosis of the proximal superior mesenteric artery secondary to atherosclerosis. No abrupt occlusion. The IMA is patent. Two right and single left renal arteries are patent, with atherosclerosis at the origins. No retroperitoneal adenopathy. Reproductive: Prostate gland is enlarged. Bladder: Foley catheter in place.  Bladder wall thickening is seen. Other: No free air, free fluid, or intra-abdominal fluid collection. Musculoskeletal: There are no acute or suspicious osseous abnormalities. Age-related degenerative change in the  spine with unilateral left L5 pars defect, no listhesis. Review of the MIP images confirms the above findings. IMPRESSION: 1. No evidence of acute aortic abnormality. Abdominal aortic aneurysm, current maximal dimension 4.2 cm, increased from 3 cm in 2013. There are aneurysms of the descending thoracic aorta, left greater than right common iliac arteries, right internal and external iliac arteries. The descending thoracic aortic aneurysm and left common iliac artery aneurysm have increased from 2013. There is diffuse mural thrombus. The recommended follow-up for in abdominal aortic aneurysm of this size is ultrasound 1 year. This recommendation follows ACR consensus guidelines: White Paper of the ACR Incidental Findings Committee II on Vascular Findings. J Am  Coll Radiol 2013; 10:789-794. 2. Stenosis at the origin of the celiac and proximal SMA secondary to atherosclerosis. 3. Heterogeneous enhancement of the left kidney, concerning for pyelonephritis. 4. Lung base findings concerning for pulmonary fibrosis. 5. Additional findings as described. Electronically Signed   By: Rubye Oaks M.D.   On: 01/29/2016 00:06    Microbiology: Recent Results (from the past 240 hour(s))  Urine culture     Status: None   Collection Time: 01/28/16 10:05 PM  Result Value Ref Range Status   Specimen Description URINE, CATHETERIZED  Final   Special Requests NONE  Final   Culture   Final    NO GROWTH 2 DAYS Performed at [facility]    Report Status 01/30/2016 FINAL  Final     Labs: Basic Metabolic Panel:  Recent Labs Lab 01/28/16 2058 01/29/16 0245 01/30/16 0518 01/31/16 0532 02/01/16 0523  NA 142 146* 142 142 143  K 3.5 4.2 3.9 3.6 3.5  CL 108 110 107 110 112*  CO2 21* 25 26 23 23   GLUCOSE 138* 115* 91 87 89  BUN 18 17 24* 32* 26*  CREATININE 1.57* 1.70* 2.31* 2.12* 1.72*  CALCIUM 8.9 9.1 8.5* 8.1* 8.3*  MG  --   --   --  2.1  --    Liver Function Tests:  Recent Labs Lab 01/28/16 2058  AST 25  ALT 9*  ALKPHOS 83  BILITOT 0.4  PROT 7.2  ALBUMIN 3.4*    Recent Labs Lab 01/28/16 2058  LIPASE 22   No results for input(s): AMMONIA in the last 168 hours. CBC:  Recent Labs Lab 01/28/16 2058 01/29/16 0245 01/30/16 0518 02/01/16 0523  WBC 14.5* 12.3* 13.0* 8.8  HGB 15.4 15.6 14.3 13.4  HCT 45.8 46.4 44.0 40.9  MCV 96.0 95.9 96.9 96.9  PLT 146* 141* 138* 129*   Cardiac Enzymes: No results for input(s): CKTOTAL, CKMB, CKMBINDEX, TROPONINI in the last 168 hours. BNP: BNP (last 3 results)  Recent Labs  01/29/16 0245  BNP 865.0*    ProBNP (last 3 results) No results for input(s): PROBNP in the last 8760 hours.  CBG:  Recent Labs Lab 01/29/16 0742 01/30/16 0739 01/31/16 0757 02/01/16 0811  GLUCAP 90 93  81 84       Signed:  Deontez Klinke A MD.  [facility] 02/01/2016, 10:19 AM

## 2016-02-02 ENCOUNTER — Ambulatory Visit (INDEPENDENT_AMBULATORY_CARE_PROVIDER_SITE_OTHER): Payer: Medicare Other | Admitting: Cardiology

## 2016-02-02 ENCOUNTER — Encounter: Payer: Self-pay | Admitting: Cardiology

## 2016-02-02 VITALS — BP 124/60 | HR 80 | Ht 69.0 in | Wt 143.0 lb

## 2016-02-02 DIAGNOSIS — I25119 Atherosclerotic heart disease of native coronary artery with unspecified angina pectoris: Secondary | ICD-10-CM

## 2016-02-02 DIAGNOSIS — I5042 Chronic combined systolic (congestive) and diastolic (congestive) heart failure: Secondary | ICD-10-CM | POA: Diagnosis not present

## 2016-02-02 DIAGNOSIS — I10 Essential (primary) hypertension: Secondary | ICD-10-CM

## 2016-02-02 DIAGNOSIS — M79604 Pain in right leg: Secondary | ICD-10-CM

## 2016-02-02 NOTE — Progress Notes (Signed)
Patient ID: [name], male   DOB: [birth year], 82 y.o.   MRN: [id number] Patient ID: [name], male   DOB: Sep 07, 1927, 82 y.o.   MRN: [id number]      Cardiology Office Note   Date:  02/02/2016   ID:  [name], DOB 20-Mar-1927, MRN [id number]  PCP:  Delorse Lek, MD  Cardiologist:  Lars Masson, MD   Chief complain: post hospitalization follow up   History of Present Illness: [name] is a 82 y.o. male with a h/o symptomatic sinus bradycardia and syncope sp PPM (MDT) by Dr Jenne Campus with generator change 08/2010 by the [facility], seen by Dr [name] on 12/13/2013. His past medical history is significant for CAD, s/p CABG, PVD, CVA, COPD, and hypothyroidism.  He remains very active for his age and has no functional limitations. Unfortunately, he continues to smoke and has not desire to quit. He is grieving his spouse who died several years ago (after 59 years and 1 day of marriage). He appears to live alone now.  He was admitted on 12/30/2013 with acute bronchitis and NSTEMI with maximum TnI 5.03 in the settings of an acute infection. In hospital Lexiscan showed mid and basal inferolateral scar with peri-infarct ischemia.  The patient is coming 1 week after discharge, he complains of profound fatigue and DOE. He states that he felt like this before his CABG surgery.   His stress test was positive for inferolateral scar with some reversibility and underwent cardiac cath on 01/17/14 with findings of severe disease in body of SVG to OM  S/p successful PTCA/bare metal stent x 1 mid body of SVG to OM.  Today the patient states that he feels great, he is back to repairing cars and denies any chest pain, shortness of breath or fatigue that was his main presentation before. He has orthostatic hypotension has also resolved. He denies any palpitations or syncope. His only complaint today is mild lower extremity edema that is up to his knees.  07/20/2014 - the patient complains of dizzy spells,  he recently had a syncopal episode at the [facility] when he was waiting in line. No palpitations, no chest pain or shortness of breath. He stays active. He is compliant with his meds and buttocks all of them at night.  07/23/15 - the patient underwent a right s/p right external iliac, common femoral and profundus endarterectomy with dacron patch angioplasty on 7/201/6. The patient notes resolution of right lower extremity rest pain. He does complain of a burning and stinging sensation to right anterior thigh. He was readmitted by the medical service post-operatively on 07/03/15 for penile bleeding. His groin incision was inspected with no evidence of bleeding and hematoma. His CT scan did show small hematoma at site of surgery but was not concerning. Urology was consulted and felt that his bleeding was secondary to traumatic removal of foley by patient in the setting of plavix. His penile bleeding has since resolved.    02/01/2015 - the patient was just discharged from the hospital yesterday for nausea/vomiting, weakness and was found to have acute on chronic kidney failure sec to dehydration with Crea 2.3 from baseline 1.7 , now back to 1.7 at discharge. Today he states that he still feels weak, but denies chest pain, no claudications, no LE edema. No orthopnea, PND. No palpitations or syncope. He is complaining of an acute pain at the inner part of his right thigh.   Past Medical History  Diagnosis Date  . Hypertension   .  CAD (coronary artery disease)     a. s/p MI in 1988;  b. s/p CABG in 2000 (Dr. Barry Dienes) x 4, LIMA to LAD, SVG to diagonal, SVG to [facility], SVG to PDA;  c. 12/2013 NSTEMI/abnl MV;  d. 01/2014 Cath/PCI: native 3VD, VG->RCA 100, VG->OM aneurysmal 40ost/m, 100m(5.0x12 Veriflex BMS), VG->Diag aneurysmal, LIMA->LAD nl.  . TIA (transient ischemic attack)   . Hyperlipidemia   . COPD (chronic obstructive pulmonary disease) (HCC)   . CVA (cerebral infarction) 07/2011    Remote left brain infarct  with sensory deficits and had a left internal carotid stent placed by Dr. [name] at that time, no recurrent CVA or TIAs or CNS events.  Marland Kitchen PAD (peripheral artery disease) (HCC)     Stable. Had a past RFPBG by Dr. [name] in 2009. this graft is occluded with reconstitution in the popliteal artery, which was patent. Right tibial was occluded and 2-vessel runoff on angiography done by [name] in 12/2010.  . Sick sinus syndrome (HCC)     PPM implanted for this and syncope   . Chronic systolic CHF (congestive heart failure) (HCC)     Hospitalization for CHF from 05/04/13-05/06/13. Treated medically. Had possible pneumonia with short course of antibiotics.  . Abdominal bruit     Loud  . AAA (abdominal aortic aneurysm) (HCC)     Has a known AAA of 3.2 x 3.4 cm by abdominal untrasound 03/27/12. Followup abdominal ultrasound on 03/29/13 showed a diameter of 3.6 cm, which is stable  . Peripheral vascular disease (HCC)   . Hypothyroid     On supplement "don't know if I take RX or not for my thyroid" (01/16/2014)  . Stroke (HCC)   . Syncope     tilt table test/8.10.01/orthostatic hypotension  . Orthostatic hypotension   . Presence of permanent cardiac pacemaker   . Pneumonia   . Headache     Past Surgical History  Procedure Laterality Date  . Femoral bypass Right ?2001  . Carotid stent Left 06/23/11    By [name]  . Coronary artery bypass graft  2000    1st in 2000 (Dr. Barry Dienes) x 4, LIMA to LAD, SVG to diagonal, SVG to OM4, SVG to PDA. Re-cath in May 2012 with patent LIMA to LAD, patent SVG to diagonal, patent SVG to OM with left-to-right collaterals to an occluded right.  . Coronary angioplasty with stent placement  2001-01/16/2014    "think today makes #4" (01/16/2014)  . Cardiac catheterization  2000  . Inguinal hernia repair Right 1980's  . Insert / replace / remove pacemaker      implanted 2003 by Dr Jenne Campus with gen change (MDT ADDRL1) [facility]  . Cataract extraction w/ intraocular  lens  implant, bilateral Bilateral   . Abdominal aortagram N/A 02/22/2012    Procedure: ABDOMINAL AORTAGRAM;  Surgeon: Nada Libman, MD;  Location: [facility] CATH LAB;  Service: Cardiovascular;  Laterality: N/A;  . Carotid angiogram Bilateral 02/22/2012    Procedure: CAROTID ANGIOGRAM;  Surgeon: Nada Libman, MD;  Location: [facility] CATH LAB;  Service: Cardiovascular;  Laterality: Bilateral;  . Left heart catheterization with coronary/graft angiogram N/A 01/16/2014    Procedure: LEFT HEART CATHETERIZATION WITH Isabel Caprice;  Surgeon: Kathleene Hazel, MD;  Location: [facility] CATH LAB;  Service: Cardiovascular;  Laterality: N/A;  . Abdominal aortagram    . Colonoscopy    . Peripheral vascular catheterization N/A 06/24/2015    Procedure: Abdominal Aortogram;  Surgeon: Nada Libman, MD;  Location: [facility] INVASIVE CV LAB;  Service: Cardiovascular;  Laterality: N/A;  . Endarterectomy femoral Right 06/25/2015    Procedure: ENDARTERECTOMY RIGHT FEMORAL ARTERY ;  Surgeon: Larina Earthly, MD;  Location[facility] OR;  Service: Vascular;  Laterality: Right;  . Patch angioplasty Right 06/25/2015    Procedure: RIGHT COMMON FEMORAL ARTERY AND PROFUNDA PATCH ANGIOPLASTY USING HEMASHIELD PLATINUM FINESSE PATCH ;  Surgeon: Larina Earthly, MD;  Location: [facility] OR;  Service: Vascular;  Laterality: Right;     Current Outpatient Prescriptions  Medication Sig Dispense Refill  . acetaminophen (TYLENOL) 325 MG tablet Take 650 mg by mouth every 6 (six) hours as needed. Reported on 11/25/2015    . amLODipine (NORVASC) 5 MG tablet TAKE 1 TABLET (5 MG TOTAL) BY MOUTH DAILY. 30 tablet 9  . aspirin 81 MG tablet Take 81 mg by mouth daily.    Marland Kitchen atorvastatin (LIPITOR) 40 MG tablet Take 1 tablet (40 mg total) by mouth daily at 6 PM. 30 tablet 5  . cefUROXime (CEFTIN) 500 MG tablet Take 1 tablet (500 mg total) by mouth 2 (two) times daily with a meal. 20 tablet 0  . clopidogrel (PLAVIX) 75 MG tablet Take 1 tablet (75 mg total) by mouth at  bedtime. Resume in 1 week.    . furosemide (LASIX) 20 MG tablet Take 1 tablet (20 mg total) by mouth daily. 30 tablet 3  . isosorbide mononitrate (IMDUR) 30 MG 24 hr tablet TAKE 1 TABLET (30 MG TOTAL) BY MOUTH DAILY. 30 tablet 8  . metoprolol succinate (TOPROL-XL) 100 MG 24 hr tablet Take 100 mg by mouth daily. Take with or immediately following a meal.    . nitroGLYCERIN (NITROSTAT) 0.4 MG SL tablet Place 1 tablet (0.4 mg total) under the tongue every 5 (five) minutes as needed. For chest pain 25 tablet 3  . potassium chloride (K-DUR) 10 MEQ tablet Take 1 tablet (10 mEq total) by mouth daily. 90 tablet 3  . Tamsulosin HCl (FLOMAX) 0.4 MG CAPS Take 0.4 mg by mouth daily after supper.      No current facility-administered medications for this visit.    Allergies:   Pravachol; Simvastatin; Other; Oxycodone; and Terazosin    Social History:  The patient  reports that he quit smoking about 9 months ago. His smoking use included Cigarettes. He has a 34 pack-year smoking history. He has never used smokeless tobacco. He reports that he does not drink alcohol or use illicit drugs.   Family History:  The patient's family history includes Cancer (age of onset: 35) in his brother; Hyperlipidemia in his brother; Hypertension in his brother; Lung disease in his father; Prostate cancer in his brother.    ROS:  Please see the history of present illness.   Otherwise, review of systems are positive for none.   All other systems are reviewed and negative.   PHYSICAL EXAM: VS:  There were no vitals taken for this visit. , BMI There is no weight on file to calculate BMI. GEN: Well nourished, well developed, in no acute distress HEENT: normal Neck: no JVD, carotid bruits, or masses Cardiac: RRR; 2/6 systolic murmur, rubs, or gallops, mild right LE edema, healing scar in the right groin with minimal hematoma, no bleeding  Respiratory:  clear to auscultation bilaterally, normal work of breathing GI: soft,  nontender, nondistended, + BS MS: no deformity or atrophy Skin: warm and dry, no rash Neuro:  Strength and sensation are intact Psych: euthymic mood, full affect  Recent  Labs: 01/28/2016: ALT 9* 01/29/2016: B Natriuretic Peptide 865.0* 01/31/2016: Magnesium 2.1 02/01/2016: BUN 26*; Creatinine, Ser 1.72*; Hemoglobin 13.4; Platelets 129*; Potassium 3.5; Sodium 143    Lipid Panel    Component Value Date/Time   CHOL 101 07/06/2011 0635   TRIG 61 07/06/2011 0635   HDL 39* 07/06/2011 0635   CHOLHDL 2.6 07/06/2011 0635   VLDL 12 07/06/2011 0635   LDLCALC 50 07/06/2011 0635   Wt Readings from Last 3 Encounters:  02/01/16 137 lb 5.6 oz (62.3 kg)  11/25/15 143 lb (64.864 kg)  10/27/15 145 lb 12.8 oz (66.134 kg)    TTE: 11/18/2014  - Left ventricle: Abnormal global longitudinal strain -14.5. The cavity size was mildly dilated. Wall thickness was normal. The estimated ejection fraction was 45%. Diffuse hypokinesis. Doppler parameters are consistent with elevated ventricular end-diastolic filling pressure. - Aortic valve: There was mild to moderate regurgitation. - Mitral valve: There was mild regurgitation. - Left atrium: The atrium was mildly to moderately dilated. - Right atrium: The atrium was mildly to moderately dilated. - Atrial septum: No defect or patent foramen ovale was identified. - Tricuspid valve: There was moderate regurgitation. - Pulmonary arteries: PA peak pressure: 35 mm Hg (S).    ASSESSMENT AND PLAN:  82 year old male   1. RLE pain - check for DVT, we will order venous Duplex to be done ASAP  2. Dehydration - with acute on chronic renal failure, we will hold lasix for another week  3. CAD, h/o CABG in 2000, NSTEMI on 12/30/2013 - in the settings of acute URI, maximum troponin 5, Positive Lexiscan nuclear stress test with mid and basal inferolateral scar with periinfarct ischemia, moderate LV dysfunction 40%, cardiac cath on 01/17/14 showed severe  disease in body of SVG to OM, s/p successful PTCA/bare metal stent x 1 mid body of SVG to OM. ASA and Plavix for at least one month but longer if he tolerates well. Continue metoprolol, atorvastatin.  He is asymptomatic.  4. Chronic systolic CHF - LVEF 35%, now dehydrated, hold lasix   5.Chronic CKD - as above  6. S/P PPM for symptomatic bradycardia - followed by Dr [name], working well  7. Hypertension - uncontrolled, we will refill amlodipine.   8. PAD - S/P right s/p right external iliac, common femoral and profundus endarterectomy with dacron patch angioplasty on 7/201/6 with groin hematoma - no bruit, resolving hematoma, we will check Hb today.  Follow up in 3 months, CMP.  Signed, Lars Masson, MD  02/02/2016 11:59 AM    [facility] [address] [place], [place], [place]  [address] Phone: [phone]; Fax: [phone]

## 2016-02-02 NOTE — Patient Instructions (Signed)
Medication Instructions:  Do not take Furosemide/Lasix for 1 week. No changes in your remaining medications  Labwork: None  Testing/Procedures: Your physician has requested that you have a lower extremity venous duplex. This test is an ultrasound of the veins in the legs. It looks at venous blood flow that carries blood from the heart to the legs. Allow one hour for a Lower Venous exam. Allow thirty minutes for an Upper Venous exam. There are no restrictions or special instructions.  Follow-Up: Your physician recommends that you schedule a follow-up appointment in: 3 months     If you need a refill on your cardiac medications before your next appointment, please call your pharmacy.

## 2016-02-04 ENCOUNTER — Ambulatory Visit (HOSPITAL_COMMUNITY)
Admission: RE | Admit: 2016-02-04 | Discharge: 2016-02-04 | Disposition: A | Discharge status: Home / Self Care | Payer: Medicare Other | Source: Ambulatory Visit | Attending: Cardiology | Admitting: Cardiology

## 2016-02-04 DIAGNOSIS — M79604 Pain in right leg: Secondary | ICD-10-CM | POA: Diagnosis present

## 2016-02-04 DIAGNOSIS — I110 Hypertensive heart disease with heart failure: Secondary | ICD-10-CM | POA: Insufficient documentation

## 2016-02-04 DIAGNOSIS — I5022 Chronic systolic (congestive) heart failure: Secondary | ICD-10-CM | POA: Insufficient documentation

## 2016-02-04 DIAGNOSIS — E785 Hyperlipidemia, unspecified: Secondary | ICD-10-CM | POA: Insufficient documentation

## 2016-02-09 DIAGNOSIS — R600 Localized edema: Secondary | ICD-10-CM | POA: Insufficient documentation

## 2016-02-09 DIAGNOSIS — I619 Nontraumatic intracerebral hemorrhage, unspecified: Secondary | ICD-10-CM | POA: Insufficient documentation

## 2016-02-09 DIAGNOSIS — I714 Abdominal aortic aneurysm, without rupture, unspecified: Secondary | ICD-10-CM | POA: Insufficient documentation

## 2016-02-09 DIAGNOSIS — I25810 Atherosclerosis of coronary artery bypass graft(s) without angina pectoris: Secondary | ICD-10-CM | POA: Insufficient documentation

## 2016-02-09 DIAGNOSIS — K449 Diaphragmatic hernia without obstruction or gangrene: Secondary | ICD-10-CM | POA: Insufficient documentation

## 2016-02-09 DIAGNOSIS — I509 Heart failure, unspecified: Secondary | ICD-10-CM | POA: Insufficient documentation

## 2016-02-09 DIAGNOSIS — F419 Anxiety disorder, unspecified: Secondary | ICD-10-CM | POA: Insufficient documentation

## 2016-02-09 DIAGNOSIS — I4891 Unspecified atrial fibrillation: Secondary | ICD-10-CM | POA: Insufficient documentation

## 2016-02-09 DIAGNOSIS — K279 Peptic ulcer, site unspecified, unspecified as acute or chronic, without hemorrhage or perforation: Secondary | ICD-10-CM | POA: Insufficient documentation

## 2016-02-09 DIAGNOSIS — N189 Chronic kidney disease, unspecified: Secondary | ICD-10-CM | POA: Insufficient documentation

## 2016-02-09 DIAGNOSIS — N400 Enlarged prostate without lower urinary tract symptoms: Secondary | ICD-10-CM | POA: Insufficient documentation

## 2016-02-09 DIAGNOSIS — I2510 Atherosclerotic heart disease of native coronary artery without angina pectoris: Secondary | ICD-10-CM | POA: Insufficient documentation

## 2016-02-09 DIAGNOSIS — I70209 Unspecified atherosclerosis of native arteries of extremities, unspecified extremity: Secondary | ICD-10-CM | POA: Insufficient documentation

## 2016-02-09 DIAGNOSIS — I739 Peripheral vascular disease, unspecified: Secondary | ICD-10-CM | POA: Insufficient documentation

## 2016-02-11 DIAGNOSIS — G8929 Other chronic pain: Secondary | ICD-10-CM | POA: Insufficient documentation

## 2016-02-11 DIAGNOSIS — R109 Unspecified abdominal pain: Secondary | ICD-10-CM

## 2016-02-11 DIAGNOSIS — N12 Tubulo-interstitial nephritis, not specified as acute or chronic: Secondary | ICD-10-CM | POA: Insufficient documentation

## 2016-02-11 DIAGNOSIS — Z9289 Personal history of other medical treatment: Secondary | ICD-10-CM | POA: Insufficient documentation

## 2016-02-26 ENCOUNTER — Telehealth: Payer: Self-pay | Admitting: Cardiology

## 2016-02-26 ENCOUNTER — Telehealth: Payer: Self-pay | Admitting: *Deleted

## 2016-02-26 MED ORDER — ISOSORBIDE MONONITRATE ER 30 MG PO TB24: ORAL_TABLET | ORAL | Status: DC

## 2016-02-26 MED ORDER — FUROSEMIDE 20 MG PO TABS: 20.0000 mg | ORAL_TABLET | Freq: Every day | ORAL | Status: DC

## 2016-02-26 MED ORDER — POTASSIUM CHLORIDE ER 10 MEQ PO TBCR: 10.0000 meq | EXTENDED_RELEASE_TABLET | Freq: Every day | ORAL | Status: DC

## 2016-02-26 MED ORDER — AMLODIPINE BESYLATE 5 MG PO TABS: ORAL_TABLET | ORAL | Status: DC

## 2016-02-26 NOTE — Telephone Encounter (Signed)
Walk in form given to Dr Delton SeeNelson to have the pts cardiac meds requested (lasix, k-dur, imdur, amlodipine) filled for a 90 day supply and printed off to [facility] to the [facility] at [phone].  [facility] [place] and the pt wrote that [facility] [place].  Pt request his prescriptions be sent to the [facility] there for coverage. Refilled all the pts cardiac meds and Dr Delton SeeNelson signed them.  Will send these [facility], as requested by the pt, [facility] [phone]. Pt verbalized understanding and gracious for all the assistance provided.

## 2016-02-26 NOTE — Telephone Encounter (Signed)
Walk in pt form-Pt Needs refills-[facility]

## 2016-03-11 ENCOUNTER — Encounter (HOSPITAL_COMMUNITY): Payer: Self-pay

## 2016-03-11 ENCOUNTER — Emergency Department (HOSPITAL_COMMUNITY): Payer: Medicare Other

## 2016-03-11 ENCOUNTER — Observation Stay (HOSPITAL_COMMUNITY)
Admission: EM | Admit: 2016-03-11 | Discharge: 2016-03-15 | Disposition: A | Discharge status: Home / Self Care | Payer: Medicare Other | Attending: Cardiology | Admitting: Cardiology

## 2016-03-11 DIAGNOSIS — Z8673 Personal history of transient ischemic attack (TIA), and cerebral infarction without residual deficits: Secondary | ICD-10-CM | POA: Insufficient documentation

## 2016-03-11 DIAGNOSIS — I252 Old myocardial infarction: Secondary | ICD-10-CM | POA: Diagnosis not present

## 2016-03-11 DIAGNOSIS — I160 Hypertensive urgency: Secondary | ICD-10-CM | POA: Insufficient documentation

## 2016-03-11 DIAGNOSIS — Z7902 Long term (current) use of antithrombotics/antiplatelets: Secondary | ICD-10-CM | POA: Diagnosis not present

## 2016-03-11 DIAGNOSIS — I712 Thoracic aortic aneurysm, without rupture, unspecified: Secondary | ICD-10-CM

## 2016-03-11 DIAGNOSIS — Z87891 Personal history of nicotine dependence: Secondary | ICD-10-CM | POA: Diagnosis not present

## 2016-03-11 DIAGNOSIS — Z8701 Personal history of pneumonia (recurrent): Secondary | ICD-10-CM | POA: Diagnosis not present

## 2016-03-11 DIAGNOSIS — I5023 Acute on chronic systolic (congestive) heart failure: Secondary | ICD-10-CM

## 2016-03-11 DIAGNOSIS — Z951 Presence of aortocoronary bypass graft: Secondary | ICD-10-CM | POA: Diagnosis not present

## 2016-03-11 DIAGNOSIS — I5042 Chronic combined systolic (congestive) and diastolic (congestive) heart failure: Secondary | ICD-10-CM | POA: Diagnosis present

## 2016-03-11 DIAGNOSIS — I495 Sick sinus syndrome: Secondary | ICD-10-CM | POA: Diagnosis present

## 2016-03-11 DIAGNOSIS — I739 Peripheral vascular disease, unspecified: Secondary | ICD-10-CM | POA: Insufficient documentation

## 2016-03-11 DIAGNOSIS — I2510 Atherosclerotic heart disease of native coronary artery without angina pectoris: Secondary | ICD-10-CM | POA: Insufficient documentation

## 2016-03-11 DIAGNOSIS — E039 Hypothyroidism, unspecified: Secondary | ICD-10-CM | POA: Diagnosis not present

## 2016-03-11 DIAGNOSIS — Z79899 Other long term (current) drug therapy: Secondary | ICD-10-CM | POA: Insufficient documentation

## 2016-03-11 DIAGNOSIS — R079 Chest pain, unspecified: Principal | ICD-10-CM | POA: Insufficient documentation

## 2016-03-11 DIAGNOSIS — Z7982 Long term (current) use of aspirin: Secondary | ICD-10-CM | POA: Diagnosis not present

## 2016-03-11 DIAGNOSIS — I5022 Chronic systolic (congestive) heart failure: Secondary | ICD-10-CM | POA: Diagnosis not present

## 2016-03-11 DIAGNOSIS — I2583 Coronary atherosclerosis due to lipid rich plaque: Secondary | ICD-10-CM

## 2016-03-11 DIAGNOSIS — I214 Non-ST elevation (NSTEMI) myocardial infarction: Secondary | ICD-10-CM

## 2016-03-11 DIAGNOSIS — J449 Chronic obstructive pulmonary disease, unspecified: Secondary | ICD-10-CM | POA: Insufficient documentation

## 2016-03-11 DIAGNOSIS — I25700 Atherosclerosis of coronary artery bypass graft(s), unspecified, with unstable angina pectoris: Secondary | ICD-10-CM | POA: Diagnosis not present

## 2016-03-11 DIAGNOSIS — Z950 Presence of cardiac pacemaker: Secondary | ICD-10-CM | POA: Insufficient documentation

## 2016-03-11 DIAGNOSIS — I25810 Atherosclerosis of coronary artery bypass graft(s) without angina pectoris: Secondary | ICD-10-CM | POA: Diagnosis present

## 2016-03-11 DIAGNOSIS — I714 Abdominal aortic aneurysm, without rupture, unspecified: Secondary | ICD-10-CM | POA: Insufficient documentation

## 2016-03-11 DIAGNOSIS — E785 Hyperlipidemia, unspecified: Secondary | ICD-10-CM | POA: Insufficient documentation

## 2016-03-11 DIAGNOSIS — J441 Chronic obstructive pulmonary disease with (acute) exacerbation: Secondary | ICD-10-CM | POA: Diagnosis present

## 2016-03-11 DIAGNOSIS — Z720 Tobacco use: Secondary | ICD-10-CM | POA: Diagnosis present

## 2016-03-11 DIAGNOSIS — I161 Hypertensive emergency: Secondary | ICD-10-CM | POA: Diagnosis present

## 2016-03-11 LAB — I-STAT CHEM 8, ED
BUN: 18 mg/dL (ref 6–20)
CREATININE: 1.5 mg/dL — AB (ref 0.61–1.24)
Calcium, Ion: 1.05 mmol/L — ABNORMAL LOW (ref 1.13–1.30)
Chloride: 109 mmol/L (ref 101–111)
Glucose, Bld: 105 mg/dL — ABNORMAL HIGH (ref 65–99)
HEMATOCRIT: 45 % (ref 39.0–52.0)
HEMOGLOBIN: 15.3 g/dL (ref 13.0–17.0)
POTASSIUM: 3.4 mmol/L — AB (ref 3.5–5.1)
SODIUM: 147 mmol/L — AB (ref 135–145)
TCO2: 24 mmol/L (ref 0–100)

## 2016-03-11 LAB — CBC WITH DIFFERENTIAL/PLATELET
BASOS ABS: 0.1 10*3/uL (ref 0.0–0.1)
BASOS PCT: 1 %
Eosinophils Absolute: 0.1 10*3/uL (ref 0.0–0.7)
Eosinophils Relative: 1 %
HEMATOCRIT: 41.7 % (ref 39.0–52.0)
HEMOGLOBIN: 13.9 g/dL (ref 13.0–17.0)
Lymphocytes Relative: 23 %
Lymphs Abs: 2.2 10*3/uL (ref 0.7–4.0)
MCH: 32.1 pg (ref 26.0–34.0)
MCHC: 33.3 g/dL (ref 30.0–36.0)
MCV: 96.3 fL (ref 78.0–100.0)
Monocytes Absolute: 0.6 10*3/uL (ref 0.1–1.0)
Monocytes Relative: 6 %
NEUTROS ABS: 6.7 10*3/uL (ref 1.7–7.7)
NEUTROS PCT: 69 %
Platelets: 123 10*3/uL — ABNORMAL LOW (ref 150–400)
RBC: 4.33 MIL/uL (ref 4.22–5.81)
RDW: 15.3 % (ref 11.5–15.5)
WBC: 9.6 10*3/uL (ref 4.0–10.5)

## 2016-03-11 LAB — TROPONIN I
Troponin I: 0.6 ng/mL (ref <0.031)
Troponin I: 0.49 ng/mL — ABNORMAL HIGH (ref <0.031)
Troponin I: 0.47 ng/mL — ABNORMAL HIGH (ref <0.031)

## 2016-03-11 LAB — BRAIN NATRIURETIC PEPTIDE: B NATRIURETIC PEPTIDE 5: 2999.7 pg/mL — AB (ref 0.0–100.0)

## 2016-03-11 LAB — I-STAT TROPONIN, ED: Troponin i, poc: 0.37 ng/mL (ref 0.00–0.08)

## 2016-03-11 MED ORDER — METOPROLOL SUCCINATE ER 100 MG PO TB24
100.0000 mg | ORAL_TABLET | Freq: Every day | ORAL | Status: DC
  Administered 2016-03-11 – 2016-03-15 (×5): 100 mg via ORAL
  Filled 2016-03-11 (×5): qty 1

## 2016-03-11 MED ORDER — MORPHINE SULFATE (PF) 2 MG/ML IV SOLN
2.0000 mg | Freq: Once | INTRAVENOUS | Status: AC
  Administered 2016-03-11: 2 mg via INTRAVENOUS
  Filled 2016-03-11: qty 1

## 2016-03-11 MED ORDER — ASPIRIN EC 81 MG PO TBEC
81.0000 mg | DELAYED_RELEASE_TABLET | Freq: Every day | ORAL | Status: DC
  Administered 2016-03-11: 81 mg via ORAL
  Filled 2016-03-11: qty 1

## 2016-03-11 MED ORDER — ACETAMINOPHEN 325 MG PO TABS
650.0000 mg | ORAL_TABLET | ORAL | Status: DC | PRN
  Administered 2016-03-12 – 2016-03-15 (×2): 650 mg via ORAL
  Filled 2016-03-11 (×2): qty 2

## 2016-03-11 MED ORDER — NITROGLYCERIN 0.4 MG SL SUBL
0.4000 mg | SUBLINGUAL_TABLET | SUBLINGUAL | Status: DC | PRN
  Administered 2016-03-11 – 2016-03-14 (×7): 0.4 mg via SUBLINGUAL
  Filled 2016-03-11 (×3): qty 1

## 2016-03-11 MED ORDER — HEPARIN (PORCINE) IN NACL 100-0.45 UNIT/ML-% IJ SOLN
1050.0000 [IU]/h | INTRAMUSCULAR | Status: DC
  Administered 2016-03-11: 800 [IU]/h via INTRAVENOUS
  Filled 2016-03-11: qty 250

## 2016-03-11 MED ORDER — NITROGLYCERIN 0.4 MG SL SUBL
SUBLINGUAL_TABLET | SUBLINGUAL | Status: AC
  Filled 2016-03-11: qty 1

## 2016-03-11 MED ORDER — CLOPIDOGREL BISULFATE 75 MG PO TABS
75.0000 mg | ORAL_TABLET | Freq: Every day | ORAL | Status: DC
  Administered 2016-03-11 – 2016-03-14 (×4): 75 mg via ORAL
  Filled 2016-03-11 (×4): qty 1

## 2016-03-11 MED ORDER — FUROSEMIDE 10 MG/ML IJ SOLN
40.0000 mg | Freq: Two times a day (BID) | INTRAMUSCULAR | Status: DC
Start: 2016-03-11 — End: 2016-03-12
  Administered 2016-03-11 (×2): 40 mg via INTRAVENOUS
  Filled 2016-03-11 (×3): qty 4

## 2016-03-11 MED ORDER — NITROGLYCERIN IN D5W 200-5 MCG/ML-% IV SOLN
0.0000 ug/min | INTRAVENOUS | Status: DC
  Administered 2016-03-11 – 2016-03-12 (×2): 5 ug/min via INTRAVENOUS
  Filled 2016-03-11: qty 250

## 2016-03-11 MED ORDER — IOPAMIDOL (ISOVUE-370) INJECTION 76%
80.0000 mL | Freq: Once | INTRAVENOUS | Status: AC | PRN
Start: 2016-03-11 — End: 2016-03-11
  Administered 2016-03-11: 80 mL via INTRAVENOUS

## 2016-03-11 MED ORDER — TAMSULOSIN HCL 0.4 MG PO CAPS
0.4000 mg | ORAL_CAPSULE | Freq: Every day | ORAL | Status: DC
  Administered 2016-03-11 – 2016-03-15 (×5): 0.4 mg via ORAL
  Filled 2016-03-11 (×5): qty 1

## 2016-03-11 MED ORDER — ONDANSETRON HCL 4 MG/2ML IJ SOLN: 4.0000 mg | Freq: Four times a day (QID) | INTRAMUSCULAR | Status: DC | PRN

## 2016-03-11 MED ORDER — FUROSEMIDE 10 MG/ML IJ SOLN
40.0000 mg | Freq: Once | INTRAMUSCULAR | Status: AC
  Administered 2016-03-11: 40 mg via INTRAVENOUS
  Filled 2016-03-11: qty 4

## 2016-03-11 MED ORDER — ATORVASTATIN CALCIUM 40 MG PO TABS
40.0000 mg | ORAL_TABLET | Freq: Every day | ORAL | Status: DC
  Administered 2016-03-11 – 2016-03-15 (×5): 40 mg via ORAL
  Filled 2016-03-11 (×5): qty 1

## 2016-03-11 MED ORDER — POTASSIUM CHLORIDE ER 10 MEQ PO TBCR
10.0000 meq | EXTENDED_RELEASE_TABLET | Freq: Every day | ORAL | Status: DC
  Administered 2016-03-11 – 2016-03-15 (×4): 10 meq via ORAL
  Filled 2016-03-11 (×9): qty 1

## 2016-03-11 MED ORDER — LABETALOL HCL 5 MG/ML IV SOLN
20.0000 mg | Freq: Once | INTRAVENOUS | Status: AC
  Administered 2016-03-11: 20 mg via INTRAVENOUS
  Filled 2016-03-11: qty 4

## 2016-03-11 MED ORDER — AMLODIPINE BESYLATE 10 MG PO TABS
10.0000 mg | ORAL_TABLET | Freq: Every day | ORAL | Status: DC
  Administered 2016-03-12 – 2016-03-15 (×4): 10 mg via ORAL
  Filled 2016-03-11 (×4): qty 1

## 2016-03-11 NOTE — ED Notes (Signed)
Cards remains at bedside, pt O2 sats dropped to 88%, O2 increased to 5 liters.  Pt is restless, reports pain increased to 9/10.

## 2016-03-11 NOTE — ED Notes (Signed)
Pt noted to have SpO2 decreased to 79-80% on 4 L nasal cannula while in a wide QRS ventricular rhythm.  As soon as the rhythm changed to sinus, pt SpO2 increased to 90-91% on 4 L nasal cannula.

## 2016-03-11 NOTE — H&P (Addendum)
Patient ID: [name] MRN: [id number], DOB/AGE: 80-Jun-1928   Admit date: 03/11/2016   Primary Physician: Delorse Lek, MD Primary Cardiologist: [facility]. R[facility]  Pt. Profile:  Mr. Blanchfield is a cachectic 82 year old African-American male with past medical history of HTN, HLD, AAA/abdominal bruits, hypothyroidism, orthostatic hypotension, PAD, COPD, SSS s/p Medtronic PPM (placed by Dr. Jenne Campus 2003 with generator change 08/2010), CVA and CAD s/p 4v CABG in 2000 (LIMA to LAD, SVG to diag, SVG to OM4, SVG to PDA) presented with chest pain and SOB that woke him up from sleep this morning at 4:30AM. BNP 3000. SBP 170-190s in ED.  Problem List  Past Medical History  Diagnosis Date  . Hypertension   . CAD (coronary artery disease)     a. s/p MI in 1988;  b. s/p CABG in 2000 (Dr. Barry Dienes) x 4, LIMA to LAD, SVG to diagonal, SVG to [facility], SVG to PDA;  c. 12/2013 NSTEMI/abnl MV;  d. 01/2014 Cath/PCI: native 3VD, VG->RCA 100, VG->OM aneurysmal 40ost/m, 56m(5.0x12 Veriflex BMS), VG->Diag aneurysmal, LIMA->LAD nl.  . TIA (transient ischemic attack)   . Hyperlipidemia   . COPD (chronic obstructive pulmonary disease) (HCC)   . CVA (cerebral infarction) 07/2011    Remote left brain infarct with sensory deficits and had a left internal carotid stent placed by Dr. [name] at that time, no recurrent CVA or TIAs or CNS events.  Marland Kitchen PAD (peripheral artery disease) (HCC)     Stable. Had a past RFPBG by Dr. [name] in 2009. this graft is occluded with reconstitution in the popliteal artery, which was patent. Right tibial was occluded and 2-vessel runoff on angiography done by [name] in 12/2010.  . Sick sinus syndrome (HCC)     PPM implanted for this and syncope   . Chronic systolic CHF (congestive heart failure) (HCC)     Hospitalization for CHF from 05/04/13-05/06/13. Treated medically. Had possible pneumonia with short course of antibiotics.  . Abdominal bruit     Loud  . AAA (abdominal aortic aneurysm)  (HCC)     Has a known AAA of 3.2 x 3.4 cm by abdominal untrasound 03/27/12. Followup abdominal ultrasound on 03/29/13 showed a diameter of 3.6 cm, which is stable  . Peripheral vascular disease (HCC)   . Hypothyroid     On supplement "don't know if I take RX or not for my thyroid" (01/16/2014)  . Stroke (HCC)   . Syncope     tilt table test/8.10.01/orthostatic hypotension  . Orthostatic hypotension   . Presence of permanent cardiac pacemaker   . Pneumonia   . Headache     Past Surgical History  Procedure Laterality Date  . Femoral bypass Right ?2001  . Carotid stent Left 06/23/11    By [name]  . Coronary artery bypass graft  2000    1st in 2000 (Dr. Barry Dienes) x 4, LIMA to LAD, SVG to diagonal, SVG to OM4, SVG to PDA. Re-cath in May 2012 with patent LIMA to LAD, patent SVG to diagonal, patent SVG to OM with left-to-right collaterals to an occluded right.  . Coronary angioplasty with stent placement  2001-01/16/2014    "think today makes #4" (01/16/2014)  . Cardiac catheterization  2000  . Inguinal hernia repair Right 1980's  . Insert / replace / remove pacemaker      implanted 2003 by Dr Jenne Campus with gen change (MDT [id number]) [facility]  . Cataract extraction w/ intraocular lens  implant, bilateral Bilateral   . Abdominal  aortagram N/A 02/22/2012    Procedure: ABDOMINAL Ronny Flurry;  Surgeon: Nada Libman, MD;  Location: [facility] CATH LAB;  Service: Cardiovascular;  Laterality: N/A;  . Carotid angiogram Bilateral 02/22/2012    Procedure: CAROTID ANGIOGRAM;  Surgeon: Nada Libman, MD;  Location: [facility] CATH LAB;  Service: Cardiovascular;  Laterality: Bilateral;  . Left heart catheterization with coronary/graft angiogram N/A 01/16/2014    Procedure: LEFT HEART CATHETERIZATION WITH Isabel Caprice;  Surgeon: Kathleene Hazel, MD;  Location: [facility] CATH LAB;  Service: Cardiovascular;  Laterality: N/A;  . Abdominal aortagram    . Colonoscopy    . Peripheral vascular catheterization N/A  06/24/2015    Procedure: Abdominal Aortogram;  Surgeon: Nada Libman, MD;  Location: [facility] INVASIVE CV LAB;  Service: Cardiovascular;  Laterality: N/A;  . Endarterectomy femoral Right 06/25/2015    Procedure: ENDARTERECTOMY RIGHT FEMORAL ARTERY ;  Surgeon: Larina Earthly, MD;  Location: [facility] OR;  Service: Vascular;  Laterality: Right;  . Patch angioplasty Right 06/25/2015    Procedure: RIGHT COMMON FEMORAL ARTERY AND PROFUNDA PATCH ANGIOPLASTY USING HEMASHIELD PLATINUM FINESSE PATCH ;  Surgeon: Larina Earthly, MD;  Location: [facility] OR;  Service: Vascular;  Laterality: Right;     Allergies  Allergies  Allergen Reactions  . Pravachol Other (See Comments)    myalgias  . Simvastatin Other (See Comments)    myalgia  . Other Other (See Comments)    GREEN BEANS AND ONIONS  . Oxycodone Other (See Comments)    Family member reports "he was highly agitated and disoriented"   . Terazosin Other (See Comments)    unknown    HPI  Mr. Mucci is a cachectic 82 year old African-American male with past medical history of HTN, HLD, AAA/abdominal bruits, hypothyroidism, orthostatic hypotension, PAD, COPD, SSS s/p Medtronic PPM (placed by Dr. Jenne Campus 2003 with generator change 08/2010), CVA and CAD s/p 4v CABG in 2000 (LIMA to LAD, SVG to diag, SVG to OM4, SVG to PDA). He underwent cardiac catheterization on 01/17/2014 showed severe disease in the body of SVG to OM s/p successful PTCA/bare metal stent, chronically occluded RCA, aneurysmal SVG to diagonal, patent LIMA to LAD. He denies ever having any chest pain before, his anginal symptom has been shortness of breath. He was admitted in December 2015 with numbness of the right arm and leg, CT of the head at the time showed a small intraparenchymal hemorrhage involving the left centrum semiovale. His Plavix was held for a short period time and was later restarted. Echocardiogram obtained on 03/19/2014 showed EF 45%, diffuse hypokinesis. I saw him back in the ED with atypical  chest pain in June 2016. At that time his d-dimer was greater than 20. Venous Doppler was negative for DVT. CTA of the chest was negative for PE. CT scan of the abdomen showed 5.1 cm AAA, outpatient surgical consultation was recommended. His serial troponin was mildly elevated at the time however flat, this was felt not to represent ACS. He was eventually discharged for outpatient stress test, outpatient stress test performed on 05/22/2015 showed EF 32% with inferior and apical hypokinesis, small area of inferolateral wall infarct at the base and a small apical infarct, however no ischemia.   Since the last time I saw him, he has underwent lower extremity evaluation by [name] of vascular surgery and had right external iliac and common femoral and profundus endarterectomy. He was most recently admitted in February 2017 for abdominal pain, he was diagnosed with likely left-sided pyelonephritis which was  seen on CT angiogram of abdomen and pelvis. His Lasix was temporarily held due to worsening renal function. CT of abdomen and pelvis also showed persistent abdominal aortic aneurysm, maximal dimension 4.2 cm, increased from 3 cm in 2013. It was recommended to follow-up with abdominal ultrasound in one year.  Patient was essentially his usual state of health until woke up around 4:30 AM in the morning of 03/11/2016 with chest pain radiating to the back. He eventually contacted his family member around 6:30 gave him 4 baby aspirin, his chest pain went away after that. He also admits to have some shortness of breath and cough as well. He has chronic right lower extremity edema secondary to vein harvesting. Initial chest x-ray showed increased perihilar interstitial density concerning for pulmonary edema. Significant laboratory finding include a troponin of 0.37. BNP of 3000. Significant laboratory finding also include potassium 3.4, creatinine 1.5 which appears to be his baseline. EKG was difficult to interpret due  to PVCs and motion artifact, however does not appear to have significant ST changes. Cardiology has been consulted for chest pain.    Home Medications  Prior to Admission medications   Medication Sig Start Date End Date Taking? Authorizing Provider  acetaminophen (TYLENOL) 325 MG tablet Take 650 mg by mouth every 6 (six) hours as needed. Reported on 11/25/2015   Yes Historical Provider, MD  amLODipine (NORVASC) 5 MG tablet TAKE 1 TABLET (5 MG TOTAL) BY MOUTH DAILY. 02/26/16  Yes Lars MassonKatarina H Cicely Ortner, MD  aspirin 81 MG tablet Take 81 mg by mouth daily.   Yes Historical Provider, MD  atorvastatin (LIPITOR) 40 MG tablet Take 1 tablet (40 mg total) by mouth daily at 6 PM. 05/14/15  Yes Brittainy Sherlynn CarbonM Simmons, PA-C  clopidogrel (PLAVIX) 75 MG tablet Take 1 tablet (75 mg total) by mouth at bedtime. Resume in 1 week. 07/11/15  Yes Rodolph Bonganiel Thompson V, MD  furosemide (LASIX) 20 MG tablet Take 1 tablet (20 mg total) by mouth daily. 02/26/16  Yes Lars MassonKatarina H Hallee Mckenny, MD  isosorbide mononitrate (IMDUR) 30 MG 24 hr tablet TAKE 1 TABLET (30 MG TOTAL) BY MOUTH DAILY. 02/26/16  Yes Lars MassonKatarina H Abdulmalik Darco, MD  metoprolol succinate (TOPROL-XL) 100 MG 24 hr tablet Take 100 mg by mouth daily. Take with or immediately following a meal.   Yes Historical Provider, MD  potassium chloride (K-DUR) 10 MEQ tablet Take 1 tablet (10 mEq total) by mouth daily. 02/26/16  Yes Lars MassonKatarina H Therman Hughlett, MD  Tamsulosin HCl (FLOMAX) 0.4 MG CAPS Take 0.4 mg by mouth daily after supper.    Yes Historical Provider, MD  cefUROXime (CEFTIN) 500 MG tablet Take 1 tablet (500 mg total) by mouth 2 (two) times daily with a meal. 02/01/16   Clydia LlanoMutaz Elmahi, MD  nitroGLYCERIN (NITROSTAT) 0.4 MG SL tablet Place 1 tablet (0.4 mg total) under the tongue every 5 (five) minutes as needed. For chest pain 10/27/15   Lars MassonKatarina H Keshun Berrett, MD    Family History  Family History  Problem Relation Age of Onset  . Cancer Brother 60    oral   . Prostate cancer Brother   . Hypertension  Brother   . Hyperlipidemia Brother   . Lung disease Father     black lung disease    Social History  Social History   Social History  . Marital Status: Widowed    Spouse Name: N/A  . Number of Children: 4  . Years of Education: N/A   Occupational History  . retired  truck Curator, [employer]   Social History Main Topics  . Smoking status: Former Smoker -- 0.50 packs/day for 68 years    Types: Cigarettes    Quit date: 05/02/2015  . Smokeless tobacco: Never Used  . Alcohol Use: No  . Drug Use: No  . Sexual Activity: Yes   Other Topics Concern  . Not on file   Social History Narrative   Lives in [place], spouse is now deceased.  Receives most care at the [facility].   4 children, 6 grandchildren     Review of Systems General:  No chills, fever, night sweats or weight changes.  Cardiovascular:  No dyspnea on exertion, orthopnea, palpitations. +chest pain, chronic RLE edema after vein harvesting, possible paroxysmal nocturnal dyspnea Dermatological: No rash, lesions/masses Respiratory: +cough, dyspnea Urologic: No hematuria, dysuria Abdominal:   No nausea, vomiting, diarrhea, bright red blood per rectum, melena, or hematemesis Neurologic:  No visual changes, wkns, changes in mental status. All other systems reviewed and are otherwise negative except as noted above.  Physical Exam  Blood pressure 182/99, pulse 73, temperature 98.1 F (36.7 C), resp. rate 21, weight 143 lb (64.864 kg), SpO2 94 %.  General: Pleasant, NAD Psych: Normal affect. Neuro: Alert and oriented X 3. Moves all extremities spontaneously. HEENT: Normal  Neck: Supple without bruits or JVD. Lungs:  Resp regular and unlabored, CTA. Heart: RRR no s3, s4, or murmurs. Abdomen: Soft, non-tender, non-distended, BS + x 4.  Extremities: No clubbing, cyanosis or edema. DP/PT/Radials 2+ and equal bilaterally.  Labs  Troponin [facility] Test)  Recent Labs  03/11/16 0832  TROPIPOC 0.37*   No  results for input(s): CKTOTAL, CKMB, TROPONINI in the last 72 hours. Lab Results  Component Value Date   WBC 9.6 03/11/2016   HGB 15.3 03/11/2016   HCT 45.0 03/11/2016   MCV 96.3 03/11/2016   PLT 123* 03/11/2016    Recent Labs Lab 03/11/16 0835  NA 147*  K 3.4*  CL 109  BUN 18  CREATININE 1.50*  GLUCOSE 105*   Lab Results  Component Value Date   CHOL 101 07/06/2011   HDL 39* 07/06/2011   LDLCALC 50 07/06/2011   TRIG 61 07/06/2011   Lab Results  Component Value Date   DDIMER >20.00* 05/13/2015     Radiology/Studies  Dg Chest Portable 1 View  03/11/2016  CLINICAL DATA:  Acute left-sided chest pain. EXAM: PORTABLE CHEST 1 VIEW COMPARISON:  May 13, 2015. FINDINGS: Stable cardiomegaly. Status post Coronary artery bypass graft. Left-sided pacemaker is unchanged in position. No pneumothorax is noted. Descending thoracic aortic enlargement is again noted consistent with aneurysm. Increased perihilar and bibasilar interstitial densities are noted concerning for pulmonary edema. No significant pleural effusion is noted. IMPRESSION: Continued presence of descending thoracic aortic aneurysm as noted on prior exams. Stable cardiomegaly. Increased perihilar and bibasilar interstitial densities are noted concerning for pulmonary edema. Electronically Signed   By: Lupita Raider, M.D.   On: 03/11/2016 08:38    ECG  Normal sinus rhythm with T wave inversion in V4, PVCs and a motion artifact making interpretation difficult, otherwise no obvious ST changes.  Echocardiogram 11/18/2014  LV EF: 45%  ------------------------------------------------------------------- Indications:   CVA 436.  ------------------------------------------------------------------- History:  PMH:  Coronary artery disease. Congestive heart failure. Congestive heart failure. Stroke. Chronic obstructive pulmonary disease. Risk factors: Current tobacco  use. Hypertension.  ------------------------------------------------------------------- Study Conclusions  - Left ventricle: Abnormal global longitudinal strain -14.5. The cavity size was mildly dilated. Wall  thickness was normal. The estimated ejection fraction was 45%. Diffuse hypokinesis. Doppler parameters are consistent with elevated ventricular end-diastolic filling pressure. - Aortic valve: There was mild to moderate regurgitation. - Mitral valve: There was mild regurgitation. - Left atrium: The atrium was mildly to moderately dilated. - Right atrium: The atrium was mildly to moderately dilated. - Atrial septum: No defect or patent foramen ovale was identified. - Tricuspid valve: There was moderate regurgitation. - Pulmonary arteries: PA peak pressure: 35 mm Hg (S).    ASSESSMENT AND PLAN  1. Chest pain at rest radiating to the back: Currently resolved, low suspicion for aortic dissection as would expect to symptom to be persistent. Symptom is different from his usual anginal symptoms. Troponin mildly elevated at 0.37 in the setting of BNP 3000 and a creatinine of 1.5.  - Will discuss with M.D., potentially admit the patient trend troponin overnight, if troponin continued to be flat, would focus on BP control and treat possible HF and may consider Myoview as outpatient. If trop go up, would pursue cath  - severely elevated BNP of 3000, he does not appears to be significantly fluid overloaded on physical exam, will obtain repeat echocardiogram. EGD has ordered a single dose of IV Lasix, I would hold off on ordering anymore IV Lasix and see how he responds to this current dose as I do not see severe fluid overload signs on physical exam. He does have elevated JVD, however is probably related to his cachectic size, SBP 170-190s and also potential tricuspid regurgitation.  2. Hypertensive urgency: Which also likely contributed to the elevated troponin and BNP. We'll need to  adjust his medication better, target goal systolic blood pressure 130 to 150s. Current blood pressure 190s in the ED.  3. CAD s/p 4v CABG in 2000 (LIMA to LAD, SVG to diag, SVG to OM4, SVG to PDA)  4. HLD  5. AAA/abdominal bruits: CT of abdomen on 01/28/2016 showed abdominal aortic aneurysm maximal dimension 4.3 cm, increased from 3 secondary in 2013, recommended one-year ultrasound.  6. Hypothyroidism  7. SSS s/p Medtronic PPM (placed by Dr. Jenne Campus 2003 with generator change 08/2010)  8. H/o CVA   Signed, Amedeo Plenty 03/11/2016, 10:01 AM  The patient was seen, examined and discussed with Azalee Course, PA-C and I agree with the above.   Mr. Jonpaul Lumm is a very pleasant 82 year old gentleman well known to me with known coronary artery disease, prior CABG 4 in 2000 and stenting in in 2015, he has chronic systolic CHF with diffuse hypokinesis and LVEF 45% he also has AAA and is status post pacemaker placement for sick sinus syndrome in 2011.  The patient woke up last night with chest pain and presented to the ER, on initial presentation he has chronic lower extremity edema is present in his lungs. His chest x-ray shows, edema, his BP is 3000 and troponin is mildly elevated 0.37. His creatinine is 1.5, baseline being 1.7-2.2. We will start treatment for acute on chronic systolic CHF with Lasix 40 mg by mouth IV twice a day, replacements of hypokalemia with 40 mEq of potassium daily. We'll continue cycling troponins and start heparin until they are downtrending. We will increase his amlodipine dose to 10 mg daily and currently systolic blood pressure is 180-190 mmHg.  Lars Masson 03/11/2016  Addendum: The patient has recurrent severe chest pain radiating to his back, it feels sharp, he states that he has never had pain like this before 9/10, he feels nauseous,  also headache. BP at bedside 254/201, started on NTG drip 20 mcg/min, also 2 sl NTG 0.4 mg , also labetalol 20 mg iv x 1 and  another lasix 40 mg iv x 1, he got a dose about 30 minutes prior to this. We will order a stat CT chest/abdomen/pelvis to rule out dissection. Hold heparin iv until dissection ruled out. Admit to step down. Another stat troponin sent just now.  I have personally spent critical time > 60 minutes with > 35 minutes with the patient in the room.   Lars Masson 03/11/2016

## 2016-03-11 NOTE — Progress Notes (Signed)
Troponin trending up, placed patient on cath board for tomorrow. Goal for tonight is to control BP.   Risk and benefit of procedure explained to the patient who display clear understanding and agree to proceed.  Discussed with patient possible procedural risk include bleeding, vascular injury, renal injury, arrythmia, MI, stroke and loss of limb or life.  Ramond DialSigned, Weston Fulco PA Pager: [phone]

## 2016-03-11 NOTE — ED Notes (Signed)
Pt presents with L sided chest pain that woke him from sleep this morning.  Pt reports shortness of breath and cough, reports pain radiates into L scapula and L shoulder.

## 2016-03-11 NOTE — ED Notes (Signed)
Cards,  Dr. Delton SeeNelson at bedside.

## 2016-03-11 NOTE — Progress Notes (Signed)
ANTICOAGULATION CONSULT NOTE - Initial Consult  Pharmacy Consult for Heparin  Indication: ACS with Descending aortic aneurysm  Allergies  Allergen Reactions  . Pravachol Other (See Comments)    myalgias  . Simvastatin Other (See Comments)    myalgia  . Other Other (See Comments)    GREEN BEANS AND ONIONS  . Oxycodone Other (See Comments)    Family member reports "he was highly agitated and disoriented"   . Terazosin Other (See Comments)    unknown    Patient Measurements: Weight: 143 lb (64.864 kg) Heparin Dosing Weight: 64 kg   Vital Signs: Temp: 98.1 F (36.7 C) (04/06 0802) BP: 192/91 mmHg (04/06 1411) Pulse Rate: 80 (04/06 1411)  Labs:  Recent Labs  03/11/16 0834 03/11/16 0835 03/11/16 1115  HGB 13.9 15.3  --   HCT 41.7 45.0  --   PLT 123*  --   --   CREATININE  --  1.50*  --   TROPONINI  --   --  0.60*    Estimated Creatinine Clearance: 31.8 mL/min (by C-G formula based on Cr of 1.5).   Medical History: Past Medical History  Diagnosis Date  . Hypertension   . CAD (coronary artery disease)     a. s/p MI in 1988;  b. s/p CABG in 2000 (Dr. Barry Dienes) x 4, LIMA to LAD, SVG to diagonal, SVG to [facility], SVG to PDA;  c. 12/2013 NSTEMI/abnl MV;  d. 01/2014 Cath/PCI: native 3VD, VG->RCA 100, VG->OM aneurysmal 40ost/m, 9m(5.0x12 Veriflex BMS), VG->Diag aneurysmal, LIMA->LAD nl.  . TIA (transient ischemic attack)   . Hyperlipidemia   . COPD (chronic obstructive pulmonary disease) (HCC)   . CVA (cerebral infarction) 07/2011    Remote left brain infarct with sensory deficits and had a left internal carotid stent placed by Dr. [name] at that time, no recurrent CVA or TIAs or CNS events.  Marland Kitchen PAD (peripheral artery disease) (HCC)     Stable. Had a past RFPBG by Dr. [name] in 2009. this graft is occluded with reconstitution in the popliteal artery, which was patent. Right tibial was occluded and 2-vessel runoff on angiography done by [name] in 12/2010.  . Sick sinus  syndrome (HCC)     PPM implanted for this and syncope   . Chronic systolic CHF (congestive heart failure) (HCC)     Hospitalization for CHF from 05/04/13-05/06/13. Treated medically. Had possible pneumonia with short course of antibiotics.  . Abdominal bruit     Loud  . AAA (abdominal aortic aneurysm) (HCC)     Has a known AAA of 3.2 x 3.4 cm by abdominal untrasound 03/27/12. Followup abdominal ultrasound on 03/29/13 showed a diameter of 3.6 cm, which is stable  . Peripheral vascular disease (HCC)   . Hypothyroid     On supplement "don't know if I take RX or not for my thyroid" (01/16/2014)  . Stroke (HCC)   . Syncope     tilt table test/8.10.01/orthostatic hypotension  . Orthostatic hypotension   . Presence of permanent cardiac pacemaker   . Pneumonia   . Headache     Medications:   (Not in a hospital admission)  Assessment: 83 YOM who presented with chest pain and SOB. SBP on presentation was 170-190s in the ED. CT chest showed a 5.6 cm descending aortic aneurysm. Pharmacy consulted to start IV heparin without bolus. H/H wnl, Plt low at 123K. He was not on anticoagulation prior to admission.   Goal of Therapy:  Heparin level 0.3-0.7 units/ml  Monitor platelets by anticoagulation protocol: Yes   Plan:  -Start IV heparin 800 units/hr WITHOUT bolus -F/u 8 hr HL -Monitor daily HL, CBC and s/s of bleeding   Vinnie LevelBenjamin Letha Mirabal, PharmD., BCPS Clinical Pharmacist Pager [phone]

## 2016-03-11 NOTE — ED Notes (Signed)
Family made aware of pt bed assignment

## 2016-03-11 NOTE — ED Provider Notes (Signed)
CSN: [id number]     Arrival date & time 03/11/16  0759 History   First MD Initiated Contact with Patient 03/11/16 [phone]     Chief Complaint  Patient presents with  . Chest Pain      Patient is a 82 y.o. male presenting with chest pain. The history is provided by the patient.  Chest Pain Pain location:  L chest Associated symptoms: no abdominal pain, no back pain, no headache, no nausea, no numbness, no shortness of breath, not vomiting and no weakness   Patient has pain in his left chest. States he woke up with that at around 4 this morning. States it feels as if his chest is advised. Is in his upper left chest does go through the back. No numbness or weakness. Denies shortness of breath or cough. He was however found have a pulse ox of 88%. He is a smoker though. He's had previous CABG and has a pacemaker. States this does not feel like anginal pain but does not really remember what that felt like. She had had some swelling in his right leg has had a negative ultrasound.  Past Medical History  Diagnosis Date  . Hypertension   . CAD (coronary artery disease)     a. s/p MI in 1988;  b. s/p CABG in 2000 ([name]) x 4, LIMA to LAD, SVG to diagonal, [facility], SVG to [facility];  c. 12/2013 NSTEMI/abnl MV;  d. 01/2014 Cath/PCI: native 3VD, VG->RCA 100, VG->OM aneurysmal 40ost/m, 5168m(5.0x12 Veriflex BMS), VG->Diag aneurysmal, LIMA->LAD nl.  . TIA (transient ischemic attack)   . Hyperlipidemia   . COPD (chronic obstructive pulmonary disease) (HCC)   . CVA (cerebral infarction) 07/2011    Remote left brain infarct with sensory deficits and had a left internal carotid stent placed by Dr. Corliss Skainseveshwar at that time, no recurrent CVA or TIAs or CNS events.  Marland Kitchen. PAD (peripheral artery disease) (HCC)     Stable. Had a past RFPBG by Dr. Arbie CookeyEarly in 2009. this graft is occluded with reconstitution in the popliteal artery, which was patent. Right tibial was occluded and 2-vessel runoff on angiography done by Dr. Myra GianottiBrabham in  12/2010.  . Sick sinus syndrome (HCC)     PPM implanted for this and syncope   . Chronic systolic CHF (congestive heart failure) (HCC)     Hospitalization for CHF from 05/04/13-05/06/13. Treated medically. Had possible pneumonia with short course of antibiotics.  . Abdominal bruit     Loud  . AAA (abdominal aortic aneurysm) (HCC)     Has a known AAA of 3.2 x 3.4 cm by abdominal untrasound 03/27/12. Followup abdominal ultrasound on 03/29/13 showed a diameter of 3.6 cm, which is stable  . Peripheral vascular disease (HCC)   . Hypothyroid     On supplement "don't know if I take RX or not for my thyroid" (01/16/2014)  . Stroke (HCC)   . Syncope     tilt table test/8.10.01/orthostatic hypotension  . Orthostatic hypotension   . Presence of permanent cardiac pacemaker   . Pneumonia   . Headache    Past Surgical History  Procedure Laterality Date  . Femoral bypass Right ?2001  . Carotid stent Left 06/23/11    By Dr. Myra GianottiBrabham  . Coronary artery bypass graft  2000    1st in 2000 ([name]) x 4, LIMA to LAD, SVG to diagonal, SVG to OM4, SVG to PDA. Re-cath in May 2012 with patent LIMA to LAD, patent SVG to  diagonal, patent SVG to OM with left-to-right collaterals to an occluded right.  . Coronary angioplasty with stent placement  2001-01/16/2014    "think today makes #4" (01/16/2014)  . Cardiac catheterization  2000  . Inguinal hernia repair Right 1980's  . Insert / replace / remove pacemaker      implanted 2003 by Dr Jenne Campus with gen change (MDT [id number]) 08/2010 by [facility]  . Cataract extraction w/ intraocular lens  implant, bilateral Bilateral   . Abdominal aortagram N/A 02/22/2012    Procedure: ABDOMINAL AORTAGRAM;  Surgeon: Nada Libman, MD;  Location: [facility] CATH LAB;  Service: Cardiovascular;  Laterality: N/A;  . Carotid angiogram Bilateral 02/22/2012    Procedure: CAROTID ANGIOGRAM;  Surgeon: Nada Libman, MD;  Location: [facility] CATH LAB;  Service: Cardiovascular;  Laterality: Bilateral;  . Left heart  catheterization with coronary/graft angiogram N/A 01/16/2014    Procedure: LEFT HEART CATHETERIZATION WITH Isabel Caprice;  Surgeon: Kathleene Hazel, MD;  Location: [facility] CATH LAB;  Service: Cardiovascular;  Laterality: N/A;  . Abdominal aortagram    . Colonoscopy    . Peripheral vascular catheterization N/A 06/24/2015    Procedure: Abdominal Aortogram;  Surgeon: Nada Libman, MD;  Location: [facility] INVASIVE CV LAB;  Service: Cardiovascular;  Laterality: N/A;  . Endarterectomy femoral Right 06/25/2015    Procedure: ENDARTERECTOMY RIGHT FEMORAL ARTERY ;  Surgeon: Larina Earthly, MD;  Location: [facility] OR;  Service: Vascular;  Laterality: Right;  . Patch angioplasty Right 06/25/2015    Procedure: RIGHT COMMON FEMORAL ARTERY AND PROFUNDA PATCH ANGIOPLASTY USING HEMASHIELD PLATINUM FINESSE PATCH ;  Surgeon: Larina Earthly, MD;  Location: [facility] OR;  Service: Vascular;  Laterality: Right;   Family History  Problem Relation Age of Onset  . Cancer Brother 60    oral   . Prostate cancer Brother   . Hypertension Brother   . Hyperlipidemia Brother   . Lung disease Father     black lung disease   Social History  Substance Use Topics  . Smoking status: Former Smoker -- 0.50 packs/day for 68 years    Types: Cigarettes    Quit date: 05/02/2015  . Smokeless tobacco: Never Used  . Alcohol Use: No    Review of Systems  Constitutional: Negative for activity change and appetite change.  Eyes: Negative for pain.  Respiratory: Negative for chest tightness and shortness of breath.   Cardiovascular: Positive for chest pain and leg swelling.  Gastrointestinal: Negative for nausea, vomiting, abdominal pain and diarrhea.  Genitourinary: Negative for flank pain.  Musculoskeletal: Negative for back pain and neck stiffness.  Skin: Negative for rash.  Neurological: Negative for weakness, numbness and headaches.  Psychiatric/Behavioral: Negative for behavioral problems.      Allergies  Pravachol;  Simvastatin; Other; Oxycodone; and Terazosin  Home Medications   Prior to Admission medications   Medication Sig Start Date End Date Taking? Authorizing Provider  acetaminophen (TYLENOL) 325 MG tablet Take 650 mg by mouth every 6 (six) hours as needed. Reported on 11/25/2015   Yes Historical Provider, MD  amLODipine (NORVASC) 5 MG tablet TAKE 1 TABLET (5 MG TOTAL) BY MOUTH DAILY. 02/26/16  Yes Lars Masson, MD  aspirin 81 MG tablet Take 81 mg by mouth daily.   Yes Historical Provider, MD  atorvastatin (LIPITOR) 40 MG tablet Take 1 tablet (40 mg total) by mouth daily at 6 PM. 05/14/15  Yes Brittainy Sherlynn Carbon, PA-C  clopidogrel (PLAVIX) 75 MG tablet Take 1 tablet (75 mg total) by  mouth at bedtime. Resume in 1 week. 07/11/15  Yes Rodolph Bong, MD  furosemide (LASIX) 20 MG tablet Take 1 tablet (20 mg total) by mouth daily. 02/26/16  Yes Lars Masson, MD  isosorbide mononitrate (IMDUR) 30 MG 24 hr tablet TAKE 1 TABLET (30 MG TOTAL) BY MOUTH DAILY. 02/26/16  Yes Lars Masson, MD  metoprolol succinate (TOPROL-XL) 100 MG 24 hr tablet Take 100 mg by mouth daily. Take with or immediately following a meal.   Yes Historical Provider, MD  potassium chloride (K-DUR) 10 MEQ tablet Take 1 tablet (10 mEq total) by mouth daily. 02/26/16  Yes Lars Masson, MD  Tamsulosin HCl (FLOMAX) 0.4 MG CAPS Take 0.4 mg by mouth daily after supper.    Yes Historical Provider, MD  nitroGLYCERIN (NITROSTAT) 0.4 MG SL tablet Place 1 tablet (0.4 mg total) under the tongue every 5 (five) minutes as needed. For chest pain 10/27/15   Lars Masson, MD   BP 151/101 mmHg  Pulse 77  Temp(Src) 98.1 F (36.7 C)  Resp 22  Wt 143 lb (64.864 kg)  SpO2 91% Physical Exam  Constitutional: He appears well-developed.  HENT:  Head: Atraumatic.  Neck: Neck supple.  Cardiovascular: Normal rate.   Pulmonary/Chest: No respiratory distress.  Pacemaker to left upper chest wall.  Abdominal: Soft. He exhibits no  distension.  Musculoskeletal: He exhibits edema.  Mild edema to right lower leg.  Neurological: He is alert.  Skin: Skin is warm.    ED Course  Procedures (including critical care time) Labs Review Labs Reviewed  CBC WITH DIFFERENTIAL/PLATELET - Abnormal; Notable for the following:    Platelets 123 (*)    All other components within normal limits  BRAIN NATRIURETIC PEPTIDE - Abnormal; Notable for the following:    B Natriuretic Peptide 2999.7 (*)    All other components within normal limits  TROPONIN I - Abnormal; Notable for the following:    Troponin I 0.60 (*)    All other components within normal limits  I-STAT CHEM 8, ED - Abnormal; Notable for the following:    Sodium 147 (*)    Potassium 3.4 (*)    Creatinine, Ser 1.50 (*)    Glucose, Bld 105 (*)    Calcium, Ion 1.05 (*)    All other components within normal limits  I-STAT TROPOININ, ED - Abnormal; Notable for the following:    Troponin i, poc 0.37 (*)    All other components within normal limits  TROPONIN I  TROPONIN I    Imaging Review Dg Chest Portable 1 View  03/11/2016  CLINICAL DATA:  Acute left-sided chest pain. EXAM: PORTABLE CHEST 1 VIEW COMPARISON:  May 13, 2015. FINDINGS: Stable cardiomegaly. Status post Coronary artery bypass graft. Left-sided pacemaker is unchanged in position. No pneumothorax is noted. Descending thoracic aortic enlargement is again noted consistent with aneurysm. Increased perihilar and bibasilar interstitial densities are noted concerning for pulmonary edema. No significant pleural effusion is noted. IMPRESSION: Continued presence of descending thoracic aortic aneurysm as noted on prior exams. Stable cardiomegaly. Increased perihilar and bibasilar interstitial densities are noted concerning for pulmonary edema. Electronically Signed   By: Lupita Raider, M.D.   On: 03/11/2016 08:38   I have personally reviewed and evaluated these images and lab results as part of my medical  decision-making.   EKG Interpretation   Date/Time:  Thursday March 11 2016 08:05:15 EDT Ventricular Rate:  75 PR Interval:  242 QRS Duration: 110 QT Interval:  435 QTC Calculation: 486 R Axis:   -50 Text Interpretation:  Sinus rhythm Multiform ventricular premature  complexes Prolonged PR interval Probable left atrial enlargement LVH with  secondary repolarization abnormality Borderline prolonged QT interval  Confirmed by Rubin Payor  MD, Samaad Hashem [phone]) on 03/11/2016 8:09:07 AM      MDM   Final diagnoses:  Hypertensive urgency  Chest pain, unspecified chest pain type    Patient with chest pain elevated troponin. Also elevated blood pressure. Has known coronary artery disease. Also previous thoracic aortic aneurysm. Pain in his chest will come and go at this time. Troponin is mildly elevated but EKG is reassuring. BNP is moderately elevated. Will give single dose of Lasix and admit to cardiology.  CRITICAL CARE Performed by: Billee Cashing Total critical care time: 30 minutes Critical care time was exclusive of separately billable procedures and treating other patients. Critical care was necessary to treat or prevent imminent or life-threatening deterioration. Critical care was time spent personally by me on the following activities: development of treatment plan with patient and/or surrogate as well as nursing, discussions with consultants, evaluation of patient's response to treatment, examination of patient, obtaining history from patient or surrogate, ordering and performing treatments and interventions, ordering and review of laboratory studies, ordering and review of radiographic studies, pulse oximetry and re-evaluation of patient's condition.    Benjiman Core, MD 03/11/16 [phone]

## 2016-03-12 ENCOUNTER — Encounter (HOSPITAL_COMMUNITY)
Admission: EM | Disposition: A | Discharge status: Home / Self Care | Payer: Self-pay | Source: Home / Self Care | Attending: Emergency Medicine

## 2016-03-12 DIAGNOSIS — Z8673 Personal history of transient ischemic attack (TIA), and cerebral infarction without residual deficits: Secondary | ICD-10-CM | POA: Diagnosis not present

## 2016-03-12 DIAGNOSIS — I2510 Atherosclerotic heart disease of native coronary artery without angina pectoris: Secondary | ICD-10-CM | POA: Diagnosis not present

## 2016-03-12 DIAGNOSIS — I25700 Atherosclerosis of coronary artery bypass graft(s), unspecified, with unstable angina pectoris: Secondary | ICD-10-CM

## 2016-03-12 DIAGNOSIS — I252 Old myocardial infarction: Secondary | ICD-10-CM | POA: Diagnosis not present

## 2016-03-12 DIAGNOSIS — R079 Chest pain, unspecified: Secondary | ICD-10-CM | POA: Diagnosis not present

## 2016-03-12 DIAGNOSIS — I161 Hypertensive emergency: Secondary | ICD-10-CM

## 2016-03-12 DIAGNOSIS — I160 Hypertensive urgency: Secondary | ICD-10-CM | POA: Diagnosis not present

## 2016-03-12 DIAGNOSIS — I495 Sick sinus syndrome: Secondary | ICD-10-CM

## 2016-03-12 DIAGNOSIS — I5042 Chronic combined systolic (congestive) and diastolic (congestive) heart failure: Secondary | ICD-10-CM

## 2016-03-12 DIAGNOSIS — I214 Non-ST elevation (NSTEMI) myocardial infarction: Secondary | ICD-10-CM | POA: Diagnosis not present

## 2016-03-12 LAB — HEPARIN LEVEL (UNFRACTIONATED)
Heparin Unfractionated: 0.18 IU/mL — ABNORMAL LOW (ref 0.30–0.70)
Heparin Unfractionated: <0.1 IU/mL — ABNORMAL LOW (ref 0.30–0.70)

## 2016-03-12 LAB — PROTIME-INR
INR: 1.24 (ref 0.00–1.49)
PROTHROMBIN TIME: 15.7 s — AB (ref 11.6–15.2)

## 2016-03-12 LAB — BASIC METABOLIC PANEL
ANION GAP: 10 (ref 5–15)
Anion gap: 8 (ref 5–15)
BUN: 19 mg/dL (ref 6–20)
BUN: 18 mg/dL (ref 6–20)
CALCIUM: 7.9 mg/dL — AB (ref 8.9–10.3)
CHLORIDE: 107 mmol/L (ref 101–111)
CO2: 26 mmol/L (ref 22–32)
CO2: 27 mmol/L (ref 22–32)
CREATININE: 1.79 mg/dL — AB (ref 0.61–1.24)
Calcium: 7.8 mg/dL — ABNORMAL LOW (ref 8.9–10.3)
Chloride: 107 mmol/L (ref 101–111)
Creatinine, Ser: 1.92 mg/dL — ABNORMAL HIGH (ref 0.61–1.24)
GFR calc non Af Amer: 30 mL/min — ABNORMAL LOW (ref >60)
GFR calc non Af Amer: 32 mL/min — ABNORMAL LOW (ref >60)
GFR, EST AFRICAN AMERICAN: 34 mL/min — AB (ref >60)
GFR, EST AFRICAN AMERICAN: 38 mL/min — AB (ref >60)
Glucose, Bld: 93 mg/dL (ref 65–99)
Glucose, Bld: 85 mg/dL (ref 65–99)
POTASSIUM: 3.6 mmol/L (ref 3.5–5.1)
Potassium: 3.2 mmol/L — ABNORMAL LOW (ref 3.5–5.1)
SODIUM: 143 mmol/L (ref 135–145)
SODIUM: 142 mmol/L (ref 135–145)

## 2016-03-12 LAB — CBC
HCT: 38 % — ABNORMAL LOW (ref 39.0–52.0)
Hemoglobin: 12.5 g/dL — ABNORMAL LOW (ref 13.0–17.0)
MCH: 31.3 pg (ref 26.0–34.0)
MCHC: 32.9 g/dL (ref 30.0–36.0)
MCV: 95 fL (ref 78.0–100.0)
Platelets: 126 10*3/uL — ABNORMAL LOW (ref 150–400)
RBC: 4 MIL/uL — ABNORMAL LOW (ref 4.22–5.81)
RDW: 15.2 % (ref 11.5–15.5)
WBC: 8.5 10*3/uL (ref 4.0–10.5)

## 2016-03-12 LAB — TROPONIN I
TROPONIN I: 0.46 ng/mL — AB (ref <0.031)
Troponin I: 0.44 ng/mL — ABNORMAL HIGH (ref <0.031)

## 2016-03-12 SURGERY — LEFT HEART CATH AND CORONARY ANGIOGRAPHY

## 2016-03-12 MED ORDER — ASPIRIN 81 MG PO CHEW
81.0000 mg | CHEWABLE_TABLET | ORAL | Status: DC
Start: 2016-03-13 — End: 2016-03-12

## 2016-03-12 MED ORDER — ISOSORBIDE MONONITRATE ER 30 MG PO TB24
30.0000 mg | ORAL_TABLET | Freq: Every day | ORAL | Status: DC
  Administered 2016-03-12: 30 mg via ORAL
  Filled 2016-03-12: qty 1

## 2016-03-12 MED ORDER — POTASSIUM CHLORIDE CRYS ER 20 MEQ PO TBCR
40.0000 meq | EXTENDED_RELEASE_TABLET | Freq: Once | ORAL | Status: AC
  Administered 2016-03-12: 40 meq via ORAL
  Filled 2016-03-12: qty 2

## 2016-03-12 MED ORDER — ASPIRIN EC 81 MG PO TBEC
81.0000 mg | DELAYED_RELEASE_TABLET | Freq: Every day | ORAL | Status: DC
  Administered 2016-03-13 – 2016-03-15 (×3): 81 mg via ORAL
  Filled 2016-03-12 (×3): qty 1

## 2016-03-12 MED ORDER — SODIUM CHLORIDE 0.9% FLUSH: 3.0000 mL | INTRAVENOUS | Status: DC | PRN

## 2016-03-12 MED ORDER — ASPIRIN 81 MG PO CHEW
81.0000 mg | CHEWABLE_TABLET | ORAL | Status: AC
  Administered 2016-03-12: 81 mg via ORAL
  Filled 2016-03-12: qty 1

## 2016-03-12 MED ORDER — SODIUM CHLORIDE 0.9 % IV SOLN: 250.0000 mL | INTRAVENOUS | Status: DC | PRN

## 2016-03-12 MED ORDER — SODIUM CHLORIDE 0.9% FLUSH
3.0000 mL | Freq: Two times a day (BID) | INTRAVENOUS | Status: DC
  Administered 2016-03-12 – 2016-03-15 (×7): 3 mL via INTRAVENOUS

## 2016-03-12 MED ORDER — SODIUM CHLORIDE 0.9 % IV SOLN
INTRAVENOUS | Status: DC
Start: 2016-03-12 — End: 2016-03-12
  Administered 2016-03-12: 10 mL/h via INTRAVENOUS

## 2016-03-12 NOTE — Care Management Obs Status (Signed)
MEDICARE OBSERVATION STATUS NOTIFICATION   Patient Details  Name: [name] MRN: [id number] Date of Birth: May 03, 1927   Medicare Observation Status Notification Given:  Yes    Gala LewandowskyGraves-Bigelow, Ronniesha Seibold Kaye, RN 03/12/2016, 3:20 PM

## 2016-03-12 NOTE — Progress Notes (Signed)
Patient Name: [name] Date of Encounter: 03/12/2016  Primary Cardiologist: Dr. Delton See  Pt. Profile:  [name] is a cachectic 82 year old African-American male with past medical history of HTN, HLD, AAA/abdominal bruits, hypothyroidism, orthostatic hypotension, PAD, COPD, SSS s/p Medtronic PPM (placed by Dr. Jenne Campus 2003 with generator change 08/2010), CVA and CAD s/p 4v CABG in 2000 (LIMA to LAD, SVG to diag, SVG to OM4, SVG to PDA) presented with chest pain and SOB that woke him up from sleep this morning at 4:30AM. BNP 3000. SBP 170-190s in ED.   Principal Problem:   NSTEMI (non-ST elevated myocardial infarction) (HCC) Active Problems:   Hyperlipidemia   Tobacco abuse   History of CVA (cerebrovascular accident)   CAD (coronary artery disease) of artery bypass graft   Sick sinus syndrome (HCC) s/p Medtronic PPM   Chronic combined systolic and diastolic CHF (congestive heart failure) (HCC)   COPD with acute exacerbation (HCC)   PAD (peripheral artery disease) (HCC)   Chest pain   Hypertensive urgency    SUBJECTIVE  Denies any CP or SOB overnight.   CURRENT MEDS . amLODipine  10 mg Oral Daily  . [START ON 03/13/2016] aspirin EC  81 mg Oral Daily  . atorvastatin  40 mg Oral q1800  . clopidogrel  75 mg Oral QHS  . metoprolol succinate  100 mg Oral Daily  . potassium chloride  10 mEq Oral Daily  . sodium chloride flush  3 mL Intravenous Q12H  . tamsulosin  0.4 mg Oral QPC supper    OBJECTIVE  Filed Vitals:   03/11/16 2056 03/12/16 0056 03/12/16 0447 03/12/16 0525  BP: 135/86 153/88 151/82   Pulse: 76 74 78   Temp: 98 F (36.7 C) 98.4 F (36.9 C) 98 F (36.7 C)   TempSrc: Oral Oral Oral   Resp: Height:      Weight:    137 lb 2 oz (62.2 kg)  SpO2: 96% 100% 100%     Intake/Output Summary (Last 24 hours) at 03/12/16 0905 Last data filed at 03/12/16 0300  Gross per 24 hour  Intake  59.33 ml  Output   1800 ml  Net -1740.67 ml   Filed Weights   03/11/16 0802 03/11/16 1510 03/12/16 0525  Weight: 143 lb (64.864 kg) 139 lb 9.6 oz (63.322 kg) 137 lb 2 oz (62.2 kg)    PHYSICAL EXAM  General: Pleasant, NAD. Neuro: Alert and oriented X 3. Moves all extremities spontaneously. Psych: Normal affect. HEENT:  Normal  Neck: Supple without bruits or JVD. Lungs:  Resp regular and unlabored, CTA. Heart: RRR no s3, s4, or murmurs. Abdomen: Soft, non-tender, non-distended, BS + x 4.  Extremities: No clubbing, cyanosis or edema. DP/PT/Radials 2+ and equal bilaterally.  Accessory Clinical Findings  CBC  Recent Labs  03/11/16 0834 03/11/16 0835 03/12/16 0415  WBC 9.6  --  8.5  NEUTROABS 6.7  --   --   HGB 13.9 15.3 12.5*  HCT 41.7 45.0 38.0*  MCV 96.3  --  95.0  PLT 123*  --  126*   Basic Metabolic Panel  Recent Labs  03/11/16 0835 03/12/16 0415  NA 147* 143  K 3.4* 3.2*  CL 109 107  CO2  --  26  GLUCOSE 105* 93  BUN 18 19  CREATININE 1.50* 1.92*  CALCIUM  --  7.9*   Cardiac Enzymes  Recent Labs  03/11/16 2108 03/11/16 2314 03/12/16 0415  TROPONINI 0.47* 0.46* 0.44*  TELE Paced rhythm    ECG  NSR with TWI in inferior and anterior leads  Echocardiogram  pending    Radiology/Studies  Dg Chest Portable 1 View  03/11/2016  CLINICAL DATA:  Acute left-sided chest pain. EXAM: PORTABLE CHEST 1 VIEW COMPARISON:  May 13, 2015. FINDINGS: Stable cardiomegaly. Status post Coronary artery bypass graft. Left-sided pacemaker is unchanged in position. No pneumothorax is noted. Descending thoracic aortic enlargement is again noted consistent with aneurysm. Increased perihilar and bibasilar interstitial densities are noted concerning for pulmonary edema. No significant pleural effusion is noted. IMPRESSION: Continued presence of descending thoracic aortic aneurysm as noted on prior exams. Stable cardiomegaly. Increased perihilar and bibasilar interstitial densities are noted concerning for pulmonary edema. Electronically  Signed   By: Lupita Raider, M.D.   On: 03/11/2016 08:38   Ct Angio Chest Aorta W/cm &/or Wo/cm  03/11/2016  CLINICAL DATA:  Sudden onset of chest pain radiating to back, known thoracic aneurysm, hypertension, coronary artery disease, stroke, COPD, hyperlipidemia, T8, CHF, peripheral vascular disease, former smoker EXAM: CT ANGIOGRAPHY CHEST WITH CONTRAST TECHNIQUE: Multidetector CT imaging of the chest was performed using the standard protocol during bolus administration of intravenous contrast. Multiplanar CT image reconstructions and MIPs were obtained to evaluate the vascular anatomy. CONTRAST:  80 cc Isovue 370 IV COMPARISON:  05/13/2015 FINDINGS: No definite intramural hematoma on precontrast imaging. Scattered atherosclerotic calcifications aorta and coronary arteries. Pacemaker leads RIGHT atrium and RIGHT ventricle. Prior median sternotomy and CABG. Aneurysmal dilatation descending thoracic aorta on axial images measuring up to: 4.5 cm image 63 4.7 cm image 85 5.6 cm image 109 5.6 cm image 124 at aortic hiatus. At proximal descending thoracic aorta, inhomogeneous attenuation of contrast enhancement of aortic lumen. A discrete intimal flap is not demonstrated on axial or coronal images. Sagittal images show questionable area of thin linear lucency but inconsistent and incomplete. A definite aortic dissection is not identified ; potentially the inhomogeneous enhancement could be related to an an admixture of opacified and non-opacified blood on the basis of poor cardiac output ; does patient have a significantly decreased LV ejection fraction? Pulmonary arteries grossly patent on non targeted exam. Stomach incompletely distended unable to exclude gastric wall thickening at mid stomach. Cystic lesion RIGHT lobe liver medially 3.0 x 2.2 cm image 134. Remaining visualized upper abdomen unremarkable. Enlarged RIGHT paratracheal lymph node at carina 2.4 cm short axis image 53. Additional mildly prominent  precarinal node 12 mm short axis image 53. Question enlarged sub carinal node 16 mm short axis image 62. BILATERAL pleural effusions with compressive atelectasis of the posterior lungs. No pleural effusion or pneumothorax. Osseous structures unremarkable. Review of the MIP images confirms the above findings. IMPRESSION: Aneurysmal dilatation of descending thoracic aorta up to 5.6 cm diameter at aortic hiatus, 5.2 cm previously. No definite aortic dissection is identified though an area of inhomogeneous aortic luminal enhancement is seen at the proximal descending thoracic aorta, question related to an admixture of opacified non-opacified blood on the basis of poor cardiac output. If patient has persistent pain, may consider followup CT imaging with contrast to reassess aorta. BILATERAL low-attenuation pleural effusions and compressive atelectasis of the posterior lungs. Nonspecific mediastinal adenopathy. Scattered atherosclerotic disease. Electronically Signed   By: Ulyses Southward M.D.   On: 03/11/2016 13:49    ASSESSMENT AND PLAN  1. Chest pain at rest radiating to the back:   - occurred in the setting of hypertensive urgency, unclear if trop elevated related to malignant HTN  vs underlying CAD  - CTA of chest negative for dissection, but did mention enlarging AAA which i have discussed with patient and family that he will need to discuss with his surgeon - Will discuss with M.D., potentially admit the patient trend troponin overnight, if troponin continued to be flat, would focus on BP control and treat possible HF and may consider Myoview as outpatient. If trop go up, would pursue cath - severely elevated BNP of 3000, he does not appears to be significantly fluid overloaded on physical exam, will obtain repeat echocardiogram. He was treated with BID of IV lasix. He does have elevated JVD, however is probably related to his cachectic size, SBP 170-190s and also potential tricuspid  regurgitation.  - his Cr jumped to 1.9 this morning, will stop IV lasix, hydrate him, recheck BMET around noon, hopefully can go ahead and have cath today. EKG today shows more prominent inferior and anterior TWI  2. Hypertensive urgency: Which also likely contributed to the elevated troponin and BNP. We'll need to adjust his medication better, target goal systolic blood pressure 130 to 150s. Current blood pressure 190s in the ED.  3. CAD s/p 4v CABG in 2000 (LIMA to LAD, SVG to diag, SVG to OM4, SVG to PDA)  4. HLD  5. AAA/abdominal bruits: CT of abdomen on 01/28/2016 showed abdominal aortic aneurysm maximal dimension 4.3 cm, increased from 3 secondary in 2013, recommended one-year ultrasound. CTA of chest obtained on 4/6 however indicate aneurysmal dilatation of 5.6 cm  6. Hypothyroidism  7. SSS s/p Medtronic PPM (placed by Dr. [name] 2003 with generator change 08/2010)  8. H/o CVA   Signed, Amedeo PlentyMeng, Hao PA-C Pager: [phone]  The patient was seen, examined and discussed with Azalee CourseHao Meng, PA-C and I agree with the above.   [name] is a very pleasant 82 year old gentleman well known to me with known coronary artery disease, prior CABG 4 in 2000 and stenting in in 2015, he has chronic systolic CHF with diffuse hypokinesis and LVEF 45% he also has AAA and is status post pacemaker placement for sick sinus syndrome in 2011.  The patient woke up last night with chest pain and presented to the ER, on initial presentation he has chronic lower extremity edema is present in his lungs. His chest x-ray shows, edema, his BP is 3000 and troponin is mildly elevated 0.37. His creatinine is 1.5, baseline being 1.7-2.2. We will start treatment for acute on chronic systolic CHF with Lasix 40 mg by mouth IV twice a day, replacements of hypokalemia with 40 mEq of potassium daily. We'll continue cycling troponins and start heparin until they are downtrending. We will increase his amlodipine dose to 10 mg daily  and currently systolic blood pressure is 180-190 mmHg.  Chest CT showed no aortic dissection, aneurysmal dilatation of descending thoracic aorta up to 5.6 cm diameter at aortic hiatus, 5.2 cm previously.   The patient is now asymptomatic free of chest pain BP 250 --> 150 range this am. This is most probably a presentation of hypertensive emergency. We will cancel a cardiac cath, his troponin had flat trend 0.49 --> 0.44, we will discontinue heparin drip, we will work on weaning iv NTG today and optimizing oral antihypertensives.  Crea 1.5 --> 1.9, baseline 1.7 --> 2.1. He is getting careful iv fluids post CTA yesterday, he is euvolemic.   BP meds plan: Continue amlodipine 10 mg po daily, metoprolol 100 mg po BID, add imdur 30 mg daily. Wean iv NTG  throughout the day.  Anticipated discharge tomorrow if BP controlled.  Lars Masson 03/12/2016

## 2016-03-12 NOTE — Plan of Care (Signed)
Problem: Phase III Progression Outcomes Goal: Hemodynamically stable Outcome: Progressing VS Stable Goal: No anginal pain Outcome: Progressing No C/O pain

## 2016-03-12 NOTE — Progress Notes (Signed)
ANTICOAGULATION CONSULT NOTE - Follow-up Consult  Pharmacy Consult for Heparin  Indication: ACS with Descending aortic aneurysm  Allergies  Allergen Reactions  . Pravachol Other (See Comments)    myalgias  . Simvastatin Other (See Comments)    myalgia  . Other Other (See Comments)    GREEN BEANS AND ONIONS  . Oxycodone Other (See Comments)    Family member reports "he was highly agitated and disoriented"   . Terazosin Other (See Comments)    unknown    Patient Measurements: Height: 5\' 9"  (175.3 cm) Weight: 139 lb 9.6 oz (63.322 kg) IBW/kg (Calculated) : 70.7 Heparin Dosing Weight: 64 kg   Vital Signs: Temp: 98 F (36.7 C) (04/06 2056) Temp Source: Oral (04/06 2056) BP: 135/86 mmHg (04/06 2056) Pulse Rate: 76 (04/06 2056)  Labs:  Recent Labs  03/11/16 0834 03/11/16 0835  03/11/16 1601 03/11/16 2108 03/11/16 2314 03/11/16 2335  HGB 13.9 15.3  --   --   --   --   --   HCT 41.7 45.0  --   --   --   --   --   PLT 123*  --   --   --   --   --   --   HEPARINUNFRC  --   --   --   --   --   --  <0.10*  CREATININE  --  1.50*  --   --   --   --   --   TROPONINI  --   --   < > 0.49* 0.47* 0.46*  --   < > = values in this interval not displayed.  Estimated Creatinine Clearance: 31.1 mL/min (by C-G formula based on Cr of 1.5).   Assessment: [age] YOM who presented with chest pain and SOB. SBP on presentation was 170-190s in the ED. CT chest showed a 5.6 cm descending aortic aneurysm. Pharmacy consulted to start IV heparin without bolus. Heparin level undetectable on 800 units/hr. No issues with line or bleeding reported per RN. Plan for cath today -scheduled at 0730.  Goal of Therapy:  Heparin level 0.3-0.7 units/ml Monitor platelets by anticoagulation protocol: Yes   Plan:  -Increase heparin to 1050 units/hr -F/u post cath  Christoper Fabianaron Soren Lazarz, PharmD, BCPS Clinical pharmacist, pager [phone] 03/12/2016 12:35 AM

## 2016-03-13 ENCOUNTER — Observation Stay (HOSPITAL_BASED_OUTPATIENT_CLINIC_OR_DEPARTMENT_OTHER): Payer: Medicare Other

## 2016-03-13 DIAGNOSIS — R079 Chest pain, unspecified: Secondary | ICD-10-CM | POA: Diagnosis not present

## 2016-03-13 DIAGNOSIS — I2510 Atherosclerotic heart disease of native coronary artery without angina pectoris: Secondary | ICD-10-CM | POA: Diagnosis not present

## 2016-03-13 DIAGNOSIS — Z720 Tobacco use: Secondary | ICD-10-CM

## 2016-03-13 DIAGNOSIS — I214 Non-ST elevation (NSTEMI) myocardial infarction: Secondary | ICD-10-CM | POA: Diagnosis not present

## 2016-03-13 DIAGNOSIS — I209 Angina pectoris, unspecified: Secondary | ICD-10-CM | POA: Diagnosis not present

## 2016-03-13 DIAGNOSIS — I25708 Atherosclerosis of coronary artery bypass graft(s), unspecified, with other forms of angina pectoris: Secondary | ICD-10-CM

## 2016-03-13 DIAGNOSIS — I252 Old myocardial infarction: Secondary | ICD-10-CM | POA: Diagnosis not present

## 2016-03-13 DIAGNOSIS — I160 Hypertensive urgency: Secondary | ICD-10-CM | POA: Diagnosis not present

## 2016-03-13 DIAGNOSIS — I739 Peripheral vascular disease, unspecified: Secondary | ICD-10-CM

## 2016-03-13 LAB — CBC
HCT: 39.9 % (ref 39.0–52.0)
Hemoglobin: 12.9 g/dL — ABNORMAL LOW (ref 13.0–17.0)
MCH: 31.2 pg (ref 26.0–34.0)
MCHC: 32.3 g/dL (ref 30.0–36.0)
MCV: 96.4 fL (ref 78.0–100.0)
Platelets: 151 10*3/uL (ref 150–400)
RBC: 4.14 MIL/uL — ABNORMAL LOW (ref 4.22–5.81)
RDW: 15.1 % (ref 11.5–15.5)
WBC: 7.6 10*3/uL (ref 4.0–10.5)

## 2016-03-13 LAB — HEPARIN LEVEL (UNFRACTIONATED): Heparin Unfractionated: <0.1 IU/mL — ABNORMAL LOW (ref 0.30–0.70)

## 2016-03-13 LAB — ECHOCARDIOGRAM COMPLETE
HEIGHTINCHES: 69 in
Weight: 2225.76 oz

## 2016-03-13 MED ORDER — HYDRALAZINE HCL 25 MG PO TABS
25.0000 mg | ORAL_TABLET | Freq: Three times a day (TID) | ORAL | Status: DC
  Administered 2016-03-13 – 2016-03-14 (×4): 25 mg via ORAL
  Filled 2016-03-13 (×4): qty 1

## 2016-03-13 MED ORDER — FUROSEMIDE 20 MG PO TABS
20.0000 mg | ORAL_TABLET | Freq: Every day | ORAL | Status: DC
  Administered 2016-03-13 – 2016-03-15 (×3): 20 mg via ORAL
  Filled 2016-03-13 (×3): qty 1

## 2016-03-13 MED ORDER — LORAZEPAM 0.5 MG PO TABS
0.5000 mg | ORAL_TABLET | Freq: Two times a day (BID) | ORAL | Status: DC | PRN
  Administered 2016-03-13 – 2016-03-15 (×4): 0.5 mg via ORAL
  Filled 2016-03-13 (×4): qty 1

## 2016-03-13 MED ORDER — ISOSORBIDE MONONITRATE ER 60 MG PO TB24
60.0000 mg | ORAL_TABLET | Freq: Every day | ORAL | Status: DC
  Administered 2016-03-13 – 2016-03-14 (×2): 60 mg via ORAL
  Filled 2016-03-13 (×2): qty 1

## 2016-03-13 MED ORDER — HEPARIN SODIUM (PORCINE) 5000 UNIT/ML IJ SOLN
5000.0000 [IU] | Freq: Three times a day (TID) | INTRAMUSCULAR | Status: DC
  Administered 2016-03-13 – 2016-03-15 (×6): 5000 [IU] via SUBCUTANEOUS
  Filled 2016-03-13 (×7): qty 1

## 2016-03-13 NOTE — Progress Notes (Signed)
Patient's BP at 05:49 AM 192/89. Norvasc 10 mg and Metoprolol 100  Mg scheduled for 10:00 AM given at 05:55 AM. BP rechecked at 06:50 AM 180/103.  Will continue to monitor.

## 2016-03-13 NOTE — Progress Notes (Signed)
Patient complained of 10/10 chest pain. EKG done and shows no changes.  Nitro sublingual x 3 given and was effective; pain down 1/10.  Patient is currently in bed with eyes closed. Will continue to monitor.

## 2016-03-13 NOTE — Plan of Care (Signed)
Problem: Consults Goal: Tobacco Cessation referral if indicated Outcome: Progressing Cessation resource provided.

## 2016-03-13 NOTE — Progress Notes (Signed)
  Echocardiogram 2D Echocardiogram has been performed.  [name], Lovett Coffin M 03/13/2016, 12:14 PM

## 2016-03-13 NOTE — Progress Notes (Signed)
Voided 100 ml amber, clear urine. C/O sensation of full bladder and inability to empty bladder. Post Void Residual = 72 ml per Bladder Scan. Azalee CourseHao Meng, [place] made aware. Orders received.

## 2016-03-13 NOTE — Progress Notes (Signed)
Patient Name: [name] Date of Encounter: 03/13/2016  Primary Cardiologist: Dr. Delton See  Pt. Profile:  [name] is a cachectic 82 year old African-American male with past medical history of HTN, HLD, AAA/abdominal bruits, hypothyroidism, orthostatic hypotension, PAD, COPD, SSS s/p Medtronic PPM (placed by Dr. Jenne Campus 2003 with generator change 08/2010), CVA and CAD s/p 4v CABG in 2000 (LIMA to LAD, SVG to diag, SVG to OM4, SVG to PDA) presented with chest pain and SOB that woke him up from sleep this morning at 4:30AM. BNP 3000. SBP 170-190s in ED.   Principal Problem:   NSTEMI (non-ST elevated myocardial infarction) (HCC) Active Problems:   Hyperlipidemia   Tobacco abuse   History of CVA (cerebrovascular accident)   CAD (coronary artery disease) of artery bypass graft   Sick sinus syndrome (HCC) s/p Medtronic PPM   Chronic combined systolic and diastolic CHF (congestive heart failure) (HCC)   COPD with acute exacerbation (HCC)   PAD (peripheral artery disease) (HCC)   Chest pain   Hypertensive urgency    SUBJECTIVE  Some CP last night, feeling "swammy-headed" when his BP were in 180-190s. No SOB.   CURRENT MEDS . amLODipine  10 mg Oral Daily  . aspirin EC  81 mg Oral Daily  . atorvastatin  40 mg Oral q1800  . clopidogrel  75 mg Oral QHS  . furosemide  20 mg Oral Daily  . hydrALAZINE  25 mg Oral 3 times per day  . isosorbide mononitrate  60 mg Oral Daily  . metoprolol succinate  100 mg Oral Daily  . potassium chloride  10 mEq Oral Daily  . sodium chloride flush  3 mL Intravenous Q12H  . tamsulosin  0.4 mg Oral QPC supper    OBJECTIVE  Filed Vitals:   03/13/16 0217 03/13/16 0500 03/13/16 0549 03/13/16 0740  BP: 147/67 178/107 192/89 181/94  Pulse:  77 72   Temp:  97.5 F (36.4 C)  97.4 F (36.3 C)  TempSrc:  Oral  Oral  Resp:  22  20  Height:      Weight:  139 lb 1.8 oz (63.1 kg)    SpO2:  91%  92%    Intake/Output Summary (Last 24 hours) at 03/13/16  0950 Last data filed at 03/13/16 0830  Gross per 24 hour  Intake 811.34 ml  Output    700 ml  Net 111.34 ml   Filed Weights   03/11/16 1510 03/12/16 0525 03/13/16 0500  Weight: 139 lb 9.6 oz (63.322 kg) 137 lb 2 oz (62.2 kg) 139 lb 1.8 oz (63.1 kg)    PHYSICAL EXAM  General: Pleasant, NAD. Neuro: Alert and oriented X 3. Moves all extremities spontaneously. Psych: Normal affect. HEENT:  Normal  Neck: Supple without bruits or JVD. Lungs:  Resp regular and unlabored, CTA. Heart: RRR no s3, s4, or murmurs. Abdomen: Soft, non-tender, non-distended, BS + x 4.  Extremities: No clubbing, cyanosis or edema. DP/PT/Radials 2+ and equal bilaterally.  Accessory Clinical Findings  CBC  Recent Labs  03/11/16 0834  03/12/16 0415 03/13/16 0543  WBC 9.6  --  8.5 7.6  NEUTROABS 6.7  --   --   --   HGB 13.9  < > 12.5* 12.9*  HCT 41.7  < > 38.0* 39.9  MCV 96.3  --  95.0 96.4  PLT 123*  --  126* 151  < > = values in this interval not displayed. Basic Metabolic Panel  Recent Labs  03/12/16 0415 03/12/16 1240  NA  143 142  K 3.2* 3.6  CL 107 107  CO2 26 27  GLUCOSE 93 85  BUN 19 18  CREATININE 1.92* 1.79*  CALCIUM 7.9* 7.8*   Cardiac Enzymes  Recent Labs  03/11/16 2108 03/11/16 2314 03/12/16 0415  TROPONINI 0.47* 0.46* 0.44*    TELE Paced rhythm    ECG  NSR with TWI in inferior and anterior leads  Echocardiogram  pending    Radiology/Studies  Dg Chest Portable 1 View  03/11/2016  CLINICAL DATA:  Acute left-sided chest pain. EXAM: PORTABLE CHEST 1 VIEW COMPARISON:  May 13, 2015. FINDINGS: Stable cardiomegaly. Status post Coronary artery bypass graft. Left-sided pacemaker is unchanged in position. No pneumothorax is noted. Descending thoracic aortic enlargement is again noted consistent with aneurysm. Increased perihilar and bibasilar interstitial densities are noted concerning for pulmonary edema. No significant pleural effusion is noted. IMPRESSION: Continued  presence of descending thoracic aortic aneurysm as noted on prior exams. Stable cardiomegaly. Increased perihilar and bibasilar interstitial densities are noted concerning for pulmonary edema. Electronically Signed   By: Lupita Raider, M.D.   On: 03/11/2016 08:38   Ct Angio Chest Aorta W/cm &/or Wo/cm  03/11/2016  CLINICAL DATA:  Sudden onset of chest pain radiating to back, known thoracic aneurysm, hypertension, coronary artery disease, stroke, COPD, hyperlipidemia, T8, CHF, peripheral vascular disease, former smoker EXAM: CT ANGIOGRAPHY CHEST WITH CONTRAST TECHNIQUE: Multidetector CT imaging of the chest was performed using the standard protocol during bolus administration of intravenous contrast. Multiplanar CT image reconstructions and MIPs were obtained to evaluate the vascular anatomy. CONTRAST:  80 cc Isovue 370 IV COMPARISON:  05/13/2015 FINDINGS: No definite intramural hematoma on precontrast imaging. Scattered atherosclerotic calcifications aorta and coronary arteries. Pacemaker leads RIGHT atrium and RIGHT ventricle. Prior median sternotomy and CABG. Aneurysmal dilatation descending thoracic aorta on axial images measuring up to: 4.5 cm image 63 4.7 cm image 85 5.6 cm image 109 5.6 cm image 124 at aortic hiatus. At proximal descending thoracic aorta, inhomogeneous attenuation of contrast enhancement of aortic lumen. A discrete intimal flap is not demonstrated on axial or coronal images. Sagittal images show questionable area of thin linear lucency but inconsistent and incomplete. A definite aortic dissection is not identified ; potentially the inhomogeneous enhancement could be related to an an admixture of opacified and non-opacified blood on the basis of poor cardiac output ; does patient have a significantly decreased LV ejection fraction? Pulmonary arteries grossly patent on non targeted exam. Stomach incompletely distended unable to exclude gastric wall thickening at mid stomach. Cystic lesion  RIGHT lobe liver medially 3.0 x 2.2 cm image 134. Remaining visualized upper abdomen unremarkable. Enlarged RIGHT paratracheal lymph node at [place] 2.4 cm short axis image 53. Additional mildly prominent precarinal node 12 mm short axis image 53. Question enlarged sub carinal node 16 mm short axis image 62. BILATERAL pleural effusions with compressive atelectasis of the posterior lungs. No pleural effusion or pneumothorax. Osseous structures unremarkable. Review of the MIP images confirms the above findings. IMPRESSION: Aneurysmal dilatation of descending thoracic aorta up to 5.6 cm diameter at aortic hiatus, 5.2 cm previously. No definite aortic dissection is identified though an area of inhomogeneous aortic luminal enhancement is seen at the proximal descending thoracic aorta, question related to an admixture of opacified non-opacified blood on the basis of poor cardiac output. If patient has persistent pain, may consider followup CT imaging with contrast to reassess aorta. BILATERAL low-attenuation pleural effusions and compressive atelectasis of the posterior lungs. Nonspecific mediastinal  adenopathy. Scattered atherosclerotic disease. Electronically Signed   By: Ulyses SouthwardMark  Boles M.D.   On: 03/11/2016 13:49    ASSESSMENT AND PLAN  1. Chest pain at rest radiating to the back:   - occurred in the setting of hypertensive urgency, unclear if trop elevated related to malignant HTN vs underlying CAD  - CTA of chest negative for dissection, but did mention enlarging AAA which i have discussed with patient and family that he will need to discuss with his surgeon - Will discuss with M.D., potentially admit the patient trend troponin overnight, if troponin continued to be flat, would focus on BP control and treat possible HF and may consider Myoview as outpatient. If trop go up, would pursue cath - severely elevated BNP of 3000, he does not appears to be significantly fluid overloaded on  physical exam, will obtain repeat echocardiogram. He was treated with BID of IV lasix. He does have elevated JVD, however is probably related to his cachectic size, SBP 170-190s and also potential tricuspid regurgitation.  - Originally planned for cath, however renal function worsened, serial came back flat, Dr. Delton SeeNelson reviewed his record and felt CP maybe related to uncontrolled BP. He had a CTA of abdomen and pelvis in Feb 2017, will discuss with MD to see if he needs a renal artery U/S  2. Hypertensive urgency: Which also likely contributed to the elevated troponin and BNP. We'll need to adjust his medication better, target goal systolic blood pressure 130 to 150s.   - BP still very high, will increase Imdur to 60mg  daily, add hydralazine 25mg  TID. Restart home dose of lasix.   3. CAD s/p 4v CABG in 2000 (LIMA to LAD, SVG to diag, SVG to OM4, SVG to PDA)  4. HLD  5. AAA/abdominal bruits: CT of abdomen on 01/28/2016 showed abdominal aortic aneurysm maximal dimension 4.3 cm, increased from 3 secondary in 2013, recommended one-year ultrasound. CTA of chest obtained on 4/6 however indicate aneurysmal dilatation of 5.6 cm  6. Hypothyroidism  7. SSS s/p Medtronic PPM (placed by Dr. [name] 2003 with generator change 08/2010)  8. H/o CVA   Signed, Amedeo PlentyMeng, Hao PA-C Pager: [phone]  The patient was seen and examined, and I agree with the physical exam, assessment and plan as documented above which has been discussed with Harrell LarkH. Meng PA-C, with modifications as noted below. Pt denies chest pain currently but had some last night. Says he was told he was short of breath by his son when talking on the phone earlier.  BP remains elevated. Given thoracic aortic aneurysm, will aim to more optimally control BP with increase in Imdur dose and addition of hydralazine. Discussed plan of care with RN. If BP more optimally controlled and patient is symptomatically stable, will plan to discharge tomorrow.  Prentice DockerSuresh  Smitty Ackerley, MD, [facility] LtdFACC  03/13/2016 10:00 AM

## 2016-03-14 DIAGNOSIS — I160 Hypertensive urgency: Secondary | ICD-10-CM | POA: Diagnosis not present

## 2016-03-14 DIAGNOSIS — I209 Angina pectoris, unspecified: Secondary | ICD-10-CM | POA: Diagnosis not present

## 2016-03-14 DIAGNOSIS — I2510 Atherosclerotic heart disease of native coronary artery without angina pectoris: Secondary | ICD-10-CM | POA: Diagnosis not present

## 2016-03-14 DIAGNOSIS — R079 Chest pain, unspecified: Secondary | ICD-10-CM | POA: Diagnosis not present

## 2016-03-14 DIAGNOSIS — I252 Old myocardial infarction: Secondary | ICD-10-CM | POA: Diagnosis not present

## 2016-03-14 DIAGNOSIS — I25708 Atherosclerosis of coronary artery bypass graft(s), unspecified, with other forms of angina pectoris: Secondary | ICD-10-CM | POA: Diagnosis not present

## 2016-03-14 DIAGNOSIS — J441 Chronic obstructive pulmonary disease with (acute) exacerbation: Secondary | ICD-10-CM

## 2016-03-14 DIAGNOSIS — I214 Non-ST elevation (NSTEMI) myocardial infarction: Secondary | ICD-10-CM | POA: Diagnosis not present

## 2016-03-14 LAB — CBC
HCT: 39.6 % (ref 39.0–52.0)
Hemoglobin: 13.4 g/dL (ref 13.0–17.0)
MCH: 32.2 pg (ref 26.0–34.0)
MCHC: 33.8 g/dL (ref 30.0–36.0)
MCV: 95.2 fL (ref 78.0–100.0)
Platelets: 157 10*3/uL (ref 150–400)
RBC: 4.16 MIL/uL — ABNORMAL LOW (ref 4.22–5.81)
RDW: 15.1 % (ref 11.5–15.5)
WBC: 8.5 10*3/uL (ref 4.0–10.5)

## 2016-03-14 LAB — HEPARIN LEVEL (UNFRACTIONATED): Heparin Unfractionated: <0.1 IU/mL — ABNORMAL LOW (ref 0.30–0.70)

## 2016-03-14 MED ORDER — HYDRALAZINE HCL 50 MG PO TABS
75.0000 mg | ORAL_TABLET | Freq: Three times a day (TID) | ORAL | Status: DC
  Administered 2016-03-14 – 2016-03-15 (×3): 75 mg via ORAL
  Filled 2016-03-14 (×3): qty 1

## 2016-03-14 MED ORDER — ISOSORBIDE MONONITRATE ER 60 MG PO TB24
90.0000 mg | ORAL_TABLET | Freq: Every day | ORAL | Status: DC
  Administered 2016-03-15: 90 mg via ORAL
  Filled 2016-03-14: qty 1

## 2016-03-14 MED ORDER — HYDRALAZINE HCL 50 MG PO TABS
50.0000 mg | ORAL_TABLET | Freq: Once | ORAL | Status: AC
  Administered 2016-03-14: 50 mg via ORAL
  Filled 2016-03-14: qty 1

## 2016-03-14 NOTE — Progress Notes (Signed)
Upon arriving to pt room, pt in distress with eyes closed, stating he is having pain, pointing to left side/left chest.  States it began around 10am.  Reinforced importance of calling for help.  3 SL Ntg given to patient, 2L Oxygen applied.  Wynema BirchHao, [place] notified.  CP began at 9/10, down to 6/10 after third ntg.  EKG obtained.  Will continue to monitor closely.

## 2016-03-14 NOTE — Progress Notes (Signed)
Patient Name: [name] Date of Encounter: 03/14/2016  Primary Cardiologist: Dr. Delton See  Pt. Profile:  [name] is a cachectic 82 year old African-American male with past medical history of HTN, HLD, AAA/abdominal bruits, hypothyroidism, orthostatic hypotension, PAD, COPD, SSS s/p Medtronic PPM (placed by Dr. Jenne Campus 2003 with generator change 08/2010), CVA and CAD s/p 4v CABG in 2000 (LIMA to LAD, SVG to diag, SVG to OM4, SVG to PDA) presented with chest pain and SOB that woke him up from sleep this morning at 4:30AM. BNP 3000. SBP 170-190s in ED.   Principal Problem:   NSTEMI (non-ST elevated myocardial infarction) (HCC) Active Problems:   Hyperlipidemia   Tobacco abuse   History of CVA (cerebrovascular accident)   CAD (coronary artery disease) of artery bypass graft   Sick sinus syndrome (HCC) s/p Medtronic PPM   Chronic combined systolic and diastolic CHF (congestive heart failure) (HCC)   COPD with acute exacerbation (HCC)   PAD (peripheral artery disease) (HCC)   Chest pain   Hypertensive urgency    SUBJECTIVE  No further chest pain. Could not sleep last night.   CURRENT MEDS . amLODipine  10 mg Oral Daily  . aspirin EC  81 mg Oral Daily  . atorvastatin  40 mg Oral q1800  . clopidogrel  75 mg Oral QHS  . furosemide  20 mg Oral Daily  . heparin subcutaneous  5,000 Units Subcutaneous 3 times per day  . hydrALAZINE  50 mg Oral Once  . hydrALAZINE  75 mg Oral 3 times per day  . [START ON 03/15/2016] isosorbide mononitrate  90 mg Oral Daily  . metoprolol succinate  100 mg Oral Daily  . potassium chloride  10 mEq Oral Daily  . sodium chloride flush  3 mL Intravenous Q12H  . tamsulosin  0.4 mg Oral QPC supper    OBJECTIVE  Filed Vitals:   03/13/16 2020 03/14/16 0025 03/14/16 0434 03/14/16 0910  BP: 146/100 181/102 195/81 189/89  Pulse: 77 83 76 83  Temp: 97.8 F (36.6 C) 98.3 F (36.8 C) 98.3 F (36.8 C)   TempSrc: Oral Oral Oral   Resp: 18 18 18    Height:       Weight:   136 lb 9.6 oz (61.961 kg)   SpO2: 92% 93% 90%     Intake/Output Summary (Last 24 hours) at 03/14/16 0934 Last data filed at 03/14/16 0900  Gross per 24 hour  Intake    600 ml  Output    800 ml  Net   -200 ml   Filed Weights   03/12/16 0525 03/13/16 0500 03/14/16 0434  Weight: 137 lb 2 oz (62.2 kg) 139 lb 1.8 oz (63.1 kg) 136 lb 9.6 oz (61.961 kg)    PHYSICAL EXAM  General: Pleasant, NAD. Neuro: Alert and oriented X 3. Moves all extremities spontaneously. Psych: Normal affect. HEENT:  Normal  Neck: Supple without bruits or JVD. Lungs:  Resp regular and unlabored, CTA. Heart: RRR no s3, s4, or murmurs. Abdomen: Soft, non-tender, non-distended, BS + x 4.  Extremities: No clubbing, cyanosis or edema. DP/PT/Radials 2+ and equal bilaterally.  Accessory Clinical Findings  CBC  Recent Labs  03/13/16 0543 03/14/16 0527  WBC 7.6 8.5  HGB 12.9* 13.4  HCT 39.9 39.6  MCV 96.4 95.2  PLT 151 157   Basic Metabolic Panel  Recent Labs  03/12/16 0415 03/12/16 1240  NA 143 142  K 3.2* 3.6  CL 107 107  CO2 26 27  GLUCOSE 93  85  BUN 19 18  CREATININE 1.92* 1.79*  CALCIUM 7.9* 7.8*   Cardiac Enzymes  Recent Labs  03/11/16 2108 03/11/16 2314 03/12/16 0415  TROPONINI 0.47* 0.46* 0.44*    TELE Paced rhythm    ECG  No new EKG  Echocardiogram 03/13/2016  LV EF: 35% - 40%  ------------------------------------------------------------------- Indications: Chest pain 786.51.  ------------------------------------------------------------------- History: PMH: Abdominal Aortic Aneurysm. Hypotension. Coronary artery disease. Transient ischemic attack. Chronic obstructive pulmonary disease. PMH: Sick-Sinus Syndrome. Risk factors: Hypertension. Dyslipidemia.  ------------------------------------------------------------------- Study Conclusions  - Left ventricle: The cavity size was normal. There was mild focal  basal hypertrophy of  the septum. Systolic function was moderately  reduced. The estimated ejection fraction was in the range of 35%  to 40%. There is severe hypokinesis of the basal-midlateral  myocardium. There is akinesis of the basal-midinferior  myocardium. There is akinesis of the entireinferolateral  myocardium. Doppler parameters are consistent with high  ventricular filling pressure. - Aortic valve: Severe diffuse thickening and calcification  involving the right coronary and noncoronary cusp. There was  moderate regurgitation. - Mitral valve: There was mild to moderate regurgitation. - Right ventricle: Pacer wire or catheter noted in right ventricle. - Right atrium: The atrium was moderately dilated. - Tricuspid valve: There was mild-moderate regurgitation. - Pulmonic valve: There was trivial regurgitation. - Pulmonary arteries: PA peak pressure: 66 mm Hg (S).  Impressions:  - The right ventricular systolic pressure was increased consistent  with moderate pulmonary hypertension.    Radiology/Studies  Dg Chest Portable 1 View  03/11/2016  CLINICAL DATA:  Acute left-sided chest pain. EXAM: PORTABLE CHEST 1 VIEW COMPARISON:  May 13, 2015. FINDINGS: Stable cardiomegaly. Status post Coronary artery bypass graft. Left-sided pacemaker is unchanged in position. No pneumothorax is noted. Descending thoracic aortic enlargement is again noted consistent with aneurysm. Increased perihilar and bibasilar interstitial densities are noted concerning for pulmonary edema. No significant pleural effusion is noted. IMPRESSION: Continued presence of descending thoracic aortic aneurysm as noted on prior exams. Stable cardiomegaly. Increased perihilar and bibasilar interstitial densities are noted concerning for pulmonary edema. Electronically Signed   By: Lupita Raider, M.D.   On: 03/11/2016 08:38   Ct Angio Chest Aorta W/cm &/or Wo/cm  03/11/2016  CLINICAL DATA:  Sudden onset of chest pain radiating to back,  known thoracic aneurysm, hypertension, coronary artery disease, stroke, COPD, hyperlipidemia, T8, CHF, peripheral vascular disease, former smoker EXAM: CT ANGIOGRAPHY CHEST WITH CONTRAST TECHNIQUE: Multidetector CT imaging of the chest was performed using the standard protocol during bolus administration of intravenous contrast. Multiplanar CT image reconstructions and MIPs were obtained to evaluate the vascular anatomy. CONTRAST:  80 cc Isovue 370 IV COMPARISON:  05/13/2015 FINDINGS: No definite intramural hematoma on precontrast imaging. Scattered atherosclerotic calcifications aorta and coronary arteries. Pacemaker leads RIGHT atrium and RIGHT ventricle. Prior median sternotomy and CABG. Aneurysmal dilatation descending thoracic aorta on axial images measuring up to: 4.5 cm image 63 4.7 cm image 85 5.6 cm image 109 5.6 cm image 124 at aortic hiatus. At proximal descending thoracic aorta, inhomogeneous attenuation of contrast enhancement of aortic lumen. A discrete intimal flap is not demonstrated on axial or coronal images. Sagittal images show questionable area of thin linear lucency but inconsistent and incomplete. A definite aortic dissection is not identified ; potentially the inhomogeneous enhancement could be related to an an admixture of opacified and non-opacified blood on the basis of poor cardiac output ; does patient have a significantly decreased LV ejection fraction? Pulmonary arteries  grossly patent on non targeted exam. Stomach incompletely distended unable to exclude gastric wall thickening at mid stomach. Cystic lesion RIGHT lobe liver medially 3.0 x 2.2 cm image 134. Remaining visualized upper abdomen unremarkable. Enlarged RIGHT paratracheal lymph node at [place] 2.4 cm short axis image 53. Additional mildly prominent precarinal node 12 mm short axis image 53. Question enlarged sub carinal node 16 mm short axis image 62. BILATERAL pleural effusions with compressive atelectasis of the posterior  lungs. No pleural effusion or pneumothorax. Osseous structures unremarkable. Review of the MIP images confirms the above findings. IMPRESSION: Aneurysmal dilatation of descending thoracic aorta up to 5.6 cm diameter at aortic hiatus, 5.2 cm previously. No definite aortic dissection is identified though an area of inhomogeneous aortic luminal enhancement is seen at the proximal descending thoracic aorta, question related to an admixture of opacified non-opacified blood on the basis of poor cardiac output. If patient has persistent pain, may consider followup CT imaging with contrast to reassess aorta. BILATERAL low-attenuation pleural effusions and compressive atelectasis of the posterior lungs. Nonspecific mediastinal adenopathy. Scattered atherosclerotic disease. Electronically Signed   By: Ulyses SouthwardMark  Boles M.D.   On: 03/11/2016 13:49    ASSESSMENT AND PLAN  1. Chest pain at rest radiating to the back:   - occurred in the setting of hypertensive urgency, unclear if trop elevated related to malignant HTN vs underlying CAD  - CTA of chest negative for dissection, but did mention enlarging descending thoracic aortic aneurysm which i have discussed with patient and family that he will need to discuss with his surgeon - severely elevated BNP of 3000, he does not appears to be significantly fluid overloaded on physical exam  - Originally planned for cath, however renal function worsened, serial came back flat, Dr. Delton SeeNelson reviewed his record and felt CP maybe related to uncontrolled BP.  - very difficult to control BP as he continue to be hypertensive in 190s, hydralazine increased to 75mg  TID this morning. Also increased Imdur to 90mg . May need outpatient renal artery U/S given difficult to control BP, he did have a CTA of abdomen and pelvis last month, however it did not mention any renal artery stenosis.   - Echo 03/13/2016 EF 35-40%, severe hypokinesis and basal-midlateral myocardium, akinesis of  basal-midinferior and entire inferolateral myocardium, moderate AR, mild to moderate MR, mild to moderate TR, PA peak pressure 66mmHg. Compare to previous echo, his EF did drop slightly from 45%. He wish to go home today, will try to be more aggressive in controlling his BP.  2. Hypertensive urgency: Which also likely contributed to the elevated troponin and BNP. We'll need to adjust his medication better, target goal systolic blood pressure 130 to 150s.   - BP still very high, will increase Imdur to 90mg  daily, increase hydralazine to 75mg  TID. Restart home dose of lasix.   3. CAD s/p 4v CABG in 2000 (LIMA to LAD, SVG to diag, SVG to OM4, SVG to PDA)  4. HLD  5. Descending thoracic aortic aneurysm/abdominal bruits: CT of abdomen on 01/28/2016 showed descending thoracic aortic aneurysm maximal dimension 4.3 cm, increased from 3 secondary in 2013, recommended one-year ultrasound. CTA of chest obtained on 4/6 however indicate aneurysmal dilatation of 5.6 cm  6. Hypothyroidism  7. SSS s/p Medtronic PPM (placed by Dr. [name] 2003 with generator change 08/2010)  8. H/o CVA   Signed, Amedeo PlentyMeng, Hao PA-C Pager: [phone]  The patient was seen and examined, and I agree with the physical exam, assessment and plan  as documented above which has been discussed with H. Meng PA-C, with modifications as noted below. Pt denies chest pain and is eager to go home. BP remains elevated. Given descending thoracic aortic aneurysm, will aim to more optimally control BP with increase in Imdur dose and hydralazine dose (initiated yesterday).  Will plan to discharge to home today.  Prentice Docker MD, [facility] ([facility]) 03/14/16 10:11 AM

## 2016-03-15 DIAGNOSIS — I712 Thoracic aortic aneurysm, without rupture, unspecified: Secondary | ICD-10-CM

## 2016-03-15 DIAGNOSIS — R7989 Other specified abnormal findings of blood chemistry: Secondary | ICD-10-CM | POA: Diagnosis not present

## 2016-03-15 DIAGNOSIS — I161 Hypertensive emergency: Secondary | ICD-10-CM | POA: Diagnosis not present

## 2016-03-15 DIAGNOSIS — I739 Peripheral vascular disease, unspecified: Secondary | ICD-10-CM | POA: Diagnosis not present

## 2016-03-15 DIAGNOSIS — I25708 Atherosclerosis of coronary artery bypass graft(s), unspecified, with other forms of angina pectoris: Secondary | ICD-10-CM | POA: Diagnosis not present

## 2016-03-15 LAB — CBC
HCT: 39 % (ref 39.0–52.0)
Hemoglobin: 13 g/dL (ref 13.0–17.0)
MCH: 31.3 pg (ref 26.0–34.0)
MCHC: 33.3 g/dL (ref 30.0–36.0)
MCV: 93.8 fL (ref 78.0–100.0)
Platelets: 173 10*3/uL (ref 150–400)
RBC: 4.16 MIL/uL — ABNORMAL LOW (ref 4.22–5.81)
RDW: 15.1 % (ref 11.5–15.5)
WBC: 7.6 10*3/uL (ref 4.0–10.5)

## 2016-03-15 LAB — BASIC METABOLIC PANEL
Anion gap: 12 (ref 5–15)
BUN: 17 mg/dL (ref 6–20)
CHLORIDE: 109 mmol/L (ref 101–111)
CO2: 22 mmol/L (ref 22–32)
Calcium: 8.6 mg/dL — ABNORMAL LOW (ref 8.9–10.3)
Creatinine, Ser: 1.38 mg/dL — ABNORMAL HIGH (ref 0.61–1.24)
GFR calc Af Amer: 51 mL/min — ABNORMAL LOW (ref >60)
GFR calc non Af Amer: 44 mL/min — ABNORMAL LOW (ref >60)
GLUCOSE: 98 mg/dL (ref 65–99)
POTASSIUM: 3.5 mmol/L (ref 3.5–5.1)
SODIUM: 143 mmol/L (ref 135–145)

## 2016-03-15 LAB — HEPARIN LEVEL (UNFRACTIONATED): Heparin Unfractionated: <0.1 IU/mL — ABNORMAL LOW (ref 0.30–0.70)

## 2016-03-15 MED ORDER — AMLODIPINE BESYLATE 10 MG PO TABS: 10.0000 mg | ORAL_TABLET | Freq: Every day | ORAL | Status: DC

## 2016-03-15 MED ORDER — HYDRALAZINE HCL 50 MG PO TABS
100.0000 mg | ORAL_TABLET | Freq: Three times a day (TID) | ORAL | Status: DC
  Administered 2016-03-15: 100 mg via ORAL
  Filled 2016-03-15: qty 2

## 2016-03-15 MED ORDER — HYDRALAZINE HCL 100 MG PO TABS: 100.0000 mg | ORAL_TABLET | Freq: Three times a day (TID) | ORAL | Status: DC

## 2016-03-15 MED ORDER — ISOSORBIDE MONONITRATE ER 60 MG PO TB24: 90.0000 mg | ORAL_TABLET | Freq: Every day | ORAL | Status: DC

## 2016-03-15 NOTE — Evaluation (Signed)
Physical Therapy Evaluation Patient Details Name: [name] MRN: [id number] DOB: [birth year] Today's Date: 03/15/2016   History of Present Illness  pt presents with Chest pain and HTN.  pt with hx of NSTEMI, HTN, AAA, Hypothyroidism, PAD, COPD, Pacemaker, CVA, and CABG.    Clinical Impression  Pt generally unsteady and seems to try to cover for it by wrapping his arm around PT talking about giving hugs, though pt is clearly leaning on PT for support at times.  Pt oriented, but tangental in conversation and at times difficult to understand what pt is saying.  Feel pt is safe to D/C to home with family support.      Follow Up Recommendations Home health PT;Supervision for mobility/OOB    Equipment Recommendations  Rolling walker with 5" wheels    Recommendations for Other Services       Precautions / Restrictions Precautions Precautions: Fall Restrictions Weight Bearing Restrictions: No      Mobility  Bed Mobility Overal bed mobility: Needs Assistance Bed Mobility: Supine to Sit;Sit to Supine     Supine to sit: Min assist Sit to supine: Supervision   General bed mobility comments: pt needs A for bringing trunk up to sitting, but able to return to bed without A.    Transfers Overall transfer level: Needs assistance Equipment used: None Transfers: Sit to/from Stand Sit to Stand: Min guard         General transfer comment: pt mildly unsteady and with definite use of UEs.    Ambulation/Gait Ambulation/Gait assistance: Min assist Ambulation Distance (Feet): 200 Feet Assistive device: None Gait Pattern/deviations: Step-through pattern;Decreased stride length;Narrow base of support     General Gait Details: pt occasionally puts his arm around PT, somewhat joking, but also using PT for support.  pt does have occasional stumble steps and side steps due to balance deficits.    Stairs            Wheelchair Mobility    Modified Rankin (Stroke Patients Only)        Balance Overall balance assessment: Needs assistance Sitting-balance support: No upper extremity supported;Feet supported Sitting balance-Leahy Scale: Good     Standing balance support: No upper extremity supported;During functional activity Standing balance-Leahy Scale: Fair                               Pertinent Vitals/Pain Pain Assessment: No/denies pain    Home Living Family/patient expects to be discharged to:: Private residence Living Arrangements: Children (Daughter) Available Help at Discharge: Family (Unsure.  RN states daughter works, chart indicates 24hr A.) Type of Home: House Home Access: Stairs to enter Entrance Stairs-Rails: Left Entrance Stairs-Number of Steps: 2 Home Layout: One level Home Equipment: Walker - 2 wheels;Wheelchair - manual;Bedside commode;Cane - single point Additional Comments: pt difficult to understand, so some information read in chart review.      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     Hand Dominance   Dominant Hand: Right    Extremity/Trunk Assessment   Upper Extremity Assessment: Generalized weakness           Lower Extremity Assessment: Generalized weakness      Cervical / Trunk Assessment: Kyphotic  Communication   Communication:  (Difficult to understand at times.  )  Cognition Arousal/Alertness: Awake/alert Behavior During Therapy: WFL for tasks assessed/performed Overall Cognitive Status:  No family/caregiver present to determine baseline cognitive functioning                      General Comments      Exercises        Assessment/Plan    PT Assessment Patient needs continued PT services  PT Diagnosis Difficulty walking;Generalized weakness   PT Problem List Decreased strength;Decreased activity tolerance;Decreased balance;Decreased mobility;Decreased cognition;Decreased knowledge of use of DME  PT  Treatment Interventions DME instruction;Gait training;Stair training;Functional mobility training;Therapeutic activities;Therapeutic exercise;Balance training;Patient/family education   PT Goals (Current goals can be found in the Care Plan section) Acute Rehab PT Goals Patient Stated Goal: Home ASAP. PT Goal Formulation: With patient Time For Goal Achievement: 03/29/16 Potential to Achieve Goals: Good    Frequency Min 3X/week   Barriers to discharge        Co-evaluation               End of Session Equipment Utilized During Treatment: Gait belt Activity Tolerance: Patient tolerated treatment well Patient left: in bed;with call bell/phone within reach;with bed alarm set Nurse Communication: Mobility status    Functional Assessment Tool Used: Clinical Judgement Functional Limitation: Mobility: Walking and moving around Mobility: Walking and Moving Around Current Status ([id number]): At least 1 percent but less than 20 percent impaired, limited or restricted Mobility: Walking and Moving Around Goal Status [phone]): 0 percent impaired, limited or restricted    Time: 1312-1340 PT Time Calculation (min) (ACUTE ONLY): 28 min   Charges:   PT Evaluation $PT Eval Moderate Complexity: 1 Procedure PT Treatments $Gait Training: 8-22 mins   PT G Codes:   PT G-Codes **NOT FOR INPATIENT CLASS** Functional Assessment Tool Used: Clinical Judgement Functional Limitation: Mobility: Walking and moving around Mobility: Walking and Moving Around Current Status ([id number]): At least 1 percent but less than 20 percent impaired, limited or restricted Mobility: Walking and Moving Around Goal Status [phone]): 0 percent impaired, limited or restricted    [name], [place] [phone] 03/15/2016, 1:56 PM

## 2016-03-15 NOTE — Discharge Instructions (Signed)

## 2016-03-15 NOTE — Discharge Summary (Signed)
Discharge Summary    Patient ID: [name],  MRN: [id number], DOB/AGE: Sep 02, 1927 82 y.o.  Admit date: 03/11/2016 Discharge date: 03/15/2016  Primary Care Provider: BURNETT,BRENT A Primary Cardiologist[facility]  Discharge Diagnoses    Principal Problem:   NSTEMI (non-ST elevated myocardial infarction) [facility]) Active Problems:   Hyperlipidemia   Tobacco abuse   History of CVA (cerebrovascular accident)   CAD (coronary artery disease) of artery bypass graft   Sick sinus syndrome (HCC) s/p Medtronic PPM   Chronic combined systolic and diastolic CHF (congestive heart failure) (HCC)   COPD with acute exacerbation (HCC)   PAD (peripheral artery disease) (HCC)   Chest pain   Hypertensive emergency   Thoracic aortic aneurysm without rupture (HCC)   Allergies Allergies  Allergen Reactions  . Pravachol Other (See Comments)    myalgias  . Simvastatin Other (See Comments)    myalgia  . Other Other (See Comments)    GREEN BEANS AND ONIONS  . Oxycodone Other (See Comments)    Family member reports "he was highly agitated and disoriented"   . Terazosin Other (See Comments)    unknown    Diagnostic Studies/Procedures    CTA of chest 03/11/2016 IMPRESSION: Aneurysmal dilatation of descending thoracic aorta up to 5.6 cm diameter at aortic hiatus, 5.2 cm previously.  No definite aortic dissection is identified though an area of inhomogeneous aortic luminal enhancement is seen at the proximal descending thoracic aorta, question related to an admixture of opacified non-opacified blood on the basis of poor cardiac output.  If patient has persistent pain, may consider followup CT imaging with contrast to reassess aorta.  BILATERAL low-attenuation pleural effusions and compressive atelectasis of the posterior lungs.  Nonspecific mediastinal adenopathy.  Scattered atherosclerotic disease.     Echo 03/13/2016 LV EF: 35% -  40%  ------------------------------------------------------------------- Indications: Chest pain 786.51.  ------------------------------------------------------------------- History: PMH: Abdominal Aortic Aneurysm. Hypotension. Coronary artery disease. Transient ischemic attack. Chronic obstructive pulmonary disease. PMH: Sick-Sinus Syndrome. Risk factors: Hypertension. Dyslipidemia.  ------------------------------------------------------------------- Study Conclusions  - Left ventricle: The cavity size was normal. There was mild focal  basal hypertrophy of the septum. Systolic function was moderately  reduced. The estimated ejection fraction was in the range of 35%  to 40%. There is severe hypokinesis of the basal-midlateral  myocardium. There is akinesis of the basal-midinferior  myocardium. There is akinesis of the entireinferolateral  myocardium. Doppler parameters are consistent with high  ventricular filling pressure. - Aortic valve: Severe diffuse thickening and calcification  involving the right coronary and noncoronary cusp. There was  moderate regurgitation. - Mitral valve: There was mild to moderate regurgitation. - Right ventricle: Pacer wire or catheter noted in right ventricle. - Right atrium: The atrium was moderately dilated. - Tricuspid valve: There was mild-moderate regurgitation. - Pulmonic valve: There was trivial regurgitation. - Pulmonary arteries: PA peak pressure: 66 mm Hg (S).  Impressions:  - The right ventricular systolic pressure was increased consistent  with moderate pulmonary hypertension.   _____________   History of Present Illness     [name] is a cachectic 82 year old African-American male with past medical history of HTN, HLD, AAA/abdominal bruits, hypothyroidism, orthostatic hypotension, PAD, COPD, SSS s/p Medtronic PPM (placed by Dr. Jenne Campus 2003 with generator change 08/2010), CVA and CAD s/p 4v CABG in  2000 (LIMA to LAD, SVG to diag, SVG to OM4, SVG to PDA). He underwent cardiac catheterization on 01/17/2014 showed severe disease in the body of SVG to OM s/p  successful PTCA/bare metal stent, chronically occluded RCA, aneurysmal SVG to diagonal, patent LIMA to LAD. He denies ever having any chest pain before, his anginal symptom has been shortness of breath. He was admitted in December 2015 with numbness of the right arm and leg, CT of the head at the time showed a small intraparenchymal hemorrhage involving the left centrum semiovale. His Plavix was held for a short period time and was later restarted. Echocardiogram obtained on 03/19/2014 showed EF 45%, diffuse hypokinesis. I saw him back in the ED with atypical chest pain in June 2016. At that time his d-dimer was greater than 20. Venous Doppler was negative for DVT. CTA of the chest was negative for PE. CT scan of the abdomen showed 5.1 cm AAA, outpatient surgical consultation was recommended. His serial troponin was mildly elevated at the time however flat, this was felt not to represent ACS. He was eventually discharged for outpatient stress test, outpatient stress test performed on 05/22/2015 showed EF 32% with inferior and apical hypokinesis, small area of inferolateral wall infarct at the base and a small apical infarct, however no ischemia.   Since the last time I saw him, he has underwent lower extremity evaluation by [name] of vascular surgery and had right external iliac and common femoral and profundus endarterectomy. He was most recently admitted in February 2017 for abdominal pain, he was diagnosed with likely left-sided pyelonephritis which was seen on CT angiogram of abdomen and pelvis. His Lasix was temporarily held due to worsening renal function. CT of abdomen and pelvis also showed persistent abdominal aortic aneurysm, maximal dimension 4.2 cm, increased from 3 cm in 2013. It was recommended to follow-up with abdominal ultrasound in one  year.  Patient was essentially his usual state of health until woke up around 4:30 AM in the morning of 03/11/2016 with chest pain radiating to the back. He eventually contacted his family member around 6:30 gave him 4 baby aspirin, his chest pain went away after that. He also admits to have some shortness of breath and cough as well. He has chronic right lower extremity edema secondary to vein harvesting. Initial chest x-ray showed increased perihilar interstitial density concerning for pulmonary edema. Significant laboratory finding include a troponin of 0.37. BNP of 3000. Significant laboratory finding also include potassium 3.4, creatinine 1.5 which appears to be his baseline. EKG was difficult to interpret due to PVCs and motion artifact, however does not appear to have significant ST changes. Cardiology has been consulted for chest pain.  Hospital Course     Consultants: Vascular surgery, Dr. Darrick Penna   Given his chest pain and elevated troponin, original plan was to undergo diagnostic cardiac catheterization after controlling his blood pressure. Unfortunately on the day of cath, his renal function went down. He did receive a dose of Lasix in the ED for suspected fluid overload on chest x-ray, although physical exam does not support sign of fluid overload. Eventually, her case was reviewed by Dr. Delton See his primary cardiologist, it was felt that his chest pain is likely related to hypertensive emergency, and our treatment should focus on controlling his blood pressure. We did obtain a CTA of the chest which ruled out aortic dissection, CTA did show enlarging descending thoracic aortic aneurysm compared to the previous image.  We increased his Imdur to his 90 mg daily, hydralazine was started at  3 times a day and was eventually increased to 100 mg 3 times a day. Amlodipine was increased to 10 mg daily.  Echocardiogram obtained on 4/80/2017 showed EF 35-40%, severe hypokinesis of basal mid  inferolateral myocardium, akinesis of basal mid inferior myocardium, akinesis of the entire anterolateral myocardium, moderate AR, mild-to-moderate MR, moderate TR, PA peak pressure 66 mmHg. His blood pressure was eventually able to be controlled on 03/15/2016. We obtained a vascular surgery consult prior to discharge, per Dr. Jettie Booze, elective repair usually is considered at 6 cm, he has not reached that degree. However given his comorbidities, even if it grows to this size, vascular surgery still would not consider him fit for repair surgery as he will likely not tolerate this. Otherwise for the goal of controlling thoracic aortic aneurysm, we will need to control his blood pressure well to avoid any further dilatation in size. As far as the abdominal aortic aneurysm, the left common iliac is at the size at least consideration for repair should be discussed. Patient will follow-up with Dr. [name] for this.    _____________  Discharge Vitals Blood pressure 117/72, pulse 77, temperature 98 F (36.7 C), temperature source Oral, resp. rate 18, height 5\' 9"  (1.753 m), weight 136 lb 9.6 oz (61.961 kg), SpO2 96 %.  Filed Weights   03/12/16 0525 03/13/16 0500 03/14/16 0434  Weight: 137 lb 2 oz (62.2 kg) 139 lb 1.8 oz (63.1 kg) 136 lb 9.6 oz (61.961 kg)    Labs & Radiologic Studies     CBC  Recent Labs  03/14/16 0527 03/15/16 0345  WBC 8.5 7.6  HGB 13.4 13.0  HCT 39.6 39.0  MCV 95.2 93.8  PLT 157 173   Basic Metabolic Panel  Recent Labs  03/15/16 0345  NA 143  K 3.5  CL 109  CO2 22  GLUCOSE 98  BUN 17  CREATININE 1.38*  CALCIUM 8.6*    Dg Chest Portable 1 View  03/11/2016  CLINICAL DATA:  Acute left-sided chest pain. EXAM: PORTABLE CHEST 1 VIEW COMPARISON:  May 13, 2015. FINDINGS: Stable cardiomegaly. Status post Coronary artery bypass graft. Left-sided pacemaker is unchanged in position. No pneumothorax is noted. Descending thoracic aortic enlargement is again noted consistent with  aneurysm. Increased perihilar and bibasilar interstitial densities are noted concerning for pulmonary edema. No significant pleural effusion is noted. IMPRESSION: Continued presence of descending thoracic aortic aneurysm as noted on prior exams. Stable cardiomegaly. Increased perihilar and bibasilar interstitial densities are noted concerning for pulmonary edema. Electronically Signed   By: Lupita Raider, M.D.   On: 03/11/2016 08:38   Ct Angio Chest Aorta W/cm &/or Wo/cm  03/11/2016  CLINICAL DATA:  Sudden onset of chest pain radiating to back, known thoracic aneurysm, hypertension, coronary artery disease, stroke, COPD, hyperlipidemia, T8, CHF, peripheral vascular disease, former smoker EXAM: CT ANGIOGRAPHY CHEST WITH CONTRAST TECHNIQUE: Multidetector CT imaging of the chest was performed using the standard protocol during bolus administration of intravenous contrast. Multiplanar CT image reconstructions and MIPs were obtained to evaluate the vascular anatomy. CONTRAST:  80 cc Isovue 370 IV COMPARISON:  05/13/2015 FINDINGS: No definite intramural hematoma on precontrast imaging. Scattered atherosclerotic calcifications aorta and coronary arteries. Pacemaker leads RIGHT atrium and RIGHT ventricle. Prior median sternotomy and CABG. Aneurysmal dilatation descending thoracic aorta on axial images measuring up to: 4.5 cm image 63 4.7 cm image 85 5.6 cm image 109 5.6 cm image 124 at aortic hiatus. At proximal descending thoracic aorta, inhomogeneous attenuation of contrast enhancement of aortic lumen. A discrete intimal flap is not demonstrated on axial or coronal images. Sagittal images show questionable area of thin linear  lucency but inconsistent and incomplete. A definite aortic dissection is not identified ; potentially the inhomogeneous enhancement could be related to an an admixture of opacified and non-opacified blood on the basis of poor cardiac output ; does patient have a significantly decreased LV  ejection fraction? Pulmonary arteries grossly patent on non targeted exam. Stomach incompletely distended unable to exclude gastric wall thickening at mid stomach. Cystic lesion RIGHT lobe liver medially 3.0 x 2.2 cm image 134. Remaining visualized upper abdomen unremarkable. Enlarged RIGHT paratracheal lymph node at carina 2.4 cm short axis image 53. Additional mildly prominent precarinal node 12 mm short axis image 53. Question enlarged sub carinal node 16 mm short axis image 62. BILATERAL pleural effusions with compressive atelectasis of the posterior lungs. No pleural effusion or pneumothorax. Osseous structures unremarkable. Review of the MIP images confirms the above findings. IMPRESSION: Aneurysmal dilatation of descending thoracic aorta up to 5.6 cm diameter at aortic hiatus, 5.2 cm previously. No definite aortic dissection is identified though an area of inhomogeneous aortic luminal enhancement is seen at the proximal descending thoracic aorta, question related to an admixture of opacified non-opacified blood on the basis of poor cardiac output. If patient has persistent pain, may consider followup CT imaging with contrast to reassess aorta. BILATERAL low-attenuation pleural effusions and compressive atelectasis of the posterior lungs. Nonspecific mediastinal adenopathy. Scattered atherosclerotic disease. Electronically Signed   By: Ulyses SouthwardMark  Boles M.D.   On: 03/11/2016 13:49    Disposition   Pt is being discharged home today in good condition.  Follow-up Plans & Appointments    Follow-up Information    Follow up with [facility].   Why:  Home Health RN, Physical Therapy and aide   Contact information:   [address] [place] [address] [phone]       Follow up with Lars MassonNELSON, KATARINA H, MD On 04/26/2016.   Specialty:  Cardiology   Why:  11:00AM. Office will try to arrange a closer followup with either Dr. Delton SeeNelson or her PA/NP in 1-2 weeks.    Contact  information:   [address] [place] [address] [phone]       Schedule an appointment as soon as possible for a visit with Gretta BeganEarly, Todd, MD.   Specialties:  Vascular Surgery, Cardiology   Contact information:   [address] [place] [address] [phone]      Discharge Instructions    Diet - low sodium heart healthy    Complete by:  As directed      Increase activity slowly    Complete by:  As directed            Discharge Medications   Current Discharge Medication List    START taking these medications   Details  hydrALAZINE (APRESOLINE) 100 MG tablet Take 1 tablet (100 mg total) by mouth every 8 (eight) hours. Qty: 90 tablet, Refills: 11      CONTINUE these medications which have CHANGED   Details  amLODipine (NORVASC) 10 MG tablet Take 1 tablet (10 mg total) by mouth daily. Qty: 90 tablet, Refills: 3    isosorbide mononitrate (IMDUR) 60 MG 24 hr tablet Take 1.5 tablets (90 mg total) by mouth daily. Qty: 45 tablet, Refills: 5      CONTINUE these medications which have NOT CHANGED   Details  acetaminophen (TYLENOL) 325 MG tablet Take 650 mg by mouth every 6 (six) hours as needed. Reported on 11/25/2015    aspirin 81  MG tablet Take 81 mg by mouth daily.    atorvastatin (LIPITOR) 40 MG tablet Take 1 tablet (40 mg total) by mouth daily at 6 PM. Qty: 30 tablet, Refills: 5    clopidogrel (PLAVIX) 75 MG tablet Take 1 tablet (75 mg total) by mouth at bedtime. Resume in 1 week.    furosemide (LASIX) 20 MG tablet Take 1 tablet (20 mg total) by mouth daily. Qty: 90 tablet, Refills: 3    metoprolol succinate (TOPROL-XL) 100 MG 24 hr tablet Take 100 mg by mouth daily. Take with or immediately following a meal.    potassium chloride (K-DUR) 10 MEQ tablet Take 1 tablet (10 mEq total) by mouth daily. Qty: 90 tablet, Refills: 3    Tamsulosin HCl (FLOMAX) 0.4 MG CAPS Take 0.4 mg by mouth daily after supper.     nitroGLYCERIN (NITROSTAT) 0.4 MG  SL tablet Place 1 tablet (0.4 mg total) under the tongue every 5 (five) minutes as needed. For chest pain Qty: 25 tablet, Refills: 3           Outstanding Labs/Studies   None  Duration of Discharge Encounter   Greater than 30 minutes including physician time.  Signed, Azalee Course PA-C 03/15/2016, 6:03 PM    I have examined the patient and reviewed assessment and plan and discussed with patient.  Agree with above as stated.  BP better controlled.  Aneurysm does not require intervention at this time.  He may not be a candidate for a repair regardless. No sx of ischemia even in the setting of elevated troponin.  Was likely related to elevated BP.  Continue medical therapy.  Sihaam Chrobak S.

## 2016-03-15 NOTE — Progress Notes (Signed)
Patient Name: [name] Date of Encounter: 03/15/2016  Primary Cardiologist: Dr. Delton SeeNelson  Pt. Profile:  [name] is a cachectic 82 year old African-American male with past medical history of HTN, HLD, AAA/abdominal bruits, hypothyroidism, orthostatic hypotension, PAD, COPD, SSS s/p Medtronic PPM (placed by Dr. [name] 2003 with generator change 08/2010), CVA and CAD s/p 4v CABG in 2000 (LIMA to LAD, SVG to diag, SVG to OM4, SVG to PDA) presented with chest pain and SOB that woke him up from sleep this morning at 4:30AM. BNP 3000. SBP 170-190s in ED.   Principal Problem:   NSTEMI (non-ST elevated myocardial infarction) (HCC) Active Problems:   Hyperlipidemia   Tobacco abuse   History of CVA (cerebrovascular accident)   CAD (coronary artery disease) of artery bypass graft   Sick sinus syndrome (HCC) s/p Medtronic PPM   Chronic combined systolic and diastolic CHF (congestive heart failure) (HCC)   COPD with acute exacerbation (HCC)   PAD (peripheral artery disease) (HCC)   Chest pain   Hypertensive urgency    SUBJECTIVE  Denies any discomfort, ready to go home  CURRENT MEDS . amLODipine  10 mg Oral Daily  . aspirin EC  81 mg Oral Daily  . atorvastatin  40 mg Oral q1800  . clopidogrel  75 mg Oral QHS  . furosemide  20 mg Oral Daily  . heparin subcutaneous  5,000 Units Subcutaneous 3 times per day  . hydrALAZINE  75 mg Oral 3 times per day  . isosorbide mononitrate  90 mg Oral Daily  . metoprolol succinate  100 mg Oral Daily  . potassium chloride  10 mEq Oral Daily  . sodium chloride flush  3 mL Intravenous Q12H  . tamsulosin  0.4 mg Oral QPC supper    OBJECTIVE  Filed Vitals:   03/14/16 1605 03/14/16 1828 03/14/16 2141 03/15/16 0500  BP: 170/78 137/77 161/74 150/61  Pulse: 76 78 77 85  Temp: 97.3 F (36.3 C)  97.5 F (36.4 C) 98.2 F (36.8 C)  TempSrc:   Oral Oral  Resp:      Height:      Weight:      SpO2: 96%  97% 93%    Intake/Output Summary (Last 24  hours) at 03/15/16 0817 Last data filed at 03/14/16 2200  Gross per 24 hour  Intake    480 ml  Output    350 ml  Net    130 ml   Filed Weights   03/12/16 0525 03/13/16 0500 03/14/16 0434  Weight: 137 lb 2 oz (62.2 kg) 139 lb 1.8 oz (63.1 kg) 136 lb 9.6 oz (61.961 kg)    PHYSICAL EXAM  General: Pleasant, NAD. Neuro: Alert and oriented X 3. Moves all extremities spontaneously. Psych: Normal affect. HEENT:  Normal  Neck: Supple without bruits or JVD. Lungs:  Resp regular and unlabored, CTA. Heart: RRR no s3, s4, or murmurs. Abdomen: Soft, non-tender, non-distended, BS + x 4.  Extremities: No clubbing, cyanosis or edema. DP/PT/Radials 2+ and equal bilaterally.  Accessory Clinical Findings  CBC  Recent Labs  03/14/16 0527 03/15/16 0345  WBC 8.5 7.6  HGB 13.4 13.0  HCT 39.6 39.0  MCV 95.2 93.8  PLT 157 173   Basic Metabolic Panel  Recent Labs  03/12/16 1240  NA 142  K 3.6  CL 107  CO2 27  GLUCOSE 85  BUN 18  CREATININE 1.79*  CALCIUM 7.8*    TELE Paced rhythm    ECG  No new EKG  Echocardiogram 03/13/2016  LV EF: 35% - 40%  ------------------------------------------------------------------- Indications: Chest pain 786.51.  ------------------------------------------------------------------- History: PMH: Abdominal Aortic Aneurysm. Hypotension. Coronary artery disease. Transient ischemic attack. Chronic obstructive pulmonary disease. PMH: Sick-Sinus Syndrome. Risk factors: Hypertension. Dyslipidemia.  ------------------------------------------------------------------- Study Conclusions  - Left ventricle: The cavity size was normal. There was mild focal  basal hypertrophy of the septum. Systolic function was moderately  reduced. The estimated ejection fraction was in the range of 35%  to 40%. There is severe hypokinesis of the basal-midlateral  myocardium. There is akinesis of the basal-midinferior  myocardium. There is  akinesis of the entireinferolateral  myocardium. Doppler parameters are consistent with high  ventricular filling pressure. - Aortic valve: Severe diffuse thickening and calcification  involving the right coronary and noncoronary cusp. There was  moderate regurgitation. - Mitral valve: There was mild to moderate regurgitation. - Right ventricle: Pacer wire or catheter noted in right ventricle. - Right atrium: The atrium was moderately dilated. - Tricuspid valve: There was mild-moderate regurgitation. - Pulmonic valve: There was trivial regurgitation. - Pulmonary arteries: PA peak pressure: 66 mm Hg (S).  Impressions:  - The right ventricular systolic pressure was increased consistent  with moderate pulmonary hypertension.    Radiology/Studies  Dg Chest Portable 1 View  03/11/2016  CLINICAL DATA:  Acute left-sided chest pain. EXAM: PORTABLE CHEST 1 VIEW COMPARISON:  May 13, 2015. FINDINGS: Stable cardiomegaly. Status post Coronary artery bypass graft. Left-sided pacemaker is unchanged in position. No pneumothorax is noted. Descending thoracic aortic enlargement is again noted consistent with aneurysm. Increased perihilar and bibasilar interstitial densities are noted concerning for pulmonary edema. No significant pleural effusion is noted. IMPRESSION: Continued presence of descending thoracic aortic aneurysm as noted on prior exams. Stable cardiomegaly. Increased perihilar and bibasilar interstitial densities are noted concerning for pulmonary edema. Electronically Signed   By: Lupita Raider, M.D.   On: 03/11/2016 08:38   Ct Angio Chest Aorta W/cm &/or Wo/cm  03/11/2016  CLINICAL DATA:  Sudden onset of chest pain radiating to back, known thoracic aneurysm, hypertension, coronary artery disease, stroke, COPD, hyperlipidemia, T8, CHF, peripheral vascular disease, former smoker EXAM: CT ANGIOGRAPHY CHEST WITH CONTRAST TECHNIQUE: Multidetector CT imaging of the chest was performed using  the standard protocol during bolus administration of intravenous contrast. Multiplanar CT image reconstructions and MIPs were obtained to evaluate the vascular anatomy. CONTRAST:  80 cc Isovue 370 IV COMPARISON:  05/13/2015 FINDINGS: No definite intramural hematoma on precontrast imaging. Scattered atherosclerotic calcifications aorta and coronary arteries. Pacemaker leads RIGHT atrium and RIGHT ventricle. Prior median sternotomy and CABG. Aneurysmal dilatation descending thoracic aorta on axial images measuring up to: 4.5 cm image 63 4.7 cm image 85 5.6 cm image 109 5.6 cm image 124 at aortic hiatus. At proximal descending thoracic aorta, inhomogeneous attenuation of contrast enhancement of aortic lumen. A discrete intimal flap is not demonstrated on axial or coronal images. Sagittal images show questionable area of thin linear lucency but inconsistent and incomplete. A definite aortic dissection is not identified ; potentially the inhomogeneous enhancement could be related to an an admixture of opacified and non-opacified blood on the basis of poor cardiac output ; does patient have a significantly decreased LV ejection fraction? Pulmonary arteries grossly patent on non targeted exam. Stomach incompletely distended unable to exclude gastric wall thickening at mid stomach. Cystic lesion RIGHT lobe liver medially 3.0 x 2.2 cm image 134. Remaining visualized upper abdomen unremarkable. Enlarged RIGHT paratracheal lymph node at [place] 2.4 cm short axis image  53. Additional mildly prominent precarinal node 12 mm short axis image 53. Question enlarged sub carinal node 16 mm short axis image 62. BILATERAL pleural effusions with compressive atelectasis of the posterior lungs. No pleural effusion or pneumothorax. Osseous structures unremarkable. Review of the MIP images confirms the above findings. IMPRESSION: Aneurysmal dilatation of descending thoracic aorta up to 5.6 cm diameter at aortic hiatus, 5.2 cm previously. No  definite aortic dissection is identified though an area of inhomogeneous aortic luminal enhancement is seen at the proximal descending thoracic aorta, question related to an admixture of opacified non-opacified blood on the basis of poor cardiac output. If patient has persistent pain, may consider followup CT imaging with contrast to reassess aorta. BILATERAL low-attenuation pleural effusions and compressive atelectasis of the posterior lungs. Nonspecific mediastinal adenopathy. Scattered atherosclerotic disease. Electronically Signed   By: Ulyses Southward M.D.   On: 03/11/2016 13:49    ASSESSMENT AND PLAN  1. Chest pain at rest radiating to the back:   - occurred in the setting of hypertensive urgency, unclear if trop elevated related to malignant HTN vs underlying CAD  - CTA of chest negative for dissection, but did mention enlarging descending thoracic aortic aneurysm which i have discussed with patient and family that he will need to discuss with his surgeon - severely elevated BNP of 3000, he does not appears to be significantly fluid overloaded on physical exam  - Originally planned for cath, however renal function worsened, serial came back flat, Dr. Delton See reviewed his record and felt CP maybe related to uncontrolled BP.  - very difficult to control BP as he continue to be hypertensive in 190s, hydralazine increased to 75mg  TID this morning. Also increased Imdur to 90mg . May need outpatient renal artery U/S given difficult to control BP, he did have a CTA of abdomen and pelvis last month, however it did not mention any renal artery stenosis.   - Echo 03/13/2016 EF 35-40%, severe hypokinesis and basal-midlateral myocardium, akinesis of basal-midinferior and entire inferolateral myocardium, moderate AR, mild to moderate MR, mild to moderate TR, PA peak pressure . Compare to previous echo, his EF did drop slightly from 45%.   2. Hypertensive urgency: Which also likely contributed to the  elevated troponin and BNP. We'll need to adjust his medication better, target goal systolic blood pressure 130 to 150s.   -BP improved, amlodipine increased to 10 mg daily, hydralazine added, Imdur increased to 90mg  this morning. No further chest pain. Expect discharge today, PT to see before discharge. Will increase hydralazine to 100mg  TID. Discussed with Dr. Eldridge Dace, will get Vascular surgery opinion on dilated descending aortic aneurysm before he leaves.    3. CAD s/p 4v CABG in 2000 (LIMA to LAD, SVG to diag, SVG to OM4, SVG to PDA)  4. HLD  5. Descending thoracic aortic aneurysm/abdominal bruits: CT of abdomen on 01/28/2016 showed descending thoracic aortic aneurysm maximal dimension 4.3 cm, increased from 3 secondary in 2013, recommended one-year ultrasound. CTA of chest obtained on 4/6 however indicate aneurysmal dilatation of 5.6 cm  6. Hypothyroidism  7. SSS s/p Medtronic PPM (placed by Dr. Jenne Campus 2003 with generator change 08/2010)  8. H/o CVA   Signed, Amedeo Plenty Pager: [phone]   I have examined the patient and reviewed assessment and plan and discussed with patient.  Agree with above as stated.  BP better.  Await word about aneurysm management.  I would like for him to have some followup set up for this large thoracic aortic  aneurysm.  BP control will be important.  He needs f/u of renal function as well.   Allah Reason S.

## 2016-03-15 NOTE — Care Management Note (Signed)
Case Management Note  Patient Details  Name: [name] MRN: [id number] Date of Birth: 07/24/1927  Subjective/Objective:          Chest pain, HTN          Action/Plan: Discharge Planning: AVS reviewed:  NCM spoke to dtr, [name] # [phone]. Offered choice for HH/provided list. States pt had [facility] in the past. Daughter confirmed pt has the following equipment; cane, walker, lift chair, shower chair, shower bar and 3:1 bedside commode at home. Has an Life Alert system. Pt has [employer] benefits. Dtr states she will follow up on getting aide covered under his VA benefits. [facility] for scheduled dc today.   Expected Discharge Date:  03/15/2016            Expected Discharge Plan:  Home w Home Health Services  In-House Referral:  NA  Discharge planning Services  CM Consult  Post Acute Care Choice:  Home Health Choice offered to:  Adult Children  DME Arranged:  N/A DME Agency:  NA  HH Arranged:  RN, PT, Nurse's Aide HH Agency:  [facility]  Status of Service:  Completed, signed off  Medicare Important Message Given:    Date Medicare IM Given:    Medicare IM give by:    Date Additional Medicare IM Given:    Additional Medicare Important Message give by:     If discussed at Long Length of Stay Meetings, dates discussed:    Additional Comments:  Elliot CousinShavis, Kenlynn Houde Ellen, RN 03/15/2016, 3:37 PM

## 2016-03-15 NOTE — Consult Note (Signed)
[facility]  Patient name: [name] MRN: [id number] DOB: Dec 26, 1926 Sex: male  REASON FOR CONSULT: descending thoracic aneurysm   HPI: [name] is a 82 y.o. male, who we've been consulted on regarding increase in known descending thoracic aneurysm size. Dr. Hart RochesterLawson evaluated him on 06/10/15 for diffuse dilatation of the descending thoracic aorta with maximum diameter of 5.1 cm above the diaphragm. He was recommended annual follow-up with CT scan. He also underwent prior right external iliac, common femoral and profundus endarterectomy on 06/25/15 for rest pain by Dr. Arbie CookeyEarly.   [facility] with complaints of chest pain and shortness of breath that woke him up from his sleep. A CTA of the chest was ordered and was negative for aortic dissection. He also had elevated blood pressures in the 190s at that time. His chest pain was felt to be secondary to uncontrolled hypertension. His ECHO from 03/13/16 revealed severe hypokinesis of the basal-midinferior myocardium and akinesis of the entire inferolateral myocardium and basal mid-inferior myocardium. His EF was estimated to be 35-40%. A cardiac cath was planned but cancelled given worsening renal function.   The patient has a past medical history of CAD s/p CABG x 4 in 2000 and stenting in 2015, SSS s/p [employer]tr[employer] PPM, 4.3 cm AAA, CVA, CHF, hyperlipidemia, hypertension, COPD and tobacco abuse.   The patient currently denies any chest pain, abdominal pain or shortness of breath. He denies any rest pain or claudication. He lives at home with his daughter.   Past Medical History  Diagnosis Date  . Hypertension   . CAD (coronary artery disease)     a. s/p MI in 1988;  b. s/p CABG in 2000 ([name]) x 4, LIMA to LAD, SVG to diagonal, [facility], SVG to PDA;  c. 12/2013 NSTEMI/abnl MV;  d. 01/2014 Cath/PCI: native 3VD, VG->RCA 100, VG->OM aneurysmal 40ost/m, 4520m(5.0x12 Veriflex BMS),  VG->Diag aneurysmal, LIMA->LAD nl.  . TIA (transient ischemic attack)   . Hyperlipidemia   . COPD (chronic obstructive pulmonary disease) (HCC)   . CVA (cerebral infarction) 07/2011    Remote left brain infarct with sensory deficits and had a left internal carotid stent placed by Dr. Corliss Skainseveshwar at that time, no recurrent CVA or TIAs or CNS events.  Marland Kitchen. PAD (peripheral artery disease) (HCC)     Stable. Had a past RFPBG by Dr. Arbie CookeyEarly in 2009. this graft is occluded with reconstitution in the popliteal artery, which was patent. Right tibial was occluded and 2-vessel runoff on angiography done by Dr. Myra GianottiBrabham in 12/2010.  . Sick sinus syndrome (HCC)     PPM implanted for this and syncope   . Chronic systolic CHF (congestive heart failure) (HCC)     Hospitalization for CHF from 05/04/13-05/06/13. Treated medically. Had possible pneumonia with short course of antibiotics.  . Abdominal bruit     Loud  . AAA (abdominal aortic aneurysm) (HCC)     Has a known AAA of 3.2 x 3.4 cm by abdominal untrasound 03/27/12. Followup abdominal ultrasound on 03/29/13 showed a diameter of 3.6 cm, which is stable  . Peripheral vascular disease (HCC)   . Hypothyroid     On supplement "don't know if I take RX or not for my thyroid" (01/16/2014)  . Stroke (HCC)   . Syncope     tilt table test/8.10.01/orthostatic hypotension  . Orthostatic hypotension   . Presence of permanent cardiac pacemaker   . Pneumonia   .  Headache     Family History  Problem Relation Age of Onset  . Cancer Brother 60    oral   . Prostate cancer Brother   . Hypertension Brother   . Hyperlipidemia Brother   . Lung disease Father     black lung disease    SOCIAL HISTORY: Social History   Social History  . Marital Status: Widowed    Spouse Name: N/A  . Number of Children: 4  . Years of Education: N/A   Occupational History  . retired     Advice worker, Control and instrumentation engineer   Social History Main Topics  . Smoking status: Former Smoker -- 0.50  packs/day for 68 years    Types: Cigarettes    Quit date: 05/02/2015  . Smokeless tobacco: Never Used  . Alcohol Use: No  . Drug Use: No  . Sexual Activity: Yes   Other Topics Concern  . Not on file   Social History Narrative   Lives in [place], spouse is now deceased.  Receives most care at the [facility].   4 children, 6 grandchildren    Allergies  Allergen Reactions  . Pravachol Other (See Comments)    myalgias  . Simvastatin Other (See Comments)    myalgia  . Other Other (See Comments)    GREEN BEANS AND ONIONS  . Oxycodone Other (See Comments)    Family member reports "he was highly agitated and disoriented"   . Terazosin Other (See Comments)    unknown    Current Facility-Administered Medications  Medication Dose Route Frequency Provider Last Rate Last Dose  . 0.9 %  sodium chloride infusion  250 mL Intravenous PRN Azalee Course, PA      . acetaminophen (TYLENOL) tablet 650 mg  650 mg Oral Q4H PRN Azalee Course, PA   650 mg at 03/15/16 0034  . amLODipine (NORVASC) tablet 10 mg  10 mg Oral Daily Azalee Course, [place]   10 mg at 03/15/16 1016  . aspirin EC tablet 81 mg  81 mg Oral Daily Lars Masson, MD   81 mg at 03/15/16 1016  . atorvastatin (LIPITOR) tablet 40 mg  40 mg Oral q1800 Azalee Course, PA   40 mg at 03/14/16 1825  . clopidogrel (PLAVIX) tablet 75 mg  75 mg Oral QHS Azalee Course, PA   75 mg at 03/14/16 2223  . furosemide (LASIX) tablet 20 mg  20 mg Oral Daily Azalee Course, [place]   20 mg at 03/15/16 1017  . heparin injection 5,000 Units  5,000 Units Subcutaneous 3 times per day Azalee Course, PA   5,000 Units at 03/14/16 2224  . hydrALAZINE (APRESOLINE) tablet 100 mg  100 mg Oral 3 times per day Azalee Course, PA      . isosorbide mononitrate (IMDUR) 24 hr tablet 90 mg  90 mg Oral Daily Azalee Course, PA   90 mg at 03/15/16 1016  . LORazepam (ATIVAN) tablet 0.5 mg  0.5 mg Oral Q12H PRN Laqueta Linden, MD   0.5 mg at 03/15/16 0034  . metoprolol succinate (TOPROL-XL) 24 hr tablet 100 mg  100 mg  Oral Daily Azalee Course, PA   100 mg at 03/15/16 1017  . nitroGLYCERIN (NITROSTAT) SL tablet 0.4 mg  0.4 mg Sublingual Q5 min PRN Benjiman Core, MD   0.4 mg at 03/14/16 1056  . ondansetron (ZOFRAN) injection 4 mg  4 mg Intravenous Q6H PRN Azalee Course, PA      . potassium chloride (K-DUR) CR tablet  10 mEq  10 mEq Oral Daily Azalee Course, [place]   10 mEq at 03/15/16 1017  . sodium chloride flush (NS) 0.9 % injection 3 mL  3 mL Intravenous Q12H Azalee Course, PA   3 mL at 03/15/16 1000  . sodium chloride flush (NS) 0.9 % injection 3 mL  3 mL Intravenous PRN Azalee Course, PA      . tamsulosin (FLOMAX) capsule 0.4 mg  0.4 mg Oral QPC supper Azalee Course, PA   0.4 mg at 03/14/16 1825    REVIEW OF SYSTEMS:   denotes positive finding,  denotes negative finding Cardiac  Comments:  Chest pain or chest pressure:    Shortness of breath upon exertion:    Short of breath when lying flat:    Irregular heart rhythm:        Vascular    Pain in calf, thigh, or hip brought on by ambulation:    Pain in feet at night that wakes you up from your sleep:     Blood clot in your veins:    Leg swelling:         Pulmonary    Oxygen at home:    Productive cough:     Wheezing:         Neurologic    Sudden weakness in arms or legs:     Sudden numbness in arms or legs:     Sudden onset of difficulty speaking or slurred speech:    Temporary loss of vision in one eye:     Problems with dizziness:         Gastrointestinal    Blood in stool:     Vomited blood:         Genitourinary    Burning when urinating:     Blood in urine:        Psychiatric    Major depression:         Hematologic    Bleeding problems:    Problems with blood clotting too easily:        Skin    Rashes or ulcers:        Constitutional    Fever or chills:      PHYSICAL EXAM: Filed Vitals:   03/14/16 1828 03/14/16 2141 03/15/16 0500 03/15/16 1015  BP: 137/77 161/74 150/61 147/60  Pulse: 78 77 85 80  Temp:  97.5 F (36.4 C) 98.2 F (36.8  C)   TempSrc:  Oral Oral   Resp:      Height:      Weight:      SpO2:  97% 93%     GENERAL: The patient is a thin  male, in no acute distress. The vital signs are documented above. CARDIAC: There is a regular rate and rhythm. No carotid bruits.  VASCULAR: 2+ radial and femoral pulses bilaterally. Non palpable popliteal or pedal pulses. Feet are warm and well perfused bilaterally.  PULMONARY: There is good air exchange bilaterally. Mild expiratory wheeze bilaterally.  ABDOMEN: Soft and non-tender. Palpable AAA.  MUSCULOSKELETAL: There are no major deformities or cyanosis. NEUROLOGIC: No focal weakness or paresthesias are detected. SKIN: There are no ulcers or rashes noted. PSYCHIATRIC: The patient has a normal affect.  DATA:  CTA Chest 03/11/2016  IMPRESSION: Aneurysmal dilatation of descending thoracic aorta up to 5.6 cm diameter at aortic hiatus, 5.2 cm previously.  No definite aortic dissection is identified though an area of inhomogeneous aortic luminal enhancement is seen at the proximal descending  thoracic aorta, question related to an admixture of opacified non-opacified blood on the basis of poor cardiac output.  If patient has persistent pain, may consider followup CT imaging with contrast to reassess aorta.  BILATERAL low-attenuation pleural effusions and compressive atelectasis of the posterior lungs.  Nonspecific mediastinal adenopathy.  Scattered atherosclerotic disease.  MEDICAL ISSUES: Descending thoracic aortic aneurysm  The patient was admitted for chest pain in the setting of hypertensive urgency. His chest pain has now resolved. A CTA chest was negative for thoracic dissection. There was an enlargement of the thoracic aorta just above the diaphragm from 5.1 cm (05/13/2015)  to 5.6 cm currently. He also has a 4.2 cm AAA and aneurysm of the bilateral common iliac, external and internal iliac arteries.   No further workup of thoracic aneurysm at this  time. Needs strict BP control. He has multiple medical co morbidities that make him a poor surgical candidate.  Recommend outpatient follow-up and repeat CT scan. Timing to be discussed with Dr. Darrick Penna who will evaluate patient this afternoon.   Maris Berger, PA-C [facility] [place] [phone]   History and exam details as above.  Pt with known thoracic and infrarenal aneurysms.  Was admitted with chest pain which has now resolved.  CT scan of the chest showed thoracic aneurysm.  We were consulted regarding this.  I reviewed all of the patient's CT images including his recent CT angio of the abdomen and pelvis from a few months ago.  His ascending aorta measures 34 mm as well as the arch and proximal descending thoracic aorta.  The descending thoracic aorta dilates to 5.5 cm in the lower chest adjacent to the diaphragm.  In the abdomen he has a 4 cm infrarenal aortic aneurysm as well as a 4 cm left common iliac aneurysm, a 2.5 cm right common iliac aneurysm and a 2 cm right internal iliac aneurysm.  Due to his age and overall debility I would not consider him fit for repair of the thoracic aneurysm as this would not be amenable to stent graft repair due to the size of the ascending and arch.  This would require open thoracoabdominal aneurysm repair and I do not think he would tolerate this.  Fortunately, the aneurysm in the chest is 5.5 cm and elective repair would only be considered at 6 cm.  In any case if it grows to this size I would still not consider him fit for repair.  As far as the abdominal aneurysms the left common iliac is at a size that at least consideration for repair should be discussed.  This does not need to be done during this hospital admission.  The patient can follow up with Dr Early who is familiar with the patient so that they can have this discussion.   Will sign off at this point.  Fabienne Bruns, MD [facility] of  [place] Office: [phone] Pager: [phone]

## 2016-03-15 NOTE — Progress Notes (Signed)
Pharmacist Heart Failure Core Measure Documentation  Assessment: [name] has an EF documented as 35-40% on 03/13/16 by ECHO.  Rationale: Heart failure patients with left ventricular systolic dysfunction (LVSD) and an EF < 40% should be prescribed an angiotensin converting enzyme inhibitor (ACEI) or angiotensin receptor blocker (ARB) at discharge unless a contraindication is documented in the medical record.  This patient is not currently on an ACEI or ARB for HF.  This note is being placed in the record in order to provide documentation that a contraindication to the use of these agents is present for this encounter.  ACE Inhibitor or Angiotensin Receptor Blocker is contraindicated (specify all that apply)  []   ACEI allergy AND ARB allergy []   Angioedema []   Moderate or severe aortic stenosis []   Hyperkalemia []   Hypotension []   Renal artery stenosis [x]   Worsening renal function, preexisting renal disease or dysfunction   [name], [name] 03/15/2016 2:44 PM

## 2016-03-16 ENCOUNTER — Telehealth: Payer: Self-pay | Admitting: Cardiology

## 2016-03-16 MED ORDER — CLOPIDOGREL BISULFATE 75 MG PO TABS: 75.0000 mg | ORAL_TABLET | Freq: Every day | ORAL | Status: DC

## 2016-03-16 NOTE — Telephone Encounter (Addendum)
clopidogrel (PLAVIX) 75 MG tablet Take 1 tablet (75 mg total) by mouth at bedtime         Notified the pt and the daughter that the pt should be taking Plavix 75 mg po daily at bedtime.  Informed both the pt and the daughter that listed in his discharge summary is for the pt to take Plavix 75 mg po at bedtime, and resume in one week.  Informed the daughter that this is written incorrectly, and originated from where the pt was in the hospital last July for anemia and it was held then. Informed the daughter that I corrected this in the pts med list, and he should start back taking plavix 75 mg po daily at bedtime, starting today and thereafter.  Asked the daughter if the pt needed any refills on this med, and per the daughter, the pt has enough on-hand.  Daughter verbalized understanding and agrees with this plan.

## 2016-03-16 NOTE — Telephone Encounter (Signed)
Pt called; daughter ask that we call back as [facility] and wanted to rest.

## 2016-03-16 NOTE — Telephone Encounter (Signed)
New message     Pt was released from the hosp yesterday.  Calling to get instructions on when to start his plavix.  Please call

## 2016-03-16 NOTE — Telephone Encounter (Signed)
New message     TCM appt on 4.17.2017 @ 2:40 pm Per Azalee CourseHao [name]

## 2016-03-17 NOTE — Telephone Encounter (Signed)
[facility]   Patient understands to follow up with provider ? 4/17 @ 2:40 pm with Lisabeth DevoidMeng, PA. Pt asked to arrive 15 minutes early Patient understands discharge instructions? Yes   Patient understands medications and regiment? Yes   Patient understands to bring all medications to this visit? Yes

## 2016-03-19 ENCOUNTER — Telehealth: Payer: Self-pay | Admitting: Vascular Surgery

## 2016-03-19 NOTE — Telephone Encounter (Signed)
-----   Message from Sharee PimpleMarilyn K McChesney, RN sent at 03/16/2016  9:58 AM EDT ----- Regarding: schedule   ----- Message -----    From: Sherren Kernsharles E Fields, MD    Sent: 03/15/2016   5:03 PM      To: [facility] Charge Pool  Pt with thoracoabdominal aortic and iliac aneurysms.  Please schedule pt for office visit with Dr Arbie CookeyEarly in 2-3 weeks to discuss whether on not to repair now or if ever  Fabienne Brunsharles Fields

## 2016-03-19 NOTE — Telephone Encounter (Signed)
sched appt for 4/25 at 3:45. Spoke to pt's daughter to inform pt of appt.

## 2016-03-22 ENCOUNTER — Ambulatory Visit (INDEPENDENT_AMBULATORY_CARE_PROVIDER_SITE_OTHER): Payer: Medicare Other | Admitting: Physician Assistant

## 2016-03-22 ENCOUNTER — Encounter: Payer: Self-pay | Admitting: Physician Assistant

## 2016-03-22 VITALS — BP 128/64 | HR 74 | Ht 69.0 in | Wt 140.4 lb

## 2016-03-22 DIAGNOSIS — Z720 Tobacco use: Secondary | ICD-10-CM

## 2016-03-22 DIAGNOSIS — I255 Ischemic cardiomyopathy: Secondary | ICD-10-CM

## 2016-03-22 DIAGNOSIS — N183 Chronic kidney disease, stage 3 (moderate): Secondary | ICD-10-CM

## 2016-03-22 DIAGNOSIS — I712 Thoracic aortic aneurysm, without rupture, unspecified: Secondary | ICD-10-CM

## 2016-03-22 DIAGNOSIS — R079 Chest pain, unspecified: Secondary | ICD-10-CM

## 2016-03-22 DIAGNOSIS — Z950 Presence of cardiac pacemaker: Secondary | ICD-10-CM

## 2016-03-22 DIAGNOSIS — I25119 Atherosclerotic heart disease of native coronary artery with unspecified angina pectoris: Secondary | ICD-10-CM

## 2016-03-22 DIAGNOSIS — I5022 Chronic systolic (congestive) heart failure: Secondary | ICD-10-CM

## 2016-03-22 DIAGNOSIS — I110 Hypertensive heart disease with heart failure: Secondary | ICD-10-CM

## 2016-03-22 LAB — BASIC METABOLIC PANEL
BUN: 17 mg/dL (ref 7–25)
CO2: 21 mmol/L (ref 20–31)
Calcium: 8.3 mg/dL — ABNORMAL LOW (ref 8.6–10.3)
Chloride: 105 mmol/L (ref 98–110)
Creat: 1.59 mg/dL — ABNORMAL HIGH (ref 0.70–1.11)
Glucose, Bld: 95 mg/dL (ref 65–99)
Potassium: 4 mmol/L (ref 3.5–5.3)
SODIUM: 139 mmol/L (ref 135–146)

## 2016-03-22 NOTE — Progress Notes (Signed)
Cardiology Office Note:    Date:  03/22/2016   ID:  Fransico Him, DOB 1927-05-02, MRN [id number]  PCP:  Delorse Lek, MD  Cardiologist:  Dr. Tobias Alexander   Electrophysiologist:  [facility] in [place], [place]  VVS: Dr. [name]  Referring MD: Delorse Lek, MD   Chief Complaint  Patient presents with  . Hospitalization Follow-up    s/p NSTEMI    History of Present Illness:     [name] is a 82 y.o. male with a hx of CAD status post CABG, PAD, prior stroke, COPD, hypothyroidism, symptomatic bradycardia and syncope status post pacemaker implantation, tobacco abuse, thoracic and infrarenal abdominal aortic aneurysms. Admitted 1/15 with non-STEMI. He underwent PCI with BMS to the SVG-OM. He underwent right external iliac and common femoral and profundus endarterectomy in 7/16. He is followed by vascular surgery. Last seen by Dr. Delton See 2/17.    Admitted 4/6-4/10 with NSTEMI.  Troponin levels were mildly elevated without clear trend (? Demand ischemia).  CXR and BNP were concerning for CHF and he was given a dose of IV Lasix.  His BP was uncontrolled.  Cardiac catheterization was originally planned. However, his renal function deteriorated some and LHC was postponed. Hospital team reviewed with Dr. Delton See and it was decided to work on controlling his blood pressure and managing his non-STEMI medically. Elevated Troponin levels may have been related to demand ischemia in the setting of HTN urgency.  Nitrates and hydralazine doses were adjusted. Follow-up echocardiogram demonstrated EF 35-40% with lateral hypokinesis and inferior and inferolateral akinesis.  FU CTA of chest demonstrated worsening of his descending thoracic aorta up to 5.6 cm.  He was seen by Dr. Darrick Penna for VVS. Aneurysm size was not yet at cut off for repair. However, he was felt to be high risk for repair. OP FU with VVS (Dr. [name]) was recommended.    Returns for FU.  Here alone. Since discharge, he has been doing well.  Denies recurrent chest discomfort. Denies significant dyspnea. Denies orthopnea, PND or edema. Denies syncope or near syncope. Denies any coughing or wheezing. Denies any bleeding issues. Appetite is good.   Past Medical History  Diagnosis Date  . Hypertension   . CAD (coronary artery disease)     a. s/p MI in 1988;  b. s/p CABG in 2000 (Dr. Barry Dienes) x 4, LIMA to LAD, SVG to diagonal, SVG to [facility], SVG to PDA;  c. 12/2013 NSTEMI/abnl MV;  d. 01/2014 Cath/PCI: native 3VD, VG->RCA 100, VG->OM aneurysmal 40ost/m, 45m(5.0x12 Veriflex BMS), VG->Diag aneurysmal, LIMA->LAD nl.  . TIA (transient ischemic attack)   . Hyperlipidemia   . COPD (chronic obstructive pulmonary disease) (HCC)   . CVA (cerebral infarction) 07/2011    Remote left brain infarct with sensory deficits and had a left internal carotid stent placed by Dr. [name] at that time, no recurrent CVA or TIAs or CNS events.  Marland Kitchen PAD (peripheral artery disease) (HCC)     Stable. Had a past RFPBG by Dr. [name] in 2009. this graft is occluded with reconstitution in the popliteal artery, which was patent. Right tibial was occluded and 2-vessel runoff on angiography done by [name] in 12/2010.  . Sick sinus syndrome (HCC)     PPM implanted for this and syncope   . Chronic systolic CHF (congestive heart failure) (HCC)     Hospitalization for CHF from 05/04/13-05/06/13. Treated medically. Had possible pneumonia with short course of antibiotics.  . Abdominal bruit     Loud  .  AAA (abdominal aortic aneurysm) (HCC)     Has a known AAA of 3.2 x 3.4 cm by abdominal untrasound 03/27/12. Followup abdominal ultrasound on 03/29/13 showed a diameter of 3.6 cm, which is stable  . Peripheral vascular disease (HCC)   . Hypothyroid     On supplement "don't know if I take RX or not for my thyroid" (01/16/2014)  . Stroke (HCC)   . Syncope     tilt table test/8.10.01/orthostatic hypotension  . Orthostatic hypotension   . Presence of permanent cardiac pacemaker   .  Pneumonia   . Headache     Past Surgical History  Procedure Laterality Date  . Femoral bypass Right ?2001  . Carotid stent Left 06/23/11    By [name]  . Coronary artery bypass graft  2000    1st in 2000 (Dr. Barry Dienes) x 4, LIMA to LAD, SVG to diagonal, SVG to OM4, SVG to PDA. Re-cath in May 2012 with patent LIMA to LAD, patent SVG to diagonal, patent SVG to OM with left-to-right collaterals to an occluded right.  . Coronary angioplasty with stent placement  2001-01/16/2014    "think today makes #4" (01/16/2014)  . Cardiac catheterization  2000  . Inguinal hernia repair Right 1980's  . Insert / replace / remove pacemaker      implanted 2003 by Dr Jenne Campus with gen change (MDT [id number]) [facility]  . Cataract extraction w/ intraocular lens  implant, bilateral Bilateral   . Abdominal aortagram N/A 02/22/2012    Procedure: ABDOMINAL AORTAGRAM;  Surgeon: Nada Libman, MD;  Location[facility] CATH LAB;  Service: Cardiovascular;  Laterality: N/A;  . Carotid angiogram Bilateral 02/22/2012    Procedure: CAROTID ANGIOGRAM;  Surgeon: Nada Libman, MD;  Location: [facility] CATH LAB;  Service: Cardiovascular;  Laterality: Bilateral;  . Left heart catheterization with coronary/graft angiogram N/A 01/16/2014    Procedure: LEFT HEART CATHETERIZATION WITH Isabel Caprice;  Surgeon: Kathleene Hazel, MD;  Location: [facility] CATH LAB;  Service: Cardiovascular;  Laterality: N/A;  . Abdominal aortagram    . Colonoscopy    . Peripheral vascular catheterization N/A 06/24/2015    Procedure: Abdominal Aortogram;  Surgeon: Nada Libman, MD;  Location: [facility] INVASIVE CV LAB;  Service: Cardiovascular;  Laterality: N/A;  . Endarterectomy femoral Right 06/25/2015    Procedure: ENDARTERECTOMY RIGHT FEMORAL ARTERY ;  Surgeon: Larina Earthly, MD;  Location: [facility] OR;  Service: Vascular;  Laterality: Right;  . Patch angioplasty Right 06/25/2015    Procedure: RIGHT COMMON FEMORAL ARTERY AND PROFUNDA PATCH ANGIOPLASTY USING  HEMASHIELD PLATINUM FINESSE PATCH ;  Surgeon: Larina Earthly, MD;  Location: [facility] OR;  Service: Vascular;  Laterality: Right;    Current Medications: Outpatient Prescriptions Prior to Visit  Medication Sig Dispense Refill  . acetaminophen (TYLENOL) 325 MG tablet Take 650 mg by mouth every 6 (six) hours as needed. Reported on 11/25/2015    . amLODipine (NORVASC) 10 MG tablet Take 1 tablet (10 mg total) by mouth daily. 90 tablet 3  . aspirin 81 MG tablet Take 81 mg by mouth daily.    Marland Kitchen atorvastatin (LIPITOR) 40 MG tablet Take 1 tablet (40 mg total) by mouth daily at 6 PM. 30 tablet 5  . clopidogrel (PLAVIX) 75 MG tablet Take 1 tablet (75 mg total) by mouth at bedtime.    . furosemide (LASIX) 20 MG tablet Take 1 tablet (20 mg total) by mouth daily. 90 tablet 3  . hydrALAZINE (APRESOLINE) 100 MG tablet Take  1 tablet (100 mg total) by mouth every 8 (eight) hours. 90 tablet 11  . isosorbide mononitrate (IMDUR) 60 MG 24 hr tablet Take 1.5 tablets (90 mg total) by mouth daily. 45 tablet 5  . metoprolol succinate (TOPROL-XL) 100 MG 24 hr tablet Take 100 mg by mouth daily. Take with or immediately following a meal.    . nitroGLYCERIN (NITROSTAT) 0.4 MG SL tablet Place 1 tablet (0.4 mg total) under the tongue every 5 (five) minutes as needed. For chest pain 25 tablet 3  . potassium chloride (K-DUR) 10 MEQ tablet Take 1 tablet (10 mEq total) by mouth daily. 90 tablet 3  . Tamsulosin HCl (FLOMAX) 0.4 MG CAPS Take 0.4 mg by mouth daily after supper.      No facility-administered medications prior to visit.     Allergies:   Pravachol; Pravastatin; Simvastatin; Other; Oxycodone; and Terazosin   Social History   Social History  . Marital Status: Widowed    Spouse Name: N/A  . Number of Children: 4  . Years of Education: N/A   Occupational History  . retired     Advice workertruck mechanic, [employer]   Social History Main Topics  . Smoking status: Former Smoker -- 0.50 packs/day for 68 years    Types: Cigarettes     Quit date: 05/02/2015  . Smokeless tobacco: Never Used  . Alcohol Use: No  . Drug Use: No  . Sexual Activity: Yes   Other Topics Concern  . None   Social History Narrative   [place], spouse is now deceased.  [facility]   4 children, 6 grandchildren     Family History:  The patient's    family history includes Cancer (age of onset: 3960) in his brother; Hyperlipidemia in his brother; Hypertension in his brother; Lung disease in his father; Prostate cancer in his brother.   ROS:   Please see the history of present illness.    Review of Systems  Constitution: Positive for chills, decreased appetite, malaise/fatigue and weight loss.  HENT: Positive for headaches.   Cardiovascular: Positive for chest pain and dyspnea on exertion.  Respiratory: Positive for shortness of breath.   Musculoskeletal: Positive for joint pain.  Gastrointestinal: Positive for nausea.  Neurological: Positive for dizziness and loss of balance.  Psychiatric/Behavioral: The patient is nervous/anxious.    All other systems reviewed and are negative.   Physical Exam:    VS:  BP 128/64 mmHg  Pulse 74  Ht 5\' 9"  (1.753 m)  Wt 140 lb 6.4 oz (63.685 kg)  BMI 20.72 kg/m2   GEN: Well nourished, well developed, in no acute distress HEENT: normal Neck: no JVD, no masses Cardiac: Normal S1/S2,  RRR; no murmurs, rubs, or gallops, trace-1+ RLE edema (chronic RLE edema)     Respiratory: Decreased breath sounds bilaterally; no wheezing, rhonchi or rales GI: soft, nontender, nondistended MS: no deformity or atrophy Skin: warm and dry Neuro: No focal deficits  Psych: Alert and oriented x 3, normal affect  Wt Readings from Last 3 Encounters:  03/22/16 140 lb 6.4 oz (63.685 kg)  03/14/16 136 lb 9.6 oz (61.961 kg)  02/02/16 143 lb (64.864 kg)      Studies/Labs Reviewed:     EKG:  EKG is  ordered today.  The ekg ordered today demonstrates A paced, HR 74, TWI V2-6, QTc 448 ms, no  significant changes when compare with prior tracing.   Recent Labs: 01/28/2016: ALT 9* 01/31/2016: Magnesium  2.1 03/11/2016: B Natriuretic Peptide 2999.7* 03/15/2016: BUN 17; Creatinine, Ser 1.38*; Hemoglobin 13.0; Platelets 173; Potassium 3.5; Sodium 143   Recent Lipid Panel    Component Value Date/Time   CHOL 101 07/06/2011 0635   TRIG 61 07/06/2011 0635   HDL 39* 07/06/2011 0635   CHOLHDL 2.6 07/06/2011 0635   VLDL 12 07/06/2011 0635   LDLCALC 50 07/06/2011 0635    Additional studies/ records that were reviewed today include:   Chest CTA  03/11/16 IMPRESSION: Aneurysmal dilatation of descending thoracic aorta up to 5.6 cm diameter at aortic hiatus, 5.2 cm previously. No definite aortic dissection is identified though an area of inhomogeneous aortic luminal enhancement is seen at the proximal descending thoracic aorta, question related to an admixture of opacified non-opacified blood on the basis of poor cardiac output. If patient has persistent pain, may consider followup CT imaging with contrast to reassess aorta. BILATERAL low-attenuation pleural effusions and compressive atelectasis of the posterior lungs. Nonspecific mediastinal adenopathy. Scattered atherosclerotic disease.  Echo 03/13/16 Mild focal basal septal hypertrophy, EF 35-40%, lateral HK, inferior and inferolateral akinesis, moderate AI, mild to moderate MR, moderate RAE, mild to moderate TR, PASP 66 mmHg  ABI 12/16 R 0.62; L 0.63  Myoview 6/16 Inferolateral infarct, small apical infarct, no ischemia, EF 32%; high risk based upon multiple infarct areas and low EF  Echo 12/15 GLS -14.5, EF 45%, diffuse HK, mild to moderate AI, mild MR, mild to moderate LAE, mild to moderate RAE, moderate TR, PASP 35 mm Hg  LHC 2/15 Left main: Distal 30% stenosis.  Left Anterior Descending Artery:  prox 70% followed by mid 100% Circumflex Artery:  mid occlusion.  Right Coronary Artery:  proximal  100%   The vein graft to the  distal vessel is known to be occluded.  Graft Anatomy:  SVG to RCA is occluded SVG to OM is patent with 40% ostial stenosis, diffuse aneurysmal enlargement of entire vein graft with 40% mid body stenosis and 99% hazy mid body stenosis.  SVG to Diagonal is patent but the entire graft is aneurysmal with diffuse disease, TIMI-1 flow. Not a target for PCI LIMA to LAD is patent.  Left Ventricular Angiogram: Deferred.  PCI: 5.0 x 12 Veriflex bare metal stent in the mid body of the SVG to the OM Impression: 1. Severe triple vessel CAD with 3/4 patent grafts.  2. Recent NSTEMI and lateral wall ischemia on stress test secondary to severe disease in body of SVG to OM 3. Successful PTCA/bare metal stent x 1 mid body of SVG to OM Recommendations: He will need ASA and Plavix for at least one month but longer if he tolerates well. Continue other cardiac meds. D/C home tomorrow if stable with f/u with Dr. Delton See in 2-3 weeks.    ASSESSMENT:     1. Coronary artery disease involving native coronary artery of native heart with angina pectoris (HCC)   2. Thoracic aortic aneurysm without rupture (HCC)   3. Chronic systolic CHF (congestive heart failure) (HCC)   4. Ischemic cardiomyopathy   5. CKD (chronic kidney disease), stage 3 (moderate)   6. Hypertensive heart disease with heart failure (HCC)   7. Tobacco abuse   8. Cardiac pacemaker in situ     PLAN:     In order of problems listed above:  1. CAD - s/p prior CABG and NSTEMI in 1/15 tx with DES to S-OM.  Recent admit to [facility] with elevated troponin levels in the setting of a/c systolic  CHF and HTN urgency. LHC was cancelled due to worsening Creatinine.  He is doing better and denies further angina.  Continue med Rx. Continue ASA, Plavix, statin, nitrates, beta-blocker.  2. Thoracic Aneurysm - FU with Dr. [name] this week as planned.   3. Chronic Systolic CHF - Volume stable.  Get FU BMET today.    4. Ischemic CM - Continue beta-blocker,  hydralazine, nitrates.  He is not on ACE inhibitor or angiotensin receptor blocker due to CKD.  Recent EF stable at 35-40%.    5. CKD - Check BMET today.   6. HTN - Controlled.  Continue current Rx.   7. Tobacco abuse - He is not ready to quit.   8. S/p PPM - Followed by [facility] in [place], [place]   Medication Adjustments/Labs and Tests Ordered: Current medicines are reviewed at length with the patient today.  Concerns regarding medicines are outlined above.  Medication changes, Labs and Tests ordered today are outlined in the Patient Instructions noted below. Patient Instructions  Medication Instructions:  Your physician recommends that you continue on your current medications as directed. Please refer to the Current Medication list given to you today. Labwork: TODAY BMET Testing/Procedures: NONE Follow-Up: KEEP YOUR APPT WITH DR. Delton See 04/2016 Any Other Special Instructions Will Be Listed Below (If Applicable). If you need a refill on your cardiac medications before your next appointment, please call your pharmacy.     Signed, Tereso Newcomer, PA-C  03/22/2016 5:40 PM    [facility] [address] [place], [place], [place]  [address] Phone: [phone]; Fax: [phone]

## 2016-03-22 NOTE — Patient Instructions (Addendum)
Medication Instructions:  Your physician recommends that you continue on your current medications as directed. Please refer to the Current Medication list given to you today. Labwork: TODAY BMET Testing/Procedures: NONE Follow-Up: KEEP YOUR APPT WITH DR. Delton SeeNELSON 04/2016 Any Other Special Instructions Will Be Listed Below (If Applicable). If you need a refill on your cardiac medications before your next appointment, please call your pharmacy.

## 2016-03-23 ENCOUNTER — Telehealth: Payer: Self-pay | Admitting: *Deleted

## 2016-03-23 NOTE — Telephone Encounter (Signed)
Pt notified of lab results by phone with verbal understanding.  

## 2016-03-24 ENCOUNTER — Encounter: Payer: Self-pay | Admitting: Vascular Surgery

## 2016-03-28 DIAGNOSIS — Z09 Encounter for follow-up examination after completed treatment for conditions other than malignant neoplasm: Secondary | ICD-10-CM | POA: Insufficient documentation

## 2016-03-28 DIAGNOSIS — I10 Essential (primary) hypertension: Secondary | ICD-10-CM | POA: Insufficient documentation

## 2016-03-30 ENCOUNTER — Encounter: Payer: Self-pay | Admitting: Vascular Surgery

## 2016-03-30 ENCOUNTER — Ambulatory Visit (INDEPENDENT_AMBULATORY_CARE_PROVIDER_SITE_OTHER): Payer: Medicare Other | Admitting: Vascular Surgery

## 2016-03-30 VITALS — BP 153/91 | HR 77 | Temp 96.9°F | Resp 18 | Ht 69.0 in | Wt 140.8 lb

## 2016-03-30 DIAGNOSIS — I712 Thoracic aortic aneurysm, without rupture, unspecified: Secondary | ICD-10-CM

## 2016-03-30 NOTE — Progress Notes (Signed)
Filed Vitals:   03/30/16 1511 03/30/16 1513  BP: 160/85 153/91  Pulse: 77   Temp: 96.9 F (36.1 C)   TempSrc: Oral   Resp: 18   Height: 5\' 9"  (1.753 m)   Weight: 140 lb 12.8 oz (63.866 kg)   SpO2: 100%

## 2016-03-30 NOTE — Progress Notes (Signed)
[facility] [place]  Patient name: [name] MRN: [id number] DOB: 04-May-1927 Sex: male  REASON FOR VISIT: Discuss CT finding of enlarging thoracic aneurysm  HPI: [name] is a 82 y.o. male 9 to me from prior right external common and profundus endarterectomy for critical limb ischemia of his right foot. Surgery was in July 2016. He recently had coronary intervention. During his evaluation he had a CT scan which showed increasing size of a known thoracic aneurysm. Maximal diameter is now proximally 5.6 cm. Also reviewed CT scan for of evaluation of flank pain in February 2017. This revealed a 3 cm iliac artery aneurysms bilaterally. He is quite active. He reports that he just disassembled a engine from a 1950 mercury and is going to reassemble it is getting a running. His wife of 59 years died 3 years ago. He does have children in the area.  Past Medical History  Diagnosis Date  . Hypertension   . CAD (coronary artery disease)     a. s/p MI in 1988;  b. s/p CABG in 2000 (Dr. Barry Dienes) x 4, LIMA to LAD, SVG to diagonal, SVG to [facility], SVG to PDA;  c. 12/2013 NSTEMI/abnl MV;  d. 01/2014 Cath/PCI: native 3VD, VG->RCA 100, VG->OM aneurysmal 40ost/m, 25m(5.0x12 Veriflex BMS), VG->Diag aneurysmal, LIMA->LAD nl.  . TIA (transient ischemic attack)   . Hyperlipidemia   . COPD (chronic obstructive pulmonary disease) (HCC)   . CVA (cerebral infarction) 07/2011    Remote left brain infarct with sensory deficits and had a left internal carotid stent placed by Dr. [name] at that time, no recurrent CVA or TIAs or CNS events.  Marland Kitchen PAD (peripheral artery disease) (HCC)     Stable. Had a past RFPBG by Dr. [name] in 2009. this graft is occluded with reconstitution in the popliteal artery, which was patent. Right tibial was occluded and 2-vessel runoff on angiography done by [name] in 12/2010.  . Sick sinus syndrome (HCC)     PPM implanted for this and syncope   . Chronic systolic CHF  (congestive heart failure) (HCC)     Hospitalization for CHF from 05/04/13-05/06/13. Treated medically. Had possible pneumonia with short course of antibiotics.  . Abdominal bruit     Loud  . AAA (abdominal aortic aneurysm) (HCC)     Has a known AAA of 3.2 x 3.4 cm by abdominal untrasound 03/27/12. Followup abdominal ultrasound on 03/29/13 showed a diameter of 3.6 cm, which is stable  . Peripheral vascular disease (HCC)   . Hypothyroid     On supplement "don't know if I take RX or not for my thyroid" (01/16/2014)  . Stroke (HCC)   . Syncope     tilt table test/8.10.01/orthostatic hypotension  . Orthostatic hypotension   . Presence of permanent cardiac pacemaker   . Pneumonia   . Headache     Family History  Problem Relation Age of Onset  . Cancer Brother 60    oral   . Prostate cancer Brother   . Hypertension Brother   . Hyperlipidemia Brother   . Lung disease Father     black lung disease    SOCIAL HISTORY: Social History  Substance Use Topics  . Smoking status: Former Smoker -- 0.50 packs/day for 68 years    Types: Cigarettes    Quit date: 05/02/2015  . Smokeless tobacco: Never Used  . Alcohol Use: No    Allergies  Allergen Reactions  . Pravachol Other (See Comments)  myalgias  . Pravastatin Other (See Comments)    myalgias  . Simvastatin Other (See Comments)    myalgia  . Other Other (See Comments)    GREEN BEANS AND ONIONS  . Oxycodone Other (See Comments)    Family member reports "he was highly agitated and disoriented"   . Terazosin Other (See Comments)    unknown    Current Outpatient Prescriptions  Medication Sig Dispense Refill  . acetaminophen (TYLENOL) 325 MG tablet Take 650 mg by mouth every 6 (six) hours as needed. Reported on 11/25/2015    . amLODipine (NORVASC) 10 MG tablet Take 1 tablet (10 mg total) by mouth daily. 90 tablet 3  . aspirin 81 MG tablet Take 81 mg by mouth daily.    Marland Kitchen atorvastatin (LIPITOR) 40 MG tablet Take 1 tablet (40 mg  total) by mouth daily at 6 PM. 30 tablet 5  . clopidogrel (PLAVIX) 75 MG tablet Take 1 tablet (75 mg total) by mouth at bedtime.    . furosemide (LASIX) 20 MG tablet Take 1 tablet (20 mg total) by mouth daily. 90 tablet 3  . hydrALAZINE (APRESOLINE) 100 MG tablet Take 1 tablet (100 mg total) by mouth every 8 (eight) hours. 90 tablet 11  . isosorbide mononitrate (IMDUR) 60 MG 24 hr tablet Take 1.5 tablets (90 mg total) by mouth daily. 45 tablet 5  . metoprolol succinate (TOPROL-XL) 100 MG 24 hr tablet Take 100 mg by mouth daily. Take with or immediately following a meal.    . nitroGLYCERIN (NITROSTAT) 0.4 MG SL tablet Place 1 tablet (0.4 mg total) under the tongue every 5 (five) minutes as needed. For chest pain 25 tablet 3  . potassium chloride (K-DUR) 10 MEQ tablet Take 1 tablet (10 mEq total) by mouth daily. 90 tablet 3  . Tamsulosin HCl (FLOMAX) 0.4 MG CAPS Take 0.4 mg by mouth daily after supper.      No current facility-administered medications for this visit.    REVIEW OF SYSTEMS:   denotes positive finding,  denotes negative finding Cardiac  Comments:  Chest pain or chest pressure:    Shortness of breath upon exertion:    Short of breath when lying flat:    Irregular heart rhythm:        Vascular    Pain in calf, thigh, or hip brought on by ambulation:    Pain in feet at night that wakes you up from your sleep:     Blood clot in your veins:    Leg swelling:  x       Pulmonary    Oxygen at home:    Productive cough:     Wheezing:         Neurologic    Sudden weakness in arms or legs:     Sudden numbness in arms or legs:     Sudden onset of difficulty speaking or slurred speech:    Temporary loss of vision in one eye:     Problems with dizziness:  x       Gastrointestinal    Blood in stool:     Vomited blood:         Genitourinary    Burning when urinating:     Blood in urine:        Psychiatric    Major depression:         Hematologic    Bleeding  problems:    Problems with blood clotting too easily:  Skin    Rashes or ulcers:        Constitutional    Fever or chills:      PHYSICAL EXAM: Filed Vitals:   03/30/16 1511 03/30/16 1513  BP: 160/85 153/91  Pulse: 77   Temp: 96.9 F (36.1 C)   TempSrc: Oral   Resp: 18   Height: 5\' 9"  (1.753 m)   Weight: 140 lb 12.8 oz (63.866 kg)   SpO2: 100%     GENERAL: The patient is a well-nourished male, in no acute distress. The vital signs are documented above. CARDIAC: There is a regular rate and rhythm.  VASCULAR: Palpable radial pulses PULMONARY: There is good air exchange  ABDOMEN: Soft and non-tender  MUSCULOSKELETAL: There are no major deformities or cyanosis. NEUROLOGIC: No focal weakness or paresthesias are detected. SKIN: There are no ulcers or rashes noted. PSYCHIATRIC: The patient has a normal affect.  DATA:  I did review the actual films from CT of his chest and abdomen and pelvis. Would potentially be a candidate for stent graft repair of his thoracic aorta although it does extend over the entire thoracic aorta. Also has moderate size iliac artery aneurysms. Has bilateral pleural effusions as well  MEDICAL ISSUES: Discussed options at length with the patient. He is completely asymptomatic from his aneurysms. Explained significant risk with surgery to correct this and feels that in all likelihood his risk for surgery outweighs his risk for rupture. Explained that if this did rupture even is either his iliac or thoracic aneurysm that would be fatal. Explain the potential risk for paralysis with a thoracic stent graft repair. He is not interested in pursuing surgery. Is quite active and independent currently and wished to stay that way. I certainly agree with this decision. He will see us again on an as-needed basis    Iyona Pehrson [facility] [place] [facility]: [phone]

## 2016-04-22 ENCOUNTER — Telehealth: Payer: Self-pay | Admitting: Cardiology

## 2016-04-26 ENCOUNTER — Encounter: Payer: Self-pay | Admitting: Cardiology

## 2016-04-26 ENCOUNTER — Ambulatory Visit (INDEPENDENT_AMBULATORY_CARE_PROVIDER_SITE_OTHER): Payer: Medicare Other | Admitting: Cardiology

## 2016-04-26 VITALS — BP 162/80 | HR 80 | Ht 69.0 in | Wt 138.0 lb

## 2016-04-26 DIAGNOSIS — N183 Chronic kidney disease, stage 3 (moderate): Secondary | ICD-10-CM

## 2016-04-26 DIAGNOSIS — I712 Thoracic aortic aneurysm, without rupture, unspecified: Secondary | ICD-10-CM

## 2016-04-26 DIAGNOSIS — I25119 Atherosclerotic heart disease of native coronary artery with unspecified angina pectoris: Secondary | ICD-10-CM | POA: Diagnosis not present

## 2016-04-26 DIAGNOSIS — Z950 Presence of cardiac pacemaker: Secondary | ICD-10-CM

## 2016-04-26 DIAGNOSIS — I5022 Chronic systolic (congestive) heart failure: Secondary | ICD-10-CM

## 2016-04-26 DIAGNOSIS — I255 Ischemic cardiomyopathy: Secondary | ICD-10-CM | POA: Diagnosis not present

## 2016-04-26 DIAGNOSIS — I110 Hypertensive heart disease with heart failure: Secondary | ICD-10-CM

## 2016-04-26 DIAGNOSIS — Z720 Tobacco use: Secondary | ICD-10-CM

## 2016-04-26 NOTE — Patient Instructions (Signed)
Your physician recommends that you continue on your current medications as directed. Please refer to the Current Medication list given to you today.    Your physician recommends that you schedule a follow-up appointment in: 3 MONTHS WITH DR NELSON  
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
## 2016-05-13 ENCOUNTER — Emergency Department (HOSPITAL_COMMUNITY)
Admission: EM | Admit: 2016-05-13 | Discharge: 2016-05-14 | Disposition: A | Discharge status: Other Acute Inpatient Hospital | Payer: Non-veteran care | Attending: Emergency Medicine | Admitting: Emergency Medicine

## 2016-05-13 ENCOUNTER — Emergency Department (HOSPITAL_COMMUNITY): Payer: Non-veteran care

## 2016-05-13 ENCOUNTER — Encounter (HOSPITAL_COMMUNITY): Payer: Self-pay | Admitting: Emergency Medicine

## 2016-05-13 DIAGNOSIS — Z8673 Personal history of transient ischemic attack (TIA), and cerebral infarction without residual deficits: Secondary | ICD-10-CM | POA: Diagnosis not present

## 2016-05-13 DIAGNOSIS — E785 Hyperlipidemia, unspecified: Secondary | ICD-10-CM | POA: Insufficient documentation

## 2016-05-13 DIAGNOSIS — R778 Other specified abnormalities of plasma proteins: Secondary | ICD-10-CM

## 2016-05-13 DIAGNOSIS — Z8639 Personal history of other endocrine, nutritional and metabolic disease: Secondary | ICD-10-CM | POA: Diagnosis not present

## 2016-05-13 DIAGNOSIS — I739 Peripheral vascular disease, unspecified: Secondary | ICD-10-CM | POA: Insufficient documentation

## 2016-05-13 DIAGNOSIS — Y658 Other specified misadventures during surgical and medical care: Secondary | ICD-10-CM | POA: Diagnosis not present

## 2016-05-13 DIAGNOSIS — T827XXA Infection and inflammatory reaction due to other cardiac and vascular devices, implants and grafts, initial encounter: Secondary | ICD-10-CM | POA: Diagnosis not present

## 2016-05-13 DIAGNOSIS — Z8701 Personal history of pneumonia (recurrent): Secondary | ICD-10-CM | POA: Diagnosis not present

## 2016-05-13 DIAGNOSIS — L03313 Cellulitis of chest wall: Secondary | ICD-10-CM | POA: Diagnosis not present

## 2016-05-13 DIAGNOSIS — Z79899 Other long term (current) drug therapy: Secondary | ICD-10-CM | POA: Diagnosis not present

## 2016-05-13 DIAGNOSIS — J449 Chronic obstructive pulmonary disease, unspecified: Secondary | ICD-10-CM | POA: Diagnosis not present

## 2016-05-13 DIAGNOSIS — I5022 Chronic systolic (congestive) heart failure: Secondary | ICD-10-CM | POA: Insufficient documentation

## 2016-05-13 DIAGNOSIS — R7989 Other specified abnormal findings of blood chemistry: Secondary | ICD-10-CM | POA: Diagnosis not present

## 2016-05-13 DIAGNOSIS — I10 Essential (primary) hypertension: Secondary | ICD-10-CM | POA: Insufficient documentation

## 2016-05-13 DIAGNOSIS — R079 Chest pain, unspecified: Secondary | ICD-10-CM | POA: Diagnosis present

## 2016-05-13 DIAGNOSIS — Z7902 Long term (current) use of antithrombotics/antiplatelets: Secondary | ICD-10-CM | POA: Insufficient documentation

## 2016-05-13 DIAGNOSIS — Z87891 Personal history of nicotine dependence: Secondary | ICD-10-CM | POA: Insufficient documentation

## 2016-05-13 LAB — I-STAT TROPONIN, ED
TROPONIN I, POC: 0.2 ng/mL — AB (ref 0.00–0.08)
Troponin i, poc: 0.16 ng/mL (ref 0.00–0.08)

## 2016-05-13 LAB — DIFFERENTIAL
BASOS PCT: 0 %
Basophils Absolute: 0 10*3/uL (ref 0.0–0.1)
EOS PCT: 1 %
Eosinophils Absolute: 0.2 10*3/uL (ref 0.0–0.7)
Lymphocytes Relative: 33 %
Lymphs Abs: 4.4 10*3/uL — ABNORMAL HIGH (ref 0.7–4.0)
MONOS PCT: 5 %
Monocytes Absolute: 0.7 10*3/uL (ref 0.1–1.0)
Neutro Abs: 7.9 10*3/uL — ABNORMAL HIGH (ref 1.7–7.7)
Neutrophils Relative %: 61 %

## 2016-05-13 LAB — CBC
HCT: 43.5 % (ref 39.0–52.0)
Hemoglobin: 14.6 g/dL (ref 13.0–17.0)
MCH: 31.7 pg (ref 26.0–34.0)
MCHC: 33.6 g/dL (ref 30.0–36.0)
MCV: 94.4 fL (ref 78.0–100.0)
PLATELETS: 127 10*3/uL — AB (ref 150–400)
RBC: 4.61 MIL/uL (ref 4.22–5.81)
RDW: 15.1 % (ref 11.5–15.5)
WBC: 13.2 10*3/uL — AB (ref 4.0–10.5)

## 2016-05-13 LAB — BASIC METABOLIC PANEL
Anion gap: 11 (ref 5–15)
BUN: 18 mg/dL (ref 6–20)
CALCIUM: 9 mg/dL (ref 8.9–10.3)
CO2: 24 mmol/L (ref 22–32)
CREATININE: 1.57 mg/dL — AB (ref 0.61–1.24)
Chloride: 104 mmol/L (ref 101–111)
GFR calc Af Amer: 44 mL/min — ABNORMAL LOW (ref >60)
GFR, EST NON AFRICAN AMERICAN: 38 mL/min — AB (ref >60)
Glucose, Bld: 152 mg/dL — ABNORMAL HIGH (ref 65–99)
Potassium: 3.5 mmol/L (ref 3.5–5.1)
SODIUM: 139 mmol/L (ref 135–145)

## 2016-05-13 LAB — PROTIME-INR
INR: 1.13 (ref 0.00–1.49)
PROTHROMBIN TIME: 14.7 s (ref 11.6–15.2)

## 2016-05-13 LAB — I-STAT CG4 LACTIC ACID, ED
Lactic Acid, Venous: 2.31 mmol/L (ref 0.5–2.0)
Lactic Acid, Venous: 2.01 mmol/L (ref 0.5–2.0)

## 2016-05-13 MED ORDER — ONDANSETRON HCL 4 MG/2ML IJ SOLN
4.0000 mg | Freq: Once | INTRAMUSCULAR | Status: AC
  Administered 2016-05-13: 4 mg via INTRAVENOUS
  Filled 2016-05-13: qty 2

## 2016-05-13 MED ORDER — VANCOMYCIN HCL 10 G IV SOLR
1500.0000 mg | Freq: Once | INTRAVENOUS | Status: AC
  Administered 2016-05-13: 1500 mg via INTRAVENOUS
  Filled 2016-05-13: qty 1500

## 2016-05-13 MED ORDER — HEPARIN SODIUM (PORCINE) 5000 UNIT/ML IJ SOLN: 60.0000 [IU]/kg | Freq: Once | INTRAMUSCULAR | Status: DC

## 2016-05-13 MED ORDER — FENTANYL CITRATE (PF) 100 MCG/2ML IJ SOLN
25.0000 ug | Freq: Once | INTRAMUSCULAR | Status: AC
  Administered 2016-05-13: 25 ug via INTRAVENOUS
  Filled 2016-05-13: qty 2

## 2016-05-13 MED ORDER — FENTANYL CITRATE (PF) 100 MCG/2ML IJ SOLN
50.0000 ug | Freq: Once | INTRAMUSCULAR | Status: AC
  Administered 2016-05-13: 50 ug via INTRAVENOUS
  Filled 2016-05-13: qty 2

## 2016-05-13 MED ORDER — ASPIRIN 81 MG PO CHEW
324.0000 mg | CHEWABLE_TABLET | Freq: Once | ORAL | Status: AC
  Administered 2016-05-14: 324 mg via ORAL
  Filled 2016-05-13: qty 4

## 2016-05-13 NOTE — ED Notes (Signed)
Pt transported to xray 

## 2016-05-13 NOTE — ED Notes (Signed)
cardiologist at bedside

## 2016-05-13 NOTE — ED Notes (Signed)
Family at bedside. 

## 2016-05-13 NOTE — ED Provider Notes (Signed)
CSN: [id number]     Arrival date & time 05/13/16  1854 History   First MD Initiated Contact with Patient 05/13/16 1908     Chief Complaint  Patient presents with  . Chest Pain    HPI Comments: 82 year old male with a hx of sick sinus syndrome s/p multiple pacemakers, most recently replaced 3 days ago at the [facility], CVA, HLD, HTN, thoracic AAA, AAA presents to the ED with pain, swelling, redness of the pacemaker insertion site since this morning. Gradually worsening. Also has a left sided chest pain that does not seem to be related to the pacemaker site pain. Nauseated.   Patient is a 82 y.o. male presenting with general illness. The history is provided by the patient and a relative (daughters at bedside).  Illness Location:  Left chest wall - new pacemaker site Quality:  Painful, red, swollen Severity:  Severe Onset quality:  Gradual Duration:  1 day Timing:  Constant Progression:  Worsening Chronicity:  New Context:  Pacemaker replaced 4 days ago Relieved by:  None tried Worsened by:  None tried Ineffective treatments:  None tried Associated symptoms: chest pain and rash   Associated symptoms: no abdominal pain, no congestion, no fever, no nausea, no shortness of breath and no vomiting     Past Medical History  Diagnosis Date  . Hypertension   . CAD (coronary artery disease)     a. s/p MI in 1988;  b. s/p CABG in 2000 (Dr. Barry Dienes) x 4, LIMA to LAD, SVG to diagonal, SVG to [facility], SVG to PDA;  c. 12/2013 NSTEMI/abnl MV;  d. 01/2014 Cath/PCI: native 3VD, VG->RCA 100, VG->OM aneurysmal 40ost/m, 69m(5.0x12 Veriflex BMS), VG->Diag aneurysmal, LIMA->LAD nl.  . TIA (transient ischemic attack)   . Hyperlipidemia   . COPD (chronic obstructive pulmonary disease) (HCC)   . CVA (cerebral infarction) 07/2011    Remote left brain infarct with sensory deficits and had a left internal carotid stent placed by Dr. [name] at that time, no recurrent CVA or TIAs or CNS events.  Marland Kitchen PAD (peripheral  artery disease) (HCC)     Stable. Had a past RFPBG by Dr. [name] in 2009. this graft is occluded with reconstitution in the popliteal artery, which was patent. Right tibial was occluded and 2-vessel runoff on angiography done by [name] in 12/2010.  . Sick sinus syndrome (HCC)     PPM implanted for this and syncope   . Chronic systolic CHF (congestive heart failure) (HCC)     Hospitalization for CHF from 05/04/13-05/06/13. Treated medically. Had possible pneumonia with short course of antibiotics.  . Abdominal bruit     Loud  . AAA (abdominal aortic aneurysm) (HCC)     Has a known AAA of 3.2 x 3.4 cm by abdominal untrasound 03/27/12. Followup abdominal ultrasound on 03/29/13 showed a diameter of 3.6 cm, which is stable  . Peripheral vascular disease (HCC)   . Hypothyroid     On supplement "don't know if I take RX or not for my thyroid" (01/16/2014)  . Stroke (HCC)   . Syncope     tilt table test/8.10.01/orthostatic hypotension  . Orthostatic hypotension   . Presence of permanent cardiac pacemaker   . Pneumonia   . Headache    Past Surgical History  Procedure Laterality Date  . Femoral bypass Right ?2001  . Carotid stent Left 06/23/11    By [name]  . Coronary artery bypass graft  2000    1st in 2000 (  Dr. Barry Dienes) x 4, LIMA to LAD, SVG to diagonal, SVG to OM4, SVG to PDA. Re-cath in May 2012 with patent LIMA to LAD, patent SVG to diagonal, patent SVG to OM with left-to-right collaterals to an occluded right.  . Coronary angioplasty with stent placement  2001-01/16/2014    "think today makes #4" (01/16/2014)  . Cardiac catheterization  2000  . Inguinal hernia repair Right 1980's  . Insert / replace / remove pacemaker      implanted 2003 by Dr Jenne Campus with gen change (MDT [id number]) [facility]  . Cataract extraction w/ intraocular lens  implant, bilateral Bilateral   . Abdominal aortagram N/A 02/22/2012    Procedure: ABDOMINAL AORTAGRAM;  Surgeon: Nada Libman, MD;  Location: [facility] CATH  LAB;  Service: Cardiovascular;  Laterality: N/A;  . Carotid angiogram Bilateral 02/22/2012    Procedure: CAROTID ANGIOGRAM;  Surgeon: Nada Libman, MD;  Location: [facility] CATH LAB;  Service: Cardiovascular;  Laterality: Bilateral;  . Left heart catheterization with coronary/graft angiogram N/A 01/16/2014    Procedure: LEFT HEART CATHETERIZATION WITH Isabel Caprice;  Surgeon: Kathleene Hazel, MD;  Location: [facility] CATH LAB;  Service: Cardiovascular;  Laterality: N/A;  . Abdominal aortagram    . Colonoscopy    . Peripheral vascular catheterization N/A 06/24/2015    Procedure: Abdominal Aortogram;  Surgeon: Nada Libman, MD;  Location: [facility] INVASIVE CV LAB;  Service: Cardiovascular;  Laterality: N/A;  . Endarterectomy femoral Right 06/25/2015    Procedure: ENDARTERECTOMY RIGHT FEMORAL ARTERY ;  Surgeon: Larina Earthly, MD;  Location: [facility] OR;  Service: Vascular;  Laterality: Right;  . Patch angioplasty Right 06/25/2015    Procedure: RIGHT COMMON FEMORAL ARTERY AND PROFUNDA PATCH ANGIOPLASTY USING HEMASHIELD PLATINUM FINESSE PATCH ;  Surgeon: Larina Earthly, MD;  Location[facility] OR;  Service: Vascular;  Laterality: Right;   Family History  Problem Relation Age of Onset  . Cancer Brother 60    oral   . Prostate cancer Brother   . Hypertension Brother   . Hyperlipidemia Brother   . Lung disease Father     black lung disease   Social History  Substance Use Topics  . Smoking status: Former Smoker -- 0.50 packs/day for 68 years    Types: Cigarettes    Quit date: 05/02/2015  . Smokeless tobacco: Never Used  . Alcohol Use: No    Review of Systems  Constitutional: Positive for chills. Negative for fever.  HENT: Negative for congestion.   Eyes: Negative for redness.  Respiratory: Negative for shortness of breath.   Cardiovascular: Positive for chest pain.  Gastrointestinal: Negative for nausea, vomiting and abdominal pain.  Genitourinary: Positive for flank pain.  Musculoskeletal: Negative  for gait problem.  Skin: Positive for rash and wound.  Neurological: Negative for speech difficulty.  Psychiatric/Behavioral: Negative for confusion.    Allergies  Pravachol; Pravastatin; Simvastatin; Other; Oxycodone; and Terazosin  Home Medications   Prior to Admission medications   Medication Sig Start Date End Date Taking? Authorizing Provider  acetaminophen (TYLENOL) 325 MG tablet Take 650 mg by mouth every 6 (six) hours as needed. Reported on 11/25/2015    Historical Provider, MD  amLODipine (NORVASC) 10 MG tablet Take 1 tablet (10 mg total) by mouth daily. 03/15/16   Azalee Course, PA  aspirin 81 MG tablet Take 81 mg by mouth daily.    Historical Provider, MD  atorvastatin (LIPITOR) 40 MG tablet Take 1 tablet (40 mg total) by mouth daily at 6 PM.  05/14/15   Brittainy Sherlynn CarbonM Simmons, PA-C  clopidogrel (PLAVIX) 75 MG tablet Take 1 tablet (75 mg total) by mouth at bedtime. 03/16/16   Lars MassonKatarina H Nelson, MD  furosemide (LASIX) 20 MG tablet Take 1 tablet (20 mg total) by mouth daily. 02/26/16   Lars MassonKatarina H Nelson, MD  hydrALAZINE (APRESOLINE) 100 MG tablet Take 1 tablet (100 mg total) by mouth every 8 (eight) hours. 03/15/16   Azalee CourseHao Meng, PA  isosorbide mononitrate (IMDUR) 60 MG 24 hr tablet Take 1.5 tablets (90 mg total) by mouth daily. 03/15/16   Azalee CourseHao Meng, PA  metoprolol succinate (TOPROL-XL) 100 MG 24 hr tablet Take 100 mg by mouth daily. Take with or immediately following a meal.    Historical Provider, MD  nitroGLYCERIN (NITROSTAT) 0.4 MG SL tablet Place 1 tablet (0.4 mg total) under the tongue every 5 (five) minutes as needed. For chest pain 10/27/15   Lars MassonKatarina H Nelson, MD  potassium chloride (K-DUR) 10 MEQ tablet Take 1 tablet (10 mEq total) by mouth daily. 02/26/16   Lars MassonKatarina H Nelson, MD  Tamsulosin HCl (FLOMAX) 0.4 MG CAPS Take 0.4 mg by mouth daily after supper.     Historical Provider, MD   BP 209/97 mmHg  Pulse 99  Temp(Src) 97.8 F (36.6 C) (Oral)  Resp 15  Ht 5\' 11"  (1.803 m)  Wt 60.782  kg  BMI 18.70 kg/m2 Physical Exam  Constitutional: He is oriented to person, place, and time. He appears well-developed and well-nourished. No distress.  Intermittently having chills  HENT:  Head: Normocephalic and atraumatic.  Right Ear: External ear normal.  Left Ear: External ear normal.  Neck: Normal range of motion.  Cardiovascular: Normal rate and regular rhythm.   Pulses:      Radial pulses are 2+ on the right side, and 2+ on the left side.       Dorsalis pedis pulses are 2+ on the right side, and 2+ on the left side.  Pulmonary/Chest: Effort normal and breath sounds normal. No respiratory distress. He has no wheezes.    Abdominal: Soft. He exhibits no distension. There is no tenderness.  Neurological: He is oriented to person, place, and time.  Skin: Skin is warm and dry. He is not diaphoretic. No pallor.  Psychiatric: He has a normal mood and affect.  Vitals reviewed.   ED Course  Procedures  Labs Review Labs Reviewed  BASIC METABOLIC PANEL - Abnormal; Notable for the following:    Glucose, Bld 152 (*)    Creatinine, Ser 1.57 (*)    GFR calc non Af Amer 38 (*)    GFR calc Af Amer 44 (*)    All other components within normal limits  CBC - Abnormal; Notable for the following:    WBC 13.2 (*)    Platelets 127 (*)    All other components within normal limits  DIFFERENTIAL - Abnormal; Notable for the following:    Neutro Abs 7.9 (*)    Lymphs Abs 4.4 (*)    All other components within normal limits  I-STAT TROPOININ, ED - Abnormal; Notable for the following:    Troponin i, poc 0.16 (*)    All other components within normal limits  I-STAT CG4 LACTIC ACID, ED - Abnormal; Notable for the following:    Lactic Acid, Venous 2.31 (*)    All other components within normal limits  I-STAT CG4 LACTIC ACID, ED - Abnormal; Notable for the following:    Lactic Acid, Venous 2.01 (*)  All other components within normal limits  I-STAT TROPOININ, ED - Abnormal; Notable for the  following:    Troponin i, poc 0.20 (*)    All other components within normal limits  CULTURE, BLOOD (ROUTINE X 2)  CULTURE, BLOOD (ROUTINE X 2)  PROTIME-INR  HEPARIN LEVEL (UNFRACTIONATED)    Imaging Review Dg Chest 2 View  05/13/2016  CLINICAL DATA:  Chest pain for 1 day. EXAM: CHEST  2 VIEW COMPARISON:  March 11, 2016 chest x-ray FINDINGS: No change in the heart, hila, or mediastinum. The patient's known descending thoracic aortic aneurysm is again identified. No pneumothorax. A left-sided pacemaker is seen. Mild pulmonary venous congestion/mild edema is again identified. No other acute abnormalities. IMPRESSION: Mild pulmonary venous congestion/mild edema.  Cardiomegaly. Electronically Signed   By: Gerome Sam III M.D   On: 05/13/2016 19:56   Ct Angio Chest/abd/pel For Dissection W And/or W/wo  05/14/2016  CLINICAL DATA:  Chest wall swelling. Recent pacer insertion with reported clinical signs of infection. EXAM: CT ANGIOGRAPHY CHEST, ABDOMEN AND PELVIS TECHNIQUE: Multidetector CT imaging through the chest, abdomen and pelvis was performed using the standard protocol during bolus administration of intravenous contrast. Multiplanar reconstructed images and MIPs were obtained and reviewed to evaluate the vascular anatomy. CONTRAST:  100 cc Isovue 370 intravenous COMPARISON:  Chest CT 03/11/2016.  Abdominal CT 01/28/2016. FINDINGS: CTA CHEST FINDINGS THORACIC INLET/BODY WALL: Nonspecific stranding around a pacer battery pack in the left chest. There is associated soft tissue gas which could be from recent placement. No discrete fluid collection is seen. MEDIASTINUM: Chronic cardiomegaly. No pericardial effusion. Unremarkable positioning of dual-chamber pacer leads from the left with 2 right ventricular leads. The patient is status post CABG and aortic root replacement. Atherosclerosis is severe. The descending aortic segment is diffusely aneurysmal, measuring up to 55 mm distally. Extensive irregular  mural thrombus is present. No evidence of acute intramural hematoma or dissection. Stable patency of the great vessels. There is bilateral vertebral artery origin stenosis. Mild to moderate left common carotid artery origin stenosis. No pulmonary artery opacification. LUNG WINDOWS: Paraseptal and centrilobular emphysema. Bibasilar reticulation at least partially atelectasis. Small pleural effusions, decreased from prior. OSSEOUS: Degenerative changes without acute finding. Review of the MIP images confirms the above findings. CTA ABDOMEN AND PELVIS FINDINGS Abdominal wall:  Suspect previous inguinal hernia repair. Hepatobiliary: 26 mm right hepatic cyst.No evidence of biliary obstruction or stone. Pancreas: Unremarkable. Spleen: Unremarkable. Adrenals/Urinary Tract: Negative adrenals. Patchy heterogeneous enhancement of the bilateral kidneys with extensive scarring and thinning. No hydronephrosis. Unremarkable bladder. Reproductive:No pathologic findings. Stomach/Bowel:  No obstruction. No appendicitis. Vascular/Lymphatic: Duplicated right renal arteries. Replaced hepatic artery to the SMA. The abdominal aorta severely atheromatous. Just below the hiatus are too posterior wall ulcerations, larger and more midline measuring 18 mm in diameter and 8 mm in depth. These predominantly erode through mural thrombus, but approach the adventitia. There is no new bulging or periaortic inflammation at this level. Roughly bilobed infrarenal fusiform aortic aneurysm measuring up to 43 mm diameter near the bifurcation. Aneurysmal bilateral iliac arteries, with left common iliac artery measuring 44 mm in diameter (702:67) and right common iliac artery measuring 24 mm (702:63). Aneurysmal appearance of both hypogastric arteries. 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 Left hypogastric artery is occluded. Left superficial femoral artery occlusion,  chronic. Right femoral bypass, superficial femoral artery and profunda occlusion - chronic. No acute visceral artery occlusion. Celiac origin stenosis  is high-grade. Diffusely atheromatous but patent SMA. Patent IMA. Peritoneal: No ascites or pneumoperitoneum. Musculoskeletal: Severe facet arthropathy. Chronic pars defect at L5 on the left. These results were called by telephone at the time of interpretation on 05/14/2016 at 1:39 am to Dr. Maxine Glenn , who verbally acknowledged these results. Review of the MIP images confirms the above findings. IMPRESSION: 1. Unexpected stranding around the left pacer pocket correlating with history of clinical infection. No evidence of fluid collection. 2. Since CT 2 months ago, 2 new posterior wall aortic ulcers just below the level of the hiatus. These predominately excavate chronic mural thrombus but there is also extension into the wall, nearly reaching the adventitia. 3. Descending, abdominal, and bilateral iliac aneurysms as measured above. No growth since chest imaging 2 months prior and abdominal imaging February 2017. 4. Chronic left SFA occlusion. 5. Chronic right femoral bypass, SFA, and profunda occlusion. Electronically Signed   By: Marnee Spring M.D.   On: 05/14/2016 01:49   I have personally reviewed and evaluated these images and lab results as part of my medical decision-making.   EKG Interpretation   Date/Time:  Thursday May 13 2016 19:00:47 EDT Ventricular Rate:  100 PR Interval:    QRS Duration: 110 QT Interval:  359 QTC Calculation: 463 R Axis:   -61 Text Interpretation:  Sinus rhythm Premature atrial complexes Paired  ventricular premature complexes LAD, consider left anterior fascicular  block RSR' in V1 or V2, probably normal variant LVH with secondary  repolarization abnormality When compared with ECG of 03/14/2016, T wave  inversion T wave inversion less evident in Anterolateral leads Premature  ventricular complexes are now Present  Confirmed by [facility]  MD, DAVID  ([id number]) on 05/13/2016 7:10:19 PM      MDM   Final diagnoses:  Cellulitis of chest wall  Pacemaker infection, initial encounter (HCC)  Elevated troponin    82 y.o. male with a recent pacemaker insertion at the [facility] presents to the ED with left-sided chest pain and pacemaker site pain. Remainder of history of present illness, review of systems, exam as above. Exam notable for cellulitis of the pacemaker site. The area is erythematous, swollen, warm to the touch, very tender to touch.  He was started on vancomycin. Blood cultures were obtained prior to antibiotic administration. CBC notable for a leukocytosis. BMP notable for chronic kidney disease. Initial lactic acid elevated at 2.31. It did come down to 2.01. Initial troponin elevated at 0.16. Repeat increased to 0.2. He has a history of a descending thoracic aortic aneurysm with maximum dimensions of 5.6 cm noted on April 6.  Given his associated left-sided chest pain with his known descending thoracic aortic aneurysm, will obtain a CTA to rule out dissection. Gave aspirin for his elevated troponin, however will not heparinize him until CTA is done and resulted.  I discussed this case with cardiology. They evaluated him in the ED and determined that since his pacemaker is so fresh, they would recommend transfer to the [facility] given that it was placed there 3 days ago.  The [facility] was contacted and information was faxed to them. CTA and repeat troponin is pending at the time of this fax.  12:26 AM  Spoke with Dr. Chales Abrahams, [facility] ER, who will be reviewing the case to determine if the patient will be accepted in transfer; she stated that the case would be reviewed likely within the next 15 minutes  1:40 AM Spoke with the radiologist, the patient has  a new penetrating ulcer near the aortic hiatus - not ruptured; will need to be seen by vascular surgery  Discussed with Dr. Chales Abrahams, [facility] ED in [place]. Conveyed lab  and imaging results. Dr. Chales Abrahams accepted the patient in transfer to the ED at the [facility].   I discussed all findings with the patient. He expressed that he is DNR/DNI. His daughters were at the bedside during this conversation and verbalized that he has expressed these wishes before.   Case managed in conjunction with my attending, Dr. Preston Fleeting.     Maxine Glenn, MD 05/14/16 1610  Dione Booze, MD 05/14/16 [phone]

## 2016-05-13 NOTE — Evaluation (Signed)
I was called to assess the patient after he presented to the ED with pain at his pocket site as well as a sharp pain in the left lower chest/LUQ.  He is post-procedure day 4 from PPM lead revision, and my examination of the pocket site demonstrated edema with warmth to palpation but no fluctuance.  He has a leukocytosis with WBC=13.2.  I agreed with the ED physician in planning for BCx and starting empiric antibiotic therapy with Vancomycin IV dosed per GFR.  His separate left lower chest pain is atypical in character, described as sharp, and is not frankly pleuritic.  ECG is notable for intermittent PVCs but no ST or T wave changes indicative of ischemia, and troponin is mildly elevated at 0.16.  His femoral, popliteal, DP, and PT pulses are symmetric bilaterally.  I discussed the case with the on-[facility], and given the recency of his procedure the patient will be transferred back to that facility for further care.  He will be continued on his ASA and clopidogrel, but heparin gtt will be deferred given lower clinical suspicion for an acute plaque rupture event or in-stent thrombosis as the etiology of his atypical CP.  Azalee CoursePaul S. Bristyl Mclees MD

## 2016-05-13 NOTE — ED Notes (Signed)
Pt recently had medtronic pacemaker placed on Monday and today started having a sharp pain on the left side of his chest. Incision site is red and warm to touch.

## 2016-05-14 ENCOUNTER — Encounter (HOSPITAL_COMMUNITY): Payer: Self-pay | Admitting: Radiology

## 2016-05-14 ENCOUNTER — Emergency Department (HOSPITAL_COMMUNITY): Payer: Non-veteran care

## 2016-05-14 DIAGNOSIS — R079 Chest pain, unspecified: Secondary | ICD-10-CM | POA: Diagnosis present

## 2016-05-14 DIAGNOSIS — I739 Peripheral vascular disease, unspecified: Secondary | ICD-10-CM | POA: Diagnosis not present

## 2016-05-14 DIAGNOSIS — Z8673 Personal history of transient ischemic attack (TIA), and cerebral infarction without residual deficits: Secondary | ICD-10-CM | POA: Diagnosis not present

## 2016-05-14 DIAGNOSIS — Z8639 Personal history of other endocrine, nutritional and metabolic disease: Secondary | ICD-10-CM | POA: Diagnosis not present

## 2016-05-14 DIAGNOSIS — T827XXA Infection and inflammatory reaction due to other cardiac and vascular devices, implants and grafts, initial encounter: Secondary | ICD-10-CM | POA: Diagnosis not present

## 2016-05-14 DIAGNOSIS — L03313 Cellulitis of chest wall: Secondary | ICD-10-CM | POA: Diagnosis not present

## 2016-05-14 DIAGNOSIS — Y658 Other specified misadventures during surgical and medical care: Secondary | ICD-10-CM | POA: Diagnosis not present

## 2016-05-14 DIAGNOSIS — Z8701 Personal history of pneumonia (recurrent): Secondary | ICD-10-CM | POA: Diagnosis not present

## 2016-05-14 DIAGNOSIS — Z87891 Personal history of nicotine dependence: Secondary | ICD-10-CM | POA: Diagnosis not present

## 2016-05-14 DIAGNOSIS — E785 Hyperlipidemia, unspecified: Secondary | ICD-10-CM | POA: Diagnosis not present

## 2016-05-14 DIAGNOSIS — R7989 Other specified abnormal findings of blood chemistry: Secondary | ICD-10-CM | POA: Diagnosis not present

## 2016-05-14 DIAGNOSIS — Z7902 Long term (current) use of antithrombotics/antiplatelets: Secondary | ICD-10-CM | POA: Diagnosis not present

## 2016-05-14 DIAGNOSIS — I5022 Chronic systolic (congestive) heart failure: Secondary | ICD-10-CM | POA: Diagnosis not present

## 2016-05-14 DIAGNOSIS — I10 Essential (primary) hypertension: Secondary | ICD-10-CM | POA: Diagnosis not present

## 2016-05-14 DIAGNOSIS — Z79899 Other long term (current) drug therapy: Secondary | ICD-10-CM | POA: Diagnosis not present

## 2016-05-14 DIAGNOSIS — J449 Chronic obstructive pulmonary disease, unspecified: Secondary | ICD-10-CM | POA: Diagnosis not present

## 2016-05-14 MED ORDER — HEPARIN BOLUS VIA INFUSION
3000.0000 [IU] | Freq: Once | INTRAVENOUS | Status: AC
  Administered 2016-05-14: 3000 [IU] via INTRAVENOUS
  Filled 2016-05-14: qty 3000

## 2016-05-14 MED ORDER — IOPAMIDOL (ISOVUE-370) INJECTION 76%
INTRAVENOUS | Status: AC
  Administered 2016-05-14: 100 mL
  Filled 2016-05-14: qty 100

## 2016-05-14 MED ORDER — HEPARIN (PORCINE) IN NACL 100-0.45 UNIT/ML-% IJ SOLN
700.0000 [IU]/h | INTRAMUSCULAR | Status: DC
  Administered 2016-05-14: 700 [IU]/h via INTRAVENOUS
  Filled 2016-05-14: qty 250

## 2016-05-14 MED ORDER — NITROGLYCERIN 2 % TD OINT
1.0000 [in_us] | TOPICAL_OINTMENT | Freq: Once | TRANSDERMAL | Status: AC
  Administered 2016-05-14: 1 [in_us] via TOPICAL
  Filled 2016-05-14: qty 1

## 2016-05-14 NOTE — Progress Notes (Signed)
ANTICOAGULATION CONSULT NOTE - Initial Consult  Pharmacy Consult for Heparin  Indication: chest pain/ACS  Patient Measurements: Height: 5\' 11"  (180.3 cm) Weight: 134 lb (60.782 kg) IBW/kg (Calculated) : 75.3 Vital Signs: Temp: 98.7 F (37.1 C) (06/08 2059) Temp Source: Rectal (06/08 2059) BP: 176/90 mmHg (06/09 0130) Pulse Rate: 69 (06/09 0130)  Labs:  Recent Labs  05/13/16 1905  HGB 14.6  HCT 43.5  PLT 127*  LABPROT 14.7  INR 1.13  CREATININE 1.57*    Estimated Creatinine Clearance: 28.5 mL/min (by C-G formula based on Cr of 1.57).   Medical History: Past Medical History  Diagnosis Date  . Hypertension   . CAD (coronary artery disease)     a. s/p MI in 1988;  b. s/p CABG in 2000 ([name]) x 4, LIMA to LAD, SVG to diagonal, [facility], SVG to PDA;  c. 12/2013 NSTEMI/abnl MV;  d. 01/2014 Cath/PCI: native 3VD, VG->RCA 100, VG->OM aneurysmal 40ost/m, 3454m(5.0x12 Veriflex BMS), VG->Diag aneurysmal, LIMA->LAD nl.  . TIA (transient ischemic attack)   . Hyperlipidemia   . COPD (chronic obstructive pulmonary disease) (HCC)   . CVA (cerebral infarction) 07/2011    Remote left brain infarct with sensory deficits and had a left internal carotid stent placed by Dr. Corliss Skainseveshwar at that time, no recurrent CVA or TIAs or CNS events.  Marland Kitchen. PAD (peripheral artery disease) (HCC)     Stable. Had a past RFPBG by Dr. Arbie CookeyEarly in 2009. this graft is occluded with reconstitution in the popliteal artery, which was patent. Right tibial was occluded and 2-vessel runoff on angiography done by Dr. Myra GianottiBrabham in 12/2010.  . Sick sinus syndrome (HCC)     PPM implanted for this and syncope   . Chronic systolic CHF (congestive heart failure) (HCC)     Hospitalization for CHF from 05/04/13-05/06/13. Treated medically. Had possible pneumonia with short course of antibiotics.  . Abdominal bruit     Loud  . AAA (abdominal aortic aneurysm) (HCC)     Has a known AAA of 3.2 x 3.4 cm by abdominal untrasound 03/27/12.  Followup abdominal ultrasound on 03/29/13 showed a diameter of 3.6 cm, which is stable  . Peripheral vascular disease (HCC)   . Hypothyroid     On supplement "don't know if I take RX or not for my thyroid" (01/16/2014)  . Stroke (HCC)   . Syncope     tilt table test/8.10.01/orthostatic hypotension  . Orthostatic hypotension   . Presence of permanent cardiac pacemaker   . Pneumonia   . Headache      Assessment: 82 y/o M with recent PPM lead revision, having some atypical CP, [facility], starting heparin per pharmacy. Hgb good, plts slightly low, noted renal dysfunction.   Goal of Therapy:  Heparin level 0.3-0.7 units/ml Monitor platelets by anticoagulation protocol: Yes   Plan:  -Heparin 3000 units BOLUS -Start heparin drip at 700 units/hr -1100 HL  Abran DukeLedford, Shyleigh Daughtry 05/14/2016,2:06 AM

## 2016-05-18 LAB — CULTURE, BLOOD (ROUTINE X 2)
CULTURE: NO GROWTH
CULTURE: NO GROWTH

## 2016-06-24 ENCOUNTER — Encounter: Payer: Self-pay | Admitting: Family

## 2016-06-29 ENCOUNTER — Ambulatory Visit (INDEPENDENT_AMBULATORY_CARE_PROVIDER_SITE_OTHER): Payer: Medicare Other | Admitting: Family

## 2016-06-29 ENCOUNTER — Encounter: Payer: Self-pay | Admitting: Family

## 2016-06-29 ENCOUNTER — Ambulatory Visit (HOSPITAL_COMMUNITY)
Admission: RE | Admit: 2016-06-29 | Discharge: 2016-06-29 | Disposition: A | Discharge status: Home / Self Care | Payer: Medicare Other | Source: Ambulatory Visit | Attending: Family | Admitting: Family

## 2016-06-29 VITALS — BP 133/84 | HR 88 | Temp 98.0°F | Ht 71.0 in | Wt 135.5 lb

## 2016-06-29 DIAGNOSIS — I110 Hypertensive heart disease with heart failure: Secondary | ICD-10-CM | POA: Insufficient documentation

## 2016-06-29 DIAGNOSIS — I2510 Atherosclerotic heart disease of native coronary artery without angina pectoris: Secondary | ICD-10-CM | POA: Insufficient documentation

## 2016-06-29 DIAGNOSIS — R0989 Other specified symptoms and signs involving the circulatory and respiratory systems: Secondary | ICD-10-CM | POA: Diagnosis present

## 2016-06-29 DIAGNOSIS — R9439 Abnormal result of other cardiovascular function study: Secondary | ICD-10-CM | POA: Diagnosis not present

## 2016-06-29 DIAGNOSIS — I779 Disorder of arteries and arterioles, unspecified: Secondary | ICD-10-CM | POA: Diagnosis not present

## 2016-06-29 DIAGNOSIS — E785 Hyperlipidemia, unspecified: Secondary | ICD-10-CM | POA: Insufficient documentation

## 2016-06-29 DIAGNOSIS — J449 Chronic obstructive pulmonary disease, unspecified: Secondary | ICD-10-CM | POA: Diagnosis not present

## 2016-06-29 DIAGNOSIS — Z9889 Other specified postprocedural states: Secondary | ICD-10-CM

## 2016-06-29 DIAGNOSIS — F17200 Nicotine dependence, unspecified, uncomplicated: Secondary | ICD-10-CM

## 2016-06-29 DIAGNOSIS — E039 Hypothyroidism, unspecified: Secondary | ICD-10-CM | POA: Diagnosis not present

## 2016-06-29 DIAGNOSIS — I739 Peripheral vascular disease, unspecified: Secondary | ICD-10-CM | POA: Insufficient documentation

## 2016-06-29 DIAGNOSIS — I5022 Chronic systolic (congestive) heart failure: Secondary | ICD-10-CM | POA: Diagnosis not present

## 2016-06-29 NOTE — Progress Notes (Signed)
[facility] [place]   CC: Follow up peripheral artery occlusive disease  History of Present Illness [name] is a 82 y.o. male patient of Dr. [name] who is s/p right external common and profundus endarterectomy for critical limb ischemia of his right [name]. Surgery was in July 2016. He had coronary intervention. During his evaluation he had a CT scan which showed increasing size of a known thoracic aneurysm. Maximal diameter was approximately 5.6 cm. Also reviewed CT scan for of evaluation of flank pain in February 2017. This revealed a 3 cm iliac artery aneurysms bilaterally. He is quite active. He reports that he just disassembled a engine from a 1950 mercury and is going to reassemble it is getting a running. His wife of 59 years died in March 31, 2013. He does have children in the area.  Dr. [name] last saw pt in 2017-04-26for discussion of results of CTA chest. At that time did review the actual films from CT of his chest and abdomen and pelvis. Pt would potentially be a candidate for stent graft repair of his thoracic aorta although it does extend over the entire thoracic aorta. Also has moderate size iliac artery aneurysms. Has bilateral pleural effusions as well. At that visit Dr. [name] discussed options at length with the patient. Pt is completely asymptomatic from his aneurysms. Explained significant risk with surgery to correct this and feels that in all likelihood his risk for surgery outweighs his risk for rupture. Explained that if this did rupture even is either his iliac or thoracic aneurysm that would be fatal. Explain the potential risk for paralysis with a thoracic stent graft repair. He is not interested in pursuing surgery. Is quite active and independent currently and wished to stay that way. I certainly agree with this decision. He will see [place] again on an as-needed basis re this.  Pt returns today for continued surveillance of his lower extremity arterial  perfusion.  Pt states he had to have a new pacemaker inserted in 03/31/2016.  He denies claudication symptoms in his legs with walking, denies non healing wounds. His toes feel cold at night, he is active all day.  Pt Diabetic: no Pt smoker: smoker  (1 pp/week started at age 48 yrs)  Pt meds include: Statin :Yes Betablocker: Yes ASA: Yes Other anticoagulants/antiplatelets: Plavix   Past Medical History:  Diagnosis Date  . AAA (abdominal aortic aneurysm) (HCC)    Has a known AAA of 3.2 x 3.4 cm by abdominal untrasound 03/27/12. Followup abdominal ultrasound on 03/31/2013 showed a diameter of 3.6 cm, which is stable  . Abdominal bruit    Loud  . CAD (coronary artery disease)    a. s/p MI in April 01, 1987;  b. s/p CABG in April 01, 1999 (Dr. Barry Dienes) x 4, LIMA to LAD, SVG to diagonal, SVG to [facility], SVG to PDA;  c. 12/2013 NSTEMI/abnl MV;  d. 01/2014 Cath/PCI: native 3VD, VG->RCA 100, VG->OM aneurysmal 40ost/m, 58m(5.0x12 Veriflex BMS), VG->Diag aneurysmal, LIMA->LAD nl.  . Chronic systolic CHF (congestive heart failure) (HCC)    Hospitalization for CHF from 05/04/13-05/06/13. Treated medically. Had possible pneumonia with short course of antibiotics.  Marland Kitchen COPD (chronic obstructive pulmonary disease) (HCC)   . CVA (cerebral infarction) 07/2011   Remote left brain infarct with sensory deficits and had a left internal carotid stent placed by Dr. [name] at that time, no recurrent CVA or TIAs or CNS events.  . Headache   . Hyperlipidemia   . Hypertension   . Hypothyroid  On supplement "don't know if I take RX or not for my thyroid" (01/16/2014)  . Orthostatic hypotension   . PAD (peripheral artery disease) (HCC)    Stable. Had a past RFPBG by Dr. [name] in 2009. this graft is occluded with reconstitution in the popliteal artery, which was patent. Right tibial was occluded and 2-vessel runoff on angiography done by [name] in 12/2010.  Marland Kitchen Peripheral vascular disease (HCC)   . Pneumonia   . Presence of permanent cardiac  pacemaker   . Sick sinus syndrome (HCC)    PPM implanted for this and syncope   . Stroke (HCC)   . Syncope    tilt table test/8.10.01/orthostatic hypotension  . TIA (transient ischemic attack)     Social History Social History  Substance Use Topics  . Smoking status: Former Smoker    Packs/day: 0.50    Years: 68.00    Types: Cigarettes    Quit date: 05/02/2015  . Smokeless tobacco: Never Used  . Alcohol use No    Family History Family History  Problem Relation Age of Onset  . Cancer Brother 60    oral   . Prostate cancer Brother   . Hypertension Brother   . Hyperlipidemia Brother   . Lung disease Father     black lung disease    Past Surgical History:  Procedure Laterality Date  . ABDOMINAL AORTAGRAM N/A 02/22/2012   Procedure: ABDOMINAL Ronny Flurry;  Surgeon: Nada Libman, MD;  Location: [facility] CATH LAB;  Service: Cardiovascular;  Laterality: N/A;  . abdominal aortagram    . CARDIAC CATHETERIZATION  2000  . CAROTID ANGIOGRAM Bilateral 02/22/2012   Procedure: CAROTID ANGIOGRAM;  Surgeon: Nada Libman, MD;  Location: [facility] CATH LAB;  Service: Cardiovascular;  Laterality: Bilateral;  . CAROTID STENT Left 06/23/11   By [name]  . CATARACT EXTRACTION W/ INTRAOCULAR LENS  IMPLANT, BILATERAL Bilateral   . COLONOSCOPY    . CORONARY ANGIOPLASTY WITH STENT PLACEMENT  2001-01/16/2014   "think today makes #4" (01/16/2014)  . CORONARY ARTERY BYPASS GRAFT  2000   1st in 2000 (Dr. Barry Dienes) x 4, LIMA to LAD, SVG to diagonal, SVG to OM4, SVG to PDA. Re-cath in May 2012 with patent LIMA to LAD, patent SVG to diagonal, patent SVG to OM with left-to-right collaterals to an occluded right.  Marland Kitchen ENDARTERECTOMY FEMORAL Right 06/25/2015   Procedure: ENDARTERECTOMY RIGHT FEMORAL ARTERY ;  Surgeon: Larina Earthly, MD;  Location: [facility] OR;  Service: Vascular;  Laterality: Right;  . FEMORAL BYPASS Right ?2001  . INGUINAL HERNIA REPAIR Right 1980's  . INSERT / REPLACE / REMOVE PACEMAKER     implanted 2003  by Dr Jenne Campus with gen change (MDT ADDRL1) 08/2010 by [facility]  . LEFT HEART CATHETERIZATION WITH CORONARY/GRAFT ANGIOGRAM N/A 01/16/2014   Procedure: LEFT HEART CATHETERIZATION WITH Isabel Caprice;  Surgeon: Kathleene Hazel, MD;  Location: [facility] C[facility] LAB;  Service: Cardiovascular;  Laterality: N/A;  . PATCH ANGIOPLASTY Right 06/25/2015   Procedure: RIGHT COMMON FEMORAL ARTERY AND PROFUNDA PATCH ANGIOPLASTY USING HEMASHIELD PLATINUM FINESSE PATCH ;  Surgeon: Larina Earthly, MD;  Location: [facility] OR;  Service: Vascular;  Laterality: Right;  . PERIPHERAL VASCULAR CATHETERIZATION N/A 06/24/2015   Procedure: Abdominal Aortogram;  Surgeon: Nada Libman, MD;  Location: [facility] INVASIVE CV LAB;  Service: Cardiovascular;  Laterality: N/A;    Allergies  Allergen Reactions  . Pravachol Other (See Comments)    myalgias  . Pravastatin Other (See Comments)  myalgias  . Simvastatin Other (See Comments)    myalgia  . Other Other (See Comments)    Other reaction(s): Other (See Comments) GREEN BEANS AND ONIONS GREEN BEANS AND ONIONS  . Oxycodone Other (See Comments)    Family member reports "he was highly agitated and disoriented"   . Terazosin Other (See Comments)    unknown    Current Outpatient Prescriptions  Medication Sig Dispense Refill  . acetaminophen (TYLENOL) 325 MG tablet Take 650 mg by mouth every 6 (six) hours as needed for mild pain. Reported on 11/25/2015    . amLODipine (NORVASC) 10 MG tablet Take 1 tablet (10 mg total) by mouth daily. 90 tablet 3  . aspirin 81 MG tablet Take 81 mg by mouth daily.    Marland Kitchen atorvastatin (LIPITOR) 40 MG tablet Take 1 tablet (40 mg total) by mouth daily at 6 PM. (Patient taking differently: Take 10 mg by mouth daily at 6 PM. ) 30 tablet 5  . clopidogrel (PLAVIX) 75 MG tablet Take 1 tablet (75 mg total) by mouth at bedtime.    . furosemide (LASIX) 20 MG tablet Take 1 tablet (20 mg total) by mouth daily. 90 tablet 3  . hydrALAZINE (APRESOLINE) 100 MG tablet  Take 1 tablet (100 mg total) by mouth every 8 (eight) hours. (Patient taking differently: Take 100 mg by mouth at bedtime. ) 90 tablet 11  . isosorbide mononitrate (IMDUR) 60 MG 24 hr tablet Take 1.5 tablets (90 mg total) by mouth daily. 45 tablet 5  . metoprolol succinate (TOPROL-XL) 100 MG 24 hr tablet Take 100 mg by mouth daily. Take with or immediately following a meal.    . nitroGLYCERIN (NITROSTAT) 0.4 MG SL tablet Place 1 tablet (0.4 mg total) under the tongue every 5 (five) minutes as needed. For chest pain 25 tablet 3  . potassium chloride (K-DUR) 10 MEQ tablet Take 1 tablet (10 mEq total) by mouth daily. 90 tablet 3  . Tamsulosin HCl (FLOMAX) 0.4 MG CAPS Take 0.4 mg by mouth daily after supper.      No current facility-administered medications for this visit.     ROS: See HPI for pertinent positives and negatives.   Physical Examination  Vitals:   06/29/16 1447  BP: 133/84  Pulse: 88  Temp: 98 F (36.7 C)  Weight: 135 lb 8 oz (61.5 kg)  Height: 5\' 11"  (1.803 m)   Body mass index is 18.9 kg/m.  General: A&O x 3, WDWN, thin, fit appearing male. Gait: normal Eyes: PERRLA, bilateral arcus senilus. Pulmonary: Respirations are non labored, CTAB, without wheezes , rales or rhonchi. Cardiac: regular rhythm, no detected murmur.         Carotid Bruits Right Left   Negative Negative  Aorta is palpable. Radial pulses: 2+ palpable and =                           VASCULAR EXAM: Extremities without ischemic changes, (pt declined to remove his socks and shoes)  no Gangrene; no open wounds.  LE Pulses Right Left       FEMORAL  2+ palpable  3+ palpable        POPLITEAL  not palpable   not palpable       POSTERIOR TIBIAL  not palpable   not palpable        DORSALIS PEDIS      ANTERIOR TIBIAL not palpable  not palpable    Abdomen: soft, NT, no palpable masses. Skin:  no rashes, no ulcers Musculoskeletal: no muscle wasting or atrophy.  Neurologic: A&O X 3; Appropriate Affect ; SENSATION: normal; MOTOR FUNCTION:  moving all extremities equally, motor strength 5/5 throughout. Speech is fluent/normal. CN 2-12 intact.    Non-Invasive Vascular Imaging: DATE: 06/29/2016 ABI:  RIGHT: 0.71 (0.62, 11/25/15), Waveforms: monophasic; TBI: 0.47 (0.56)   LEFT: 0.68 (0.63), Waveforms: monophasic, TBI: 0.45 (0.63)   ASSESSMENT: Demitrius Crass is a 82 y.o. male who is s/p right external common and profundus endarterectomy in July 2016 for critical limb ischemia of his right [name].  He walks a great deal with no claudication symptoms. He denies non healing wounds in his feet/legs.  ABI's today remain stable compared to 7 months ago with moderate arterial occlusive disease bilaterally; waveforms remain monophasic.   Fortunately he does not have DM, but unfortunately he continues to smoke. However, he plans to start nicotine patches within a week and wean himself off of cigarettes.     PLAN:  Based on the patient's vascular studies and examination, pt will return to clinic in 1 year with ABI's. Continue walking a great deal. The patient was counseled re smoking cessation and given several free resources re smoking cessation. He states he plans to start nicotine patches in a few days and wean himself off of cigarettes.  I discussed in depth with the patient the nature of atherosclerosis, and emphasized the importance of maximal medical management including strict control of blood pressure, blood glucose, and lipid levels, obtaining regular exercise, and cessation of smoking.  The patient is aware that without maximal medical management the underlying atherosclerotic disease process will progress, limiting the benefit of any interventions.  The patient was given information about PAD including signs, symptoms, treatment, what symptoms should prompt the patient to seek  immediate medical care, and risk reduction measures to take.  Charisse March, RN, MSN, FNP-C [facility] [place] Phone: [phone]  Clinic MD: Early  06/29/16 2:56 PM

## 2016-06-29 NOTE — Patient Instructions (Signed)
Peripheral Vascular Disease Peripheral vascular disease (PVD) is a disease of the blood vessels that are not part of your heart and brain. A simple term for PVD is poor circulation. In most cases, PVD narrows the blood vessels that carry blood from your heart to the rest of your body. This can result in a decreased supply of blood to your arms, legs, and internal organs, like your stomach or kidneys. However, it most often affects a person's lower legs and feet. There are two types of PVD.  Organic PVD. This is the more common type. It is caused by damage to the structure of blood vessels.  Functional PVD. This is caused by conditions that make blood vessels contract and tighten (spasm). Without treatment, PVD tends to get worse over time. PVD can also lead to acute ischemic limb. This is when an arm or limb suddenly has trouble getting enough blood. This is a medical emergency. CAUSES Each type of PVD has many different causes. The most common cause of PVD is buildup of a fatty material (plaque) inside of your arteries (atherosclerosis). Small amounts of plaque can break off from the walls of the blood vessels and become lodged in a smaller artery. This blocks blood flow and can cause acute ischemic limb. Other common causes of PVD include:  Blood clots that form inside of blood vessels.  Injuries to blood vessels.  Diseases that cause inflammation of blood vessels or cause blood vessel spasms.  Health behaviors and health history that increase your risk of developing PVD. RISK FACTORS  You may have a greater risk of PVD if you:  Have a family history of PVD.  Have certain medical conditions, including:  High cholesterol.  Diabetes.  High blood pressure (hypertension).  Coronary heart disease.  Past problems with blood clots.  Past injury, such as burns or a broken bone. These may have damaged blood vessels in your limbs.  Buerger disease. This is caused by inflamed blood  vessels in your hands and feet.  Some forms of arthritis.  Rare birth defects that affect the arteries in your legs.  Use tobacco.  Do not get enough exercise.  Are obese.  Are age 50 or older. SIGNS AND SYMPTOMS  PVD may cause many different symptoms. Your symptoms depend on what part of your body is not getting enough blood. Some common signs and symptoms include:  Cramps in your lower legs. This may be a symptom of poor leg circulation (claudication).  Pain and weakness in your legs while you are physically active that goes away when you rest (intermittent claudication).  Leg pain when at rest.  Leg numbness, tingling, or weakness.  Coldness in a leg or foot, especially when compared with the other leg.  Skin or hair changes. These can include:  Hair loss.  Shiny skin.  Pale or bluish skin.  Thick toenails.  Inability to get or maintain an erection (erectile dysfunction). People with PVD are more prone to developing ulcers and sores on their toes, feet, or legs. These may take longer than normal to heal. DIAGNOSIS Your health care provider may diagnose PVD from your signs and symptoms. The health care provider will also do a physical exam. You may have tests to find out what is causing your PVD and determine its severity. Tests may include:  Blood pressure recordings from your arms and legs and measurements of the strength of your pulses (pulse volume recordings).  Imaging studies using sound waves to take pictures of   the blood flow through your blood vessels (Doppler ultrasound).  Injecting a dye into your blood vessels before having imaging studies using:  X-rays (angiogram or arteriogram).  Computer-generated X-rays (CT angiogram).  A powerful electromagnetic field and a computer (magnetic resonance angiogram or MRA). TREATMENT Treatment for PVD depends on the cause of your condition and the severity of your symptoms. It also depends on your age. Underlying  causes need to be treated and controlled. These include long-lasting (chronic) conditions, such as diabetes, high cholesterol, and high blood pressure. You may need to first try making lifestyle changes and taking medicines. Surgery may be needed if these do not work. Lifestyle changes may include:  Quitting smoking.  Exercising regularly.  Following a low-fat, low-cholesterol diet. Medicines may include:  Blood thinners to prevent blood clots.  Medicines to improve blood flow.  Medicines to improve your blood cholesterol levels. Surgical procedures may include:  A procedure that uses an inflated balloon to open a blocked artery and improve blood flow (angioplasty).  A procedure to put in a tube (stent) to keep a blocked artery open (stent implant).  Surgery to reroute blood flow around a blocked artery (peripheral bypass surgery).  Surgery to remove dead tissue from an infected wound on the affected limb.  Amputation. This is surgical removal of the affected limb. This may be necessary in cases of acute ischemic limb that are not improved through medical or surgical treatments. HOME CARE INSTRUCTIONS  Take medicines only as directed by your health care provider.  Do not use any tobacco products, including cigarettes, chewing tobacco, or electronic cigarettes. If you need help quitting, ask your health care provider.  Lose weight if you are overweight, and maintain a healthy weight as directed by your health care provider.  Eat a diet that is low in fat and cholesterol. If you need help, ask your health care provider.  Exercise regularly. Ask your health care provider to suggest some good activities for you.  Use compression stockings or other mechanical devices as directed by your health care provider.  Take good care of your feet.  Wear comfortable shoes that fit well.  Check your feet often for any cuts or sores. SEEK MEDICAL CARE IF:  You have cramps in your legs  while walking.  You have leg pain when you are at rest.  You have coldness in a leg or foot.  Your skin changes.  You have erectile dysfunction.  You have cuts or sores on your feet that are not healing. SEEK IMMEDIATE MEDICAL CARE IF:  Your arm or leg turns cold and blue.  Your arms or legs become red, warm, swollen, painful, or numb.  You have chest pain or trouble breathing.  You suddenly have weakness in your face, arm, or leg.  You become very confused or lose the ability to speak.  You suddenly have a very bad headache or lose your vision.   This information is not intended to replace advice given to you by your health care provider. Make sure you discuss any questions you have with your health care provider.   Document Released: 12/30/2004 Document Revised: 12/13/2014 Document Reviewed: 05/02/2014 [employer] Interactive Patient Education 2016 [employer]     Steps to Quit Smoking  Smoking tobacco can be harmful to your health and can affect almost every organ in your body. Smoking puts you, and those around you, at risk for developing many serious chronic diseases. Quitting smoking is difficult, but it is one of   the best things that you can do for your health. It is never too late to quit. WHAT ARE THE BENEFITS OF QUITTING SMOKING? When you quit smoking, you lower your risk of developing serious diseases and conditions, such as:  Lung cancer or lung disease, such as COPD.  Heart disease.  Stroke.  Heart attack.  Infertility.  Osteoporosis and bone fractures. Additionally, symptoms such as coughing, wheezing, and shortness of breath may get better when you quit. You may also find that you get sick less often because your body is stronger at fighting off colds and infections. If you are pregnant, quitting smoking can help to reduce your chances of having a baby of low birth weight. HOW DO I GET READY TO QUIT? When you decide to quit smoking, create a plan to  make sure that you are successful. Before you quit:  Pick a date to quit. Set a date within the next two weeks to give you time to prepare.  Write down the reasons why you are quitting. Keep this list in places where you will see it often, such as on your bathroom mirror or in your car or wallet.  Identify the people, places, things, and activities that make you want to smoke (triggers) and avoid them. Make sure to take these actions:  Throw away all cigarettes at home, at work, and in your car.  Throw away smoking accessories, such as ashtrays and lighters.  Clean your car and make sure to empty the ashtray.  Clean your home, including curtains and carpets.  Tell your family, friends, and coworkers that you are quitting. Support from your loved ones can make quitting easier.  Talk with your health care provider about your options for quitting smoking.  Find out what treatment options are covered by your health insurance. WHAT STRATEGIES CAN I USE TO QUIT SMOKING?  Talk with your healthcare provider about different strategies to quit smoking. Some strategies include:  Quitting smoking altogether instead of gradually lessening how much you smoke over a period of time. Research shows that quitting "cold turkey" is more successful than gradually quitting.  Attending in-person counseling to help you build problem-solving skills. You are more likely to have success in quitting if you attend several counseling sessions. Even short sessions of 10 minutes can be effective.  Finding resources and support systems that can help you to quit smoking and remain smoke-free after you quit. These resources are most helpful when you use them often. They can include:  Online chats with a counselor.  Telephone quitlines.  Printed self-help materials.  Support groups or group counseling.  Text messaging programs.  Mobile phone applications.  Taking medicines to help you quit smoking. (If you are  pregnant or breastfeeding, talk with your health care provider first.) Some medicines contain nicotine and some do not. Both types of medicines help with cravings, but the medicines that include nicotine help to relieve withdrawal symptoms. Your health care provider may recommend:  Nicotine patches, gum, or lozenges.  Nicotine inhalers or sprays.  Non-nicotine medicine that is taken by mouth. Talk with your health care provider about combining strategies, such as taking medicines while you are also receiving in-person counseling. Using these two strategies together makes you more likely to succeed in quitting than if you used either strategy on its own. If you are pregnant or breastfeeding, talk with your health care provider about finding counseling or other support strategies to quit smoking. Do not take medicine to help you   quit smoking unless told to do so by your health care provider. WHAT THINGS CAN I DO TO MAKE IT EASIER TO QUIT? Quitting smoking might feel overwhelming at first, but there is a lot that you can do to make it easier. Take these important actions:  Reach out to your family and friends and ask that they support and encourage you during this time. Call telephone quitlines, reach out to support groups, or work with a counselor for support.  Ask people who smoke to avoid smoking around you.  Avoid places that trigger you to smoke, such as bars, parties, or smoke-break areas at work.  Spend time around people who do not smoke.  Lessen stress in your life, because stress can be a smoking trigger for some people. To lessen stress, try:  Exercising regularly.  Deep-breathing exercises.  Yoga.  Meditating.  Performing a body scan. This involves closing your eyes, scanning your body from head to toe, and noticing which parts of your body are particularly tense. Purposefully relax the muscles in those areas.  Download or purchase mobile phone or tablet apps (applications)  that can help you stick to your quit plan by providing reminders, tips, and encouragement. There are many free apps, such as QuitGuide from the CDC (Centers for Disease Control and Prevention). You can find other support for quitting smoking (smoking cessation) through smokefree.gov and other websites. HOW WILL I FEEL WHEN I QUIT SMOKING? Within the first 24 hours of quitting smoking, you may start to feel some withdrawal symptoms. These symptoms are usually most noticeable 2-3 days after quitting, but they usually do not last beyond 2-3 weeks. Changes or symptoms that you might experience include:  Mood swings.  Restlessness, anxiety, or irritation.  Difficulty concentrating.  Dizziness.  Strong cravings for sugary foods in addition to nicotine.  Mild weight gain.  Constipation.  Nausea.  Coughing or a sore throat.  Changes in how your medicines work in your body.  A depressed mood.  Difficulty sleeping (insomnia). After the first 2-3 weeks of quitting, you may start to notice more positive results, such as:  Improved sense of smell and taste.  Decreased coughing and sore throat.  Slower heart rate.  Lower blood pressure.  Clearer skin.  The ability to breathe more easily.  Fewer sick days. Quitting smoking is very challenging for most people. Do not get discouraged if you are not successful the first time. Some people need to make many attempts to quit before they achieve long-term success. Do your best to stick to your quit plan, and talk with your health care provider if you have any questions or concerns.   This information is not intended to replace advice given to you by your health care provider. Make sure you discuss any questions you have with your health care provider.   Document Released: 11/16/2001 Document Revised: 04/08/2015 Document Reviewed: 04/08/2015 [employer] Interactive Patient Education 2016 [employer]  

## 2016-07-22 ENCOUNTER — Encounter: Payer: Self-pay | Admitting: Cardiology

## 2016-08-04 ENCOUNTER — Ambulatory Visit (INDEPENDENT_AMBULATORY_CARE_PROVIDER_SITE_OTHER): Payer: Medicare Other | Admitting: Cardiology

## 2016-08-04 ENCOUNTER — Encounter: Payer: Self-pay | Admitting: Cardiology

## 2016-08-04 VITALS — BP 122/78 | HR 84 | Ht 71.0 in | Wt 137.0 lb

## 2016-08-04 DIAGNOSIS — Z950 Presence of cardiac pacemaker: Secondary | ICD-10-CM | POA: Diagnosis not present

## 2016-08-04 DIAGNOSIS — I25119 Atherosclerotic heart disease of native coronary artery with unspecified angina pectoris: Secondary | ICD-10-CM

## 2016-08-04 DIAGNOSIS — I712 Thoracic aortic aneurysm, without rupture, unspecified: Secondary | ICD-10-CM

## 2016-08-04 DIAGNOSIS — N183 Chronic kidney disease, stage 3 (moderate): Secondary | ICD-10-CM

## 2016-08-04 DIAGNOSIS — I110 Hypertensive heart disease with heart failure: Secondary | ICD-10-CM

## 2016-08-04 DIAGNOSIS — Z720 Tobacco use: Secondary | ICD-10-CM

## 2016-08-04 DIAGNOSIS — I951 Orthostatic hypotension: Secondary | ICD-10-CM

## 2016-08-04 DIAGNOSIS — I5022 Chronic systolic (congestive) heart failure: Secondary | ICD-10-CM

## 2016-08-04 LAB — CBC WITH DIFFERENTIAL/PLATELET
Basophils Absolute: 82 cells/uL (ref 0–200)
Basophils Relative: 1 %
Eosinophils Absolute: 246 cells/uL (ref 15–500)
Eosinophils Relative: 3 %
HCT: 40.4 % (ref 38.5–50.0)
Hemoglobin: 13.4 g/dL (ref 13.2–17.1)
Lymphocytes Relative: 35 %
Lymphs Abs: 2870 cells/uL (ref 850–3900)
MCH: 32 pg (ref 27.0–33.0)
MCHC: 33.2 g/dL (ref 32.0–36.0)
MCV: 96.4 fL (ref 80.0–100.0)
MPV: 11.8 fL (ref 7.5–12.5)
Monocytes Absolute: 492 cells/uL (ref 200–950)
Monocytes Relative: 6 %
Neutro Abs: 4510 cells/uL (ref 1500–7800)
Neutrophils Relative %: 55 %
Platelets: 126 10*3/uL — ABNORMAL LOW (ref 140–400)
RBC: 4.19 MIL/uL — ABNORMAL LOW (ref 4.20–5.80)
RDW: 16.2 % — ABNORMAL HIGH (ref 11.0–15.0)
WBC: 8.2 10*3/uL (ref 3.8–10.8)

## 2016-08-04 LAB — COMPREHENSIVE METABOLIC PANEL
ALT: 7 U/L — ABNORMAL LOW (ref 9–46)
AST: 16 U/L (ref 10–35)
Albumin: 3.5 g/dL — ABNORMAL LOW (ref 3.6–5.1)
Alkaline Phosphatase: 106 U/L (ref 40–115)
BUN: 25 mg/dL (ref 7–25)
CO2: 26 mmol/L (ref 20–31)
Calcium: 8.8 mg/dL (ref 8.6–10.3)
Chloride: 106 mmol/L (ref 98–110)
Creat: 1.71 mg/dL — ABNORMAL HIGH (ref 0.70–1.11)
Glucose, Bld: 81 mg/dL (ref 65–99)
Potassium: 4.2 mmol/L (ref 3.5–5.3)
Sodium: 142 mmol/L (ref 135–146)
Total Bilirubin: 0.6 mg/dL (ref 0.2–1.2)
Total Protein: 6.9 g/dL (ref 6.1–8.1)

## 2016-08-04 MED ORDER — HYDRALAZINE HCL 25 MG PO TABS: 25.0000 mg | ORAL_TABLET | Freq: Three times a day (TID) | ORAL | 3 refills | Status: DC

## 2016-08-04 MED ORDER — ISOSORBIDE MONONITRATE ER 60 MG PO TB24: 60.0000 mg | ORAL_TABLET | Freq: Every day | ORAL | 3 refills | Status: DC

## 2016-08-04 NOTE — Progress Notes (Signed)
Cardiology Office Note:    Date:  08/04/2016   ID:  [name], DOB 30-May-1927, MRN [id number]  PCP:  Delorse Lek, MD  Cardiologist:  Dr. Tobias Alexander   Electrophysiologist:  [facility] in [place], [place]  VVS: Dr. [name]  Referring MD: Delorse Lek, MD   CC: follow up for CHF  History of Present Illness:     [name] is a 82 y.o. male with a hx of CAD status post CABG, PAD, prior stroke, COPD, hypothyroidism, symptomatic bradycardia and syncope status post pacemaker implantation, tobacco abuse, thoracic and infrarenal abdominal aortic aneurysms. Admitted 1/15 with non-STEMI. He underwent PCI with BMS to the SVG-OM. He underwent right external iliac and common femoral and profundus endarterectomy in 7/16. He is followed by vascular surgery. Last seen by Dr. Delton See 2/17.    Admitted 4/6-4/10 with NSTEMI.  Troponin levels were mildly elevated without clear trend (? Demand ischemia).  CXR and BNP were concerning for CHF and he was given a dose of IV Lasix.  His BP was uncontrolled.  Cardiac catheterization was originally planned. However, his renal function deteriorated some and LHC was postponed. Hospital team reviewed with Dr. Delton See and it was decided to work on controlling his blood pressure and managing his non-STEMI medically. Elevated Troponin levels may have been related to demand ischemia in the setting of HTN urgency.  Nitrates and hydralazine doses were adjusted. Follow-up echocardiogram demonstrated EF 35-40% with lateral hypokinesis and inferior and inferolateral akinesis.  FU CTA of chest demonstrated worsening of his descending thoracic aorta up to 5.6 cm.  He was seen by Dr. Darrick Penna for VVS. Aneurysm size was not yet at cut off for repair. However, he was felt to be high risk for repair. OP FU with VVS (Dr. [name]) was recommended.    04/26/2016  - the patient is coming after 1 months, 6 weeks ago he was admitted with acute on chronic systolic CHF and troponin elevation as  a result of it. Left heart cardiac was not performed as he has CKD stage III. His symptoms resolved was continued only on Lasix 20 mg daily. Today he states that he doesn't have chest pain or shortness of breath that he just feels tired. He has mild residual lower extremity edema up to his calves. He was seen by vascular surgery last week for progressively worsening descending thoracic aortic aneurysm measuring 5.6 cm at aortic hiatus. from previously 5.2 cm. Patient was offered surgery however considering his age kidney impairment the risks currently outweighs the benefit considering he is completely asymptomatic. He was also seen at the door [facility] and it appears that he has dislodged one of he is pacemaker leads, he is scheduled to have a lead replacement potentially pacemaker placement Wednesday, May 24.  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  08/04/2016 - the patient is coming after 3 months since the last visit he has had prehospital admissions, one was for cellulitis in July afterwards he was checked in [place] and pacemaker malfunction was detected and he obtain a new pacemaker. This was complicated by bleeding from the insertion site. His blood pressure medication was decreased as he became hypotensive after that. He notes feels that her however still has significant orthostatic hypotension especially when he stands up and he feels quite tired. He denies any syncope, no lower extremity edema no orthopnea no chest pain.  Past Medical History:  Diagnosis Date  . AAA (abdominal aortic aneurysm) (HCC)  Has a known AAA of 3.2 x 3.4 cm by abdominal untrasound 03/27/12. Followup abdominal ultrasound on 03/29/13 showed a diameter of 3.6 cm, which is stable  . Abdominal bruit    Loud  . CAD (coronary artery disease)    a. s/p MI in 1988;  b. s/p CABG in 2000 (Dr. Barry Dienes) x 4, LIMA to LAD, SVG to diagonal, SVG to [facility], SVG to PDA;  c. 12/2013 NSTEMI/abnl MV;  d.  01/2014 Cath/PCI: native 3VD, VG->RCA 100, VG->OM aneurysmal 40ost/m, 37m(5.0x12 Veriflex BMS), VG->Diag aneurysmal, LIMA->LAD nl.  . Chronic systolic CHF (congestive heart failure) (HCC)    Hospitalization for CHF from 05/04/13-05/06/13. Treated medically. Had possible pneumonia with short course of antibiotics.  Marland Kitchen COPD (chronic obstructive pulmonary disease) (HCC)   . CVA (cerebral infarction) 07/2011   Remote left brain infarct with sensory deficits and had a left internal carotid stent placed by Dr. [name] at that time, no recurrent CVA or TIAs or CNS events.  . Headache   . Hyperlipidemia   . Hypertension   . Hypothyroid    On supplement "don't know if I take RX or not for my thyroid" (01/16/2014)  . Orthostatic hypotension   . PAD (peripheral artery disease) (HCC)    Stable. Had a past RFPBG by Dr. [name] in 2009. this graft is occluded with reconstitution in the popliteal artery, which was patent. Right tibial was occluded and 2-vessel runoff on angiography done by [name] in 12/2010.  Marland Kitchen Peripheral vascular disease (HCC)   . Pneumonia   . Presence of permanent cardiac pacemaker   . Sick sinus syndrome (HCC)    PPM implanted for this and syncope   . Stroke (HCC)   . Syncope    tilt table test/8.10.01/orthostatic hypotension  . TIA (transient ischemic attack)     Past Surgical History:  Procedure Laterality Date  . ABDOMINAL AORTAGRAM N/A 02/22/2012   Procedure: ABDOMINAL Ronny Flurry;  Surgeon: Nada Libman, MD;  Location: [facility] CATH LAB;  Service: Cardiovascular;  Laterality: N/A;  . abdominal aortagram    . CARDIAC CATHETERIZATION  2000  . CAROTID ANGIOGRAM Bilateral 02/22/2012   Procedure: CAROTID ANGIOGRAM;  Surgeon: Nada Libman, MD;  Location: [facility] CATH LAB;  Service: Cardiovascular;  Laterality: Bilateral;  . CAROTID STENT Left 06/23/11   By [name]  . CATARACT EXTRACTION W/ INTRAOCULAR LENS  IMPLANT, BILATERAL Bilateral   . COLONOSCOPY    . CORONARY ANGIOPLASTY WITH  STENT PLACEMENT  2001-01/16/2014   "think today makes #4" (01/16/2014)  . CORONARY ARTERY BYPASS GRAFT  2000   1st in 2000 (Dr. Barry Dienes) x 4, LIMA to LAD, SVG to diagonal, SVG to OM4, SVG to PDA. Re-cath in May 2012 with patent LIMA to LAD, patent SVG to diagonal, patent SVG to OM with left-to-right collaterals to an occluded right.  Marland Kitchen ENDARTERECTOMY FEMORAL Right 06/25/2015   Procedure: ENDARTERECTOMY RIGHT FEMORAL ARTERY ;  Surgeon: Larina Earthly, MD;  Location: [facility] OR;  Service: Vascular;  Laterality: Right;  . FEMORAL BYPASS Right ?2001  . INGUINAL HERNIA REPAIR Right 1980's  . INSERT / REPLACE / REMOVE PACEMAKER     implanted 2003 by Dr Jenne Campus with gen change (MDT [id number]) [facility]  . LEFT HEART CATHETERIZATION WITH CORONARY/GRAFT ANGIOGRAM N/A 01/16/2014   Procedure: LEFT HEART CATHETERIZATION WITH Isabel Caprice;  Surgeon: Kathleene Hazel, MD;  Location: [facility] CATH LAB;  Service: Cardiovascular;  Laterality: N/A;  . PATCH ANGIOPLASTY Right 06/25/2015   Procedure: RIGHT  COMMON FEMORAL ARTERY AND PROFUNDA PATCH ANGIOPLASTY USING HEMASHIELD PLATINUM FINESSE PATCH ;  Surgeon: Larina Earthly, MD;  Location: [facility] OR;  Service: Vascular;  Laterality: Right;  . PERIPHERAL VASCULAR CATHETERIZATION N/A 06/24/2015   Procedure: Abdominal Aortogram;  Surgeon: Nada Libman, MD;  Location: [facility] INVASIVE CV LAB;  Service: Cardiovascular;  Laterality: N/A;    Current Medications: Outpatient Medications Prior to Visit  Medication Sig Dispense Refill  . acetaminophen (TYLENOL) 325 MG tablet Take 650 mg by mouth every 6 (six) hours as needed for mild pain. Reported on 11/25/2015    . aspirin 81 MG tablet Take 81 mg by mouth daily.    Marland Kitchen atorvastatin (LIPITOR) 40 MG tablet Take 1 tablet (40 mg total) by mouth daily at 6 PM. 30 tablet 5  . metoprolol succinate (TOPROL-XL) 100 MG 24 hr tablet Take 50 mg by mouth daily. Take with or immediately following a meal.     . nitroGLYCERIN (NITROSTAT) 0.4 MG SL  tablet Place 1 tablet (0.4 mg total) under the tongue every 5 (five) minutes as needed. For chest pain 25 tablet 3  . potassium chloride (K-DUR) 10 MEQ tablet Take 1 tablet (10 mEq total) by mouth daily. 90 tablet 3  . Tamsulosin HCl (FLOMAX) 0.4 MG CAPS Take 0.4 mg by mouth daily after supper.     . isosorbide mononitrate (IMDUR) 60 MG 24 hr tablet Take 1.5 tablets (90 mg total) by mouth daily. 45 tablet 5  . amLODipine (NORVASC) 10 MG tablet Take 1 tablet (10 mg total) by mouth daily. 90 tablet 3  . clopidogrel (PLAVIX) 75 MG tablet Take 1 tablet (75 mg total) by mouth at bedtime.    . furosemide (LASIX) 20 MG tablet Take 1 tablet (20 mg total) by mouth daily. 90 tablet 3  . hydrALAZINE (APRESOLINE) 100 MG tablet Take 1 tablet (100 mg total) by mouth every 8 (eight) hours. (Patient not taking: Reported on 08/04/2016) 90 tablet 11   No facility-administered medications prior to visit.      Allergies:   Tramadol; Pravachol; Pravastatin; Simvastatin; Other; Oxycodone; and Terazosin   Social History   Social History  . Marital status: Widowed    Spouse name: N/A  . Number of children: 4  . Years of education: N/A   Occupational History  . retired     Advice worker, Control and instrumentation engineer   Social History Main Topics  . Smoking status: Former Smoker    Packs/day: 0.50    Years: 68.00    Types: Cigarettes    Quit date: 05/02/2015  . Smokeless tobacco: Never Used  . Alcohol use No  . Drug use: No  . Sexual activity: Yes   Other Topics Concern  . None   Social History Narrative   Lives in [place], spouse is now deceased.  Receives most care at the [facility].   4 children, 6 grandchildren     Family History:  The patient's    family history includes Cancer (age of onset: 73) in his brother; Hyperlipidemia in his brother; Hypertension in his brother; Lung disease in his father; Prostate cancer in his brother.   ROS:   Please see the history of present illness.    Review of Systems    Constitution: Positive for chills, decreased appetite, malaise/fatigue and weight loss.  HENT: Positive for headaches.   Cardiovascular: Positive for chest pain and dyspnea on exertion.  Respiratory: Positive for shortness of breath.   Musculoskeletal: Positive for joint pain.  Gastrointestinal: Positive for nausea.  Neurological: Positive for dizziness and loss of balance.  Psychiatric/Behavioral: The patient is nervous/anxious.    All other systems reviewed and are negative.   Physical Exam:    VS:  BP 122/78   Pulse 84   Ht 5\' 11"  (1.803 m)   Wt 137 lb (62.1 kg)   BMI 19.11 kg/m    GEN: Well nourished, well developed, in no acute distress  HEENT: normal  Neck: no JVD, no masses Cardiac: Normal S1/S2,  RRR; no murmurs, rubs, or gallops, trace-1+ RLE edema (chronic RLE edema)     Respiratory: Decreased breath sounds bilaterally; no wheezing, rhonchi or rales GI: soft, nontender, nondistended MS: no deformity or atrophy  Skin: warm and dry Neuro: No focal deficits  Psych: Alert and oriented x 3, normal affect  Wt Readings from Last 3 Encounters:  08/04/16 137 lb (62.1 kg)  06/29/16 135 lb 8 oz (61.5 kg)  05/13/16 134 lb (60.8 kg)      Studies/Labs Reviewed:     EKG:  EKG is  ordered today.  The ekg ordered today demonstrates A paced, HR 74, TWI V2-6, QTc 448 ms, no significant changes when compare with prior tracing.   Recent Labs: 01/28/2016: ALT 9 01/31/2016: Magnesium 2.1 03/11/2016: B Natriuretic Peptide 2,999.7 05/13/2016: BUN 18; Creatinine, Ser 1.57; Hemoglobin 14.6; Platelets 127; Potassium 3.5; Sodium 139   Recent Lipid Panel    Component Value Date/Time   CHOL 101 07/06/2011 0635   TRIG 61 07/06/2011 0635   HDL 39 (L) 07/06/2011 0635   CHOLHDL 2.6 07/06/2011 0635   VLDL 12 07/06/2011 0635   LDLCALC 50 07/06/2011 0635    Additional studies/ records that were reviewed today include:   Chest CTA  03/11/16 IMPRESSION: Aneurysmal dilatation of descending  thoracic aorta up to 5.6 cm diameter at aortic hiatus, 5.2 cm previously. No definite aortic dissection is identified though an area of inhomogeneous aortic luminal enhancement is seen at the proximal descending thoracic aorta, question related to an admixture of opacified non-opacified blood on the basis of poor cardiac output. If patient has persistent pain, may consider followup CT imaging with contrast to reassess aorta. BILATERAL low-attenuation pleural effusions and compressive atelectasis of the posterior lungs. Nonspecific mediastinal adenopathy. Scattered atherosclerotic disease.  Echo 03/13/16 Mild focal basal septal hypertrophy, EF 35-40%, lateral HK, inferior and inferolateral akinesis, moderate AI, mild to moderate MR, moderate RAE, mild to moderate TR, PASP 66 mmHg  ABI 12/16 R 0.62; L 0.63  Myoview 6/16 Inferolateral infarct, small apical infarct, no ischemia, EF 32%; high risk based upon multiple infarct areas and low EF  Echo 12/15 GLS -14.5, EF 45%, diffuse HK, mild to moderate AI, mild MR, mild to moderate LAE, mild to moderate RAE, moderate TR, PASP 35 mm Hg  LHC 2/15 Left main: Distal 30% stenosis.  Left Anterior Descending Artery:  prox 70% followed by mid 100% Circumflex Artery:  mid occlusion.  Right Coronary Artery:  proximal  100%   The vein graft to the distal vessel is known to be occluded.  Graft Anatomy:  SVG to RCA is occluded SVG to OM is patent with 40% ostial stenosis, diffuse aneurysmal enlargement of entire vein graft with 40% mid body stenosis and 99% hazy mid body stenosis.  SVG to Diagonal is patent but the entire graft is aneurysmal with diffuse disease, TIMI-1 flow. Not a target for PCI LIMA to LAD is patent.  Left Ventricular Angiogram: Deferred.  PCI: 5.0 x  12 Veriflex bare metal stent in the mid body of the SVG to the OM Impression: 1. Severe triple vessel CAD with 3/4 patent grafts.  2. Recent NSTEMI and lateral wall ischemia on stress  test secondary to severe disease in body of SVG to OM 3. Successful PTCA/bare metal stent x 1 mid body of SVG to OM Recommendations: He will need ASA and Plavix for at least one month but longer if he tolerates well. Continue other cardiac meds. D/C home tomorrow if stable with f/u with Dr. Delton SeeNelson in 2-3 weeks.    ASSESSMENT:     1. Coronary artery disease involving native coronary artery of native heart with angina pectoris (HCC)   2. Orthostatic hypotension   3. Cardiac pacemaker in situ   4. Hypertensive heart disease with heart failure (HCC)   5. Tobacco abuse   6. Chronic systolic CHF (congestive heart failure) (HCC)   7. Thoracic aortic aneurysm without rupture (HCC)   8. CKD (chronic kidney disease), stage 3 (moderate)     PLAN:     In order of problems listed above:  1. Orthostatic hypotension - patients have been adjusted recently, amlodipine and furosemide have been discontinued, and hydralazine was decreased from 100 mg by mouth 3 times a day to 50 mg by mouth 3 times a day. I would further decrease hydralazine from 50 to 25 mg by mouth 3 times a day and Imdur were from 90 to 60 mg daily. We will bring him back in 2 weeks and recheck his symptoms and blood pressure and readjust his blood pressure meds as needed again.  2. CAD - s/p prior CABG and NSTEMI in 1/15 tx with DES to S-OM.  [facility] with elevated troponin levels in the setting of a/c systolic CHF and HTN urgency. LHC was cancelled due to worsening Creatinine.  He is doing better and denies further angina.  Continue med Rx. Continue ASA, Plavix, statin, nitrates, beta-blocker.  3. Thoracic Aneurysm - FU with Dr. Arbie CookeyEarly,  progressively worsening descending thoracic aortic aneurysm measuring 5.6 cm at aortic hiatus. from previously 5.2 cm. Patient was offered surgery however considering his age kidney impairment the risks currently outweighs the benefit considering he is completely asymptomatic.  4. Chronic  Systolic CHF - Volume stable. Continue Lasix 20 mg daily in the mornings and was taken extra 20 the afternoon when his swelling gets worse. Advised to use compressive stockings.    5. Ischemic CM - Continue beta-blocker, hydralazine, nitrates.  He is not on ACE inhibitor or angiotensin receptor blocker due to CKD.  Recent EF stable at 35-40%.    6. CKD - crea 1.59 on 03/22/16  7. HTN - okay for his age, continue the same management.    8. Tobacco abuse - He is not ready to quit.   9. S/p PPM - [facility] [place], [place], finding of dislodged lead, scheduled for LEEP and potentially pacemaker replacement this Wednesday, May 24   Medication Adjustments/Labs and Tests Ordered: Current medicines are reviewed at length with the patient today.  Concerns regarding medicines are outlined above.  Medication changes, Labs and Tests ordered today are outlined in the Patient Instructions noted below. Patient Instructions  Medication Instructions:   DECREASE YOUR HYDRALAZINE TO 25 MG BY MOUTH THREE TIMES DAILY  DECREASE YOUR IMDUR TO 60 MG ONCE DAILY    Labwork:  TODAY--CMET AND CBC W DIFF    Follow-Up:  IN 2 WEEKS IN [facility] CLINIC WITH  SALLY EARL OR MEGAN SUPPLE FOR RECENT ORTHOSTATIC HYPOTENSION AND COMPLAINTS OF DIZZINESS      If you need a refill on your cardiac medications before your next appointment, please call your pharmacy.    Signed, Tobias Alexander, MD  08/04/2016 11:32 AM    [facility] [address] [place], [place], [place]  [address] Phone: [phone]; Fax: [phone]

## 2016-08-04 NOTE — Patient Instructions (Signed)
Medication Instructions:   DECREASE YOUR HYDRALAZINE TO 25 MG BY MOUTH THREE TIMES DAILY  DECREASE YOUR IMDUR TO 60 MG ONCE DAILY    Labwork:  TODAY--CMET AND CBC W DIFF    Follow-Up:  IN 2 WEEKS IN BP CLINIC WITH SALLY EARL OR MEGAN SUPPLE FOR RECENT ORTHOSTATIC HYPOTENSION AND COMPLAINTS OF DIZZINESS      If you need a refill on your cardiac medications before your next appointment, please call your pharmacy.

## 2016-08-24 ENCOUNTER — Ambulatory Visit: Payer: Non-veteran care

## 2016-09-30 ENCOUNTER — Encounter (HOSPITAL_COMMUNITY): Payer: Self-pay

## 2016-09-30 ENCOUNTER — Emergency Department (HOSPITAL_COMMUNITY)
Admission: EM | Admit: 2016-09-30 | Discharge: 2016-09-30 | Disposition: A | Discharge status: Home / Self Care | Payer: Non-veteran care | Attending: Emergency Medicine | Admitting: Emergency Medicine

## 2016-09-30 ENCOUNTER — Emergency Department (HOSPITAL_COMMUNITY): Payer: Non-veteran care

## 2016-09-30 DIAGNOSIS — I252 Old myocardial infarction: Secondary | ICD-10-CM | POA: Diagnosis not present

## 2016-09-30 DIAGNOSIS — Z87891 Personal history of nicotine dependence: Secondary | ICD-10-CM | POA: Insufficient documentation

## 2016-09-30 DIAGNOSIS — I5043 Acute on chronic combined systolic (congestive) and diastolic (congestive) heart failure: Secondary | ICD-10-CM | POA: Insufficient documentation

## 2016-09-30 DIAGNOSIS — N644 Mastodynia: Secondary | ICD-10-CM | POA: Diagnosis not present

## 2016-09-30 DIAGNOSIS — R079 Chest pain, unspecified: Secondary | ICD-10-CM | POA: Diagnosis present

## 2016-09-30 DIAGNOSIS — E039 Hypothyroidism, unspecified: Secondary | ICD-10-CM | POA: Insufficient documentation

## 2016-09-30 DIAGNOSIS — N189 Chronic kidney disease, unspecified: Secondary | ICD-10-CM | POA: Insufficient documentation

## 2016-09-30 DIAGNOSIS — J449 Chronic obstructive pulmonary disease, unspecified: Secondary | ICD-10-CM | POA: Insufficient documentation

## 2016-09-30 DIAGNOSIS — Z955 Presence of coronary angioplasty implant and graft: Secondary | ICD-10-CM | POA: Insufficient documentation

## 2016-09-30 DIAGNOSIS — Z8673 Personal history of transient ischemic attack (TIA), and cerebral infarction without residual deficits: Secondary | ICD-10-CM | POA: Diagnosis not present

## 2016-09-30 DIAGNOSIS — Z7982 Long term (current) use of aspirin: Secondary | ICD-10-CM | POA: Diagnosis not present

## 2016-09-30 DIAGNOSIS — I2510 Atherosclerotic heart disease of native coronary artery without angina pectoris: Secondary | ICD-10-CM | POA: Diagnosis not present

## 2016-09-30 DIAGNOSIS — Z951 Presence of aortocoronary bypass graft: Secondary | ICD-10-CM | POA: Diagnosis not present

## 2016-09-30 DIAGNOSIS — Z950 Presence of cardiac pacemaker: Secondary | ICD-10-CM | POA: Insufficient documentation

## 2016-09-30 DIAGNOSIS — Z79899 Other long term (current) drug therapy: Secondary | ICD-10-CM | POA: Diagnosis not present

## 2016-09-30 DIAGNOSIS — I130 Hypertensive heart and chronic kidney disease with heart failure and stage 1 through stage 4 chronic kidney disease, or unspecified chronic kidney disease: Secondary | ICD-10-CM | POA: Insufficient documentation

## 2016-09-30 LAB — CBC
HEMATOCRIT: 44.5 % (ref 39.0–52.0)
Hemoglobin: 14.8 g/dL (ref 13.0–17.0)
MCH: 32.2 pg (ref 26.0–34.0)
MCHC: 33.3 g/dL (ref 30.0–36.0)
MCV: 96.9 fL (ref 78.0–100.0)
PLATELETS: 105 10*3/uL — AB (ref 150–400)
RBC: 4.59 MIL/uL (ref 4.22–5.81)
RDW: 15.5 % (ref 11.5–15.5)
WBC: 7.5 10*3/uL (ref 4.0–10.5)

## 2016-09-30 LAB — BASIC METABOLIC PANEL
Anion gap: 8 (ref 5–15)
BUN: 20 mg/dL (ref 6–20)
CHLORIDE: 107 mmol/L (ref 101–111)
CO2: 27 mmol/L (ref 22–32)
CREATININE: 1.75 mg/dL — AB (ref 0.61–1.24)
Calcium: 8.9 mg/dL (ref 8.9–10.3)
GFR calc Af Amer: 39 mL/min — ABNORMAL LOW (ref >60)
GFR calc non Af Amer: 33 mL/min — ABNORMAL LOW (ref >60)
GLUCOSE: 99 mg/dL (ref 65–99)
POTASSIUM: 3.7 mmol/L (ref 3.5–5.1)
Sodium: 142 mmol/L (ref 135–145)

## 2016-09-30 LAB — I-STAT TROPONIN, ED: Troponin i, poc: 0.16 ng/mL (ref 0.00–0.08)

## 2016-09-30 MED ORDER — ACETAMINOPHEN-CODEINE #3 300-30 MG PO TABS: 1.0000 | ORAL_TABLET | Freq: Four times a day (QID) | ORAL | 0 refills | Status: DC | PRN

## 2016-09-30 MED ORDER — ACETAMINOPHEN 325 MG PO TABS
650.0000 mg | ORAL_TABLET | Freq: Once | ORAL | Status: AC
  Administered 2016-09-30: 650 mg via ORAL
  Filled 2016-09-30: qty 2

## 2016-09-30 NOTE — ED Triage Notes (Signed)
Pt reports chest pain around left breast. Pt reports pain when he touches his left nipple. [facility] [place] [facility] [place] [facility] [place] [place] get an appt for 1 week.

## 2016-09-30 NOTE — Discharge Instructions (Signed)
You have been evaluated for your pain in your left breast.  [facility] for [facility].  Meanwhile, apply warm compress to affected area, and take tylenol #3 as needed for pain.  Return if your condition worsen.

## 2016-09-30 NOTE — ED Notes (Signed)
Readjusted patient on stretcher, no needs at this time

## 2016-09-30 NOTE — ED Notes (Signed)
Elevated troponin.  Called Dr. Dalene SeltzerSchlossman to give information.   Will get patient to next room available.

## 2016-09-30 NOTE — ED Provider Notes (Signed)
[facility]-EMER[facility]ENCY DEPT Provider Note   CSN: [id number] Arrival date & time: 09/30/16  9562     History   Chief Complaint Chief Complaint  Patient presents with  . Chest Pain    HPI [name] is a 82 y.o. male.  HPI   82 year old male with history of CAD status post MI status post CABG, AAA, COPD, hypertension, presenting for evaluation of chest pain. Patient report for the past week he has noticed tenderness to his left nipple has been persistent, worsening with palpation or with movement. Pain is causing him great consternation. Pain felt different from prior MI. There is no associated lightheadedness, dizziness, diaphoresis or nausea with this pain. It is nonradiating. There is no nipple discharge. No history of cancer. No fever or night sweats. He does report gradual weight loss with this is ongoing for several years. He tried to get an appointment to be seen at the [facility] but his next appointment is in several weeks. No specific treatment at home.  Past Medical History:  Diagnosis Date  . AAA (abdominal aortic aneurysm) (HCC)    Has a known AAA of 3.2 x 3.4 cm by abdominal untrasound 03/27/12. Followup abdominal ultrasound on 03/29/13 showed a diameter of 3.6 cm, which is stable  . Abdominal bruit    Loud  . CAD (coronary artery disease)    a. s/p MI in 1988;  b. s/p CABG in 2000 (Dr. Barry Dienes) x 4, LIMA to LAD, SVG to diagonal, SVG to [facility], SVG to PDA;  c. 12/2013 NSTEMI/abnl MV;  d. 01/2014 Cath/PCI: native 3VD, VG->RCA 100, VG->OM aneurysmal 40ost/m, 16m(5.0x12 Veriflex BMS), VG->Diag aneurysmal, LIMA->LAD nl.  . Chronic systolic CHF (congestive heart failure) (HCC)    Hospitalization for CHF from 05/04/13-05/06/13. Treated medically. Had possible pneumonia with short course of antibiotics.  Marland Kitchen COPD (chronic obstructive pulmonary disease) (HCC)   . CVA (cerebral infarction) 07/2011   Remote left brain infarct with sensory deficits and had a left internal carotid stent placed by Dr. [name]  at that time, no recurrent CVA or TIAs or CNS events.  . Headache   . Hyperlipidemia   . Hypertension   . Hypothyroid    On supplement "don't know if I take RX or not for my thyroid" (01/16/2014)  . Orthostatic hypotension   . PAD (peripheral artery disease) (HCC)    Stable. Had a past RFPBG by Dr. [name] in 2009. this graft is occluded with reconstitution in the popliteal artery, which was patent. Right tibial was occluded and 2-vessel runoff on angiography done by [name] in 12/2010.  Marland Kitchen Peripheral vascular disease (HCC)   . Pneumonia   . Presence of permanent cardiac pacemaker   . Sick sinus syndrome (HCC)    PPM implanted for this and syncope   . Stroke (HCC)   . Syncope    tilt table test/8.10.01/orthostatic hypotension  . TIA (transient ischemic attack)     Patient Active Problem List   Diagnosis Date Noted  . BP (high blood pressure) 03/28/2016  . Encounter for follow-up examination after completed treatment for conditions other than malignant neoplasm 03/28/2016  . Thoracic aortic aneurysm without rupture (HCC) 03/15/2016  . Coronary artery disease due to lipid rich plaque   . Chest pain   . Abdominal aortic aneurysm (AAA) without rupture (HCC)   . Hypertensive emergency   . Acute on chronic systolic CHF (congestive heart failure) (HCC)   . Personal history of other medical treatment 02/11/2016  . Left sided abdominal  pain 02/11/2016  . Nephropyelitis 02/11/2016  . Abdominal aortic aneurysm (AAA) (HCC) 02/09/2016  . Anxiety 02/09/2016  . Arterial vascular disease 02/09/2016  . Atrial fibrillation (HCC) 02/09/2016  . Benign fibroma of prostate 02/09/2016  . Edema leg 02/09/2016  . Arteriosclerosis of autologous arterial coronary artery bypass graft 02/09/2016  . Chronic kidney disease 02/09/2016  . Congestive heart failure (HCC) 02/09/2016  . Diaphragmatic hernia 02/09/2016  . Hemorrhagic cerebrovascular accident (CVA) (HCC) 02/09/2016  . Peptic ulcer without  hemorrhage or perforation 02/09/2016  . Arteriosclerosis of artery of extremity (HCC) 02/09/2016  . Peripheral vascular disease (HCC) 02/09/2016  . Abdominal pain 01/29/2016  . Nausea 01/29/2016  . UTI (urinary tract infection) 01/29/2016  . Right leg pain 01/29/2016  . AP (abdominal pain)   . Hematuria 07/04/2015  . Faintness   . Post-operative complication 07/03/2015  . Postoperative anemia due to acute blood loss 07/03/2015  . Anemia due to blood loss   . Gross hematuria   . H/O endarterectomy   . BPH (benign prostatic hyperplasia) 06/30/2015  . PAD (peripheral artery disease) (HCC) 06/10/2015  . Elevated troponin 05/13/2015  . Nontraumatic cortical hemorrhage of cerebral hemisphere (HCC) 01/31/2015  . Essential hypertension 01/31/2015  . Orthostatic hypotension 01/31/2015  . HLD (hyperlipidemia) 01/31/2015  . Coronary artery disease involving native coronary artery of native heart with angina pectoris (HCC) 01/31/2015  . PVD (peripheral vascular disease) (HCC) 01/31/2015  . Cardiac pacemaker in situ 01/31/2015  . Artificial cardiac pacemaker 01/31/2015  . Right sided weakness   . Stroke (HCC)   . ICH (intracerebral hemorrhage) (HCC) 11/17/2014  . CAP (community acquired pneumonia) 06/09/2014  . Acute renal failure superimposed on stage 3 chronic kidney disease (HCC) 06/09/2014  . CHF (congestive heart failure) (HCC) 06/09/2014  . Acute-on-chronic renal failure (HCC) 06/09/2014  . Unstable angina (HCC) 01/16/2014  . COPD with acute exacerbation (HCC) 01/04/2014  . Influenza with respiratory manifestations 01/02/2014  . Acute on chronic kidney failure (HCC) 01/02/2014  . Leukocytosis, unspecified 01/02/2014  . Malnutrition of moderate degree (HCC) 01/01/2014  . Acute bronchitis 12/30/2013  . Chronic combined systolic and diastolic CHF (congestive heart failure) (HCC) 12/30/2013  . NSTEMI (non-ST elevated myocardial infarction) (HCC) 12/30/2013  . CAD (coronary artery  disease) of artery bypass graft 12/13/2013  . Sick sinus syndrome [facility]) s/p [employer] PPM 12/13/2013  . Acute on chronic combined systolic and diastolic CHF (congestive heart failure) (HCC) 05/04/2013  . HTN (hypertension) 05/04/2013  . Hyperlipidemia 05/04/2013  . Tobacco abuse 05/04/2013  . History of CVA (cerebrovascular accident) 05/04/2013  . Thrombocytopenia (HCC) 05/04/2013  . Acute on chronic combined systolic and diastolic congestive heart failure (HCC) 05/04/2013  . Cerebrovascular accident, old 05/04/2013  . Colon polyp 04/19/2012  . Occlusion and stenosis of carotid artery without mention of cerebral infarction 02/07/2012  . Acquired involutional ptosis of eyelid 01/06/2012  . Entropion and trichiasis of eyelid 01/06/2012    Past Surgical History:  Procedure Laterality Date  . ABDOMINAL AORTAGRAM N/A 02/22/2012   Procedure: ABDOMINAL Ronny Flurry;  Surgeon: Nada Libman, MD;  Location: [facility] CATH LAB;  Service: Cardiovascular;  Laterality: N/A;  . abdominal aortagram    . CARDIAC CATHETERIZATION  2000  . CAROTID ANGIOGRAM Bilateral 02/22/2012   Procedure: CAROTID ANGIOGRAM;  Surgeon: Nada Libman, MD;  Location: [facility] CATH LAB;  Service: Cardiovascular;  Laterality: Bilateral;  . CAROTID STENT Left 06/23/11   By [name]  . CATARACT EXTRACTION W/ INTRAOCULAR LENS  IMPLANT, BILATERAL Bilateral   .  COLONOSCOPY    . CORONARY ANGIOPLASTY WITH STENT PLACEMENT  2001-01/16/2014   "think today makes #4" (01/16/2014)  . CORONARY ARTERY BYPASS GRAFT  2000   1st in 2000 ([name]) x 4, LIMA to LAD, SVG to diagonal, SVG to OM4, SVG to PDA. Re-cath in May 2012 with patent LIMA to LAD, patent SVG to diagonal, patent SVG to OM with left-to-right collaterals to an occluded right.  Marland Kitchen. ENDARTERECTOMY FEMORAL Right 06/25/2015   Procedure: ENDARTERECTOMY RIGHT FEMORAL ARTERY ;  Surgeon: Larina Earthlyodd F Early, MD;  [facility];  Service: Vascular;  Laterality: Right;  . FEMORAL BYPASS Right ?2001  .  INGUINAL HERNIA REPAIR Right 1980's  . INSERT / REPLACE / REMOVE PACEMAKER     implanted 2003 by Dr [name] with gen change (MDT [id number]) 08/2010 by [facility]  . LEFT HEART CATHETERIZATION WITH CORONARY/GRAFT ANGIOGRAM N/A 01/16/2014   Procedure: LEFT HEART CATHETERIZATION WITH Isabel CapriceORONARY/GRAFT ANGIOGRAM;  Surgeon: Kathleene Hazelhristopher D McAlhany, MD;  [facility];  Service: Cardiovascular;  Laterality: N/A;  . PATCH ANGIOPLASTY Right 06/25/2015   Procedure: RIGHT COMMON FEMORAL ARTERY AND PROFUNDA PATCH ANGIOPLASTY USING HEMASHIELD PLATINUM FINESSE PATCH ;  Surgeon: Larina Earthlyodd F Early, MD;  [facility];  Service: Vascular;  Laterality: Right;  . PERIPHERAL VASCULAR CATHETERIZATION N/A 06/24/2015   Procedure: Abdominal Aortogram;  Surgeon: Nada LibmanVance W Brabham, MD;  Location: [facility] INVASIVE CV LAB;  Service: Cardiovascular;  Laterality: N/A;       Home Medications    Prior to Admission medications   Medication Sig Start Date End Date Taking? Authorizing Provider  acetaminophen (TYLENOL) 325 MG tablet Take 650 mg by mouth every 6 (six) hours as needed for mild pain. Reported on 11/25/2015    Historical Provider, MD  aspirin 81 MG tablet Take 81 mg by mouth daily.    Historical Provider, MD  atorvastatin (LIPITOR) 40 MG tablet Take 1 tablet (40 mg total) by mouth daily at 6 PM. 05/14/15   Brittainy M Sharol HarnessSimmons, PA-C  hydrALAZINE (APRESOLINE) 25 MG tablet Take 1 tablet (25 mg total) by mouth 3 (three) times daily. 08/04/16 11/02/16  Lars MassonKatarina H Nelson, MD  isosorbide mononitrate (IMDUR) 60 MG 24 hr tablet Take 1 tablet (60 mg total) by mouth daily. 08/04/16 11/02/16  Lars MassonKatarina H Nelson, MD  metoprolol succinate (TOPROL-XL) 100 MG 24 hr tablet Take 50 mg by mouth daily. Take with or immediately following a meal.     Historical Provider, MD  nitroGLYCERIN (NITROSTAT) 0.4 MG SL tablet Place 1 tablet (0.4 mg total) under the tongue every 5 (five) minutes as needed. For chest pain 10/27/15   Lars MassonKatarina H Nelson, MD  potassium  chloride (K-DUR) 10 MEQ tablet Take 1 tablet (10 mEq total) by mouth daily. 02/26/16   Lars MassonKatarina H Nelson, MD  Tamsulosin HCl (FLOMAX) 0.4 MG CAPS Take 0.4 mg by mouth daily after supper.     Historical Provider, MD    Family History Family History  Problem Relation Age of Onset  . Cancer Brother 60    oral   . Prostate cancer Brother   . Hypertension Brother   . Hyperlipidemia Brother   . Lung disease Father     black lung disease    Social History Social History  Substance Use Topics  . Smoking status: Former Smoker    Packs/day: 0.50    Years: 68.00    Types: Cigarettes    Quit date: 05/02/2015  . Smokeless tobacco: Never Used  . Alcohol use No  Allergies   Tramadol; Pravachol; Pravastatin; Simvastatin; Other; Oxycodone; and Terazosin   Review of Systems Review of Systems  All other systems reviewed and are negative.    Physical Exam Updated Vital Signs BP 175/97   Pulse 78   Temp (!) 96.3 F (35.7 C) (Axillary)   Resp 14   SpO2 96%   Physical Exam  Constitutional: He appears well-developed and well-nourished. No distress.  HENT:  Head: Atraumatic.  Eyes: Conjunctivae are normal.  Neck: Neck supple.  Cardiovascular: Normal rate and regular rhythm.   Murmur heard. Pulmonary/Chest: Effort normal and breath sounds normal. He exhibits tenderness (tenderness to left areolar region on palpation.  a subcutaneous nodule can be felt to the affected area.  no erythema, no fluctuance, no nipple inversion.).  Pace maker noted to L upper chest, nontender and normal overlying skin   Neurological: He is alert.  Skin: No rash noted.  Psychiatric: He has a normal mood and affect.  Nursing note and vitals reviewed.    ED Treatments / Results  Labs (all labs ordered are listed, but only abnormal results are displayed) Labs Reviewed  BASIC METABOLIC PANEL - Abnormal; Notable for the following:       Result Value   Creatinine, Ser 1.75 (*)    GFR calc non Af Amer  33 (*)    GFR calc Af Amer 39 (*)    All other components within normal limits  CBC - Abnormal; Notable for the following:    Platelets 105 (*)    All other components within normal limits  I-STAT TROPOININ, ED - Abnormal; Notable for the following:    Troponin i, poc 0.16 (*)    All other components within normal limits    EKG  EKG Interpretation  Date/Time:  Thursday September 30 2016 09:53:31 EDT Ventricular Rate:  83 PR Interval:  316 QRS Duration: 116 QT Interval:  448 QTC Calculation: 526 R Axis:   -42 Text Interpretation:  Atrial-paced rhythm with prolonged AV conduction with occasional ventricular-paced complexes and Premature supraventricular complexes Left axis deviation ST & T wave abnormality, consider lateral ischemia Prolonged QT Abnormal ECG Lateral changes are more prominent than prior, less than 1mm ST  elevation inferiorly Confirmed by [facility] MD, ERIN ([id number]) on 09/30/2016 11:10:54 AM       Radiology Dg Chest 2 View  Result Date: 09/30/2016 CLINICAL DATA:  Chest pain. Shortness of breath. CHF. Smoker. Abdominal aortic aneurysm. EXAM: CHEST  2 VIEW COMPARISON:  05/13/2016 CT of 03/11/2016. FINDINGS: Mild pectus excavatum deformity. Moderate thoracic spondylosis. Pacer. Midline trachea. Mild cardiomegaly. Tortuous, aneurysmal thoracic aorta. This appears slightly increased since the most recent radiograph, but grossly similar to the scout film from the 03/11/2016 CT. No pleural effusion or pneumothorax. No congestive failure. Interstitial thickening is chronic. Mild scarring in the left lung base. IMPRESSION: Cardiomegaly without congestive failure. No definite explanation for chest pain. Thoracic aortic tortuosity and aneurysmal dilatation, grossly similar to the scout film from the chest CT of 03/11/2016. Electronically Signed   By: Jeronimo Greaves M.D.   On: 09/30/2016 10:52    Procedures Procedures (including critical care time)  Medications Ordered in  ED Medications  acetaminophen (TYLENOL) tablet 650 mg (650 mg Oral Given 09/30/16 1247)     Initial Impression / Assessment and Plan / ED Course  I have reviewed the triage vital signs and the nursing notes.  Pertinent labs & imaging results that were available during my care of the patient were reviewed by  me and considered in my medical decision making (see chart for details).  Clinical Course    BP 175/97   Pulse 78   Temp (!) 96.3 F (35.7 C) (Axillary)   Resp 14   SpO2 96%    Final Clinical Impressions(s) / ED Diagnoses   Final diagnoses:  Breast pain, left    New Prescriptions New Prescriptions   No medications on file   12:26 PM Pt with cardiac hx here with pain to L nipple region.  Pain is reproducible on exam and appears to be subcutaneous pain at the site of the areola.  No erythema, or fluctuance to suggest cellulitis or abscess.  Pain is less likely to be cardiac related.  Pt does have an elevated trop of 0.12 however he has had elevated troponin during each of his ER visits in the past few years.  EKG without acute changes.  Care discussed with Dr. Dalene Seltzer   2:52 PM Dr. Dalene Seltzer performed bedside US and felt that there is a small collection of fluid.  She attempted to perform needle I&D without any discharge.  Since there are no surrounding skin erythema and no obvious discharge we decided not to start abx.  Recommend warm compress to affected region several times daily.  Pt to have a breast US outpt to r/o malignancy and to further assess the painful lesion.  Pt stable for discharge.  Strict return precaution given.     Fayrene Helper, PA-C 09/30/16 1456    Alvira Monday, MD 09/30/16 [phone]

## 2016-09-30 NOTE — ED Notes (Signed)
Pt 184/118 first attempt, recheck 189/101. Pt sts he took his bp meds this AM. Hx of HBP

## 2016-10-01 ENCOUNTER — Other Ambulatory Visit: Payer: Self-pay | Admitting: Family Medicine

## 2016-10-01 DIAGNOSIS — N644 Mastodynia: Secondary | ICD-10-CM

## 2016-10-05 ENCOUNTER — Ambulatory Visit
Admission: RE | Admit: 2016-10-05 | Discharge: 2016-10-05 | Disposition: A | Discharge status: Home / Self Care | Payer: Non-veteran care | Source: Ambulatory Visit | Attending: Family Medicine | Admitting: Family Medicine

## 2016-10-05 DIAGNOSIS — N644 Mastodynia: Secondary | ICD-10-CM

## 2016-10-22 NOTE — Addendum Note (Signed)
Addended by: Burton ApleyPETTY, Ihan Pat A on: 10/22/2016 02:22 PM   Modules accepted: Orders

## 2017-01-18 ENCOUNTER — Emergency Department (HOSPITAL_COMMUNITY)
Admission: EM | Admit: 2017-01-18 | Discharge: 2017-01-19 | Disposition: A | Discharge status: Home / Self Care | Payer: Medicare Other | Attending: Emergency Medicine | Admitting: Emergency Medicine

## 2017-01-18 ENCOUNTER — Encounter (HOSPITAL_COMMUNITY): Payer: Self-pay | Admitting: Family Medicine

## 2017-01-18 DIAGNOSIS — I2510 Atherosclerotic heart disease of native coronary artery without angina pectoris: Secondary | ICD-10-CM | POA: Diagnosis not present

## 2017-01-18 DIAGNOSIS — R1032 Left lower quadrant pain: Secondary | ICD-10-CM | POA: Diagnosis present

## 2017-01-18 DIAGNOSIS — Z955 Presence of coronary angioplasty implant and graft: Secondary | ICD-10-CM | POA: Diagnosis not present

## 2017-01-18 DIAGNOSIS — Z7982 Long term (current) use of aspirin: Secondary | ICD-10-CM | POA: Insufficient documentation

## 2017-01-18 DIAGNOSIS — Z951 Presence of aortocoronary bypass graft: Secondary | ICD-10-CM | POA: Insufficient documentation

## 2017-01-18 DIAGNOSIS — I5042 Chronic combined systolic (congestive) and diastolic (congestive) heart failure: Secondary | ICD-10-CM | POA: Insufficient documentation

## 2017-01-18 DIAGNOSIS — F17210 Nicotine dependence, cigarettes, uncomplicated: Secondary | ICD-10-CM | POA: Diagnosis not present

## 2017-01-18 DIAGNOSIS — Z79899 Other long term (current) drug therapy: Secondary | ICD-10-CM | POA: Diagnosis not present

## 2017-01-18 DIAGNOSIS — J449 Chronic obstructive pulmonary disease, unspecified: Secondary | ICD-10-CM | POA: Insufficient documentation

## 2017-01-18 DIAGNOSIS — Z8673 Personal history of transient ischemic attack (TIA), and cerebral infarction without residual deficits: Secondary | ICD-10-CM | POA: Insufficient documentation

## 2017-01-18 DIAGNOSIS — E039 Hypothyroidism, unspecified: Secondary | ICD-10-CM | POA: Insufficient documentation

## 2017-01-18 DIAGNOSIS — I130 Hypertensive heart and chronic kidney disease with heart failure and stage 1 through stage 4 chronic kidney disease, or unspecified chronic kidney disease: Secondary | ICD-10-CM | POA: Insufficient documentation

## 2017-01-18 DIAGNOSIS — I951 Orthostatic hypotension: Secondary | ICD-10-CM

## 2017-01-18 DIAGNOSIS — I25119 Atherosclerotic heart disease of native coronary artery with unspecified angina pectoris: Secondary | ICD-10-CM

## 2017-01-18 DIAGNOSIS — N183 Chronic kidney disease, stage 3 (moderate): Secondary | ICD-10-CM | POA: Diagnosis not present

## 2017-01-18 DIAGNOSIS — Z950 Presence of cardiac pacemaker: Secondary | ICD-10-CM

## 2017-01-18 DIAGNOSIS — I10 Essential (primary) hypertension: Secondary | ICD-10-CM

## 2017-01-18 DIAGNOSIS — R109 Unspecified abdominal pain: Secondary | ICD-10-CM

## 2017-01-18 DIAGNOSIS — G8929 Other chronic pain: Secondary | ICD-10-CM

## 2017-01-18 LAB — URINALYSIS, ROUTINE W REFLEX MICROSCOPIC
BILIRUBIN URINE: NEGATIVE
Glucose, UA: NEGATIVE mg/dL
Ketones, ur: NEGATIVE mg/dL
Leukocytes, UA: NEGATIVE
Nitrite: NEGATIVE
Protein, ur: 100 mg/dL — AB
SPECIFIC GRAVITY, URINE: 1.014 (ref 1.005–1.030)
SQUAMOUS EPITHELIAL / LPF: NONE SEEN
pH: 5 (ref 5.0–8.0)

## 2017-01-18 LAB — COMPREHENSIVE METABOLIC PANEL
ALK PHOS: 92 U/L (ref 38–126)
ALT: 10 U/L — AB (ref 17–63)
AST: 20 U/L (ref 15–41)
Albumin: 3.5 g/dL (ref 3.5–5.0)
Anion gap: 7 (ref 5–15)
BILIRUBIN TOTAL: 0.6 mg/dL (ref 0.3–1.2)
BUN: 27 mg/dL — AB (ref 6–20)
CALCIUM: 8.7 mg/dL — AB (ref 8.9–10.3)
CHLORIDE: 108 mmol/L (ref 101–111)
CO2: 28 mmol/L (ref 22–32)
CREATININE: 1.7 mg/dL — AB (ref 0.61–1.24)
GFR calc Af Amer: 40 mL/min — ABNORMAL LOW (ref >60)
GFR calc non Af Amer: 34 mL/min — ABNORMAL LOW (ref >60)
Glucose, Bld: 106 mg/dL — ABNORMAL HIGH (ref 65–99)
Potassium: 3.8 mmol/L (ref 3.5–5.1)
Sodium: 143 mmol/L (ref 135–145)
Total Protein: 6.9 g/dL (ref 6.5–8.1)

## 2017-01-18 LAB — I-STAT TROPONIN, ED: TROPONIN I, POC: 0.15 ng/mL — AB (ref 0.00–0.08)

## 2017-01-18 LAB — LIPASE, BLOOD: LIPASE: 17 U/L (ref 11–51)

## 2017-01-18 MED ORDER — HYDRALAZINE HCL 25 MG PO TABS
25.0000 mg | ORAL_TABLET | Freq: Once | ORAL | Status: AC
  Administered 2017-01-18: 25 mg via ORAL
  Filled 2017-01-18: qty 1

## 2017-01-18 NOTE — ED Triage Notes (Signed)
Patient is complaining of left lower abd pain that radiates left, mid back. Pain started 2 months ago but got worse today. [facility] [place] Pt reports his BP was high. Patient reports he felt his normal self. This afternoon when getting up he felt dizzy, took BP again and it was high. Pt denies feeling dizzy while sitting up if he stands up, he gets dizzy.

## 2017-01-18 NOTE — ED Notes (Signed)
Bed: [id number] Expected date:  Expected time:  Means of arrival:  Comments: Held triage

## 2017-01-18 NOTE — ED Provider Notes (Signed)
[facility]-EMERGENCY DEPT Provider Note   CSN: [id number] Arrival date & time: 01/18/17  2004    History   Chief Complaint Chief Complaint  Patient presents with  . Abdominal Pain    HPI [name] is a 83 y.o. male.  83 year old male with history of hypertension, coronary artery disease, TIA/CVA, HLD, AAA, and CHF presents to the emergency department for evaluation of abdominal pain. He states that he has had pain in his left lower abdomen which travels to his mid back. This pain is chronic, though patient reports it worsening today. He has also had high blood pressure. The family expresses concern about this. The patient, himself, states that he has been out of one of his blood pressure medications over the past 4 days. He reports that he has a follow-up appointment soon to have this medication refilled. The patient has had no recent fevers. No dizziness, lightheadedness, or syncope. No vomiting, melena, or hematochezia. The patient is accompanied by his daughters.      Past Medical History:  Diagnosis Date  . AAA (abdominal aortic aneurysm) (HCC)    Has a known AAA of 3.2 x 3.4 cm by abdominal untrasound 03/27/12. Followup abdominal ultrasound on 03/29/13 showed a diameter of 3.6 cm, which is stable  . Abdominal bruit    Loud  . CAD (coronary artery disease)    a. s/p MI in 1988;  b. s/p CABG in 2000 (Dr. Barry Dienes) x 4, LIMA to LAD, SVG to diagonal, SVG to [facility], SVG to PDA;  c. 12/2013 NSTEMI/abnl MV;  d. 01/2014 Cath/PCI: native 3VD, VG->RCA 100, VG->OM aneurysmal 40ost/m, 62m(5.0x12 Veriflex BMS), VG->Diag aneurysmal, LIMA->LAD nl.  . Chronic systolic CHF (congestive heart failure) (HCC)    Hospitalization for CHF from 05/04/13-05/06/13. Treated medically. Had possible pneumonia with short course of antibiotics.  Marland Kitchen COPD (chronic obstructive pulmonary disease) (HCC)   . CVA (cerebral infarction) 07/2011   Remote left brain infarct with sensory deficits and had a left internal carotid stent  placed by Dr. [name] at that time, no recurrent CVA or TIAs or CNS events.  . Headache   . Hyperlipidemia   . Hypertension   . Hypothyroid    On supplement "don't know if I take RX or not for my thyroid" (01/16/2014)  . Orthostatic hypotension   . PAD (peripheral artery disease) (HCC)    Stable. Had a past RFPBG by Dr. [name] in 2009. this graft is occluded with reconstitution in the popliteal artery, which was patent. Right tibial was occluded and 2-vessel runoff on angiography done by [name] in 12/2010.  Marland Kitchen Peripheral vascular disease (HCC)   . Pneumonia   . Presence of permanent cardiac pacemaker   . Sick sinus syndrome (HCC)    PPM implanted for this and syncope   . Stroke (HCC)   . Syncope    tilt table test/8.10.01/orthostatic hypotension  . TIA (transient ischemic attack)     Patient Active Problem List   Diagnosis Date Noted  . BP (high blood pressure) 03/28/2016  . Encounter for follow-up examination after completed treatment for conditions other than malignant neoplasm 03/28/2016  . Thoracic aortic aneurysm without rupture (HCC) 03/15/2016  . Coronary artery disease due to lipid rich plaque   . Chest pain   . Abdominal aortic aneurysm (AAA) without rupture (HCC)   . Hypertensive emergency   . Acute on chronic systolic CHF (congestive heart failure) (HCC)   . Personal history of other medical treatment 02/11/2016  . Left sided  abdominal pain 02/11/2016  . Nephropyelitis 02/11/2016  . Abdominal aortic aneurysm (AAA) (HCC) 02/09/2016  . Anxiety 02/09/2016  . Arterial vascular disease 02/09/2016  . Atrial fibrillation (HCC) 02/09/2016  . Benign fibroma of prostate 02/09/2016  . Edema leg 02/09/2016  . Arteriosclerosis of autologous arterial coronary artery bypass graft 02/09/2016  . Chronic kidney disease 02/09/2016  . Congestive heart failure (HCC) 02/09/2016  . Diaphragmatic hernia 02/09/2016  . Hemorrhagic cerebrovascular accident (CVA) (HCC) 02/09/2016  .  Peptic ulcer without hemorrhage or perforation 02/09/2016  . Arteriosclerosis of artery of extremity (HCC) 02/09/2016  . Peripheral vascular disease (HCC) 02/09/2016  . Abdominal pain 01/29/2016  . Nausea 01/29/2016  . UTI (urinary tract infection) 01/29/2016  . Right leg pain 01/29/2016  . AP (abdominal pain)   . Hematuria 07/04/2015  . Faintness   . Post-operative complication 07/03/2015  . Postoperative anemia due to acute blood loss 07/03/2015  . Anemia due to blood loss   . Gross hematuria   . H/O endarterectomy   . BPH (benign prostatic hyperplasia) 06/30/2015  . PAD (peripheral artery disease) (HCC) 06/10/2015  . Elevated troponin 05/13/2015  . Nontraumatic cortical hemorrhage of cerebral hemisphere (HCC) 01/31/2015  . Essential hypertension 01/31/2015  . Orthostatic hypotension 01/31/2015  . HLD (hyperlipidemia) 01/31/2015  . Coronary artery disease involving native coronary artery of native heart with angina pectoris (HCC) 01/31/2015  . PVD (peripheral vascular disease) (HCC) 01/31/2015  . Cardiac pacemaker in situ 01/31/2015  . Artificial cardiac pacemaker 01/31/2015  . Right sided weakness   . Stroke (HCC)   . ICH (intracerebral hemorrhage) (HCC) 11/17/2014  . CAP (community acquired pneumonia) 06/09/2014  . Acute renal failure superimposed on stage 3 chronic kidney disease (HCC) 06/09/2014  . CHF (congestive heart failure) (HCC) 06/09/2014  . Acute-on-chronic renal failure (HCC) 06/09/2014  . Unstable angina (HCC) 01/16/2014  . COPD with acute exacerbation (HCC) 01/04/2014  . Influenza with respiratory manifestations 01/02/2014  . Acute on chronic kidney failure (HCC) 01/02/2014  . Leukocytosis, unspecified 01/02/2014  . Malnutrition of moderate degree (HCC) 01/01/2014  . Acute bronchitis 12/30/2013  . Chronic combined systolic and diastolic CHF (congestive heart failure) (HCC) 12/30/2013  . NSTEMI (non-ST elevated myocardial infarction) (HCC) 12/30/2013  . CAD  (coronary artery disease) of artery bypass graft 12/13/2013  . Sick sinus syndrome [facility]) s/p [employer] PPM 12/13/2013  . Acute on chronic combined systolic and diastolic CHF (congestive heart failure) (HCC) 05/04/2013  . HTN (hypertension) 05/04/2013  . Hyperlipidemia 05/04/2013  . Tobacco abuse 05/04/2013  . History of CVA (cerebrovascular accident) 05/04/2013  . Thrombocytopenia (HCC) 05/04/2013  . Acute on chronic combined systolic and diastolic congestive heart failure (HCC) 05/04/2013  . Cerebrovascular accident, old 05/04/2013  . Colon polyp 04/19/2012  . Occlusion and stenosis of carotid artery without mention of cerebral infarction 02/07/2012  . Acquired involutional ptosis of eyelid 01/06/2012  . Entropion and trichiasis of eyelid 01/06/2012    Past Surgical History:  Procedure Laterality Date  . ABDOMINAL AORTAGRAM N/A 02/22/2012   Procedure: ABDOMINAL Ronny Flurry;  Surgeon: Nada Libman, MD;  Location: [facility] CATH LAB;  Service: Cardiovascular;  Laterality: N/A;  . abdominal aortagram    . CARDIAC CATHETERIZATION  2000  . CAROTID ANGIOGRAM Bilateral 02/22/2012   Procedure: CAROTID ANGIOGRAM;  Surgeon: Nada Libman, MD;  Location: [facility] CATH LAB;  Service: Cardiovascular;  Laterality: Bilateral;  . CAROTID STENT Left 06/23/11   By [name]  . CATARACT EXTRACTION W/ INTRAOCULAR LENS  IMPLANT, BILATERAL  Bilateral   . COLONOSCOPY    . CORONARY ANGIOPLASTY WITH STENT PLACEMENT  2001-01/16/2014   "think today makes #4" (01/16/2014)  . CORONARY ARTERY BYPASS GRAFT  2000   1st in 2000 (Dr. Barry Dienes) x 4, LIMA to LAD, SVG to diagonal, SVG to OM4, SVG to PDA. Re-cath in May 2012 with patent LIMA to LAD, patent SVG to diagonal, patent SVG to OM with left-to-right collaterals to an occluded right.  Marland Kitchen ENDARTERECTOMY FEMORAL Right 06/25/2015   Procedure: ENDARTERECTOMY RIGHT FEMORAL ARTERY ;  Surgeon: Larina Earthly, MD;  Location: [facility] OR;  Service: Vascular;  Laterality: Right;  . FEMORAL BYPASS  Right ?2001  . INGUINAL HERNIA REPAIR Right 1980's  . INSERT / REPLACE / REMOVE PACEMAKER     implanted 2003 by Dr Jenne Campus with gen change (MDT [id number]) 08/2010 by [facility]  . LEFT HEART CATHETERIZATION WITH CORONARY/GRAFT ANGIOGRAM N/A 01/16/2014   Procedure: LEFT HEART CATHETERIZATION WITH Isabel Caprice;  Surgeon: Kathleene Hazel, MD;  Location: [facility] CATH LAB;  Service: Cardiovascular;  Laterality: N/A;  . PATCH ANGIOPLASTY Right 06/25/2015   Procedure: RIGHT COMMON FEMORAL ARTERY AND PROFUNDA PATCH ANGIOPLASTY USING HEMASHIELD PLATINUM FINESSE PATCH ;  Surgeon: Larina Earthly, MD;  Location: [facility] OR;  Service: Vascular;  Laterality: Right;  . PERIPHERAL VASCULAR CATHETERIZATION N/A 06/24/2015   Procedure: Abdominal Aortogram;  Surgeon: Nada Libman, MD;  Location: [facility] INVASIVE CV LAB;  Service: Cardiovascular;  Laterality: N/A;       Home Medications    Prior to Admission medications   Medication Sig Start Date End Date Taking? Authorizing Provider  acetaminophen (TYLENOL) 325 MG tablet Take 650 mg by mouth every 6 (six) hours as needed for mild pain. Reported on 11/25/2015   Yes Historical Provider, MD  amLODipine (NORVASC) 10 MG tablet Take 10 mg by mouth daily.   Yes Historical Provider, MD  aspirin 81 MG tablet Take 81 mg by mouth daily.   Yes Historical Provider, MD  atorvastatin (LIPITOR) 40 MG tablet Take 1 tablet (40 mg total) by mouth daily at 6 PM. 05/14/15  Yes Brittainy Sherlynn Carbon, PA-C  isosorbide mononitrate (IMDUR) 60 MG 24 hr tablet Take 90 mg by mouth daily.   Yes Historical Provider, MD  lisinopril (PRINIVIL,ZESTRIL) 5 MG tablet Take 5 mg by mouth daily.   Yes Historical Provider, MD  mirtazapine (REMERON) 15 MG tablet Take 15 mg by mouth at bedtime.   Yes Historical Provider, MD  nitroGLYCERIN (NITROSTAT) 0.4 MG SL tablet Place 1 tablet (0.4 mg total) under the tongue every 5 (five) minutes as needed. For chest pain 10/27/15  Yes Lars Masson, MD  Tamsulosin HCl  (FLOMAX) 0.4 MG CAPS Take 0.4 mg by mouth daily after supper.    Yes Historical Provider, MD  acetaminophen-codeine (TYLENOL #3) 300-30 MG tablet Take 1-2 tablets by mouth every 6 (six) hours as needed for moderate pain. Patient not taking: Reported on 01/18/2017 09/30/16   Fayrene Helper, PA-C  hydrALAZINE (APRESOLINE) 25 MG tablet Take 1 tablet (25 mg total) by mouth 3 (three) times daily. 01/19/17 01/29/17  Antony Madura, PA-C  potassium chloride (K-DUR) 10 MEQ tablet Take 1 tablet (10 mEq total) by mouth daily. Patient not taking: Reported on 01/18/2017 02/26/16   Lars Masson, MD    Family History Family History  Problem Relation Age of Onset  . Cancer Brother 60    oral   . Prostate cancer Brother   . Hypertension Brother   .  Hyperlipidemia Brother   . Lung disease Father     black lung disease    Social History Social History  Substance Use Topics  . Smoking status: Current Every Day Smoker    Packs/day: 0.50    Years: 68.00    Types: Cigarettes    Last attempt to quit: 05/02/2015  . Smokeless tobacco: Never Used  . Alcohol use No     Allergies   Tramadol; Pravachol; Pravastatin; Simvastatin; Other; Oxycodone; and Terazosin   Review of Systems Review of Systems Ten systems reviewed and are negative for acute change, except as noted in the HPI.    Physical Exam Updated Vital Signs BP 178/87 (BP Location: Left Arm)   Pulse 60   Temp 98 F (36.7 C) (Oral)   Resp 18   Ht 5\' 9"  (1.753 m)   Wt 65.2 kg   SpO2 95%   BMI 21.24 kg/m   Physical Exam  Constitutional: He is oriented to person, place, and time. He appears well-developed and well-nourished. No distress.  Nontoxic and in NAD  HENT:  Head: Normocephalic and atraumatic.  Mouth/Throat: Oropharynx is clear and moist.  Eyes: Conjunctivae and EOM are normal. No scleral icterus.  Neck: Normal range of motion.  No meningismus  Cardiovascular: Normal rate, regular rhythm and intact distal pulses.     Pulmonary/Chest: Effort normal. No respiratory distress. He has no wheezes. He has no rales.  Respirations even and unlabored  Abdominal: Soft. He exhibits no distension and no mass. There is tenderness. There is no guarding.  No focal tenderness noted on exam. No distention, masses, or peritoneal signs.  Musculoskeletal: Normal range of motion.  No pitting edema in BLE  Neurological: He is alert and oriented to person, place, and time. He exhibits normal muscle tone. Coordination normal.  GCS 15. Speech is goal oriented. No focal deficits noted.  Skin: Skin is warm and dry. No rash noted. He is not diaphoretic. No erythema. No pallor.  Psychiatric: He has a normal mood and affect. His behavior is normal.  Nursing note and vitals reviewed.    ED Treatments / Results  Labs (all labs ordered are listed, but only abnormal results are displayed) Labs Reviewed  CBC WITH DIFFERENTIAL/PLATELET - Abnormal; Notable for the following:       Result Value   Platelets 106 (*)    All other components within normal limits  COMPREHENSIVE METABOLIC PANEL - Abnormal; Notable for the following:    Glucose, Bld 106 (*)    BUN 27 (*)    Creatinine, Ser 1.70 (*)    Calcium 8.7 (*)    ALT 10 (*)    GFR calc non Af Amer 34 (*)    GFR calc Af Amer 40 (*)    All other components within normal limits  URINALYSIS, ROUTINE W REFLEX MICROSCOPIC - Abnormal; Notable for the following:    Hgb urine dipstick LARGE (*)    Protein, ur 100 (*)    Bacteria, UA FEW (*)    All other components within normal limits  I-STAT TROPOININ, ED - Abnormal; Notable for the following:    Troponin i, poc 0.15 (*)    All other components within normal limits  LIPASE, BLOOD    EKG  EKG Interpretation  Date/Time:  Tuesday January 18 2017 23:30:26 EST Ventricular Rate:  60 PR Interval:    QRS Duration: 112 QT Interval:  479 QTC Calculation: 479 R Axis:   -54 Text Interpretation:  Atrial-paced complexes  Multiform  ventricular premature complexes Prolonged PR interval LAD, consider left anterior fascicular block LVH with secondary repolarization abnormality Borderline prolonged QT interval Confirmed by Freida Busman  MD, ANTHONY ([id number]) on 01/19/2017 12:48:29 AM       Radiology Ct Angio Chest/abd/pel For Dissection W And/or Wo Contrast  Result Date: 01/19/2017 CLINICAL DATA:  Chronic left thoracic, left lower quadrant and left-sided back pain. High blood pressure. Elevated troponin. Initial encounter. EXAM: CT ANGIOGRAPHY CHEST, ABDOMEN AND PELVIS TECHNIQUE: Multidetector CT imaging through the chest, abdomen and pelvis was performed using the standard protocol during bolus administration of intravenous contrast. Multiplanar reconstructed images and MIPs were obtained and reviewed to evaluate the vascular anatomy. CONTRAST:  80 mL of Isovue 370 IV contrast COMPARISON:  CTA of the chest performed 03/11/2016, and CTA of the abdomen and pelvis performed 01/28/2016 FINDINGS: CTA CHEST FINDINGS Cardiovascular: Aneurysmal dilatation of the descending thoracic aorta appears mildly increased from 2017 at several levels, though it still measures approximately 5.5 cm in maximal dimension. Underlying mural thrombus is similar in appearance to the prior study. Scattered calcification is seen along the thoracic aorta and proximal great vessels. The heart is borderline normal in size. A pacemaker is noted at the left chest wall, with leads ending at the right atrium and right ventricle. Mediastinum/Nodes: The patient is status post median sternotomy. No definite mediastinal lymphadenopathy is seen. No pericardial effusion is identified. Scattered hypodensities within the thyroid gland are likely benign given their size. No axillary lymphadenopathy is seen. Lungs/Pleura: Trace bilateral pleural effusions are improved from 2017. There is no evidence for hemothorax, given the attenuation of the pleural effusions. Scattered bilateral emphysema  is noted, most prominent at the upper lung lobes. Mild fibrotic change is noted at the lung bases. No pneumothorax is seen. No dominant mass is identified. Musculoskeletal: No acute osseous abnormalities are identified. The visualized musculature is unremarkable in appearance. Review of the MIP images confirms the above findings. CTA ABDOMEN AND PELVIS FINDINGS VASCULAR Aorta: Aneurysmal dilatation of the infrarenal abdominal aorta is mildly worsened from the prior study, measuring approximately 4.4 cm in AP and transverse dimensions. Associated mural thrombus is similar in appearance. Scattered calcification is noted along the abdominal aorta. Aneurysmal dilatation along the proximal abdominal aorta resolves about the level of the celiac trunk. There is tortuosity of the proximal abdominal aorta. Celiac / SMA: The patient appears to have a combined celiac trunk and superior mesenteric artery, which remains patent. There is mild luminal narrowing along the trunk just proximal to its bifurcation, with surrounding calcification. There is a significant narrowing at the proximal aspect of a small accessory artery supplying the stomach and left hepatic lobe. Renals: Scattered calcification is noted at the proximal renal arteries bilaterally, though they remain grossly patent. There is likely significant narrowing at the origin of the right renal artery. IMA: There is enhancement of the inferior mesenteric artery, though this arises at the level of thrombus at the infrarenal abdominal aortic aneurysm. This may reflect reconstitution via collateral vessels. Inflow: There is slightly increased aneurysmal dilatation of the left common iliac artery, measuring 4.0 cm in diameter, with associated mural thrombus. Mild mural thrombus is noted along the mildly dilated right common iliac artery. A mostly thrombosed 2.7 cm aneurysm is seen at the right internal iliac artery. Scattered calcification and mild luminal narrowing are  seen along the external iliac arteries bilaterally. Mild calcification is noted along the left common femoral artery. There is suggestion of moderate to severe luminal narrowing  at the origin of the right profunda femoris artery. Veins: Visualized venous structures are grossly unremarkable. The inferior vena cava is unremarkable in appearance. Review of the MIP images confirms the above findings. NON-VASCULAR Hepatobiliary: The liver is unremarkable in appearance. The gallbladder is unremarkable in appearance. The common bile duct remains normal in caliber. Pancreas: The pancreas is within normal limits. Spleen: The spleen is unremarkable in appearance. Adrenals/Urinary Tract: A 2.7 cm right adrenal adenoma is noted. The left adrenal gland is grossly unremarkable in appearance. Mild bilateral renal atrophy is noted, with mild bilateral renal scarring. There is no evidence of hydronephrosis. No renal or ureteral stones are identified. Nonspecific perinephric stranding is noted bilaterally. Stomach/Bowel: Mild wall thickening is suggested along the gastric fundus. Would correlate for any evidence of gastritis. The small bowel is within normal limits. The appendix is normal in caliber, without evidence of appendicitis. The colon is unremarkable in appearance. Lymphatic: No retroperitoneal or pelvic sidewall lymphadenopathy is seen. Reproductive: The bladder is mildly distended and grossly unremarkable. The prostate is mildly enlarged, measuring 5.2 cm in transverse dimension. Other: No additional soft tissue abnormalities are seen. Musculoskeletal: No acute osseous abnormalities are identified. Facet disease is noted along the lumbar spine, with a chronic left-sided pars defect at L5. The visualized musculature is unremarkable in appearance. Review of the MIP images confirms the above findings. IMPRESSION: 1. Mildly increased aneurysmal dilatation of the descending thoracic aorta, with associated mural thrombus, though  it still measures approximately 5.5 cm in maximal dimension, extending to the proximal abdominal aorta. Aneurysmal dilatation of the infrarenal abdominal aorta is also mildly worsened from the prior study, measuring 4.4 cm in AP and transverse dimensions. Slightly increased aneurysmal dilatation of the left common iliac artery, measuring 4.0 cm. 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 Recommend followup by abdomen and pelvis CTA in 3-6 months, and vascular surgery referral/consultation if not already obtained. This recommendation follows ACR consensus guidelines: White Paper of the ACR Incidental Findings Committee II on Vascular Findings. J Am Coll Radiol 2013; 10:789-794. 2. Mild wall thickening suggested about the gastric fundus. Would correlate for any evidence of gastritis. 3. Trace bilateral pleural effusions, improved from 2017. 4. Scattered bilateral emphysema. Mild fibrotic change at the lung bases. 5. Mild luminal narrowing along the celiac trunk just proximal to its bifurcation; combined celiac trunk and superior mesenteric artery noted. Significant narrowing at the proximal aspect of a small accessory artery supplying the stomach and left hepatic lobe. Likely significant narrowing at the origin of the right renal artery. Suggestion of moderate to severe luminal narrowing at the origin of the right profunda femoris artery. 6. 2.7 cm right adrenal adenoma noted. 7. Mild bilateral atrophy and mild bilateral renal scarring. 8. Mildly enlarged prostate noted. 9. Chronic left-sided pars defect at L5. Electronically Signed   By: Roanna RaiderJeffery  Chang M.D.   On: 01/19/2017 02:37    Procedures Procedures (including critical care time)  Medications Ordered in ED Medications  sodium chloride 0.9 % injection (not administered)  hydrALAZINE (APRESOLINE) tablet 25 mg (25 mg Oral Given 01/18/17 2302)  iopamidol (ISOVUE-370) 76 % injection (80 mLs  Contrast Given 01/19/17 0127)      Initial Impression / Assessment and Plan / ED Course  I have reviewed the triage vital signs and the nursing notes.  Pertinent labs & imaging results that were available during my care of the patient were reviewed by me and considered in my medical decision making (see chart  for details).  Clinical Course as of Jan 19 605  Wed Jan 19, 2017  0355 CT Angio Chest/Abd/Pel for Dissection W and/or Wo Contrast [KH]    Clinical Course User Index [KH] Antony Madura, PA-C    83 year old male presents to the emergency department for evaluation of abdominal pain. Per history, this pain is fairly chronic. Patient reports some worsening today, though his exam is reassuring. His vitals have been stable since arrival. The patient is noted to be hypertensive, but reports running out of one of his blood pressure medications.   Given his age and history of AAA, a CT was performed. This shows mild increase of aortic aneurysms. No evidence of rupture. Laboratory workup is reassuring, consistent with baseline. This is also true for the patient's troponin which is chronically elevated.  Given chronicity of symptoms and reassuring workup, I do not believe further inpatient management is indicated. The patient has been given a refill for his blood pressure medication until he is able to follow-up with his doctor. Return precautions discussed and provided. Patient discharged in stable condition. Patient and family with no unaddressed concerns.   Final Clinical Impressions(s) / ED Diagnoses   Final diagnoses:  Chronic abdominal pain  Hypertension not at goal    New Prescriptions Discharge Medication List as of 01/19/2017  3:57 AM       Antony Madura, PA-C 02/03/17 2344

## 2017-01-18 NOTE — ED Notes (Signed)
Pt states that he has been having pain to the left thoraic, left lower abdomen, and htn.

## 2017-01-19 ENCOUNTER — Emergency Department (HOSPITAL_COMMUNITY): Payer: Medicare Other

## 2017-01-19 ENCOUNTER — Encounter (HOSPITAL_COMMUNITY): Payer: Self-pay

## 2017-01-19 LAB — CBC WITH DIFFERENTIAL/PLATELET
BASOS ABS: 0.1 10*3/uL (ref 0.0–0.1)
Basophils Relative: 1 %
EOS ABS: 0.2 10*3/uL (ref 0.0–0.7)
Eosinophils Relative: 2 %
HCT: 40.1 % (ref 39.0–52.0)
Hemoglobin: 13.8 g/dL (ref 13.0–17.0)
LYMPHS ABS: 3.6 10*3/uL (ref 0.7–4.0)
Lymphocytes Relative: 36 %
MCH: 32.7 pg (ref 26.0–34.0)
MCHC: 34.4 g/dL (ref 30.0–36.0)
MCV: 95 fL (ref 78.0–100.0)
Monocytes Absolute: 0.6 10*3/uL (ref 0.1–1.0)
Monocytes Relative: 6 %
NEUTROS ABS: 5.6 10*3/uL (ref 1.7–7.7)
Neutrophils Relative %: 55 %
PLATELETS: 106 10*3/uL — AB (ref 150–400)
RBC: 4.22 MIL/uL (ref 4.22–5.81)
RDW: 14.7 % (ref 11.5–15.5)
WBC: 10.1 10*3/uL (ref 4.0–10.5)

## 2017-01-19 MED ORDER — IOPAMIDOL (ISOVUE-370) INJECTION 76%
INTRAVENOUS | Status: AC
  Administered 2017-01-19: 80 mL
  Filled 2017-01-19: qty 100

## 2017-01-19 MED ORDER — HYDRALAZINE HCL 25 MG PO TABS: 25.0000 mg | ORAL_TABLET | Freq: Three times a day (TID) | ORAL | 0 refills | Status: DC

## 2017-01-19 MED ORDER — SODIUM CHLORIDE 0.9 % IJ SOLN
INTRAMUSCULAR | Status: AC
  Filled 2017-01-19: qty 50

## 2017-01-19 NOTE — Discharge Instructions (Signed)
Take hydralazine as prescribed. Follow-up with your primary care doctor for a recheck of blood pressure. You may also follow-up with your cardiologist, if desired. We recommend that you see a vascular surgeon for further evaluation of your aneurysms. If you do not already have a vascular surgeon, have your primary care doctor refer you to one. Your known aneurysms appear fairly stable compared with 8 months ago. Return to the ED for new or worsening symptoms.

## 2017-01-19 NOTE — ED Provider Notes (Signed)
Medical screening examination/treatment/procedure(s) were conducted as a shared visit with non-physician practitioner(s) and myself.  I personally evaluated the patient during the encounter.   EKG Interpretation  Date/Time:  Tuesday January 18 2017 23:30:26 EST Ventricular Rate:  60 PR Interval:    QRS Duration: 112 QT Interval:  479 QTC Calculation: 479 R Axis:   -54 Text Interpretation:  Atrial-paced complexes Multiform ventricular premature complexes Prolonged PR interval LAD, consider left anterior fascicular block LVH with secondary repolarization abnormality Borderline prolonged QT interval Confirmed by Freida BusmanALLEN  MD, Yarnell Kozloski ([id number]) on 01/19/2017 12:48:29 AM     Patient here complaining of chronic abdominal pain times several months. Has had issues with his blood pressure. Denies any syncope. Denies any hematuria or dysuria. Abdominal exam is benign. Does have hematuria. Abdominal CT pending.   Lorre NickAnthony Allsion Nogales, MD 01/19/17 [phone]

## 2017-05-10 ENCOUNTER — Encounter (HOSPITAL_COMMUNITY): Payer: Self-pay | Admitting: Emergency Medicine

## 2017-05-10 ENCOUNTER — Emergency Department (HOSPITAL_COMMUNITY): Payer: Medicare Other

## 2017-05-10 ENCOUNTER — Emergency Department (HOSPITAL_COMMUNITY)
Admission: EM | Admit: 2017-05-10 | Discharge: 2017-05-10 | Disposition: A | Discharge status: Home / Self Care | Payer: Medicare Other | Attending: Emergency Medicine | Admitting: Emergency Medicine

## 2017-05-10 DIAGNOSIS — Z951 Presence of aortocoronary bypass graft: Secondary | ICD-10-CM | POA: Diagnosis not present

## 2017-05-10 DIAGNOSIS — J449 Chronic obstructive pulmonary disease, unspecified: Secondary | ICD-10-CM | POA: Insufficient documentation

## 2017-05-10 DIAGNOSIS — F17210 Nicotine dependence, cigarettes, uncomplicated: Secondary | ICD-10-CM | POA: Diagnosis not present

## 2017-05-10 DIAGNOSIS — I130 Hypertensive heart and chronic kidney disease with heart failure and stage 1 through stage 4 chronic kidney disease, or unspecified chronic kidney disease: Secondary | ICD-10-CM | POA: Insufficient documentation

## 2017-05-10 DIAGNOSIS — M5441 Lumbago with sciatica, right side: Secondary | ICD-10-CM | POA: Diagnosis not present

## 2017-05-10 DIAGNOSIS — Z8673 Personal history of transient ischemic attack (TIA), and cerebral infarction without residual deficits: Secondary | ICD-10-CM | POA: Insufficient documentation

## 2017-05-10 DIAGNOSIS — I5043 Acute on chronic combined systolic (congestive) and diastolic (congestive) heart failure: Secondary | ICD-10-CM | POA: Diagnosis not present

## 2017-05-10 DIAGNOSIS — Z955 Presence of coronary angioplasty implant and graft: Secondary | ICD-10-CM | POA: Diagnosis not present

## 2017-05-10 DIAGNOSIS — I2510 Atherosclerotic heart disease of native coronary artery without angina pectoris: Secondary | ICD-10-CM | POA: Insufficient documentation

## 2017-05-10 DIAGNOSIS — Z79899 Other long term (current) drug therapy: Secondary | ICD-10-CM | POA: Insufficient documentation

## 2017-05-10 DIAGNOSIS — Z7982 Long term (current) use of aspirin: Secondary | ICD-10-CM | POA: Insufficient documentation

## 2017-05-10 DIAGNOSIS — I252 Old myocardial infarction: Secondary | ICD-10-CM | POA: Insufficient documentation

## 2017-05-10 DIAGNOSIS — N183 Chronic kidney disease, stage 3 (moderate): Secondary | ICD-10-CM | POA: Insufficient documentation

## 2017-05-10 DIAGNOSIS — E039 Hypothyroidism, unspecified: Secondary | ICD-10-CM | POA: Diagnosis not present

## 2017-05-10 DIAGNOSIS — M545 Low back pain: Secondary | ICD-10-CM | POA: Diagnosis present

## 2017-05-10 LAB — CBC WITH DIFFERENTIAL/PLATELET
Basophils Absolute: 0 10*3/uL (ref 0.0–0.1)
Basophils Relative: 0 %
EOS ABS: 0.1 10*3/uL (ref 0.0–0.7)
Eosinophils Relative: 1 %
HCT: 42.3 % (ref 39.0–52.0)
HEMOGLOBIN: 14.5 g/dL (ref 13.0–17.0)
LYMPHS ABS: 2.8 10*3/uL (ref 0.7–4.0)
Lymphocytes Relative: 27 %
MCH: 32.2 pg (ref 26.0–34.0)
MCHC: 34.3 g/dL (ref 30.0–36.0)
MCV: 93.8 fL (ref 78.0–100.0)
MONO ABS: 0.8 10*3/uL (ref 0.1–1.0)
MONOS PCT: 8 %
NEUTROS PCT: 64 %
Neutro Abs: 6.7 10*3/uL (ref 1.7–7.7)
Platelets: 123 10*3/uL — ABNORMAL LOW (ref 150–400)
RBC: 4.51 MIL/uL (ref 4.22–5.81)
RDW: 14.9 % (ref 11.5–15.5)
WBC: 10.4 10*3/uL (ref 4.0–10.5)

## 2017-05-10 LAB — COMPREHENSIVE METABOLIC PANEL
ALBUMIN: 3.8 g/dL (ref 3.5–5.0)
ALK PHOS: 116 U/L (ref 38–126)
ALT: 10 U/L — AB (ref 17–63)
AST: 19 U/L (ref 15–41)
Anion gap: 10 (ref 5–15)
BUN: 27 mg/dL — AB (ref 6–20)
CHLORIDE: 103 mmol/L (ref 101–111)
CO2: 26 mmol/L (ref 22–32)
CREATININE: 1.71 mg/dL — AB (ref 0.61–1.24)
Calcium: 8.8 mg/dL — ABNORMAL LOW (ref 8.9–10.3)
GFR calc Af Amer: 39 mL/min — ABNORMAL LOW (ref >60)
GFR calc non Af Amer: 34 mL/min — ABNORMAL LOW (ref >60)
GLUCOSE: 110 mg/dL — AB (ref 65–99)
Potassium: 3.9 mmol/L (ref 3.5–5.1)
SODIUM: 139 mmol/L (ref 135–145)
Total Bilirubin: 0.9 mg/dL (ref 0.3–1.2)
Total Protein: 7.9 g/dL (ref 6.5–8.1)

## 2017-05-10 LAB — URINALYSIS, ROUTINE W REFLEX MICROSCOPIC
Bilirubin Urine: NEGATIVE
GLUCOSE, UA: NEGATIVE mg/dL
Ketones, ur: NEGATIVE mg/dL
Leukocytes, UA: NEGATIVE
Nitrite: NEGATIVE
PH: 6 (ref 5.0–8.0)
Protein, ur: 100 mg/dL — AB
Specific Gravity, Urine: 1.013 (ref 1.005–1.030)
Squamous Epithelial / LPF: NONE SEEN

## 2017-05-10 LAB — I-STAT TROPONIN, ED
TROPONIN I, POC: 0.11 ng/mL — AB (ref 0.00–0.08)
Troponin i, poc: 0.11 ng/mL (ref 0.00–0.08)

## 2017-05-10 LAB — LIPASE, BLOOD: LIPASE: 19 U/L (ref 11–51)

## 2017-05-10 MED ORDER — HYDROCODONE-ACETAMINOPHEN 5-325 MG PO TABS
1.0000 | ORAL_TABLET | Freq: Once | ORAL | Status: AC
  Administered 2017-05-10: 1 via ORAL
  Filled 2017-05-10: qty 1

## 2017-05-10 MED ORDER — ONDANSETRON 4 MG PO TBDP: 4.0000 mg | ORAL_TABLET | Freq: Three times a day (TID) | ORAL | 1 refills | Status: DC | PRN

## 2017-05-10 MED ORDER — HYDROCODONE-ACETAMINOPHEN 5-325 MG PO TABS: 1.0000 | ORAL_TABLET | Freq: Four times a day (QID) | ORAL | 0 refills | Status: DC | PRN

## 2017-05-10 MED ORDER — ONDANSETRON 4 MG PO TBDP
4.0000 mg | ORAL_TABLET | Freq: Once | ORAL | Status: AC
  Administered 2017-05-10: 4 mg via ORAL
  Filled 2017-05-10: qty 1

## 2017-05-10 NOTE — ED Provider Notes (Signed)
[facility]-EMERGENCY DEPT Provider Note   CSN: [id number] Arrival date & time: 05/10/17  1502     History   Chief Complaint Chief Complaint  Patient presents with  . Back Pain    HPI [name] is a 83 y.o. male.  Patient with complaint of right-sided low back pain radiating into his right leg. Pain started just today. Patient denies any abdominal pain to me. Did talk about a little bit of left lateral chest discomfort. No fall or injury. States he has chronic swelling to his right leg. Patient known to have a history of abdominal aortic aneurysm. Patient also known to have a history of congestive heart failure does have a pacemaker no longer has defibrillator. And patient has a history of COPD.      Past Medical History:  Diagnosis Date  . AAA (abdominal aortic aneurysm) (HCC)    Has a known AAA of 3.2 x 3.4 cm by abdominal untrasound 03/27/12. Followup abdominal ultrasound on 03/29/13 showed a diameter of 3.6 cm, which is stable  . Abdominal bruit    Loud  . CAD (coronary artery disease)    a. s/p MI in 1988;  b. s/p CABG in 2000 (Dr. Barry Dienes) x 4, LIMA to LAD, SVG to diagonal, SVG to [facility], SVG to PDA;  c. 12/2013 NSTEMI/abnl MV;  d. 01/2014 Cath/PCI: native 3VD, VG->RCA 100, VG->OM aneurysmal 40ost/m, 41m(5.0x12 Veriflex BMS), VG->Diag aneurysmal, LIMA->LAD nl.  . Chronic systolic CHF (congestive heart failure) (HCC)    Hospitalization for CHF from 05/04/13-05/06/13. Treated medically. Had possible pneumonia with short course of antibiotics.  Marland Kitchen COPD (chronic obstructive pulmonary disease) (HCC)   . CVA (cerebral infarction) 07/2011   Remote left brain infarct with sensory deficits and had a left internal carotid stent placed by Dr. [name] at that time, no recurrent CVA or TIAs or CNS events.  . Headache   . Hyperlipidemia   . Hypertension   . Hypothyroid    On supplement "don't know if I take RX or not for my thyroid" (01/16/2014)  . Orthostatic hypotension   . PAD (peripheral artery  disease) (HCC)    Stable. Had a past RFPBG by Dr. [name] in 2009. this graft is occluded with reconstitution in the popliteal artery, which was patent. Right tibial was occluded and 2-vessel runoff on angiography done by [name] in 12/2010.  Marland Kitchen Peripheral vascular disease (HCC)   . Pneumonia   . Presence of permanent cardiac pacemaker   . Sick sinus syndrome (HCC)    PPM implanted for this and syncope   . Stroke (HCC)   . Syncope    tilt table test/8.10.01/orthostatic hypotension  . TIA (transient ischemic attack)     Patient Active Problem List   Diagnosis Date Noted  . BP (high blood pressure) 03/28/2016  . Encounter for follow-up examination after completed treatment for conditions other than malignant neoplasm 03/28/2016  . Thoracic aortic aneurysm without rupture (HCC) 03/15/2016  . Coronary artery disease due to lipid rich plaque   . Chest pain   . Abdominal aortic aneurysm (AAA) without rupture (HCC)   . Hypertensive emergency   . Acute on chronic systolic CHF (congestive heart failure) (HCC)   . Personal history of other medical treatment 02/11/2016  . Left sided abdominal pain 02/11/2016  . Nephropyelitis 02/11/2016  . Abdominal aortic aneurysm (AAA) (HCC) 02/09/2016  . Anxiety 02/09/2016  . Arterial vascular disease 02/09/2016  . Atrial fibrillation (HCC) 02/09/2016  . Benign fibroma of prostate 02/09/2016  .  Edema leg 02/09/2016  . Arteriosclerosis of autologous arterial coronary artery bypass graft 02/09/2016  . Chronic kidney disease 02/09/2016  . Congestive heart failure (HCC) 02/09/2016  . Diaphragmatic hernia 02/09/2016  . Hemorrhagic cerebrovascular accident (CVA) (HCC) 02/09/2016  . Peptic ulcer without hemorrhage or perforation 02/09/2016  . Arteriosclerosis of artery of extremity (HCC) 02/09/2016  . Peripheral vascular disease (HCC) 02/09/2016  . Abdominal pain 01/29/2016  . Nausea 01/29/2016  . UTI (urinary tract infection) 01/29/2016  . Right leg  pain 01/29/2016  . AP (abdominal pain)   . Hematuria 07/04/2015  . Faintness   . Post-operative complication 07/03/2015  . Postoperative anemia due to acute blood loss 07/03/2015  . Anemia due to blood loss   . Gross hematuria   . H/O endarterectomy   . BPH (benign prostatic hyperplasia) 06/30/2015  . PAD (peripheral artery disease) (HCC) 06/10/2015  . Elevated troponin 05/13/2015  . Nontraumatic cortical hemorrhage of cerebral hemisphere (HCC) 01/31/2015  . Essential hypertension 01/31/2015  . Orthostatic hypotension 01/31/2015  . HLD (hyperlipidemia) 01/31/2015  . Coronary artery disease involving native coronary artery of native heart with angina pectoris (HCC) 01/31/2015  . PVD (peripheral vascular disease) (HCC) 01/31/2015  . Cardiac pacemaker in situ 01/31/2015  . Artificial cardiac pacemaker 01/31/2015  . Right sided weakness   . Stroke (HCC)   . ICH (intracerebral hemorrhage) (HCC) 11/17/2014  . CAP (community acquired pneumonia) 06/09/2014  . Acute renal failure superimposed on stage 3 chronic kidney disease (HCC) 06/09/2014  . CHF (congestive heart failure) (HCC) 06/09/2014  . Acute-on-chronic renal failure (HCC) 06/09/2014  . Unstable angina (HCC) 01/16/2014  . COPD with acute exacerbation (HCC) 01/04/2014  . Influenza with respiratory manifestations 01/02/2014  . Acute on chronic kidney failure (HCC) 01/02/2014  . Leukocytosis, unspecified 01/02/2014  . Malnutrition of moderate degree (HCC) 01/01/2014  . Acute bronchitis 12/30/2013  . Chronic combined systolic and diastolic CHF (congestive heart failure) (HCC) 12/30/2013  . NSTEMI (non-ST elevated myocardial infarction) (HCC) 12/30/2013  . CAD (coronary artery disease) of artery bypass graft 12/13/2013  . Sick sinus syndrome [facility]) s/p [employer] PPM 12/13/2013  . Acute on chronic combined systolic and diastolic CHF (congestive heart failure) (HCC) 05/04/2013  . HTN (hypertension) 05/04/2013  . Hyperlipidemia  05/04/2013  . Tobacco abuse 05/04/2013  . History of CVA (cerebrovascular accident) 05/04/2013  . Thrombocytopenia (HCC) 05/04/2013  . Acute on chronic combined systolic and diastolic congestive heart failure (HCC) 05/04/2013  . Cerebrovascular accident, old 05/04/2013  . Colon polyp 04/19/2012  . Occlusion and stenosis of carotid artery without mention of cerebral infarction 02/07/2012  . Acquired involutional ptosis of eyelid 01/06/2012  . Entropion and trichiasis of eyelid 01/06/2012    Past Surgical History:  Procedure Laterality Date  . ABDOMINAL AORTAGRAM N/A 02/22/2012   Procedure: ABDOMINAL Ronny Flurry;  Surgeon: Nada Libman, MD;  Location: [facility] CATH LAB;  Service: Cardiovascular;  Laterality: N/A;  . abdominal aortagram    . CARDIAC CATHETERIZATION  2000  . CAROTID ANGIOGRAM Bilateral 02/22/2012   Procedure: CAROTID ANGIOGRAM;  Surgeon: Nada Libman, MD;  Location: [facility] CATH LAB;  Service: Cardiovascular;  Laterality: Bilateral;  . CAROTID STENT Left 06/23/11   By [name]  . CATARACT EXTRACTION W/ INTRAOCULAR LENS  IMPLANT, BILATERAL Bilateral   . COLONOSCOPY    . CORONARY ANGIOPLASTY WITH STENT PLACEMENT  2001-01/16/2014   "think today makes #4" (01/16/2014)  . CORONARY ARTERY BYPASS GRAFT  2000   1st in 2000 (Dr. Barry Dienes) x 4,  LIMA to LAD, SVG to diagonal, SVG to OM4, SVG to PDA. Re-cath in May 2012 with patent LIMA to LAD, patent SVG to diagonal, patent SVG to OM with left-to-right collaterals to an occluded right.  Marland Kitchen. ENDARTERECTOMY FEMORAL Right 06/25/2015   Procedure: ENDARTERECTOMY RIGHT FEMORAL ARTERY ;  Surgeon: Larina Earthlyodd F Early, MD;  [facility];  Service: Vascular;  Laterality: Right;  . FEMORAL BYPASS Right ?2001  . INGUINAL HERNIA REPAIR Right 1980's  . INSERT / REPLACE / REMOVE PACEMAKER     implanted 2003 by Dr [name] with gen change (MDT [id number]) 08/2010 by [facility]  . LEFT HEART CATHETERIZATION WITH CORONARY/GRAFT ANGIOGRAM N/A 01/16/2014   Procedure: LEFT HEART  CATHETERIZATION WITH Isabel CapriceORONARY/GRAFT ANGIOGRAM;  Surgeon: Kathleene Hazelhristopher D McAlhany, MD;  [name];  Service: Cardiovascular;  Laterality: N/A;  . PATCH ANGIOPLASTY Right 06/25/2015   Procedure: RIGHT COMMON FEMORAL ARTERY AND PROFUNDA PATCH ANGIOPLASTY USING HEMASHIELD PLATINUM FINESSE PATCH ;  Surgeon: Larina Earthlyodd F Early, MD;  [facility];  Service: Vascular;  Laterality: Right;  . PERIPHERAL VASCULAR CATHETERIZATION N/A 06/24/2015   Procedure: Abdominal Aortogram;  Surgeon: Nada LibmanVance W Brabham, MD;  Location: [facility] INVASIVE CV LAB;  Service: Cardiovascular;  Laterality: N/A;       Home Medications    Prior to Admission medications   Medication Sig Start Date End Date Taking? Authorizing Provider  acetaminophen (TYLENOL) 325 MG tablet Take 650 mg by mouth every 6 (six) hours as needed for mild pain. Reported on 11/25/2015   Yes [provider]  amLODipine (NORVASC) 10 MG tablet Take 10 mg by mouth daily.   Yes [provider]  aspirin 81 MG tablet Take 81 mg by mouth daily.   Yes [provider]  atorvastatin (LIPITOR) 40 MG tablet Take 1 tablet (40 mg total) by mouth daily at 6 PM. 05/14/15  Yes Robbie LisSimmons, Brittainy M, PA-C  isosorbide mononitrate (IMDUR) 60 MG 24 hr tablet Take 90 mg by mouth daily.   Yes [provider]  lisinopril (PRINIVIL,ZESTRIL) 5 MG tablet Take 5 mg by mouth daily.   Yes [provider]  mirtazapine (REMERON) 15 MG tablet Take 15 mg by mouth at bedtime.   Yes [provider]  nitroGLYCERIN (NITROSTAT) 0.4 MG SL tablet Place 1 tablet (0.4 mg total) under the tongue every 5 (five) minutes as needed. For chest pain 10/27/15  Yes Lars MassonNelson, Katarina H, MD  Tamsulosin HCl (FLOMAX) 0.4 MG CAPS Take 0.4 mg by mouth daily after supper.    Yes [provider]  acetaminophen-codeine (TYLENOL #3) 300-30 MG tablet Take 1-2 tablets by mouth every 6 (six) hours as needed for moderate pain. Patient not taking: Reported on  01/18/2017 09/30/16   Fayrene Helperran, Bowie, PA-C  hydrALAZINE (APRESOLINE) 25 MG tablet Take 1 tablet (25 mg total) by mouth 3 (three) times daily. Patient not taking: Reported on 05/10/2017 01/19/17 01/29/17  Antony MaduraHumes, Kelly, PA-C  HYDROcodone-acetaminophen (NORCO/VICODIN) 5-325 MG tablet Take 1-2 tablets by mouth every 6 (six) hours as needed for moderate pain. 05/10/17   Vanetta MuldersZackowski, Taos Tapp, MD  ondansetron (ZOFRAN ODT) 4 MG disintegrating tablet Take 1 tablet (4 mg total) by mouth every 8 (eight) hours as needed for nausea or vomiting. 05/10/17   Vanetta MuldersZackowski, Sammie Schermerhorn, MD  potassium chloride (K-DUR) 10 MEQ tablet Take 1 tablet (10 mEq total) by mouth daily. Patient not taking: Reported on 01/18/2017 02/26/16   Lars MassonNelson, Katarina H, MD    Family History Family History  Problem Relation Age of  Onset  . Cancer Brother 60       oral   . Prostate cancer Brother   . Hypertension Brother   . Hyperlipidemia Brother   . Lung disease Father        black lung disease    Social History Social History  Substance Use Topics  . Smoking status: Current Every Day Smoker    Packs/day: 0.50    Years: 68.00    Types: Cigarettes    Last attempt to quit: 05/02/2015  . Smokeless tobacco: Never Used  . Alcohol use No     Allergies   Tramadol; Pravachol; Pravastatin; Simvastatin; Other; Oxycodone; and Terazosin   Review of Systems Review of Systems  Constitutional: Negative for fever.  HENT: Negative for congestion.   Eyes: Negative for visual disturbance.  Cardiovascular: Positive for leg swelling. Negative for chest pain.  Gastrointestinal: Negative for abdominal pain, diarrhea, nausea and vomiting.  Genitourinary: Negative for dysuria.  Musculoskeletal: Positive for back pain. Negative for neck pain.  Skin: Negative for rash.  Neurological: Negative for weakness, numbness and headaches.  Hematological: Does not bruise/bleed easily.  Psychiatric/Behavioral: Negative for confusion.     Physical Exam Updated Vital  Signs BP (!) 176/79   Pulse 71   Temp 98.3 F (36.8 C)   Resp 20   Ht 1.753 m (5\' 9" )   Wt 67.6 kg (149 lb)   SpO2 94%   BMI 22.00 kg/m   Physical Exam  Constitutional: He is oriented to person, place, and time. He appears well-developed and well-nourished. No distress.  HENT:  Head: Normocephalic and atraumatic.  Mouth/Throat: Oropharynx is clear and moist.  Eyes: EOM are normal. Pupils are equal, round, and reactive to light.  Neck: Normal range of motion. Neck supple.  Cardiovascular: Normal rate, regular rhythm and normal heart sounds.   Pulmonary/Chest: Effort normal and breath sounds normal.  Abdominal: Soft. Bowel sounds are normal. There is no tenderness.  Musculoskeletal: Normal range of motion.  Swelling to the right leg compared to left. But according to family and patient that his baseline. Patient's dorsalis pedis pulses 1+ in both feet. No sensory deficit.  Neurological: He is alert and oriented to person, place, and time. No cranial nerve deficit or sensory deficit. He exhibits normal muscle tone. Coordination normal.  Skin: Skin is warm.  Nursing note and vitals reviewed.    ED Treatments / Results  Labs (all labs ordered are listed, but only abnormal results are displayed) Labs Reviewed  URINALYSIS, ROUTINE W REFLEX MICROSCOPIC - Abnormal; Notable for the following:       Result Value   Hgb urine dipstick LARGE (*)    Protein, ur 100 (*)    Bacteria, UA RARE (*)    All other components within normal limits  COMPREHENSIVE METABOLIC PANEL - Abnormal; Notable for the following:    Glucose, Bld 110 (*)    BUN 27 (*)    Creatinine, Ser 1.71 (*)    Calcium 8.8 (*)    ALT 10 (*)    GFR calc non Af Amer 34 (*)    GFR calc Af Amer 39 (*)    All other components within normal limits  CBC WITH DIFFERENTIAL/PLATELET - Abnormal; Notable for the following:    Platelets 123 (*)    All other components within normal limits  I-STAT TROPOININ, ED - Abnormal; Notable  for the following:    Troponin i, poc 0.11 (*)    All other components within normal  limits  I-STAT TROPOININ, ED - Abnormal; Notable for the following:    Troponin i, poc 0.11 (*)    All other components within normal limits  URINE CULTURE  LIPASE, BLOOD    EKG  EKG Interpretation  Date/Time:  Tuesday May 10 2017 17:43:48 EDT Ventricular Rate:  73 PR Interval:    QRS Duration: 114 QT Interval:  412 QTC Calculation: 442 R Axis:   -38 Text Interpretation:  Atrial-paced complexes Paired ventricular premature complexes Borderline prolonged PR interval Borderline IVCD with LAD Repol abnrm suggests ischemia, lateral leads Confirmed by Vanetta Mulders [phone] on 05/10/2017 7:24:08 PM       Radiology Dg Chest 2 View  Result Date: 05/10/2017 CLINICAL DATA:  Chest pain EXAM: CHEST  2 VIEW COMPARISON:  01/19/2017, 09/30/2016 FINDINGS: Post sternotomy changes. Left-sided pacing device as before. No acute consolidation or pleural effusion. Stable borderline cardiomegaly. Aneurysmal dilatation of the aorta with grossly stable mediastinal contour. No pneumothorax. IMPRESSION: 1. Cardiomegaly without infiltrate or edema 2. Grossly stable aneurysmal dilatation of the aorta. Electronically Signed   By: Jasmine Pang M.D.   On: 05/10/2017 18:07   Ct Lumbar Spine Wo Contrast  Result Date: 05/10/2017 CLINICAL DATA:  Low back pain radiating to both lower extremities. EXAM: CT LUMBAR SPINE WITHOUT CONTRAST TECHNIQUE: Multidetector CT imaging of the lumbar spine was performed without intravenous contrast administration. Multiplanar CT image reconstructions were also generated. COMPARISON:  CT abdomen pelvis 01/28/2016 FINDINGS: Segmentation: Normal Alignment: Normal Vertebrae: No acute compression fracture. Paraspinal and other soft tissues: There is an infrarenal abdominal aortic aneurysm that measures 4.4 cm. There is a left common iliac artery aneurysm measuring 4.0 cm. The sizes are unchanged. Extensive  aortic and iliac artery atherosclerotic calcification. Disc levels: T12-L1: Normal disc space and facets. No spinal canal or neuroforaminal stenosis. L1-L2: Normal disc space and facets. No spinal canal or neuroforaminal stenosis. L2-L3: Small disc bulge with mild left neural foraminal stenosis. L3-L4: Moderate bilateral facet hypertrophy. Small disc bulge. No spinal canal stenosis. Mild-to-moderate bilateral foraminal narrowing. L4-L5: Severe bilateral facet hypertrophy with partially calcified ligamentum flavum. Small disc bulge. Mild spinal canal stenosis. Severe right and mild left neural foraminal stenosis. L5-S1: Severe facet hypertrophy. Right pars interarticularis defect. No spinal canal stenosis. Mild right foraminal stenosis. Visualized sacrum: Normal. IMPRESSION: 1. No acute abnormality of the lumbar spine. 2. Multilevel neural foraminal narrowing, predominantly caused by facet arthrosis, worst at right L4-5. 3. Multilevel severe facet arthrosis, which may be a source of localized back pain. 4. Unchanged size of infrarenal abdominal aortic and left common iliac artery aneurysms. Recommend followup by ultrasound in 1 year. This recommendation follows ACR consensus guidelines: White Paper of the ACR Incidental Findings Committee II on Vascular Findings. J Am Coll Radiol 2013; 10:789-794. Electronically Signed   By: Deatra Robinson M.D.   On: 05/10/2017 18:24    Procedures Procedures (including critical care time)  Medications Ordered in ED Medications  HYDROcodone-acetaminophen (NORCO/VICODIN) 5-325 MG per tablet 1 tablet (1 tablet Oral Given 05/10/17 2120)  ondansetron (ZOFRAN-ODT) disintegrating tablet 4 mg (4 mg Oral Given 05/10/17 2120)     Initial Impression / Assessment and Plan / ED Course  I have reviewed the triage vital signs and the nursing notes.  Pertinent labs & imaging results that were available during my care of the patient were reviewed by me and considered in my medical decision  making (see chart for details).     Patient presenting with right low back pain no  history of any fall. Pain radiates into his right leg. Has no numbness or weakness to his toes. Symptoms consistent probably with low back pain and sciatica. CT lumbar area without evidence of any bony abnormalities. Patient's aneurysms are stable. Patient has good pulses to both feet about 1+. So there is is no vascular insufficiency. Patient treated here with Zofran and hydrocodone which he tolerated fine. Patient we discharged home with same medications and follow-up with his doctor.  Patient has chronically elevated troponins. They were done 2 here and they remained stable. No concerns for an acute cardiac event.  Final Clinical Impressions(s) / ED Diagnoses   Final diagnoses:  Acute right-sided low back pain with right-sided sciatica    New Prescriptions New Prescriptions   HYDROCODONE-ACETAMINOPHEN (NORCO/VICODIN) 5-325 MG TABLET    Take 1-2 tablets by mouth every 6 (six) hours as needed for moderate pain.   ONDANSETRON (ZOFRAN ODT) 4 MG DISINTEGRATING TABLET    Take 1 tablet (4 mg total) by mouth every 8 (eight) hours as needed for nausea or vomiting.     Vanetta Mulders, MD 05/10/17 2203

## 2017-05-10 NOTE — ED Triage Notes (Signed)
Patient complaining of abdominal and back pain. Lower back pain radiating to right leg. Patient abdominal pain is left lower quadrant pain. Patient denies any injury or falls.

## 2017-05-10 NOTE — ED Triage Notes (Signed)
Per pt, states right back pain radiating down right leg-states he woke with this pain-states he just now started complaining of upper left back to abdomin pain while being transported

## 2017-05-10 NOTE — Discharge Instructions (Signed)
Return for any new or worse symptoms. The pain medication as needed. Take the Zofran to help with any nausea from the pain medication. Return for any new or worse symptoms. Would recommend taking just 1 tablet of the pain medicine since sure of older age. Make an appointment to follow-up with your doctor.

## 2017-05-10 NOTE — ED Notes (Signed)
Bed: [facility] Expected date:  Expected time:  Means of arrival:  Comments: 

## 2017-05-12 LAB — URINE CULTURE: Culture: <10000 — AB

## 2017-05-31 ENCOUNTER — Encounter (HOSPITAL_COMMUNITY): Payer: Self-pay | Admitting: Emergency Medicine

## 2017-05-31 ENCOUNTER — Emergency Department (HOSPITAL_COMMUNITY): Payer: Medicare Other

## 2017-05-31 ENCOUNTER — Observation Stay (HOSPITAL_COMMUNITY): Payer: Medicare Other

## 2017-05-31 ENCOUNTER — Observation Stay (HOSPITAL_COMMUNITY)
Admission: EM | Admit: 2017-05-31 | Discharge: 2017-06-02 | Disposition: A | Discharge status: Home / Self Care | Payer: Medicare Other | Attending: Internal Medicine | Admitting: Internal Medicine

## 2017-05-31 DIAGNOSIS — I951 Orthostatic hypotension: Secondary | ICD-10-CM | POA: Diagnosis present

## 2017-05-31 DIAGNOSIS — I10 Essential (primary) hypertension: Secondary | ICD-10-CM | POA: Diagnosis not present

## 2017-05-31 DIAGNOSIS — Z79899 Other long term (current) drug therapy: Secondary | ICD-10-CM | POA: Diagnosis not present

## 2017-05-31 DIAGNOSIS — I739 Peripheral vascular disease, unspecified: Secondary | ICD-10-CM | POA: Diagnosis present

## 2017-05-31 DIAGNOSIS — E039 Hypothyroidism, unspecified: Secondary | ICD-10-CM | POA: Diagnosis not present

## 2017-05-31 DIAGNOSIS — R7989 Other specified abnormal findings of blood chemistry: Secondary | ICD-10-CM

## 2017-05-31 DIAGNOSIS — I480 Paroxysmal atrial fibrillation: Secondary | ICD-10-CM | POA: Diagnosis not present

## 2017-05-31 DIAGNOSIS — I509 Heart failure, unspecified: Secondary | ICD-10-CM

## 2017-05-31 DIAGNOSIS — R748 Abnormal levels of other serum enzymes: Secondary | ICD-10-CM

## 2017-05-31 DIAGNOSIS — J209 Acute bronchitis, unspecified: Secondary | ICD-10-CM | POA: Diagnosis present

## 2017-05-31 DIAGNOSIS — E440 Moderate protein-calorie malnutrition: Secondary | ICD-10-CM | POA: Diagnosis present

## 2017-05-31 DIAGNOSIS — N183 Chronic kidney disease, stage 3 (moderate): Secondary | ICD-10-CM | POA: Diagnosis not present

## 2017-05-31 DIAGNOSIS — I25110 Atherosclerotic heart disease of native coronary artery with unstable angina pectoris: Secondary | ICD-10-CM | POA: Diagnosis not present

## 2017-05-31 DIAGNOSIS — Z7982 Long term (current) use of aspirin: Secondary | ICD-10-CM | POA: Diagnosis not present

## 2017-05-31 DIAGNOSIS — J441 Chronic obstructive pulmonary disease with (acute) exacerbation: Secondary | ICD-10-CM | POA: Diagnosis present

## 2017-05-31 DIAGNOSIS — I5042 Chronic combined systolic (congestive) and diastolic (congestive) heart failure: Secondary | ICD-10-CM | POA: Diagnosis present

## 2017-05-31 DIAGNOSIS — N400 Enlarged prostate without lower urinary tract symptoms: Secondary | ICD-10-CM | POA: Diagnosis present

## 2017-05-31 DIAGNOSIS — I2510 Atherosclerotic heart disease of native coronary artery without angina pectoris: Secondary | ICD-10-CM | POA: Diagnosis not present

## 2017-05-31 DIAGNOSIS — I5022 Chronic systolic (congestive) heart failure: Secondary | ICD-10-CM

## 2017-05-31 DIAGNOSIS — I252 Old myocardial infarction: Secondary | ICD-10-CM | POA: Diagnosis not present

## 2017-05-31 DIAGNOSIS — R778 Other specified abnormalities of plasma proteins: Secondary | ICD-10-CM | POA: Diagnosis present

## 2017-05-31 DIAGNOSIS — D696 Thrombocytopenia, unspecified: Secondary | ICD-10-CM | POA: Diagnosis present

## 2017-05-31 DIAGNOSIS — R110 Nausea: Secondary | ICD-10-CM | POA: Diagnosis present

## 2017-05-31 DIAGNOSIS — Z87442 Personal history of urinary calculi: Secondary | ICD-10-CM

## 2017-05-31 DIAGNOSIS — I495 Sick sinus syndrome: Secondary | ICD-10-CM | POA: Diagnosis present

## 2017-05-31 DIAGNOSIS — J449 Chronic obstructive pulmonary disease, unspecified: Secondary | ICD-10-CM | POA: Diagnosis not present

## 2017-05-31 DIAGNOSIS — R42 Dizziness and giddiness: Secondary | ICD-10-CM | POA: Diagnosis present

## 2017-05-31 DIAGNOSIS — I4891 Unspecified atrial fibrillation: Secondary | ICD-10-CM | POA: Diagnosis present

## 2017-05-31 DIAGNOSIS — R55 Syncope and collapse: Secondary | ICD-10-CM | POA: Diagnosis present

## 2017-05-31 DIAGNOSIS — I25810 Atherosclerosis of coronary artery bypass graft(s) without angina pectoris: Secondary | ICD-10-CM | POA: Diagnosis present

## 2017-05-31 DIAGNOSIS — F419 Anxiety disorder, unspecified: Secondary | ICD-10-CM | POA: Diagnosis present

## 2017-05-31 DIAGNOSIS — Z8673 Personal history of transient ischemic attack (TIA), and cerebral infarction without residual deficits: Secondary | ICD-10-CM

## 2017-05-31 DIAGNOSIS — Z950 Presence of cardiac pacemaker: Secondary | ICD-10-CM | POA: Diagnosis present

## 2017-05-31 DIAGNOSIS — I130 Hypertensive heart and chronic kidney disease with heart failure and stage 1 through stage 4 chronic kidney disease, or unspecified chronic kidney disease: Secondary | ICD-10-CM | POA: Diagnosis not present

## 2017-05-31 DIAGNOSIS — I5043 Acute on chronic combined systolic (congestive) and diastolic (congestive) heart failure: Secondary | ICD-10-CM | POA: Diagnosis not present

## 2017-05-31 DIAGNOSIS — I714 Abdominal aortic aneurysm, without rupture, unspecified: Secondary | ICD-10-CM | POA: Diagnosis present

## 2017-05-31 DIAGNOSIS — F17210 Nicotine dependence, cigarettes, uncomplicated: Secondary | ICD-10-CM | POA: Diagnosis not present

## 2017-05-31 DIAGNOSIS — E785 Hyperlipidemia, unspecified: Secondary | ICD-10-CM | POA: Diagnosis present

## 2017-05-31 DIAGNOSIS — I214 Non-ST elevation (NSTEMI) myocardial infarction: Secondary | ICD-10-CM | POA: Diagnosis present

## 2017-05-31 DIAGNOSIS — R079 Chest pain, unspecified: Secondary | ICD-10-CM | POA: Diagnosis not present

## 2017-05-31 DIAGNOSIS — I2583 Coronary atherosclerosis due to lipid rich plaque: Secondary | ICD-10-CM

## 2017-05-31 DIAGNOSIS — Z9889 Other specified postprocedural states: Secondary | ICD-10-CM

## 2017-05-31 LAB — COMPREHENSIVE METABOLIC PANEL
ALT: 9 U/L — AB (ref 17–63)
AST: 22 U/L (ref 15–41)
Albumin: 3.4 g/dL — ABNORMAL LOW (ref 3.5–5.0)
Alkaline Phosphatase: 106 U/L (ref 38–126)
Anion gap: 10 (ref 5–15)
BUN: 20 mg/dL (ref 6–20)
CHLORIDE: 105 mmol/L (ref 101–111)
CO2: 24 mmol/L (ref 22–32)
CREATININE: 1.65 mg/dL — AB (ref 0.61–1.24)
Calcium: 8.8 mg/dL — ABNORMAL LOW (ref 8.9–10.3)
GFR calc non Af Amer: 35 mL/min — ABNORMAL LOW (ref >60)
GFR, EST AFRICAN AMERICAN: 41 mL/min — AB (ref >60)
Glucose, Bld: 95 mg/dL (ref 65–99)
POTASSIUM: 3.6 mmol/L (ref 3.5–5.1)
SODIUM: 139 mmol/L (ref 135–145)
Total Bilirubin: 0.8 mg/dL (ref 0.3–1.2)
Total Protein: 6.8 g/dL (ref 6.5–8.1)

## 2017-05-31 LAB — CBC
HCT: 44.7 % (ref 39.0–52.0)
Hemoglobin: 15.1 g/dL (ref 13.0–17.0)
MCH: 31.9 pg (ref 26.0–34.0)
MCHC: 33.8 g/dL (ref 30.0–36.0)
MCV: 94.3 fL (ref 78.0–100.0)
PLATELETS: 124 10*3/uL — AB (ref 150–400)
RBC: 4.74 MIL/uL (ref 4.22–5.81)
RDW: 14.6 % (ref 11.5–15.5)
WBC: 9.7 10*3/uL (ref 4.0–10.5)

## 2017-05-31 LAB — URINALYSIS, ROUTINE W REFLEX MICROSCOPIC
Bacteria, UA: NONE SEEN
Bilirubin Urine: NEGATIVE
Glucose, UA: NEGATIVE mg/dL
Ketones, ur: NEGATIVE mg/dL
Leukocytes, UA: NEGATIVE
Nitrite: NEGATIVE
PROTEIN: 30 mg/dL — AB
SQUAMOUS EPITHELIAL / LPF: NONE SEEN
Specific Gravity, Urine: 1.006 (ref 1.005–1.030)
pH: 8 (ref 5.0–8.0)

## 2017-05-31 LAB — APTT: APTT: 36 s (ref 24–36)

## 2017-05-31 LAB — DIFFERENTIAL
BASOS ABS: 0 10*3/uL (ref 0.0–0.1)
Basophils Relative: 0 %
EOS ABS: 0.2 10*3/uL (ref 0.0–0.7)
Eosinophils Relative: 2 %
Lymphocytes Relative: 38 %
Lymphs Abs: 3.7 10*3/uL (ref 0.7–4.0)
MONO ABS: 0.5 10*3/uL (ref 0.1–1.0)
Monocytes Relative: 5 %
NEUTROS ABS: 5.4 10*3/uL (ref 1.7–7.7)
Neutrophils Relative %: 55 %

## 2017-05-31 LAB — I-STAT CHEM 8, ED
BUN: 23 mg/dL — ABNORMAL HIGH (ref 6–20)
CHLORIDE: 103 mmol/L (ref 101–111)
Calcium, Ion: 1.08 mmol/L — ABNORMAL LOW (ref 1.15–1.40)
Creatinine, Ser: 1.5 mg/dL — ABNORMAL HIGH (ref 0.61–1.24)
Glucose, Bld: 92 mg/dL (ref 65–99)
HEMATOCRIT: 48 % (ref 39.0–52.0)
Hemoglobin: 16.3 g/dL (ref 13.0–17.0)
POTASSIUM: 3.6 mmol/L (ref 3.5–5.1)
SODIUM: 141 mmol/L (ref 135–145)
TCO2: 25 mmol/L (ref 0–100)

## 2017-05-31 LAB — RAPID URINE DRUG SCREEN, HOSP PERFORMED
AMPHETAMINES: NOT DETECTED
Barbiturates: NOT DETECTED
Benzodiazepines: NOT DETECTED
Cocaine: NOT DETECTED
Opiates: NOT DETECTED
TETRAHYDROCANNABINOL: NOT DETECTED

## 2017-05-31 LAB — LIPID PANEL
CHOL/HDL RATIO: 3.2 ratio
CHOLESTEROL: 155 mg/dL (ref 0–200)
HDL: 48 mg/dL (ref >40)
LDL Cholesterol: 91 mg/dL (ref 0–99)
Triglycerides: 82 mg/dL (ref <150)
VLDL: 16 mg/dL (ref 0–40)

## 2017-05-31 LAB — TROPONIN I
TROPONIN I: 0.21 ng/mL — AB (ref <0.03)
Troponin I: 0.21 ng/mL (ref <0.03)
Troponin I: 0.21 ng/mL (ref <0.03)

## 2017-05-31 LAB — I-STAT TROPONIN, ED: Troponin i, poc: 0.17 ng/mL (ref 0.00–0.08)

## 2017-05-31 LAB — PROTIME-INR
INR: 1.14
PROTHROMBIN TIME: 14.7 s (ref 11.4–15.2)

## 2017-05-31 LAB — ETHANOL

## 2017-05-31 MED ORDER — ISOSORBIDE MONONITRATE ER 30 MG PO TB24
90.0000 mg | ORAL_TABLET | Freq: Every day | ORAL | Status: DC
  Administered 2017-06-01: 90 mg via ORAL
  Filled 2017-05-31 (×2): qty 3

## 2017-05-31 MED ORDER — ATORVASTATIN CALCIUM 40 MG PO TABS
40.0000 mg | ORAL_TABLET | Freq: Every day | ORAL | Status: DC
  Administered 2017-06-01: 40 mg via ORAL
  Filled 2017-05-31: qty 1

## 2017-05-31 MED ORDER — HYDROCODONE-ACETAMINOPHEN 5-325 MG PO TABS
1.0000 | ORAL_TABLET | Freq: Four times a day (QID) | ORAL | Status: DC | PRN
  Administered 2017-05-31: 2 via ORAL
  Filled 2017-05-31: qty 2

## 2017-05-31 MED ORDER — AMLODIPINE BESYLATE 5 MG PO TABS
10.0000 mg | ORAL_TABLET | Freq: Every day | ORAL | Status: DC
  Administered 2017-06-01: 10 mg via ORAL
  Filled 2017-05-31: qty 4
  Filled 2017-05-31: qty 2

## 2017-05-31 MED ORDER — ENOXAPARIN SODIUM 40 MG/0.4ML ~~LOC~~ SOLN: 40.0000 mg | SUBCUTANEOUS | Status: DC

## 2017-05-31 MED ORDER — ACETAMINOPHEN 325 MG PO TABS: 650.0000 mg | ORAL_TABLET | ORAL | Status: DC | PRN

## 2017-05-31 MED ORDER — ONDANSETRON HCL 4 MG/2ML IJ SOLN
4.0000 mg | Freq: Once | INTRAMUSCULAR | Status: AC
  Administered 2017-05-31: 4 mg via INTRAVENOUS
  Filled 2017-05-31: qty 2

## 2017-05-31 MED ORDER — SODIUM CHLORIDE 0.9 % IV BOLUS (SEPSIS)
1000.0000 mL | Freq: Once | INTRAVENOUS | Status: AC
  Administered 2017-05-31: 1000 mL via INTRAVENOUS

## 2017-05-31 MED ORDER — SODIUM CHLORIDE 0.9 % IV SOLN
INTRAVENOUS | Status: DC
  Administered 2017-05-31 – 2017-06-02 (×4): via INTRAVENOUS

## 2017-05-31 MED ORDER — DEXAMETHASONE 4 MG PO TABS
10.0000 mg | ORAL_TABLET | Freq: Once | ORAL | Status: AC
  Administered 2017-05-31: 10 mg via ORAL

## 2017-05-31 MED ORDER — LISINOPRIL 10 MG PO TABS
5.0000 mg | ORAL_TABLET | Freq: Every day | ORAL | Status: DC
  Administered 2017-06-01: 5 mg via ORAL
  Filled 2017-05-31: qty 1

## 2017-05-31 MED ORDER — HEPARIN SODIUM (PORCINE) 5000 UNIT/ML IJ SOLN: 5000.0000 [IU] | Freq: Three times a day (TID) | INTRAMUSCULAR | Status: DC

## 2017-05-31 MED ORDER — ACETAMINOPHEN 325 MG PO TABS: 650.0000 mg | ORAL_TABLET | Freq: Four times a day (QID) | ORAL | Status: DC | PRN

## 2017-05-31 MED ORDER — GI COCKTAIL ~~LOC~~: 30.0000 mL | Freq: Three times a day (TID) | ORAL | Status: DC | PRN

## 2017-05-31 MED ORDER — MIRTAZAPINE 15 MG PO TABS
15.0000 mg | ORAL_TABLET | Freq: Every day | ORAL | Status: DC
  Administered 2017-05-31 – 2017-06-01 (×2): 15 mg via ORAL
  Filled 2017-05-31 (×2): qty 1

## 2017-05-31 MED ORDER — TAMSULOSIN HCL 0.4 MG PO CAPS
0.4000 mg | ORAL_CAPSULE | Freq: Every day | ORAL | Status: DC
  Administered 2017-05-31 – 2017-06-01 (×2): 0.4 mg via ORAL
  Filled 2017-05-31 (×2): qty 1

## 2017-05-31 MED ORDER — MECLIZINE HCL 25 MG PO TABS: 12.5000 mg | ORAL_TABLET | Freq: Three times a day (TID) | ORAL | Status: DC | PRN

## 2017-05-31 MED ORDER — METOPROLOL SUCCINATE ER 50 MG PO TB24
50.0000 mg | ORAL_TABLET | Freq: Every day | ORAL | Status: DC
  Administered 2017-05-31 – 2017-06-02 (×3): 50 mg via ORAL
  Filled 2017-05-31 (×3): qty 1

## 2017-05-31 MED ORDER — ONDANSETRON HCL 4 MG/2ML IJ SOLN: 4.0000 mg | Freq: Four times a day (QID) | INTRAMUSCULAR | Status: DC | PRN

## 2017-05-31 NOTE — ED Provider Notes (Signed)
[facility]-EMERGENCY DEPT Provider Note   CSN: [id number] Arrival date & time: 05/31/17  1235     History   Chief Complaint Chief Complaint  Patient presents with  . Dizziness    HPI [name] is a 83 y.o. male hx of AAA, CAD with CABG, COPD, HTN, Peripheral vascular disease, previous stroke here presenting with dizziness, lightheadedness. Patient was seen in the ED about 3 weeks ago for hip pain and x-rays were unremarkable. Patient is from home and for the last week, family noticed that he was very dizzy and has trouble walking. He also has been complaining of left-sided chest pain and shoulder pain as well. Patient has a pacemaker and denies palpitations or shortness of breath. Not on blood thinners currently.   The history is provided by the patient.    Past Medical History:  Diagnosis Date  . AAA (abdominal aortic aneurysm) (HCC)    Has a known AAA of 3.2 x 3.4 cm by abdominal untrasound 03/27/12. Followup abdominal ultrasound on 03/29/13 showed a diameter of 3.6 cm, which is stable  . Abdominal bruit    Loud  . CAD (coronary artery disease)    a. s/p MI in 1988;  b. s/p CABG in 2000 (Dr. Barry Dienes) x 4, LIMA to LAD, SVG to diagonal, SVG to [facility], SVG to PDA;  c. 12/2013 NSTEMI/abnl MV;  d. 01/2014 Cath/PCI: native 3VD, VG->RCA 100, VG->OM aneurysmal 40ost/m, 84m(5.0x12 Veriflex BMS), VG->Diag aneurysmal, LIMA->LAD nl.  . Chronic systolic CHF (congestive heart failure) (HCC)    Hospitalization for CHF from 05/04/13-05/06/13. Treated medically. Had possible pneumonia with short course of antibiotics.  Marland Kitchen COPD (chronic obstructive pulmonary disease) (HCC)   . CVA (cerebral infarction) 07/2011   Remote left brain infarct with sensory deficits and had a left internal carotid stent placed by Dr. [name] at that time, no recurrent CVA or TIAs or CNS events.  . Headache   . Hyperlipidemia   . Hypertension   . Hypothyroid    On supplement "don't know if I take RX or not for my thyroid" (01/16/2014)   . Orthostatic hypotension   . PAD (peripheral artery disease) (HCC)    Stable. Had a past RFPBG by Dr. [name] in 2009. this graft is occluded with reconstitution in the popliteal artery, which was patent. Right tibial was occluded and 2-vessel runoff on angiography done by [name] in 12/2010.  Marland Kitchen Peripheral vascular disease (HCC)   . Pneumonia   . Presence of permanent cardiac pacemaker   . Sick sinus syndrome (HCC)    PPM implanted for this and syncope   . Stroke (HCC)   . Syncope    tilt table test/8.10.01/orthostatic hypotension  . TIA (transient ischemic attack)     Patient Active Problem List   Diagnosis Date Noted  . BP (high blood pressure) 03/28/2016  . Encounter for follow-up examination after completed treatment for conditions other than malignant neoplasm 03/28/2016  . Thoracic aortic aneurysm without rupture (HCC) 03/15/2016  . Coronary artery disease due to lipid rich plaque   . Chest pain   . Abdominal aortic aneurysm (AAA) without rupture (HCC)   . Hypertensive emergency   . Acute on chronic systolic CHF (congestive heart failure) (HCC)   . Personal history of other medical treatment 02/11/2016  . Left sided abdominal pain 02/11/2016  . Nephropyelitis 02/11/2016  . Abdominal aortic aneurysm (AAA) (HCC) 02/09/2016  . Anxiety 02/09/2016  . Arterial vascular disease 02/09/2016  . Atrial fibrillation (HCC) 02/09/2016  .  Benign fibroma of prostate 02/09/2016  . Edema leg 02/09/2016  . Arteriosclerosis of autologous arterial coronary artery bypass graft 02/09/2016  . Chronic kidney disease 02/09/2016  . Congestive heart failure (HCC) 02/09/2016  . Diaphragmatic hernia 02/09/2016  . Hemorrhagic cerebrovascular accident (CVA) (HCC) 02/09/2016  . Peptic ulcer without hemorrhage or perforation 02/09/2016  . Arteriosclerosis of artery of extremity (HCC) 02/09/2016  . Peripheral vascular disease (HCC) 02/09/2016  . Abdominal pain 01/29/2016  . Nausea 01/29/2016  .  UTI (urinary tract infection) 01/29/2016  . Right leg pain 01/29/2016  . AP (abdominal pain)   . Hematuria 07/04/2015  . Faintness   . Post-operative complication 07/03/2015  . Postoperative anemia due to acute blood loss 07/03/2015  . Anemia due to blood loss   . Gross hematuria   . H/O endarterectomy   . BPH (benign prostatic hyperplasia) 06/30/2015  . PAD (peripheral artery disease) (HCC) 06/10/2015  . Elevated troponin 05/13/2015  . Nontraumatic cortical hemorrhage of cerebral hemisphere (HCC) 01/31/2015  . Essential hypertension 01/31/2015  . Orthostatic hypotension 01/31/2015  . HLD (hyperlipidemia) 01/31/2015  . Coronary artery disease involving native coronary artery of native heart with angina pectoris (HCC) 01/31/2015  . PVD (peripheral vascular disease) (HCC) 01/31/2015  . Cardiac pacemaker in situ 01/31/2015  . Artificial cardiac pacemaker 01/31/2015  . Right sided weakness   . Stroke (HCC)   . ICH (intracerebral hemorrhage) (HCC) 11/17/2014  . CAP (community acquired pneumonia) 06/09/2014  . Acute renal failure superimposed on stage 3 chronic kidney disease (HCC) 06/09/2014  . CHF (congestive heart failure) (HCC) 06/09/2014  . Acute-on-chronic renal failure (HCC) 06/09/2014  . Unstable angina (HCC) 01/16/2014  . COPD with acute exacerbation (HCC) 01/04/2014  . Influenza with respiratory manifestations 01/02/2014  . Acute on chronic kidney failure (HCC) 01/02/2014  . Leukocytosis, unspecified 01/02/2014  . Malnutrition of moderate degree (HCC) 01/01/2014  . Acute bronchitis 12/30/2013  . Chronic combined systolic and diastolic CHF (congestive heart failure) (HCC) 12/30/2013  . NSTEMI (non-ST elevated myocardial infarction) (HCC) 12/30/2013  . CAD (coronary artery disease) of artery bypass graft 12/13/2013  . Sick sinus syndrome [facility]) s/p [employer] PPM 12/13/2013  . Acute on chronic combined systolic and diastolic CHF (congestive heart failure) (HCC) 05/04/2013  .  HTN (hypertension) 05/04/2013  . Hyperlipidemia 05/04/2013  . Tobacco abuse 05/04/2013  . History of CVA (cerebrovascular accident) 05/04/2013  . Thrombocytopenia (HCC) 05/04/2013  . Acute on chronic combined systolic and diastolic congestive heart failure (HCC) 05/04/2013  . Cerebrovascular accident, old 05/04/2013  . Colon polyp 04/19/2012  . Occlusion and stenosis of carotid artery without mention of cerebral infarction 02/07/2012  . Acquired involutional ptosis of eyelid 01/06/2012  . Entropion and trichiasis of eyelid 01/06/2012    Past Surgical History:  Procedure Laterality Date  . ABDOMINAL AORTAGRAM N/A 02/22/2012   Procedure: ABDOMINAL Ronny Flurry;  Surgeon: Nada Libman, MD;  Location: [facility] CATH LAB;  Service: Cardiovascular;  Laterality: N/A;  . abdominal aortagram    . CARDIAC CATHETERIZATION  2000  . CAROTID ANGIOGRAM Bilateral 02/22/2012   Procedure: CAROTID ANGIOGRAM;  Surgeon: Nada Libman, MD;  Location: [facility] CATH LAB;  Service: Cardiovascular;  Laterality: Bilateral;  . CAROTID STENT Left 06/23/11   By [name]  . CATARACT EXTRACTION W/ INTRAOCULAR LENS  IMPLANT, BILATERAL Bilateral   . COLONOSCOPY    . CORONARY ANGIOPLASTY WITH STENT PLACEMENT  2001-01/16/2014   "think today makes #4" (01/16/2014)  . CORONARY ARTERY BYPASS GRAFT  2000  1st in 2000 (Dr. Barry Dienes) x 4, LIMA to LAD, SVG to diagonal, SVG to OM4, SVG to PDA. Re-cath in May 2012 with patent LIMA to LAD, patent SVG to diagonal, patent SVG to OM with left-to-right collaterals to an occluded right.  Marland Kitchen ENDARTERECTOMY FEMORAL Right 06/25/2015   Procedure: ENDARTERECTOMY RIGHT FEMORAL ARTERY ;  Surgeon: Larina Earthly, MD;  Location: [facility] OR;  Service: Vascular;  Laterality: Right;  . FEMORAL BYPASS Right ?2001  . INGUINAL HERNIA REPAIR Right 1980's  . INSERT / REPLACE / REMOVE PACEMAKER     implanted 2003 by Dr Jenne Campus with gen change (MDT [id number]) 08/2010 by [facility]  . LEFT HEART CATHETERIZATION WITH CORONARY/GRAFT  ANGIOGRAM N/A 01/16/2014   Procedure: LEFT HEART CATHETERIZATION WITH Isabel Caprice;  Surgeon: Kathleene Hazel, MD;  Location: [facility] CATH LAB;  Service: Cardiovascular;  Laterality: N/A;  . PATCH ANGIOPLASTY Right 06/25/2015   Procedure: RIGHT COMMON FEMORAL ARTERY AND PROFUNDA PATCH ANGIOPLASTY USING HEMASHIELD PLATINUM FINESSE PATCH ;  Surgeon: Larina Earthly, MD;  Location: [facility] OR;  Service: Vascular;  Laterality: Right;  . PERIPHERAL VASCULAR CATHETERIZATION N/A 06/24/2015   Procedure: Abdominal Aortogram;  Surgeon: Nada Libman, MD;  Location: [facility] INVASIVE CV LAB;  Service: Cardiovascular;  Laterality: N/A;       Home Medications    Prior to Admission medications   Medication Sig Start Date End Date Taking? Authorizing Provider  acetaminophen (TYLENOL) 325 MG tablet Take 650 mg by mouth every 6 (six) hours as needed for mild pain. Reported on 11/25/2015    [provider]  acetaminophen-codeine (TYLENOL #3) 300-30 MG tablet Take 1-2 tablets by mouth every 6 (six) hours as needed for moderate pain. Patient not taking: Reported on 01/18/2017 09/30/16   Fayrene Helper, PA-C  amLODipine (NORVASC) 10 MG tablet Take 10 mg by mouth daily.    [provider]  aspirin 81 MG tablet Take 81 mg by mouth daily.    [provider]  atorvastatin (LIPITOR) 40 MG tablet Take 1 tablet (40 mg total) by mouth daily at 6 PM. 05/14/15   Allayne Butcher, PA-C  hydrALAZINE (APRESOLINE) 25 MG tablet Take 1 tablet (25 mg total) by mouth 3 (three) times daily. Patient not taking: Reported on 05/10/2017 01/19/17 01/29/17  Antony Madura, PA-C  HYDROcodone-acetaminophen (NORCO/VICODIN) 5-325 MG tablet Take 1-2 tablets by mouth every 6 (six) hours as needed for moderate pain. 05/10/17   Vanetta Mulders, MD  isosorbide mononitrate (IMDUR) 60 MG 24 hr tablet Take 90 mg by mouth daily.    [provider]  lisinopril (PRINIVIL,ZESTRIL) 5 MG tablet Take 5 mg by mouth daily.     [provider]  mirtazapine (REMERON) 15 MG tablet Take 15 mg by mouth at bedtime.    [provider]  nitroGLYCERIN (NITROSTAT) 0.4 MG SL tablet Place 1 tablet (0.4 mg total) under the tongue every 5 (five) minutes as needed. For chest pain 10/27/15   Lars Masson, MD  ondansetron (ZOFRAN ODT) 4 MG disintegrating tablet Take 1 tablet (4 mg total) by mouth every 8 (eight) hours as needed for nausea or vomiting. 05/10/17   Vanetta Mulders, MD  potassium chloride (K-DUR) 10 MEQ tablet Take 1 tablet (10 mEq total) by mouth daily. Patient not taking: Reported on 01/18/2017 02/26/16   Lars Masson, MD  Tamsulosin HCl (FLOMAX) 0.4 MG CAPS Take 0.4 mg by mouth daily after supper.     [provider]    Family History  Family History  Problem Relation Age of Onset  . Cancer Brother 60       oral   . Prostate cancer Brother   . Hypertension Brother   . Hyperlipidemia Brother   . Lung disease Father        black lung disease    Social History Social History  Substance Use Topics  . Smoking status: Current Every Day Smoker    Packs/day: 0.50    Years: 68.00    Types: Cigarettes    Last attempt to quit: 05/02/2015  . Smokeless tobacco: Never Used  . Alcohol use No     Allergies   Tramadol; Pravachol; Pravastatin; Simvastatin; Other; Oxycodone; and Terazosin   Review of Systems Review of Systems  Cardiovascular: Positive for chest pain.  Neurological: Positive for dizziness and weakness.  All other systems reviewed and are negative.    Physical Exam Updated Vital Signs BP (!) 173/74   Pulse 79   Temp 97.9 F (36.6 C) (Axillary)   Resp (!) 25   SpO2 100%   Physical Exam  Constitutional:  Chronically ill   HENT:  Head: Normocephalic.  Mouth/Throat: Oropharynx is clear and moist.  Eyes: Conjunctivae and EOM are normal. Pupils are equal, round, and reactive to light.  Neck: Normal range of motion. Neck supple.  Cardiovascular: Normal  rate, regular rhythm and normal heart sounds.   Pulmonary/Chest: Effort normal and breath sounds normal. No respiratory distress. He has no wheezes. He has no rales.  Abdominal: Soft. Bowel sounds are normal. He exhibits no distension. There is no tenderness. There is no guarding.  Musculoskeletal: Normal range of motion.  Neurological: He is alert.  CN 2-12 intact. Strength 4/5 bilateral upper and lower extremities.   Skin: Skin is warm.  Psychiatric: He has a normal mood and affect.  Nursing note and vitals reviewed.    ED Treatments / Results  Labs (all labs ordered are listed, but only abnormal results are displayed) Labs Reviewed  URINALYSIS, ROUTINE W REFLEX MICROSCOPIC - Abnormal; Notable for the following:       Result Value   Color, Urine STRAW (*)    Hgb urine dipstick MODERATE (*)    Protein, ur 30 (*)    All other components within normal limits  CBC - Abnormal; Notable for the following:    Platelets 124 (*)    All other components within normal limits  COMPREHENSIVE METABOLIC PANEL - Abnormal; Notable for the following:    Creatinine, Ser 1.65 (*)    Calcium 8.8 (*)    Albumin 3.4 (*)    ALT 9 (*)    GFR calc non Af Amer 35 (*)    GFR calc Af Amer 41 (*)    All other components within normal limits  I-STAT CHEM 8, ED - Abnormal; Notable for the following:    BUN 23 (*)    Creatinine, Ser 1.50 (*)    Calcium, Ion 1.08 (*)    All other components within normal limits  I-STAT TROPOININ, ED - Abnormal; Notable for the following:    Troponin i, poc 0.17 (*)    All other components within normal limits  PROTIME-INR  APTT  DIFFERENTIAL  RAPID URINE DRUG SCREEN, HOSP PERFORMED  ETHANOL    EKG  EKG Interpretation  Date/Time:  Tuesday May 31 2017 12:46:56 EDT Ventricular Rate:  91 PR Interval:    QRS Duration: 114 QT Interval:  435 QTC Calculation: 533 R Axis:   -70 Text  Interpretation:  A-V dual-paced complexes w/ some inhibition No further analysis  attempted due to paced rhythm No significant change since last tracing Confirmed by Richardean Canal [phone]) on 05/31/2017 12:59:26 PM Also confirmed by Richardean Canal [phone]), editor Misty Stanley [phone])  on 05/31/2017 1:00:16 PM       Radiology No results found.  Procedures Procedures (including critical care time)  Medications Ordered in ED Medications  sodium chloride 0.9 % bolus 1,000 mL (1,000 mLs Intravenous New Bag/Given 05/31/17 1308)  ondansetron (ZOFRAN) injection 4 mg (4 mg Intravenous Given 05/31/17 1308)     Initial Impression / Assessment and Plan / ED Course  I have reviewed the triage vital signs and the nursing notes.  Pertinent labs & imaging results that were available during my care of the patient were reviewed by me and considered in my medical decision making (see chart for details).    [name] is a 83 y.o. male here with weakness, dizziness, chest pain. Hx of CAD with CABG so high risk for NSTEMI or unstable angina. Had previous stroke and not on blood thinners. Will get labs, CT head. He has pacemaker and told me that he can't get MRI.   2:06 PM Trop 0.17, was 0.11 before. CT head unremarkable. Consulted cardiology to see patient. They recommend trending trop and hospitalist to admit given co morbidities.    Final Clinical Impressions(s) / ED Diagnoses   Final diagnoses:  None    New Prescriptions New Prescriptions   No medications on file     Charlynne Pander, MD 05/31/17 1406

## 2017-05-31 NOTE — H&P (Signed)
History and Physical    [name] ZOX:[id number] DOB: 1927-10-27 DOA: 05/31/2017   PCP: Delorse Lek, MD   Patient coming from:  Home    Chief Complaint: Dizziness   HPI: [name] is a 83 y.o. male with extensive cardiac history listed below, including PMP presenting with acute onset of dizziness, lightheadedness, as well as left shoulder and scapular pain and left lower rib pain He had presented 3 weeks ago to the ED with similar symptoms and essentially negative workup. Today, this symptoms are persistent No syncope or presyncope.  He denies nausea, vomiting. Has not tried anything for symptoms. No neck pain.   No diaphoresis  He reports chest pain  notworsened with deep inspiration, movement or exertion.  Reports some acute on chronic dyspnea, worse on exertion.  He also reports pain in the posterior ribs, that "comes and goes", similar to those when he had a kidney stone. Denies any cough. Denies any fever or chills. Denies any nausea, vomiting or abdominal pain. Appetite is normal and eats salt rich foods. Denies any leg swelling or calf pain. Denies any headaches or vision changes. Denies any seizures No confusion reported.  No new meds. Not on hormonal therapy.  No new herbal supplements.No tobacco, recreational drugs or ETOH    ED Course:  BP (!) 173/102   Pulse 60   Temp 97.9 F (36.6 C) (Axillary)   Resp 18   SpO2 96%    creatinine 1.5  white count 9.7 hemoglobin 16.3 platelets 124 troponin 0.17 EKG paced rythm PT 14.7 INR 1.14 glucose 92 UA motivated hemoglobin, negative nitrite last 2D echo EF 35 to 40%, moderately reduce systolic function CT of the head negative for acute intracranial abnormalities  CXR Increased interstitial markings bilaterally may reflect low-grade CHF or early interstitial pneumonia. There is no alveolar pneumonia  Review of Systems:  As per HPI otherwise all other systems reviewed and are negative  Past Medical History:  Diagnosis  Date  . AAA (abdominal aortic aneurysm) (HCC)    Has a known AAA of 3.2 x 3.4 cm by abdominal untrasound 03/27/12. Followup abdominal ultrasound on 03/29/13 showed a diameter of 3.6 cm, which is stable  . Abdominal bruit    Loud  . CAD (coronary artery disease)    a. s/p MI in 1988;  b. s/p CABG in 2000 (Dr. Barry Dienes) x 4, LIMA to LAD, SVG to diagonal, SVG to [facility], SVG to PDA;  c. 12/2013 NSTEMI/abnl MV;  d. 01/2014 Cath/PCI: native 3VD, VG->RCA 100, VG->OM aneurysmal 40ost/m, 4m(5.0x12 Veriflex BMS), VG->Diag aneurysmal, LIMA->LAD nl.  . Chronic systolic CHF (congestive heart failure) (HCC)    Hospitalization for CHF from 05/04/13-05/06/13. Treated medically. Had possible pneumonia with short course of antibiotics.  Marland Kitchen COPD (chronic obstructive pulmonary disease) (HCC)   . CVA (cerebral infarction) 07/2011   Remote left brain infarct with sensory deficits and had a left internal carotid stent placed by Dr. [name] at that time, no recurrent CVA or TIAs or CNS events.  . Headache   . Hyperlipidemia   . Hypertension   . Hypothyroid    On supplement "don't know if I take RX or not for my thyroid" (01/16/2014)  . Orthostatic hypotension   . PAD (peripheral artery disease) (HCC)    Stable. Had a past RFPBG by Dr. [name] in 2009. this graft is occluded with reconstitution in the popliteal artery, which was patent. Right tibial was occluded and 2-vessel runoff on angiography done by [name]  in 12/2010.  Marland Kitchen Peripheral vascular disease (HCC)   . Pneumonia   . Presence of permanent cardiac pacemaker   . Sick sinus syndrome (HCC)    PPM implanted for this and syncope   . Stroke (HCC)   . Syncope    tilt table test/8.10.01/orthostatic hypotension  . TIA (transient ischemic attack)     Past Surgical History:  Procedure Laterality Date  . ABDOMINAL AORTAGRAM N/A 02/22/2012   Procedure: ABDOMINAL Ronny Flurry;  Surgeon: Nada Libman, MD;  Location: [facility] CATH LAB;  Service: Cardiovascular;  Laterality: N/A;    . abdominal aortagram    . CARDIAC CATHETERIZATION  2000  . CAROTID ANGIOGRAM Bilateral 02/22/2012   Procedure: CAROTID ANGIOGRAM;  Surgeon: Nada Libman, MD;  Location: [facility] CATH LAB;  Service: Cardiovascular;  Laterality: Bilateral;  . CAROTID STENT Left 06/23/11   By [name]  . CATARACT EXTRACTION W/ INTRAOCULAR LENS  IMPLANT, BILATERAL Bilateral   . COLONOSCOPY    . CORONARY ANGIOPLASTY WITH STENT PLACEMENT  2001-01/16/2014   "think today makes #4" (01/16/2014)  . CORONARY ARTERY BYPASS GRAFT  2000   1st in 2000 (Dr. Barry Dienes) x 4, LIMA to LAD, SVG to diagonal, SVG to OM4, SVG to PDA. Re-cath in May 2012 with patent LIMA to LAD, patent SVG to diagonal, patent SVG to OM with left-to-right collaterals to an occluded right.  Marland Kitchen ENDARTERECTOMY FEMORAL Right 06/25/2015   Procedure: ENDARTERECTOMY RIGHT FEMORAL ARTERY ;  Surgeon: Larina Earthly, MD;  Location: [facility] OR;  Service: Vascular;  Laterality: Right;  . FEMORAL BYPASS Right ?2001  . INGUINAL HERNIA REPAIR Right 1980's  . INSERT / REPLACE / REMOVE PACEMAKER     implanted 2003 by Dr Jenne Campus with gen change (MDT [id number]) 08/2010 by [facility]  . LEFT HEART CATHETERIZATION WITH CORONARY/GRAFT ANGIOGRAM N/A 01/16/2014   Procedure: LEFT HEART CATHETERIZATION WITH Isabel Caprice;  Surgeon: Kathleene Hazel, MD;  Location: [facility] CATH LAB;  Service: Cardiovascular;  Laterality: N/A;  . PATCH ANGIOPLASTY Right 06/25/2015   Procedure: RIGHT COMMON FEMORAL ARTERY AND PROFUNDA PATCH ANGIOPLASTY USING HEMASHIELD PLATINUM FINESSE PATCH ;  Surgeon: Larina Earthly, MD;  Location: [facility] OR;  Service: Vascular;  Laterality: Right;  . PERIPHERAL VASCULAR CATHETERIZATION N/A 06/24/2015   Procedure: Abdominal Aortogram;  Surgeon: Nada Libman, MD;  Location: [facility] INVASIVE CV LAB;  Service: Cardiovascular;  Laterality: N/A;    Social History Social History   Social History  . Marital status: Widowed    Spouse name: N/A  . Number of children: 4  . Years of  education: N/A   Occupational History  . retired     Advice worker, Control and instrumentation engineer   Social History Main Topics  . Smoking status: Current Every Day Smoker    Packs/day: 0.50    Years: 68.00    Types: Cigarettes    Last attempt to quit: 05/02/2015  . Smokeless tobacco: Never Used  . Alcohol use No  . Drug use: No  . Sexual activity: Yes   Other Topics Concern  . Not on file   Social History Narrative   Lives in [place], spouse is now deceased.  Receives most care at the [facility].   4 children, 6 grandchildren     Allergies  Allergen Reactions  . Tramadol Nausea And Vomiting    Other reaction(s): Other (See Comments) unknown  . Pravachol Other (See Comments)    myalgias  . Pravastatin Other (See Comments)    myalgias  . Simvastatin  Other (See Comments)    myalgia  . Other Other (See Comments)    Other reaction(s): Other (See Comments) GREEN BEANS AND ONIONS GREEN BEANS AND ONIONS  . Oxycodone Other (See Comments)    Other reaction(s): Other (See Comments) Family member reports "he was highly agitated and disoriented" unknown Family member reports "he was highly agitated and disoriented"   . Terazosin Other (See Comments)    Other reaction(s): Other (See Comments) unknown unknown    Family History  Problem Relation Age of Onset  . Cancer Brother 60       oral   . Prostate cancer Brother   . Hypertension Brother   . Hyperlipidemia Brother   . Lung disease Father        black lung disease      Prior to Admission medications   Medication Sig Start Date End Date Taking? Authorizing Provider  acetaminophen (TYLENOL) 325 MG tablet Take 650 mg by mouth every 6 (six) hours as needed for mild pain. Reported on 11/25/2015    [provider]  acetaminophen-codeine (TYLENOL #3) 300-30 MG tablet Take 1-2 tablets by mouth every 6 (six) hours as needed for moderate pain. Patient not taking: Reported on 01/18/2017 09/30/16   Fayrene Helper, PA-C  amLODipine  (NORVASC) 10 MG tablet Take 10 mg by mouth daily.    [provider]  aspirin 81 MG tablet Take 81 mg by mouth daily.    [provider]  atorvastatin (LIPITOR) 40 MG tablet Take 1 tablet (40 mg total) by mouth daily at 6 PM. 05/14/15   Allayne Butcher, PA-C  hydrALAZINE (APRESOLINE) 25 MG tablet Take 1 tablet (25 mg total) by mouth 3 (three) times daily. Patient not taking: Reported on 05/10/2017 01/19/17 01/29/17  Antony Madura, PA-C  HYDROcodone-acetaminophen (NORCO/VICODIN) 5-325 MG tablet Take 1-2 tablets by mouth every 6 (six) hours as needed for moderate pain. 05/10/17   Vanetta Mulders, MD  isosorbide mononitrate (IMDUR) 60 MG 24 hr tablet Take 90 mg by mouth daily.    [provider]  lisinopril (PRINIVIL,ZESTRIL) 5 MG tablet Take 5 mg by mouth daily.    [provider]  mirtazapine (REMERON) 15 MG tablet Take 15 mg by mouth at bedtime.    [provider]  nitroGLYCERIN (NITROSTAT) 0.4 MG SL tablet Place 1 tablet (0.4 mg total) under the tongue every 5 (five) minutes as needed. For chest pain 10/27/15   Lars Masson, MD  ondansetron (ZOFRAN ODT) 4 MG disintegrating tablet Take 1 tablet (4 mg total) by mouth every 8 (eight) hours as needed for nausea or vomiting. 05/10/17   Vanetta Mulders, MD  potassium chloride (K-DUR) 10 MEQ tablet Take 1 tablet (10 mEq total) by mouth daily. Patient not taking: Reported on 01/18/2017 02/26/16   Lars Masson, MD  Tamsulosin HCl (FLOMAX) 0.4 MG CAPS Take 0.4 mg by mouth daily after supper.     [provider]    Physical Exam:  Vitals:   05/31/17 1247 05/31/17 1300 05/31/17 1400  BP: (!) 155/100 (!) 173/74 (!) 173/102  Pulse: 74 79 60  Resp: (!) 25 (!) 25 18  Temp: 97.9 F (36.6 C)    TempSrc: Axillary    SpO2: 100% 100% 96%   Constitutional: NAD, calm, comfortable, cachectic appearing  Eyes: PERRL, lids and conjunctivae normal ENMT: Mucous membranes are moist, without exudate or  lesions  Neck: normal, supple, no masses, no thyromegaly Respiratory: clear to auscultation bilaterally, no wheezing, no  crackles. Normal respiratory effort  Cardiovascular: Regular rate and rhythm, no murmurs, rubs or gallops. No extremity edema. 2+ pedal pulses. No carotid bruits.  Abdomen: Soft, non tender, No hepatosplenomegaly. Bowel sounds positive.  Musculoskeletal: no clubbing / cyanosis. Moves all extremities. Cannot reproduce the left lower rib pain . temporal wasting noted Skin: no jaundice, No lesions.  Neurologic: Sensation intact  Strength equal in all extremities Psychiatric:   Alert and oriented x 3. Normal mood.     Labs on Admission: I have personally reviewed following labs and imaging studies  CBC:  Recent Labs Lab 05/31/17 1250 05/31/17 1303  WBC 9.7  --   NEUTROABS 5.4  --   HGB 15.1 16.3  HCT 44.7 48.0  MCV 94.3  --   PLT 124*  --     Basic Metabolic Panel:  Recent Labs Lab 05/31/17 1250 05/31/17 1303  NA 139 141  K 3.6 3.6  CL 105 103  CO2 24  --   GLUCOSE 95 92  BUN 20 23*  CREATININE 1.65* 1.50*  CALCIUM 8.8*  --     GFR: CrCl cannot be calculated (Unknown ideal weight.).  Liver Function Tests:  Recent Labs Lab 05/31/17 1250  AST 22  ALT 9*  ALKPHOS 106  BILITOT 0.8  PROT 6.8  ALBUMIN 3.4*   No results for input(s): LIPASE, AMYLASE in the last 168 hours. No results for input(s): AMMONIA in the last 168 hours.  Coagulation Profile:  Recent Labs Lab 05/31/17 1250  INR 1.14    Cardiac Enzymes: No results for input(s): CKTOTAL, CKMB, CKMBINDEX, TROPONINI in the last 168 hours.  BNP (last 3 results) No results for input(s): PROBNP in the last 8760 hours.  HbA1C: No results for input(s): HGBA1C in the last 72 hours.  CBG: No results for input(s): GLUCAP in the last 168 hours.  Lipid Profile: No results for input(s): CHOL, HDL, LDLCALC, TRIG, CHOLHDL, LDLDIRECT in the last 72 hours.  Thyroid Function Tests: No  results for input(s): TSH, T4TOTAL, FREET4, T3FREE, THYROIDAB in the last 72 hours.  Anemia Panel: No results for input(s): VITAMINB12, FOLATE, FERRITIN, TIBC, IRON, RETICCTPCT in the last 72 hours.  Urine analysis:    Component Value Date/Time   COLORURINE STRAW (A) 05/31/2017 1252   APPEARANCEUR CLEAR 05/31/2017 1252   LABSPEC 1.006 05/31/2017 1252   PHURINE 8.0 05/31/2017 1252   GLUCOSEU NEGATIVE 05/31/2017 1252   HGBUR MODERATE (A) 05/31/2017 1252   BILIRUBINUR NEGATIVE 05/31/2017 1252   KETONESUR NEGATIVE 05/31/2017 1252   PROTEINUR 30 (A) 05/31/2017 1252   UROBILINOGEN 1.0 08/10/2015 2242   NITRITE NEGATIVE 05/31/2017 1252   LEUKOCYTESUR NEGATIVE 05/31/2017 1252    Sepsis Labs: @LABRCNTIP (procalcitonin:4,lacticidven:4) )No results found for this or any previous visit (from the past 240 hour(s)).   Radiological Exams on Admission: Dg Chest 2 View  Result Date: 05/31/2017 CLINICAL DATA:  Left-sided chest pain anteriorly, laterally, and posteriorly associated with shortness of breath. Symptoms have occurred intermittently since this morning. History of previous CABG. COPD. Current smoker. EXAM: CHEST  2 VIEW COMPARISON:  Chest x-ray of May 10, 2017 FINDINGS: The lungs are well-expanded. The interstitial markings are increased bilaterally. The retrocardiac region on the left remains dense. There is marked tortuosity of the ascending and descending thoracic aorta. The heart is normal in size. There are post CABG changes. There is calcification in the wall of the thoracic aorta. There may be a small left pleural effusion. IMPRESSION: Increased interstitial markings bilaterally may reflect low-grade  CHF or early interstitial pneumonia. There is no alveolar pneumonia. Previous CABG. Electronically Signed   By: David  [place] M.D.   On: 05/31/2017 14:34   Ct Head Wo Contrast  Result Date: 05/31/2017 CLINICAL DATA:  Pt states he has been dizzy and "swimmy headed". No headache. No trauma.  EXAM: CT HEAD WITHOUT CONTRAST TECHNIQUE: Contiguous axial images were obtained from the base of the skull through the vertex without intravenous contrast. COMPARISON:  11/17/2014 FINDINGS: Brain: No evidence of acute infarction, hemorrhage, hydrocephalus, extra-axial collection or mass lesion/mass effect. There is ventricular and sulcal enlargement reflecting age related volume loss. Patchy white matter hypoattenuation is noted consistent with mild to moderate chronic microvascular ischemic change. Vascular: No hyperdense vessel or unexpected calcification. Skull: Normal. Negative for fracture or focal lesion. Sinuses/Orbits: Globes and orbits are unremarkable. Visualized sinuses and mastoid air cells are clear. Other: None. IMPRESSION: 1. No acute intracranial abnormalities. 2. Age related volume loss. Mild to moderate chronic microvascular ischemic change. Electronically Signed   By: Amie Portland M.D.   On: 05/31/2017 13:55    EKG: Independently reviewed.  Assessment/Plan Active Problems:   Acute on chronic combined systolic and diastolic CHF (congestive heart failure) (HCC)   HTN (hypertension)   Hyperlipidemia   History of CVA (cerebrovascular accident)   Thrombocytopenia (HCC)   CAD (coronary artery disease) of artery bypass graft   Sick sinus syndrome (HCC) s/p Medtronic PPM   Acute bronchitis   Chronic combined systolic and diastolic CHF (congestive heart failure) (HCC)   NSTEMI (non-ST elevated myocardial infarction) (HCC)   Malnutrition of moderate degree (HCC)   COPD with acute exacerbation (HCC)   CHF (congestive heart failure) (HCC)   Essential hypertension   Orthostatic hypotension   Cardiac pacemaker in situ   Elevated troponin   PAD (peripheral artery disease) (HCC)   BPH (benign prostatic hyperplasia)   H/O endarterectomy   Faintness   Nausea   Coronary artery disease due to lipid rich plaque   Chest pain   Abdominal aortic aneurysm (AAA) (HCC)   Anxiety   Atrial  fibrillation (HCC)   BP (high blood pressure)   Dizziness: Etiology likely multifactorial including cardiac, vs medication induced. History of CVA but no other symptoms are noted,    Patient is  weak, and was unable to stand due to gait instability. Nausea controlled  CT head negative . MR could not be performed due to pacemaker. EKG without acute changes TN 0.17   Telemetry, observation Meclizine  And antiemetics when necessary dizziness  IVF   orthostatics    2D echo    EKG in am    Chest pain syndrome /CAD s/p CABG 2010, stents last cath 2015 Troponin 0.17, EKG without evidence of ACS; paced rythm. Did not receive Ntg or Morphine  Chest pain order set Cycle troponins EKG in am ASA, O2 and NTG as needed GI cocktail Check Lipid panel  Cardiology consult   Left lower rib pain, no fractures seen in X ray.  Non reproducible. No dysuria, but does have a history of kidney stones and CKD . UA with moderate  Blood  Check KUB  Pain control   Hypertension BP 156/88   Pulse 69   Controlled Continue home anti-hypertensive medications    Chronic kidney disease stage  III   baseline creatinine 1.5-1.7 at baseline, currently 1.5  Lab Results  Component Value Date   CREATININE 1.50 (H) 05/31/2017   CREATININE 1.65 (H) 05/31/2017   CREATININE 1.71 (H)  05/10/2017  IVF Repeat CMET in am   Hyperlipidemia Continue home statins   Chronic combined systolic and diastolic CHF  CXR without acute changes   last 2D echo EF 35 to 40%, moderately reduce systolic function. Appears complensated, weight 149 lbs  Lasix prn  monitor I/Os and daily weights prn 02  CXR findings   With  low-grade CHF or early interstitial pneumonia. There is no alveolar pneumonia. WBC norm al. Afebrile.  Will follow up with CXR in am   BPH, continue Flomax   Insomnia, continue Remeron    DVT prophylaxis: SCDS due to moderate blood in urine  Code Status:   Full    Family Communication:  Discussed with  patient Disposition Plan: Expect patient to be discharged to home after condition improves Consults called:   Cards per EDP  Admission status:Tele  Obs   Chi Health St. FrancisWERTMAN,Elmarie Devlin E, PA-C [facility]   05/31/2017, 2:42 PM

## 2017-05-31 NOTE — ED Triage Notes (Signed)
Per ems, pt from home, c/o dizziness nausea and L shoulder pain x1 week, getting worse. Daughter states hes been having tremors. AAOX4. Stroke screen negative. EKG unremarkable.

## 2017-05-31 NOTE — Progress Notes (Signed)
Patient arrived from ED hypertensive and with a non functioning peripheral IV. Prescribed IV fluids for 1500 were delayed.

## 2017-05-31 NOTE — ED Notes (Signed)
Attempted report 

## 2017-05-31 NOTE — Consult Note (Signed)
Cardiology Consultation:   Patient ID: [name]; [id number]; 12/16/1926   Admit date: 05/31/2017 Date of Consult: 05/31/2017  Primary Care Provider: Delorse Lek, MD Primary Cardiologist: Dr. Delton See Primary Electrophysiologist:  [facility] in [place], [place] VVS: Dr. [name]   Patient Profile:   [name] is a 83 y.o. male with a hx of CAD s/p CABG (2000), CKD III, chronic systolic heart failure, PAD, prior stroke, COPD, hypothyroidism, symptomatic bradycardia and syncope s/p PPM, tobacco abuse, thoracic and infrarenal abdominal aortic aneurysm. NSTEMI PCI with BMS to SVG-OM (2015).  [name]  is being seen today for the evaluation of chest pain and elevated troponin at the request of Dr. Silverio Lay (Emergency Medicine).  History of Present Illness:   [name] is an 83 year old male who receives some of his care at the [place]. All of his records are not available to [place] at this time. He is difficult to interview as his answer frequently change or are vague.   He presented to the ER for left posterior shoulder pain (CP equivalent per ER physician), dizziness and swimmy headed sensation persisting since this past Wednesday. Per his daughter, he often gets chest pains on and off but starting this morning around 10 am he developed  pain "deep" under his shoulder blade that has been constant and associated with SOB, nausea and diaphoresis. He has also had a cough recently. He denies having had any persisting anterior chest pains.  He takes Imdur at home. He has an Rx NTG which he did not take any. His initial Troponin is elevated to 0.11 << 0.17, it appears to be chronically elevated around this level -- was higher in 2017 . He had a Head CT, which was negative for acute stroke.  No BNP value this admission. Creatinine 1.50 (which appears to be close to his baseline). Chest xray with increased interstitial markings bilaterally concerning for low-grade CHF or early pneumonia, no acute changes.  Echocardiogram from April (2017) EF 35-40%, increased right ventricular systolic pressure consistent with moderate pulmonary hypertension.  He is overall a frail appearing male but in no acute distress., BP 156/88, 02 98% on room air, pulse 60-70s, afebrile. Chronic le swelling, unchanged from baseline.  Past Medical History:  Diagnosis Date  . AAA (abdominal aortic aneurysm) (HCC)    Has a known AAA of 3.2 x 3.4 cm by abdominal untrasound 03/27/12. Followup abdominal ultrasound on 03/29/13 showed a diameter of 3.6 cm, which is stable  . Abdominal bruit    Loud  . CAD (coronary artery disease)    a. s/p MI in 1988;  b. s/p CABG in 2000 (Dr. Barry Dienes) x 4, LIMA to LAD, SVG to diagonal, SVG to [facility], SVG to PDA;  c. 12/2013 NSTEMI/abnl MV;  d. 01/2014 Cath/PCI: native 3VD, VG->RCA 100, VG->OM aneurysmal 40ost/m, 82m(5.0x12 Veriflex BMS), VG->Diag aneurysmal, LIMA->LAD nl.  . Chronic systolic CHF (congestive heart failure) (HCC)    Hospitalization for CHF from 05/04/13-05/06/13. Treated medically. Had possible pneumonia with short course of antibiotics.  Marland Kitchen COPD (chronic obstructive pulmonary disease) (HCC)   . CVA (cerebral infarction) 07/2011   Remote left brain infarct with sensory deficits and had a left internal carotid stent placed by Dr. [name] at that time, no recurrent CVA or TIAs or CNS events.  . Headache   . Hyperlipidemia   . Hypertension   . Hypothyroid    On supplement "don't know if I take RX or not for my thyroid" (01/16/2014)  . Orthostatic  hypotension   . PAD (peripheral artery disease) (HCC)    Stable. Had a past RFPBG by Dr. [name] in 2009. this graft is occluded with reconstitution in the popliteal artery, which was patent. Right tibial was occluded and 2-vessel runoff on angiography done by [name] in 12/2010.  Marland Kitchen Peripheral vascular disease (HCC)   . Pneumonia   . Presence of permanent cardiac pacemaker   . Sick sinus syndrome (HCC)    PPM implanted for this and syncope   .  Stroke (HCC)   . Syncope    tilt table test/8.10.01/orthostatic hypotension  . TIA (transient ischemic attack)     Past Surgical History:  Procedure Laterality Date  . ABDOMINAL AORTAGRAM N/A 02/22/2012   Procedure: ABDOMINAL Ronny Flurry;  Surgeon: Nada Libman, MD;  Location: [facility] CATH LAB;  Service: Cardiovascular;  Laterality: N/A;  . abdominal aortagram    . CARDIAC CATHETERIZATION  2000  . CAROTID ANGIOGRAM Bilateral 02/22/2012   Procedure: CAROTID ANGIOGRAM;  Surgeon: Nada Libman, MD;  Location: [facility] CATH LAB;  Service: Cardiovascular;  Laterality: Bilateral;  . CAROTID STENT Left 06/23/11   By [name]  . CATARACT EXTRACTION W/ INTRAOCULAR LENS  IMPLANT, BILATERAL Bilateral   . COLONOSCOPY    . CORONARY ANGIOPLASTY WITH STENT PLACEMENT  2001-01/16/2014   "think today makes #4" (01/16/2014)  . CORONARY ARTERY BYPASS GRAFT  2000   1st in 2000 (Dr. Barry Dienes) x 4, LIMA to LAD, SVG to diagonal, SVG to OM4, SVG to PDA. Re-cath in May 2012 with patent LIMA to LAD, patent SVG to diagonal, patent SVG to OM with left-to-right collaterals to an occluded right.  Marland Kitchen ENDARTERECTOMY FEMORAL Right 06/25/2015   Procedure: ENDARTERECTOMY RIGHT FEMORAL ARTERY ;  Surgeon: Larina Earthly, MD;  Location: [facility] OR;  Service: Vascular;  Laterality: Right;  . FEMORAL BYPASS Right ?2001  . INGUINAL HERNIA REPAIR Right 1980's  . INSERT / REPLACE / REMOVE PACEMAKER     implanted 2003 by Dr Jenne Campus with gen change (MDT [id number]) 08/2010 by [facility]  . LEFT HEART CATHETERIZATION WITH CORONARY/GRAFT ANGIOGRAM N/A 01/16/2014   Procedure: LEFT HEART CATHETERIZATION WITH Isabel Caprice;  Surgeon: Kathleene Hazel, MD;  Location: [facility] CATH LAB;  Service: Cardiovascular;  Laterality: N/A;  . PATCH ANGIOPLASTY Right 06/25/2015   Procedure: RIGHT COMMON FEMORAL ARTERY AND PROFUNDA PATCH ANGIOPLASTY USING HEMASHIELD PLATINUM FINESSE PATCH ;  Surgeon: Larina Earthly, MD;  Location: [facility] OR;  Service: Vascular;  Laterality: Right;   . PERIPHERAL VASCULAR CATHETERIZATION N/A 06/24/2015   Procedure: Abdominal Aortogram;  Surgeon: Nada Libman, MD;  Location: [facility] INVASIVE CV LAB;  Service: Cardiovascular;  Laterality: N/A;     Inpatient Medications: Scheduled Meds:  Continuous Infusions:  PRN Meds:   Allergies:    Allergies  Allergen Reactions  . Tramadol Nausea And Vomiting    Other reaction(s): Other (See Comments) unknown  . Pravachol Other (See Comments)    myalgias  . Pravastatin Other (See Comments)    myalgias  . Simvastatin Other (See Comments)    myalgia  . Other Other (See Comments)    Other reaction(s): Other (See Comments) GREEN BEANS AND ONIONS GREEN BEANS AND ONIONS  . Oxycodone Other (See Comments)    Other reaction(s): Other (See Comments) Family member reports "he was highly agitated and disoriented" unknown Family member reports "he was highly agitated and disoriented"   . Terazosin Other (See Comments)    Other reaction(s): Other (See Comments) unknown unknown  Social History:   Social History   Social History  . Marital status: Widowed    Spouse name: N/A  . Number of children: 4  . Years of education: N/A   Occupational History  . retired     Advice worker, Control and instrumentation engineer   Social History Main Topics  . Smoking status: Current Every Day Smoker    Packs/day: 0.50    Years: 68.00    Types: Cigarettes    Last attempt to quit: 05/02/2015  . Smokeless tobacco: Never Used  . Alcohol use No  . Drug use: No  . Sexual activity: Yes   Other Topics Concern  . Not on file   Social History Narrative   Lives in [place], spouse is now deceased.  Receives most care at the [facility].   4 children, 6 grandchildren    Family History:   The patient's family history includes Cancer (age of onset: 37) in his brother; Hyperlipidemia in his brother; Hypertension in his brother; Lung disease in his father; Prostate cancer in his brother.  ROS:  Please see the history of present  illness.  All other ROS reviewed and negative.     Physical Exam/Data:   Vitals:   05/31/17 1247 05/31/17 1300 05/31/17 1400  BP: (!) 155/100 (!) 173/74 (!) 173/102  Pulse: 74 79 60  Resp: (!) 25 (!) 25 18  Temp: 97.9 F (36.6 C)    TempSrc: Axillary    SpO2: 100% 100% 96%   No intake or output data in the 24 hours ending 05/31/17 1420 There were no vitals filed for this visit. There is no height or weight on file to calculate BMI.  General: Well developed, well nourished, in no acute distress. Frail appearing.  Head: Normocephalic, atraumatic, sclera non-icteric, no xanthomas, nares are without discharge.  Neck: Negative for carotid bruits. JVD not elevated. Lungs: Clear bilaterally to auscultation without wheezes, rales, or rhonchi. Breathing is unlabored. Heart: RRR with S1 S2. No murmurs, rubs, or gallops appreciated. Abdomen: Soft, non-tender, non-distended with normoactive bowel sounds. No hepatomegaly. No rebound/guarding. No obvious abdominal masses. Msk:  Strength and tone appear normal for age. Extremities: No clubbing or cyanosis. No edema.  Distal pedal pulses are 2+ and equal bilaterally. Neuro: Alert and oriented X 3. No facial asymmetry. No focal deficit. Moves all extremities spontaneously. Psych:  Responds to questions appropriately with a normal affect.  EKG:  The EKG was personally reviewed and demonstrates HR 90. AV dual paced rhythm  Relevant CV Studies:  Transthoracic Echocardiography (03/13/2016)  Study Conclusions  - Left ventricle: The cavity size was normal. There was mild focal   basal hypertrophy of the septum. Systolic function was moderately   reduced. The estimated ejection fraction was in the range of 35%   to 40%. There is severe hypokinesis of the basal-midlateral   myocardium. There is akinesis of the basal-midinferior   myocardium. There is akinesis of the entireinferolateral   myocardium. Doppler parameters are consistent with high    ventricular filling pressure. - Aortic valve: Severe diffuse thickening and calcification   involving the right coronary and noncoronary cusp. There was   moderate regurgitation. - Mitral valve: There was mild to moderate regurgitation. - Right ventricle: Pacer wire or catheter noted in right ventricle. - Right atrium: The atrium was moderately dilated. - Tricuspid valve: There was mild-moderate regurgitation. - Pulmonic valve: There was trivial regurgitation. - Pulmonary arteries: PA peak pressure: 66 mm Hg (S).  Impressions:  - The right  ventricular systolic pressure was increased consistent   with moderate pulmonary hypertension.   Laboratory Data:  Chemistry Recent Labs Lab 05/31/17 1250 05/31/17 1303  NA 139 141  K 3.6 3.6  CL 105 103  CO2 24  --   GLUCOSE 95 92  BUN 20 23*  CREATININE 1.65* 1.50*  CALCIUM 8.8*  --   GFRNONAA 35*  --   GFRAA 41*  --   ANIONGAP 10  --      Recent Labs Lab 05/31/17 1250  PROT 6.8  ALBUMIN 3.4*  AST 22  ALT 9*  ALKPHOS 106  BILITOT 0.8   Hematology Recent Labs Lab 05/31/17 1250 05/31/17 1303  WBC 9.7  --   RBC 4.74  --   HGB 15.1 16.3  HCT 44.7 48.0  MCV 94.3  --   MCH 31.9  --   MCHC 33.8  --   RDW 14.6  --   PLT 124*  --    Recent Labs Lab 05/31/17 1301  TROPIPOC 0.17*    Radiology/Studies:  Ct Head Wo Contrast  Result Date: 05/31/2017 CLINICAL DATA:  Pt states he has been dizzy and "swimmy headed". No headache. No trauma. EXAM: CT HEAD WITHOUT CONTRAST TECHNIQUE: Contiguous axial images were obtained from the base of the skull through the vertex without intravenous contrast. COMPARISON:  11/17/2014 FINDINGS: Brain: No evidence of acute infarction, hemorrhage, hydrocephalus, extra-axial collection or mass lesion/mass effect. There is ventricular and sulcal enlargement reflecting age related volume loss. Patchy white matter hypoattenuation is noted consistent with mild to moderate chronic microvascular  ischemic change. Vascular: No hyperdense vessel or unexpected calcification. Skull: Normal. Negative for fracture or focal lesion. Sinuses/Orbits: Globes and orbits are unremarkable. Visualized sinuses and mastoid air cells are clear. Other: None. IMPRESSION: 1. No acute intracranial abnormalities. 2. Age related volume loss. Mild to moderate chronic microvascular ischemic change. Electronically Signed   By: Amie Portlandavid  Ormond M.D.   On: 05/31/2017 13:55    Assessment and Plan:   1. CAD s/p CABG in 2010 and prior stenting // most recent caths 12/2013 NSTEMI/abnl MV;   01/2014 Cath/PCI: native 3VD, VG->RCA 100, VG->OM aneurysmal 40ost/m, 6061m(5.0x12 Veriflex BMS), VG->Diag aneurysmal, LIMA->LAD nl. He has elevated Troponin levels, original 0.11 << delta 0.17, his troponins are chronically elevated at this level.  He continues to have CP, given a Vicodin by admitted service. -- at this time we recommend continuing to cycle enzymes.   2. Chronic systolic heart failure: cardiomegaly with congestion on chest xray. No BNP ordered, pt is having some shortness of breath with retrocardiac density that is unchanged on chest xray. Does not take lasix at home chronically. Do not feel that he needs at this time.   3. Hypertension:  sBP 150s // on Norvasc 10 mg, hydralazine 25 mg, lisinopril 5mg  at home  Signed, Dorthula MatasGREENE,TIFFANY G, PA-C  05/31/2017 2:20 PM  Patient seen, examined. Available data reviewed. Agree with findings, assessment, and plan as outlined by Marlon Peliffany Greene, PA-C. My exam today: Vitals:   05/31/17 1500 05/31/17 1542  BP: (!) 174/75 (!) 164/85  Pulse: (!) 39   Resp: 14   Temp:     Pt is alert and oriented, elderly male in NAD HEENT: normal Neck: JVP - normal Lungs: CTA bilaterally CV: RRR without murmur or gallop Abd: soft, NT, Positive BS, no hepatomegaly Ext: no C/C/E Skin: warm/dry no rash  EKG shows atrial pacing, nonspecific IVCD, no acute ST-T changes.  The patient has atypical  pain  in the left scapular region. His troponin is mildly elevated. Lab tests are reviewed over the last several years his troponin is always elevated dating back to 2015 when he has been hospitalized or evaluated in the emergency room for various complaints. His pain symptoms do have some characteristics of angina but it is very difficult to sort all of this out based on his history. Considering his advanced age and chronic kidney disease, I think it is best to treat him conservatively if possible. Will cycle enzymes and increase his antianginal therapy. He is on multidrug treatment with hydralazine, isosorbide, and amlodipine. In review of previous records he has been on a beta blocker but is no longer taking this at least based on his medicine list. Will resume metoprolol succinate at a dose of 50 mg daily. Will follow his symptoms with medical therapy. As he is pain-free at present, I don't think he needs IV heparin unless his enzymes truly show a peak and trough rather than a flat trend as they are currently doing. The patient's most recent cardiac catheterization from 2014 is reviewed when he underwent saphenous vein graft intervention with bare-metal stent. Will follow with you.  Tonny Bollman, M.D. 05/31/2017 4:16 PM

## 2017-06-01 DIAGNOSIS — I481 Persistent atrial fibrillation: Secondary | ICD-10-CM | POA: Diagnosis not present

## 2017-06-01 DIAGNOSIS — I25700 Atherosclerosis of coronary artery bypass graft(s), unspecified, with unstable angina pectoris: Secondary | ICD-10-CM

## 2017-06-01 DIAGNOSIS — I714 Abdominal aortic aneurysm, without rupture: Secondary | ICD-10-CM | POA: Diagnosis not present

## 2017-06-01 DIAGNOSIS — I5043 Acute on chronic combined systolic (congestive) and diastolic (congestive) heart failure: Secondary | ICD-10-CM

## 2017-06-01 DIAGNOSIS — R079 Chest pain, unspecified: Secondary | ICD-10-CM | POA: Diagnosis not present

## 2017-06-01 DIAGNOSIS — I2510 Atherosclerotic heart disease of native coronary artery without angina pectoris: Secondary | ICD-10-CM | POA: Diagnosis not present

## 2017-06-01 LAB — CBC
HEMATOCRIT: 38.9 % — AB (ref 39.0–52.0)
Hemoglobin: 12.7 g/dL — ABNORMAL LOW (ref 13.0–17.0)
MCH: 31.2 pg (ref 26.0–34.0)
MCHC: 32.6 g/dL (ref 30.0–36.0)
MCV: 95.6 fL (ref 78.0–100.0)
Platelets: 100 10*3/uL — ABNORMAL LOW (ref 150–400)
RBC: 4.07 MIL/uL — ABNORMAL LOW (ref 4.22–5.81)
RDW: 14.8 % (ref 11.5–15.5)
WBC: 7.6 10*3/uL (ref 4.0–10.5)

## 2017-06-01 LAB — COMPREHENSIVE METABOLIC PANEL
ALBUMIN: 2.7 g/dL — AB (ref 3.5–5.0)
ALT: 6 U/L — ABNORMAL LOW (ref 17–63)
AST: 15 U/L (ref 15–41)
Alkaline Phosphatase: 79 U/L (ref 38–126)
Anion gap: 7 (ref 5–15)
BUN: 19 mg/dL (ref 6–20)
CHLORIDE: 111 mmol/L (ref 101–111)
CO2: 23 mmol/L (ref 22–32)
Calcium: 8.1 mg/dL — ABNORMAL LOW (ref 8.9–10.3)
Creatinine, Ser: 1.81 mg/dL — ABNORMAL HIGH (ref 0.61–1.24)
GFR calc Af Amer: 37 mL/min — ABNORMAL LOW (ref >60)
GFR calc non Af Amer: 32 mL/min — ABNORMAL LOW (ref >60)
GLUCOSE: 79 mg/dL (ref 65–99)
POTASSIUM: 4.1 mmol/L (ref 3.5–5.1)
Sodium: 141 mmol/L (ref 135–145)
Total Bilirubin: 1 mg/dL (ref 0.3–1.2)
Total Protein: 5.5 g/dL — ABNORMAL LOW (ref 6.5–8.1)

## 2017-06-01 MED ORDER — ENOXAPARIN SODIUM 30 MG/0.3ML ~~LOC~~ SOLN
30.0000 mg | SUBCUTANEOUS | Status: DC
  Administered 2017-06-01: 30 mg via SUBCUTANEOUS
  Filled 2017-06-01: qty 0.3

## 2017-06-01 MED ORDER — METHOCARBAMOL 500 MG PO TABS: 500.0000 mg | ORAL_TABLET | Freq: Three times a day (TID) | ORAL | Status: DC | PRN

## 2017-06-01 MED ORDER — ASPIRIN EC 81 MG PO TBEC
81.0000 mg | DELAYED_RELEASE_TABLET | Freq: Every day | ORAL | Status: DC
  Administered 2017-06-01 – 2017-06-02 (×2): 81 mg via ORAL
  Filled 2017-06-01 (×2): qty 1

## 2017-06-01 NOTE — Progress Notes (Signed)
[facility] PROGRESS NOTE  [name] ION:[id number] DOB: 08-12-1927 DOA: 05/31/2017   PCP: Delorse Lek, MD     Assessment/Plan: Active Problems:   Acute on chronic combined systolic and diastolic CHF (congestive heart failure) (HCC)   HTN (hypertension)   Hyperlipidemia   History of CVA (cerebrovascular accident)   Thrombocytopenia (HCC)   CAD (coronary artery disease) of artery bypass graft   Sick sinus syndrome (HCC) s/p [employer] PPM   Acute bronchitis   Chronic combined systolic and diastolic CHF (congestive heart failure) (HCC)   NSTEMI (non-ST elevated myocardial infarction) (HCC)   Malnutrition of moderate degree (HCC)   COPD with acute exacerbation (HCC)   CHF (congestive heart failure) (HCC)   Essential hypertension   Orthostatic hypotension   Cardiac pacemaker in situ   Elevated troponin   PAD (peripheral artery disease) (HCC)   BPH (benign prostatic hyperplasia)   H/O endarterectomy   Faintness   Nausea   Coronary artery disease due to lipid rich plaque   Chest pain   Abdominal aortic aneurysm (AAA) (HCC)   Anxiety   Atrial fibrillation (HCC)   BP (high blood pressure)   Dizziness    83 y.o. male hx of CAD s/p CABG (2000), CKD III, chronic systolic heart failure, PAD, prior stroke, COPD, hypothyroidism, symptomatic bradycardia and syncope s/p PPM, tobacco abuse, thoracic and infrarenal abdominal aortic aneurysm who presented with chest pain ,swimmy headed sensation persisting since this past Wednesday, and chronically elevated troponin.   Assessment/Plan Dizziness:   History of CVA but no other symptoms are noted, CT of the head negative . MR could not be performed due to pacemaker. EKG without acute changes TN 0.17  Telemetry shows normal sinus rhythm PT OT eval Orthostatics negative 2-D echo pending  Chest pain syndrome /CAD s/p CABG 2010, stents last cath 2015 Troponin 0.17, EKG without evidence of ACS; paced rythm. Patient seen by  cardiology Heparin drip discontinued Multidrug therapy to control anginal symptoms recommended, continue metoprolol,imdur, Norvasc, Hold  ACE inhibitor due to increase in renal fn Appreciate cardiology input   Left lower rib pain, no fractures seen in X ray.  Non reproducible. No dysuria, but does have a history of kidney stones and CKD . UA negative, except for microscopic hematuria KUB shows constipation    Hypertension  Controlled Continue home anti-hypertensive medications      Chronic kidney disease stage  III   baseline creatinine 1.5-1.7 at baseline, currently 1.5    Hyperlipidemia Continue home statins   Chronic combined systolic and diastolic CHF  CXR without acute changes   last 2D echo EF 35 to 40%, moderately reduce systolic function. Appears complensated, weight 149 lbs  Lasix prn  monitor I/Os and daily weights Appears compensated   CXR findings   With  low-grade CHF or early interstitial pneumonia. There is no alveolar pneumonia. WBC norm al. Afebrile.     BPH, continue Flomax   Insomnia, continue Remeron     DVT prophylaxsis lovenox  Code Status:  Full code    Family Communication: Discussed in detail with the patient, all imaging results, lab results explained to the patient   Disposition Plan  PT OT eval, anticipate discharge tomorrow      Consultants:  cardiology  Procedures:  None   Antibiotics: Anti-infectives    None         HPI/Subjective: Complaining of left sided chest pain, heparin discontinued  Objective: Vitals:   05/31/17 2125  05/31/17 2343 06/01/17 0637 06/01/17 0748  BP: 130/76 113/60 138/79 125/76  Pulse: 62 60 65 73  Resp: 20 18 16    Temp: 97.9 F (36.6 C) 97.8 F (36.6 C) 98.3 F (36.8 C) 98.4 F (36.9 C)  TempSrc: Oral Oral Oral Oral  SpO2: 94% 96% 95% 96%  Weight:   64.9 kg (143 lb 1.6 oz)   Height:        Intake/Output Summary (Last 24 hours) at 06/01/17 1241 Last data filed at 06/01/17  0900  Gross per 24 hour  Intake           2202.5 ml  Output              400 ml  Net           1802.5 ml     Examination:  General exam: Appears calm and comfortable  Respiratory system: Clear to auscultation. Respiratory effort normal. Cardiovascular system: S1 & S2 heard, RRR. No JVD, murmurs, rubs, gallops or clicks. No pedal edema. Gastrointestinal system: Abdomen is nondistended, soft and nontender. No organomegaly or masses felt. Normal bowel sounds heard. Central nervous system: Alert and oriented. No focal neurological deficits. Extremities: Symmetric 5 x 5 power. Skin: No rashes, lesions or ulcers Psychiatry: Judgement and insight appear normal. Mood & affect appropriate.     Data Reviewed: I have personally reviewed following labs and imaging studies  Micro Results No results found for this or any previous visit (from the past 240 hour(s)).  Radiology Reports Dg Chest 2 View  Result Date: 05/31/2017 CLINICAL DATA:  Left-sided chest pain anteriorly, laterally, and posteriorly associated with shortness of breath. Symptoms have occurred intermittently since this morning. History of previous CABG. COPD. Current smoker. EXAM: CHEST  2 VIEW COMPARISON:  Chest x-ray of May 10, 2017 FINDINGS: The lungs are well-expanded. The interstitial markings are increased bilaterally. The retrocardiac region on the left remains dense. There is marked tortuosity of the ascending and descending thoracic aorta. The heart is normal in size. There are post CABG changes. There is calcification in the wall of the thoracic aorta. There may be a small left pleural effusion. IMPRESSION: Increased interstitial markings bilaterally may reflect low-grade CHF or early interstitial pneumonia. There is no alveolar pneumonia. Previous CABG. Electronically Signed   By: David  SwazilandJordan M.D.   On: 05/31/2017 14:34   Dg Chest 2 View  Result Date: 05/10/2017 CLINICAL DATA:  Chest pain EXAM: CHEST  2 VIEW COMPARISON:   01/19/2017, 09/30/2016 FINDINGS: Post sternotomy changes. Left-sided pacing device as before. No acute consolidation or pleural effusion. Stable borderline cardiomegaly. Aneurysmal dilatation of the aorta with grossly stable mediastinal contour. No pneumothorax. IMPRESSION: 1. Cardiomegaly without infiltrate or edema 2. Grossly stable aneurysmal dilatation of the aorta. Electronically Signed   By: Jasmine PangKim  Fujinaga M.D.   On: 05/10/2017 18:07   Ct Head Wo Contrast  Result Date: 05/31/2017 CLINICAL DATA:  Pt states he has been dizzy and "swimmy headed". No headache. No trauma. EXAM: CT HEAD WITHOUT CONTRAST TECHNIQUE: Contiguous axial images were obtained from the base of the skull through the vertex without intravenous contrast. COMPARISON:  11/17/2014 FINDINGS: Brain: No evidence of acute infarction, hemorrhage, hydrocephalus, extra-axial collection or mass lesion/mass effect. There is ventricular and sulcal enlargement reflecting age related volume loss. Patchy white matter hypoattenuation is noted consistent with mild to moderate chronic microvascular ischemic change. Vascular: No hyperdense vessel or unexpected calcification. Skull: Normal. Negative for fracture or focal lesion. Sinuses/Orbits: Globes and orbits  are unremarkable. Visualized sinuses and mastoid air cells are clear. Other: None. IMPRESSION: 1. No acute intracranial abnormalities. 2. Age related volume loss. Mild to moderate chronic microvascular ischemic change. Electronically Signed   By: Amie Portland M.D.   On: 05/31/2017 13:55   Ct Lumbar Spine Wo Contrast  Result Date: 05/10/2017 CLINICAL DATA:  Low back pain radiating to both lower extremities. EXAM: CT LUMBAR SPINE WITHOUT CONTRAST TECHNIQUE: Multidetector CT imaging of the lumbar spine was performed without intravenous contrast administration. Multiplanar CT image reconstructions were also generated. COMPARISON:  CT abdomen pelvis 01/28/2016 FINDINGS: Segmentation: Normal Alignment:  Normal Vertebrae: No acute compression fracture. Paraspinal and other soft tissues: There is an infrarenal abdominal aortic aneurysm that measures 4.4 cm. There is a left common iliac artery aneurysm measuring 4.0 cm. The sizes are unchanged. Extensive aortic and iliac artery atherosclerotic calcification. Disc levels: T12-L1: Normal disc space and facets. No spinal canal or neuroforaminal stenosis. L1-L2: Normal disc space and facets. No spinal canal or neuroforaminal stenosis. L2-L3: Small disc bulge with mild left neural foraminal stenosis. L3-L4: Moderate bilateral facet hypertrophy. Small disc bulge. No spinal canal stenosis. Mild-to-moderate bilateral foraminal narrowing. L4-L5: Severe bilateral facet hypertrophy with partially calcified ligamentum flavum. Small disc bulge. Mild spinal canal stenosis. Severe right and mild left neural foraminal stenosis. L5-S1: Severe facet hypertrophy. Right pars interarticularis defect. No spinal canal stenosis. Mild right foraminal stenosis. Visualized sacrum: Normal. IMPRESSION: 1. No acute abnormality of the lumbar spine. 2. Multilevel neural foraminal narrowing, predominantly caused by facet arthrosis, worst at right L4-5. 3. Multilevel severe facet arthrosis, which may be a source of localized back pain. 4. Unchanged size of infrarenal abdominal aortic and left common iliac artery aneurysms. Recommend followup by ultrasound in 1 year. This recommendation follows ACR consensus guidelines: White Paper of the ACR Incidental Findings Committee II on Vascular Findings. J Am Coll Radiol 2013; 10:789-794. Electronically Signed   By: Deatra Robinson M.D.   On: 05/10/2017 18:24   Dg Abd Portable 1v  Result Date: 05/31/2017 CLINICAL DATA:  History of renal stones. EXAM: PORTABLE ABDOMEN - 1 VIEW COMPARISON:  CT scan 01/28/2016 FINDINGS: Numerous vascular calcifications overlie the upper abdomen. Some of these project over the expected location of the kidneys. Underlying small  renal stones could be obscured. Nonspecific bowel gas pattern. IMPRESSION: Assessment for renal stones limited by overlying stool/ bowel gas and vascular calcification. Electronically Signed   By: Kennith Center M.D.   On: 05/31/2017 17:16     CBC  Recent Labs Lab 05/31/17 1250 05/31/17 1303 06/01/17 0451  WBC 9.7  --  7.6  HGB 15.1 16.3 12.7*  HCT 44.7 48.0 38.9*  PLT 124*  --  100*  MCV 94.3  --  95.6  MCH 31.9  --  31.2  MCHC 33.8  --  32.6  RDW 14.6  --  14.8  LYMPHSABS 3.7  --   --   MONOABS 0.5  --   --   EOSABS 0.2  --   --   BASOSABS 0.0  --   --     Chemistries   Recent Labs Lab 05/31/17 1250 05/31/17 1303 06/01/17 0451  NA 139 141 141  K 3.6 3.6 4.1  CL 105 103 111  CO2 24  --  23  GLUCOSE 95 92 79  BUN 20 23* 19  CREATININE 1.65* 1.50* 1.81*  CALCIUM 8.8*  --  8.1*  AST 22  --  15  ALT 9*  --  6*  ALKPHOS 106  --  79  BILITOT 0.8  --  1.0   ------------------------------------------------------------------------------------------------------------------ estimated creatinine clearance is 25.9 mL/min (A) (by C-G formula based on SCr of 1.81 mg/dL (H)). ------------------------------------------------------------------------------------------------------------------ No results for input(s): HGBA1C in the last 72 hours. ------------------------------------------------------------------------------------------------------------------  Recent Labs  05/31/17 1540  CHOL 155  HDL 48  LDLCALC 91  TRIG 82  CHOLHDL 3.2   ------------------------------------------------------------------------------------------------------------------ No results for input(s): TSH, T4TOTAL, T3FREE, THYROIDAB in the last 72 hours.  Invalid input(s): FREET3 ------------------------------------------------------------------------------------------------------------------ No results for input(s): VITAMINB12, FOLATE, FERRITIN, TIBC, IRON, RETICCTPCT in the last 72  hours.  Coagulation profile  Recent Labs Lab 05/31/17 1250  INR 1.14    No results for input(s): DDIMER in the last 72 hours.  Cardiac Enzymes  Recent Labs Lab 05/31/17 1540 05/31/17 1727 05/31/17 2011  TROPONINI 0.21* 0.21* 0.21*   ------------------------------------------------------------------------------------------------------------------ Invalid input(s): POCBNP   CBG: No results for input(s): GLUCAP in the last 168 hours.     Studies: Dg Chest 2 View  Result Date: 05/31/2017 CLINICAL DATA:  Left-sided chest pain anteriorly, laterally, and posteriorly associated with shortness of breath. Symptoms have occurred intermittently since this morning. History of previous CABG. COPD. Current smoker. EXAM: CHEST  2 VIEW COMPARISON:  Chest x-ray of May 10, 2017 FINDINGS: The lungs are well-expanded. The interstitial markings are increased bilaterally. The retrocardiac region on the left remains dense. There is marked tortuosity of the ascending and descending thoracic aorta. The heart is normal in size. There are post CABG changes. There is calcification in the wall of the thoracic aorta. There may be a small left pleural effusion. IMPRESSION: Increased interstitial markings bilaterally may reflect low-grade CHF or early interstitial pneumonia. There is no alveolar pneumonia. Previous CABG. Electronically Signed   By: David  [place] M.D.   On: 05/31/2017 14:34   Ct Head Wo Contrast  Result Date: 05/31/2017 CLINICAL DATA:  Pt states he has been dizzy and "swimmy headed". No headache. No trauma. EXAM: CT HEAD WITHOUT CONTRAST TECHNIQUE: Contiguous axial images were obtained from the base of the skull through the vertex without intravenous contrast. COMPARISON:  11/17/2014 FINDINGS: Brain: No evidence of acute infarction, hemorrhage, hydrocephalus, extra-axial collection or mass lesion/mass effect. There is ventricular and sulcal enlargement reflecting age related volume loss. Patchy  white matter hypoattenuation is noted consistent with mild to moderate chronic microvascular ischemic change. Vascular: No hyperdense vessel or unexpected calcification. Skull: Normal. Negative for fracture or focal lesion. Sinuses/Orbits: Globes and orbits are unremarkable. Visualized sinuses and mastoid air cells are clear. Other: None. IMPRESSION: 1. No acute intracranial abnormalities. 2. Age related volume loss. Mild to moderate chronic microvascular ischemic change. Electronically Signed   By: Amie Portland M.D.   On: 05/31/2017 13:55   Dg Abd Portable 1v  Result Date: 05/31/2017 CLINICAL DATA:  History of renal stones. EXAM: PORTABLE ABDOMEN - 1 VIEW COMPARISON:  CT scan 01/28/2016 FINDINGS: Numerous vascular calcifications overlie the upper abdomen. Some of these project over the expected location of the kidneys. Underlying small renal stones could be obscured. Nonspecific bowel gas pattern. IMPRESSION: Assessment for renal stones limited by overlying stool/ bowel gas and vascular calcification. Electronically Signed   By: Kennith Center M.D.   On: 05/31/2017 17:16      Lab Results  Component Value Date   HGBA1C 6.1 (H) 07/05/2011   HGBA1C 5.9 (H) 06/18/2011   HGBA1C  05/03/2011    5.6 (NOTE)  According to the ADA Clinical Practice Recommendations for 2011, when HbA1c is used as a screening test:   >=6.5%   Diagnostic of Diabetes Mellitus           (if abnormal result  is confirmed)  5.7-6.4%   Increased risk of developing Diabetes Mellitus  References:Diagnosis and Classification of Diabetes Mellitus,Diabetes Care,2011,34(Suppl 1):S62-S69 and Standards of Medical Care in         Diabetes - 2011,Diabetes Care,2011,34  (Suppl 1):S11-S61.   Lab Results  Component Value Date   LDLCALC 91 05/31/2017   CREATININE 1.81 (H) 06/01/2017       Scheduled Meds: . amLODipine  10 mg Oral Daily  . atorvastatin  40 mg Oral q1800   . isosorbide mononitrate  90 mg Oral Daily  . lisinopril  5 mg Oral Daily  . metoprolol succinate  50 mg Oral Daily  . mirtazapine  15 mg Oral QHS  . tamsulosin  0.4 mg Oral QPC supper   Continuous Infusions: . sodium chloride 75 mL/hr at 06/01/17 0900     LOS: 0 days    Time spent: >30 MINS    Richarda Overlie  [facility] Pager [phone]. If 7PM-7AM, please contact night-coverage at [email or web address], password [facility] 06/01/2017, 12:41 PM  LOS: 0 days

## 2017-06-01 NOTE — Care Management Obs Status (Signed)
MEDICARE OBSERVATION STATUS NOTIFICATION   Patient Details  Name: [name] MRN: [id number] Date of Birth: [birth year]   Medicare Observation Status Notification Given:  Yes    Gala LewandowskyGraves-Bigelow, Tatum Corl Kaye, RN 06/01/2017, 3:12 PM

## 2017-06-01 NOTE — Progress Notes (Signed)
Progress Note  Patient Name: [name] Date of Encounter: 06/01/2017  Primary Cardiologist: Delton See  Subjective   No further chest pain.   Inpatient Medications    Scheduled Meds: . amLODipine  10 mg Oral Daily  . atorvastatin  40 mg Oral q1800  . isosorbide mononitrate  90 mg Oral Daily  . lisinopril  5 mg Oral Daily  . metoprolol succinate  50 mg Oral Daily  . mirtazapine  15 mg Oral QHS  . tamsulosin  0.4 mg Oral QPC supper   Continuous Infusions: . sodium chloride 75 mL/hr at 06/01/17 0900   PRN Meds: acetaminophen, gi cocktail, HYDROcodone-acetaminophen, meclizine, ondansetron (ZOFRAN) IV   Vital Signs    Vitals:   05/31/17 2125 05/31/17 2343 06/01/17 0637 06/01/17 0748  BP: 130/76 113/60 138/79 125/76  Pulse: 62 60 65 73  Resp: 20 18 16    Temp: 97.9 F (36.6 C) 97.8 F (36.6 C) 98.3 F (36.8 C) 98.4 F (36.9 C)  TempSrc: Oral Oral Oral Oral  SpO2: 94% 96% 95% 96%  Weight:   143 lb 1.6 oz (64.9 kg)   Height:        Intake/Output Summary (Last 24 hours) at 06/01/17 1229 Last data filed at 06/01/17 0900  Gross per 24 hour  Intake           2202.5 ml  Output              400 ml  Net           1802.5 ml   Filed Weights   05/31/17 1617 06/01/17 0637  Weight: 145 lb 6.4 oz (66 kg) 143 lb 1.6 oz (64.9 kg)    Telemetry    SR with a pacing - Personally Reviewed  ECG    N/A - Personally Reviewed  Physical Exam   General: Thin older AA male appearing in no acute distress. Head: Normocephalic, atraumatic.  Neck: Supple without bruits, JVD. Lungs:  Resp regular and unlabored, CTA. Heart: RRR, S1, S2, no S3, S4, or murmur; no rub. Abdomen: Soft, non-tender, non-distended with normoactive bowel sounds. No hepatomegaly. No rebound/guarding. No obvious abdominal masses. Extremities: No clubbing, cyanosis, edema. Distal pedal pulses are 2+ bilaterally. Neuro: Alert and oriented X 3. Moves all extremities spontaneously. Psych: Normal affect.  Labs      Chemistry Recent Labs Lab 05/31/17 1250 05/31/17 1303 06/01/17 0451  NA 139 141 141  K 3.6 3.6 4.1  CL 105 103 111  CO2 24  --  23  GLUCOSE 95 92 79  BUN 20 23* 19  CREATININE 1.65* 1.50* 1.81*  CALCIUM 8.8*  --  8.1*  PROT 6.8  --  5.5*  ALBUMIN 3.4*  --  2.7*  AST 22  --  15  ALT 9*  --  6*  ALKPHOS 106  --  79  BILITOT 0.8  --  1.0  GFRNONAA 35*  --  32*  GFRAA 41*  --  37*  ANIONGAP 10  --  7     Hematology Recent Labs Lab 05/31/17 1250 05/31/17 1303 06/01/17 0451  WBC 9.7  --  7.6  RBC 4.74  --  4.07*  HGB 15.1 16.3 12.7*  HCT 44.7 48.0 38.9*  MCV 94.3  --  95.6  MCH 31.9  --  31.2  MCHC 33.8  --  32.6  RDW 14.6  --  14.8  PLT 124*  --  100*    Cardiac Enzymes Recent Labs Lab 05/31/17  1540 05/31/17 1727 05/31/17 2011  TROPONINI 0.21* 0.21* 0.21*    Recent Labs Lab 05/31/17 1301  TROPIPOC 0.17*     BNPNo results for input(s): BNP, PROBNP in the last 168 hours.   DDimer No results for input(s): DDIMER in the last 168 hours.    Radiology    Dg Chest 2 View  Result Date: 05/31/2017 CLINICAL DATA:  Left-sided chest pain anteriorly, laterally, and posteriorly associated with shortness of breath. Symptoms have occurred intermittently since this morning. History of previous CABG. COPD. Current smoker. EXAM: CHEST  2 VIEW COMPARISON:  Chest x-ray of May 10, 2017 FINDINGS: The lungs are well-expanded. The interstitial markings are increased bilaterally. The retrocardiac region on the left remains dense. There is marked tortuosity of the ascending and descending thoracic aorta. The heart is normal in size. There are post CABG changes. There is calcification in the wall of the thoracic aorta. There may be a small left pleural effusion. IMPRESSION: Increased interstitial markings bilaterally may reflect low-grade CHF or early interstitial pneumonia. There is no alveolar pneumonia. Previous CABG. Electronically Signed   By: David  [place] M.D.   On:  05/31/2017 14:34   Ct Head Wo Contrast  Result Date: 05/31/2017 CLINICAL DATA:  Pt states he has been dizzy and "swimmy headed". No headache. No trauma. EXAM: CT HEAD WITHOUT CONTRAST TECHNIQUE: Contiguous axial images were obtained from the base of the skull through the vertex without intravenous contrast. COMPARISON:  11/17/2014 FINDINGS: Brain: No evidence of acute infarction, hemorrhage, hydrocephalus, extra-axial collection or mass lesion/mass effect. There is ventricular and sulcal enlargement reflecting age related volume loss. Patchy white matter hypoattenuation is noted consistent with mild to moderate chronic microvascular ischemic change. Vascular: No hyperdense vessel or unexpected calcification. Skull: Normal. Negative for fracture or focal lesion. Sinuses/Orbits: Globes and orbits are unremarkable. Visualized sinuses and mastoid air cells are clear. Other: None. IMPRESSION: 1. No acute intracranial abnormalities. 2. Age related volume loss. Mild to moderate chronic microvascular ischemic change. Electronically Signed   By: Amie Portland M.D.   On: 05/31/2017 13:55   Dg Abd Portable 1v  Result Date: 05/31/2017 CLINICAL DATA:  History of renal stones. EXAM: PORTABLE ABDOMEN - 1 VIEW COMPARISON:  CT scan 01/28/2016 FINDINGS: Numerous vascular calcifications overlie the upper abdomen. Some of these project over the expected location of the kidneys. Underlying small renal stones could be obscured. Nonspecific bowel gas pattern. IMPRESSION: Assessment for renal stones limited by overlying stool/ bowel gas and vascular calcification. Electronically Signed   By: Kennith Center M.D.   On: 05/31/2017 17:16    Cardiac Studies   N/A  Patient Profile     83 y.o. male hx of CAD s/p CABG (2000), CKD III, chronic systolic heart failure, PAD, prior stroke, COPD, hypothyroidism, symptomatic bradycardia and syncope s/p PPM, tobacco abuse, thoracic and infrarenal abdominal aortic aneurysm who presented  with chest pain and chronically elevated troponin.   Assessment & Plan    1. Chest pain/Elevated troponin: Currently on multidrug medical therapy. Metoprolol was added back to medication regimen yesterday and tolerated well. Trop remain with flat non ACS trend. No further chest pain noted this admission.   - would continue with medical therapy-- BB, Imdur, ACEi, Amlodipine   2. Chronic systolic HF: CXR with no acute changes. Does not appear volume overloaded on exam. Weight stable.   3. Bradycardia s/p PPM: A pacing on telemetry. No arrhthymias noted.   4. Dizziness/Nausea: Work up per primary team   Signed,  Laverda PageLindsay Roberts, NP  06/01/2017, 12:29 PM    Patient seen, examined. Available data reviewed. Agree with findings, assessment, and plan as outlined by Laverda PageLindsay Roberts, NP-C. On my exam the patient is alert, elderly male in no distress. JVP is normal. Lungs are clear bilaterally. Heart is regular rate and rhythm with a 2/6 systolic ejection murmur at the left lower sternal border. Abdomen is soft and nontender. Extremities have no edema.  The patient has a flat, low level troponin elevation as has been seen during many of his past hospital evaluations. He has significant chronic kidney disease. His pain symptoms have some typical but mostly atypical features. I'm not sure that any other symptoms are cardiac-related. In any event, his medical program was modified yesterday and a beta blocker was restarted. He otherwise is on multi drug antianginal therapy. His blood pressure remains moderately elevated. I don't think he needs any further cardiac evaluation in the hospital. Would continue medical treatment for CAD.  Tonny BollmanMichael Erique Kaser, M.D. 06/01/2017 1:34 PM

## 2017-06-01 NOTE — Plan of Care (Signed)
Problem: Safety: Goal: Ability to remain free from injury will improve Outcome: Progressing Fall risk bundle in place. No injuries, falls or skin breakdown this shift.    

## 2017-06-02 ENCOUNTER — Other Ambulatory Visit (HOSPITAL_COMMUNITY): Payer: Non-veteran care

## 2017-06-02 ENCOUNTER — Observation Stay (HOSPITAL_COMMUNITY): Payer: Non-veteran care

## 2017-06-02 ENCOUNTER — Observation Stay (HOSPITAL_BASED_OUTPATIENT_CLINIC_OR_DEPARTMENT_OTHER): Payer: Medicare Other

## 2017-06-02 DIAGNOSIS — I5043 Acute on chronic combined systolic (congestive) and diastolic (congestive) heart failure: Secondary | ICD-10-CM | POA: Diagnosis not present

## 2017-06-02 DIAGNOSIS — I714 Abdominal aortic aneurysm, without rupture: Secondary | ICD-10-CM

## 2017-06-02 DIAGNOSIS — I350 Nonrheumatic aortic (valve) stenosis: Secondary | ICD-10-CM

## 2017-06-02 LAB — ECHOCARDIOGRAM COMPLETE
Height: 69 in
Weight: 2289.6 oz

## 2017-06-02 MED ORDER — MECLIZINE HCL 12.5 MG PO TABS: 12.5000 mg | ORAL_TABLET | Freq: Three times a day (TID) | ORAL | 0 refills | Status: DC | PRN

## 2017-06-02 MED ORDER — METOPROLOL SUCCINATE ER 50 MG PO TB24: 50.0000 mg | ORAL_TABLET | Freq: Every day | ORAL | 1 refills | Status: DC

## 2017-06-02 MED ORDER — METHOCARBAMOL 500 MG PO TABS: 500.0000 mg | ORAL_TABLET | Freq: Three times a day (TID) | ORAL | 1 refills | Status: DC | PRN

## 2017-06-02 NOTE — Plan of Care (Signed)
Problem: Health Behavior/Discharge Planning: Goal: Ability to manage health-related needs will improve Outcome: Progressing Vital Signs stable throughout shift. Denies any dizziness, CP, or SOB. Labs monitored. Will continue to monitor.

## 2017-06-02 NOTE — Progress Notes (Addendum)
Patient has low orthostatic blood pressures, continue to complain of dizziness while laying in bed, sitting and standing. Held all blood pressure lowering medications, notified  Dr. Excell Seltzerooper, MD for confirmation. Spoke with charge nurse, Dr. Susie CassetteAbrol notified, new orders received.

## 2017-06-02 NOTE — Progress Notes (Signed)
Patient discharged to home; verbalized understanding of all verbal and written discharge instructions to include follow up appointments. Left unit ambulating with nurse tech [place]. Ambulating with steady gait, no complaints of pain or dizziness at this time. Discussed picking up new medication at pharmacy. Discussed which medications needed to be taken this evening.

## 2017-06-02 NOTE — Progress Notes (Signed)
  Echocardiogram 2D Echocardiogram has been performed.  [name], [name] M 06/02/2017, 2:03 PM

## 2017-06-02 NOTE — Evaluation (Signed)
Physical Therapy Evaluation Patient Details Name: [name] MRN: [id number] DOB: 09-11-1927 Today's Date: 06/02/2017   History of Present Illness  Pt is an 83 y/o male admitted secondary to chest pain and dizziness. CT negative for acute abnormality. Pt with elevated troponin, however, per cardiology, will manage conservatively, and that elevated troponins is chronic. PMH includes NSTEMI and CAD s/p CABG, CKD, CVA, PAD, CHF, AAA s/p abdominal aortogram and tobacco abuse.   Clinical Impression  Pt admitted secondary to problem above with deficits below. PTA, pt was independent with ambulation. Upon evaluation, pt with slight balance deficits and on slight LOB, however, pt able to recover independently. Pt required min guard for safety throughout. Will need to attempt stair training to ensure safety with gait. Pt reporting he is at baseline with mobility. Educated to use Rollator or cane if balance off at home. No follow up PT recommendations, and pt has all necessary DME. Will continue to follow acutely to ensure safety with stair navigation and mobility.     Follow Up Recommendations No PT follow up;Supervision for mobility/OOB    Equipment Recommendations  None recommended by PT (has all necessary DME)    Recommendations for Other Services       Precautions / Restrictions Precautions Precautions: Fall Restrictions Weight Bearing Restrictions: No      Mobility  Bed Mobility Overal bed mobility: Modified Independent             General bed mobility comments: Increased time, but no assist required.   Transfers Overall transfer level: Needs assistance Equipment used: None Transfers: Sit to/from Stand Sit to Stand: Min guard         General transfer comment: Min guard for safety.   Ambulation/Gait Ambulation/Gait assistance: Min guard Ambulation Distance (Feet): 200 Feet Assistive device: None (IV pole ) Gait Pattern/deviations: Step-through pattern;Decreased stride  length Gait velocity: WNL Gait velocity interpretation: at or above normal speed for age/gender General Gait Details: Mild balance deficits initially, but able to self correct without assist. Initially using IV pole during ambulation, however, pt able to walk without IV pole once cued. Educated to use cane or rollator at home if he is having a day where his balance is decreased or he is having dizziness.   Stairs            Wheelchair Mobility    Modified Rankin (Stroke Patients Only)       Balance Overall balance assessment: Needs assistance Sitting-balance support: No upper extremity supported;Feet supported Sitting balance-Leahy Scale: Good     Standing balance support: No upper extremity supported;During functional activity Standing balance-Leahy Scale: Fair                               Pertinent Vitals/Pain Pain Assessment: No/denies pain    Home Living Family/patient expects to be discharged to:: Private residence Living Arrangements: Children Available Help at Discharge: Family;Available PRN/intermittently Type of Home: House Home Access: Stairs to enter Entrance Stairs-Rails: Left Entrance Stairs-Number of Steps: 2 Home Layout: One level Home Equipment: Walker - 4 wheels;Cane - single point;Shower seat;Bedside commode      Prior Function Level of Independence: Independent         Comments: Reports he has been working on fixing up his car  at home.      Hand Dominance   Dominant Hand: Right    Extremity/Trunk Assessment   Upper Extremity Assessment Upper Extremity Assessment: Defer to  OT evaluation    Lower Extremity Assessment Lower Extremity Assessment: Generalized weakness (Sensoryin tact; Grossly 4/5 throughout )    Cervical / Trunk Assessment Cervical / Trunk Assessment: Normal  Communication   Communication: No difficulties  Cognition Arousal/Alertness: Awake/alert Behavior During Therapy: WFL for tasks  assessed/performed Overall Cognitive Status: Within Functional Limits for tasks assessed                                        General Comments General comments (skin integrity, edema, etc.): VSS throughout; no dizziness reported.     Exercises     Assessment/Plan    PT Assessment Patient needs continued PT services  PT Problem List Decreased strength;Decreased balance;Decreased knowledge of use of DME;Decreased knowledge of precautions       PT Treatment Interventions DME instruction;Gait training;Stair training;Functional mobility training;Therapeutic activities;Therapeutic exercise;Balance training;Neuromuscular re-education;Patient/family education    PT Goals (Current goals can be found in the Care Plan section)  Acute Rehab PT Goals Patient Stated Goal: to go home  PT Goal Formulation: With patient Time For Goal Achievement: 06/09/17 Potential to Achieve Goals: Good    Frequency Min 3X/week   Barriers to discharge        Co-evaluation               AM-PAC PT "6 Clicks" Daily Activity  Outcome Measure Difficulty turning over in bed (including adjusting bedclothes, sheets and blankets)?: A Little Difficulty moving from lying on back to sitting on the side of the bed? : A Little Difficulty sitting down on and standing up from a chair with arms (e.g., wheelchair, bedside commode, etc,.)?: A Little Help needed moving to and from a bed to chair (including a wheelchair)?: A Little Help needed walking in hospital room?: A Little Help needed climbing 3-5 steps with a railing? : A Little 6 Click Score: 18    End of Session Equipment Utilized During Treatment: Gait belt Activity Tolerance: Patient tolerated treatment well Patient left: in bed;with call bell/phone within reach;with chair alarm set Nurse Communication: Mobility status PT Visit Diagnosis: Unsteadiness on feet (R26.81)    Time: [id number] PT Time Calculation (min) (ACUTE ONLY): 24  min   Charges:   PT Evaluation $PT Eval Low Complexity: 1 Procedure PT Treatments $Gait Training: 8-22 mins   PT G Codes:   PT G-Codes **NOT FOR INPATIENT CLASS** Functional Assessment Tool Used: AM-PAC 6 Clicks Basic Mobility;Clinical judgement Functional Limitation: Mobility: Walking and moving around Mobility: Walking and Moving Around Current Status ([id number]([id number]): At least 40 percent but less than 60 percent impaired, limited or restricted Mobility: Walking and Moving Around Goal Status [phone](G8979): At least 1 percent but less than 20 percent impaired, limited or restricted    Gladys DammeBrittany Belmont Valli, PT, DPT  Acute Rehabilitation Services  Pager: [phone]   Lehman PromBrittany S Yousif Edelson 06/02/2017, 12:29 PM

## 2017-06-02 NOTE — Discharge Summary (Addendum)
Physician Discharge Summary  [name] MRN: [id number] DOB/AGE: 13-Jul-1927 83 y.o.  PCP: Stephens Shire, MD   Admit date: 05/31/2017 Discharge date: 06/02/2017  Discharge Diagnoses:   Active Problems:   Acute on chronic combined systolic and diastolic CHF (congestive heart failure) (HCC)   HTN (hypertension)   Hyperlipidemia   History of CVA (cerebrovascular accident)   Thrombocytopenia (HCC)   CAD (coronary artery disease) of artery bypass graft   Sick sinus syndrome (HCC) s/p [employer] PPM   Acute bronchitis   Chronic combined systolic and diastolic CHF (congestive heart failure) (HCC)   NSTEMI (non-ST elevated myocardial infarction) (HCC)   Malnutrition of moderate degree (HCC)   COPD with acute exacerbation (HCC)   CHF (congestive heart failure) (HCC)   Essential hypertension   Orthostatic hypotension   Cardiac pacemaker in situ   Elevated troponin   PAD (peripheral artery disease) (HCC)   BPH (benign prostatic hyperplasia)   H/O endarterectomy   Faintness   Nausea   Coronary artery disease due to lipid rich plaque   Chest pain   Abdominal aortic aneurysm (AAA) (HCC)   Anxiety   Atrial fibrillation (HCC)   BP (high blood pressure)   Dizziness    Follow-up recommendations Follow-up with PCP in 3-5 days , including all  additional recommended appointments as below Follow-up CBC, CMP in 3-5 days Echo results pending at the time of discharge, PCP requested to kindly follow up on these Discussed with daughter on phone prior discharge     Current Discharge Medication List    START taking these medications   Details  meclizine (ANTIVERT) 12.5 MG tablet Take 1 tablet (12.5 mg total) by mouth 3 (three) times daily as needed for dizziness. Qty: 30 tablet, Refills: 0    methocarbamol (ROBAXIN) 500 MG tablet Take 1 tablet (500 mg total) by mouth every 8 (eight) hours as needed for muscle spasms. Qty: 30 tablet, Refills: 1    metoprolol succinate (TOPROL-XL)  50 MG 24 hr tablet Take 1 tablet (50 mg total) by mouth daily. Take with or immediately following a meal. Qty: 30 tablet, Refills: 1      CONTINUE these medications which have NOT CHANGED   Details  acetaminophen (TYLENOL) 325 MG tablet Take 650 mg by mouth every 6 (six) hours as needed for mild pain. Reported on 11/25/2015    amLODipine (NORVASC) 10 MG tablet Take 10 mg by mouth daily.    aspirin 81 MG tablet Take 81 mg by mouth daily.    atorvastatin (LIPITOR) 40 MG tablet Take 1 tablet (40 mg total) by mouth daily at 6 PM. Qty: 30 tablet, Refills: 5    HYDROcodone-acetaminophen (NORCO/VICODIN) 5-325 MG tablet Take 1-2 tablets by mouth every 6 (six) hours as needed for moderate pain. Qty: 14 tablet, Refills: 0    isosorbide mononitrate (IMDUR) 60 MG 24 hr tablet Take 90 mg by mouth daily.    mirtazapine (REMERON) 15 MG tablet Take 15 mg by mouth at bedtime.    nitroGLYCERIN (NITROSTAT) 0.4 MG SL tablet Place 1 tablet (0.4 mg total) under the tongue every 5 (five) minutes as needed. For chest pain Qty: 25 tablet, Refills: 3    ondansetron (ZOFRAN ODT) 4 MG disintegrating tablet Take 1 tablet (4 mg total) by mouth every 8 (eight) hours as needed for nausea or vomiting. Qty: 12 tablet, Refills: 1    Tamsulosin HCl (FLOMAX) 0.4 MG CAPS Take 0.4 mg by mouth daily after supper.  potassium chloride (K-DUR) 10 MEQ tablet Take 1 tablet (10 mEq total) by mouth daily. Qty: 90 tablet, Refills: 3      STOP taking these medications     lisinopril (PRINIVIL,ZESTRIL) 5 MG tablet          Discharge Condition: Stable   Discharge Instructions Get Medicines reviewed and adjusted: Please take all your medications with you for your next visit with your Primary MD  Please request your Primary MD to go over all hospital tests and procedure/radiological results at the follow up, please ask your Primary MD to get all Hospital records sent to his/her office.  If you experience worsening  of your admission symptoms, develop shortness of breath, life threatening emergency, suicidal or homicidal thoughts you must seek medical attention immediately by calling 911 or calling your MD immediately if symptoms less severe.  You must read complete instructions/literature along with all the possible adverse reactions/side effects for all the Medicines you take and that have been prescribed to you. Take any new Medicines after you have completely understood and accpet all the possible adverse reactions/side effects.   Do not drive when taking Pain medications.   Do not take more than prescribed Pain, Sleep and Anxiety Medications  Special Instructions: If you have smoked or chewed Tobacco in the last 2 yrs please stop smoking, stop any regular Alcohol and or any Recreational drug use.  Wear Seat belts while driving.  Please note  You were cared for by a hospitalist during your hospital stay. Once you are discharged, your primary care physician will handle any further medical issues. Please note that NO REFILLS for any discharge medications will be authorized once you are discharged, as it is imperative that you return to your primary care physician (or establish a relationship with a primary care physician if you do not have one) for your aftercare needs so that they can reassess your need for medications and monitor your lab values.  Discharge Instructions    Diet - low sodium heart healthy    Complete by:  As directed    Increase activity slowly    Complete by:  As directed        Allergies  Allergen Reactions  . Tramadol Nausea And Vomiting    Other reaction(s): Other (See Comments) unknown  . Pravachol Other (See Comments)    myalgias  . Pravastatin Other (See Comments)    myalgias  . Simvastatin Other (See Comments)    myalgia  . Other Other (See Comments)    Other reaction(s): Other (See Comments) GREEN BEANS AND ONIONS GREEN BEANS AND ONIONS  . Oxycodone Other  (See Comments)    Other reaction(s): Other (See Comments) Family member reports "he was highly agitated and disoriented" unknown Family member reports "he was highly agitated and disoriented"   . Terazosin Other (See Comments)    Other reaction(s): Other (See Comments) unknown unknown      Disposition: 01-Home or Self Care   Consults:  Cardiology*    Significant Diagnostic Studies:  Dg Chest 2 View  Result Date: 05/31/2017 CLINICAL DATA:  Left-sided chest pain anteriorly, laterally, and posteriorly associated with shortness of breath. Symptoms have occurred intermittently since this morning. History of previous CABG. COPD. Current smoker. EXAM: CHEST  2 VIEW COMPARISON:  Chest x-ray of May 10, 2017 FINDINGS: The lungs are well-expanded. The interstitial markings are increased bilaterally. The retrocardiac region on the left remains dense. There is marked tortuosity of the ascending and descending thoracic  aorta. The heart is normal in size. There are post CABG changes. There is calcification in the wall of the thoracic aorta. There may be a small left pleural effusion. IMPRESSION: Increased interstitial markings bilaterally may reflect low-grade CHF or early interstitial pneumonia. There is no alveolar pneumonia. Previous CABG. Electronically Signed   By: David  Martinique M.D.   On: 05/31/2017 14:34   Dg Chest 2 View  Result Date: 05/10/2017 CLINICAL DATA:  Chest pain EXAM: CHEST  2 VIEW COMPARISON:  01/19/2017, 09/30/2016 FINDINGS: Post sternotomy changes. Left-sided pacing device as before. No acute consolidation or pleural effusion. Stable borderline cardiomegaly. Aneurysmal dilatation of the aorta with grossly stable mediastinal contour. No pneumothorax. IMPRESSION: 1. Cardiomegaly without infiltrate or edema 2. Grossly stable aneurysmal dilatation of the aorta. Electronically Signed   By: Donavan Foil M.D.   On: 05/10/2017 18:07   Ct Head Wo Contrast  Result Date:  05/31/2017 CLINICAL DATA:  Pt states he has been dizzy and "swimmy headed". No headache. No trauma. EXAM: CT HEAD WITHOUT CONTRAST TECHNIQUE: Contiguous axial images were obtained from the base of the skull through the vertex without intravenous contrast. COMPARISON:  11/17/2014 FINDINGS: Brain: No evidence of acute infarction, hemorrhage, hydrocephalus, extra-axial collection or mass lesion/mass effect. There is ventricular and sulcal enlargement reflecting age related volume loss. Patchy white matter hypoattenuation is noted consistent with mild to moderate chronic microvascular ischemic change. Vascular: No hyperdense vessel or unexpected calcification. Skull: Normal. Negative for fracture or focal lesion. Sinuses/Orbits: Globes and orbits are unremarkable. Visualized sinuses and mastoid air cells are clear. Other: None. IMPRESSION: 1. No acute intracranial abnormalities. 2. Age related volume loss. Mild to moderate chronic microvascular ischemic change. Electronically Signed   By: Lajean Manes M.D.   On: 05/31/2017 13:55   Ct Lumbar Spine Wo Contrast  Result Date: 05/10/2017 CLINICAL DATA:  Low back pain radiating to both lower extremities. EXAM: CT LUMBAR SPINE WITHOUT CONTRAST TECHNIQUE: Multidetector CT imaging of the lumbar spine was performed without intravenous contrast administration. Multiplanar CT image reconstructions were also generated. COMPARISON:  CT abdomen pelvis 01/28/2016 FINDINGS: Segmentation: Normal Alignment: Normal Vertebrae: No acute compression fracture. Paraspinal and other soft tissues: There is an infrarenal abdominal aortic aneurysm that measures 4.4 cm. There is a left common iliac artery aneurysm measuring 4.0 cm. The sizes are unchanged. Extensive aortic and iliac artery atherosclerotic calcification. Disc levels: T12-L1: Normal disc space and facets. No spinal canal or neuroforaminal stenosis. L1-L2: Normal disc space and facets. No spinal canal or neuroforaminal stenosis.  L2-L3: Small disc bulge with mild left neural foraminal stenosis. L3-L4: Moderate bilateral facet hypertrophy. Small disc bulge. No spinal canal stenosis. Mild-to-moderate bilateral foraminal narrowing. L4-L5: Severe bilateral facet hypertrophy with partially calcified ligamentum flavum. Small disc bulge. Mild spinal canal stenosis. Severe right and mild left neural foraminal stenosis. L5-S1: Severe facet hypertrophy. Right pars interarticularis defect. No spinal canal stenosis. Mild right foraminal stenosis. Visualized sacrum: Normal. IMPRESSION: 1. No acute abnormality of the lumbar spine. 2. Multilevel neural foraminal narrowing, predominantly caused by facet arthrosis, worst at right L4-5. 3. Multilevel severe facet arthrosis, which may be a source of localized back pain. 4. Unchanged size of infrarenal abdominal aortic and left common iliac artery aneurysms. Recommend followup by ultrasound in 1 year. This recommendation follows ACR consensus guidelines: White Paper of the ACR Incidental Findings Committee II on Vascular Findings. J Am Coll Radiol 2013; 10:789-794. Electronically Signed   By: Ulyses Jarred M.D.   On: 05/10/2017 18:24  Dg Abd Portable 1v  Result Date: 05/31/2017 CLINICAL DATA:  History of renal stones. EXAM: PORTABLE ABDOMEN - 1 VIEW COMPARISON:  CT scan 01/28/2016 FINDINGS: Numerous vascular calcifications overlie the upper abdomen. Some of these project over the expected location of the kidneys. Underlying small renal stones could be obscured. Nonspecific bowel gas pattern. IMPRESSION: Assessment for renal stones limited by overlying stool/ bowel gas and vascular calcification. Electronically Signed   By: Misty Stanley M.D.   On: 05/31/2017 17:16    echocardiogram        Filed Weights   05/31/17 1617 06/01/17 0637  Weight: 66 kg (145 lb 6.4 oz) 64.9 kg (143 lb 1.6 oz)     Microbiology: No results found for this or any previous visit (from the past 240 hour(s)).      Blood Culture    Component Value Date/Time   SDES URINE, RANDOM 05/10/2017 1536   SPECREQUEST NONE 05/10/2017 1536   CULT (A) 05/10/2017 1536    <10,000 COLONIES/mL INSIGNIFICANT GROWTH Performed at [facility] [address], [place], [place] [address]    REPTSTATUS 05/12/2017 FINAL 05/10/2017 1536      Labs: Results for orders placed or performed during the hospital encounter of 05/31/17 (from the past 48 hour(s))  I-Stat Chem 8, ED  (not at [facility], [facility])     Status: Abnormal   Collection Time: 05/31/17  1:03 PM  Result Value Ref Range   Sodium 141 135 - 145 mmol/L   Potassium 3.6 3.5 - 5.1 mmol/L   Chloride 103 101 - 111 mmol/L   BUN 23 (H) 6 - 20 mg/dL   Creatinine, Ser 1.50 (H) 0.61 - 1.24 mg/dL   Glucose, Bld 92 65 - 99 mg/dL   Calcium, Ion 1.08 (L) 1.15 - 1.40 mmol/L   TCO2 25 0 - 100 mmol/L   Hemoglobin 16.3 13.0 - 17.0 g/dL   HCT 48.0 39.0 - 52.0 %  Ethanol     Status: None   Collection Time: 05/31/17  1:13 PM  Result Value Ref Range   Alcohol, Ethyl (B) <5 <5 mg/dL    Comment:        LOWEST DETECTABLE LIMIT FOR SERUM ALCOHOL IS 5 mg/dL FOR MEDICAL PURPOSES ONLY   Troponin I-serum (0, 3, 6 hours)     Status: Abnormal   Collection Time: 05/31/17  3:40 PM  Result Value Ref Range   Troponin I 0.21 (HH) <0.03 ng/mL    Comment: CRITICAL RESULT CALLED TO, READ BACK BY AND VERIFIED WITH: H.YOUNT,RN 05/31/17 @ [address]   Lipid panel     Status: None   Collection Time: 05/31/17  3:40 PM  Result Value Ref Range   Cholesterol 155 0 - 200 mg/dL   Triglycerides 82 <150 mg/dL   HDL 48 >40 mg/dL   Total CHOL/HDL Ratio 3.2 RATIO   VLDL 16 0 - 40 mg/dL   LDL Cholesterol 91 0 - 99 mg/dL    Comment:        Total Cholesterol/HDL:CHD Risk Coronary Heart Disease Risk Table                     Men   Women  1/2 Average Risk   3.4   3.3  Average Risk       5.0   4.4  2 X Average Risk   9.6   7.1  3 X Average Risk  23.4   11.0  Use the  calculated Patient Ratio above and the CHD Risk Table to determine the patient's CHD Risk.        ATP III CLASSIFICATION (LDL):  <100     mg/dL   Optimal  100-129  mg/dL   Near or Above                    Optimal  130-159  mg/dL   Borderline  160-189  mg/dL   High  >190     mg/dL   Very High   Troponin I-serum (0, 3, 6 hours)     Status: Abnormal   Collection Time: 05/31/17  5:27 PM  Result Value Ref Range   Troponin I 0.21 (HH) <0.03 ng/mL    Comment: CRITICAL VALUE NOTED.  VALUE IS CONSISTENT WITH PREVIOUSLY REPORTED AND CALLED VALUE.  Troponin I-serum (0, 3, 6 hours)     Status: Abnormal   Collection Time: 05/31/17  8:11 PM  Result Value Ref Range   Troponin I 0.21 (HH) <0.03 ng/mL    Comment: CRITICAL VALUE NOTED.  VALUE IS CONSISTENT WITH PREVIOUSLY REPORTED AND CALLED VALUE.  Comprehensive metabolic panel     Status: Abnormal   Collection Time: 06/01/17  4:51 AM  Result Value Ref Range   Sodium 141 135 - 145 mmol/L   Potassium 4.1 3.5 - 5.1 mmol/L   Chloride 111 101 - 111 mmol/L   CO2 23 22 - 32 mmol/L   Glucose, Bld 79 65 - 99 mg/dL   BUN 19 6 - 20 mg/dL   Creatinine, Ser 1.81 (H) 0.61 - 1.24 mg/dL   Calcium 8.1 (L) 8.9 - 10.3 mg/dL   Total Protein 5.5 (L) 6.5 - 8.1 g/dL   Albumin 2.7 (L) 3.5 - 5.0 g/dL   AST 15 15 - 41 U/L   ALT 6 (L) 17 - 63 U/L   Alkaline Phosphatase 79 38 - 126 U/L   Total Bilirubin 1.0 0.3 - 1.2 mg/dL   GFR calc non Af Amer 32 (L) >60 mL/min   GFR calc Af Amer 37 (L) >60 mL/min    Comment: (NOTE) The eGFR has been calculated using the CKD EPI equation. This calculation has not been validated in all clinical situations. eGFR's persistently <60 mL/min signify possible Chronic Kidney Disease.    Anion gap 7 5 - 15  CBC     Status: Abnormal   Collection Time: 06/01/17  4:51 AM  Result Value Ref Range   WBC 7.6 4.0 - 10.5 K/uL   RBC 4.07 (L) 4.22 - 5.81 MIL/uL   Hemoglobin 12.7 (L) 13.0 - 17.0 g/dL    Comment: DELTA CHECK NOTED REPEATED  TO VERIFY    HCT 38.9 (L) 39.0 - 52.0 %   MCV 95.6 78.0 - 100.0 fL   MCH 31.2 26.0 - 34.0 pg   MCHC 32.6 30.0 - 36.0 g/dL   RDW 14.8 11.5 - 15.5 %   Platelets 100 (L) 150 - 400 K/uL    Comment: REPEATED TO VERIFY SPECIMEN CHECKED FOR CLOTS PLATELET COUNT CONFIRMED BY SMEAR      Lipid Panel     Component Value Date/Time   CHOL 155 05/31/2017 1540   TRIG 82 05/31/2017 1540   HDL 48 05/31/2017 1540   CHOLHDL 3.2 05/31/2017 1540   VLDL 16 05/31/2017 1540   LDLCALC 91 05/31/2017 1540     Lab Results  Component Value Date   HGBA1C 6.1 (H) 07/05/2011   HGBA1C 5.9 (  H) 06/18/2011   HGBA1C  05/03/2011    5.6 (NOTE)                                                                       According to the ADA Clinical Practice Recommendations for 2011, when HbA1c is used as a screening test:   >=6.5%   Diagnostic of Diabetes Mellitus           (if abnormal result  is confirmed)  5.7-6.4%   Increased risk of developing Diabetes Mellitus  References:Diagnosis and Classification of Diabetes Mellitus,Diabetes RCVE,9381,01(BPZWC 1):S62-S69 and Standards of Medical Care in         Diabetes - 2011,Diabetes Care,2011,34  (Suppl 1):S11-S61.       HPI :  83 y.o.malehx of CAD s/p CABG (2000), CKD III, chronic systolic heart failure, PAD, prior stroke, COPD, hypothyroidism, symptomatic bradycardia and syncope s/p PPM, tobacco abuse, thoracic and infrarenal abdominal aortic aneurysm who presented with chest pain ,swimmy headed sensation persistingsince this past Wednesday, and chronically elevated troponin.He presented to the ER for left posterior shoulder pain (CP equivalent per ER physician), dizziness and swimmy headed sensation persisting since this past Wednesday. Per his daughter, he often gets chest pains on and off but starting this morning around 10 am he developed  pain "deep" under his shoulder blade that has been constant and associated with SOB, nausea and diaphoresis. Chest xray with  increased interstitial markings bilaterally concerning for low-grade CHF for early pneumonia, no acute changes. Echocardiogram from April (2017) EF 35-40%, increased right ventricular systolic pressure consistent with moderate pulmonary hypertension.  HOSPITAL COURSE:   Dizziness:  History of CVA,but no focal deficits noted  , CT of the head negative . MR could not be performed due to pacemaker. EKG without acute changes TN 0.17  Telemetry shows normal sinus rhythm PT OT eval-  No follow up PT recommendations, and pt has all necessary DME 2-D echo completed ,results pending prior to dc slightly orthostatic -recommended compression stockings ,    Chest pain syndrome /CAD s/p CABG 2010, stents last cath 2015 Troponin 0.17,EKG without evidence of ACS; paced rythm. Patient seen by cardiology Heparin drip discontinued Multidrug therapy to control anginal symptoms recommended, continue metoprolol,imdur, Norvasc, Held   ACE inhibitor due to increase in renal fn, orthostasis Appreciate cardiology input   Left lower rib pain, no fractures seen in X ray. Non reproducible.  UA negative, except for microscopic hematuria KUB shows constipation    Hypertension  Controlled.Continue med's as above    Chronic kidney disease stage III baseline creatinine 1.5-1.7 at baseline, currently 1.8,holding ACE inhibitor due to mild ortho stasis and cr being slightly above baseline    Hyperlipidemia Continue home statins   Chronic combined systolic and diastolic CHFCXR without acute changes last 2D echo EF 35 to 40%, moderately reduce systolic function. Appears complensated, weight 149 lbs  Lasix prn  Appears compensated   CXR findingsWith low-grade CHF or early interstitial pneumonia. There is no evidence of alveolar pneumonia. WBC norm al. Afebrile.     BPH, continue Flomax   Insomnia,continue Remeron    Discharge Exam:  Blood pressure 136/64, pulse 78, temperature 98.2  F (36.8 C), temperature source Oral, resp. rate 16, height 5'  9" (1.753 m), weight 64.9 kg (143 lb 1.6 oz), SpO2 96 %.  Lungs:  Resp regular and unlabored, CTA. Heart: RRR, S1, S2, no S3, S4, or murmur; no rub. Abdomen: Soft, non-tender, non-distended with normoactive bowel sounds. No hepatomegaly. No rebound/guarding. No obvious abdominal masses. Extremities: No clubbing, cyanosis, edema. Distal pedal pulses are 2+ bilaterally. Neuro: Alert and oriented X 3. Moves all extremities spontaneously.      SignedReyne Dumas 06/02/2017, 1:01 PM        Time spent >1 hour

## 2017-06-02 NOTE — Progress Notes (Signed)
Notified daughter Maxine GlennMonica of patient being discharged after completion of ECHO. ECHO is being performed at bedside as this note is written. Daughter will pick patient up in 1 hour to 1 1/2

## 2017-06-27 ENCOUNTER — Encounter: Payer: Self-pay | Admitting: Family

## 2017-07-05 ENCOUNTER — Encounter: Payer: Self-pay | Admitting: Family

## 2017-07-05 ENCOUNTER — Ambulatory Visit (HOSPITAL_COMMUNITY)
Admission: RE | Admit: 2017-07-05 | Discharge: 2017-07-05 | Disposition: A | Discharge status: Home / Self Care | Payer: Medicare Other | Source: Ambulatory Visit | Attending: Vascular Surgery | Admitting: Vascular Surgery

## 2017-07-05 ENCOUNTER — Ambulatory Visit (INDEPENDENT_AMBULATORY_CARE_PROVIDER_SITE_OTHER): Payer: Medicare Other | Admitting: Family

## 2017-07-05 VITALS — BP 177/89 | HR 80 | Temp 98.1°F | Resp 20 | Ht 69.0 in | Wt 139.0 lb

## 2017-07-05 DIAGNOSIS — I779 Disorder of arteries and arterioles, unspecified: Secondary | ICD-10-CM | POA: Insufficient documentation

## 2017-07-05 DIAGNOSIS — R9439 Abnormal result of other cardiovascular function study: Secondary | ICD-10-CM | POA: Diagnosis not present

## 2017-07-05 DIAGNOSIS — F17200 Nicotine dependence, unspecified, uncomplicated: Secondary | ICD-10-CM

## 2017-07-05 DIAGNOSIS — Z9889 Other specified postprocedural states: Secondary | ICD-10-CM | POA: Diagnosis not present

## 2017-07-05 NOTE — Patient Instructions (Signed)
Steps to Quit Smoking Smoking tobacco can be bad for your health. It can also affect almost every organ in your body. Smoking puts you and people around you at risk for many serious long-lasting (chronic) diseases. Quitting smoking is hard, but it is one of the best things that you can do for your health. It is never too late to quit. What are the benefits of quitting smoking? When you quit smoking, you lower your risk for getting serious diseases and conditions. They can include:  Lung cancer or lung disease.  Heart disease.  Stroke.  Heart attack.  Not being able to have children (infertility).  Weak bones (osteoporosis) and broken bones (fractures).  If you have coughing, wheezing, and shortness of breath, those symptoms may get better when you quit. You may also get sick less often. If you are pregnant, quitting smoking can help to lower your chances of having a baby of low birth weight. What can I do to help me quit smoking? Talk with your doctor about what can help you quit smoking. Some things you can do (strategies) include:  Quitting smoking totally, instead of slowly cutting back how much you smoke over a period of time.  Going to in-person counseling. You are more likely to quit if you go to many counseling sessions.  Using resources and support systems, such as: ? Online chats with a counselor. ? Phone quitlines. ? Printed self-help materials. ? Support groups or group counseling. ? Text messaging programs. ? Mobile phone apps or applications.  Taking medicines. Some of these medicines may have nicotine in them. If you are pregnant or breastfeeding, do not take any medicines to quit smoking unless your doctor says it is okay. Talk with your doctor about counseling or other things that can help you.  Talk with your doctor about using more than one strategy at the same time, such as taking medicines while you are also going to in-person counseling. This can help make  quitting easier. What things can I do to make it easier to quit? Quitting smoking might feel very hard at first, but there is a lot that you can do to make it easier. Take these steps:  Talk to your family and friends. Ask them to support and encourage you.  Call phone quitlines, reach out to support groups, or work with a counselor.  Ask people who smoke to not smoke around you.  Avoid places that make you want (trigger) to smoke, such as: ? Bars. ? Parties. ? Smoke-break areas at work.  Spend time with people who do not smoke.  Lower the stress in your life. Stress can make you want to smoke. Try these things to help your stress: ? Getting regular exercise. ? Deep-breathing exercises. ? Yoga. ? Meditating. ? Doing a body scan. To do this, close your eyes, focus on one area of your body at a time from head to toe, and notice which parts of your body are tense. Try to relax the muscles in those areas.  Download or buy apps on your mobile phone or tablet that can help you stick to your quit plan. There are many free apps, such as QuitGuide from the CDC (Centers for Disease Control and Prevention). You can find more support from smokefree.gov and other websites.  This information is not intended to replace advice given to you by your health care provider. Make sure you discuss any questions you have with your health care provider. Document Released: 09/18/2009 Document   Revised: 07/20/2016 Document Reviewed: 04/08/2015 Elsevier Interactive Patient Education  2018 Elsevier Inc.     Peripheral Vascular Disease Peripheral vascular disease (PVD) is a disease of the blood vessels that are not part of your heart and brain. A simple term for PVD is poor circulation. In most cases, PVD narrows the blood vessels that carry blood from your heart to the rest of your body. This can result in a decreased supply of blood to your arms, legs, and internal organs, like your stomach or kidneys.  However, it most often affects a person's lower legs and feet. There are two types of PVD.  Organic PVD. This is the more common type. It is caused by damage to the structure of blood vessels.  Functional PVD. This is caused by conditions that make blood vessels contract and tighten (spasm).  Without treatment, PVD tends to get worse over time. PVD can also lead to acute ischemic limb. This is when an arm or limb suddenly has trouble getting enough blood. This is a medical emergency. Follow these instructions at home:  Take medicines only as told by your doctor.  Do not use any tobacco products, including cigarettes, chewing tobacco, or electronic cigarettes. If you need help quitting, ask your doctor.  Lose weight if you are overweight, and maintain a healthy weight as told by your doctor.  Eat a diet that is low in fat and cholesterol. If you need help, ask your doctor.  Exercise regularly. Ask your doctor for some good activities for you.  Take good care of your feet. ? Wear comfortable shoes that fit well. ? Check your feet often for any cuts or sores. Contact a doctor if:  You have cramps in your legs while walking.  You have leg pain when you are at rest.  You have coldness in a leg or foot.  Your skin changes.  You are unable to get or have an erection (erectile dysfunction).  You have cuts or sores on your feet that are not healing. Get help right away if:  Your arm or leg turns cold and blue.  Your arms or legs become red, warm, swollen, painful, or numb.  You have chest pain or trouble breathing.  You suddenly have weakness in your face, arm, or leg.  You become very confused or you cannot speak.  You suddenly have a very bad headache.  You suddenly cannot see. This information is not intended to replace advice given to you by your health care provider. Make sure you discuss any questions you have with your health care provider. Document Released:  02/16/2010 Document Revised: 04/29/2016 Document Reviewed: 05/02/2014 Elsevier Interactive Patient Education  2017 Elsevier Inc.  

## 2017-07-05 NOTE — Progress Notes (Signed)
[facility] [place]   CC: Follow up peripheral artery occlusive disease  History of Present Illness [name] is a 83 y.o. male patient of Dr. [name] who is s/pright external common and profundus endarterectomy for critical limb ischemia of his right foot. Surgery was in July 2016. He had coronary intervention. During his evaluation he had a CT scan which showed increasing size of a known thoracic aneurysm. Maximal diameter was approximately 5.6 cm. Also reviewed CT scan for of evaluation of flank pain in February 2017. This revealed a 3 cm iliac artery aneurysms bilaterally. He is quite active. He reports that he just disassembled a engine from a 1950 mercury and is going to reassemble it is getting a running. His wife of 59 years died in 04/24/13. He does have children in the area.  Dr. [name] last saw pt in April 2017 for discussion of results of CTA chest. At that time did review the actual films from CT of his chest and abdomen and pelvis. Pt would potentially be a candidate for stent graft repair of his thoracic aorta although it does extend over the entire thoracic aorta. Also has moderate size iliac artery aneurysms. Has bilateral pleural effusions as well. At that visit Dr. [name] discussed options at length with the patient. Pt is completely asymptomatic from his aneurysms. Explained significant risk with surgery to correct this and feels that in all likelihood his risk for surgery outweighs his risk for rupture. Explained that if this did rupture even is either his iliac or thoracic aneurysm that would be fatal. Explain the potential risk for paralysis with a thoracic stent graft repair. He is not interested in pursuing surgery. Is quite active and independent currently and wished to stay that way. I certainly agree with this decision. He will see [place] again on an as-needed basis re this.  Pt returns today for continued surveillance of his lower extremity arterial  perfusion.  Pt states he had to have a new pacemaker inserted in 04-24-16.  He denies claudication symptoms in his legs with walking, denies non healing wounds. 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  His toes feel cold at night sometimes, he is active all day. Pt states his blood pressure at home is 130-140/65-70 since he stopped taking the medications below.   Pt Diabetic: no Pt smoker: smoker  (1 pp/week started at age 25 yrs)  Pt meds include: Statin : No, he stopped taking, states it might have made him nauseated Betablocker: no, stopped taking  ASA: no, stopped taking Other anticoagulants/antiplatelets: Plavix    Past Medical History:  Diagnosis Date  . AAA (abdominal aortic aneurysm) (HCC)    Has a known AAA of 3.2 x 3.4 cm by abdominal untrasound 03/27/12. Followup abdominal ultrasound on 03/29/13 showed a diameter of 3.6 cm, which is stable  . Abdominal bruit    Loud  . CAD (coronary artery disease)    a. s/p MI in 1987/04/25;  b. s/p CABG in 1999/04/25 (Dr. Barry Dienes) x 4, LIMA to LAD, SVG to diagonal, SVG to [facility], SVG to PDA;  c. 12/2013 NSTEMI/abnl MV;  d. 01/2014 Cath/PCI: native 3VD, VG->RCA 100, VG->OM aneurysmal 40ost/m, 22m(5.0x12 Veriflex BMS), VG->Diag aneurysmal, LIMA->LAD nl.  . Chronic systolic CHF (congestive heart failure) (HCC)    Hospitalization for CHF from 05/04/13-05/06/13. Treated medically. Had possible pneumonia with short course of antibiotics.  Marland Kitchen COPD (chronic obstructive pulmonary disease) (HCC)   . CVA (cerebral infarction) 07/2011   Remote left brain  infarct with sensory deficits and had a left internal carotid stent placed by Dr. [name] at that time, no recurrent CVA or TIAs or CNS events.  . Headache   . Hyperlipidemia   . Hypertension   . Hypothyroid    On supplement "don't know if I take RX or not for my thyroid" (01/16/2014)  . Orthostatic hypotension   . PAD (peripheral artery disease) (HCC)    Stable. Had a past RFPBG by Dr. [name] in 2009. this graft is occluded with  reconstitution in the popliteal artery, which was patent. Right tibial was occluded and 2-vessel runoff on angiography done by [name] in 12/2010.  Marland Kitchen Peripheral vascular disease (HCC)   . Pneumonia   . Presence of permanent cardiac pacemaker   . Sick sinus syndrome (HCC)    PPM implanted for this and syncope   . Stroke (HCC)   . Syncope    tilt table test/8.10.01/orthostatic hypotension  . TIA (transient ischemic attack)     Social History Social History  Substance Use Topics  . Smoking status: Current Every Day Smoker    Packs/day: 0.50    Years: 68.00    Types: Cigarettes    Last attempt to quit: 05/02/2015  . Smokeless tobacco: Never Used  . Alcohol use No    Family History Family History  Problem Relation Age of Onset  . Cancer Brother 60       oral   . Prostate cancer Brother   . Hypertension Brother   . Hyperlipidemia Brother   . Lung disease Father        black lung disease    Past Surgical History:  Procedure Laterality Date  . ABDOMINAL AORTAGRAM N/A 02/22/2012   Procedure: ABDOMINAL Ronny Flurry;  Surgeon: Nada Libman, MD;  Location: [facility] CATH LAB;  Service: Cardiovascular;  Laterality: N/A;  . abdominal aortagram    . CARDIAC CATHETERIZATION  2000  . CAROTID ANGIOGRAM Bilateral 02/22/2012   Procedure: CAROTID ANGIOGRAM;  Surgeon: Nada Libman, MD;  Location: [facility] CATH LAB;  Service: Cardiovascular;  Laterality: Bilateral;  . CAROTID STENT Left 06/23/11   By [name]  . CATARACT EXTRACTION W/ INTRAOCULAR LENS  IMPLANT, BILATERAL Bilateral   . COLONOSCOPY    . CORONARY ANGIOPLASTY WITH STENT PLACEMENT  2001-01/16/2014   "think today makes #4" (01/16/2014)  . CORONARY ARTERY BYPASS GRAFT  2000   1st in 2000 (Dr. Barry Dienes) x 4, LIMA to LAD, SVG to diagonal, SVG to OM4, SVG to PDA. Re-cath in May 2012 with patent LIMA to LAD, patent SVG to diagonal, patent SVG to OM with left-to-right collaterals to an occluded right.  Marland Kitchen ENDARTERECTOMY FEMORAL Right 06/25/2015    Procedure: ENDARTERECTOMY RIGHT FEMORAL ARTERY ;  Surgeon: Larina Earthly, MD;  Location: [facility] OR;  Service: Vascular;  Laterality: Right;  . FEMORAL BYPASS Right ?2001  . INGUINAL HERNIA REPAIR Right 1980's  . INSERT / REPLACE / REMOVE PACEMAKER     implanted 2003 by Dr Jenne Campus with gen change (MDT ADDRL1) 08/2010 by [facility]  . LEFT HEART CATHETERIZATION WITH CORONARY/GRAFT ANGIOGRAM N/A 01/16/2014   Procedure: LEFT HEART CATHETERIZATION WITH Isabel Caprice;  Surgeon: Kathleene Hazel, MD;  Location: [facility] CATH LAB;  Service: Cardiovascular;  Laterality: N/A;  . PATCH ANGIOPLASTY Right 06/25/2015   Procedure: RIGHT COMMON FEMORAL ARTERY AND PROFUNDA PATCH ANGIOPLASTY USING HEMASHIELD PLATINUM FINESSE PATCH ;  Surgeon: Larina Earthly, MD;  Location: [facility] OR;  Service: Vascular;  Laterality: Right;  .  PERIPHERAL VASCULAR CATHETERIZATION N/A 06/24/2015   Procedure: Abdominal Aortogram;  Surgeon: Nada Libman, MD;  Location: [facility] INVASIVE CV LAB;  Service: Cardiovascular;  Laterality: N/A;    Allergies  Allergen Reactions  . Tramadol Nausea And Vomiting    Other reaction(s): Other (See Comments) unknown  . Pravachol Other (See Comments)    myalgias  . Pravastatin Other (See Comments)    myalgias  . Simvastatin Other (See Comments)    myalgia  . Other Other (See Comments)    Other reaction(s): Other (See Comments) GREEN BEANS AND ONIONS GREEN BEANS AND ONIONS  . Oxycodone Other (See Comments)    Other reaction(s): Other (See Comments) Family member reports "he was highly agitated and disoriented" unknown Family member reports "he was highly agitated and disoriented"   . Terazosin Other (See Comments)    Other reaction(s): Other (See Comments) unknown unknown    Current Outpatient Prescriptions  Medication Sig Dispense Refill  . amLODipine (NORVASC) 10 MG tablet Take 10 mg by mouth daily.    Marland Kitchen aspirin 81 MG tablet Take 81 mg by mouth daily.    Marland Kitchen atorvastatin (LIPITOR) 40 MG  tablet Take 1 tablet (40 mg total) by mouth daily at 6 PM. 30 tablet 5  . isosorbide mononitrate (IMDUR) 60 MG 24 hr tablet Take 90 mg by mouth daily.    . meclizine (ANTIVERT) 12.5 MG tablet Take 1 tablet (12.5 mg total) by mouth 3 (three) times daily as needed for dizziness. 30 tablet 0  . metoprolol succinate (TOPROL-XL) 50 MG 24 hr tablet Take 1 tablet (50 mg total) by mouth daily. Take with or immediately following a meal. 30 tablet 1  . potassium chloride (K-DUR) 10 MEQ tablet Take 1 tablet (10 mEq total) by mouth daily. 90 tablet 3  . Tamsulosin HCl (FLOMAX) 0.4 MG CAPS Take 0.4 mg by mouth daily after supper.     Marland Kitchen acetaminophen (TYLENOL) 325 MG tablet Take 650 mg by mouth every 6 (six) hours as needed for mild pain. Reported on 11/25/2015    . HYDROcodone-acetaminophen (NORCO/VICODIN) 5-325 MG tablet Take 1-2 tablets by mouth every 6 (six) hours as needed for moderate pain. (Patient not taking: Reported on 07/05/2017) 14 tablet 0  . methocarbamol (ROBAXIN) 500 MG tablet Take 1 tablet (500 mg total) by mouth every 8 (eight) hours as needed for muscle spasms. (Patient not taking: Reported on 07/05/2017) 30 tablet 1  . mirtazapine (REMERON) 15 MG tablet Take 15 mg by mouth at bedtime.    . nitroGLYCERIN (NITROSTAT) 0.4 MG SL tablet Place 1 tablet (0.4 mg total) under the tongue every 5 (five) minutes as needed. For chest pain (Patient not taking: Reported on 07/05/2017) 25 tablet 3  . ondansetron (ZOFRAN ODT) 4 MG disintegrating tablet Take 1 tablet (4 mg total) by mouth every 8 (eight) hours as needed for nausea or vomiting. (Patient not taking: Reported on 07/05/2017) 12 tablet 1   No current facility-administered medications for this visit.     ROS: See HPI for pertinent positives and negatives.   Physical Examination  Vitals:   07/05/17 1326 07/05/17 1327  BP: (!) 168/89 (!) 177/89  Pulse: 80   Resp: 20   Temp: 98.1 F (36.7 C)   TempSrc: Oral   SpO2: 95%   Weight: 139 lb (63 kg)    Height: 5\' 9"  (1.753 m)    Body mass index is 20.53 kg/m.  General: A&O x 3, WDWN, thin, fit appearing male. Gait: normal  Eyes: PERRLA, bilateral arcus senilus. Pulmonary: Respirations are non labored, CTAB but limited air movement, without wheezes, rales, or rhonchi. Cardiac: regular rhythm and rate, no detected murmur.         Carotid Bruits Right Left   Negative Negative  Aorta is palpable. Radial pulses: 2+ palpable and =                           VASCULAR EXAM: Extremities without ischemic changes,   no Gangrene; no open wounds.                                                                                                                                                       LE Pulses Right Left       FEMORAL  2+ palpable  3+ palpable        POPLITEAL  not palpable   not palpable       POSTERIOR TIBIAL  not palpable   not palpable        DORSALIS PEDIS      ANTERIOR TIBIAL not palpable  not palpable    Abdomen: soft, NT, no palpable masses. Skin: no rashes, no ulcers Musculoskeletal: no muscle wasting or atrophy.         Neurologic: A&O X 3; Appropriate Affect ; SENSATION: normal; MOTOR FUNCTION:  moving all extremities equally, motor strength 5/5 throughout. Speech is fluent/normal. CN 2-12 intact.     ASSESSMENT: [name] is a 83 y.o. male who is s/pright external common and profundus endarterectomy in July 2016 for critical limb ischemia of his right foot.  He walks a great deal with no claudication symptoms. There are no signs of ischemia in his feet or legs.   Fortunately he does not have DM, but unfortunately he continues to smoke, see Plan.  I advised pt to notify his PCP today re his 1 week hx of dysuria and which medications he stopped taking.    DATA  ABI (Date: 07/05/2017)  R:   ABI: 0.59 (was 0.71 on 06-29-16),   PT: not documented (was mono)  DP: not documented (was mono)  TBI:  0.33 (was 0.47)  L:   ABI: 0.62  (was 0.68),   PT: not documented (was mono)  DP: not documented (was mono)  TBI: 0.39 (was 0.45) ABI's declined slightly bilaterally: moderate bilateral arterial occlusive disease. TBI's declined slightly bilaterally.   PLAN:  Based on the patient's vascular studies and examination, and after discussing with Dr. Arbie CookeyEarly, pt will return to clinic in 1 year with ABI's.  Continue walking a great deal. The patient was counseled re smoking cessation and given several free resources re smoking cessation.  I discussed in depth with the patient the nature of atherosclerosis, and emphasized the importance of maximal medical  management including strict control of blood pressure, blood glucose, and lipid levels, obtaining regular exercise, and cessation of smoking.  The patient is aware that without maximal medical management the underlying atherosclerotic disease process will progress, limiting the benefit of any interventions.  The patient was given information about PAD including signs, symptoms, treatment, what symptoms should prompt the patient to seek immediate medical care, and risk reduction measures to take.  Charisse MarchSuzanne Tranquilino Fischler, RN, MSN, FNP-C [facility] [place] Office Phone: [phone]  Clinic MD: Early  07/05/17 1:46 PM

## 2017-07-20 ENCOUNTER — Telehealth: Payer: Self-pay | Admitting: Cardiology

## 2017-07-20 NOTE — Telephone Encounter (Signed)
New Message     They need to clarify what medication this patient is suppose to be taken, he stopped his plavix and is confused on what he is suppose to be on

## 2017-07-20 NOTE — Telephone Encounter (Signed)
Spoke with the pt and informed him that Dr Rico SheehanNelson okayed for him to take his current regimen as is.  Scheduled the pt an appt with Dr Delton SeeNelson for 10/15 at 1140, for follow-up, and being overdue for his yearly appt.  Pt verbalized understanding and agrees with this plan.

## 2017-07-20 NOTE — Telephone Encounter (Signed)
Pts PCP is calling to report that she saw the pt for follow-up for recent UTI, and she was going over all his meds with him while at that visit. Per Lelon MastSamantha, a Provider at [facility], while going over the pts meds with him, he reported many changes to his current med list, and it appears the changes were not advised by the original order Provider in which they came from.  BELOW ARE THE MEDS THE PT REPORTED HE'S NOT TAKING AND TAKING, PER SAMANTHA :    The pt is NOT TAKING, as advised by Dr Delton SeeNelson:  1. Plavix 2.  Lasix 3.  Hydralazine   Pt IS TAKING the meds listed below:  1.  Toprol XL 50 mg po daily 2.  Lipitor 40 mg po daily 3.  ASA 81 mg po daily 4.  Amlodipine 10 mg po daily 5.  Pt INCREASED Imdur from 60 mg to 90 mg po daily (Dr Delton SeeNelson you ordered him to decrease his Imdur to 60 mg po daily at last OV d/t hypotension).    Per Lelon MastSamantha, NP with [facility], the pt is being non-compliant with taking his hydralazine, because he doesn't want to take this med TID.  Per Lelon MastSamantha, the pt endorsed to her that he likes to take ALL his meds at one time, at bedtime.  Per Lelon MastSamantha, she wanted to make Dr Delton SeeNelson aware of what the pt is taking and not taking, and have her advise if he needs to go back on Plavix and Lasix, and could the Hydralazine be eliminated, for the pt will NOT take this because he has to take this multiple times a day.  Lelon MastSamantha reports that the pts BP while in the office was stable at 130/80.  Informed Lelon MastSamantha that I will route this message to Dr Delton SeeNelson to review and advise on pts medication regimen. Will also inform Dr. Delton SeeNelson that I will make contact with the pt, to make him an appt to come into see an Extender, Vin PA-C on 8/23, for follow-up with the pt and compliance with cardiac meds.  Samantha verbalized understanding and agrees with this plan.  Lelon MastSamantha states she will be contacting all Providers involved with the pts care, to inform them of what meds pt is taking  and not taking.

## 2017-07-20 NOTE — Telephone Encounter (Signed)
His regimen seems to be working for him, I would continue what he is taking Please send my greetings to him

## 2017-08-18 ENCOUNTER — Other Ambulatory Visit: Payer: Self-pay | Admitting: Physician Assistant

## 2017-08-18 ENCOUNTER — Ambulatory Visit
Admission: RE | Admit: 2017-08-18 | Discharge: 2017-08-18 | Disposition: A | Discharge status: Home / Self Care | Payer: Medicare Other | Source: Ambulatory Visit | Attending: Physician Assistant | Admitting: Physician Assistant

## 2017-08-18 DIAGNOSIS — M7989 Other specified soft tissue disorders: Secondary | ICD-10-CM

## 2017-09-08 ENCOUNTER — Encounter: Payer: Self-pay | Admitting: *Deleted

## 2017-09-19 ENCOUNTER — Ambulatory Visit (INDEPENDENT_AMBULATORY_CARE_PROVIDER_SITE_OTHER): Payer: Medicare Other | Admitting: Cardiology

## 2017-09-19 ENCOUNTER — Encounter: Payer: Self-pay | Admitting: Cardiology

## 2017-09-19 ENCOUNTER — Encounter (INDEPENDENT_AMBULATORY_CARE_PROVIDER_SITE_OTHER): Payer: Self-pay

## 2017-09-19 VITALS — BP 136/72 | HR 82 | Ht 69.0 in | Wt 138.0 lb

## 2017-09-19 DIAGNOSIS — F17200 Nicotine dependence, unspecified, uncomplicated: Secondary | ICD-10-CM | POA: Diagnosis not present

## 2017-09-19 DIAGNOSIS — I712 Thoracic aortic aneurysm, without rupture, unspecified: Secondary | ICD-10-CM

## 2017-09-19 DIAGNOSIS — I25119 Atherosclerotic heart disease of native coronary artery with unspecified angina pectoris: Secondary | ICD-10-CM | POA: Diagnosis not present

## 2017-09-19 DIAGNOSIS — R42 Dizziness and giddiness: Secondary | ICD-10-CM

## 2017-09-19 DIAGNOSIS — I2510 Atherosclerotic heart disease of native coronary artery without angina pectoris: Secondary | ICD-10-CM

## 2017-09-19 DIAGNOSIS — I110 Hypertensive heart disease with heart failure: Secondary | ICD-10-CM

## 2017-09-19 DIAGNOSIS — Z950 Presence of cardiac pacemaker: Secondary | ICD-10-CM

## 2017-09-19 DIAGNOSIS — I5033 Acute on chronic diastolic (congestive) heart failure: Secondary | ICD-10-CM | POA: Diagnosis not present

## 2017-09-19 MED ORDER — ISOSORBIDE MONONITRATE ER 30 MG PO TB24: 30.0000 mg | ORAL_TABLET | Freq: Every day | ORAL | 3 refills | Status: DC
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
## 2017-09-19 NOTE — Patient Instructions (Signed)
Medication Instructions:   DECREASE YOUR IMDUR (ISOSORBIDE MONONITRATE) TO 30 MG ONCE DAILY     Follow-Up:  Your physician wants you to follow-up in: 6 MONTHS WITH DR Johnell Comings will receive a reminder letter in the mail two months in advance. If you don't receive a letter, please call our office to schedule the follow-up appointment.        If you need a refill on your cardiac medications before your next appointment, please call your pharmacy.

## 2018-02-02 IMAGING — CT CT CTA ABD/PEL W/CM AND/OR W/O CM
3 of 11 series · 10 of 46 positions shown, 16 images · IV contrast (OMNIPAQUE 300)
Comparison: Noncontrast CT pelvis 07/03/2015. Contrast CT abdomen
pelvis 04/06/2012

CLINICAL DATA: 88-year-old male with left-sided abdominal pain and
nausea. Left leg pain.

EXAM:
CTA ABDOMEN AND PELVIS wITHOUT AND WITH CONTRAST
TECHNIQUE: Multidetector CT imaging of the abdomen and pelvis was performed
using the standard protocol during bolus administration of
intravenous contrast. Multiplanar reconstructed images and MIPs were
obtained and reviewed to evaluate the vascular anatomy.
CONTRAST:  100mL OMNIPAQUE IOHEXOL 350 MG/ML SOLN

[Series 4: arterial 3.0 b30f · axial · arterial · 0.65mm/px · z∈[+1074,+1308]mm · 5 of 156 slices shown]
[im 16/156  soft-tissue]
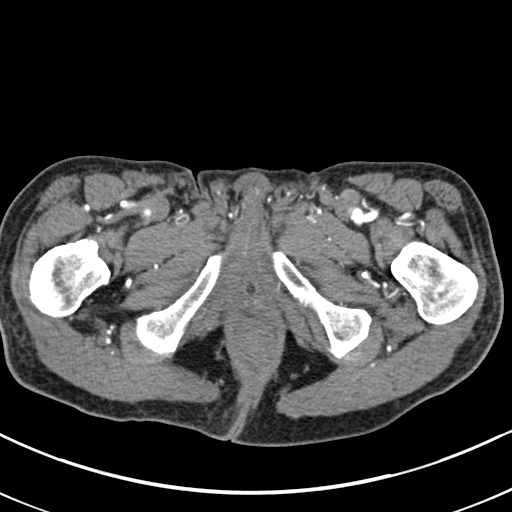
[im 32/156  soft-tissue]
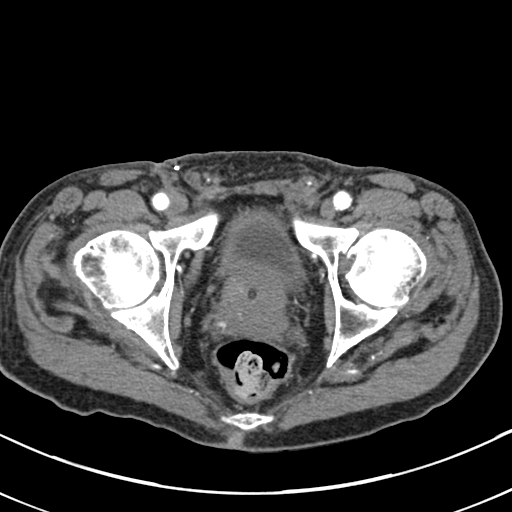
[im 47/156  soft-tissue]
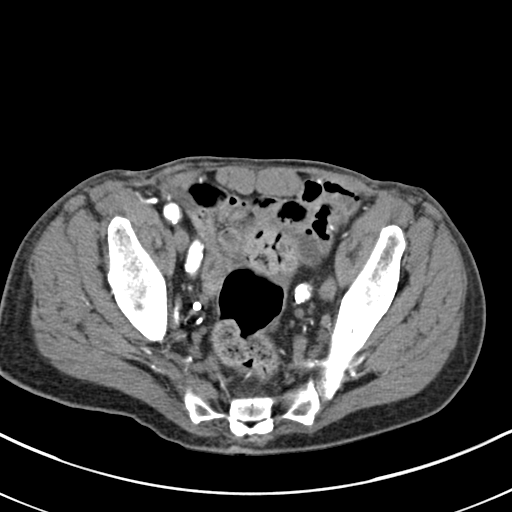
[im 63/156  soft-tissue]
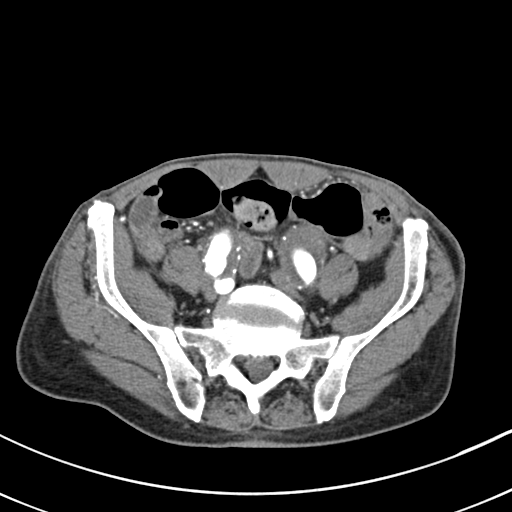
[im 94/156  soft-tissue]
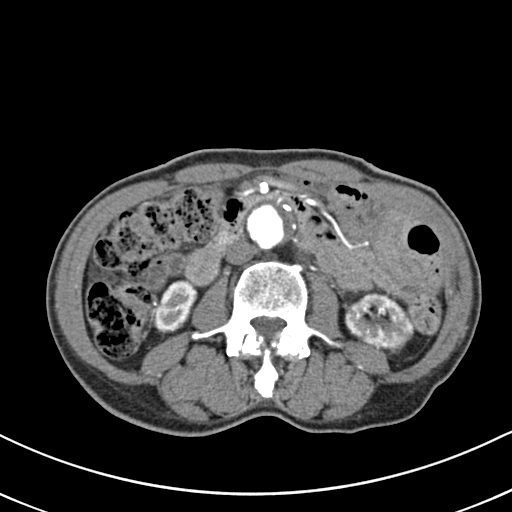

[Series 5: venous 5.0 b30f · axial · portal-venous · 0.65mm/px · z∈[+1134,+1404]mm · 4 of 90 slices shown, 9 images]
[im 18/90  soft-tissue]
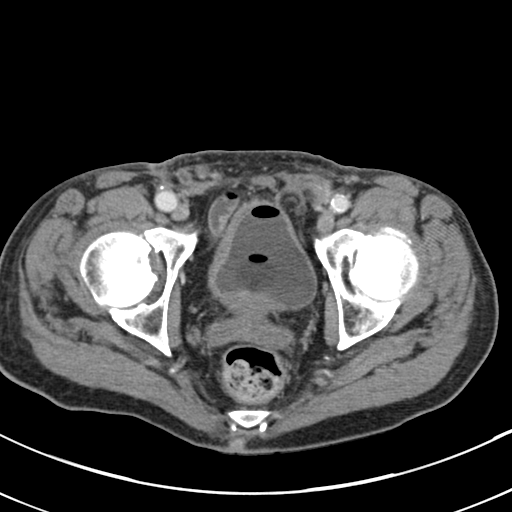
[im 18/90  lung]
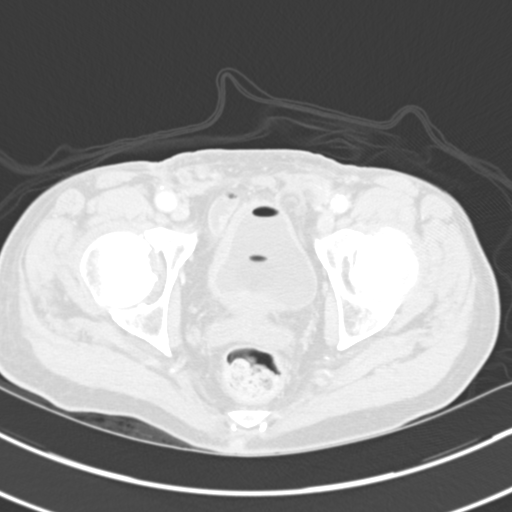
[im 18/90  bone]
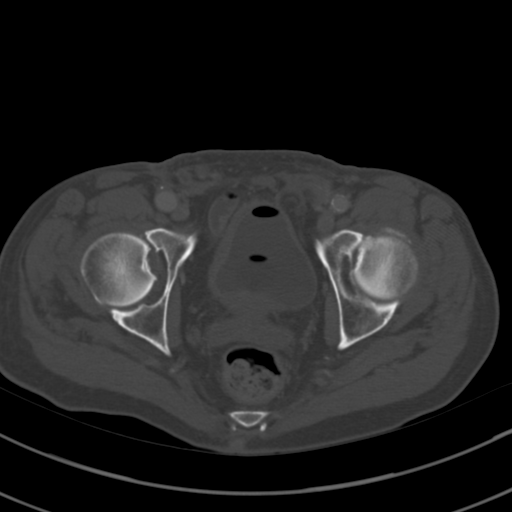
[im 36/90  soft-tissue]
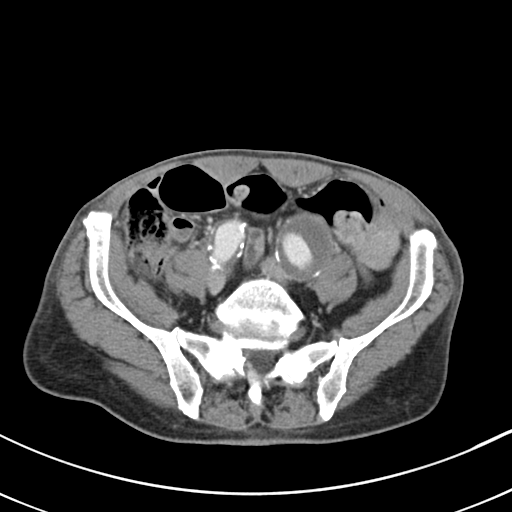
[im 36/90  lung]
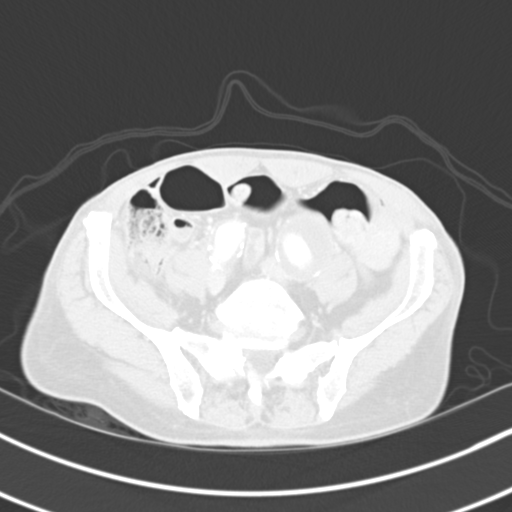
[im 54/90  soft-tissue]
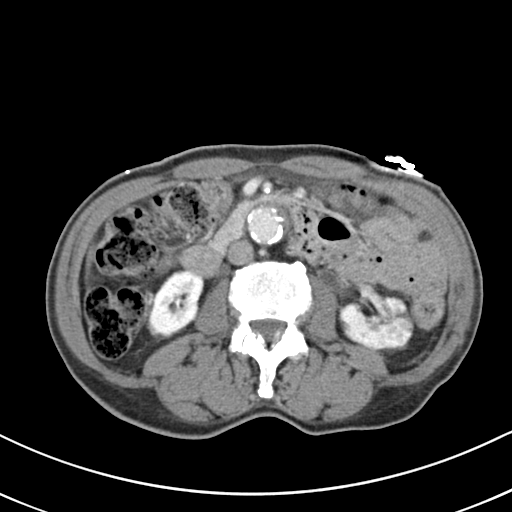
[im 54/90  lung]
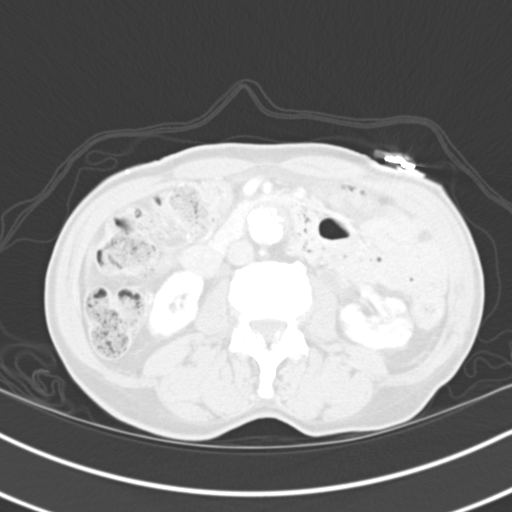
[im 72/90  soft-tissue]
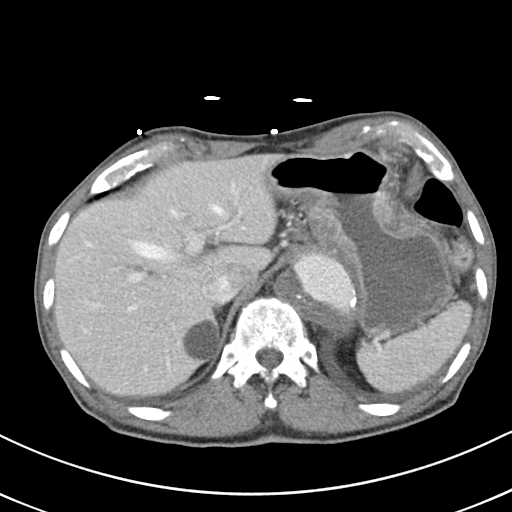
[im 72/90  lung]
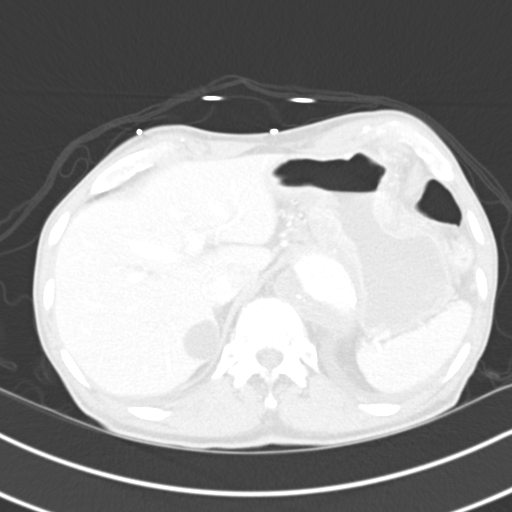

[Series 13: coronal · coronal · 0.58mm/px · 1 of 105 slices shown, 2 images]
[im 53/105  soft-tissue]
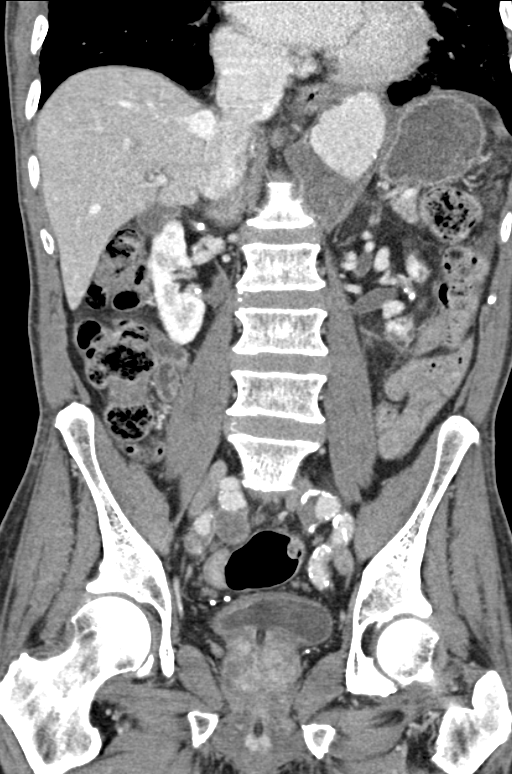
[im 53/105  bone]
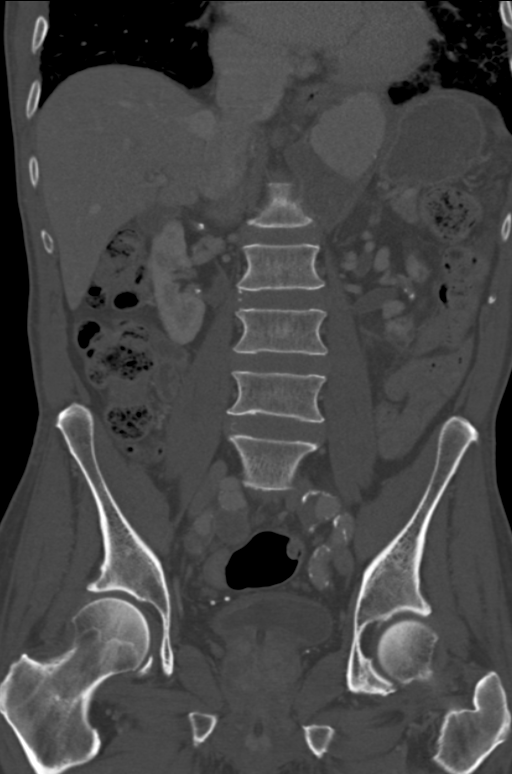

[10 of 46 positions shown; findings below may reference images not displayed]

FINDINGS: Lower chest: Dilatation of the included descending thoracic aorta
appears unchanged from chest CT 05/13/2015, maximal dimension 5 cm.
There is eccentric mural thrombus. The heart is enlarged. Small
bilateral pleural effusions. Subpleural reticular opacities in the
lower lobes, concerning for fibrosis.

Liver: Scattered hepatic cysts.  No suspicious lesion.

Hepatobiliary: Gallbladder physiologically distended, no calcified
stone. No biliary dilatation.

Pancreas: No ductal dilatation or inflammation.

Spleen: Normal.

Adrenal glands: No nodule.

Kidneys: Diffusely heterogeneous appearance of the left kidney,
suggesting pyelonephritis. No hydronephrosis. Small cortical
hypodensity in the right kidney, too small to characterize.

Stomach/Bowel: Suboptimally assessed without contrast. Stomach
physiologically distended with ingested contents. There are no
dilated or thickened small bowel loops. Moderate volume of stool
throughout the colon without colonic wall thickening. Diffuse
colonic tortuosity. The appendix is normal.

Vascular/Lymphatic: Aneurysmal dilatation of the infrarenal
abdominal aorta. Dilatation begins 1.5 cm from the left renal
artery. Maximal aortic dimension of by 4.2 x 4.2 cm progressed from
0846. There is circumferential mural thrombus. Left common iliac
artery aneurysm measures 3.9 cm, 3.6 cm in June 2015. Mural thrombus
extends to the internal external iliac bifurcation, with the
external iliac artery occluded. Aneurysmal dilatation of the right
common iliac artery measures 2.4 cm, unchanged from June 2015 CT.
There is aneurysmal dilatation of the right internal iliac artery
measuring 2.2 cm, previously 2.0 cm. The proximal right common iliac
artery measures 1.6 cm. There is no periaortic soft tissue stranding
to suggest rupture. Stenosis of the origin celiac artery secondary
to atherosclerosis. Stenosis of the proximal superior mesenteric
artery secondary to atherosclerosis. No abrupt occlusion. The IMA is
patent. Two right and single left renal arteries are patent, with
atherosclerosis at the origins. No retroperitoneal adenopathy.

Reproductive: Prostate gland is enlarged.

Bladder: Foley catheter in place.  Bladder wall thickening is seen.

Other: No free air, free fluid, or intra-abdominal fluid collection.

Musculoskeletal: There are no acute or suspicious osseous
abnormalities. Age-related degenerative change in the spine with
unilateral left L5 pars defect, no listhesis.

Review of the MIP images confirms the above findings.
IMPRESSION: 1. No evidence of acute aortic abnormality. Abdominal aortic
aneurysm, current maximal dimension 4.2 cm, increased from 3 cm in
0846. There are aneurysms of the descending thoracic aorta, left
greater than right common iliac arteries, right internal and
external iliac arteries. The descending thoracic aortic aneurysm and
left common iliac artery aneurysm have increased from 0846. There is
diffuse mural thrombus. The recommended follow-up for in abdominal
aortic aneurysm of this size is ultrasound 1 year. This
recommendation follows ACR consensus guidelines: White Paper of the
ACR Incidental Findings Committee II on Vascular Findings. [HOSPITAL] 0846; [DATE].
2. Stenosis at the origin of the celiac and proximal SMA secondary
to atherosclerosis.
3. Heterogeneous enhancement of the left kidney, concerning for
pyelonephritis.
4. Lung base findings concerning for pulmonary fibrosis.
5. Additional findings as described.

## 2018-03-12 ENCOUNTER — Emergency Department (HOSPITAL_COMMUNITY)
Admission: EM | Admit: 2018-03-12 | Discharge: 2018-03-12 | Discharge status: Against Medical Advice | Payer: Medicare Other | Attending: Emergency Medicine | Admitting: Emergency Medicine

## 2018-03-12 ENCOUNTER — Emergency Department (HOSPITAL_COMMUNITY): Payer: Medicare Other

## 2018-03-12 DIAGNOSIS — E039 Hypothyroidism, unspecified: Secondary | ICD-10-CM | POA: Insufficient documentation

## 2018-03-12 DIAGNOSIS — Z950 Presence of cardiac pacemaker: Secondary | ICD-10-CM | POA: Insufficient documentation

## 2018-03-12 DIAGNOSIS — I714 Abdominal aortic aneurysm, without rupture: Secondary | ICD-10-CM | POA: Insufficient documentation

## 2018-03-12 DIAGNOSIS — I2510 Atherosclerotic heart disease of native coronary artery without angina pectoris: Secondary | ICD-10-CM | POA: Diagnosis not present

## 2018-03-12 DIAGNOSIS — R0602 Shortness of breath: Secondary | ICD-10-CM | POA: Insufficient documentation

## 2018-03-12 DIAGNOSIS — E785 Hyperlipidemia, unspecified: Secondary | ICD-10-CM | POA: Diagnosis not present

## 2018-03-12 DIAGNOSIS — J449 Chronic obstructive pulmonary disease, unspecified: Secondary | ICD-10-CM | POA: Insufficient documentation

## 2018-03-12 DIAGNOSIS — Z8673 Personal history of transient ischemic attack (TIA), and cerebral infarction without residual deficits: Secondary | ICD-10-CM | POA: Diagnosis not present

## 2018-03-12 DIAGNOSIS — F17210 Nicotine dependence, cigarettes, uncomplicated: Secondary | ICD-10-CM | POA: Insufficient documentation

## 2018-03-12 DIAGNOSIS — Z79899 Other long term (current) drug therapy: Secondary | ICD-10-CM | POA: Diagnosis not present

## 2018-03-12 DIAGNOSIS — I252 Old myocardial infarction: Secondary | ICD-10-CM | POA: Insufficient documentation

## 2018-03-12 DIAGNOSIS — I10 Essential (primary) hypertension: Secondary | ICD-10-CM | POA: Insufficient documentation

## 2018-03-12 DIAGNOSIS — Z7982 Long term (current) use of aspirin: Secondary | ICD-10-CM | POA: Diagnosis not present

## 2018-03-12 LAB — BASIC METABOLIC PANEL
ANION GAP: 12 (ref 5–15)
BUN: 23 mg/dL — AB (ref 6–20)
CO2: 22 mmol/L (ref 22–32)
Calcium: 8.5 mg/dL — ABNORMAL LOW (ref 8.9–10.3)
Chloride: 110 mmol/L (ref 101–111)
Creatinine, Ser: 2.08 mg/dL — ABNORMAL HIGH (ref 0.61–1.24)
GFR calc Af Amer: 31 mL/min — ABNORMAL LOW (ref >60)
GFR, EST NON AFRICAN AMERICAN: 27 mL/min — AB (ref >60)
GLUCOSE: 91 mg/dL (ref 65–99)
Potassium: 3.4 mmol/L — ABNORMAL LOW (ref 3.5–5.1)
Sodium: 144 mmol/L (ref 135–145)

## 2018-03-12 LAB — CBC
HEMATOCRIT: 41.2 % (ref 39.0–52.0)
HEMOGLOBIN: 13.6 g/dL (ref 13.0–17.0)
MCH: 31.8 pg (ref 26.0–34.0)
MCHC: 33 g/dL (ref 30.0–36.0)
MCV: 96.3 fL (ref 78.0–100.0)
Platelets: 110 10*3/uL — ABNORMAL LOW (ref 150–400)
RBC: 4.28 MIL/uL (ref 4.22–5.81)
RDW: 15.3 % (ref 11.5–15.5)
WBC: 8.5 10*3/uL (ref 4.0–10.5)

## 2018-03-12 LAB — BRAIN NATRIURETIC PEPTIDE: B Natriuretic Peptide: 1407.8 pg/mL — ABNORMAL HIGH (ref 0.0–100.0)

## 2018-03-12 LAB — TROPONIN I: Troponin I: 0.19 ng/mL (ref <0.03)

## 2018-03-12 MED ORDER — ALBUTEROL SULFATE (2.5 MG/3ML) 0.083% IN NEBU
5.0000 mg | INHALATION_SOLUTION | Freq: Once | RESPIRATORY_TRACT | Status: AC
  Administered 2018-03-12: 5 mg via RESPIRATORY_TRACT
  Filled 2018-03-12: qty 6

## 2018-03-12 NOTE — ED Provider Notes (Signed)
[facility] EMERGENCY DEPARTMENT Provider Note   CSN: [id number] Arrival date & time: 03/12/18  0543     History   Chief Complaint Chief Complaint  Patient presents with  . Shortness of Breath    HPI [name] is a 84 y.o. male.  Patient with history of known thoracic and intra-renal AAA, acute coronary syndrome coronary artery disease, chronic heart failure, COPD, history of stroke, pacemaker placement --presents to the emergency department with episode of shortness of breath.  Patient woke from sleep early this morning and complained that he could not breathe.  Patient's sister who lives with him called the ambulance.  EMS arrived to find patient with an oxygen saturation in the low 90s.  He was placed on oxygen by nasal cannula and felt better.  Initially he tried to refuse transport to the hospital but his family made him come.  Currently he is feeling back to his baseline and has no complaints.  He denies chest pain or abdominal pain at any time.  He did not have lightheadedness or diaphoresis.  No nausea or vomiting.  No recent fevers or preceding illness.  He did not try to take any medications.  He is not on chronic oxygen at home. The onset of this condition was acute. The course is constant. Aggravating factors: none. Alleviating factors: none.       Past Medical History:  Diagnosis Date  . AAA (abdominal aortic aneurysm) (HCC)    Has a known AAA of 3.2 x 3.4 cm by abdominal untrasound 03/27/12. Followup abdominal ultrasound on 03/29/13 showed a diameter of 3.6 cm, which is stable  . Abdominal bruit    Loud  . CAD (coronary artery disease)    a. s/p MI in 1988;  b. s/p CABG in 2000 (Dr. Barry Dienes) x 4, LIMA to LAD, SVG to diagonal, SVG to [facility], SVG to PDA;  c. 12/2013 NSTEMI/abnl MV;  d. 01/2014 Cath/PCI: native 3VD, VG->RCA 100, VG->OM aneurysmal 40ost/m, 34m(5.0x12 Veriflex BMS), VG->Diag aneurysmal, LIMA->LAD nl.  . Chronic systolic CHF (congestive heart  failure) (HCC)    Hospitalization for CHF from 05/04/13-05/06/13. Treated medically. Had possible pneumonia with short course of antibiotics.  Marland Kitchen COPD (chronic obstructive pulmonary disease) (HCC)   . CVA (cerebral infarction) 07/2011   Remote left brain infarct with sensory deficits and had a left internal carotid stent placed by Dr. [name] at that time, no recurrent CVA or TIAs or CNS events.  . Headache   . Hyperlipidemia   . Hypertension   . Hypothyroid    On supplement "don't know if I take RX or not for my thyroid" (01/16/2014)  . Orthostatic hypotension   . PAD (peripheral artery disease) (HCC)    Stable. Had a past RFPBG by Dr. [name] in 2009. this graft is occluded with reconstitution in the popliteal artery, which was patent. Right tibial was occluded and 2-vessel runoff on angiography done by [name] in 12/2010.  Marland Kitchen Peripheral vascular disease (HCC)   . Pneumonia   . Presence of permanent cardiac pacemaker   . Sick sinus syndrome (HCC)    PPM implanted for this and syncope   . Stroke (HCC)   . Syncope    tilt table test/8.10.01/orthostatic hypotension  . TIA (transient ischemic attack)     Patient Active Problem List   Diagnosis Date Noted  . Vertigo   . High blood pressure 03/28/2016  . Encounter for follow-up examination after completed treatment for conditions other than malignant  neoplasm 03/28/2016  . Thoracic aortic aneurysm without rupture (HCC) 03/15/2016  . Coronary artery disease due to lipid rich plaque   . Chest pain   . Abdominal aortic aneurysm (AAA) without rupture (HCC)   . Hypertensive emergency   . Acute on chronic systolic CHF (congestive heart failure) (HCC)   . Personal history of other medical treatment 02/11/2016  . Left flank pain, chronic 02/11/2016  . Pyelonephritis 02/11/2016  . AAA (abdominal aortic aneurysm) (HCC) 02/09/2016  . Anxiety 02/09/2016  . Arteriosclerotic cardiovascular disease (ASCVD) 02/09/2016  . Atrial fibrillation (HCC)  02/09/2016  . Benign prostatic hyperplasia 02/09/2016  . Bilateral leg edema 02/09/2016  . CAD of autologous arterial graft 02/09/2016  . Chronic kidney disease 02/09/2016  . Congestive heart failure (CHF) (HCC) 02/09/2016  . Diaphragmatic hernia 02/09/2016  . Hemorrhagic stroke (HCC) 02/09/2016  . Peptic ulcer without hemorrhage or perforation 02/09/2016  . Peripheral arteriosclerosis (HCC) 02/09/2016  . Peripheral vascular disease (HCC) 02/09/2016  . Abdominal pain 01/29/2016  . Nausea 01/29/2016  . UTI (urinary tract infection) 01/29/2016  . Right leg pain 01/29/2016  . AP (abdominal pain)   . Hematuria 07/04/2015  . Faintness   . Post-operative complication 07/03/2015  . Postoperative anemia due to acute blood loss 07/03/2015  . Anemia due to blood loss   . Gross hematuria   . H/O endarterectomy   . BPH (benign prostatic hyperplasia) 06/30/2015  . PAD (peripheral artery disease) (HCC) 06/10/2015  . Elevated troponin 05/13/2015  . Nontraumatic cortical hemorrhage of cerebral hemisphere (HCC) 01/31/2015  . Essential hypertension 01/31/2015  . Orthostatic hypotension 01/31/2015  . HLD (hyperlipidemia) 01/31/2015  . Coronary artery disease involving native coronary artery of native heart with angina pectoris (HCC) 01/31/2015  . PVD (peripheral vascular disease) (HCC) 01/31/2015  . Cardiac pacemaker in situ 01/31/2015  . Presence of cardiac pacemaker 01/31/2015  . Right sided weakness   . Stroke (HCC)   . ICH (intracerebral hemorrhage) (HCC) 11/17/2014  . CAP (community acquired pneumonia) 06/09/2014  . Acute renal failure superimposed on stage 3 chronic kidney disease (HCC) 06/09/2014  . CHF (congestive heart failure) (HCC) 06/09/2014  . Acute-on-chronic renal failure (HCC) 06/09/2014  . Unstable angina (HCC) 01/16/2014  . COPD with acute exacerbation (HCC) 01/04/2014  . Influenza with respiratory manifestations 01/02/2014  . Acute on chronic kidney failure (HCC)  01/02/2014  . Leukocytosis, unspecified 01/02/2014  . Malnutrition of moderate degree (HCC) 01/01/2014  . Acute bronchitis 12/30/2013  . Chronic combined systolic and diastolic CHF (congestive heart failure) (HCC) 12/30/2013  . NSTEMI (non-ST elevated myocardial infarction) (HCC) 12/30/2013  . CAD (coronary artery disease) of artery bypass graft 12/13/2013  . Sick sinus syndrome [facility](HCC) s/p [employer] PPM 12/13/2013  . Acute on chronic combined systolic and diastolic CHF (congestive heart failure) (HCC) 05/04/2013  . HTN (hypertension) 05/04/2013  . Hyperlipidemia 05/04/2013  . Tobacco abuse 05/04/2013  . History of CVA (cerebrovascular accident) 05/04/2013  . Thrombocytopenia (HCC) 05/04/2013  . Acute on chronic combined systolic and diastolic congestive heart failure (HCC) 05/04/2013  . History of stroke 05/04/2013  . Colon polyp 04/19/2012  . Occlusion and stenosis of carotid artery without mention of cerebral infarction 02/07/2012  . Acquired involutional ptosis of eyelid 01/06/2012  . Entropion and trichiasis of eyelid 01/06/2012    Past Surgical History:  Procedure Laterality Date  . ABDOMINAL AORTAGRAM N/A 02/22/2012   Procedure: ABDOMINAL Ronny FlurryAORTAGRAM;  Surgeon: Nada LibmanVance W Brabham, MD;  [facility];  Service: Cardiovascular;  Laterality:  N/A;  . abdominal aortagram    . CARDIAC CATHETERIZATION  2000  . CAROTID ANGIOGRAM Bilateral 02/22/2012   Procedure: CAROTID ANGIOGRAM;  Surgeon: Nada Libman, MD;  Location[facility] C[facility] LAB;  Service: Cardiovascular;  Laterality: Bilateral;  . CAROTID STENT Left 06/23/11   By [name]  . CATARACT EXTRACTION W/ INTRAOCULAR LENS  IMPLANT, BILATERAL Bilateral   . COLONOSCOPY    . CORONARY ANGIOPLASTY WITH STENT PLACEMENT  2001-01/16/2014   "think today makes #4" (01/16/2014)  . CORONARY ARTERY BYPASS GRAFT  2000   1st in 2000 (Dr. Barry Dienes) x 4, LIMA to LAD, SVG to diagonal, SVG to OM4, SVG to PDA. Re-cath in May 2012 with patent LIMA to LAD,  patent SVG to diagonal, patent SVG to OM with left-to-right collaterals to an occluded right.  Marland Kitchen ENDARTERECTOMY FEMORAL Right 06/25/2015   Procedure: ENDARTERECTOMY RIGHT FEMORAL ARTERY ;  Surgeon: Larina Earthly, MD;  Location: [facility] OR;  Service: Vascular;  Laterality: Right;  . FEMORAL BYPASS Right ?2001  . INGUINAL HERNIA REPAIR Right 1980's  . INSERT / REPLACE / REMOVE PACEMAKER     implanted 2003 by Dr Jenne Campus with gen change (MDT [id number]) 08/2010 by [facility]  . LEFT HEART CATHETERIZATION WITH CORONARY/GRAFT ANGIOGRAM N/A 01/16/2014   Procedure: LEFT HEART CATHETERIZATION WITH Isabel Caprice;  Surgeon: Kathleene Hazel, MD;  Location: [facility] CATH LAB;  Service: Cardiovascular;  Laterality: N/A;  . PATCH ANGIOPLASTY Right 06/25/2015   Procedure: RIGHT COMMON FEMORAL ARTERY AND PROFUNDA PATCH ANGIOPLASTY USING HEMASHIELD PLATINUM FINESSE PATCH ;  Surgeon: Larina Earthly, MD;  Location: [facility] OR;  Service: Vascular;  Laterality: Right;  . PERIPHERAL VASCULAR CATHETERIZATION N/A 06/24/2015   Procedure: Abdominal Aortogram;  Surgeon: Nada Libman, MD;  Location: [facility] INVASIVE CV LAB;  Service: Cardiovascular;  Laterality: N/A;        Home Medications    Prior to Admission medications   Medication Sig Start Date End Date Taking? Authorizing Provider  acetaminophen (TYLENOL) 325 MG tablet Take 650 mg by mouth every 6 (six) hours as needed for mild pain. Reported on 11/25/2015    [provider]  amLODipine (NORVASC) 10 MG tablet Take 10 mg by mouth daily.    [provider]  aspirin 81 MG tablet Take 81 mg by mouth daily.    [provider]  atorvastatin (LIPITOR) 40 MG tablet Take 1 tablet (40 mg total) by mouth daily at 6 PM. 05/14/15   Allayne Butcher, PA-C  HYDROcodone-acetaminophen (NORCO/VICODIN) 5-325 MG tablet Take 1-2 tablets by mouth every 6 (six) hours as needed for moderate pain. 05/10/17   Vanetta Mulders, MD  isosorbide mononitrate (IMDUR) 30 MG 24 hr  tablet Take 1 tablet (30 mg total) by mouth daily. 09/19/17 12/18/17  Lars Masson, MD  meclizine (ANTIVERT) 12.5 MG tablet Take 1 tablet (12.5 mg total) by mouth 3 (three) times daily as needed for dizziness. Patient not taking: Reported on 09/19/2017 06/02/17   Richarda Overlie, MD  methocarbamol (ROBAXIN) 500 MG tablet Take 1 tablet (500 mg total) by mouth every 8 (eight) hours as needed for muscle spasms. Patient not taking: Reported on 07/05/2017 06/02/17   Richarda Overlie, MD  metoprolol succinate (TOPROL-XL) 50 MG 24 hr tablet Take 1 tablet (50 mg total) by mouth daily. Take with or immediately following a meal. 06/03/17   Richarda Overlie, MD  mirtazapine (REMERON) 15 MG tablet Take 15 mg by mouth at bedtime.    [provider]  nitroGLYCERIN (NITROSTAT) 0.4 MG SL tablet Place 1 tablet (0.4 mg total) under the tongue every 5 (five) minutes as needed. For chest pain Patient not taking: Reported on 07/05/2017 10/27/15   Lars Masson, MD  ondansetron (ZOFRAN ODT) 4 MG disintegrating tablet Take 1 tablet (4 mg total) by mouth every 8 (eight) hours as needed for nausea or vomiting. Patient not taking: Reported on 07/05/2017 05/10/17   Vanetta Mulders, MD  potassium chloride (K-DUR) 10 MEQ tablet Take 1 tablet (10 mEq total) by mouth daily. 02/26/16   Lars Masson, MD  Tamsulosin HCl (FLOMAX) 0.4 MG CAPS Take 0.4 mg by mouth daily after supper.     [provider]    Family History Family History  Problem Relation Age of Onset  . Cancer - Other Brother        Oral  . Prostate cancer Brother   . Hypertension Brother   . Hyperlipidemia Brother   . Lung disease Father        black lung disease    Social History Social History   Tobacco Use  . Smoking status: Current Every Day Smoker    Packs/day: 0.50    Years: 68.00    Pack years: 34.00    Types: Cigarettes    Last attempt to quit: 05/02/2015    Years since quitting: 2.8  . Smokeless tobacco: Never Used    Substance Use Topics  . Alcohol use: No    Alcohol/week: 0.0 oz  . Drug use: No     Allergies   Tramadol; Pravachol; Pravastatin; Simvastatin; Other; Oxycodone; and Terazosin   Review of Systems Review of Systems  Constitutional: Negative for fever.  HENT: Negative for rhinorrhea and sore throat.   Eyes: Negative for redness.  Respiratory: Positive for shortness of breath. Negative for cough.   Cardiovascular: Negative for chest pain.  Gastrointestinal: Negative for abdominal pain, diarrhea, nausea and vomiting.  Genitourinary: Negative for dysuria.  Musculoskeletal: Negative for myalgias.  Skin: Negative for rash.  Neurological: Negative for headaches.     Physical Exam Updated Vital Signs BP (!) 178/92   Pulse 66   Temp 98 F (36.7 C) (Oral)   Resp 13   SpO2 (!) 86%   Physical Exam  Constitutional: He appears well-developed and well-nourished.  HENT:  Head: Normocephalic and atraumatic.  Eyes: Conjunctivae are normal. Right eye exhibits no discharge. Left eye exhibits no discharge.  Neck: Normal range of motion. Neck supple.  Cardiovascular: Normal rate and normal heart sounds. An irregularly irregular rhythm present.  Occasional extrasystoles are present.  Pulmonary/Chest: Effort normal and breath sounds normal.  Abdominal: Soft. He exhibits pulsatile midline mass. There is no tenderness.  Neurological: He is alert.  Skin: Skin is warm and dry.  Psychiatric: He has a normal mood and affect.  Nursing note and vitals reviewed.    ED Treatments / Results  Labs (all labs ordered are listed, but only abnormal results are displayed) Labs Reviewed  BASIC METABOLIC PANEL - Abnormal; Notable for the following components:      Result Value   Potassium 3.4 (*)    BUN 23 (*)    Creatinine, Ser 2.08 (*)    Calcium 8.5 (*)    GFR calc non Af Amer 27 (*)    GFR calc Af Amer 31 (*)    All other components within normal limits  CBC - Abnormal; Notable for the  following components:   Platelets 110 (*)    All  other components within normal limits  BRAIN NATRIURETIC PEPTIDE - Abnormal; Notable for the following components:   B Natriuretic Peptide 1,407.8 (*)    All other components within normal limits  TROPONIN I - Abnormal; Notable for the following components:   Troponin I 0.19 (*)    All other components within normal limits    EKG ED ECG REPORT   Date: 03/12/2018  Rate: 71  Rhythm: sinus tachycardia  QRS Axis: left  Intervals: normal  ST/T Wave abnormalities: nonspecific ST changes  Conduction Disutrbances:none  Narrative Interpretation:   Old EKG Reviewed: unchanged  I have personally reviewed the EKG tracing and agree with the computerized printout as noted.  Radiology Dg Chest 2 View  Result Date: 03/12/2018 CLINICAL DATA:  Short of breath EXAM: CHEST - 2 VIEW COMPARISON:  05/31/2017 FINDINGS: Thoracic aortic aneurysm unchanged. Prior CABG. Negative for heart failure. Small left effusion and left lower lobe airspace disease has progressed since the prior study. Mild right lower lobe airspace disease is unchanged and may be chronic fibrosis. Underlying COPD.  Pacemaker unchanged. IMPRESSION: Left lower lobe atelectasis/infiltrate and small left effusion have progressed mildly since the prior study. COPD and probable scarring right lung base. Negative for heart failure. Thoracic aortic aneurysm unchanged. Electronically Signed   By: Marlan Palau M.D.   On: 03/12/2018 07:11    Procedures Procedures (including critical care time)  Medications Ordered in ED Medications  albuterol (PROVENTIL) (2.5 MG/3ML) 0.083% nebulizer solution 5 mg (5 mg Nebulization Given 03/12/18 0840)     Initial Impression / Assessment and Plan / ED Course  I have reviewed the triage vital signs and the nursing notes.  Pertinent labs & imaging results that were available during my care of the patient were reviewed by me and considered in my medical  decision making (see chart for details).     Patient seen and examined. Work-up initiated. CXR reviewed. It is grossly abnormal but appears relatively unchanged. EKG pending.    Vital signs reviewed and are as follows: BP (!) 184/105   Pulse (!) 50   Resp 19   SpO2 96%   Labs pending.  Currently patient is breathing comfortably in no distress.  O2 sat 94% on room air.  Currently he has no complaints.  No abdominal pain, flank pain, or chest pain at the current time.  10:50 AM Pt d/w Dr. Rosalia Hammers. EKG reviewed.   Discussed elevated troponin with patient, his daughter, and the patient's other son over the phone.  Patient is adamant about not staying in the hospital.  He states that he has an appointment with his cardiologist tomorrow for routine checkup.  He feels that they can recheck his testing at that time.  Discussed that we cannot know if his elevated troponin today is from his baseline or from a new cardiac event without further testing.  He understands this and does not want to stay in hospital.  He is willing to sign out AGAINST MEDICAL ADVICE.  I spoke with the patient's daughter and son and they are willing to watch him today.  Patient seems willing to return with development of chest pain or shortness of breath, or other symptoms.  He is invited to return if he changes his mind.    Final Clinical Impressions(s) / ED Diagnoses   Final diagnoses:  SOB (shortness of breath)   Patient with episode of shortness of breath this morning without chest pain.  Is resolved with supplemental O2 by EMS.  Patient has been asymptomatic during the emergency department stay.  Patient's troponin is been elevated several times over the past several years, as high as 0.4.  Mildly elevated today at 0.17.  Discussion was patient and family as above.  He declines to be admitted to the hospital for trending today.  Chest x-ray without signs of CHF.  Do not suspect pneumonia given normal white blood cell  count. Pt will be d/c AMA and he intends to f/u with his cardiologist tomorrow, Dr. Delton See.   ED Discharge Orders    None       Renne Crigler, PA-C 03/12/18 1054    Ward, Layla Maw, DO 03/19/18 2357

## 2018-03-12 NOTE — ED Triage Notes (Signed)
Pt from home via [facility]. [facility] [place] [name] Fire dept put pt on 4L [place] and pt stated he was feeling fine. Family wanted pt to come in regardless. Pt sts has not been feeling well for past 2 days as well. Hx of COPD, current smoker. Denies any c/o @ this time. A&O x4 on arrival.

## 2018-03-12 NOTE — ED Notes (Signed)
Notified EDP of elevated troponin.

## 2018-03-12 NOTE — ED Notes (Signed)
Delay in lab draw,  Pt not in room 

## 2018-03-12 NOTE — ED Notes (Signed)
Called lab to check on Troponin. Lab states it will be released in 10 min.

## 2018-03-12 NOTE — Discharge Instructions (Signed)
Please read and follow all provided instructions.  Your diagnoses today include:  1. SOB (shortness of breath)     Tests performed today include:  An EKG of your heart  A chest x-ray  Cardiac enzymes - a blood test for heart muscle damage -- is elevated today, it is unclear if this is from a heart attack  Blood counts and electrolytes  Vital signs. See below for your results today.   Medications prescribed:   None  Take any prescribed medications only as directed.  Follow-up instructions: Please follow-up with your primary care provider as soon as you can for further evaluation of your symptoms.   Return instructions:  SEEK IMMEDIATE MEDICAL ATTENTION IF:  You have severe chest pain, especially if the pain is crushing or pressure-like and spreads to the arms, back, neck, or jaw, or if you have sweating, nausea (feeling sick to your stomach), or shortness of breath. THIS IS AN EMERGENCY. Don't wait to see if the pain will go away. Get medical help at once. Call 911 or [phone] (operator). DO NOT drive yourself to the hospital.   Your chest pain gets worse and does not go away with rest.   You have an attack of chest pain lasting longer than usual, despite rest and treatment with the medications your caregiver has prescribed.   You wake from sleep with chest pain or shortness of breath.  You feel dizzy or faint.  You have chest pain not typical of your usual pain for which you originally saw your caregiver.   You have any other emergent concerns regarding your health.  Additional Information: Chest pain comes from many different causes. Your caregiver has diagnosed you as having chest pain that is not specific for one problem, but does not require admission.  You are at low risk for an acute heart condition or other serious illness.   Your vital signs today were: BP (!) 178/92    Pulse 66    Temp 98 F (36.7 C) (Oral)    Resp 13    SpO2 (!) 86%  If your blood pressure (BP)  was elevated above 135/85 this visit, please have this repeated by your doctor within one month. --------------

## 2018-03-13 ENCOUNTER — Encounter: Payer: Self-pay | Admitting: Cardiology

## 2018-03-13 ENCOUNTER — Ambulatory Visit: Payer: Medicare Other | Admitting: Cardiology

## 2018-03-13 VITALS — BP 130/80 | HR 62 | Ht 69.0 in | Wt 138.6 lb

## 2018-03-13 DIAGNOSIS — I2510 Atherosclerotic heart disease of native coronary artery without angina pectoris: Secondary | ICD-10-CM

## 2018-03-13 DIAGNOSIS — I712 Thoracic aortic aneurysm, without rupture, unspecified: Secondary | ICD-10-CM

## 2018-03-13 DIAGNOSIS — I951 Orthostatic hypotension: Secondary | ICD-10-CM

## 2018-03-13 DIAGNOSIS — Z950 Presence of cardiac pacemaker: Secondary | ICD-10-CM

## 2018-03-13 DIAGNOSIS — I5033 Acute on chronic diastolic (congestive) heart failure: Secondary | ICD-10-CM

## 2018-03-13 MED ORDER — ISOSORBIDE MONONITRATE ER 30 MG PO TB24: 15.0000 mg | ORAL_TABLET | Freq: Every day | ORAL | 11 refills | Status: AC

## 2018-03-13 MED ORDER — METOPROLOL SUCCINATE ER 25 MG PO TB24: 25.0000 mg | ORAL_TABLET | Freq: Every day | ORAL | 11 refills | Status: DC

## 2018-03-13 MED ORDER — SPIRONOLACTONE 25 MG PO TABS: 12.5000 mg | ORAL_TABLET | Freq: Every day | ORAL | 11 refills | Status: DC
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
## 2018-03-13 NOTE — Patient Instructions (Addendum)
Medication Instructions:  1) DECREASE IMDUR to 15 mg daily (you will have to cut 30 mg in half) 2) DECREASE TOPROL to 25 mg daily 2) START SPIRONOLACTONE 12.5 mg daily (you will have to cut 25 mg in half)  Labwork: You will have labs drawn when you return next week.  Testing/Procedures: None  Follow-Up: You have an appointment scheduled 03/21/18 (next Tuesday!) at 11:45AM. Please arrive 15 minutes prior to your appointment time.   Any Other Special Instructions Will Be Listed Below (If Applicable).     If you need a refill on your cardiac medications before your next appointment, please call your pharmacy.

## 2018-03-15 ENCOUNTER — Encounter: Payer: Self-pay | Admitting: Physician Assistant

## 2018-03-16 ENCOUNTER — Inpatient Hospital Stay (HOSPITAL_COMMUNITY): Payer: Medicare Other

## 2018-03-16 ENCOUNTER — Other Ambulatory Visit: Payer: Self-pay

## 2018-03-16 ENCOUNTER — Encounter (HOSPITAL_COMMUNITY): Payer: Self-pay

## 2018-03-16 ENCOUNTER — Emergency Department (HOSPITAL_COMMUNITY): Payer: Medicare Other

## 2018-03-16 ENCOUNTER — Inpatient Hospital Stay (HOSPITAL_COMMUNITY)
Admission: EM | Admit: 2018-03-16 | Discharge: 2018-03-19 | DRG: 189 | Disposition: A | Discharge status: Home / Self Care | Payer: Medicare Other | Attending: Family Medicine | Admitting: Family Medicine

## 2018-03-16 DIAGNOSIS — Z885 Allergy status to narcotic agent status: Secondary | ICD-10-CM

## 2018-03-16 DIAGNOSIS — I252 Old myocardial infarction: Secondary | ICD-10-CM | POA: Diagnosis not present

## 2018-03-16 DIAGNOSIS — J410 Simple chronic bronchitis: Secondary | ICD-10-CM

## 2018-03-16 DIAGNOSIS — I130 Hypertensive heart and chronic kidney disease with heart failure and stage 1 through stage 4 chronic kidney disease, or unspecified chronic kidney disease: Secondary | ICD-10-CM | POA: Diagnosis present

## 2018-03-16 DIAGNOSIS — R0602 Shortness of breath: Secondary | ICD-10-CM | POA: Diagnosis present

## 2018-03-16 DIAGNOSIS — Z808 Family history of malignant neoplasm of other organs or systems: Secondary | ICD-10-CM

## 2018-03-16 DIAGNOSIS — R918 Other nonspecific abnormal finding of lung field: Secondary | ICD-10-CM | POA: Diagnosis present

## 2018-03-16 DIAGNOSIS — N182 Chronic kidney disease, stage 2 (mild): Secondary | ICD-10-CM | POA: Diagnosis present

## 2018-03-16 DIAGNOSIS — I951 Orthostatic hypotension: Secondary | ICD-10-CM | POA: Diagnosis present

## 2018-03-16 DIAGNOSIS — Z888 Allergy status to other drugs, medicaments and biological substances status: Secondary | ICD-10-CM

## 2018-03-16 DIAGNOSIS — Z961 Presence of intraocular lens: Secondary | ICD-10-CM | POA: Diagnosis present

## 2018-03-16 DIAGNOSIS — Z950 Presence of cardiac pacemaker: Secondary | ICD-10-CM | POA: Diagnosis not present

## 2018-03-16 DIAGNOSIS — Z951 Presence of aortocoronary bypass graft: Secondary | ICD-10-CM | POA: Diagnosis not present

## 2018-03-16 DIAGNOSIS — Z836 Family history of other diseases of the respiratory system: Secondary | ICD-10-CM

## 2018-03-16 DIAGNOSIS — R778 Other specified abnormalities of plasma proteins: Secondary | ICD-10-CM

## 2018-03-16 DIAGNOSIS — I739 Peripheral vascular disease, unspecified: Secondary | ICD-10-CM | POA: Diagnosis present

## 2018-03-16 DIAGNOSIS — R296 Repeated falls: Secondary | ICD-10-CM | POA: Diagnosis present

## 2018-03-16 DIAGNOSIS — F17210 Nicotine dependence, cigarettes, uncomplicated: Secondary | ICD-10-CM | POA: Diagnosis present

## 2018-03-16 DIAGNOSIS — I5033 Acute on chronic diastolic (congestive) heart failure: Secondary | ICD-10-CM | POA: Diagnosis present

## 2018-03-16 DIAGNOSIS — Z91018 Allergy to other foods: Secondary | ICD-10-CM

## 2018-03-16 DIAGNOSIS — I493 Ventricular premature depolarization: Secondary | ICD-10-CM | POA: Diagnosis present

## 2018-03-16 DIAGNOSIS — I509 Heart failure, unspecified: Secondary | ICD-10-CM

## 2018-03-16 DIAGNOSIS — Z8673 Personal history of transient ischemic attack (TIA), and cerebral infarction without residual deficits: Secondary | ICD-10-CM | POA: Diagnosis not present

## 2018-03-16 DIAGNOSIS — N400 Enlarged prostate without lower urinary tract symptoms: Secondary | ICD-10-CM | POA: Diagnosis present

## 2018-03-16 DIAGNOSIS — Z8679 Personal history of other diseases of the circulatory system: Secondary | ICD-10-CM

## 2018-03-16 DIAGNOSIS — Z79899 Other long term (current) drug therapy: Secondary | ICD-10-CM

## 2018-03-16 DIAGNOSIS — Z955 Presence of coronary angioplasty implant and graft: Secondary | ICD-10-CM

## 2018-03-16 DIAGNOSIS — J9600 Acute respiratory failure, unspecified whether with hypoxia or hypercapnia: Secondary | ICD-10-CM | POA: Diagnosis present

## 2018-03-16 DIAGNOSIS — Z8042 Family history of malignant neoplasm of prostate: Secondary | ICD-10-CM

## 2018-03-16 DIAGNOSIS — J9621 Acute and chronic respiratory failure with hypoxia: Secondary | ICD-10-CM | POA: Diagnosis present

## 2018-03-16 DIAGNOSIS — I712 Thoracic aortic aneurysm, without rupture: Secondary | ICD-10-CM | POA: Diagnosis present

## 2018-03-16 DIAGNOSIS — E039 Hypothyroidism, unspecified: Secondary | ICD-10-CM | POA: Diagnosis present

## 2018-03-16 DIAGNOSIS — Z95828 Presence of other vascular implants and grafts: Secondary | ICD-10-CM

## 2018-03-16 DIAGNOSIS — I2510 Atherosclerotic heart disease of native coronary artery without angina pectoris: Secondary | ICD-10-CM | POA: Diagnosis present

## 2018-03-16 DIAGNOSIS — I10 Essential (primary) hypertension: Secondary | ICD-10-CM | POA: Diagnosis not present

## 2018-03-16 DIAGNOSIS — Z9842 Cataract extraction status, left eye: Secondary | ICD-10-CM

## 2018-03-16 DIAGNOSIS — Z9181 History of falling: Secondary | ICD-10-CM

## 2018-03-16 DIAGNOSIS — J441 Chronic obstructive pulmonary disease with (acute) exacerbation: Secondary | ICD-10-CM | POA: Diagnosis present

## 2018-03-16 DIAGNOSIS — Z8249 Family history of ischemic heart disease and other diseases of the circulatory system: Secondary | ICD-10-CM

## 2018-03-16 DIAGNOSIS — E785 Hyperlipidemia, unspecified: Secondary | ICD-10-CM | POA: Diagnosis present

## 2018-03-16 DIAGNOSIS — I5032 Chronic diastolic (congestive) heart failure: Secondary | ICD-10-CM | POA: Diagnosis present

## 2018-03-16 DIAGNOSIS — Z8349 Family history of other endocrine, nutritional and metabolic diseases: Secondary | ICD-10-CM

## 2018-03-16 DIAGNOSIS — Z9841 Cataract extraction status, right eye: Secondary | ICD-10-CM

## 2018-03-16 DIAGNOSIS — Z7982 Long term (current) use of aspirin: Secondary | ICD-10-CM

## 2018-03-16 DIAGNOSIS — R7989 Other specified abnormal findings of blood chemistry: Secondary | ICD-10-CM

## 2018-03-16 DIAGNOSIS — I491 Atrial premature depolarization: Secondary | ICD-10-CM | POA: Diagnosis present

## 2018-03-16 LAB — CBC WITH DIFFERENTIAL/PLATELET
BASOS PCT: 1 %
Basophils Absolute: 0 10*3/uL (ref 0.0–0.1)
EOS ABS: 0 10*3/uL (ref 0.0–0.7)
EOS PCT: 0 %
HCT: 40.4 % (ref 39.0–52.0)
Hemoglobin: 13.6 g/dL (ref 13.0–17.0)
LYMPHS ABS: 1.8 10*3/uL (ref 0.7–4.0)
Lymphocytes Relative: 22 %
MCH: 32.4 pg (ref 26.0–34.0)
MCHC: 33.7 g/dL (ref 30.0–36.0)
MCV: 96.2 fL (ref 78.0–100.0)
MONOS PCT: 4 %
Monocytes Absolute: 0.4 10*3/uL (ref 0.1–1.0)
Neutro Abs: 6.1 10*3/uL (ref 1.7–7.7)
Neutrophils Relative %: 73 %
PLATELETS: 120 10*3/uL — AB (ref 150–400)
RBC: 4.2 MIL/uL — ABNORMAL LOW (ref 4.22–5.81)
RDW: 15.7 % — AB (ref 11.5–15.5)
WBC: 8.3 10*3/uL (ref 4.0–10.5)

## 2018-03-16 LAB — BASIC METABOLIC PANEL
Anion gap: 12 (ref 5–15)
BUN: 19 mg/dL (ref 6–20)
CALCIUM: 8.6 mg/dL — AB (ref 8.9–10.3)
CO2: 21 mmol/L — ABNORMAL LOW (ref 22–32)
CREATININE: 2.15 mg/dL — AB (ref 0.61–1.24)
Chloride: 111 mmol/L (ref 101–111)
GFR, EST AFRICAN AMERICAN: 30 mL/min — AB (ref >60)
GFR, EST NON AFRICAN AMERICAN: 26 mL/min — AB (ref >60)
Glucose, Bld: 112 mg/dL — ABNORMAL HIGH (ref 65–99)
Potassium: 3.7 mmol/L (ref 3.5–5.1)
Sodium: 144 mmol/L (ref 135–145)

## 2018-03-16 LAB — BRAIN NATRIURETIC PEPTIDE: B NATRIURETIC PEPTIDE 5: 2630.6 pg/mL — AB (ref 0.0–100.0)

## 2018-03-16 LAB — TROPONIN I: Troponin I: 0.21 ng/mL (ref <0.03)

## 2018-03-16 MED ORDER — POTASSIUM CHLORIDE ER 10 MEQ PO TBCR: 10.0000 meq | EXTENDED_RELEASE_TABLET | Freq: Every day | ORAL | Status: DC

## 2018-03-16 MED ORDER — ISOSORBIDE MONONITRATE ER 30 MG PO TB24
15.0000 mg | ORAL_TABLET | Freq: Every day | ORAL | Status: DC
  Administered 2018-03-17 – 2018-03-19 (×3): 15 mg via ORAL
  Filled 2018-03-16 (×4): qty 1

## 2018-03-16 MED ORDER — METHYLPREDNISOLONE SODIUM SUCC 125 MG IJ SOLR
125.0000 mg | Freq: Once | INTRAMUSCULAR | Status: AC
  Administered 2018-03-16: 125 mg via INTRAVENOUS
  Filled 2018-03-16: qty 2

## 2018-03-16 MED ORDER — MIRTAZAPINE 7.5 MG PO TABS
15.0000 mg | ORAL_TABLET | Freq: Every day | ORAL | Status: DC
Start: 2018-03-16 — End: 2018-03-19
  Administered 2018-03-16 – 2018-03-18 (×3): 15 mg via ORAL
  Filled 2018-03-16 (×2): qty 1
  Filled 2018-03-16 (×2): qty 2

## 2018-03-16 MED ORDER — FUROSEMIDE 10 MG/ML IJ SOLN
40.0000 mg | Freq: Once | INTRAMUSCULAR | Status: AC
  Administered 2018-03-16: 40 mg via INTRAVENOUS
  Filled 2018-03-16: qty 4

## 2018-03-16 MED ORDER — NITROGLYCERIN 0.4 MG SL SUBL: 0.4000 mg | SUBLINGUAL_TABLET | SUBLINGUAL | Status: DC | PRN

## 2018-03-16 MED ORDER — ALBUTEROL SULFATE (2.5 MG/3ML) 0.083% IN NEBU: 2.5000 mg | INHALATION_SOLUTION | Freq: Once | RESPIRATORY_TRACT | Status: DC

## 2018-03-16 MED ORDER — IPRATROPIUM-ALBUTEROL 0.5-2.5 (3) MG/3ML IN SOLN
3.0000 mL | Freq: Once | RESPIRATORY_TRACT | Status: AC
  Administered 2018-03-16: 3 mL via RESPIRATORY_TRACT
  Filled 2018-03-16: qty 3

## 2018-03-16 MED ORDER — AMLODIPINE BESYLATE 5 MG PO TABS
10.0000 mg | ORAL_TABLET | Freq: Every day | ORAL | Status: DC
  Administered 2018-03-16 – 2018-03-17 (×2): 10 mg via ORAL
  Filled 2018-03-16 (×2): qty 2

## 2018-03-16 MED ORDER — SPIRONOLACTONE 12.5 MG HALF TABLET
12.5000 mg | ORAL_TABLET | Freq: Every day | ORAL | Status: DC
  Administered 2018-03-16 – 2018-03-18 (×3): 12.5 mg via ORAL
  Filled 2018-03-16 (×4): qty 1

## 2018-03-16 MED ORDER — ALBUTEROL SULFATE (2.5 MG/3ML) 0.083% IN NEBU
2.5000 mg | INHALATION_SOLUTION | Freq: Once | RESPIRATORY_TRACT | Status: AC
  Administered 2018-03-16: 2.5 mg via RESPIRATORY_TRACT
  Filled 2018-03-16: qty 3

## 2018-03-16 MED ORDER — METHYLPREDNISOLONE SODIUM SUCC 125 MG IJ SOLR
125.0000 mg | Freq: Two times a day (BID) | INTRAMUSCULAR | Status: DC
  Administered 2018-03-16: 125 mg via INTRAVENOUS
  Filled 2018-03-16: qty 2

## 2018-03-16 MED ORDER — ASPIRIN EC 81 MG PO TBEC
81.0000 mg | DELAYED_RELEASE_TABLET | Freq: Every day | ORAL | Status: DC
  Administered 2018-03-16 – 2018-03-19 (×4): 81 mg via ORAL
  Filled 2018-03-16 (×4): qty 1

## 2018-03-16 MED ORDER — ALBUTEROL SULFATE (2.5 MG/3ML) 0.083% IN NEBU: 2.5000 mg | INHALATION_SOLUTION | RESPIRATORY_TRACT | Status: DC | PRN

## 2018-03-16 MED ORDER — METHYLPREDNISOLONE SODIUM SUCC 125 MG IJ SOLR: 125.0000 mg | Freq: Two times a day (BID) | INTRAMUSCULAR | Status: DC

## 2018-03-16 MED ORDER — METOPROLOL SUCCINATE ER 25 MG PO TB24
25.0000 mg | ORAL_TABLET | Freq: Every day | ORAL | Status: DC
  Administered 2018-03-16 – 2018-03-19 (×4): 25 mg via ORAL
  Filled 2018-03-16 (×6): qty 1

## 2018-03-16 MED ORDER — SODIUM CHLORIDE 0.9 % IV SOLN
1.0000 g | INTRAVENOUS | Status: DC
  Administered 2018-03-16: 1 g via INTRAVENOUS
  Filled 2018-03-16: qty 10

## 2018-03-16 MED ORDER — IPRATROPIUM-ALBUTEROL 0.5-2.5 (3) MG/3ML IN SOLN
3.0000 mL | Freq: Four times a day (QID) | RESPIRATORY_TRACT | Status: DC
  Administered 2018-03-16 – 2018-03-17 (×4): 3 mL via RESPIRATORY_TRACT
  Filled 2018-03-16 (×4): qty 3

## 2018-03-16 MED ORDER — TAMSULOSIN HCL 0.4 MG PO CAPS
0.4000 mg | ORAL_CAPSULE | Freq: Every day | ORAL | Status: DC
  Administered 2018-03-16 – 2018-03-17 (×2): 0.4 mg via ORAL
  Filled 2018-03-16 (×3): qty 1

## 2018-03-16 MED ORDER — GUAIFENESIN ER 600 MG PO TB12
1200.0000 mg | ORAL_TABLET | Freq: Once | ORAL | Status: DC
  Filled 2018-03-16: qty 2

## 2018-03-16 MED ORDER — ATORVASTATIN CALCIUM 40 MG PO TABS
40.0000 mg | ORAL_TABLET | Freq: Every day | ORAL | Status: DC
  Administered 2018-03-18: 40 mg via ORAL
  Filled 2018-03-16 (×2): qty 1

## 2018-03-16 NOTE — Progress Notes (Signed)
[name] is a 84 y.o. male with a histyro of CHF. He has a medtronic dual chamber pacemaker implanted for syncope. Currently, he is having PACs and PVCs. Would not aggressively treat at this time unless he becomes symptomatic. If so, would increase Toprol XL to 100 mg. No further changes necessary.  Louis Proudfoot Elberta Fortisamnitz, MD 03/16/2018 2:46 PM

## 2018-03-16 NOTE — ED Notes (Signed)
PAGED ADMITTING PER RN  

## 2018-03-16 NOTE — ED Notes (Signed)
Hobbs MD advises if pt is able to maintain O2 > 92% on

## 2018-03-16 NOTE — ED Provider Notes (Signed)
[facility] EMERGENCY DEPARTMENT Provider Note   CSN: [id number] Arrival date & time: 03/16/18  1053     History   Chief Complaint Chief Complaint  Patient presents with  . Shortness of Breath    HPI [name] is a 84 y.o. male past medical history of AAA, CAD, COPD, CHF who presents for evaluation of shortness of breath.  Patient reports that for the last 2 nights, he has had worsening shortness of breath.  He states that it became acutely worse this morning.  He states he is also had nonproductive cough.  Daughter states that EMS was called and on their arrival, patient had O2 sats with 89% on room air.  Patient was placed on 2 L O2 via nasal cannula which improved his oxygenation to 95%.  Patient was seen here recently on 03/12/18 for evaluation of shortness of breath.  At that time, his troponin was elevated.  He does have a history of elevated troponins.  Patient did not wish to stay in the hospital at that time.  He had a follow-up appointment with his cardiologist the next day and stated that he would rather have them check it.  Patient ended signing out AGAINST MEDICAL ADVICE.  Patient comes in today because the shortness of breath has continued.  Shortness of breath is worsened with exertion.  He is not on any oxygen requirements at home.  He denies any fevers, chest pain, abdominal pain, nausea/vomiting.  The history is provided by the patient and a relative.    Past Medical History:  Diagnosis Date  . AAA (abdominal aortic aneurysm) (HCC)    Has a known AAA of 3.2 x 3.4 cm by abdominal untrasound 03/27/12. Followup abdominal ultrasound on 03/29/13 showed a diameter of 3.6 cm, which is stable  . Abdominal bruit    Loud  . CAD (coronary artery disease)    a. s/p MI in 1988;  b. s/p CABG in 2000 (Dr. Barry Dienes) x 4, LIMA to LAD, SVG to diagonal, SVG to [facility], SVG to PDA;  c. 12/2013 NSTEMI/abnl MV;  d. 01/2014 Cath/PCI: native 3VD, VG->RCA 100, VG->OM aneurysmal 40ost/m,  79m(5.0x12 Veriflex BMS), VG->Diag aneurysmal, LIMA->LAD nl.  . Chronic systolic CHF (congestive heart failure) (HCC)    Hospitalization for CHF from 05/04/13-05/06/13. Treated medically. Had possible pneumonia with short course of antibiotics.  Marland Kitchen COPD (chronic obstructive pulmonary disease) (HCC)   . CVA (cerebral infarction) 07/2011   Remote left brain infarct with sensory deficits and had a left internal carotid stent placed by Dr. [name] at that time, no recurrent CVA or TIAs or CNS events.  . Headache   . Hyperlipidemia   . Hypertension   . Hypothyroid    On supplement "don't know if I take RX or not for my thyroid" (01/16/2014)  . Orthostatic hypotension   . PAD (peripheral artery disease) (HCC)    Stable. Had a past RFPBG by Dr. [name] in 2009. this graft is occluded with reconstitution in the popliteal artery, which was patent. Right tibial was occluded and 2-vessel runoff on angiography done by [name] in 12/2010.  Marland Kitchen Peripheral vascular disease (HCC)   . Pneumonia   . Presence of permanent cardiac pacemaker   . Sick sinus syndrome (HCC)    PPM implanted for this and syncope   . Stroke (HCC)   . Syncope    tilt table test/8.10.01/orthostatic hypotension  . TIA (transient ischemic attack)     Patient Active Problem List  Diagnosis Date Noted  . Vertigo   . High blood pressure 03/28/2016  . Encounter for follow-up examination after completed treatment for conditions other than malignant neoplasm 03/28/2016  . Thoracic aortic aneurysm without rupture (HCC) 03/15/2016  . Coronary artery disease due to lipid rich plaque   . Chest pain   . Abdominal aortic aneurysm (AAA) without rupture (HCC)   . Hypertensive emergency   . Acute on chronic systolic CHF (congestive heart failure) (HCC)   . Personal history of other medical treatment 02/11/2016  . Left flank pain, chronic 02/11/2016  . Pyelonephritis 02/11/2016  . AAA (abdominal aortic aneurysm) (HCC) 02/09/2016  . Anxiety  02/09/2016  . Arteriosclerotic cardiovascular disease (ASCVD) 02/09/2016  . Atrial fibrillation (HCC) 02/09/2016  . Benign prostatic hyperplasia 02/09/2016  . Bilateral leg edema 02/09/2016  . CAD of autologous arterial graft 02/09/2016  . Chronic kidney disease 02/09/2016  . Congestive heart failure (CHF) (HCC) 02/09/2016  . Diaphragmatic hernia 02/09/2016  . Hemorrhagic stroke (HCC) 02/09/2016  . Peptic ulcer without hemorrhage or perforation 02/09/2016  . Peripheral arteriosclerosis (HCC) 02/09/2016  . Peripheral vascular disease (HCC) 02/09/2016  . Abdominal pain 01/29/2016  . Nausea 01/29/2016  . UTI (urinary tract infection) 01/29/2016  . Right leg pain 01/29/2016  . AP (abdominal pain)   . Hematuria 07/04/2015  . Faintness   . Post-operative complication 07/03/2015  . Postoperative anemia due to acute blood loss 07/03/2015  . Anemia due to blood loss   . Gross hematuria   . H/O endarterectomy   . BPH (benign prostatic hyperplasia) 06/30/2015  . PAD (peripheral artery disease) (HCC) 06/10/2015  . Elevated troponin 05/13/2015  . Nontraumatic cortical hemorrhage of cerebral hemisphere (HCC) 01/31/2015  . Essential hypertension 01/31/2015  . Orthostatic hypotension 01/31/2015  . HLD (hyperlipidemia) 01/31/2015  . Coronary artery disease involving native coronary artery of native heart with angina pectoris (HCC) 01/31/2015  . PVD (peripheral vascular disease) (HCC) 01/31/2015  . Cardiac pacemaker in situ 01/31/2015  . Presence of cardiac pacemaker 01/31/2015  . Right sided weakness   . Stroke (HCC)   . ICH (intracerebral hemorrhage) (HCC) 11/17/2014  . CAP (community acquired pneumonia) 06/09/2014  . Acute renal failure superimposed on stage 3 chronic kidney disease (HCC) 06/09/2014  . CHF (congestive heart failure) (HCC) 06/09/2014  . Acute-on-chronic renal failure (HCC) 06/09/2014  . Unstable angina (HCC) 01/16/2014  . COPD with acute exacerbation (HCC) 01/04/2014  .  Influenza with respiratory manifestations 01/02/2014  . Acute on chronic kidney failure (HCC) 01/02/2014  . Leukocytosis, unspecified 01/02/2014  . Malnutrition of moderate degree (HCC) 01/01/2014  . Acute bronchitis 12/30/2013  . Chronic combined systolic and diastolic CHF (congestive heart failure) (HCC) 12/30/2013  . NSTEMI (non-ST elevated myocardial infarction) (HCC) 12/30/2013  . CAD (coronary artery disease) of artery bypass graft 12/13/2013  . Sick sinus syndrome [facility]) s/p [employer] PPM 12/13/2013  . Acute on chronic combined systolic and diastolic CHF (congestive heart failure) (HCC) 05/04/2013  . HTN (hypertension) 05/04/2013  . Hyperlipidemia 05/04/2013  . Tobacco abuse 05/04/2013  . History of CVA (cerebrovascular accident) 05/04/2013  . Thrombocytopenia (HCC) 05/04/2013  . Acute on chronic combined systolic and diastolic congestive heart failure (HCC) 05/04/2013  . History of stroke 05/04/2013  . Colon polyp 04/19/2012  . Occlusion and stenosis of carotid artery without mention of cerebral infarction 02/07/2012  . Acquired involutional ptosis of eyelid 01/06/2012  . Entropion and trichiasis of eyelid 01/06/2012    Past Surgical History:  Procedure Laterality Date  .  ABDOMINAL AORTAGRAM N/A 02/22/2012   Procedure: ABDOMINAL Ronny Flurry;  Surgeon: Nada Libman, MD;  Location: [facility] CATH LAB;  Service: Cardiovascular;  Laterality: N/A;  . abdominal aortagram    . CARDIAC CATHETERIZATION  2000  . CAROTID ANGIOGRAM Bilateral 02/22/2012   Procedure: CAROTID ANGIOGRAM;  Surgeon: Nada Libman, MD;  Location: [facility] CATH LAB;  Service: Cardiovascular;  Laterality: Bilateral;  . CAROTID STENT Left 06/23/11   By [name]  . CATARACT EXTRACTION W/ INTRAOCULAR LENS  IMPLANT, BILATERAL Bilateral   . COLONOSCOPY    . CORONARY ANGIOPLASTY WITH STENT PLACEMENT  2001-01/16/2014   "think today makes #4" (01/16/2014)  . CORONARY ARTERY BYPASS GRAFT  2000   1st in 2000 (Dr. Barry Dienes) x 4, LIMA  to LAD, SVG to diagonal, SVG to OM4, SVG to PDA. Re-cath in May 2012 with patent LIMA to LAD, patent SVG to diagonal, patent SVG to OM with left-to-right collaterals to an occluded right.  Marland Kitchen ENDARTERECTOMY FEMORAL Right 06/25/2015   Procedure: ENDARTERECTOMY RIGHT FEMORAL ARTERY ;  Surgeon: Larina Earthly, MD;  Location: [facility] OR;  Service: Vascular;  Laterality: Right;  . FEMORAL BYPASS Right ?2001  . INGUINAL HERNIA REPAIR Right 1980's  . INSERT / REPLACE / REMOVE PACEMAKER     implanted 2003 by Dr Jenne Campus with gen change (MDT [id number]) 08/2010 by [facility]  . LEFT HEART CATHETERIZATION WITH CORONARY/GRAFT ANGIOGRAM N/A 01/16/2014   Procedure: LEFT HEART CATHETERIZATION WITH Isabel Caprice;  Surgeon: Kathleene Hazel, MD;  Location: [facility] CATH LAB;  Service: Cardiovascular;  Laterality: N/A;  . PATCH ANGIOPLASTY Right 06/25/2015   Procedure: RIGHT COMMON FEMORAL ARTERY AND PROFUNDA PATCH ANGIOPLASTY USING HEMASHIELD PLATINUM FINESSE PATCH ;  Surgeon: Larina Earthly, MD;  Location: [facility] OR;  Service: Vascular;  Laterality: Right;  . PERIPHERAL VASCULAR CATHETERIZATION N/A 06/24/2015   Procedure: Abdominal Aortogram;  Surgeon: Nada Libman, MD;  Location: [facility] INVASIVE CV LAB;  Service: Cardiovascular;  Laterality: N/A;        Home Medications    Prior to Admission medications   Medication Sig Start Date End Date Taking? Authorizing Provider  acetaminophen (TYLENOL) 325 MG tablet Take 650 mg by mouth every 6 (six) hours as needed for mild pain. Reported on 11/25/2015   Yes [provider]  amLODipine (NORVASC) 10 MG tablet Take 10 mg by mouth daily.   Yes [provider]  aspirin 81 MG tablet Take 81 mg by mouth daily.   Yes [provider]  atorvastatin (LIPITOR) 40 MG tablet Take 1 tablet (40 mg total) by mouth daily at 6 PM. 05/14/15  Yes Robbie Lis M, PA-C  isosorbide mononitrate (IMDUR) 30 MG 24 hr tablet Take 0.5 tablets (15 mg total) by mouth daily. 03/13/18  03/08/19 Yes Lars Masson, MD  metoprolol succinate (TOPROL-XL) 25 MG 24 hr tablet Take 1 tablet (25 mg total) by mouth daily. Take with or immediately following a meal. 03/13/18 03/08/19 Yes Lars Masson, MD  mirtazapine (REMERON) 15 MG tablet Take 15 mg by mouth at bedtime.   Yes [provider]  spironolactone (ALDACTONE) 25 MG tablet Take 0.5 tablets (12.5 mg total) by mouth daily. 03/13/18 03/08/19 Yes Lars Masson, MD  Tamsulosin HCl (FLOMAX) 0.4 MG CAPS Take 0.4 mg by mouth daily after supper.    Yes [provider]  nitroGLYCERIN (NITROSTAT) 0.4 MG SL tablet Place 1 tablet (0.4 mg total) under the tongue every 5 (five) minutes as needed. For chest pain 10/27/15  Lars MassonNelson, Katarina H, MD  potassium chloride (K-DUR) 10 MEQ tablet Take 1 tablet (10 mEq total) by mouth daily. Patient not taking: Reported on 03/16/2018 02/26/16   Lars MassonNelson, Katarina H, MD    Family History Family History  Problem Relation Age of Onset  . Cancer - Other Brother        Oral  . Prostate cancer Brother   . Hypertension Brother   . Hyperlipidemia Brother   . Lung disease Father        black lung disease    Social History Social History   Tobacco Use  . Smoking status: Current Every Day Smoker    Packs/day: 0.50    Years: 68.00    Pack years: 34.00    Types: Cigarettes  . Smokeless tobacco: Never Used  Substance Use Topics  . Alcohol use: No    Alcohol/week: 0.0 oz  . Drug use: No     Allergies   Tramadol; Pravachol; Pravastatin; Simvastatin; Other; Oxycodone; and Terazosin   Review of Systems Review of Systems  Constitutional: Negative for chills and fever.  Respiratory: Positive for cough and shortness of breath.   Cardiovascular: Negative for chest pain.  Gastrointestinal: Negative for abdominal pain, diarrhea, nausea and vomiting.  Genitourinary: Negative for dysuria and hematuria.  Musculoskeletal: Negative for back pain and neck pain.  Skin: Negative for rash.   Neurological: Negative for dizziness, weakness, numbness and headaches.  All other systems reviewed and are negative.    Physical Exam Updated Vital Signs BP (!) 167/111   Pulse 87   Temp 97.9 F (36.6 C) (Oral)   Resp (!) 27   Ht 5\' 9"  (1.753 m)   Wt 62.6 kg (138 lb)   SpO2 96%   BMI 20.38 kg/m   Physical Exam  Constitutional: He is oriented to person, place, and time. He appears well-developed and well-nourished.  HENT:  Head: Normocephalic and atraumatic.  Mouth/Throat: Oropharynx is clear and moist and mucous membranes are normal.  Eyes: Pupils are equal, round, and reactive to light. Conjunctivae, EOM and lids are normal.  Neck: Full passive range of motion without pain.  Cardiovascular: Normal rate, regular rhythm, normal heart sounds and normal pulses. Exam reveals no gallop and no friction rub.  No murmur heard. Pulses:      Radial pulses are 2+ on the right side, and 2+ on the left side.  Pulmonary/Chest: Effort normal and breath sounds normal.  Some mild increased work of breathing. No evidence of respiratory distress, though is speaking in short sentences.   Abdominal: Soft. Normal appearance. There is no tenderness. There is no rigidity and no guarding.  Musculoskeletal: Normal range of motion.  BLE are symmetric in appearance.   Neurological: He is alert and oriented to person, place, and time.  Skin: Skin is warm and dry. Capillary refill takes less than 2 seconds.  Psychiatric: He has a normal mood and affect. His speech is normal.  Nursing note and vitals reviewed.    ED Treatments / Results  Labs (all labs ordered are listed, but only abnormal results are displayed) Labs Reviewed  CBC WITH DIFFERENTIAL/PLATELET - Abnormal; Notable for the following components:      Result Value   RBC 4.20 (*)    RDW 15.7 (*)    Platelets 120 (*)    All other components within normal limits  BASIC METABOLIC PANEL - Abnormal; Notable for the following components:    CO2 21 (*)    Glucose, Bld 112 (*)  Creatinine, Ser 2.15 (*)    Calcium 8.6 (*)    GFR calc non Af Amer 26 (*)    GFR calc Af Amer 30 (*)    All other components within normal limits  BRAIN NATRIURETIC PEPTIDE - Abnormal; Notable for the following components:   B Natriuretic Peptide 2,630.6 (*)    All other components within normal limits  TROPONIN I - Abnormal; Notable for the following components:   Troponin I 0.21 (*)    All other components within normal limits    EKG EKG Interpretation  Date/Time:  Thursday March 16 2018 10:58:22 EDT Ventricular Rate:  89 PR Interval:    QRS Duration: 157 QT Interval:  466 QTC Calculation: 524 R Axis:   -77 Text Interpretation:  Atrial fibrillation LVH with IVCD, LAD and secondary repol abnrm Lateral leads are also involved Prolonged QT interval Probable RV involvement, suggest recording right precordial leads Confirmed by Kristine Royal [phone]) on 03/16/2018 11:10:31 AM   Radiology Dg Chest 2 View  Result Date: 03/16/2018 CLINICAL DATA:  Shortness of breath and cough for 2 days, history stroke, coronary artery disease post CABG, CHF, COPD, hypertension EXAM: CHEST - 2 VIEW COMPARISON:  03/12/2018 FINDINGS: LEFT subclavian transvenous pacemaker leads project over RIGHT atrium and RIGHT ventricle unchanged. Enlargement of cardiac silhouette post CABG. Calcified tortuous thoracic aorta. Pulmonary vascular congestion. Atelectasis and interstitial infiltrate at the RIGHT base unchanged. Persistent consolidation LEFT lower lobe. Persistent small pleural effusions. No pneumothorax. IMPRESSION: Persistent atelectasis versus consolidation LEFT lower lobe. Persistent RIGHT basilar atelectasis and bibasilar interstitial changes with small pleural effusions. Electronically Signed   By: Ulyses Southward M.D.   On: 03/16/2018 12:28    Procedures Procedures (including critical care time)  Medications Ordered in ED Medications  methylPREDNISolone sodium  succinate (SOLU-MEDROL) 125 mg/2 mL injection 125 mg (has no administration in time range)  cefTRIAXone (ROCEPHIN) 1 g in sodium chloride 0.9 % 100 mL IVPB (has no administration in time range)  guaiFENesin (MUCINEX) 12 hr tablet 1,200 mg (has no administration in time range)  albuterol (PROVENTIL) (2.5 MG/3ML) 0.083% nebulizer solution 2.5 mg (2.5 mg Nebulization Given 03/16/18 1134)  furosemide (LASIX) injection 40 mg (40 mg Intravenous Given 03/16/18 1421)  ipratropium-albuterol (DUONEB) 0.5-2.5 (3) MG/3ML nebulizer solution 3 mL (3 mLs Nebulization Given 03/16/18 1551)     Initial Impression / Assessment and Plan / ED Course  I have reviewed the triage vital signs and the nursing notes.  Pertinent labs & imaging results that were available during my care of the patient were reviewed by me and considered in my medical decision making (see chart for details).     84 year old male with past medical history of AAA, CAD, CHF, COPD who presents for evaluation of shortness of breath.  No chest pain.  Reports worse with exertion.  Also nonproductive cough.  No fevers, nausea/vomiting.  Seen here on 03/12/18.  For same symptoms.  Troponin elevated at the time.  Patient signed out AMA.  On initial EMS arrival, patient's O2 sats were 89% on room air.  Was placed on 2 L nasal cannula with improvement into 95% on room air.  No oxygen requirement at home.  Consider ACS etiology versus COPD exacerbation versus CHF exacerbation versus acute infectious etiology.  Plan to check basic labs, chest x-ray.  Will give breathing treatment here in the ED as patient states that that usually helps him.  BMP shows bicarb of 21.  Creatinine is 2.15.  Creatinine on 03/12/18  was 2.08.  CBC is without any significant leukocytosis or anemia.  Platelets are 120.  Troponin today is 0.21.  4 days ago it had been 0.19.  Chest x-ray shows persistent atelectasis versus consolidation in the left lower lobe.  Also persistent right basilar  atelectasis and bibasilar interstitial changes with small pleural effusions.  Discussed results with patient.  Daughter states that she was notified by nurse at the nursing home that patient had had a scan done at the [place] that was concerning for a lesion on his lung.  She states that he had been told to follow-up with an oncologist.  I discussed with pacemaker representative.  Patient's pacemaker is on MVP mode which is supposed to sense PVCs.  Once PVCs are sensed, the pacemaker should go back to dual-chamber pacing.  She states that he is having some escaped PVCs but that since they are so small, it is not being sensed by the pacemaker.  She reports that his last interrogation was on 2/2 119.  Since then he had one episode of atrial arrhythmia.  Discussed patient with Dr. Elberta Fortis (EP) regarding patient's pacemaker.  He will plan to evaluate patient in the ED and interrogate his pacemaker.    Dr. Elberta Fortis evaluated his pacemaker. Does not recommend aggressive treatment at this time. Please see his note for further detail.  Patient still with SOB. His O2 sats will occasionally drop down to 86% on RA. Patient placed on 2L O2 [place]. Given concerns of SOB, hypoxia, elevated BNP, will plan for admission.  Discussed patient with Dr. Melynda Ripple (hospitalist). Will admit.   Final Clinical Impressions(s) / ED Diagnoses   Final diagnoses:  Shortness of breath  Elevated troponin    ED Discharge Orders    None       Rosana Hoes 03/16/18 1624    Wynetta Fines, MD 03/21/18 [phone]

## 2018-03-16 NOTE — H&P (Addendum)
[facility] History and Physical  [name] VWU:[id number] DOB: [birth year] DOA: 03/16/2018  Referring physician:  PCP: [facility]   Chief Complaint: "I had trouble breathing."  HPI: [name] is a 84 y.o. male past medical history significant for CHF, COPD, CVA, hypertension hypothyroidism presents emergency room with chief complaint of shortness of breath.  Patient was recently seen by his cardiologist told him he had a mild CHF exacerbation.  Had changes made to his medication.  Patient has not been on diuretics due to issue with hypotension.  Patient continued to worsen and came to the ED today due to feeling weak.  ED Course: Patient given Lasix and albuterol.  Hospitalist consulted for admission.   Review of Systems:  As per HPI otherwise 10 point review of systems negative.    Past Medical History:  Diagnosis Date  . AAA (abdominal aortic aneurysm) (HCC)    Has a known AAA of 3.2 x 3.4 cm by abdominal untrasound 03/27/12. Followup abdominal ultrasound on 03/29/13 showed a diameter of 3.6 cm, which is stable  . Abdominal bruit    Loud  . CAD (coronary artery disease)    a. s/p MI in 1988;  b. s/p CABG in 2000 (Dr. Barry Dienes) x 4, LIMA to LAD, SVG to diagonal, SVG to [place], SVG to PDA;  c. 12/2013 NSTEMI/abnl MV;  d. 01/2014 Cath/PCI: native 3VD, VG->RCA 100, VG->OM aneurysmal 40ost/m, 88m(5.0x12 Veriflex BMS), VG->Diag aneurysmal, LIMA->LAD nl.  . Chronic systolic CHF (congestive heart failure) (HCC)    Hospitalization for CHF from 05/04/13-05/06/13. Treated medically. Had possible pneumonia with short course of antibiotics.  Marland Kitchen COPD (chronic obstructive pulmonary disease) (HCC)   . CVA (cerebral infarction) 07/2011   Remote left brain infarct with sensory deficits and had a left internal carotid stent placed by Dr. [name] at that time, no recurrent CVA or TIAs or CNS events.  . Headache   . Hyperlipidemia   . Hypertension   . Hypothyroid    On supplement "don't know if I take RX or not for my thyroid" (01/16/2014)  . Orthostatic hypotension   . PAD (peripheral artery disease) (HCC)    Stable. Had a past RFPBG by Dr. [name] in 2009. this graft is occluded with reconstitution in the popliteal artery, which was patent. Right tibial was occluded and 2-vessel runoff on angiography done by [name] in 12/2010.  Marland Kitchen Peripheral vascular disease (HCC)   . Pneumonia   . Presence of permanent cardiac pacemaker   . Sick sinus syndrome (HCC)    PPM implanted for this and syncope   . Stroke (HCC)   . Syncope    tilt table test/8.10.01/orthostatic hypotension  . TIA (transient ischemic attack)    Past Surgical History:  Procedure Laterality Date  . ABDOMINAL AORTAGRAM N/A 02/22/2012   Procedure: ABDOMINAL Ronny Flurry;  Surgeon: Nada Libman, MD;  Location: [facility] CATH LAB;  Service: Cardiovascular;  Laterality: N/A;  . abdominal aortagram    . CARDIAC CATHETERIZATION  2000  . CAROTID ANGIOGRAM Bilateral 02/22/2012   Procedure: CAROTID ANGIOGRAM;  Surgeon: Nada Libman, MD;  Location: [facility] CATH LAB;  Service: Cardiovascular;  Laterality: Bilateral;  . CAROTID STENT Left 06/23/11   By [name]  . CATARACT EXTRACTION W/ INTRAOCULAR LENS  IMPLANT, BILATERAL Bilateral   . COLONOSCOPY    . CORONARY ANGIOPLASTY WITH STENT PLACEMENT  2001-01/16/2014   "think today makes #4" (01/16/2014)  . CORONARY ARTERY BYPASS GRAFT  2000   1st in  2000 (Dr. Barry Dienes) x 4, LIMA to LAD, SVG to diagonal, SVG to OM4, SVG to PDA. Re-cath in May 2012 with patent LIMA to LAD, patent SVG to diagonal, patent SVG to OM with left-to-right collaterals to an occluded right.  Marland Kitchen ENDARTERECTOMY FEMORAL Right 06/25/2015   Procedure: ENDARTERECTOMY RIGHT FEMORAL ARTERY ;  Surgeon: Larina Earthly, MD;  Location: [facility] OR;  Service: Vascular;  Laterality: Right;  . FEMORAL BYPASS Right ?2001  . INGUINAL HERNIA REPAIR Right 1980's  . INSERT / REPLACE / REMOVE PACEMAKER     implanted 2003  by Dr Jenne Campus with gen change (MDT [id number]) 08/2010 by [facility]  . LEFT HEART CATHETERIZATION WITH CORONARY/GRAFT ANGIOGRAM N/A 01/16/2014   Procedure: LEFT HEART CATHETERIZATION WITH Isabel Caprice;  Surgeon: Kathleene Hazel, MD;  Location: [facility] CATH LAB;  Service: Cardiovascular;  Laterality: N/A;  . PATCH ANGIOPLASTY Right 06/25/2015   Procedure: RIGHT COMMON FEMORAL ARTERY AND PROFUNDA PATCH ANGIOPLASTY USING HEMASHIELD PLATINUM FINESSE PATCH ;  Surgeon: Larina Earthly, MD;  Location: [facility] OR;  Service: Vascular;  Laterality: Right;  . PERIPHERAL VASCULAR CATHETERIZATION N/A 06/24/2015   Procedure: Abdominal Aortogram;  Surgeon: Nada Libman, MD;  Location: [facility] INVASIVE CV LAB;  Service: Cardiovascular;  Laterality: N/A;   Social History:  reports that he has been smoking cigarettes.  He has a 34.00 pack-year smoking history. He has never used smokeless tobacco. He reports that he does not drink alcohol or use drugs.  Allergies  Allergen Reactions  . Tramadol Nausea And Vomiting    Other reaction(s): Other (See Comments) unknown  . Pravachol Other (See Comments)    myalgias  . Pravastatin Other (See Comments)    myalgias  . Simvastatin Other (See Comments)    myalgia  . Other Other (See Comments)    Other reaction(s): Other (See Comments) GREEN BEANS AND ONIONS GREEN BEANS AND ONIONS  . Oxycodone Other (See Comments)    Other reaction(s): Other (See Comments) Family member reports "he was highly agitated and disoriented" unknown Family member reports "he was highly agitated and disoriented"   . Terazosin Other (See Comments)    Other reaction(s): Other (See Comments) unknown unknown    Family History  Problem Relation Age of Onset  . Cancer - Other Brother        Oral  . Prostate cancer Brother   . Hypertension Brother   . Hyperlipidemia Brother   . Lung disease Father        black lung disease     Prior to Admission medications   Medication Sig Start Date End  Date Taking? Authorizing Provider  acetaminophen (TYLENOL) 325 MG tablet Take 650 mg by mouth every 6 (six) hours as needed for mild pain. Reported on 11/25/2015   Yes [provider]  amLODipine (NORVASC) 10 MG tablet Take 10 mg by mouth daily.   Yes [provider]  aspirin 81 MG tablet Take 81 mg by mouth daily.   Yes [provider]  atorvastatin (LIPITOR) 40 MG tablet Take 1 tablet (40 mg total) by mouth daily at 6 PM. 05/14/15  Yes Robbie Lis M, PA-C  isosorbide mononitrate (IMDUR) 30 MG 24 hr tablet Take 0.5 tablets (15 mg total) by mouth daily. 03/13/18 03/08/19 Yes Lars Masson, MD  metoprolol succinate (TOPROL-XL) 25 MG 24 hr tablet Take 1 tablet (25 mg total) by mouth daily. Take with or immediately following a meal. 03/13/18 03/08/19 Yes Lars Masson, MD  mirtazapine (REMERON) 15  MG tablet Take 15 mg by mouth at bedtime.   Yes [provider]  spironolactone (ALDACTONE) 25 MG tablet Take 0.5 tablets (12.5 mg total) by mouth daily. 03/13/18 03/08/19 Yes Lars MassonNelson, Katarina H, MD  Tamsulosin HCl (FLOMAX) 0.4 MG CAPS Take 0.4 mg by mouth daily after supper.    Yes [provider]  nitroGLYCERIN (NITROSTAT) 0.4 MG SL tablet Place 1 tablet (0.4 mg total) under the tongue every 5 (five) minutes as needed. For chest pain 10/27/15   Lars MassonNelson, Katarina H, MD  potassium chloride (K-DUR) 10 MEQ tablet Take 1 tablet (10 mEq total) by mouth daily. Patient not taking: Reported on 03/16/2018 02/26/16   Lars MassonNelson, Katarina H, MD   Physical Exam: Vitals:   03/16/18 1833 03/16/18 1834 03/16/18 1835 03/16/18 1836  BP:      Pulse: (!) 38 79 (!) 25 (!) 47  Resp: 18 19 (!) 22 (!) 23  Temp:      TempSrc:      SpO2: 95% 95% 98% 96%  Weight:      Height:        Wt Readings from Last 3 Encounters:  03/16/18 62.6 kg (138 lb)  03/13/18 62.9 kg (138 lb 9.6 oz)  09/19/17 62.6 kg (138 lb)    General:  Appears calm and comfortable; A&Ox3 Eyes:  PERRL, EOMI,  normal lids, iris ENT:  grossly normal hearing, lips & tongue Neck:  no LAD, masses or thyromegaly Cardiovascular:  RRR, no m/r/g. No LE edema.  Respiratory:  incr wob, tachypnea, supraclavicular retractions Abdomen:  soft, ntnd Skin:  no rash or induration seen on limited exam Musculoskeletal:  grossly normal tone BUE/BLE Psychiatric:  grossly normal mood and affect, speech fluent and appropriate Neurologic:  CN 2-12 grossly intact, moves all extremities in coordinated fashion.          Labs on Admission:  Basic Metabolic Panel: Recent Labs  Lab 03/12/18 0627 03/16/18 1138  NA 144 144  K 3.4* 3.7  CL 110 111  CO2 22 21*  GLUCOSE 91 112*  BUN 23* 19  CREATININE 2.08* 2.15*  CALCIUM 8.5* 8.6*   Liver Function Tests: No results for input(s): AST, ALT, ALKPHOS, BILITOT, PROT, ALBUMIN in the last 168 hours. No results for input(s): LIPASE, AMYLASE in the last 168 hours. No results for input(s): AMMONIA in the last 168 hours. CBC: Recent Labs  Lab 03/12/18 0627 03/16/18 1138  WBC 8.5 8.3  NEUTROABS  --  6.1  HGB 13.6 13.6  HCT 41.2 40.4  MCV 96.3 96.2  PLT 110* 120*   Cardiac Enzymes: Recent Labs  Lab 03/12/18 0627 03/16/18 1138  TROPONINI 0.19* 0.21*    BNP (last 3 results) Recent Labs    03/12/18 0627 03/16/18 1138  BNP 1,407.8* 2,630.6*    ProBNP (last 3 results) No results for input(s): PROBNP in the last 8760 hours.   Serum creatinine: 2.15 mg/dL (H) 16/09/9603/11/19 [id number] Estimated creatinine clearance: 20.6 mL/min (A)  CBG: No results for input(s): GLUCAP in the last 168 hours.  Radiological Exams on Admission: Dg Chest 2 View  Result Date: 03/16/2018 CLINICAL DATA:  Shortness of breath and cough for 2 days, history stroke, coronary artery disease post CABG, CHF, COPD, hypertension EXAM: CHEST - 2 VIEW COMPARISON:  03/12/2018 FINDINGS: LEFT subclavian transvenous pacemaker leads project over RIGHT atrium and RIGHT ventricle unchanged. Enlargement of  cardiac silhouette post CABG. Calcified tortuous thoracic aorta. Pulmonary vascular congestion. Atelectasis and interstitial infiltrate at the RIGHT base unchanged.  Persistent consolidation LEFT lower lobe. Persistent small pleural effusions. No pneumothorax. IMPRESSION: Persistent atelectasis versus consolidation LEFT lower lobe. Persistent RIGHT basilar atelectasis and bibasilar interstitial changes with small pleural effusions. Electronically Signed   By: Ulyses Southward M.D.   On: 03/16/2018 12:28    EKG: Independently reviewed. Afib with pvcs.  Assessment/Plan Principal Problem:   Acute on chronic respiratory failure with hypoxia (HCC) Active Problems:   HTN (hypertension)   Hyperlipidemia   Acute exacerbation of CHF (congestive heart failure) (HCC)  Acute Resp Failure Acute heart failure, possible COP component? Getting CXR chest to help with dx Serial troponin Diuresis bid Echo tomorrow Ins and outs Continuous pulse ox Cont BB, Aldactone Bipap prn Consult Cardiology  Hypertension When necessary hydralazine 10 mg IV as needed for severe blood pressure Cont norvasc  Hyperlipidemia Continue statin   CAD Cont imdur  Code Status:   DVT Prophylaxis:  Family Communication: dgtr Disposition Plan: Pending Improvement  Status: sdu, inpt  Haydee Salter, MD Family Medicine [facility] [email or web address] Password [email or web address]

## 2018-03-16 NOTE — ED Notes (Signed)
Pt removed BiPap stating that he is hungry and thirsty

## 2018-03-16 NOTE — ED Triage Notes (Signed)
Pt. [facility] with c/o SOB and cough. Pt RA Sat was found to be 89%, placed on 2L [place] O2 sat 95%. Pt has also had nonproductive cough.

## 2018-03-17 MED ORDER — ACETAMINOPHEN 325 MG PO TABS: 650.0000 mg | ORAL_TABLET | Freq: Four times a day (QID) | ORAL | Status: DC | PRN

## 2018-03-17 MED ORDER — HEPARIN SODIUM (PORCINE) 5000 UNIT/ML IJ SOLN
5000.0000 [IU] | Freq: Three times a day (TID) | INTRAMUSCULAR | Status: DC
  Administered 2018-03-17 – 2018-03-19 (×6): 5000 [IU] via SUBCUTANEOUS
  Filled 2018-03-17 (×6): qty 1

## 2018-03-17 MED ORDER — GUAIFENESIN-DM 100-10 MG/5ML PO SYRP: 5.0000 mL | ORAL_SOLUTION | ORAL | Status: DC | PRN

## 2018-03-17 MED ORDER — AMLODIPINE BESYLATE 5 MG PO TABS
5.0000 mg | ORAL_TABLET | Freq: Every day | ORAL | Status: DC
  Administered 2018-03-18 – 2018-03-19 (×2): 5 mg via ORAL
  Filled 2018-03-17 (×2): qty 1

## 2018-03-17 MED ORDER — BENZONATATE 100 MG PO CAPS
200.0000 mg | ORAL_CAPSULE | Freq: Three times a day (TID) | ORAL | Status: DC | PRN
  Administered 2018-03-17: 200 mg via ORAL
  Filled 2018-03-17: qty 2

## 2018-03-17 MED ORDER — IPRATROPIUM-ALBUTEROL 0.5-2.5 (3) MG/3ML IN SOLN
3.0000 mL | Freq: Three times a day (TID) | RESPIRATORY_TRACT | Status: DC
  Filled 2018-03-17: qty 3

## 2018-03-17 MED ORDER — METHYLPREDNISOLONE SODIUM SUCC 125 MG IJ SOLR
60.0000 mg | Freq: Two times a day (BID) | INTRAMUSCULAR | Status: DC
  Administered 2018-03-17 – 2018-03-18 (×2): 60 mg via INTRAVENOUS
  Filled 2018-03-17 (×2): qty 2

## 2018-03-17 NOTE — ED Notes (Signed)
ORDERED DIET TRAY FOR PT  

## 2018-03-17 NOTE — ED Notes (Signed)
Attempted report 

## 2018-03-17 NOTE — ED Notes (Signed)
Paged attending regarding diet order and about changing admission from SDU to tele (patient maintain O2 saturation on [place] since 0200 this morning.)

## 2018-03-17 NOTE — Progress Notes (Addendum)
TEAM 1 - Stepdown/ICU TEAM  Terrence Wishon  UJW:[id number] DOB: [birth year] DOA: 03/16/2018 PCP: [facility]    Brief Narrative:  84 y.o. male w/ a hx of CHF, COPD, CVA, HTN, and hypothyroidism who presented to the ED w/ shortness of breath.  He had recently been seen by his cardiologist who told him he had a mild CHF exacerbation and made changes to his meds.  Patient had not been on diuretics due to hypotension.    Significant Events: 4/11 admit   Subjective: Pt reports that he feels much better.  He states his SOB has nearly resolved.  He denies cp, n/v, or abdom pain.    Assessment & Plan:  Acute Hypoxic Resp Failure Rapidly improved - liberated from BIPAP - unclear if this was pulmonary edema or COPD exacerbation - I favor a probable COPD exac   Acute exacerbation of COPD - ongoing tobacco abuse  Maximize neb tx - cont steroid taper - follow sats - may require home O2 at this point  L retrocardiac density  Noted on CT chest - possible infiltrate - pt is afebrile w/ normal WBC - withold further abx for now and follow clinically   Chronic Diastolic CHF EF 55-60% via TTE June 2018 w/ grade 1 DD - no clinical evidence of signif volume overload at time of my exam today - baseline wgt appears to be ~138# - he is currently at his baseline wgt   Filed Weights   03/16/18 1100  Weight: 62.6 kg (138 lb)   Lung mass CT noted "enlarging nodular density in the peripheral right upper lobe measuring 14 x 9 mm" as well as "an additional new nodule in the right upper lobe measuring 4.4 mm" - pt reports he has been told of this before but that "I didn't feel bad so I didn't see a need to look into it any more" - larger of the 2 lesions is very peripheral and probably amenable to bx, though I am not sure further w/u is indicated given advanced age - will discuss w/ pt and his family further   CKD Baseline crt ~2 Recent Labs  Lab 03/12/18 0627  03/16/18 1138  CREATININE 2.08* 2.15*    Hypertension BP well controlled   Orthostatic Hypotension - frequent falls For this reason his Cardiologist has been avoiding lasix - permissive HTN to an extent   Hyperlipidemia Continue statin   CAD s/p CABG x4 2000 - DES to SVG/OM 2015 Asymptomatic   Hx of CVA w/ L ICA stent 2012  Hypothyroid  Cont home med tx   PAD S/p RFPBG 2009 > occluded  SSS s/p pacer   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 [name]   DVT prophylaxis: SQ heparin  Code Status: FULL CODE Family Communication: no family present at time of exam  Disposition Plan: tele bed - follow sats - mobilize   Consultants:  none  Antimicrobials:  none   Objective: Blood pressure 124/88, pulse 69, temperature 97.9 F (36.6 C), temperature source Oral, resp. rate 16, height 5\' 9"  (1.753 m), weight 62.6 kg (138 lb), SpO2 100 %.  Intake/Output Summary (Last 24 hours) at 03/17/2018 0824 Last data filed at 03/16/2018 1708 Gross per 24 hour  Intake 100 ml  Output -  Net 100 ml   Filed Weights   03/16/18 1100  Weight: 62.6 kg (138 lb)    Examination: General: No acute respiratory distress Lungs: Clear to auscultation  bilaterally without wheezes or crackles - distant bs th/o  Cardiovascular: Regular rate and rhythm without murmur gallop or rub normal S1 and S2 Abdomen: Nontender, nondistended, soft, bowel sounds positive, no rebound, no ascites, no appreciable mass Extremities: No significant cyanosis, clubbing, or edema bilateral lower extremities  CBC: Recent Labs  Lab 03/12/18 0627 03/16/18 1138  WBC 8.5 8.3  NEUTROABS  --  6.1  HGB 13.6 13.6  HCT 41.2 40.4  MCV 96.3 96.2  PLT 110* 120*   Basic Metabolic Panel: Recent Labs  Lab 03/12/18 0627 03/16/18 1138  NA 144 144  K 3.4* 3.7  CL 110 111  CO2 22 21*  GLUCOSE 91 112*  BUN 23* 19  CREATININE 2.08* 2.15*  CALCIUM 8.5* 8.6*   GFR: Estimated Creatinine  Clearance: 20.6 mL/min (A) (by C-G formula based on SCr of 2.15 mg/dL (H)).  Liver Function Tests: No results for input(s): AST, ALT, ALKPHOS, BILITOT, PROT, ALBUMIN in the last 168 hours. No results for input(s): LIPASE, AMYLASE in the last 168 hours. No results for input(s): AMMONIA in the last 168 hours.  Coagulation Profile: No results for input(s): INR, PROTIME in the last 168 hours.  Cardiac Enzymes: Recent Labs  Lab 03/12/18 0627 03/16/18 1138  TROPONINI 0.19* 0.21*    HbA1C: Hgb A1c MFr Bld  Date/Time Value Ref Range Status  07/05/2011 06:04 PM 6.1 (H) <5.7 % Final    Comment:    (NOTE)                                                                       According to the ADA Clinical Practice Recommendations for 2011, when HbA1c is used as a screening test:  >=6.5%   Diagnostic of Diabetes Mellitus           (if abnormal result is confirmed) 5.7-6.4%   Increased risk of developing Diabetes Mellitus References:Diagnosis and Classification of Diabetes Mellitus,Diabetes Care,2011,34(Suppl 1):S62-S69 and Standards of Medical Care in         Diabetes - 2011,Diabetes Care,2011,34 (Suppl 1):S11-S61.  06/18/2011 05:34 AM 5.9 (H) <5.7 % Final    Comment:    (NOTE)                                                                       According to the ADA Clinical Practice Recommendations for 2011, when HbA1c is used as a screening test:  >=6.5%   Diagnostic of Diabetes Mellitus           (if abnormal result is confirmed) 5.7-6.4%   Increased risk of developing Diabetes Mellitus References:Diagnosis and Classification of Diabetes Mellitus,Diabetes Care,2011,34(Suppl 1):S62-S69 and Standards of Medical Care in         Diabetes - 2011,Diabetes Care,2011,34 (Suppl 1):S11-S61.     Scheduled Meds: . amLODipine  10 mg Oral Daily  . aspirin EC  81 mg Oral Daily  . atorvastatin  40 mg Oral q1800  . guaiFENesin  1,200 mg Oral Once  .  ipratropium-albuterol  3 mL Nebulization  Q6H  . isosorbide mononitrate  15 mg Oral Daily  . methylPREDNISolone (SOLU-MEDROL) injection  125 mg Intravenous BID  . metoprolol succinate  25 mg Oral Daily  . mirtazapine  15 mg Oral QHS  . spironolactone  12.5 mg Oral Daily  . tamsulosin  0.4 mg Oral QPC supper     LOS: 1 day   Lonia Blood, MD [facility] Office  [phone] Pager - Text Page per Amion as per below:  On-Call/Text Page:      [email or web address]      password [email or web address]  If 7PM-7AM, please contact night-coverage [email or web address] Password [facility] 03/17/2018, 8:24 AM

## 2018-03-18 DIAGNOSIS — J9621 Acute and chronic respiratory failure with hypoxia: Principal | ICD-10-CM

## 2018-03-18 LAB — CBC
HCT: 33.6 % — ABNORMAL LOW (ref 39.0–52.0)
HEMOGLOBIN: 10.9 g/dL — AB (ref 13.0–17.0)
MCH: 31.1 pg (ref 26.0–34.0)
MCHC: 32.4 g/dL (ref 30.0–36.0)
MCV: 96 fL (ref 78.0–100.0)
Platelets: 121 10*3/uL — ABNORMAL LOW (ref 150–400)
RBC: 3.5 MIL/uL — ABNORMAL LOW (ref 4.22–5.81)
RDW: 15.7 % — ABNORMAL HIGH (ref 11.5–15.5)
WBC: 13.9 10*3/uL — ABNORMAL HIGH (ref 4.0–10.5)

## 2018-03-18 LAB — BASIC METABOLIC PANEL
ANION GAP: 10 (ref 5–15)
BUN: 45 mg/dL — ABNORMAL HIGH (ref 6–20)
CALCIUM: 8.1 mg/dL — AB (ref 8.9–10.3)
CHLORIDE: 108 mmol/L (ref 101–111)
CO2: 23 mmol/L (ref 22–32)
Creatinine, Ser: 2.76 mg/dL — ABNORMAL HIGH (ref 0.61–1.24)
GFR calc Af Amer: 22 mL/min — ABNORMAL LOW (ref >60)
GFR calc non Af Amer: 19 mL/min — ABNORMAL LOW (ref >60)
GLUCOSE: 148 mg/dL — AB (ref 65–99)
Potassium: 4.4 mmol/L (ref 3.5–5.1)
Sodium: 141 mmol/L (ref 135–145)

## 2018-03-18 MED ORDER — PREDNISONE 20 MG PO TABS
60.0000 mg | ORAL_TABLET | Freq: Every day | ORAL | Status: DC
  Administered 2018-03-18 – 2018-03-19 (×2): 60 mg via ORAL
  Filled 2018-03-18 (×2): qty 3

## 2018-03-18 MED ORDER — IPRATROPIUM-ALBUTEROL 0.5-2.5 (3) MG/3ML IN SOLN: 3.0000 mL | RESPIRATORY_TRACT | Status: DC | PRN

## 2018-03-18 NOTE — Progress Notes (Signed)
[name]  ZOX:[id number] DOB: 03-Feb-1927 DOA: 03/16/2018 PCP: [facility]   84 y.o. male w/ a hx of CHF, COPD, CVA, HTN, and hypothyroidism who presented to the ED w/ shortness of breath.  He had recently been seen by his cardiologist who told him he had a mild CHF exacerbation and made changes to his meds.  Patient had not been on diuretics due to hypotension.    Significant Events: 4/11 admit   Subjective: Ambulating in unit without difficulty No sob no fever no cp  Some cough  Assessment & Plan:  Acute Hypoxic Resp Failure Rapidly improved - liberated from BIPAP - unclear if this was pulmonary edema or COPD exacerbation - I favor a probable COPD exac   Acute exacerbation of COPD - ongoing tobacco abuse  Maximize neb tx - cont steroid taper, prednisone - follow sats desat screen in am  L retrocardiac density  Noted on CT chest - possible infiltrate - pt is afebrile w/ normal WBC - withold further abx for now and follow clinically   Chronic Diastolic CHF EF 55-60% via TTE June 2018 w/ grade 1 DD - no clinical evidence of signif volume overload at time of my exam today - baseline wgt appears to be ~138# - he is currently at his baseline wgt   Lung mass CT noted "enlarging nodular density in the peripheral right upper lobe measuring 14 x 9 mm" as well as "an additional new nodule in the right upper lobe measuring 4.4 mm" - pt reports he has been told of this before but that "I didn't feel bad so I didn't see a need to look into it any more" - larger of the 2 lesions is very peripheral and probably amenable to bx Family and p[atient will discuss risks and benefits of work-up as OP  CKD Baseline crt ~2  Hypertension BP well controlled   Orthostatic Hypotension - frequent falls For this reason his Cardiologist has been avoiding lasix - permissive HTN to an extent   Hyperlipidemia Continue statin   CAD s/p CABG x4 2000 - DES to SVG/OM  2015 Asymptomatic   Hx of CVA w/ L ICA stent 2012  Hypothyroid  Cont home med tx   PAD S/p RFPBG 2009 > occluded  SSS s/p pacer   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 [name] --not surgical candidate given high risk  DVT prophylaxis: SQ heparin  Code Status: FULL CODE Family Communication: d/w 2 daughters bedside  Disposition Plan: tele bed - follow sats - mobilize   Consultants:  none  Antimicrobials:  none   Objective: Blood pressure (!) 149/104, pulse 78, temperature 97.6 F (36.4 C), temperature source Oral, resp. rate (!) 22, height 5\' 9"  (1.753 m), weight 60.5 kg (133 lb 4.8 oz), SpO2 98 %.  Intake/Output Summary (Last 24 hours) at 03/18/2018 0807 Last data filed at 03/17/2018 2200 Gross per 24 hour  Intake 240 ml  Output 100 ml  Net 140 ml   Filed Weights   03/16/18 1100 03/18/18 0507  Weight: 62.6 kg (138 lb) 60.5 kg (133 lb 4.8 oz)    Examination:  Awake alert pleasant without distress EOMI ncat abd soft nt nd nno rebound no guard No le edema Bitemporalis wasting Strong gait  CBC: Recent Labs  Lab 03/12/18 0627 03/16/18 1138 03/18/18 0412  WBC 8.5 8.3 13.9*  NEUTROABS  --  6.1  --   HGB 13.6 13.6 10.9*  HCT 41.2  40.4 33.6*  MCV 96.3 96.2 96.0  PLT 110* 120* 121*   Basic Metabolic Panel: Recent Labs  Lab 03/12/18 0627 03/16/18 1138 03/18/18 0412  NA 144 144 141  K 3.4* 3.7 4.4  CL 110 111 108  CO2 22 21* 23  GLUCOSE 91 112* 148*  BUN 23* 19 45*  CREATININE 2.08* 2.15* 2.76*  CALCIUM 8.5* 8.6* 8.1*   GFR: Estimated Creatinine Clearance: 15.5 mL/min (A) (by C-G formula based on SCr of 2.76 mg/dL (H)).  Liver Function Tests: No results for input(s): AST, ALT, ALKPHOS, BILITOT, PROT, ALBUMIN in the last 168 hours. No results for input(s): LIPASE, AMYLASE in the last 168 hours. No results for input(s): AMMONIA in the last 168 hours.  Coagulation Profile: No results for input(s): INR, PROTIME in the  last 168 hours.  Cardiac Enzymes: Recent Labs  Lab 03/12/18 0627 03/16/18 1138  TROPONINI 0.19* 0.21*    HbA1C: Hgb A1c MFr Bld  Date/Time Value Ref Range Status  07/05/2011 06:04 PM 6.1 (H) <5.7 % Final    Comment:    (NOTE)                                                                       According to the ADA Clinical Practice Recommendations for 2011, when HbA1c is used as a screening test:  >=6.5%   Diagnostic of Diabetes Mellitus           (if abnormal result is confirmed) 5.7-6.4%   Increased risk of developing Diabetes Mellitus References:Diagnosis and Classification of Diabetes Mellitus,Diabetes Care,2011,34(Suppl 1):S62-S69 and Standards of Medical Care in         Diabetes - 2011,Diabetes Care,2011,34 (Suppl 1):S11-S61.  06/18/2011 05:34 AM 5.9 (H) <5.7 % Final    Comment:    (NOTE)                                                                       According to the ADA Clinical Practice Recommendations for 2011, when HbA1c is used as a screening test:  >=6.5%   Diagnostic of Diabetes Mellitus           (if abnormal result is confirmed) 5.7-6.4%   Increased risk of developing Diabetes Mellitus References:Diagnosis and Classification of Diabetes Mellitus,Diabetes Care,2011,34(Suppl 1):S62-S69 and Standards of Medical Care in         Diabetes - 2011,Diabetes Care,2011,34 (Suppl 1):S11-S61.     Scheduled Meds: . amLODipine  5 mg Oral Daily  . aspirin EC  81 mg Oral Daily  . atorvastatin  40 mg Oral q1800  . heparin injection (subcutaneous)  5,000 Units Subcutaneous Q8H  . ipratropium-albuterol  3 mL Nebulization TID  . isosorbide mononitrate  15 mg Oral Daily  . methylPREDNISolone (SOLU-MEDROL) injection  60 mg Intravenous Q12H  . metoprolol succinate  25 mg Oral Daily  . mirtazapine  15 mg Oral QHS  . spironolactone  12.5 mg Oral Daily  . tamsulosin  0.4 mg Oral QPC supper  LOS: 2 days   Pleas Koch, MD [facility] [phone]   On-Call/Text Page:      [email or web address]      password [email or web address]  If 7PM-7AM, please contact night-coverage [email or web address] Password [email or web address] 03/18/2018, 8:07 AM

## 2018-03-18 NOTE — Progress Notes (Addendum)
Pt IV access removed this Am  Due to leaking blood at site. Dr. Mahala MenghiniSamtani present and aware of no IV access, will not restart. Ok for pt to have no IV access.    SATURATION QUALIFICATIONS: (This note is used to comply with regulatory documentation for home oxygen)  Patient Saturations on Room Air at Rest = 93 %  Patient Saturations on Room Air while Ambulating = 97 %  Patient Saturations on 2 Liters of oxygen while Ambulating = 100 %  Please briefly explain why patient needs home oxygen:

## 2018-03-18 NOTE — Evaluation (Addendum)
Occupational Therapy Evaluation Patient Details Name: [name] MRN: [id number] DOB: 1927-05-09 Today's Date: 03/18/2018    History of Present Illness Pt is an 84 y.o. male with history of CHF, COPD, CVA, HTN, and hypothyroidism who presented to the ED with shortness of breath. Admitted with acute exacerbation of COPD. Pt recently with mild CHF exacerbation diagnosed by cardiologist.    Clinical Impression   PTA, pt reports independence with ADL and functional mobility. He currently requires overall supervision for safety during ADL tasks due to impulsivity and fair dynamic standing balance this session. Pt did demonstrate desaturation during bathing tasks to 86% SpO2 on RA and re-applied 1L supplemental O2 with improvement to 94%. Pt with decreased awareness of safety and decreased attention throughout session. He did become agitated with OT because he wanted recliner chair unlocked so that he could roll around room and OT educated pt that it is unsafe to leave the wheels to the chair unlocked when he is seated in the chair. Pt would benefit from continued OT services while admitted to improve independence and safety with ADL and functional mobility. Pt lives with his daughter and recommend supervision for safety during ADL initially post-acute D/C. OT will continue to follow while admitted.     Follow Up Recommendations  Supervision/Assistance - 24 hour    Equipment Recommendations  None recommended by OT    Recommendations for Other Services PT consult     Precautions / Restrictions Precautions Precautions: Fall Precaution Comments: Pt slightly impulsive. Restrictions Weight Bearing Restrictions: No      Mobility Bed Mobility Overal bed mobility: Needs Assistance Bed Mobility: Supine to Sit     Supine to sit: Supervision     General bed mobility comments: Supervision for safety with cues to complete safely. Pt agitated with wires and OT thoroughly explained their  necessity.   Transfers Overall transfer level: Needs assistance   Transfers: Sit to/from Stand Sit to Stand: Supervision         General transfer comment: Close supervision for safety as pt very impulsive with little awareness of deficits.     Balance Overall balance assessment: Needs assistance Sitting-balance support: No upper extremity supported;Feet supported Sitting balance-Leahy Scale: Fair     Standing balance support: Bilateral upper extremity supported;No upper extremity supported;During functional activity Standing balance-Leahy Scale: Fair Standing balance comment: Supervision during dynamic tasks.                            ADL either performed or assessed with clinical judgement   ADL Overall ADL's : Needs assistance/impaired Eating/Feeding: Set up;Sitting   Grooming: Set up;Sitting   Upper Body Bathing: Set up;Sitting   Lower Body Bathing: Supervison/ safety;Sit to/from stand   Upper Body Dressing : Set up;Sitting   Lower Body Dressing: Supervision/safety;Sit to/from stand   Toilet Transfer: Supervision/safety;Ambulation Toilet Transfer Details (indicate cue type and reason): Short-distance ambulation this session.  Toileting- Clothing Manipulation and Hygiene: Supervision/safety;Sit to/from stand       Functional mobility during ADLs: Supervision/safety General ADL Comments: Pt requiring very close supervision throughout all ADL due to impulsivity as well as decreased awareness of safety. Pt on 1L supplemental O2 at rest with O2 saturation 99-100%. Removed supplemental O2 for bathing tasks at EOB with O2 desaturation to 86%. Returned 1L O2 and pt with improved SpO2 to 94%. Notified MD.      Vision Patient Visual Report: No change from baseline Vision Assessment?: No apparent  visual deficits     Perception     Praxis      Pertinent Vitals/Pain Pain Assessment: Faces Faces Pain Scale: No hurt     Hand Dominance Right    Extremity/Trunk Assessment Upper Extremity Assessment Upper Extremity Assessment: Generalized weakness   Lower Extremity Assessment Lower Extremity Assessment: Generalized weakness       Communication Communication Communication: (slightly difficult to understand at times)   Cognition Arousal/Alertness: Awake/alert Behavior During Therapy: Impulsive;Agitated Overall Cognitive Status: No family/caregiver present to determine baseline cognitive functioning Area of Impairment: Safety/judgement;Problem solving;Attention                   Current Attention Level: Selective     Safety/Judgement: Decreased awareness of deficits;Decreased awareness of safety   Problem Solving: Slow processing General Comments: Pt with difficulty problem solving throughout tasks and speaking tangentially. He is highly impulsive and unsafe. Pt becoming angry with OT for locking wheels of recliner chair despite education that this is a safety precaution (pt wanting to self-propel recliner chair in room). Also upset with OT for asking pt to don slipper socks for safety. Pt unaware of lines and unable to safely navigate around them. Pt pointing finger in OT's face and tapping OT's leg throughout and did stop when asked politely.    General Comments  See general ADL comments for SpO2 data.     Exercises     Shoulder Instructions      Home Living Family/patient expects to be discharged to:: Private residence Living Arrangements: Children Available Help at Discharge: Family;Available PRN/intermittently Type of Home: House Home Access: Stairs to enter Enter[employer] CorporationEntrance Stairs-Number of Steps: 2 Entrance Stairs-Rails: None Home Layout: One level     Bathroom Shower/Tub: Chief Strategy OfficerTub/shower unit   Bathroom Toilet: Handicapped height                Prior Functioning/Environment Level of Independence: Independent        Comments: Reports independence and working on fixing cars.         OT Problem  List: Decreased activity tolerance;Decreased strength;Decreased range of motion;Impaired balance (sitting and/or standing);Decreased safety awareness;Decreased knowledge of use of DME or AE;Decreased cognition;Decreased knowledge of precautions      OT Treatment/Interventions: Self-care/ADL training;Therapeutic exercise;Energy conservation;DME and/or AE instruction;Therapeutic activities;Patient/family education;Balance training;Cognitive remediation/compensation    OT Goals(Current goals can be found in the care plan section) Acute Rehab OT Goals Patient Stated Goal: to have his recliner chair freely rolling around room OT Goal Formulation: With patient Time For Goal Achievement: 04/01/18 Potential to Achieve Goals: Good ADL Goals Pt Will Perform Grooming: with modified independence;standing Pt Will Perform Lower Body Bathing: with modified independence;sit to/from stand Pt Will Perform Lower Body Dressing: with modified independence;sit to/from stand Pt Will Transfer to Toilet: with modified independence;ambulating;regular height toilet Pt Will Perform Toileting - Clothing Manipulation and hygiene: with modified independence;sit to/from stand  OT Frequency: Min 2X/week   Barriers to D/C:            Co-evaluation              AM-PAC PT "6 Clicks" Daily Activity     Outcome Measure Help from another person eating meals?: None Help from another person taking care of personal grooming?: A Little Help from another person toileting, which includes using toliet, bedpan, or urinal?: A Little Help from another person bathing (including washing, rinsing, drying)?: A Little Help from another person to put on and taking off regular upper body clothing?:  A Little Help from another person to put on and taking off regular lower body clothing?: A Little 6 Click Score: 19   End of Session Equipment Utilized During Treatment: Oxygen Nurse Communication: Mobility status;Other (comment)(chair  alarm set but no cord to connect to call system)  Activity Tolerance: Patient tolerated treatment well Patient left: in chair;with call bell/phone within reach;with chair alarm set  OT Visit Diagnosis: Other abnormalities of gait and mobility (R26.89);Other symptoms and signs involving cognitive function                Time: 1055-1130 OT Time Calculation (min): 35 min Charges:  OT General Charges $OT Visit: 1 Visit OT Evaluation $OT Eval Moderate Complexity: 1 Mod OT Treatments $Self Care/Home Management : 8-22 mins G-Codes:     Doristine Section, MS OTR/L  Pager: [phone]   Tannon Peerson A Khale Nigh 03/18/2018, 12:04 PM

## 2018-03-19 LAB — BASIC METABOLIC PANEL
ANION GAP: 10 (ref 5–15)
BUN: 57 mg/dL — ABNORMAL HIGH (ref 6–20)
CO2: 23 mmol/L (ref 22–32)
Calcium: 8.1 mg/dL — ABNORMAL LOW (ref 8.9–10.3)
Chloride: 107 mmol/L (ref 101–111)
Creatinine, Ser: 2.86 mg/dL — ABNORMAL HIGH (ref 0.61–1.24)
GFR calc Af Amer: 21 mL/min — ABNORMAL LOW (ref >60)
GFR calc non Af Amer: 18 mL/min — ABNORMAL LOW (ref >60)
GLUCOSE: 126 mg/dL — AB (ref 65–99)
POTASSIUM: 3.7 mmol/L (ref 3.5–5.1)
Sodium: 140 mmol/L (ref 135–145)

## 2018-03-19 MED ORDER — PREDNISONE 20 MG PO TABS: 60.0000 mg | ORAL_TABLET | Freq: Every day | ORAL | 0 refills | Status: DC

## 2018-03-19 MED ORDER — BENZONATATE 200 MG PO CAPS: 200.0000 mg | ORAL_CAPSULE | Freq: Three times a day (TID) | ORAL | 0 refills | Status: AC | PRN

## 2018-03-19 NOTE — Evaluation (Signed)
Physical Therapy Evaluation Patient Details Name: [name] MRN: [id number] DOB: 1927/09/20 Today's Date: 03/19/2018   History of Present Illness  Pt is an 84 y.o. male with history of CHF, COPD, CVA, HTN, and hypothyroidism who presented to the ED with shortness of breath. Admitted with acute exacerbation of COPD. Pt recently with mild CHF exacerbation diagnosed by cardiologist.     Clinical Impression  Pt presents at/near baseline functional level.  No mobility concerns, no overt balance deficits, and no indication for additional DME or PT follow up.  Pending d/c, pt ready and motivated to return home.  PT will sign off.     Follow Up Recommendations No PT follow up    Equipment Recommendations  None recommended by PT    Recommendations for Other Services       Precautions / Restrictions        Mobility  Bed Mobility Overal bed mobility: Independent Bed Mobility: Supine to Sit     Supine to sit: Independent     General bed mobility comments: no difficulty this eval with bed mobility  Transfers Overall transfer level: Independent Equipment used: None Transfers: Sit to/from Stand Sit to Stand: Independent         General transfer comment: no issue, stand without difficulty and steady upon rising  Ambulation/Gait Ambulation/Gait assistance: Independent Ambulation Distance (Feet): 75 Feet Assistive device: None Gait Pattern/deviations: WFL(Within Functional Limits)   Gait velocity interpretation: 1.31 - 2.62 ft/sec, indicative of limited community ambulator General Gait Details: no issues noted with gait, no instability or LOB, and no significant concerns about velocity; appropriate for age and PLOF  Stairs Stairs: Yes Stairs assistance: Modified independent (Device/Increase time) Stair Management: One rail Right;Forwards Number of Stairs: 2 General stair comments: no issues or limitations, steady up and down  Wheelchair Mobility    Modified Rankin  (Stroke Patients Only)       Balance Overall balance assessment: Independent Sitting-balance support: No upper extremity supported;Feet supported Sitting balance-Leahy Scale: Normal       Standing balance-Leahy Scale: Good                               Pertinent Vitals/Pain Pain Assessment: No/denies pain    Home Living Family/patient expects to be discharged to:: Private residence Living Arrangements: Children Available Help at Discharge: Family;Available PRN/intermittently Type of Home: House Home Access: Stairs to enter Entrance Stairs-Rails: None Entrance Stairs-Number of Steps: 2 Home Layout: One level Home Equipment: Walker - 4 wheels;Cane - single point;Shower seat;Bedside commode Additional Comments: pt doesn't use any DME at home    Prior Function Level of Independence: Independent         Comments: Reports independence and working on fixing cars.      Hand Dominance   Dominant Hand: Right    Extremity/Trunk Assessment   Upper Extremity Assessment Upper Extremity Assessment: Defer to OT evaluation    Lower Extremity Assessment Lower Extremity Assessment: Overall WFL for tasks assessed    Cervical / Trunk Assessment Cervical / Trunk Assessment: Normal  Communication   Communication: No difficulties(slightly difficulty to understand)  Cognition Arousal/Alertness: Awake/alert Behavior During Therapy: WFL for tasks assessed/performed Overall Cognitive Status: Within Functional Limits for tasks assessed                                 General Comments: No issues present today  vs. OT note of previous day.  Pt appropriate, focused on going home today/getting dressed, but willing to work with PT      General Comments      Exercises     Assessment/Plan    PT Assessment Patent does not need any further PT services  PT Problem List         PT Treatment Interventions      PT Goals (Current goals can be found in the  Care Plan section)  Acute Rehab PT Goals Patient Stated Goal: go home PT Goal Formulation: All assessment and education complete, DC therapy    Frequency     Barriers to discharge        Co-evaluation               AM-PAC PT "6 Clicks" Daily Activity  Outcome Measure Difficulty turning over in bed (including adjusting bedclothes, sheets and blankets)?: None Difficulty moving from lying on back to sitting on the side of the bed? : None Difficulty sitting down on and standing up from a chair with arms (e.g., wheelchair, bedside commode, etc,.)?: None Help needed moving to and from a bed to chair (including a wheelchair)?: None Help needed walking in hospital room?: None Help needed climbing 3-5 steps with a railing? : A Little 6 Click Score: 23    End of Session   Activity Tolerance: Patient tolerated treatment well Patient left: in chair Nurse Communication: Mobility status PT Visit Diagnosis: Difficulty in walking, not elsewhere classified (R26.2)    Time: 5409-8119 PT Time Calculation (min) (ACUTE ONLY): 17 min   Charges:   PT Evaluation $PT Eval Low Complexity: 1 Low     PT G Codes:       Narda Amber, PT, DPT, MS Board Certified Geriatric Clinical Specialist  Dennis Bast 03/19/2018, 12:46 PM

## 2018-03-19 NOTE — Progress Notes (Signed)
IR consulted for possible lung mass biopsy.  Patient with COPD and ongoing tobacco use, now admitted with suspected COPD exacerbation.  CT Chest 03/16/18: 1. It is difficult to accurately measure the descending thoracic aortic aneurysm today for the reasons discussed above. The measurements are similar in the interval measuring 5.6 cm today versus 5.5 cm previously. If there is concern, recommend a contrast enhanced study for more accurate measurement. Recommend continued close follow-up if the patient has no acute symptoms today. 2. There is an enlarging nodular density in the peripheral right upper lobe measuring 14 x 9 mm today. While nonspecific, neoplasm must be considered. There is an additional new nodule in the right upper lobe measuring 4.4 mm. 3. Lymphadenopathy in the mediastinum. Several nodes are larger in the interval as above. Given the increasing nodularity in the right upper lobe, these nodes could be neoplastic. Reactive nodes are possible. 4. Increasing left retrocardiac opacity may represent atelectasis or infiltrate. Recommend clinical correlation. 5. Severe emphysematous changes in the lungs. 6. Atherosclerotic changes in the thoracic aorta. Coronary artery calcifications. 7. Small pleural effusions, larger in the interval.  Per [facility] MD note, patient is aware of the presence of the lung nodules, however has declined work-up in the past.  Discussed case with Dr. Lowella DandyHenn. Patient is at high risk for a lung biopsy.  Given COPD exacerbation would not pursue inpatient biopsy.  Given his extensive lung disease and high risk for procedural complication even after acute illness resolved, would recommend an outpatient PET if the patient decided he wanted further work-up.   Louis DysKacie Ayeza Therriault, MS RD PA-C 11:27 AM

## 2018-03-19 NOTE — Discharge Summary (Signed)
Physician Discharge Summary  [name] ZOX:[id number] DOB: [birth year] DOA: 03/16/2018  PCP: [facility]  Admit date: 03/16/2018 Discharge date: 03/19/2018  Time spent: 25 minutes  Recommendations for Outpatient Follow-up:  1. Outpatient referral made to pulmonology in [place]-will need appointment in 1-2 weeks as well as referral to probably oncology and outpatient PET scan to workup right lung mass, probably cancer given smoking history 2. Given new prescriptions-prednisone, Tessalon 3. Get CBC plus basic metabolic panel 1 week 4. Did not require home health therapy on discharge   Discharge Diagnoses:  Principal Problem:   Acute on chronic respiratory failure with hypoxia (HCC) Active Problems:   HTN (hypertension)   Hyperlipidemia   CHF (congestive heart failure) (HCC)   Acute exacerbation of CHF (congestive heart failure) (HCC)   Acute respiratory failure (HCC)   Discharge Condition: Fair  Diet recommendation: Heart healthy low-salt  Filed Weights   03/16/18 1100 03/18/18 0507 03/19/18 0422  Weight: 62.6 kg (138 lb) 60.5 kg (133 lb 4.8 oz) 60.5 kg (133 lb 4.8 oz)    84 y.o. male w/ a hx of CHF, COPD, CVA, HTN, and hypothyroidism who presented to the ED w/ shortness of breath.  He had recently been seen by his cardiologist who told him he had a mild CHF exacerbation and made changes to his meds.  Patient had not been on diuretics due to hypotension.    Significant Events: 4/11 admit   Subjective:  No sob no fever   Assessment & Plan:  Acute Hypoxic Resp Failure Rapidly improved - liberated from BIPAP - unclear if this was pulmonary edema or COPD exacerbation  on discharge given prednisone taper and Tessalon  Acute exacerbation of COPD - ongoing tobacco abuse  Unlikely will quit-has been smoking since age 85  L retrocardiac density  Noted on CT chest - possible infiltrate - pt is afebrile w/ normal WBC -likely not infectious  but cancer related  Chronic Diastolic CHF EF 55-60% via TTE June 2018 w/ grade 1 DD - no clinical evidence of signif volume overload at time of my exam today - baseline wgt appears to be ~138# - he is currently at his baseline wgt   Lung mass CT noted "enlarging nodular density in the peripheral right upper lobe measuring 14 x 9 mm" as well as "an additional new nodule in the right upper lobe measuring 4.4 mm"  -Interventional radiology consulted-felt high risk for procedure-we will need outpatient coordination of care as above  CKD Baseline crt ~2  Hypertension BP well controlled   Orthostatic Hypotension - frequent falls For this reason his Cardiologist has been avoiding lasix - permissive HTN to an extent   Hyperlipidemia Continue statin   CAD s/p CABG x4 2000 - DES to SVG/OM 2015 Asymptomatic   Hx of CVA w/ L ICA stent 2012  Hypothyroid  Cont home med tx   PAD S/p RFPBG 2009 > occluded  SSS s/p pacer   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 [name] --not surgical candidate given high risk    Discharge Exam: Vitals:   03/18/18 2013 03/19/18 0424  BP: (!) 141/90 (!) 185/87  Pulse: 80 97  Resp: 20 (!) 22  Temp: 98.2 F (36.8 C) 98.2 F (36.8 C)  SpO2: 96% 96%    General: Awake alert oriented pleasant no distress arcus senilis by temporalis wasting supraclavicular wasting chest is clear with dullness posterior old laterally right side no rales no rhonchi Abdomen is  scaphoid No lower extremity edema Patient is however strong able to sit himself up and ambulate independently No motor deficit nor sensory deficit Poor dentition  Discharge Instructions   Discharge Instructions    Diet - low sodium heart healthy   Complete by:  As directed    Discharge instructions   Complete by:  As directed    Patient will need referral to oncology/pulmonology and he will need to be discussed at tumor board with regards to need for further  workup of his right-sided lung mass-he was deemed very high risk for any type of procedure in the hospital because of his COPD He will have prednisone 5 days worth prescribed to him and sent to his pharmacy in addition to a cough medicine I would suggest outpatient discussion with his regular physician at family medicine regarding goals of his treatment   Increase activity slowly   Complete by:  As directed      Allergies as of 03/19/2018      Reactions   Tramadol Nausea And Vomiting   Other reaction(s): Other (See Comments) unknown   Pravachol Other (See Comments)   myalgias   Pravastatin Other (See Comments)   myalgias   Simvastatin Other (See Comments)   myalgia   Other Other (See Comments)   Other reaction(s): Other (See Comments) GREEN BEANS AND ONIONS GREEN BEANS AND ONIONS   Oxycodone Other (See Comments)   Other reaction(s): Other (See Comments) Family member reports "he was highly agitated and disoriented" unknown Family member reports "he was highly agitated and disoriented"   Terazosin Other (See Comments)   Other reaction(s): Other (See Comments) unknown unknown      Medication List    TAKE these medications   acetaminophen 325 MG tablet Commonly known as:  TYLENOL Take 650 mg by mouth every 6 (six) hours as needed for mild pain. Reported on 11/25/2015   amLODipine 10 MG tablet Commonly known as:  NORVASC Take 10 mg by mouth daily.   aspirin 81 MG tablet Take 81 mg by mouth daily.   atorvastatin 40 MG tablet Commonly known as:  LIPITOR Take 1 tablet (40 mg total) by mouth daily at 6 PM.   benzonatate 200 MG capsule Commonly known as:  TESSALON Take 1 capsule (200 mg total) by mouth 3 (three) times daily as needed (refractory cough).   isosorbide mononitrate 30 MG 24 hr tablet Commonly known as:  IMDUR Take 0.5 tablets (15 mg total) by mouth daily.   metoprolol succinate 25 MG 24 hr tablet Commonly known as:  TOPROL-XL Take 1 tablet (25 mg total)  by mouth daily. Take with or immediately following a meal.   mirtazapine 15 MG tablet Commonly known as:  REMERON Take 15 mg by mouth at bedtime.   nitroGLYCERIN 0.4 MG SL tablet Commonly known as:  NITROSTAT Place 1 tablet (0.4 mg total) under the tongue every 5 (five) minutes as needed. For chest pain   potassium chloride 10 MEQ tablet Commonly known as:  K-DUR Take 1 tablet (10 mEq total) by mouth daily.   predniSONE 20 MG tablet Commonly known as:  DELTASONE Take 3 tablets (60 mg total) by mouth daily before breakfast. Start taking on:  03/20/2018   spironolactone 25 MG tablet Commonly known as:  ALDACTONE Take 0.5 tablets (12.5 mg total) by mouth daily.   tamsulosin 0.4 MG Caps capsule Commonly known as:  FLOMAX Take 0.4 mg by mouth daily after supper.      Allergies  Allergen Reactions  . Tramadol Nausea And Vomiting    Other reaction(s): Other (See Comments) unknown  . Pravachol Other (See Comments)    myalgias  . Pravastatin Other (See Comments)    myalgias  . Simvastatin Other (See Comments)    myalgia  . Other Other (See Comments)    Other reaction(s): Other (See Comments) GREEN BEANS AND ONIONS GREEN BEANS AND ONIONS  . Oxycodone Other (See Comments)    Other reaction(s): Other (See Comments) Family member reports "he was highly agitated and disoriented" unknown Family member reports "he was highly agitated and disoriented"   . Terazosin Other (See Comments)    Other reaction(s): Other (See Comments) unknown unknown      The results of significant diagnostics from this hospitalization (including imaging, microbiology, ancillary and laboratory) are listed below for reference.    Significant Diagnostic Studies: Dg Chest 2 View  Result Date: 03/16/2018 CLINICAL DATA:  Shortness of breath and cough for 2 days, history stroke, coronary artery disease post CABG, CHF, COPD, hypertension EXAM: CHEST - 2 VIEW COMPARISON:  03/12/2018 FINDINGS: LEFT  subclavian transvenous pacemaker leads project over RIGHT atrium and RIGHT ventricle unchanged. Enlargement of cardiac silhouette post CABG. Calcified tortuous thoracic aorta. Pulmonary vascular congestion. Atelectasis and interstitial infiltrate at the RIGHT base unchanged. Persistent consolidation LEFT lower lobe. Persistent small pleural effusions. No pneumothorax. IMPRESSION: Persistent atelectasis versus consolidation LEFT lower lobe. Persistent RIGHT basilar atelectasis and bibasilar interstitial changes with small pleural effusions. Electronically Signed   By: Ulyses SouthwardMark  Boles M.D.   On: 03/16/2018 12:28   Dg Chest 2 View  Result Date: 03/12/2018 CLINICAL DATA:  Short of breath EXAM: CHEST - 2 VIEW COMPARISON:  05/31/2017 FINDINGS: Thoracic aortic aneurysm unchanged. Prior CABG. Negative for heart failure. Small left effusion and left lower lobe airspace disease has progressed since the prior study. Mild right lower lobe airspace disease is unchanged and may be chronic fibrosis. Underlying COPD.  Pacemaker unchanged. IMPRESSION: Left lower lobe atelectasis/infiltrate and small left effusion have progressed mildly since the prior study. COPD and probable scarring right lung base. Negative for heart failure. Thoracic aortic aneurysm unchanged. Electronically Signed   By: Marlan Palauharles  Clark M.D.   On: 03/12/2018 07:11   Ct Chest Wo Contrast  Result Date: 03/16/2018 CLINICAL DATA:  Shortness of breath and cough. EXAM: CT CHEST WITHOUT CONTRAST TECHNIQUE: Multidetector CT imaging of the chest was performed following the standard protocol without IV contrast. COMPARISON:  Chest x-ray March 16, 2018 and chest CT January 19, 2017 FINDINGS: Cardiovascular: The heart is unchanged. Cardiomegaly. Dense coronary artery calcifications identified. The ascending thoracic aorta and aortic arch are normal. It is difficult to measure the descending thoracic aortic aneurysm on today's study due to lack of contrast. Also, the  increasing opacity in the retrocardiac region is inseparable from the lateral wall of the aorta making accurate measurement challenging. The maximum diameter today is approximately 5.6 cm versus 5.5 cm previously. Mediastinum/Nodes: A lymph node anterior to the right side of the trachea on series 4, image 64 measures 2.4 cm in short axis today versus 1.6 cm previously. Increased fullness in the subcarinal region could also represent a prominent enlarged node. Prominent nodes in the AP window are stable. Nodes anterior to the trachea on series 4, image 53 are larger in the interval. There are small pleural effusions which are larger in the interval. No pericardial effusion. The central pulmonary artery is unchanged. Lungs/Pleura: Central airways are normal. Emphysematous changes are again seen  in the lungs. No pneumothorax. Mild opacity in the lateral right apex on series 8, image 44 is favored to represent atelectasis, new in the interval. There is a peripheral nodular opacity in the right anterior lung on series 8, image 67 measuring 14 by 9 mm today versus 9 by 6 mm previously, larger in the interval. There is also a new nodule in the right lateral lung on series 8, image 66 measuring 4.4 mm. No other suspicious nodules. No overt edema. Increasing left retrocardiac opacity could represent atelectasis or infiltrate. Upper Abdomen: No interval change in the upper abdomen. Musculoskeletal: No chest wall mass or suspicious bone lesions identified. IMPRESSION: 1. It is difficult to accurately measure the descending thoracic aortic aneurysm today for the reasons discussed above. The measurements are similar in the interval measuring 5.6 cm today versus 5.5 cm previously. If there is concern, recommend a contrast enhanced study for more accurate measurement. Recommend continued close follow-up if the patient has no acute symptoms today. 2. There is an enlarging nodular density in the peripheral right upper lobe measuring  14 x 9 mm today. While nonspecific, neoplasm must be considered. There is an additional new nodule in the right upper lobe measuring 4.4 mm. 3. Lymphadenopathy in the mediastinum. Several nodes are larger in the interval as above. Given the increasing nodularity in the right upper lobe, these nodes could be neoplastic. Reactive nodes are possible. 4. Increasing left retrocardiac opacity may represent atelectasis or infiltrate. Recommend clinical correlation. 5. Severe emphysematous changes in the lungs. 6. Atherosclerotic changes in the thoracic aorta. Coronary artery calcifications. 7. Small pleural effusions, larger in the interval. Aortic Atherosclerosis (ICD10-I70.0) and Emphysema (ICD10-J43.9). Electronically Signed   By: Gerome Sam III M.D   On: 03/16/2018 20:55    Microbiology: No results found for this or any previous visit (from the past 240 hour(s)).   Labs: Basic Metabolic Panel: Recent Labs  Lab 03/16/18 1138 03/18/18 0412 03/19/18 0912  NA 144 141 140  K 3.7 4.4 3.7  CL 111 108 107  CO2 21* 23 23  GLUCOSE 112* 148* 126*  BUN 19 45* 57*  CREATININE 2.15* 2.76* 2.86*  CALCIUM 8.6* 8.1* 8.1*   Liver Function Tests: No results for input(s): AST, ALT, ALKPHOS, BILITOT, PROT, ALBUMIN in the last 168 hours. No results for input(s): LIPASE, AMYLASE in the last 168 hours. No results for input(s): AMMONIA in the last 168 hours. CBC: Recent Labs  Lab 03/16/18 1138 03/18/18 0412  WBC 8.3 13.9*  NEUTROABS 6.1  --   HGB 13.6 10.9*  HCT 40.4 33.6*  MCV 96.2 96.0  PLT 120* 121*   Cardiac Enzymes: Recent Labs  Lab 03/16/18 1138  TROPONINI 0.21*   BNP: BNP (last 3 results) Recent Labs    03/12/18 0627 03/16/18 1138  BNP 1,407.8* 2,630.6*    ProBNP (last 3 results) No results for input(s): PROBNP in the last 8760 hours.  CBG: No results for input(s): GLUCAP in the last 168 hours.     Signed:  Rhetta Mura MD   [facility] 03/19/2018, 11:34  AM

## 2018-03-19 NOTE — Progress Notes (Signed)
Pt discharged in stable condition to private vehicle with son. No further questions. Pt is aware to make follow up with Pulmonary and Oncology.

## 2018-03-19 NOTE — Care Management Note (Signed)
Case Management Note  Patient Details  Name: [name] MRN: [id number] Date of Birth: 13-Sep-1927  Subjective/Objective:                 Patient fromhome lives with daughter. He states he has a [facility] nurse that comes to check on him twice a week. Patient has DME RW, shower seat. Patient states that he still drives to appointments sometimes. Patient states no difficulties getting medication. He gets his meds at [facility]. No further CM needs.   Action/Plan:   Expected Discharge Date:  03/19/18               Expected Discharge Plan:  Home/Self Care  In-House Referral:     Discharge planning Services  CM Consult  Post Acute Care Choice:    Choice offered to:     DME Arranged:    DME Agency:     HH Arranged:    HH Agency:     Status of Service:  Completed, signed off  If discussed at [employer] Length of Stay Meetings, dates discussed:    Additional Comments:  Lawerance SabalDebbie Maleiyah Releford, RN 03/19/2018, 11:39 AM

## 2018-03-21 ENCOUNTER — Ambulatory Visit: Payer: Medicare Other | Admitting: Physician Assistant

## 2018-03-21 ENCOUNTER — Encounter: Payer: Self-pay | Admitting: Physician Assistant

## 2018-03-21 VITALS — BP 166/118 | HR 79 | Ht 69.0 in | Wt 140.8 lb

## 2018-03-21 DIAGNOSIS — R911 Solitary pulmonary nodule: Secondary | ICD-10-CM | POA: Diagnosis not present

## 2018-03-21 DIAGNOSIS — I25119 Atherosclerotic heart disease of native coronary artery with unspecified angina pectoris: Secondary | ICD-10-CM | POA: Diagnosis not present

## 2018-03-21 DIAGNOSIS — I712 Thoracic aortic aneurysm, without rupture, unspecified: Secondary | ICD-10-CM

## 2018-03-21 DIAGNOSIS — N184 Chronic kidney disease, stage 4 (severe): Secondary | ICD-10-CM | POA: Diagnosis not present

## 2018-03-21 DIAGNOSIS — D649 Anemia, unspecified: Secondary | ICD-10-CM

## 2018-03-21 DIAGNOSIS — I5032 Chronic diastolic (congestive) heart failure: Secondary | ICD-10-CM

## 2018-03-21 DIAGNOSIS — I10 Essential (primary) hypertension: Secondary | ICD-10-CM | POA: Diagnosis not present

## 2018-03-21 MED ORDER — METOPROLOL SUCCINATE ER 25 MG PO TB24: 25.0000 mg | ORAL_TABLET | Freq: Two times a day (BID) | ORAL | 3 refills | Status: AC

## 2018-03-21 NOTE — Progress Notes (Signed)
Cardiology Office Note:    Date:  03/21/2018   ID:  [name], DOB 09-11-1927, MRN [id number]  PCP:  [facility]  Cardiologist:  Tobias Alexander, MD  Electrophysiologist:  [facility] in [place], [place]  VVS: Dr. [name]  Referring MD: Roe Coombs*   Chief Complaint  Patient presents with  . Follow-up    CHF    History of Present Illness:    [name] is a 84 y.o. male [place] [employer] veteran with coronary artery disease, s/p coronary artery bypass grafting, systolic congestive heart failure, prior CVA, peripheral vascular disease s/p R EIA and CFA endarterectomy in 2016, COPD, symptomatic bradycardia s/p pacemaker, thoracic and abdominal aortic aneurysms disease.  He had a NSTEMI in 12/2013 treated with BMS to S-OM.  He was admitted again in 03/2016 with non-STEMI.  He had deterioration of his renal function and cardiac catheterization was deferred.  It was felt that his elevated troponin levels may have been related to demand ischemia from hypertensive urgency.  EF was 35-40.  Follow-up echo in June 2018 demonstrated normal LV function with mild diastolic dysfunction.  He was last seen by Dr. Delton See 03/13/18 after a visit to the emergency room with stations and shortness of breath.  He was felt to be volume overloaded.  Spironolactone was added.  He was not given Lasix due to prior history of orthostatic hypotension.  He was then admitted 4/11-4/14 with acute hypoxic respiratory failure.  He was treated with BiPAP with rapid improvement.  Notes indicate it was unclear if he had pulmonary edema or COPD exacerbation.  He was discharged on prednisone taper.  CT did demonstrate enlarging nodular density in the peripheral right upper lobe.  There was some concern for malignancy.  He is being referred to pulmonology.  [name] returns for follow-up.  He is here alone.  He feels that his breathing is better.  He denies PND, chest pain, syncope.  He does have right lower  extremity edema.  However, this has been chronic for many years without change.  Overall, he feels that he is doing well.  He plans to help his son pain a car over the weekend.  Prior CV studies:   The following studies were reviewed today:  Chest CT without contrast 03/16/18 IMPRESSION: 1. It is difficult to accurately measure the descending thoracic aortic aneurysm today for the reasons discussed above. The measurements are similar in the interval measuring 5.6 cm today versus 5.5 cm previously. If there is concern, recommend a contrast enhanced study for more accurate measurement. Recommend continued close follow-up if the patient has no acute symptoms today. 2. There is an enlarging nodular density in the peripheral right upper lobe measuring 14 x 9 mm today. While nonspecific, neoplasm must be considered. There is an additional new nodule in the right upper lobe measuring 4.4 mm. 3. Lymphadenopathy in the mediastinum. Several nodes are larger in the interval as above. Given the increasing nodularity in the right upper lobe, these nodes could be neoplastic. Reactive nodes are possible. 4. Increasing left retrocardiac opacity may represent atelectasis or infiltrate. Recommend clinical correlation. 5. Severe emphysematous changes in the lungs. 6. Atherosclerotic changes in the thoracic aorta. Coronary artery calcifications. 7. Small pleural effusions, larger in the interval. Aortic Atherosclerosis (ICD10-I70.0) and Emphysema (ICD10-J43.9).  Echo 06/02/17 Mild LVH, EF 55-60, grade 1 diastolic dysfunction, mild M MR, mild AI, mild LAE, moderate TR  Echo 03/13/16 Mild focal basal septal hypertrophy, EF 35-40%, lateral HK, inferior  and inferolateral akinesis, moderate AI, mild to moderate MR, moderate RAE, mild to moderate TR, PASP 66 mmHg  ABI 12/16 R 0.62; L 0.63  Myoview 6/16 Inferolateral infarct, small apical infarct, no ischemia, EF 32%; high risk based upon multiple infarct  areas and low EF  Echo 12/15 GLS -14.5, EF 45%, diffuse HK, mild to moderate AI, mild MR, mild to moderate LAE, mild to moderate RAE, moderate TR, PASP 35 mm Hg  LHC 2/15 Left main: Distal 30% stenosis.  Left Anterior Descending Artery:  prox 70% followed by mid 100% Circumflex Artery:  mid occlusion.  Right Coronary Artery:  proximal  100%   The vein graft to the distal vessel is known to be occluded.  Graft Anatomy:  SVG to RCA is occluded SVG to OM is patent with 40% ostial stenosis, diffuse aneurysmal enlargement of entire vein graft with 40% mid body stenosis and 99% hazy mid body stenosis.  SVG to Diagonal is patent but the entire graft is aneurysmal with diffuse disease, TIMI-1 flow. Not a target for PCI LIMA to LAD is patent.  Left Ventricular Angiogram: Deferred.  PCI: 5.0 x 12 Veriflex bare metal stent in the mid body of the SVG to the OM Impression: 1. Severe triple vessel CAD with 3/4 patent grafts.  2. Recent NSTEMI and lateral wall ischemia on stress test secondary to severe disease in body of SVG to OM 3. Successful PTCA/bare metal stent x 1 mid body of SVG to OM Recommendations: He will need ASA and Plavix for at least one month but longer if he tolerates well. Continue other cardiac meds. D/C home tomorrow if stable with f/u with Dr. Delton See in 2-3 weeks.   Past Medical History:  Diagnosis Date  . AAA (abdominal aortic aneurysm) (HCC)    Has a known AAA of 3.2 x 3.4 cm by abdominal untrasound 03/27/12. Followup abdominal ultrasound on 03/29/13 showed a diameter of 3.6 cm, which is stable  . Abdominal bruit    Loud  . CAD (coronary artery disease)    a. s/p MI in 1988;  b. s/p CABG in 2000 (Dr. Barry Dienes) x 4, LIMA to LAD, SVG to diagonal, SVG to [facility], SVG to PDA;  c. 12/2013 NSTEMI/abnl MV;  d. 01/2014 Cath/PCI: native 3VD, VG->RCA 100, VG->OM aneurysmal 40ost/m, 60m(5.0x12 Veriflex BMS), VG->Diag aneurysmal, LIMA->LAD nl.  . Chronic systolic CHF (congestive heart failure)  (HCC)    Hospitalization for CHF from 05/04/13-05/06/13. Treated medically. Had possible pneumonia with short course of antibiotics.  Marland Kitchen COPD (chronic obstructive pulmonary disease) (HCC)   . CVA (cerebral infarction) 07/2011   Remote left brain infarct with sensory deficits and had a left internal carotid stent placed by Dr. [name] at that time, no recurrent CVA or TIAs or CNS events.  . Headache   . Hyperlipidemia   . Hypertension   . Hypothyroid    On supplement "don't know if I take RX or not for my thyroid" (01/16/2014)  . Orthostatic hypotension   . PAD (peripheral artery disease) (HCC)    Stable. Had a past RFPBG by Dr. [name] in 2009. this graft is occluded with reconstitution in the popliteal artery, which was patent. Right tibial was occluded and 2-vessel runoff on angiography done by [name] in 12/2010.  Marland Kitchen Peripheral vascular disease (HCC)   . Pneumonia   . Presence of permanent cardiac pacemaker   . Sick sinus syndrome (HCC)    PPM implanted for this and syncope   . Stroke (HCC)   .  Syncope    tilt table test/8.10.01/orthostatic hypotension  . TIA (transient ischemic attack)    Surgical Hx: The patient  has a past surgical history that includes Femoral bypass (Right, ?2001); Carotid stent (Left, 06/23/11); Coronary artery bypass graft (2000); Coronary angioplasty with stent (2001-01/16/2014); Cardiac catheterization (2000); Inguinal hernia repair (Right, 1980's); Insert / replace / remove pacemaker; Cataract extraction w/ intraocular lens  implant, bilateral (Bilateral); abdominal aortagram (N/A, 02/22/2012); carotid angiogram (Bilateral, 02/22/2012); left heart catheterization with coronary/graft angiogram (N/A, 01/16/2014); abdominal aortagram; Colonoscopy; Cardiac catheterization (N/A, 06/24/2015); Endarterectomy femoral (Right, 06/25/2015); and Patch angioplasty (Right, 06/25/2015).   Current Medications: Current Meds  Medication Sig  . acetaminophen (TYLENOL) 325 MG tablet Take  650 mg by mouth every 6 (six) hours as needed for mild pain. Reported on 11/25/2015  . amLODipine (NORVASC) 10 MG tablet Take 10 mg by mouth daily.  Marland Kitchen aspirin 81 MG tablet Take 81 mg by mouth daily.  Marland Kitchen atorvastatin (LIPITOR) 40 MG tablet Take 1 tablet (40 mg total) by mouth daily at 6 PM.  . benzonatate (TESSALON) 200 MG capsule Take 1 capsule (200 mg total) by mouth 3 (three) times daily as needed (refractory cough).  . isosorbide mononitrate (IMDUR) 30 MG 24 hr tablet Take 0.5 tablets (15 mg total) by mouth daily.  . mirtazapine (REMERON) 15 MG tablet Take 15 mg by mouth at bedtime.  . nitroGLYCERIN (NITROSTAT) 0.4 MG SL tablet Place 1 tablet (0.4 mg total) under the tongue every 5 (five) minutes as needed. For chest pain  . potassium chloride (K-DUR) 10 MEQ tablet Take 1 tablet (10 mEq total) by mouth daily.  . predniSONE (DELTASONE) 20 MG tablet Take 3 tablets (60 mg total) by mouth daily before breakfast.  . Tamsulosin HCl (FLOMAX) 0.4 MG CAPS Take 0.4 mg by mouth daily after supper.   . [DISCONTINUED] metoprolol succinate (TOPROL-XL) 25 MG 24 hr tablet Take 1 tablet (25 mg total) by mouth daily. Take with or immediately following a meal.  . [DISCONTINUED] spironolactone (ALDACTONE) 25 MG tablet Take 0.5 tablets (12.5 mg total) by mouth daily.     Allergies:   Tramadol; Pravachol; Pravastatin; Simvastatin; Other; Oxycodone; and Terazosin   Social History   Tobacco Use  . Smoking status: Current Every Day Smoker    Packs/day: 0.50    Years: 68.00    Pack years: 34.00    Types: Cigarettes  . Smokeless tobacco: Never Used  Substance Use Topics  . Alcohol use: No    Alcohol/week: 0.0 oz  . Drug use: No     Family Hx: The patient's family history includes Cancer - Other in his brother; Hyperlipidemia in his brother; Hypertension in his brother; Lung disease in his father; Prostate cancer in his brother.  ROS:   Please see the history of present illness.    Review of Systems    HENT: Positive for hearing loss.   Cardiovascular: Positive for leg swelling.   All other systems reviewed and are negative.   EKGs/Labs/Other Test Reviewed:    EKG:  EKG is  ordered today.  The ekg ordered today demonstrates atrial paced, heart rate 79, left axis deviation, frequent PVCs, similar to prior tracings  Recent Labs: 06/01/2017: ALT 6 03/16/2018: B Natriuretic Peptide 2,630.6 03/18/2018: Hemoglobin 10.9; Platelets 121 03/19/2018: BUN 57; Creatinine, Ser 2.86; Potassium 3.7; Sodium 140   Recent Lipid Panel Lab Results  Component Value Date/Time   CHOL 155 05/31/2017 03:40 PM   TRIG 82 05/31/2017 03:40 PM  HDL 48 05/31/2017 03:40 PM   CHOLHDL 3.2 05/31/2017 03:40 PM   LDLCALC 91 05/31/2017 03:40 PM    Physical Exam:    VS:  BP (!) 166/118   Pulse 79   Ht 5\' 9"  (1.753 m)   Wt 140 lb 12.8 oz (63.9 kg)   SpO2 97%   BMI 20.79 kg/m     Wt Readings from Last 3 Encounters:  03/21/18 140 lb 12.8 oz (63.9 kg)  03/19/18 133 lb 4.8 oz (60.5 kg)  03/13/18 138 lb 9.6 oz (62.9 kg)     Physical Exam  Constitutional: He is oriented to person, place, and time. He appears well-developed and well-nourished. No distress.  HENT:  Head: Normocephalic and atraumatic.  Neck: No JVD present.  Cardiovascular: Normal rate and regular rhythm.  No murmur heard. Pulmonary/Chest: He has decreased breath sounds. He has no rales.  Abdominal: Soft.  Musculoskeletal: He exhibits edema (1+ L ankle edema, no R leg edema).  Neurological: He is alert and oriented to person, place, and time.  Skin: Skin is warm and dry.    ASSESSMENT & PLAN:    #1.  Chronic diastolic heart failure (HCC) EF previously reduced.  However, echo in 6/18 demonstrated normal LV function with mild diastolic dysfunction.  He is not on daily Lasix secondary to history of orthostatic hypotension.  At last visit, he was placed on spironolactone.  Overall, his volume appears stable.  However, his renal function has  worsened.  -Stop spironolactone  -Lasix as needed  -BMET 1 week  #2.  Coronary artery disease with angina Status post prior CABG.  He suffered a non-ST elevation microinfarction in January 2015 treated with a bare-metal stent to the SVG-OM.  He was admitted again in April 2017 with elevated troponin levels.  However, he was treated conservatively secondary to worsening renal function.  He is currently doing well without anginal symptoms.  Continue amlodipine, aspirin, Lipitor, nitrates, Toprol.  #3.  Nodule of upper lobe of right lung This was noted on CT scan in the hospital.  He is a longtime smoker.  I will refer [name] to pulmonology for further evaluation per  #4.  Stage 4 chronic kidney disease (HCC) As noted, his renal function worsened on spironolactone.  I have stopped his spironolactone today.  Repeat BMET 1 week.  #5.  Anemia, unspecified type Recent labs were forwarded to me and demonstrated worsening hemoglobin.  Repeat CBC in 1 week.  #6.  Thoracic aortic aneurysm without rupture (HCC) Continue follow-up with Dr. Arbie CookeyEarly with vascular surgery.  #7.  Hypertension As I am stopping his spironolactone, I will increase his Toprol to 25 mg twice daily.  This should also help lessen some of his PVCs.  He sees his electrophysiologist at the [facility] later this week to interrogate his pacemaker.   Dispo:  Return in about 8 weeks (around 05/16/2018) for Routine Follow Up with Dr. Delton SeeNelson.   Medication Adjustments/Labs and Tests Ordered: Current medicines are reviewed at length with the patient today.  Concerns regarding medicines are outlined above.  Tests Ordered: Orders Placed This Encounter  Procedures  . Basic Metabolic Panel (BMET)  . CBC  . Ambulatory referral to Pulmonology  . EKG 12-Lead   Medication Changes: Meds ordered this encounter  Medications  . metoprolol succinate (TOPROL XL) 25 MG 24 hr tablet    Sig: Take 1 tablet (25 mg total) by mouth 2 (two) times daily.     Dispense:  180 tablet  Refill:  3    DOSE INCREASE    Signed, Tereso Newcomer, PA-C  03/21/2018 5:42 PM    [facility] [address] [place], [place], [place]  [address] Phone: [phone]; Fax: [phone]

## 2018-03-21 NOTE — Patient Instructions (Signed)
Medication Instructions:  1. STOP SPIRONOLACTONE   2. INCREASE TOPROL XL 25 MG TWICE DAILY; NEW RX HAS BEEN SENT IN  Labwork: I 1 WEEK BMET, CBC   Testing/Procedures: NONE ORDERED TODAY  Follow-Up: 1. DR. Delton SeeNELSON IN 6-8 WEEKS  2. YOU ARE BEING REFERRED TO [facility]   Any Other Special Instructions Will Be Listed Below (If Applicable).     If you need a refill on your cardiac medications before your next appointment, please call your pharmacy.

## 2018-03-29 ENCOUNTER — Telehealth: Payer: Self-pay | Admitting: Cardiology

## 2018-03-29 ENCOUNTER — Other Ambulatory Visit: Payer: Medicare Other

## 2018-03-29 MED ORDER — POTASSIUM CHLORIDE ER 10 MEQ PO TBCR: 10.0000 meq | EXTENDED_RELEASE_TABLET | Freq: Every day | ORAL | 3 refills | Status: DC

## 2018-03-29 MED ORDER — ATORVASTATIN CALCIUM 40 MG PO TABS: 40.0000 mg | ORAL_TABLET | Freq: Every day | ORAL | 3 refills | Status: AC

## 2018-03-29 NOTE — Telephone Encounter (Signed)
Medications refilled

## 2018-03-29 NOTE — Telephone Encounter (Signed)
Pt was in hospital per Debbie with [facility], he did not get rx's for two of his meds. He needs those called into [facility] [address], Atorvastatin 40mg  and Potassioum 10mg 

## 2018-03-29 NOTE — Telephone Encounter (Signed)
Ok to fill Atorvastatin and potassium.   Looks like he is on K+ 10 mEq Once daily and Atorvastatin 40 mg Once daily. Tereso NewcomerScott Latori Beggs, PA-C    03/29/2018 2:52 PM

## 2018-03-29 NOTE — Telephone Encounter (Signed)
Patient seen by Tereso NewcomerScott Weaver, 03/21/18. Is scheduled for labs on 03/31/18. Atorvastatin last ordered by cardiology in 2016 and potassium in 2017.  Unsure who has been refilling these.  I called the patient regarding the potassium.  Has a prescription at home but is running out.  Takes one tablet every day.  He has enough to get through the end of the week when his labs are drawn..  Will route to Tereso NewcomerScott Weaver, APP for consideration of refilling these 2 medicines.

## 2018-03-31 ENCOUNTER — Telehealth: Payer: Self-pay | Admitting: *Deleted

## 2018-03-31 ENCOUNTER — Other Ambulatory Visit: Payer: Medicare Other | Admitting: *Deleted

## 2018-03-31 DIAGNOSIS — I5032 Chronic diastolic (congestive) heart failure: Secondary | ICD-10-CM

## 2018-03-31 MED ORDER — POTASSIUM CHLORIDE CRYS ER 20 MEQ PO TBCR: 20.0000 meq | EXTENDED_RELEASE_TABLET | Freq: Every day | ORAL | 2 refills | Status: AC

## 2018-03-31 NOTE — Telephone Encounter (Signed)
[facility] called into the office to report that the pts potassium level was 2.9.  Showed the DOD Dr Ladona Ridgelaylor the pts labs and he advised that the pt increase his KDUR to 20 mEq po daily.  Confirmed the pharmacy of choice for the pt, for he states its easier if we send in the exact amount of this med he should be taking, vs doubling up on his current dose of KDUR.  Sent in a supply of KDUR 20 mEq po daily to his pharmacy.  Advised him that if he hasn't taken his KDUR today, then he should take this.  Pt verbalized understanding and agrees with this plan. This lab was advised on in absence of ordering Provider, Tereso NewcomerScott Weaver PA-C.  Notated that pts CBC is pending as of 03/31/18 at 4:08 pm.

## 2018-04-01 LAB — CBC
Hematocrit: 37.9 % (ref 37.5–51.0)
Hemoglobin: 13.1 g/dL (ref 13.0–17.7)
MCH: 32.8 pg (ref 26.6–33.0)
MCHC: 34.6 g/dL (ref 31.5–35.7)
MCV: 95 fL (ref 79–97)
Platelets: 83 10*3/uL — CL (ref 150–379)
RBC: 4 x10E6/uL — AB (ref 4.14–5.80)
RDW: 16.1 % — ABNORMAL HIGH (ref 12.3–15.4)
WBC: 12.6 10*3/uL — AB (ref 3.4–10.8)

## 2018-04-01 LAB — BASIC METABOLIC PANEL
BUN / CREAT RATIO: 10 (ref 10–24)
BUN: 22 mg/dL (ref 8–27)
CALCIUM: 8.5 mg/dL — AB (ref 8.6–10.2)
CHLORIDE: 103 mmol/L (ref 96–106)
CO2: 23 mmol/L (ref 20–29)
Creatinine, Ser: 2.25 mg/dL — ABNORMAL HIGH (ref 0.76–1.27)
GFR calc Af Amer: 29 mL/min/{1.73_m2} — ABNORMAL LOW (ref >59)
GFR calc non Af Amer: 25 mL/min/{1.73_m2} — ABNORMAL LOW (ref >59)
GLUCOSE: 111 mg/dL — AB (ref 65–99)
Potassium: 2.9 mmol/L — CL (ref 3.5–5.2)
Sodium: 142 mmol/L (ref 134–144)

## 2018-04-04 ENCOUNTER — Telehealth: Payer: Self-pay | Admitting: *Deleted

## 2018-04-04 DIAGNOSIS — N184 Chronic kidney disease, stage 4 (severe): Secondary | ICD-10-CM

## 2018-04-04 NOTE — Telephone Encounter (Signed)
Pt has been notified of lab results by phone with verbal understanding. Pt is agreeable to BMET tomorrow, Wed Apr 05, 2018. I called PCP as well. Pt was just seen yesterday by PCP. I will fax lab results to PCP for further evaluation. Pt thanked me for my call.

## 2018-04-05 ENCOUNTER — Other Ambulatory Visit: Payer: Medicare Other | Admitting: *Deleted

## 2018-04-05 DIAGNOSIS — N184 Chronic kidney disease, stage 4 (severe): Secondary | ICD-10-CM

## 2018-04-05 LAB — BASIC METABOLIC PANEL
BUN / CREAT RATIO: 10 (ref 10–24)
BUN: 22 mg/dL (ref 8–27)
CALCIUM: 8.4 mg/dL — AB (ref 8.6–10.2)
CO2: 22 mmol/L (ref 20–29)
Chloride: 107 mmol/L — ABNORMAL HIGH (ref 96–106)
Creatinine, Ser: 2.22 mg/dL — ABNORMAL HIGH (ref 0.76–1.27)
GFR, EST AFRICAN AMERICAN: 29 mL/min/{1.73_m2} — AB (ref >59)
GFR, EST NON AFRICAN AMERICAN: 25 mL/min/{1.73_m2} — AB (ref >59)
Glucose: 113 mg/dL — ABNORMAL HIGH (ref 65–99)
Potassium: 4.2 mmol/L (ref 3.5–5.2)
Sodium: 143 mmol/L (ref 134–144)

## 2018-04-11 ENCOUNTER — Telehealth: Payer: Self-pay

## 2018-04-11 NOTE — Telephone Encounter (Signed)
ERROR

## 2018-04-13 ENCOUNTER — Other Ambulatory Visit: Payer: Self-pay

## 2018-04-13 ENCOUNTER — Emergency Department (HOSPITAL_COMMUNITY): Payer: Medicare Other

## 2018-04-13 ENCOUNTER — Inpatient Hospital Stay (HOSPITAL_COMMUNITY)
Admission: EM | Admit: 2018-04-13 | Discharge: 2018-04-16 | DRG: 190 | Disposition: A | Discharge status: Home / Self Care | Payer: Medicare Other | Attending: Internal Medicine | Admitting: Internal Medicine

## 2018-04-13 DIAGNOSIS — I252 Old myocardial infarction: Secondary | ICD-10-CM

## 2018-04-13 DIAGNOSIS — E785 Hyperlipidemia, unspecified: Secondary | ICD-10-CM | POA: Diagnosis present

## 2018-04-13 DIAGNOSIS — Z950 Presence of cardiac pacemaker: Secondary | ICD-10-CM

## 2018-04-13 DIAGNOSIS — I5030 Unspecified diastolic (congestive) heart failure: Secondary | ICD-10-CM | POA: Diagnosis present

## 2018-04-13 DIAGNOSIS — R748 Abnormal levels of other serum enzymes: Secondary | ICD-10-CM | POA: Diagnosis present

## 2018-04-13 DIAGNOSIS — F17210 Nicotine dependence, cigarettes, uncomplicated: Secondary | ICD-10-CM | POA: Diagnosis present

## 2018-04-13 DIAGNOSIS — I714 Abdominal aortic aneurysm, without rupture: Secondary | ICD-10-CM | POA: Diagnosis present

## 2018-04-13 DIAGNOSIS — I5033 Acute on chronic diastolic (congestive) heart failure: Secondary | ICD-10-CM | POA: Diagnosis present

## 2018-04-13 DIAGNOSIS — Z8349 Family history of other endocrine, nutritional and metabolic diseases: Secondary | ICD-10-CM

## 2018-04-13 DIAGNOSIS — I25810 Atherosclerosis of coronary artery bypass graft(s) without angina pectoris: Secondary | ICD-10-CM | POA: Diagnosis present

## 2018-04-13 DIAGNOSIS — I739 Peripheral vascular disease, unspecified: Secondary | ICD-10-CM | POA: Diagnosis present

## 2018-04-13 DIAGNOSIS — R918 Other nonspecific abnormal finding of lung field: Secondary | ICD-10-CM | POA: Diagnosis present

## 2018-04-13 DIAGNOSIS — J441 Chronic obstructive pulmonary disease with (acute) exacerbation: Secondary | ICD-10-CM | POA: Diagnosis present

## 2018-04-13 DIAGNOSIS — N184 Chronic kidney disease, stage 4 (severe): Secondary | ICD-10-CM | POA: Diagnosis present

## 2018-04-13 DIAGNOSIS — Z7952 Long term (current) use of systemic steroids: Secondary | ICD-10-CM

## 2018-04-13 DIAGNOSIS — I130 Hypertensive heart and chronic kidney disease with heart failure and stage 1 through stage 4 chronic kidney disease, or unspecified chronic kidney disease: Secondary | ICD-10-CM | POA: Diagnosis present

## 2018-04-13 DIAGNOSIS — E039 Hypothyroidism, unspecified: Secondary | ICD-10-CM | POA: Diagnosis present

## 2018-04-13 DIAGNOSIS — R0902 Hypoxemia: Secondary | ICD-10-CM | POA: Diagnosis present

## 2018-04-13 DIAGNOSIS — J9601 Acute respiratory failure with hypoxia: Secondary | ICD-10-CM | POA: Diagnosis present

## 2018-04-13 DIAGNOSIS — Z8249 Family history of ischemic heart disease and other diseases of the circulatory system: Secondary | ICD-10-CM

## 2018-04-13 DIAGNOSIS — I712 Thoracic aortic aneurysm, without rupture, unspecified: Secondary | ICD-10-CM | POA: Diagnosis present

## 2018-04-13 DIAGNOSIS — I495 Sick sinus syndrome: Secondary | ICD-10-CM | POA: Diagnosis present

## 2018-04-13 DIAGNOSIS — I619 Nontraumatic intracerebral hemorrhage, unspecified: Secondary | ICD-10-CM | POA: Diagnosis present

## 2018-04-13 DIAGNOSIS — I25119 Atherosclerotic heart disease of native coronary artery with unspecified angina pectoris: Secondary | ICD-10-CM | POA: Diagnosis present

## 2018-04-13 DIAGNOSIS — Z720 Tobacco use: Secondary | ICD-10-CM | POA: Diagnosis present

## 2018-04-13 DIAGNOSIS — N183 Chronic kidney disease, stage 3 (moderate): Secondary | ICD-10-CM

## 2018-04-13 DIAGNOSIS — Z7982 Long term (current) use of aspirin: Secondary | ICD-10-CM

## 2018-04-13 DIAGNOSIS — I10 Essential (primary) hypertension: Secondary | ICD-10-CM | POA: Diagnosis present

## 2018-04-13 DIAGNOSIS — Z8673 Personal history of transient ischemic attack (TIA), and cerebral infarction without residual deficits: Secondary | ICD-10-CM

## 2018-04-13 DIAGNOSIS — Z8042 Family history of malignant neoplasm of prostate: Secondary | ICD-10-CM

## 2018-04-13 DIAGNOSIS — N179 Acute kidney failure, unspecified: Secondary | ICD-10-CM

## 2018-04-13 HISTORY — DX: Chronic kidney disease, stage 3 (moderate): N18.3

## 2018-04-13 HISTORY — DX: Chronic kidney disease, stage 3 unspecified: N18.30

## 2018-04-13 HISTORY — DX: Other nonspecific abnormal finding of lung field: R91.8

## 2018-04-13 LAB — I-STAT CHEM 8, ED
BUN: 27 mg/dL — AB (ref 6–20)
CALCIUM ION: 1.09 mmol/L — AB (ref 1.15–1.40)
Chloride: 114 mmol/L — ABNORMAL HIGH (ref 101–111)
Creatinine, Ser: 2.2 mg/dL — ABNORMAL HIGH (ref 0.61–1.24)
Glucose, Bld: 131 mg/dL — ABNORMAL HIGH (ref 65–99)
HCT: 39 % (ref 39.0–52.0)
Hemoglobin: 13.3 g/dL (ref 13.0–17.0)
Potassium: 4 mmol/L (ref 3.5–5.1)
SODIUM: 146 mmol/L — AB (ref 135–145)
TCO2: 25 mmol/L (ref 22–32)

## 2018-04-13 LAB — CBC WITH DIFFERENTIAL/PLATELET
BASOS ABS: 0 10*3/uL (ref 0.0–0.1)
Basophils Relative: 0 %
Eosinophils Absolute: 0.1 10*3/uL (ref 0.0–0.7)
Eosinophils Relative: 1 %
HCT: 39.9 % (ref 39.0–52.0)
HEMOGLOBIN: 13.3 g/dL (ref 13.0–17.0)
LYMPHS PCT: 20 %
Lymphs Abs: 1.7 10*3/uL (ref 0.7–4.0)
MCH: 31.6 pg (ref 26.0–34.0)
MCHC: 33.3 g/dL (ref 30.0–36.0)
MCV: 94.8 fL (ref 78.0–100.0)
MONO ABS: 0.6 10*3/uL (ref 0.1–1.0)
Monocytes Relative: 6 %
NEUTROS ABS: 6.4 10*3/uL (ref 1.7–7.7)
NEUTROS PCT: 73 %
Platelets: 104 10*3/uL — ABNORMAL LOW (ref 150–400)
RBC: 4.21 MIL/uL — AB (ref 4.22–5.81)
RDW: 16.4 % — ABNORMAL HIGH (ref 11.5–15.5)
WBC: 8.8 10*3/uL (ref 4.0–10.5)

## 2018-04-13 LAB — I-STAT TROPONIN, ED: Troponin i, poc: 0.21 ng/mL (ref 0.00–0.08)

## 2018-04-13 LAB — COMPREHENSIVE METABOLIC PANEL
ALBUMIN: 3.1 g/dL — AB (ref 3.5–5.0)
ALT: 15 U/L — ABNORMAL LOW (ref 17–63)
ANION GAP: 9 (ref 5–15)
AST: 19 U/L (ref 15–41)
Alkaline Phosphatase: 83 U/L (ref 38–126)
BILIRUBIN TOTAL: 1.5 mg/dL — AB (ref 0.3–1.2)
BUN: 26 mg/dL — ABNORMAL HIGH (ref 6–20)
CO2: 20 mmol/L — ABNORMAL LOW (ref 22–32)
Calcium: 8.3 mg/dL — ABNORMAL LOW (ref 8.9–10.3)
Chloride: 115 mmol/L — ABNORMAL HIGH (ref 101–111)
Creatinine, Ser: 2.34 mg/dL — ABNORMAL HIGH (ref 0.61–1.24)
GFR calc Af Amer: 27 mL/min — ABNORMAL LOW (ref >60)
GFR calc non Af Amer: 23 mL/min — ABNORMAL LOW (ref >60)
GLUCOSE: 133 mg/dL — AB (ref 65–99)
POTASSIUM: 4 mmol/L (ref 3.5–5.1)
SODIUM: 144 mmol/L (ref 135–145)
TOTAL PROTEIN: 5.9 g/dL — AB (ref 6.5–8.1)

## 2018-04-13 LAB — BRAIN NATRIURETIC PEPTIDE: B NATRIURETIC PEPTIDE 5: 2724.6 pg/mL — AB (ref 0.0–100.0)

## 2018-04-13 MED ORDER — ONDANSETRON HCL 4 MG/2ML IJ SOLN: 4.0000 mg | Freq: Four times a day (QID) | INTRAMUSCULAR | Status: DC | PRN

## 2018-04-13 MED ORDER — FUROSEMIDE 10 MG/ML IJ SOLN
20.0000 mg | Freq: Two times a day (BID) | INTRAMUSCULAR | Status: DC
  Administered 2018-04-13 – 2018-04-16 (×6): 20 mg via INTRAVENOUS
  Filled 2018-04-13 (×6): qty 2

## 2018-04-13 MED ORDER — HYDRALAZINE HCL 20 MG/ML IJ SOLN: 10.0000 mg | Freq: Four times a day (QID) | INTRAMUSCULAR | Status: DC | PRN

## 2018-04-13 MED ORDER — METOPROLOL SUCCINATE ER 25 MG PO TB24
25.0000 mg | ORAL_TABLET | Freq: Two times a day (BID) | ORAL | Status: DC
  Administered 2018-04-13 – 2018-04-16 (×6): 25 mg via ORAL
  Filled 2018-04-13 (×7): qty 1

## 2018-04-13 MED ORDER — MAGNESIUM SULFATE 2 GM/50ML IV SOLN
2.0000 g | Freq: Once | INTRAVENOUS | Status: AC
  Administered 2018-04-13: 2 g via INTRAVENOUS
  Filled 2018-04-13: qty 50

## 2018-04-13 MED ORDER — SODIUM CHLORIDE 0.9% FLUSH: 3.0000 mL | INTRAVENOUS | Status: DC | PRN

## 2018-04-13 MED ORDER — ISOSORBIDE MONONITRATE ER 30 MG PO TB24
15.0000 mg | ORAL_TABLET | Freq: Every day | ORAL | Status: DC
  Administered 2018-04-13 – 2018-04-16 (×4): 15 mg via ORAL
  Filled 2018-04-13 (×5): qty 1

## 2018-04-13 MED ORDER — GUAIFENESIN 100 MG/5ML PO SOLN
15.0000 mL | ORAL | Status: DC | PRN
  Administered 2018-04-14 (×3): 300 mg via ORAL
  Filled 2018-04-13 (×4): qty 15

## 2018-04-13 MED ORDER — AMLODIPINE BESYLATE 10 MG PO TABS
10.0000 mg | ORAL_TABLET | Freq: Every day | ORAL | Status: DC
  Administered 2018-04-13 – 2018-04-16 (×4): 10 mg via ORAL
  Filled 2018-04-13 (×3): qty 1
  Filled 2018-04-13: qty 2

## 2018-04-13 MED ORDER — ACETAMINOPHEN 325 MG PO TABS: 650.0000 mg | ORAL_TABLET | Freq: Four times a day (QID) | ORAL | Status: DC | PRN

## 2018-04-13 MED ORDER — ASPIRIN 81 MG PO CHEW
81.0000 mg | CHEWABLE_TABLET | Freq: Every day | ORAL | Status: DC
  Administered 2018-04-13 – 2018-04-16 (×4): 81 mg via ORAL
  Filled 2018-04-13 (×4): qty 1

## 2018-04-13 MED ORDER — TAMSULOSIN HCL 0.4 MG PO CAPS
0.4000 mg | ORAL_CAPSULE | ORAL | Status: DC
  Administered 2018-04-13 – 2018-04-15 (×3): 0.4 mg via ORAL
  Filled 2018-04-13 (×3): qty 1

## 2018-04-13 MED ORDER — ALBUTEROL SULFATE (2.5 MG/3ML) 0.083% IN NEBU: 2.5000 mg | INHALATION_SOLUTION | RESPIRATORY_TRACT | Status: DC | PRN

## 2018-04-13 MED ORDER — SPIRONOLACTONE 12.5 MG HALF TABLET
12.5000 mg | ORAL_TABLET | Freq: Every day | ORAL | Status: DC
  Administered 2018-04-14 – 2018-04-16 (×3): 12.5 mg via ORAL
  Filled 2018-04-13 (×4): qty 1

## 2018-04-13 MED ORDER — LEVOFLOXACIN IN D5W 750 MG/150ML IV SOLN: 750.0000 mg | Freq: Once | INTRAVENOUS | Status: DC

## 2018-04-13 MED ORDER — ONDANSETRON HCL 4 MG PO TABS: 4.0000 mg | ORAL_TABLET | Freq: Four times a day (QID) | ORAL | Status: DC | PRN

## 2018-04-13 MED ORDER — METHYLPREDNISOLONE SODIUM SUCC 125 MG IJ SOLR
125.0000 mg | Freq: Once | INTRAMUSCULAR | Status: AC
  Administered 2018-04-13: 125 mg via INTRAVENOUS
  Filled 2018-04-13: qty 2

## 2018-04-13 MED ORDER — MIRTAZAPINE 7.5 MG PO TABS
15.0000 mg | ORAL_TABLET | Freq: Every day | ORAL | Status: DC
  Administered 2018-04-13 – 2018-04-15 (×3): 15 mg via ORAL
  Filled 2018-04-13: qty 2
  Filled 2018-04-13: qty 1
  Filled 2018-04-13 (×2): qty 2

## 2018-04-13 MED ORDER — HEPARIN SODIUM (PORCINE) 5000 UNIT/ML IJ SOLN
5000.0000 [IU] | Freq: Three times a day (TID) | INTRAMUSCULAR | Status: DC
  Administered 2018-04-13 – 2018-04-15 (×7): 5000 [IU] via SUBCUTANEOUS
  Filled 2018-04-13 (×8): qty 1

## 2018-04-13 MED ORDER — DOXYCYCLINE HYCLATE 100 MG PO TABS
100.0000 mg | ORAL_TABLET | Freq: Once | ORAL | Status: AC
  Administered 2018-04-13: 100 mg via ORAL
  Filled 2018-04-13: qty 1

## 2018-04-13 MED ORDER — GUAIFENESIN ER 600 MG PO TB12
600.0000 mg | ORAL_TABLET | Freq: Two times a day (BID) | ORAL | Status: DC
  Administered 2018-04-13 – 2018-04-16 (×6): 600 mg via ORAL
  Filled 2018-04-13 (×6): qty 1

## 2018-04-13 MED ORDER — POLYETHYLENE GLYCOL 3350 17 G PO PACK: 17.0000 g | PACK | Freq: Every day | ORAL | Status: DC | PRN

## 2018-04-13 MED ORDER — TRAZODONE HCL 50 MG PO TABS
50.0000 mg | ORAL_TABLET | Freq: Every evening | ORAL | Status: DC | PRN
  Administered 2018-04-13: 50 mg via ORAL
  Filled 2018-04-13: qty 1

## 2018-04-13 MED ORDER — NICOTINE 14 MG/24HR TD PT24
14.0000 mg | MEDICATED_PATCH | Freq: Every day | TRANSDERMAL | Status: DC
  Administered 2018-04-15 – 2018-04-16 (×2): 14 mg via TRANSDERMAL
  Filled 2018-04-13 (×3): qty 1

## 2018-04-13 MED ORDER — POTASSIUM CHLORIDE CRYS ER 20 MEQ PO TBCR
20.0000 meq | EXTENDED_RELEASE_TABLET | Freq: Every day | ORAL | Status: DC
  Administered 2018-04-13 – 2018-04-16 (×4): 20 meq via ORAL
  Filled 2018-04-13 (×4): qty 1

## 2018-04-13 MED ORDER — IPRATROPIUM-ALBUTEROL 0.5-2.5 (3) MG/3ML IN SOLN
3.0000 mL | Freq: Once | RESPIRATORY_TRACT | Status: AC
  Administered 2018-04-13: 3 mL via RESPIRATORY_TRACT
  Filled 2018-04-13: qty 9

## 2018-04-13 MED ORDER — SODIUM CHLORIDE 0.9 % IV SOLN: 250.0000 mL | INTRAVENOUS | Status: DC | PRN

## 2018-04-13 MED ORDER — SODIUM CHLORIDE 0.9% FLUSH
3.0000 mL | Freq: Two times a day (BID) | INTRAVENOUS | Status: DC
  Administered 2018-04-14 – 2018-04-15 (×4): 3 mL via INTRAVENOUS

## 2018-04-13 MED ORDER — DOXYCYCLINE HYCLATE 100 MG PO TABS
100.0000 mg | ORAL_TABLET | Freq: Two times a day (BID) | ORAL | Status: DC
  Administered 2018-04-13 – 2018-04-16 (×6): 100 mg via ORAL
  Filled 2018-04-13 (×6): qty 1

## 2018-04-13 MED ORDER — ATORVASTATIN CALCIUM 40 MG PO TABS
40.0000 mg | ORAL_TABLET | Freq: Every day | ORAL | Status: DC
  Administered 2018-04-14 – 2018-04-15 (×2): 40 mg via ORAL
  Filled 2018-04-13 (×3): qty 1

## 2018-04-13 MED ORDER — IPRATROPIUM-ALBUTEROL 0.5-2.5 (3) MG/3ML IN SOLN
3.0000 mL | Freq: Three times a day (TID) | RESPIRATORY_TRACT | Status: DC
  Administered 2018-04-13 (×2): 3 mL via RESPIRATORY_TRACT
  Filled 2018-04-13 (×2): qty 3

## 2018-04-13 MED ORDER — IPRATROPIUM-ALBUTEROL 0.5-2.5 (3) MG/3ML IN SOLN
3.0000 mL | RESPIRATORY_TRACT | Status: AC
  Administered 2018-04-13 (×2): 3 mL via RESPIRATORY_TRACT

## 2018-04-13 MED ORDER — NITROGLYCERIN 0.4 MG SL SUBL: 0.4000 mg | SUBLINGUAL_TABLET | SUBLINGUAL | Status: DC | PRN

## 2018-04-13 MED ORDER — ACETAMINOPHEN 650 MG RE SUPP: 650.0000 mg | Freq: Four times a day (QID) | RECTAL | Status: DC | PRN

## 2018-04-13 MED ORDER — PREDNISONE 50 MG PO TABS
50.0000 mg | ORAL_TABLET | Freq: Every day | ORAL | Status: DC
  Administered 2018-04-14 – 2018-04-16 (×3): 50 mg via ORAL
  Filled 2018-04-13 (×3): qty 1

## 2018-04-13 MED ORDER — PREDNISONE 20 MG PO TABS: 60.0000 mg | ORAL_TABLET | Freq: Every day | ORAL | Status: DC

## 2018-04-13 MED ORDER — HYDRALAZINE HCL 20 MG/ML IJ SOLN
10.0000 mg | Freq: Once | INTRAMUSCULAR | Status: AC
Start: 2018-04-13 — End: 2018-04-13
  Administered 2018-04-13: 10 mg via INTRAVENOUS
  Filled 2018-04-13: qty 1

## 2018-04-13 NOTE — Plan of Care (Signed)
  Problem: Education: Goal: Knowledge of General Education information will improve Outcome: Progressing   Problem: Clinical Measurements: Goal: Respiratory complications will improve Outcome: Progressing   

## 2018-04-13 NOTE — ED Triage Notes (Signed)
Arrived by EMS with c/o dyspnea and injterm nonprod cough - onset this am; cough becoming progressively worse - currently on NRB mask at 15%

## 2018-04-13 NOTE — ED Notes (Signed)
Neb complete - pt states feeling "much better" - skin hot but dry; resp have slowed to 26/min; however, still labored - continues to have persistent hacky cough

## 2018-04-13 NOTE — ED Notes (Signed)
Report called to rose on 2 c

## 2018-04-13 NOTE — ED Notes (Signed)
PCXR done

## 2018-04-13 NOTE — ED Notes (Signed)
Placed on ccm - atrial fib with occasional unifocal PVCs; denies CP; tachypenic at 36 breaths per min; unable to speak in complete sentences; NRB mask (applied per ems) removed - first breathing tx initiated as per [facility]; EKG done; labs drawn

## 2018-04-13 NOTE — ED Notes (Signed)
Currently on BiPap - settings as follows: 14/8

## 2018-04-13 NOTE — H&P (Signed)
Patient Demographics:    [name], is a 84 y.o. male  MRN: [id number]   DOB - 1927/01/09  Admit Date - 04/13/2018  Outpatient Primary MD for the patient is [place], [facility]   Assessment & Plan:    Principal Problem:   COPD with acute exacerbation (HCC) Active Problems:   HTN (hypertension)   Tobacco abuse   History of CVA (cerebrovascular accident)   CAD (coronary artery disease) of artery bypass graft   Sick sinus syndrome (HCC) s/p [employer] PPM   ICH (intracerebral hemorrhage) (HCC)   Coronary artery disease involving native coronary artery of native heart with angina pectoris (HCC)   Thoracic aortic aneurysm without rupture (HCC)   (HFpEF) heart failure with preserved ejection fraction with Exacerbation   Mass of upper lobe of right lung    1)Acute Hypoxic Resp Failure secondary to acute COPD exacerbation-received steroids, magnesium and bronchodilators in the ED, respiratory status improved, hypoxia improved but did not resolve, continue supplemental oxygen, not requiring BiPAP at this time.  Patient continues to smoke, give doxycycline, transition to prednisone, continue bronchodilators and mucolytics, try to wean off oxygen over the next day or so  2)HFpEF-patient has left-sided pleural effusion, last known EF 55 to 60%, patient presenting with some degree of exacerbation of his chronic diastolic dysfunction (acute on chronic diastolic dysfunction CHF), and to diuresis with IV Lasix 20 mg every 12 hours, continue Aldactone, daily weights, give TED stockings, fluid input and output monitoring, continue isosorbide and metoprolol.  Consider palliative/therapeutic left-sided thoracentesis if respiratory symptoms does not improve despite diuresis, avoid over aggressive diuresis,  please monitor closely as patient has had issues with orthostatic hypotension with diuresis in the past  3)Tobacco Abuse- Smoking cessation counseling for 4 minutes today, give nicotine patch, patient has smoked since she was age 35  4)Rt Upper Lobe Lung mass - CT noted "enlarging nodular density in the peripheral right upper lobe measuring 14 x 9 mm" as well as "an additional new nodule in the right upper lobe measuring 4.4 mm" , -Interventional radiology consulted-felt high risk for procedure-, patient previously advised to follow-up with pulmonology for outpatient PET scan.  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 -at baseline creatinine usually between 2 and 2.4, monitor renal function and electrolytes closely with IV diuresis for CHF.,  Avoid nephrotoxic agent  6)HTN-stage II-continue amlodipine 10 mg daily, continue isosorbide, continue metoprolol 25 mg daily,  may use IV Hydralazine 10 mg  Every 4 hours Prn for systolic blood pressure over 160 mmhg  7)CAD s/p CABG x4 2000 - DES to SVG/OM 2015-no ACS type symptoms at this time, chest pain-free, continue  Toprol-XL 25 mg daily, continue aspirin, Lipitor 40 mg and isosorbide  8) social/ethics-advanced directives discussed with patient and daughter [name] at bedside, patient is a full code  9)Hx of CVA w/ L ICA stent 2012/ H/o PVD/S/p RFPBG 2009 occluded-continue aspirin, and Lipitor  10)Hypothyroidism- Cont home med tx , check TSH  11)History of sick sinus syndrome-status post left chest pacemaker placement, monitor electrolytes closely, continue metoprolol  12)Descending Thoracic Aortic Aneurysm - Infrarenal AAA--measures about 5.5 cm, Followed by Dr. [name] --not surgical  candidate given high risk   With History of - Reviewed by me  Past Medical History:  Diagnosis Date  . AAA (abdominal aortic aneurysm) (HCC)    Has a known AAA of 3.2 x 3.4 cm by abdominal untrasound 03/27/12. Followup abdominal ultrasound on 03/29/13 showed a diameter of 3.6 cm, which is stable  . Abdominal bruit    Loud  . CAD (coronary artery disease)    a. s/p MI in 1988;  b. s/p CABG in 2000 (Dr. Barry Dienes) x 4, LIMA to LAD, SVG to diagonal, SVG to [facility], SVG to PDA;  c. 12/2013 NSTEMI/abnl MV;  d. 01/2014 Cath/PCI: native 3VD, VG->RCA 100, VG->OM aneurysmal 40ost/m, 9m(5.0x12 Veriflex BMS), VG->Diag aneurysmal, LIMA->LAD nl.  . Chronic systolic CHF (congestive heart failure) (HCC)    Hospitalization for CHF from 05/04/13-05/06/13. Treated medically. Had possible pneumonia with short course of antibiotics.  Marland Kitchen COPD (chronic obstructive pulmonary disease) (HCC)   . CVA (cerebral infarction) 07/2011   Remote left brain infarct with sensory deficits and had a left internal carotid stent placed by Dr. [name] at that time, no recurrent CVA or TIAs or CNS events.  . Headache   . Hyperlipidemia   . Hypertension   . Hypothyroid    On supplement "don't know if I take RX or not for my thyroid" (01/16/2014)  . Orthostatic hypotension   . PAD (peripheral artery disease) (HCC)    Stable. Had a past RFPBG by Dr. [name] in 2009. this graft is occluded with reconstitution in the popliteal artery, which was patent. Right tibial was occluded and 2-vessel runoff on angiography done by [name] in 12/2010.  Marland Kitchen Peripheral vascular disease (HCC)   . Pneumonia   . Presence of permanent cardiac pacemaker   . Sick sinus syndrome (HCC)    PPM implanted for this and syncope   . Stroke (HCC)   . Syncope    tilt table test/8.10.01/orthostatic hypotension  . TIA (transient ischemic attack)       Past Surgical History:  Procedure Laterality Date  . ABDOMINAL AORTAGRAM N/A 02/22/2012   Procedure: ABDOMINAL  Ronny Flurry;  Surgeon: Nada Libman, MD;  Location: [facility] CATH LAB;  Service: Cardiovascular;  Laterality: N/A;  . abdominal aortagram    . CARDIAC CATHETERIZATION  2000  . CAROTID ANGIOGRAM Bilateral 02/22/2012   Procedure: CAROTID ANGIOGRAM;  Surgeon: Nada Libman, MD;  Location[facility] CATH LAB;  Service: Cardiovascular;  Laterality: Bilateral;  . CAROTID STENT Left 06/23/11   By [name]  . CATARACT EXTRACTION W/ INTRAOCULAR LENS  IMPLANT, BILATERAL Bilateral   . COLONOSCOPY    . CORONARY ANGIOPLASTY WITH STENT PLACEMENT  2001-01/16/2014   "think today makes #4" (01/16/2014)  . CORONARY ARTERY BYPASS GRAFT  2000   1st in 2000 (Dr. Barry Dienes) x 4, LIMA to LAD, SVG to diagonal, SVG to OM4, SVG to PDA. Re-cath in May 2012 with patent LIMA to LAD, patent SVG to diagonal, patent SVG to OM with left-to-right  collaterals to an occluded right.  Marland Kitchen ENDARTERECTOMY FEMORAL Right 06/25/2015   Procedure: ENDARTERECTOMY RIGHT FEMORAL ARTERY ;  Surgeon: Larina Earthly, MD;  Location: [facility] OR;  Service: Vascular;  Laterality: Right;  . FEMORAL BYPASS Right ?2001  . INGUINAL HERNIA REPAIR Right 1980's  . INSERT / REPLACE / REMOVE PACEMAKER     implanted 2003 by Dr Jenne Campus with gen change (MDT [id number]) 08/2010 by [facility]  . LEFT HEART CATHETERIZATION WITH CORONARY/GRAFT ANGIOGRAM N/A 01/16/2014   Procedure: LEFT HEART CATHETERIZATION WITH Isabel Caprice;  Surgeon: Kathleene Hazel, MD;  Location: [facility] CATH LAB;  Service: Cardiovascular;  Laterality: N/A;  . PATCH ANGIOPLASTY Right 06/25/2015   Procedure: RIGHT COMMON FEMORAL ARTERY AND PROFUNDA PATCH ANGIOPLASTY USING HEMASHIELD PLATINUM FINESSE PATCH ;  Surgeon: Larina Earthly, MD;  Location: [facility] OR;  Service: Vascular;  Laterality: Right;  . PERIPHERAL VASCULAR CATHETERIZATION N/A 06/24/2015   Procedure: Abdominal Aortogram;  Surgeon: Nada Libman, MD;  Location: [facility] INVASIVE CV LAB;  Service: Cardiovascular;  Laterality: N/A;      Chief Complaint  Patient  presents with  . Shortness of Breath      HPI:    [name]  is a 84 y.o. male with past medical history relevant for ongoing tobacco use, COPD, diastolic dysfunction CHF, CAD, history of previous stroke, history of PAD who presents with worsening shortness of breath and cough productive of slightly yellow sputum.  Symptoms have worsened over the last 24 hours,  Patient was admitted with similar symptoms on 03/16/2018 and discharged on 03/19/2018  Additional history obtained from patient's daughter [name] at bedside  No chest pains no palpitations no dizziness  Patient does have some shortness of breath at rest, some dyspnea on exertion and increased lower extremity edema, no paroxysmal nocturnal dyspnea he endorses mild orthopnea  He is voiding less lately,  no vomiting or diarrhea No fevers, no chills, no sick contacts at home  The ED patient received bronchodilators, steroids, supplemental oxygen , magnesium and his respiratory status improved somewhat, however hypoxia persisted       Review of systems:    In addition to the HPI above,   A full 12 point Review of 10 Systems was done, except as stated above, all other Review of 10 Systems were negative.    Social History:  Reviewed by me    Social History   Tobacco Use  . Smoking status: Current Every Day Smoker    Packs/day: 0.50    Years: 68.00    Pack years: 34.00    Types: Cigarettes  . Smokeless tobacco: Never Used  Substance Use Topics  . Alcohol use: No    Alcohol/week: 0.0 oz       Family History :  Reviewed by me    Family History  Problem Relation Age of Onset  . Cancer - Other Brother        Oral  . Prostate cancer Brother   . Hypertension Brother   . Hyperlipidemia Brother   . Lung disease Father        black lung disease     Home Medications:   Prior to Admission medications   Medication Sig Start Date End Date Taking? Authorizing Provider  acetaminophen (TYLENOL) 325 MG tablet  Take 650 mg by mouth every 6 (six) hours as needed for mild pain. Reported on 11/25/2015   Yes [provider]  amLODipine (NORVASC) 10 MG tablet Take 10 mg by mouth daily.  Yes [provider]  aspirin 81 MG tablet Take 81 mg by mouth daily.   Yes [provider]  atorvastatin (LIPITOR) 40 MG tablet Take 1 tablet (40 mg total) by mouth daily at 6 PM. 03/29/18  Yes Weaver, Lorin Picket T, PA-C  benzonatate (TESSALON) 200 MG capsule Take 1 capsule (200 mg total) by mouth 3 (three) times daily as needed (refractory cough). 03/19/18  Yes Rhetta Mura, MD  isosorbide mononitrate (IMDUR) 30 MG 24 hr tablet Take 0.5 tablets (15 mg total) by mouth daily. 03/13/18 03/08/19 Yes Lars Masson, MD  metoprolol succinate (TOPROL XL) 25 MG 24 hr tablet Take 1 tablet (25 mg total) by mouth 2 (two) times daily. 03/21/18  Yes Weaver, Scott T, PA-C  mirtazapine (REMERON) 15 MG tablet Take 15 mg by mouth at bedtime.   Yes [provider]  nitroGLYCERIN (NITROSTAT) 0.4 MG SL tablet Place 1 tablet (0.4 mg total) under the tongue every 5 (five) minutes as needed. For chest pain 10/27/15  Yes Lars Masson, MD  potassium chloride SA (K-DUR,KLOR-CON) 20 MEQ tablet Take 1 tablet (20 mEq total) by mouth daily. 03/31/18  Yes Marinus Maw, MD  predniSONE (DELTASONE) 20 MG tablet Take 3 tablets (60 mg total) by mouth daily before breakfast. 03/20/18  Yes Rhetta Mura, MD  spironolactone (ALDACTONE) 25 MG tablet Take 12.5 mg by mouth daily.   Yes [provider]  Tamsulosin HCl (FLOMAX) 0.4 MG CAPS Take 0.4 mg by mouth daily after supper.    Yes [provider]     Allergies:     Allergies  Allergen Reactions  . Tramadol Nausea And Vomiting    Other reaction(s): Other (See Comments) unknown  . Pravachol Other (See Comments)    myalgias  . Pravastatin Other (See Comments)    myalgias  . Simvastatin Other (See Comments)    myalgia  . Other Other (See  Comments)    Other reaction(s): Other (See Comments) GREEN BEANS AND ONIONS GREEN BEANS AND ONIONS  . Oxycodone Other (See Comments)    Other reaction(s): Other (See Comments) Family member reports "he was highly agitated and disoriented" unknown Family member reports "he was highly agitated and disoriented"   . Terazosin Other (See Comments)    Other reaction(s): Other (See Comments) unknown unknown     Physical Exam:   Vitals  Blood pressure (!) 155/85, pulse 71, temperature 97.7 F (36.5 C), temperature source Rectal, resp. rate 19, height  (1.803 m), weight 63.5 kg (140 lb), SpO2 94 %.  Physical Examination:  General appearance - alert, patient appears to be a "pink puffer", able to speak in short sentences Nose- [place] 2 L/min  mental status - alert, oriented to person, place, and time,  Eyes - sclera anicteric Neck - supple, +veJVD elevation , Chest -diminished breath sounds left more than right, scattered wheezes  heart - S1 and S2 normal, prior CABG scar, left subclavian pacemaker in situ Abdomen - soft, nontender, nondistended, no masses or organomegaly Neurological - screening mental status exam normal, neck supple without rigidity, cranial nerves II through XII intact, DTR's normal and symmetric Extremities -2+ pitting pedal edema noted (right more than left),  intact peripheral pulses  Skin - warm, dry     Data Review:    CBC Recent Labs  Lab 04/13/18 1500 04/13/18 1515  WBC 8.8  --   HGB 13.3 13.3  HCT 39.9 39.0  PLT 104*  --   MCV 94.8  --  MCH 31.6  --   MCHC 33.3  --   RDW 16.4*  --   LYMPHSABS 1.7  --   MONOABS 0.6  --   EOSABS 0.1  --   BASOSABS 0.0  --    ------------------------------------------------------------------------------------------------------------------  Chemistries  Recent Labs  Lab 04/13/18 1500 04/13/18 1515  NA 144 146*  K 4.0 4.0  CL 115* 114*  CO2 20*  --   GLUCOSE 133* 131*  BUN 26* 27*  CREATININE  2.34* 2.20*  CALCIUM 8.3*  --   AST 19  --   ALT 15*  --   ALKPHOS 83  --   BILITOT 1.5*  --    ------------------------------------------------------------------------------------------------------------------ estimated creatinine clearance is 20.4 mL/min (A) (by C-G formula based on SCr of 2.2 mg/dL (H)). ------------------------------------------------------------------------------------------------------------------ No results for input(s): TSH, T4TOTAL, T3FREE, THYROIDAB in the last 72 hours.  Invalid input(s): FREET3   Coagulation profile No results for input(s): INR, PROTIME in the last 168 hours. ------------------------------------------------------------------------------------------------------------------- No results for input(s): DDIMER in the last 72 hours. -------------------------------------------------------------------------------------------------------------------  Cardiac Enzymes No results for input(s): CKMB, TROPONINI, MYOGLOBIN in the last 168 hours.  Invalid input(s): CK ------------------------------------------------------------------------------------------------------------------    Component Value Date/Time   BNP 2,724.6 (H) 04/13/2018 1444     ---------------------------------------------------------------------------------------------------------------  Urinalysis    Component Value Date/Time   COLORURINE STRAW (A) 05/31/2017 1252   APPEARANCEUR CLEAR 05/31/2017 1252   LABSPEC 1.006 05/31/2017 1252   PHURINE 8.0 05/31/2017 1252   GLUCOSEU NEGATIVE 05/31/2017 1252   HGBUR MODERATE (A) 05/31/2017 1252   BILIRUBINUR NEGATIVE 05/31/2017 1252   KETONESUR NEGATIVE 05/31/2017 1252   PROTEINUR 30 (A) 05/31/2017 1252   UROBILINOGEN 1.0 08/10/2015 2242   NITRITE NEGATIVE 05/31/2017 1252   LEUKOCYTESUR NEGATIVE 05/31/2017 1252    ----------------------------------------------------------------------------------------------------------------    Imaging Results:    Dg Chest Port 1 View  Result Date: 04/13/2018 CLINICAL DATA:  Shortness of breath and nonproductive cough. EXAM: PORTABLE CHEST 1 VIEW COMPARISON:  CT chest and chest x-ray dated March 16, 2018. FINDINGS: Unchanged left chest wall pacemaker. Stable cardiomegaly status post CABG. Increased interstitial markings bilaterally. Unchanged descending thoracic aortic aneurysm. Slightly worsened aeration of the left lower lobe with interval increase in size of left pleural effusion. Interstitial opacities at the right lung base and small right pleural effusion are not significantly changed. No acute osseous abnormality. IMPRESSION: 1. Stable cardiomegaly with worsening pulmonary interstitial edema. 2. Increasing left pleural effusion with worsening aeration of the left lower lobe which could reflect pneumonia or atelectasis. 3. Stable interstitial changes at the right lung base and small right pleural effusion. Electronically Signed   By: Obie Dredge M.D.   On: 04/13/2018 15:32   Radiological Exams on Admission: Dg Chest Port 1 View  Result Date: 04/13/2018 CLINICAL DATA:  Shortness of breath and nonproductive cough. EXAM: PORTABLE CHEST 1 VIEW COMPARISON:  CT chest and chest x-ray dated March 16, 2018. FINDINGS: Unchanged left chest wall pacemaker. Stable cardiomegaly status post CABG. Increased interstitial markings bilaterally. Unchanged descending thoracic aortic aneurysm. Slightly worsened aeration of the left lower lobe with interval increase in size of left pleural effusion. Interstitial opacities at the right lung base and small right pleural effusion are not significantly changed. No acute osseous abnormality. IMPRESSION: 1. Stable cardiomegaly with worsening pulmonary interstitial edema. 2. Increasing left pleural effusion with worsening aeration of the left lower lobe which could reflect pneumonia or atelectasis. 3. Stable interstitial changes at the right lung base and small right  pleural  effusion. Electronically Signed   By: Obie Dredge M.D.   On: 04/13/2018 15:32   DVT Prophylaxis -SCD  /heparin AM Labs Ordered, also please review Full Orders  Family Communication: Admission, patients condition and plan of care including tests being ordered have been discussed with the patient and daughter Ms [name] who indicate understanding and agree with the plan   Code Status - Full Code  Likely DC to  Home   Condition   stable  Shon Hale M.D on 04/13/2018 at 5:50 PM  Between 7am to 7pm - Pager - [phone] After 7pm go to [email or web address] - password [email or web address]  [facility] - Office  [phone]  Voice Recognition Reubin Milan dictation system was used to create this note, attempts have been made to correct errors. Please contact the author with questions and/or clarifications.

## 2018-04-13 NOTE — ED Notes (Signed)
In to administer meds - pt sitting upright on stretcher, labored resp at 36/min; skin hot, moist; pt unable to speak only 2 to 3 words at a time; CCM showing frequent runs of V Tach lasting 8-9 beats; hypertensive with diastolic in 120s. Duo-neb administered as per [facility]; admitting team notified; made aware of pt status; states respiratory to place pt on BiPap and he will be down to reassess asap

## 2018-04-13 NOTE — ED Provider Notes (Signed)
[facility] EMERGENCY DEPARTMENT Provider Note   CSN: [id number] Arrival date & time: 04/13/18  1436     History   Chief Complaint Chief Complaint  Patient presents with  . Shortness of Breath    HPI [name] is a 84 y.o. male.  84 yo M with a chief complaint of shortness of breath.  The patient has been coughing and feeling unwell for the past couple days.  He denies chest pain, is having some mild abdominal pain with coughing.  Denies nausea vomiting or diarrhea.  Denies fevers but is having some subjective chills.  Was recently in the hospital for a similar illness.  The history is provided by the patient.  Illness  This is a new problem. The current episode started 2 days ago. The problem occurs constantly. The problem has not changed since onset.Pertinent negatives include no chest pain, no abdominal pain, no headaches and no shortness of breath. Nothing aggravates the symptoms. Nothing relieves the symptoms. He has tried nothing for the symptoms. The treatment provided no relief.    Past Medical History:  Diagnosis Date  . AAA (abdominal aortic aneurysm) (HCC)    Has a known AAA of 3.2 x 3.4 cm by abdominal untrasound 03/27/12. Followup abdominal ultrasound on 03/29/13 showed a diameter of 3.6 cm, which is stable  . Abdominal bruit    Loud  . CAD (coronary artery disease)    a. s/p MI in 1988;  b. s/p CABG in 2000 (Dr. Barry Dienes) x 4, LIMA to LAD, SVG to diagonal, SVG to [facility], SVG to PDA;  c. 12/2013 NSTEMI/abnl MV;  d. 01/2014 Cath/PCI: native 3VD, VG->RCA 100, VG->OM aneurysmal 40ost/m, 71m(5.0x12 Veriflex BMS), VG->Diag aneurysmal, LIMA->LAD nl.  . Chronic systolic CHF (congestive heart failure) (HCC)    Hospitalization for CHF from 05/04/13-05/06/13. Treated medically. Had possible pneumonia with short course of antibiotics.  Marland Kitchen COPD (chronic obstructive pulmonary disease) (HCC)   . CVA (cerebral infarction) 07/2011   Remote left brain infarct with sensory  deficits and had a left internal carotid stent placed by Dr. [name] at that time, no recurrent CVA or TIAs or CNS events.  . Headache   . Hyperlipidemia   . Hypertension   . Hypothyroid    On supplement "don't know if I take RX or not for my thyroid" (01/16/2014)  . Orthostatic hypotension   . PAD (peripheral artery disease) (HCC)    Stable. Had a past RFPBG by Dr. [name] in 2009. this graft is occluded with reconstitution in the popliteal artery, which was patent. Right tibial was occluded and 2-vessel runoff on angiography done by [name] in 12/2010.  Marland Kitchen Peripheral vascular disease (HCC)   . Pneumonia   . Presence of permanent cardiac pacemaker   . Sick sinus syndrome (HCC)    PPM implanted for this and syncope   . Stroke (HCC)   . Syncope    tilt table test/8.10.01/orthostatic hypotension  . TIA (transient ischemic attack)     Patient Active Problem List   Diagnosis Date Noted  . Acute on chronic respiratory failure with hypoxia (HCC) 03/16/2018  . Acute respiratory failure (HCC) 03/16/2018  . Vertigo   . High blood pressure 03/28/2016  . Encounter for follow-up examination after completed treatment for conditions other than malignant neoplasm 03/28/2016  . Thoracic aortic aneurysm without rupture (HCC) 03/15/2016  . Coronary artery disease due to lipid rich plaque   . Chest pain   . Abdominal aortic aneurysm (AAA) without  rupture (HCC)   . Hypertensive emergency   . Acute on chronic systolic CHF (congestive heart failure) (HCC)   . Personal history of other medical treatment 02/11/2016  . Left flank pain, chronic 02/11/2016  . Pyelonephritis 02/11/2016  . AAA (abdominal aortic aneurysm) (HCC) 02/09/2016  . Anxiety 02/09/2016  . Arteriosclerotic cardiovascular disease (ASCVD) 02/09/2016  . Atrial fibrillation (HCC) 02/09/2016  . Benign prostatic hyperplasia 02/09/2016  . Bilateral leg edema 02/09/2016  . CAD of autologous arterial graft 02/09/2016  . Chronic kidney  disease 02/09/2016  . Acute exacerbation of CHF (congestive heart failure) (HCC) 02/09/2016  . Diaphragmatic hernia 02/09/2016  . Hemorrhagic stroke (HCC) 02/09/2016  . Peptic ulcer without hemorrhage or perforation 02/09/2016  . Peripheral arteriosclerosis (HCC) 02/09/2016  . Peripheral vascular disease (HCC) 02/09/2016  . Abdominal pain 01/29/2016  . Nausea 01/29/2016  . UTI (urinary tract infection) 01/29/2016  . Right leg pain 01/29/2016  . AP (abdominal pain)   . Hematuria 07/04/2015  . Faintness   . Post-operative complication 07/03/2015  . Postoperative anemia due to acute blood loss 07/03/2015  . Anemia due to blood loss   . Gross hematuria   . H/O endarterectomy   . BPH (benign prostatic hyperplasia) 06/30/2015  . PAD (peripheral artery disease) (HCC) 06/10/2015  . Elevated troponin 05/13/2015  . Nontraumatic cortical hemorrhage of cerebral hemisphere (HCC) 01/31/2015  . Essential hypertension 01/31/2015  . Orthostatic hypotension 01/31/2015  . HLD (hyperlipidemia) 01/31/2015  . Coronary artery disease involving native coronary artery of native heart with angina pectoris (HCC) 01/31/2015  . PVD (peripheral vascular disease) (HCC) 01/31/2015  . Cardiac pacemaker in situ 01/31/2015  . Presence of cardiac pacemaker 01/31/2015  . Right sided weakness   . Stroke (HCC)   . ICH (intracerebral hemorrhage) (HCC) 11/17/2014  . CAP (community acquired pneumonia) 06/09/2014  . Acute renal failure superimposed on stage 3 chronic kidney disease (HCC) 06/09/2014  . CHF (congestive heart failure) (HCC) 06/09/2014  . Acute-on-chronic renal failure (HCC) 06/09/2014  . Unstable angina (HCC) 01/16/2014  . COPD with acute exacerbation (HCC) 01/04/2014  . Influenza with respiratory manifestations 01/02/2014  . Acute on chronic kidney failure (HCC) 01/02/2014  . Leukocytosis, unspecified 01/02/2014  . Malnutrition of moderate degree (HCC) 01/01/2014  . Acute bronchitis 12/30/2013  .  Chronic combined systolic and diastolic CHF (congestive heart failure) (HCC) 12/30/2013  . NSTEMI (non-ST elevated myocardial infarction) (HCC) 12/30/2013  . CAD (coronary artery disease) of artery bypass graft 12/13/2013  . Sick sinus syndrome [facility]) s/p [employer] PPM 12/13/2013  . Acute on chronic combined systolic and diastolic CHF (congestive heart failure) (HCC) 05/04/2013  . HTN (hypertension) 05/04/2013  . Hyperlipidemia 05/04/2013  . Tobacco abuse 05/04/2013  . History of CVA (cerebrovascular accident) 05/04/2013  . Thrombocytopenia (HCC) 05/04/2013  . Acute on chronic combined systolic and diastolic congestive heart failure (HCC) 05/04/2013  . History of stroke 05/04/2013  . Colon polyp 04/19/2012  . Occlusion and stenosis of carotid artery without mention of cerebral infarction 02/07/2012  . Acquired involutional ptosis of eyelid 01/06/2012  . Entropion and trichiasis of eyelid 01/06/2012    Past Surgical History:  Procedure Laterality Date  . ABDOMINAL AORTAGRAM N/A 02/22/2012   Procedure: ABDOMINAL Ronny Flurry;  Surgeon: Nada Libman, MD;  Location: [facility] CATH LAB;  Service: Cardiovascular;  Laterality: N/A;  . abdominal aortagram    . CARDIAC CATHETERIZATION  2000  . CAROTID ANGIOGRAM Bilateral 02/22/2012   Procedure: CAROTID ANGIOGRAM;  Surgeon: Nada Libman, MD;  Location: [facility] CATH LAB;  Service: Cardiovascular;  Laterality: Bilateral;  . CAROTID STENT Left 06/23/11   By [name]  . CATARACT EXTRACTION W/ INTRAOCULAR LENS  IMPLANT, BILATERAL Bilateral   . COLONOSCOPY    . CORONARY ANGIOPLASTY WITH STENT PLACEMENT  2001-01/16/2014   "think today makes #4" (01/16/2014)  . CORONARY ARTERY BYPASS GRAFT  2000   1st in 2000 (Dr. Barry Dienes) x 4, LIMA to LAD, SVG to diagonal, SVG to OM4, SVG to PDA. Re-cath in May 2012 with patent LIMA to LAD, patent SVG to diagonal, patent SVG to OM with left-to-right collaterals to an occluded right.  Marland Kitchen ENDARTERECTOMY FEMORAL Right 06/25/2015    Procedure: ENDARTERECTOMY RIGHT FEMORAL ARTERY ;  Surgeon: Larina Earthly, MD;  Location[facility] OR;  Service: Vascular;  Laterality: Right;  . FEMORAL BYPASS Right ?2001  . INGUINAL HERNIA REPAIR Right 1980's  . INSERT / REPLACE / REMOVE PACEMAKER     implanted 2003 by Dr Jenne Campus with gen change (MDT [id number]) 08/2010 by [facility]  . LEFT HEART CATHETERIZATION WITH CORONARY/GRAFT ANGIOGRAM N/A 01/16/2014   Procedure: LEFT HEART CATHETERIZATION WITH Isabel Caprice;  Surgeon: Kathleene Hazel, MD;  Location: [facility] CATH LAB;  Service: Cardiovascular;  Laterality: N/A;  . PATCH ANGIOPLASTY Right 06/25/2015   Procedure: RIGHT COMMON FEMORAL ARTERY AND PROFUNDA PATCH ANGIOPLASTY USING HEMASHIELD PLATINUM FINESSE PATCH ;  Surgeon: Larina Earthly, MD;  Location: [facility] OR;  Service: Vascular;  Laterality: Right;  . PERIPHERAL VASCULAR CATHETERIZATION N/A 06/24/2015   Procedure: Abdominal Aortogram;  Surgeon: Nada Libman, MD;  Location: [facility] INVASIVE CV LAB;  Service: Cardiovascular;  Laterality: N/A;        Home Medications    Prior to Admission medications   Medication Sig Start Date End Date Taking? Authorizing Provider  acetaminophen (TYLENOL) 325 MG tablet Take 650 mg by mouth every 6 (six) hours as needed for mild pain. Reported on 11/25/2015   Yes [provider]  amLODipine (NORVASC) 10 MG tablet Take 10 mg by mouth daily.   Yes [provider]  aspirin 81 MG tablet Take 81 mg by mouth daily.   Yes [provider]  atorvastatin (LIPITOR) 40 MG tablet Take 1 tablet (40 mg total) by mouth daily at 6 PM. 03/29/18  Yes Weaver, Lorin Picket T, PA-C  benzonatate (TESSALON) 200 MG capsule Take 1 capsule (200 mg total) by mouth 3 (three) times daily as needed (refractory cough). 03/19/18  Yes Rhetta Mura, MD  isosorbide mononitrate (IMDUR) 30 MG 24 hr tablet Take 0.5 tablets (15 mg total) by mouth daily. 03/13/18 03/08/19 Yes Lars Masson, MD  metoprolol succinate (TOPROL XL) 25  MG 24 hr tablet Take 1 tablet (25 mg total) by mouth 2 (two) times daily. 03/21/18  Yes Weaver, Scott T, PA-C  mirtazapine (REMERON) 15 MG tablet Take 15 mg by mouth at bedtime.   Yes [provider]  nitroGLYCERIN (NITROSTAT) 0.4 MG SL tablet Place 1 tablet (0.4 mg total) under the tongue every 5 (five) minutes as needed. For chest pain 10/27/15  Yes Lars Masson, MD  potassium chloride SA (K-DUR,KLOR-CON) 20 MEQ tablet Take 1 tablet (20 mEq total) by mouth daily. 03/31/18  Yes Marinus Maw, MD  predniSONE (DELTASONE) 20 MG tablet Take 3 tablets (60 mg total) by mouth daily before breakfast. 03/20/18  Yes Rhetta Mura, MD  spironolactone (ALDACTONE) 25 MG tablet Take 12.5 mg by mouth daily.   Yes [provider]  Tamsulosin HCl (FLOMAX)  0.4 MG CAPS Take 0.4 mg by mouth daily after supper.    Yes [provider]    Family History Family History  Problem Relation Age of Onset  . Cancer - Other Brother        Oral  . Prostate cancer Brother   . Hypertension Brother   . Hyperlipidemia Brother   . Lung disease Father        black lung disease    Social History Social History   Tobacco Use  . Smoking status: Current Every Day Smoker    Packs/day: 0.50    Years: 68.00    Pack years: 34.00    Types: Cigarettes  . Smokeless tobacco: Never Used  Substance Use Topics  . Alcohol use: No    Alcohol/week: 0.0 oz  . Drug use: No     Allergies   Tramadol; Pravachol; Pravastatin; Simvastatin; Other; Oxycodone; and Terazosin   Review of Systems Review of Systems  Constitutional: Positive for chills. Negative for fever.  HENT: Positive for congestion. Negative for facial swelling.   Eyes: Negative for discharge and visual disturbance.  Respiratory: Positive for cough. Negative for shortness of breath.   Cardiovascular: Negative for chest pain and palpitations.  Gastrointestinal: Negative for abdominal pain, diarrhea and vomiting.    Musculoskeletal: Negative for arthralgias and myalgias.  Skin: Negative for color change and rash.  Neurological: Negative for tremors, syncope and headaches.  Psychiatric/Behavioral: Negative for confusion and dysphoric mood.     Physical Exam Updated Vital Signs BP (!) 180/96   Pulse 72   Temp 97.7 F (36.5 C) (Rectal)   Resp 20   Ht  (1.803 m)   Wt 63.5 kg (140 lb)   SpO2 100%   BMI 19.53 kg/m   Physical Exam  Constitutional: He is oriented to person, place, and time. He appears well-developed and well-nourished.  HENT:  Head: Normocephalic and atraumatic.  Eyes: Pupils are equal, round, and reactive to light. EOM are normal.  Neck: Normal range of motion. Neck supple. No JVD present.  Cardiovascular: Normal rate and regular rhythm. Exam reveals no gallop and no friction rub.  No murmur heard. Pulmonary/Chest: No respiratory distress. He has wheezes (diffuse with prolonged expiration).  Abdominal: He exhibits no distension. There is no rebound and no guarding.  Musculoskeletal: Normal range of motion. He exhibits edema (3+ to the RLE, 1+ to the left, chronic and unchanged per patient).  Neurological: He is alert and oriented to person, place, and time.  Skin: No rash noted. No pallor.  Psychiatric: He has a normal mood and affect. His behavior is normal.  Nursing note and vitals reviewed.    ED Treatments / Results  Labs (all labs ordered are listed, but only abnormal results are displayed) Labs Reviewed  COMPREHENSIVE METABOLIC PANEL - Abnormal; Notable for the following components:      Result Value   Chloride 115 (*)    CO2 20 (*)    Glucose, Bld 133 (*)    BUN 26 (*)    Creatinine, Ser 2.34 (*)    Calcium 8.3 (*)    Total Protein 5.9 (*)    Albumin 3.1 (*)    ALT 15 (*)    Total Bilirubin 1.5 (*)    GFR calc non Af Amer 23 (*)    GFR calc Af Amer 27 (*)    All other components within normal limits  CBC WITH DIFFERENTIAL/PLATELET - Abnormal;  Notable for the following components:  RBC 4.21 (*)    RDW 16.4 (*)    Platelets 104 (*)    All other components within normal limits  BRAIN NATRIURETIC PEPTIDE - Abnormal; Notable for the following components:   B Natriuretic Peptide 2,724.6 (*)    All other components within normal limits  I-STAT CHEM 8, ED - Abnormal; Notable for the following components:   Sodium 146 (*)    Chloride 114 (*)    BUN 27 (*)    Creatinine, Ser 2.20 (*)    Glucose, Bld 131 (*)    Calcium, Ion 1.09 (*)    All other components within normal limits  I-STAT TROPONIN, ED - Abnormal; Notable for the following components:   Troponin i, poc 0.21 (*)    All other components within normal limits    EKG EKG Interpretation  Date/Time:  Thursday Apr 13 2018 14:56:57 EDT Ventricular Rate:  83 PR Interval:    QRS Duration: 84 QT Interval:  412 QTC Calculation: 490 R Axis:   -47 Text Interpretation:  Atrial fibrillation Left anterior fascicular block Probable anterior infarct, age indeterminate Lateral leads are also involved TECHNICALLY DIFFICULT background noise Otherwise no significant change Confirmed by Melene Plan [id number]) on 04/13/2018 3:29:24 PM   Radiology Dg Chest Port 1 View  Result Date: 04/13/2018 CLINICAL DATA:  Shortness of breath and nonproductive cough. EXAM: PORTABLE CHEST 1 VIEW COMPARISON:  CT chest and chest x-ray dated March 16, 2018. FINDINGS: Unchanged left chest wall pacemaker. Stable cardiomegaly status post CABG. Increased interstitial markings bilaterally. Unchanged descending thoracic aortic aneurysm. Slightly worsened aeration of the left lower lobe with interval increase in size of left pleural effusion. Interstitial opacities at the right lung base and small right pleural effusion are not significantly changed. No acute osseous abnormality. IMPRESSION: 1. Stable cardiomegaly with worsening pulmonary interstitial edema. 2. Increasing left pleural effusion with worsening aeration of the  left lower lobe which could reflect pneumonia or atelectasis. 3. Stable interstitial changes at the right lung base and small right pleural effusion. Electronically Signed   By: Obie Dredge M.D.   On: 04/13/2018 15:32    Procedures Procedures (including critical care time)  Medications Ordered in ED Medications  doxycycline (VIBRA-TABS) tablet 100 mg (has no administration in time range)  ipratropium-albuterol (DUONEB) 0.5-2.5 (3) MG/3ML nebulizer solution 3 mL (3 mLs Nebulization Given 04/13/18 1452)  methylPREDNISolone sodium succinate (SOLU-MEDROL) 125 mg/2 mL injection 125 mg (125 mg Intravenous Given 04/13/18 1534)  magnesium sulfate IVPB 2 g 50 mL (0 g Intravenous Stopped 04/13/18 1701)  ipratropium-albuterol (DUONEB) 0.5-2.5 (3) MG/3ML nebulizer solution 3 mL (3 mLs Nebulization Given 04/13/18 1541)     Initial Impression / Assessment and Plan / ED Course  I have reviewed the triage vital signs and the nursing notes.  Pertinent labs & imaging results that were available during my care of the patient were reviewed by me and considered in my medical decision making (see chart for details).     83 yo M with a chief complaint of cough and shortness of breath.  Patient arrived via EMS with a O2 sat on room air of 85.  Patient was initially started on NRB and quickly tapered to [place].  CXR with similar LLL infiltrate as last admission, exam more consistent with COPD exacerbation.  Will give duonebs, steroids, magnesium and reassess.     CRITICAL CARE Performed by: Rae Roam   Total critical care time: 35 minutes  Critical care time was exclusive of  separately billable procedures and treating other patients.  Critical care was necessary to treat or prevent imminent or life-threatening deterioration.  Critical care was time spent personally by me on the following activities: development of treatment plan with patient and/or surrogate as well as nursing, discussions with  consultants, evaluation of patient's response to treatment, examination of patient, obtaining history from patient or surrogate, ordering and performing treatments and interventions, ordering and review of laboratory studies, ordering and review of radiographic studies, pulse oximetry and re-evaluation of patient's condition.  Some provement with breathing treatments and steroids and magnesium.  Still requiring oxygen.  Will discuss with the hospitalist.  The patients results and plan were reviewed and discussed.   Any x-rays performed were independently reviewed by myself.   Differential diagnosis were considered with the presenting HPI.  Medications  doxycycline (VIBRA-TABS) tablet 100 mg (has no administration in time range)  ipratropium-albuterol (DUONEB) 0.5-2.5 (3) MG/3ML nebulizer solution 3 mL (3 mLs Nebulization Given 04/13/18 1452)  methylPREDNISolone sodium succinate (SOLU-MEDROL) 125 mg/2 mL injection 125 mg (125 mg Intravenous Given 04/13/18 1534)  magnesium sulfate IVPB 2 g 50 mL (0 g Intravenous Stopped 04/13/18 1701)  ipratropium-albuterol (DUONEB) 0.5-2.5 (3) MG/3ML nebulizer solution 3 mL (3 mLs Nebulization Given 04/13/18 1541)    Vitals:   04/13/18 1515 04/13/18 1522 04/13/18 1530 04/13/18 1545  BP: (!) 186/102  (!) 165/107 (!) 180/96  Pulse: 87  87 72  Resp: (!) 25  (!) 23 20  Temp:  97.7 F (36.5 C)    TempSrc:  Rectal    SpO2: 97%  100% 100%  Weight:      Height:        Final diagnoses:  COPD exacerbation (HCC)    Admission/ observation were discussed with the admitting physician, patient and/or family and they are comfortable with the plan.   Final Clinical Impressions(s) / ED Diagnoses   Final diagnoses:  COPD exacerbation [facility])    ED Discharge Orders    None       Melene Plan, DO 04/13/18 1710

## 2018-04-13 NOTE — ED Notes (Signed)
RT notified of need for BiPap - currently in with trauma pt - will be in with BiPap machine asap

## 2018-04-13 NOTE — Progress Notes (Signed)
  Patient had episode of respiratory distress while still in the ED with increased work of breathing,  Placed on BiPAP, will transfer to stepdown for now, wean off BiPAP may be transferred back to telemetry once he is off BiPAP

## 2018-04-13 NOTE — ED Notes (Signed)
ED MD made aware of present status and complaint - in to assess

## 2018-04-13 NOTE — ED Notes (Signed)
RT in at bedside to place pt on BiPap - rationale explained to pt - verbalized understanding

## 2018-04-13 NOTE — ED Notes (Signed)
Writer notified EDP Floyd of abnormal I stat trop result.

## 2018-04-14 ENCOUNTER — Encounter (HOSPITAL_COMMUNITY): Payer: Self-pay | Admitting: General Practice

## 2018-04-14 ENCOUNTER — Institutional Professional Consult (permissible substitution): Payer: Medicare Other | Admitting: Internal Medicine

## 2018-04-14 DIAGNOSIS — J441 Chronic obstructive pulmonary disease with (acute) exacerbation: Principal | ICD-10-CM

## 2018-04-14 LAB — BASIC METABOLIC PANEL
ANION GAP: 10 (ref 5–15)
BUN: 29 mg/dL — ABNORMAL HIGH (ref 6–20)
CALCIUM: 8.2 mg/dL — AB (ref 8.9–10.3)
CO2: 20 mmol/L — ABNORMAL LOW (ref 22–32)
Chloride: 115 mmol/L — ABNORMAL HIGH (ref 101–111)
Creatinine, Ser: 2.44 mg/dL — ABNORMAL HIGH (ref 0.61–1.24)
GFR calc Af Amer: 25 mL/min — ABNORMAL LOW (ref >60)
GFR, EST NON AFRICAN AMERICAN: 22 mL/min — AB (ref >60)
GLUCOSE: 182 mg/dL — AB (ref 65–99)
Potassium: 4.8 mmol/L (ref 3.5–5.1)
SODIUM: 145 mmol/L (ref 135–145)

## 2018-04-14 LAB — TSH: TSH: 0.907 u[IU]/mL (ref 0.350–4.500)

## 2018-04-14 LAB — MAGNESIUM: MAGNESIUM: 2.4 mg/dL (ref 1.7–2.4)

## 2018-04-14 MED ORDER — BENZONATATE 100 MG PO CAPS
200.0000 mg | ORAL_CAPSULE | Freq: Three times a day (TID) | ORAL | Status: DC
  Administered 2018-04-14 – 2018-04-16 (×7): 200 mg via ORAL
  Filled 2018-04-14 (×7): qty 2

## 2018-04-14 MED ORDER — IPRATROPIUM-ALBUTEROL 0.5-2.5 (3) MG/3ML IN SOLN
3.0000 mL | Freq: Three times a day (TID) | RESPIRATORY_TRACT | Status: DC
  Administered 2018-04-14 – 2018-04-16 (×6): 3 mL via RESPIRATORY_TRACT
  Filled 2018-04-14 (×7): qty 3

## 2018-04-14 MED ORDER — BUDESONIDE 0.25 MG/2ML IN SUSP
0.2500 mg | Freq: Two times a day (BID) | RESPIRATORY_TRACT | Status: DC
  Administered 2018-04-14 – 2018-04-16 (×5): 0.25 mg via RESPIRATORY_TRACT
  Filled 2018-04-14 (×4): qty 2

## 2018-04-14 NOTE — Progress Notes (Signed)
Text paged MD about yesterdays Trop results (0.21) for clarification on plan.   [place]  Donis Kotowski, RN

## 2018-04-14 NOTE — Progress Notes (Signed)
Pt unable to tolerate bipap.  Pt c/o dry mouth and thirst.  Pt states "I don't care I want this mask off me, I want to drink." Bipap removed from pt.  Pt respiratory rate 16-20 with no use of accessory muscles noted at this time.  Oxygen saturation 98% on 3L [place].  Pt educated to call if increased WOB or shortness of breath returns.  Pt verbalizes understanding.

## 2018-04-14 NOTE — Progress Notes (Addendum)
PROGRESS NOTE    [name]  WJX:[id number] DOB: 1927/08/27 DOA: 04/13/2018 PCP: [facility]   Brief Narrative: Patient is a 84 year old male with past mental history of COPD, ongoing tobacco abuse, diastolic CHF, coronary artery disease, stroke, peripheral artery disease who presented to the emergency department with shortness of breath and cough with  yellow sputum.  Patient was admitted for the COPD exacerbation.  He had to be put on BiPAP for worsening shortness of breath on admission and he is off of it now.  Assessment & Plan:   Principal Problem:   COPD with acute exacerbation (HCC) Active Problems:   HTN (hypertension)   Tobacco abuse   History of CVA (cerebrovascular accident)   CAD (coronary artery disease) of artery bypass graft   Sick sinus syndrome (HCC) s/p [employer] PPM   ICH (intracerebral hemorrhage) (HCC)   Coronary artery disease involving native coronary artery of native heart with angina pectoris (HCC)   Thoracic aortic aneurysm without rupture (HCC)   (HFpEF) heart failure with preserved ejection fraction with Exacerbation   Mass of upper lobe of right lung   Hypoxia  Acute hypoxic respiratory failure: Secondary to COPD exacerbation.  Continue supplemental oxygen as needed.  Continue bronchodilators and steroids.  Currently off BiPAP.  COPD exacerbation: Continue oxygen as needed, bronchodilators, steroids.  Also started on doxycycline.  Patient continues to smoke.  Counseled for smoking cessation. He has an appointment with pulmonology on June 5 with Dr. Sherene Sires.  Heart failure with preserved ejection fraction: Patient has left-sided pleural effusion.  Ejection fraction was 55 to 60% as per last echocardiogram.  Start on Lasix 20 mg every 12 hours.  Continue Aldactone.  We will continue to monitor input/output, daily weight.  Patient also on isosorbide and metoprolol.  Will consider left-sided thoracocentesis if symptoms do not  improve. Elevated BNP.  Tobacco abuse: Says he smokes 3 to 4 cigarettes a day and has been trying to cut down.  Started on nicotine patch.  Lung nodule: CT chest done on last admission showed enlarging nodular density in the peripheral right upper lobe measuring 14 x 9 mm" as well as "an additional new nodule in the right upper lobe measuring 4.4 mm", -Interventional radiology consulted-felt high risk for procedure-, patient previously advised to follow-up with pulmonology for outpatient PET scan. Patient has an appointment with pulmonology on June 5.  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  Continue diuresis.  Avoid nephrotoxic agents.  Hypertension: Continue current medications.  Blood pressure currently stable.  History of coronary disease/elevated troponin: Status post CABG x4 2000 - DES to SVG/OM 2015. Currently chest pain-free.  Continue home meds. Troponin found to be elevated on presentation.  Patient does not have any chest pain.  Most likely associated with his CKD and CHF.  We will continue to monitor.  History of CVA:L ICA stent 2012. H/o PVD.S/p RFPBG in 2009 o.Continue aspirin, and Lipitor  Hypothyroidism: Continue thyroxine.  History of sick sinus syndrome: Status post pacemaker placement.  Continue metoprolol.  Descending thoracic aortic aneurysm: Infrarenal aortic artery aneurysm.  Size 5.5 cm.  Followed by Dr. [name]. Not a surgical candidate given high risk.   DVT prophylaxis: Heparin [place] Code Status: Full Family Communication: None present at the bedside  Disposition Plan: likely home after stabilization of COPD    Consultants: None  Procedures:None  Antimicrobials:Doxy  Subjective: Patient seen and examined the pressure this morning.  Was having hacking cough.  Off BiPAP at present.  States he does not feel good.  Objective: Vitals:   04/14/18 0808 04/14/18 0822 04/14/18 0905 04/14/18 1150  BP:  (!) 151/73  134/88  Pulse:  70 99  73  Resp:  20  17  Temp:  98.1 F (36.7 C)  98.4 F (36.9 C)  TempSrc:  Oral  Oral  SpO2: 98% 97%  100%  Weight:      Height:        Intake/Output Summary (Last 24 hours) at 04/14/2018 1308 Last data filed at 04/14/2018 1100 Gross per 24 hour  Intake 366 ml  Output 525 ml  Net -159 ml   Filed Weights   04/13/18 1441 04/13/18 2020 04/14/18 0633  Weight: 63.5 kg (140 lb) 68.2 kg (150 lb 5.7 oz) 68 kg (149 lb 14.6 oz)    Examination:  General exam:Not in distress,thin built, elderly male HEENT:PERRL,Oral mucosa moist, Ear/Nose normal on gross exam Respiratory system: Bilateral decreased air entry, coarse breathing sounds Cardiovascular system: S1 & S2 heard, RRR. No JVD, murmurs, rubs, gallops or clicks. 1-2 + pedal edema. Gastrointestinal system: Abdomen is nondistended, soft and nontender. No organomegaly or masses felt. Normal bowel sounds heard. Central nervous system: Alert and oriented. No focal neurological deficits. Extremities: 1-2 + pedal edema, no clubbing ,no cyanosis, distal peripheral pulses palpable. Skin: No rashes, lesions or ulcers,no icterus ,no pallor MSK: Normal muscle bulk,tone ,power Psychiatry: Judgement and insight appear normal. Mood & affect appropriate.     Data Reviewed: I have personally reviewed following labs and imaging studies  CBC: Recent Labs  Lab 04/13/18 1500 04/13/18 1515  WBC 8.8  --   NEUTROABS 6.4  --   HGB 13.3 13.3  HCT 39.9 39.0  MCV 94.8  --   PLT 104*  --    Basic Metabolic Panel: Recent Labs  Lab 04/13/18 1500 04/13/18 1515 04/14/18 0254  NA 144 146* 145  K 4.0 4.0 4.8  CL 115* 114* 115*  CO2 20*  --  20*  GLUCOSE 133* 131* 182*  BUN 26* 27* 29*  CREATININE 2.34* 2.20* 2.44*  CALCIUM 8.3*  --  8.2*  MG  --   --  2.4   GFR: Estimated Creatinine Clearance: 19.7 mL/min (A) (by C-G formula based on SCr of 2.44 mg/dL (H)). Liver Function Tests: Recent Labs  Lab 04/13/18 1500  AST 19  ALT 15*  ALKPHOS 83    BILITOT 1.5*  PROT 5.9*  ALBUMIN 3.1*   No results for input(s): LIPASE, AMYLASE in the last 168 hours. No results for input(s): AMMONIA in the last 168 hours. Coagulation Profile: No results for input(s): INR, PROTIME in the last 168 hours. Cardiac Enzymes: No results for input(s): CKTOTAL, CKMB, CKMBINDEX, TROPONINI in the last 168 hours. BNP (last 3 results) No results for input(s): PROBNP in the last 8760 hours. HbA1C: No results for input(s): HGBA1C in the last 72 hours. CBG: No results for input(s): GLUCAP in the last 168 hours. Lipid Profile: No results for input(s): CHOL, HDL, LDLCALC, TRIG, CHOLHDL, LDLDIRECT in the last 72 hours. Thyroid Function Tests: Recent Labs    04/14/18 0254  TSH 0.907   Anemia Panel: No results for input(s): VITAMINB12, FOLATE, FERRITIN, TIBC, IRON, RETICCTPCT in the last 72 hours. Sepsis Labs: No results for input(s): PROCALCITON, LATICACIDVEN in the last 168 hours.  No results found for this or any previous visit (from the past 240 hour(s)).       Radiology Studies: Dg  Chest Port 1 View  Result Date: 04/13/2018 CLINICAL DATA:  Shortness of breath and nonproductive cough. EXAM: PORTABLE CHEST 1 VIEW COMPARISON:  CT chest and chest x-ray dated March 16, 2018. FINDINGS: Unchanged left chest wall pacemaker. Stable cardiomegaly status post CABG. Increased interstitial markings bilaterally. Unchanged descending thoracic aortic aneurysm. Slightly worsened aeration of the left lower lobe with interval increase in size of left pleural effusion. Interstitial opacities at the right lung base and small right pleural effusion are not significantly changed. No acute osseous abnormality. IMPRESSION: 1. Stable cardiomegaly with worsening pulmonary interstitial edema. 2. Increasing left pleural effusion with worsening aeration of the left lower lobe which could reflect pneumonia or atelectasis. 3. Stable interstitial changes at the right lung base and small  right pleural effusion. Electronically Signed   By: Obie Dredge M.D.   On: 04/13/2018 15:32        Scheduled Meds: . amLODipine  10 mg Oral Daily  . aspirin  81 mg Oral Daily  . atorvastatin  40 mg Oral q1800  . benzonatate  200 mg Oral TID  . budesonide (PULMICORT) nebulizer solution  0.25 mg Nebulization BID  . doxycycline  100 mg Oral Q12H  . furosemide  20 mg Intravenous Q12H  . guaiFENesin  600 mg Oral BID  . heparin  5,000 Units Subcutaneous Q8H  . ipratropium-albuterol  3 mL Nebulization TID  . isosorbide mononitrate  15 mg Oral Daily  . metoprolol succinate  25 mg Oral BID  . mirtazapine  15 mg Oral QHS  . nicotine  14 mg Transdermal Daily  . potassium chloride SA  20 mEq Oral Daily  . predniSONE  50 mg Oral Q breakfast  . sodium chloride flush  3 mL Intravenous Q12H  . spironolactone  12.5 mg Oral Daily  . tamsulosin  0.4 mg Oral PC supper   Continuous Infusions: . sodium chloride       LOS: 1 day    Time spent: 35 mins.More than 50% of that time was spent in counseling and/or coordination of care.      Burnadette Pop, MD [facility] Pager [phone]  If 7PM-7AM, please contact night-coverage [email or web address] Password [email or web address] 04/14/2018, 1:08 PM

## 2018-04-14 NOTE — Progress Notes (Signed)
Paired PVCs; some beats Afib while others look sinus; and pacing on monitor.   [place]  Anzal Bartnick, RN

## 2018-04-14 NOTE — Progress Notes (Signed)
Pt oxygen saturation at 98% on 3L [place], but pt c/o SOB and increased WOB noted.  Pt RR increasing to 36-42 with use of accessory muscles noted.  Bipap placed back on patient due to increased work of breathing and SOB at current settings of 14/8.  Respiratory Therapy notified.

## 2018-04-14 NOTE — Evaluation (Signed)
Physical Therapy Evaluation/Discharge Patient Details Name: [name] MRN: [id number] DOB: October 10, 1927 Today's Date: 04/14/2018   History of Present Illness  Pt is an 84 y.o. male with history of CHF, COPD, CVA, HTN, and hypothyroidism who presented to the ED with shortness of breath. Admitted with acute exacerbation of COPD. Pt recently with mild CHF exacerbation diagnosed by cardiologist.   Clinical Impression  Based on pt report, pt is at baseline mobility level as he is able to ambulate community distances mod I and perfomed transfers and bed mobility at baseline. Pt is driven to return home and accomplish as much as possible independently. Pt demonstrates balance deficits during tandem stance but based on clinical judgement, pt does not require acute PT at this time. Pt is agreeable to D/C from PT.    Follow Up Recommendations No PT follow up    Equipment Recommendations  None recommended by PT    Recommendations for Other Services       Precautions / Restrictions Precautions Precautions: None      Mobility  Bed Mobility Overal bed mobility: Modified Independent             General bed mobility comments: Pt performed supine with HOB elevated>sit EOB Mod I  Transfers Overall transfer level: Modified independent Equipment used: None             General transfer comment: Pt performed sit<>stand x 2 from bed Mod I without AD  Ambulation/Gait Ambulation/Gait assistance: Modified independent (Device/Increase time) Ambulation Distance (Feet): 200 Feet Assistive device: None Gait Pattern/deviations: WFL(Within Functional Limits)   Gait velocity interpretation: >2.62 ft/sec, indicative of community ambulatory General Gait Details: Pt ambulated at speed [facility] and no LOB was observed during gait. Pt able to carry telemety monitor while ambulating. During gait SpO2% decreased to 88% but waveform was inconsistent and SpO2 improved to >92% quickly.  Stairs             Wheelchair Mobility    Modified Rankin (Stroke Patients Only)       Balance Overall balance assessment: Needs assistance Sitting-balance support: No upper extremity supported;Feet unsupported Sitting balance-Leahy Scale: Good Sitting balance - Comments: Pt able to donn socks when sitting EOB Independently   Standing balance support: No upper extremity supported   Standing balance comment: Pt able to stand safely without UE support     Tandem Stance - Right Leg: 3 Tandem Stance - Left Leg: 3 [place] - Eyes Opened: 10     High Level Balance Comments: Pt demonstrated difficulty with tandem stance but was able to stand with feet together without LOB             Pertinent Vitals/Pain Pain Assessment: No/denies pain    Home Living Family/patient expects to be discharged to:: Private residence Living Arrangements: Children Available Help at Discharge: Family;Available PRN/intermittently Type of Home: House Home Access: Stairs to enter Entrance Stairs-Rails: None Entrance Stairs-Number of Steps: 2 Home Layout: One level Home Equipment: Walker - 4 wheels;Cane - single point;Shower seat;Bedside commode Additional Comments: Pt does not use DME at home    Prior Function Level of Independence: Independent         Comments: Reports independence and working on fixing cars.      Hand Dominance        Extremity/Trunk Assessment   Upper Extremity Assessment Upper Extremity Assessment: Overall WFL for tasks assessed    Lower Extremity Assessment Lower Extremity Assessment: Overall WFL for tasks assessed  Communication   Communication: No difficulties  Cognition Arousal/Alertness: Awake/alert Behavior During Therapy: WFL for tasks assessed/performed Overall Cognitive Status: Within Functional Limits for tasks assessed                                        General Comments General comments (skin integrity, edema, etc.): Pt has  persistent cough throughout session    Exercises     Assessment/Plan    PT Assessment Patent does not need any further PT services  PT Problem List         PT Treatment Interventions      PT Goals (Current goals can be found in the Care Plan section)  Acute Rehab PT Goals Patient Stated Goal: To go home PT Goal Formulation: With patient Time For Goal Achievement: 04/28/18 Potential to Achieve Goals: Good    Frequency     Barriers to discharge        Co-evaluation               AM-PAC PT "6 Clicks" Daily Activity  Outcome Measure Difficulty turning over in bed (including adjusting bedclothes, sheets and blankets)?: None Difficulty moving from lying on back to sitting on the side of the bed? : None Difficulty sitting down on and standing up from a chair with arms (e.g., wheelchair, bedside commode, etc,.)?: None Help needed moving to and from a bed to chair (including a wheelchair)?: None Help needed walking in hospital room?: None Help needed climbing 3-5 steps with a railing? : A Little 6 Click Score: 23    End of Session Equipment Utilized During Treatment: Gait belt Activity Tolerance: Patient tolerated treatment well Patient left: in chair;with call bell/phone within reach Nurse Communication: Mobility status PT Visit Diagnosis: Other abnormalities of gait and mobility (R26.89)    Time: 9604-5409 PT Time Calculation (min) (ACUTE ONLY): 30 min   Charges:   PT Evaluation $PT Eval Moderate Complexity: 1 Mod     PT G Codes:       Gabe Jozalyn Baglio, SPT  [employer] 04/14/2018, 2:29 PM

## 2018-04-14 NOTE — Progress Notes (Signed)
PT Cancellation Note  Patient Details Name: [name] MRN: [id number] DOB: [birth year]   Cancelled Treatment:    Reason Eval/Treat Not Completed: Medical issues which prohibited therapy(only troponin value of .21 await MD clearance or redraw prior to initiation of evaluation)   Acheron Sugg B Wanza Szumski 04/14/2018, 8:19 AM  Delaney Meigs, PT [phone]

## 2018-04-14 NOTE — Progress Notes (Signed)
Pt states he is allergic to green beans and onions; removed tray from room and ordered a new tray.   [place]  Aadon Gorelik, RN

## 2018-04-15 LAB — BASIC METABOLIC PANEL
Anion gap: 9 (ref 5–15)
BUN: 47 mg/dL — AB (ref 6–20)
CHLORIDE: 112 mmol/L — AB (ref 101–111)
CO2: 23 mmol/L (ref 22–32)
CREATININE: 2.72 mg/dL — AB (ref 0.61–1.24)
Calcium: 8.2 mg/dL — ABNORMAL LOW (ref 8.9–10.3)
GFR, EST AFRICAN AMERICAN: 22 mL/min — AB (ref >60)
GFR, EST NON AFRICAN AMERICAN: 19 mL/min — AB (ref >60)
Glucose, Bld: 119 mg/dL — ABNORMAL HIGH (ref 65–99)
Potassium: 5.1 mmol/L (ref 3.5–5.1)
SODIUM: 144 mmol/L (ref 135–145)

## 2018-04-15 LAB — TROPONIN I: Troponin I: 0.25 ng/mL (ref <0.03)

## 2018-04-15 NOTE — Progress Notes (Signed)
PROGRESS NOTE    [name]  ZOX:[id number] DOB: Feb 24, 1927 DOA: 04/13/2018 PCP: [facility]   Brief Narrative: Patient is a 84 year old male with past mental history of COPD, ongoing tobacco abuse, diastolic CHF, coronary artery disease, stroke, peripheral artery disease who presented to the emergency department with shortness of breath and cough with  yellow sputum.  Patient was admitted for the COPD exacerbation.  He had to be put on BiPAP for worsening shortness of breath on admission and he is off of it now.  Assessment & Plan:   Principal Problem:   COPD with acute exacerbation (HCC) Active Problems:   HTN (hypertension)   Tobacco abuse   History of CVA (cerebrovascular accident)   CAD (coronary artery disease) of artery bypass graft   Sick sinus syndrome (HCC) s/p [employer] PPM   ICH (intracerebral hemorrhage) (HCC)   Coronary artery disease involving native coronary artery of native heart with angina pectoris (HCC)   Thoracic aortic aneurysm without rupture (HCC)   (HFpEF) heart failure with preserved ejection fraction with Exacerbation   Mass of upper lobe of right lung   Hypoxia  Acute hypoxic respiratory failure: Secondary to COPD exacerbation.  Continue supplemental oxygen as needed.  Continue bronchodilators and steroids.  Currently off BiPAP but he did use it at night.  COPD exacerbation: Continue oxygen as needed, bronchodilators, steroids.  Also started on doxycycline.  Patient continues to smoke.  Counseled for smoking cessation. He has an appointment with pulmonology on June 5 with Dr. Sherene Sires.  Heart failure with preserved ejection fraction: Patient has left-sided pleural effusion.  Ejection fraction was 55 to 60% as per last echocardiogram.  Start on Lasix 20 mg every 12 hours.  Continue Aldactone.  We will continue to monitor input/output, daily weight.  Patient also on isosorbide and metoprolol.  Will consider left-sided  thoracocentesis if symptoms do not improve. Elevated BNP.  Tobacco abuse: Says he smokes 3 to 4 cigarettes a day and has been trying to cut down.  Started on nicotine patch.  Lung nodule: CT chest done on last admission showed enlarging nodular density in the peripheral right upper lobe measuring 14 x 9 mm" as well as "an additional new nodule in the right upper lobe measuring 4.4 mm", -Interventional radiology consulted-felt high risk for procedure-, patient previously advised to follow-up with pulmonology for outpatient PET scan. Patient has an appointment with pulmonology on June 5.  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  Continue diuresis.  Avoid nephrotoxic agents.Creatinine slightly got worsened today.  We will keep an eye on it.  Hypertension: Continue current medications.  Blood pressure currently stable.  History of coronary disease/elevated troponin: Status post CABG x4 2000 - DES to SVG/OM 2015. Currently chest pain-free.  Continue home meds. Troponin found to be elevated on presentation.  Patient does not have any chest pain.  Most likely associated with his CKD and CHF.  We will continue to monitor.  History of CVA:L ICA stent 2012. H/o PVD.S/p RFPBG in 2009 o.Continue aspirin, and Lipitor  Hypothyroidism: Continue thyroxine.  History of sick sinus syndrome: Status post pacemaker placement.  Continue metoprolol.  Descending thoracic aortic aneurysm: Infrarenal aortic artery aneurysm.  Size 5.5 cm.  Followed by Dr. [name]. Not a surgical candidate given high risk.   DVT prophylaxis: Heparin [place] Code Status: Full Family Communication: None present at the bedside  Disposition Plan: likely home after stabilization of COPD/CHF   Consultants: None  Procedures:None  Antimicrobials:Doxy  Subjective: Patient seen and examined the patient this morning.  Looks more comfortable today.  Respiratory status has improved.  Cough has improved.  I have  encouraged him to ambulate.   Objective: Vitals:   04/15/18 0529 04/15/18 0748 04/15/18 0816 04/15/18 1124  BP: (!) 151/96  (!) 149/81 131/82  Pulse: 61  63 80  Resp: 15  15 (!) 21  Temp: 97.6 F (36.4 C)  97.7 F (36.5 C) 98.2 F (36.8 C)  TempSrc: Oral  Oral Oral  SpO2: 100% 97% 97% 94%  Weight:      Height:        Intake/Output Summary (Last 24 hours) at 04/15/2018 1257 Last data filed at 04/15/2018 1100 Gross per 24 hour  Intake 480 ml  Output 750 ml  Net -270 ml   Filed Weights   04/13/18 2020 04/14/18 0633 04/15/18 0500  Weight: 68.2 kg (150 lb 5.7 oz) 68 kg (149 lb 14.6 oz) 68.1 kg (150 lb 2.1 oz)    Examination:  General exam:Not in distress,thin built, elderly male HEENT:PERRL,Oral mucosa moist, Ear/Nose normal on gross exam Respiratory system: Bilateral decreased air entry Cardiovascular system: S1 & S2 heard, RRR. No JVD, murmurs, rubs, gallops or clicks. 1-2 + pedal edema. Gastrointestinal system: Abdomen is nondistended, soft and nontender. No organomegaly or masses felt. Normal bowel sounds heard. Central nervous system: Alert and oriented. No focal neurological deficits. Extremities: 1-2 + pedal edema, no clubbing ,no cyanosis, distal peripheral pulses palpable. Skin: No rashes, lesions or ulcers,no icterus ,no pallor MSK: Normal muscle bulk,tone ,power Psychiatry: Judgement and insight appear normal. Mood & affect appropriate.     Data Reviewed: I have personally reviewed following labs and imaging studies  CBC: Recent Labs  Lab 04/13/18 1500 04/13/18 1515  WBC 8.8  --   NEUTROABS 6.4  --   HGB 13.3 13.3  HCT 39.9 39.0  MCV 94.8  --   PLT 104*  --    Basic Metabolic Panel: Recent Labs  Lab 04/13/18 1500 04/13/18 1515 04/14/18 0254 04/15/18 0639  NA 144 146* 145 144  K 4.0 4.0 4.8 5.1  CL 115* 114* 115* 112*  CO2 20*  --  20* 23  GLUCOSE 133* 131* 182* 119*  BUN 26* 27* 29* 47*  CREATININE 2.34* 2.20* 2.44* 2.72*  CALCIUM 8.3*  --   8.2* 8.2*  MG  --   --  2.4  --    GFR: Estimated Creatinine Clearance: 17.7 mL/min (A) (by C-G formula based on SCr of 2.72 mg/dL (H)). Liver Function Tests: Recent Labs  Lab 04/13/18 1500  AST 19  ALT 15*  ALKPHOS 83  BILITOT 1.5*  PROT 5.9*  ALBUMIN 3.1*   No results for input(s): LIPASE, AMYLASE in the last 168 hours. No results for input(s): AMMONIA in the last 168 hours. Coagulation Profile: No results for input(s): INR, PROTIME in the last 168 hours. Cardiac Enzymes: Recent Labs  Lab 04/15/18 0639  TROPONINI 0.25*   BNP (last 3 results) No results for input(s): PROBNP in the last 8760 hours. HbA1C: No results for input(s): HGBA1C in the last 72 hours. CBG: No results for input(s): GLUCAP in the last 168 hours. Lipid Profile: No results for input(s): CHOL, HDL, LDLCALC, TRIG, CHOLHDL, LDLDIRECT in the last 72 hours. Thyroid Function Tests: Recent Labs    04/14/18 0254  TSH 0.907   Anemia Panel: No results for input(s): VITAMINB12, FOLATE, FERRITIN, TIBC, IRON, RETICCTPCT in the last 72 hours. Sepsis  Labs: No results for input(s): PROCALCITON, LATICACIDVEN in the last 168 hours.  No results found for this or any previous visit (from the past 240 hour(s)).       Radiology Studies: Dg Chest Port 1 View  Result Date: 04/13/2018 CLINICAL DATA:  Shortness of breath and nonproductive cough. EXAM: PORTABLE CHEST 1 VIEW COMPARISON:  CT chest and chest x-ray dated March 16, 2018. FINDINGS: Unchanged left chest wall pacemaker. Stable cardiomegaly status post CABG. Increased interstitial markings bilaterally. Unchanged descending thoracic aortic aneurysm. Slightly worsened aeration of the left lower lobe with interval increase in size of left pleural effusion. Interstitial opacities at the right lung base and small right pleural effusion are not significantly changed. No acute osseous abnormality. IMPRESSION: 1. Stable cardiomegaly with worsening pulmonary interstitial  edema. 2. Increasing left pleural effusion with worsening aeration of the left lower lobe which could reflect pneumonia or atelectasis. 3. Stable interstitial changes at the right lung base and small right pleural effusion. Electronically Signed   By: Obie Dredge M.D.   On: 04/13/2018 15:32        Scheduled Meds: . amLODipine  10 mg Oral Daily  . aspirin  81 mg Oral Daily  . atorvastatin  40 mg Oral q1800  . benzonatate  200 mg Oral TID  . budesonide (PULMICORT) nebulizer solution  0.25 mg Nebulization BID  . doxycycline  100 mg Oral Q12H  . furosemide  20 mg Intravenous Q12H  . guaiFENesin  600 mg Oral BID  . heparin  5,000 Units Subcutaneous Q8H  . ipratropium-albuterol  3 mL Nebulization TID  . isosorbide mononitrate  15 mg Oral Daily  . metoprolol succinate  25 mg Oral BID  . mirtazapine  15 mg Oral QHS  . nicotine  14 mg Transdermal Daily  . potassium chloride SA  20 mEq Oral Daily  . predniSONE  50 mg Oral Q breakfast  . sodium chloride flush  3 mL Intravenous Q12H  . spironolactone  12.5 mg Oral Daily  . tamsulosin  0.4 mg Oral PC supper   Continuous Infusions: . sodium chloride       LOS: 2 days    Time spent: 25 mins.More than 50% of that time was spent in counseling and/or coordination of care.      Burnadette Pop, MD [facility] Pager [phone]  If 7PM-7AM, please contact night-coverage [email or web address] Password [facility] 04/15/2018, 12:57 PM

## 2018-04-15 NOTE — Progress Notes (Signed)
CRITICAL VALUE ALERT  Critical Value:  Troponin 0.25  Date & Time Notied:  04/15/18, 0813  Provider Notified: Adhikari  Orders Received/Actions taken: none at this time

## 2018-04-16 LAB — BASIC METABOLIC PANEL
ANION GAP: 8 (ref 5–15)
BUN: 51 mg/dL — ABNORMAL HIGH (ref 6–20)
CHLORIDE: 114 mmol/L — AB (ref 101–111)
CO2: 23 mmol/L (ref 22–32)
Calcium: 8.4 mg/dL — ABNORMAL LOW (ref 8.9–10.3)
Creatinine, Ser: 2.8 mg/dL — ABNORMAL HIGH (ref 0.61–1.24)
GFR calc non Af Amer: 19 mL/min — ABNORMAL LOW (ref >60)
GFR, EST AFRICAN AMERICAN: 22 mL/min — AB (ref >60)
Glucose, Bld: 144 mg/dL — ABNORMAL HIGH (ref 65–99)
POTASSIUM: 4.9 mmol/L (ref 3.5–5.1)
Sodium: 145 mmol/L (ref 135–145)

## 2018-04-16 MED ORDER — GUAIFENESIN ER 600 MG PO TB12: 600.0000 mg | ORAL_TABLET | Freq: Two times a day (BID) | ORAL | 0 refills | Status: AC

## 2018-04-16 MED ORDER — DOXYCYCLINE HYCLATE 100 MG PO TABS: 100.0000 mg | ORAL_TABLET | Freq: Two times a day (BID) | ORAL | 0 refills | Status: AC

## 2018-04-16 MED ORDER — ALBUTEROL SULFATE HFA 108 (90 BASE) MCG/ACT IN AERS: 2.0000 | INHALATION_SPRAY | Freq: Four times a day (QID) | RESPIRATORY_TRACT | 0 refills | Status: AC | PRN

## 2018-04-16 MED ORDER — IPRATROPIUM-ALBUTEROL 0.5-2.5 (3) MG/3ML IN SOLN: 3.0000 mL | Freq: Four times a day (QID) | RESPIRATORY_TRACT | 0 refills | Status: AC | PRN

## 2018-04-16 MED ORDER — FLUTICASONE-SALMETEROL 250-50 MCG/DOSE IN AEPB: 1.0000 | INHALATION_SPRAY | Freq: Two times a day (BID) | RESPIRATORY_TRACT | 0 refills | Status: AC

## 2018-04-16 MED ORDER — PREDNISONE 10 MG PO TABS: ORAL_TABLET | ORAL | 0 refills | Status: AC

## 2018-04-16 NOTE — Progress Notes (Signed)
SATURATION QUALIFICATIONS: (This note is used to comply with regulatory documentation for home oxygen)  Patient Saturations on Room Air at Rest = 92%  Patient Saturations on Room Air while Ambulating = 87%  Patient Saturations on 2 Liters of oxygen while Ambulating = 94%  Please briefly explain why patient needs home oxygen: pt desats and gets SOB with ambulation.

## 2018-04-16 NOTE — Discharge Summary (Signed)
Physician Discharge Summary  [name] VHQ:[id number] DOB: 14-Feb-1927 DOA: 04/13/2018  PCP: [facility]  Admit date: 04/13/2018 Discharge date: 04/16/2018  Admitted From: Home Disposition:  Home  Discharge Condition:Stable CODE STATUS:FULL Diet recommendation: Heart Healthy  Brief/Interim Summary:  Patient is a 84 year old male with past mental history of COPD, ongoing tobacco abuse, diastolic CHF, coronary artery disease, stroke, peripheral artery disease who presented to the emergency department with shortness of breath and cough with  yellow sputum.  Patient was admitted for the COPD exacerbation.  He had to be put on BiPAP for worsening shortness of breath on admission but ultimately tolerated off it. Patient's hospital course remained stable.  His respiratory status gradually improved.  He was started on IV steroids, bronchodilators and antibiotics.  Patient qualified for home oxygen.  Home oxygen arranged.  Patient has been smoking for several decades.  Counseled for smoking cessation. He needs to follow-up with pulmonology and nephrology as an outpatient. Due to issues with noncompliance and continued tobacco use, he has high risk for readmission in the future.   Following problems were addressed during his hospitalization:   Acute hypoxic respiratory failure: Secondary to COPD exacerbation.  Continue supplemental oxygen as needed.  Continue bronchodilators and steroids.  Currently off BiPAP .  COPD exacerbation: Continue oxygen as needed, bronchodilators, steroids.  Also started on doxycycline.  Patient continues to smoke. Counseled for smoking cessation. He has an appointment with pulmonology on June 5 with Dr. Sherene Sires. He qualified for home oxygen.  We will also tried to arrange home nebulization machine on discharge.  Prescribed inhalers on discharge.  Heart failure with preserved ejection fraction: Patient has left-sided pleural effusion.   Ejection fraction was 55 to 60% as per last echocardiogram.  Start on Lasix 20 mg every 12 hours here ,discontiued on discharge day due to worsened kidney function.  Continue Aldactone.  Patient also on isosorbide and metoprolol. He also was found to be have left-sided pleural effusion on admission but I dont think he needs thoracentesis at present.This can be followed as an outpatient .his respiratory status improved with diuresis. Elevated BNP.  Tobacco abuse: Continues to smoke.  Counseled for smoking cessation.  Lung nodule: CT chest done on last admission showed enlarging nodular density in the peripheral right upper lobe measuring 14 x 9 mm" as well as "an additional new nodule in the right upper lobe measuring 4.4 mm",-Interventional radiology consulted-felt high risk for procedure-,patient previously advised to follow-up with pulmonology for outpatient PET scan. Patient has an appointment with pulmonology on June 5.  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   Avoid nephrotoxic agents.Creatinine slightly got worsened today.   Looks like he has not been evaluated by nephrology in the past.  He should follow-up on discharge with nephrology .  hypertension: Continue current medications.  Blood pressure currently stable.  History of coronary disease/elevated troponin: Status post CABG x4 2000 - DES to SVG/OM 2015. Currently chest pain-free.  Continue home meds. Troponin found to be elevated on presentation.  Patient does not have any chest pain.  Most likely associated with his CKD and CHF.  We will continue to monitor.  History of CVA:L ICA stent 2012. H/o PVD.S/p RFPBG in 2009o.Continue aspirin, and Lipitor  Hypothyroidism: Continue thyroxine.  History of sick sinus syndrome: Status post pacemaker placement.  Continue metoprolol.  Descending thoracic aortic aneurysm: Infrarenal aortic artery aneurysm.  Size 5.5 cm.  Followed by Dr. [name]. Not a surgical  candidate given high risk.     Discharge Diagnoses:  Principal Problem:   COPD with acute exacerbation (HCC) Active Problems:   HTN (hypertension)   Tobacco abuse   History of CVA (cerebrovascular accident)   CAD (coronary artery disease) of artery bypass graft   Sick sinus syndrome (HCC) s/p Med[employer] PPM   ICH (intracerebral hemorrhage) (HCC)   Coronary artery disease involving native coronary artery of native heart with angina pectoris (HCC)   Thoracic aortic aneurysm without rupture (HCC)   (HFpEF) heart failure with preserved ejection fraction with Exacerbation   Mass of upper lobe of right lung   Hypoxia    Discharge Instructions  Discharge Instructions    Ambulatory referral to Nephrology   Complete by:  As directed    Diet - low sodium heart healthy   Complete by:  As directed    Discharge instructions   Complete by:  As directed    1) Please take prescribed medications as instructed. 2) Follow up with your PCP in a week. Do a CBC and BMP tests during the follow up. 3) Follow up with pulmonology for the evaluation of COPD and lung nodule. You have appointment with Dr. Sherene Sires on June 5. Name and number of the provider has been attached. 4) Follow up with nephrology as an outpatient.Name and number of the provider has been attached. 5) Please stop smoking.   Increase activity slowly   Complete by:  As directed      Allergies as of 04/16/2018      Reactions   Tramadol Nausea And Vomiting   Other reaction(s): Other (See Comments) unknown   Pravachol Other (See Comments)   myalgias   Pravastatin Other (See Comments)   myalgias   Simvastatin Other (See Comments)   myalgia   Other Other (See Comments)   Other reaction(s): Other (See Comments) GREEN BEANS AND ONIONS GREEN BEANS AND ONIONS   Oxycodone Other (See Comments)   Other reaction(s): Other (See Comments) Family member reports "he was highly agitated and disoriented" unknown Family member reports "he  was highly agitated and disoriented"   Terazosin Other (See Comments)   Other reaction(s): Other (See Comments) unknown unknown      Medication List    TAKE these medications   acetaminophen 325 MG tablet Commonly known as:  TYLENOL Take 650 mg by mouth every 6 (six) hours as needed for mild pain. Reported on 11/25/2015   albuterol 108 (90 Base) MCG/ACT inhaler Commonly known as:  PROVENTIL HFA;VENTOLIN HFA Inhale 2 puffs into the lungs every 6 (six) hours as needed for wheezing or shortness of breath.   amLODipine 10 MG tablet Commonly known as:  NORVASC Take 10 mg by mouth daily.   aspirin 81 MG tablet Take 81 mg by mouth daily.   atorvastatin 40 MG tablet Commonly known as:  LIPITOR Take 1 tablet (40 mg total) by mouth daily at 6 PM.   benzonatate 200 MG capsule Commonly known as:  TESSALON Take 1 capsule (200 mg total) by mouth 3 (three) times daily as needed (refractory cough).   doxycycline 100 MG tablet Commonly known as:  VIBRA-TABS Take 1 tablet (100 mg total) by mouth every 12 (twelve) hours for 2 days.   Fluticasone-Salmeterol 250-50 MCG/DOSE Aepb Commonly known as:  ADVAIR DISKUS Inhale 1 puff into the lungs 2 (two) times daily.   guaiFENesin 600 MG 12 hr tablet Commonly known as:  MUCINEX Take 1 tablet (600 mg total) by mouth 2 (two)  times daily.   ipratropium-albuterol 0.5-2.5 (3) MG/3ML Soln Commonly known as:  DUONEB Take 3 mLs by nebulization every 6 (six) hours as needed.   isosorbide mononitrate 30 MG 24 hr tablet Commonly known as:  IMDUR Take 0.5 tablets (15 mg total) by mouth daily.   metoprolol succinate 25 MG 24 hr tablet Commonly known as:  TOPROL XL Take 1 tablet (25 mg total) by mouth 2 (two) times daily.   mirtazapine 15 MG tablet Commonly known as:  REMERON Take 15 mg by mouth at bedtime.   nitroGLYCERIN 0.4 MG SL tablet Commonly known as:  NITROSTAT Place 1 tablet (0.4 mg total) under the tongue every 5 (five) minutes as  needed. For chest pain   potassium chloride SA 20 MEQ tablet Commonly known as:  K-DUR,KLOR-CON Take 1 tablet (20 mEq total) by mouth daily.   predniSONE 10 MG tablet Commonly known as:  DELTASONE Take 4 pills once a day for 3 days then 3 pills once a day for 3 days then 2 pills once a day for 3 days then 1 pill once a day for 3 days then stop. What changed:    medication strength  how much to take  how to take this  when to take this  additional instructions   spironolactone 25 MG tablet Commonly known as:  ALDACTONE Take 12.5 mg by mouth daily.   tamsulosin 0.4 MG Caps capsule Commonly known as:  FLOMAX Take 0.4 mg by mouth daily after supper.            Durable Medical Equipment  (From admission, onward)        Start     Ordered   04/16/18 1033  For home use only DME Nebulizer/meds  Once    Question:  Patient needs a nebulizer to treat with the following condition  Answer:  COPD (chronic obstructive pulmonary disease) (HCC)   04/16/18 1033   04/16/18 1018  For home use only DME oxygen  Once    Question Answer Comment  Mode or (Route) Nasal cannula   Liters per Minute 2   Frequency Continuous (stationary and portable oxygen unit needed)   Oxygen delivery system Gas      04/16/18 1017     Follow-up Information    Lars Masson, MD. Schedule an appointment as soon as possible for a visit in 4 week(s).   Specialty:  Cardiology Contact information: [address] [place] [address] [phone]        Nyoka Cowden, MD. Go on 05/10/2018.   Specialty:  Pulmonary Disease Why:  You have an appointment on this date Contact information: [address] [place] [address] [phone]        Maxie Barb, MD. Schedule an appointment as soon as possible for a visit in 1 week(s).   Specialties:  Nephrology, Internal Medicine Contact information: [address] [place] [address] [phone]           Allergies  Allergen Reactions  . Tramadol Nausea And Vomiting    Other reaction(s): Other (See Comments) unknown  . Pravachol Other (See Comments)    myalgias  . Pravastatin Other (See Comments)    myalgias  . Simvastatin Other (See Comments)    myalgia  . Other Other (See Comments)    Other reaction(s): Other (See Comments) GREEN BEANS AND ONIONS GREEN BEANS AND ONIONS  . Oxycodone Other (See Comments)    Other reaction(s): Other (See Comments)  Family member reports "he was highly agitated and disoriented" unknown Family member reports "he was highly agitated and disoriented"   . Terazosin Other (See Comments)    Other reaction(s): Other (See Comments) unknown unknown    Consultations:  None   Procedures/Studies: Dg Chest Port 1 View  Result Date: 04/13/2018 CLINICAL DATA:  Shortness of breath and nonproductive cough. EXAM: PORTABLE CHEST 1 VIEW COMPARISON:  CT chest and chest x-ray dated March 16, 2018. FINDINGS: Unchanged left chest wall pacemaker. Stable cardiomegaly status post CABG. Increased interstitial markings bilaterally. Unchanged descending thoracic aortic aneurysm. Slightly worsened aeration of the left lower lobe with interval increase in size of left pleural effusion. Interstitial opacities at the right lung base and small right pleural effusion are not significantly changed. No acute osseous abnormality. IMPRESSION: 1. Stable cardiomegaly with worsening pulmonary interstitial edema. 2. Increasing left pleural effusion with worsening aeration of the left lower lobe which could reflect pneumonia or atelectasis. 3. Stable interstitial changes at the right lung base and small right pleural effusion. Electronically Signed   By: Obie Dredge M.D.   On: 04/13/2018 15:32       Subjective: Patient seen and examined the bedside this morning.  Looked comfortable today.  He was able to ambulate without assistance but desaturated on ambulation and qualify for home  oxygen.  Stable for discharge to home today.  He needs to follow-up with outpatient appointments.  Discussed the discharge plan with the daughter on phone.  Discharge Exam: Vitals:   04/16/18 0740 04/16/18 0757  BP: (!) 147/86   Pulse: 83   Resp: 15   Temp: 97.8 F (36.6 C)   SpO2: 97% 96%   Vitals:   04/16/18 0341 04/16/18 0552 04/16/18 0740 04/16/18 0757  BP: (!) 142/83  (!) 147/86   Pulse: 73  83   Resp: 18  15   Temp: 97.6 F (36.4 C)  97.8 F (36.6 C)   TempSrc: Oral  Oral   SpO2: 97%  97% 96%  Weight:  63.4 kg (139 lb 12.8 oz)    Height:        General: Pt is alert, awake, not in acute distress Cardiovascular: RRR, S1/S2 +, no rubs, no gallops Respiratory: Mildly decreased bilateral air entry  abdominal: Soft, NT, ND, bowel sounds + Extremities:Trace edema on lower legs, suspicious for myxedema more than fluid edema ,no cyanosis    The results of significant diagnostics from this hospitalization (including imaging, microbiology, ancillary and laboratory) are listed below for reference.     Microbiology: No results found for this or any previous visit (from the past 240 hour(s)).   Labs: BNP (last 3 results) Recent Labs    03/12/18 0627 03/16/18 1138 04/13/18 1444  BNP 1,407.8* 2,630.6* 2,724.6*   Basic Metabolic Panel: Recent Labs  Lab 04/13/18 1500 04/13/18 1515 04/14/18 0254 04/15/18 0639 04/16/18 0335  NA 144 146* 145 144 145  K 4.0 4.0 4.8 5.1 4.9  CL 115* 114* 115* 112* 114*  CO2 20*  --  20* 23 23  GLUCOSE 133* 131* 182* 119* 144*  BUN 26* 27* 29* 47* 51*  CREATININE 2.34* 2.20* 2.44* 2.72* 2.80*  CALCIUM 8.3*  --  8.2* 8.2* 8.4*  MG  --   --  2.4  --   --    Liver Function Tests: Recent Labs  Lab 04/13/18 1500  AST 19  ALT 15*  ALKPHOS 83  BILITOT 1.5*  PROT 5.9*  ALBUMIN 3.1*   No  results for input(s): LIPASE, AMYLASE in the last 168 hours. No results for input(s): AMMONIA in the last 168 hours. CBC: Recent Labs  Lab  04/13/18 1500 04/13/18 1515  WBC 8.8  --   NEUTROABS 6.4  --   HGB 13.3 13.3  HCT 39.9 39.0  MCV 94.8  --   PLT 104*  --    Cardiac Enzymes: Recent Labs  Lab 04/15/18 0639  TROPONINI 0.25*   BNP: Invalid input(s): POCBNP CBG: No results for input(s): GLUCAP in the last 168 hours. D-Dimer No results for input(s): DDIMER in the last 72 hours. Hgb A1c No results for input(s): HGBA1C in the last 72 hours. Lipid Profile No results for input(s): CHOL, HDL, LDLCALC, TRIG, CHOLHDL, LDLDIRECT in the last 72 hours. Thyroid function studies Recent Labs    04/14/18 0254  TSH 0.907   Anemia work up No results for input(s): VITAMINB12, FOLATE, FERRITIN, TIBC, IRON, RETICCTPCT in the last 72 hours. Urinalysis    Component Value Date/Time   COLORURINE STRAW (A) 05/31/2017 1252   APPEARANCEUR CLEAR 05/31/2017 1252   LABSPEC 1.006 05/31/2017 1252   PHURINE 8.0 05/31/2017 1252   GLUCOSEU NEGATIVE 05/31/2017 1252   HGBUR MODERATE (A) 05/31/2017 1252   BILIRUBINUR NEGATIVE 05/31/2017 1252   KETONESUR NEGATIVE 05/31/2017 1252   PROTEINUR 30 (A) 05/31/2017 1252   UROBILINOGEN 1.0 08/10/2015 2242   NITRITE NEGATIVE 05/31/2017 1252   LEUKOCYTESUR NEGATIVE 05/31/2017 1252   Sepsis Labs Invalid input(s): PROCALCITONIN,  WBC,  LACTICIDVEN Microbiology No results found for this or any previous visit (from the past 240 hour(s)).   Time coordinating discharge: 35 minutes  SIGNED:   Burnadette Pop, MD  [facility] 04/16/2018, 10:46 AM Pager [phone]  If 7PM-7AM, please contact night-coverage [email or web address] Password [email or web address]

## 2018-04-16 NOTE — Progress Notes (Signed)
Pt dischaged to home, pt insisted on ambulating to car states that "I promised myself as long as I was able I would walk out of the hospital" pt also refusing to wear the home o2, stating that if he needs it he will wear it. Pt visibly SOB when got to car, RN encouraged pt to put on o2, pt agreed stating he would put it on long enough to catch his breath, discharge paperwork reviewed with pt prior to discharge, no questions at this time.

## 2018-04-16 NOTE — Progress Notes (Signed)
Patient discharging home today need home oxygen; Jermane with [facility] called; a portable oxygen tank will be delivered to the room prior to discharging home and more oxygen tanks will be delivered to the home. Abelino Derrick [facility] [phone]

## 2018-04-28 ENCOUNTER — Ambulatory Visit: Payer: Medicare Other | Admitting: Physician Assistant

## 2018-05-06 DEATH — deceased

## 2018-05-10 ENCOUNTER — Institutional Professional Consult (permissible substitution): Payer: Medicare Other | Admitting: Internal Medicine

## 2018-06-26 ENCOUNTER — Other Ambulatory Visit: Payer: Self-pay | Admitting: Internal Medicine
